# Patient Record
Sex: Female | Born: 1955 | Race: Black or African American | Hispanic: No | Smoking: Current every day smoker
Health system: Southern US, Community
[De-identification: ages and names within clinical notes are randomized; demographics above are authoritative.]

## PROBLEM LIST (undated history)

## (undated) DIAGNOSIS — E785 Hyperlipidemia, unspecified: Secondary | ICD-10-CM

## (undated) DIAGNOSIS — I10 Essential (primary) hypertension: Secondary | ICD-10-CM

## (undated) DIAGNOSIS — Z9289 Personal history of other medical treatment: Secondary | ICD-10-CM

## (undated) DIAGNOSIS — M479 Spondylosis, unspecified: Secondary | ICD-10-CM

## (undated) DIAGNOSIS — Z970 Presence of artificial eye: Secondary | ICD-10-CM

## (undated) DIAGNOSIS — E119 Type 2 diabetes mellitus without complications: Secondary | ICD-10-CM

## (undated) DIAGNOSIS — M47815 Spondylosis without myelopathy or radiculopathy, thoracolumbar region: Secondary | ICD-10-CM

## (undated) DIAGNOSIS — K219 Gastro-esophageal reflux disease without esophagitis: Secondary | ICD-10-CM

## (undated) HISTORY — PX: APPENDECTOMY: SHX54

## (undated) HISTORY — DX: Presence of artificial eye: Z97.0

## (undated) HISTORY — PX: COLONOSCOPY: SHX174

## (undated) HISTORY — PX: EYE SURGERY: SHX253

## (undated) HISTORY — PX: LAPAROSCOPIC CHOLECYSTECTOMY: SUR755

## (undated) HISTORY — PX: DILATION AND CURETTAGE OF UTERUS: SHX78

## (undated) HISTORY — PX: SHOULDER ARTHROSCOPY WITH ROTATOR CUFF REPAIR: SHX5685

---
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
## 1998-11-20 ENCOUNTER — Emergency Department (HOSPITAL_COMMUNITY): Admission: EM | Admit: 1998-11-20 | Discharge: 1998-11-20 | Payer: Self-pay | Admitting: Emergency Medicine

## 1998-11-21 ENCOUNTER — Encounter: Payer: Self-pay | Admitting: Emergency Medicine

## 1999-03-10 ENCOUNTER — Emergency Department (HOSPITAL_COMMUNITY): Admission: EM | Admit: 1999-03-10 | Discharge: 1999-03-10 | Payer: Self-pay | Admitting: Emergency Medicine

## 1999-04-02 ENCOUNTER — Emergency Department (HOSPITAL_COMMUNITY): Admission: EM | Admit: 1999-04-02 | Discharge: 1999-04-02 | Payer: Self-pay | Admitting: Emergency Medicine

## 2000-03-03 ENCOUNTER — Encounter: Admission: RE | Admit: 2000-03-03 | Discharge: 2000-04-07 | Payer: Self-pay | Admitting: Orthopedic Surgery

## 2001-06-18 ENCOUNTER — Emergency Department (HOSPITAL_COMMUNITY): Admission: EM | Admit: 2001-06-18 | Discharge: 2001-06-18 | Payer: Self-pay | Admitting: Emergency Medicine

## 2001-10-05 ENCOUNTER — Emergency Department (HOSPITAL_COMMUNITY): Admission: EM | Admit: 2001-10-05 | Discharge: 2001-10-05 | Payer: Self-pay | Admitting: Emergency Medicine

## 2001-10-10 ENCOUNTER — Emergency Department (HOSPITAL_COMMUNITY): Admission: EM | Admit: 2001-10-10 | Discharge: 2001-10-10 | Payer: Self-pay | Admitting: Emergency Medicine

## 2003-05-04 ENCOUNTER — Encounter: Payer: Self-pay | Admitting: Emergency Medicine

## 2003-05-04 ENCOUNTER — Emergency Department (HOSPITAL_COMMUNITY): Admission: EM | Admit: 2003-05-04 | Discharge: 2003-05-04 | Payer: Self-pay | Admitting: Emergency Medicine

## 2005-03-12 ENCOUNTER — Inpatient Hospital Stay (HOSPITAL_COMMUNITY): Admission: AD | Admit: 2005-03-12 | Discharge: 2005-03-13 | Payer: Self-pay | Admitting: Internal Medicine

## 2005-03-12 ENCOUNTER — Encounter: Payer: Self-pay | Admitting: Emergency Medicine

## 2005-05-20 ENCOUNTER — Emergency Department (HOSPITAL_COMMUNITY): Admission: EM | Admit: 2005-05-20 | Discharge: 2005-05-21 | Payer: Self-pay | Admitting: Emergency Medicine

## 2006-02-08 ENCOUNTER — Emergency Department (HOSPITAL_COMMUNITY): Admission: EM | Admit: 2006-02-08 | Discharge: 2006-02-08 | Payer: Self-pay | Admitting: Emergency Medicine

## 2007-07-03 ENCOUNTER — Inpatient Hospital Stay (HOSPITAL_COMMUNITY): Admission: EM | Admit: 2007-07-03 | Discharge: 2007-07-04 | Payer: Self-pay | Admitting: Emergency Medicine
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
## 2011-04-19 NOTE — Consult Note (Signed)
NAMEBULAR, HICKOK NO.:  [id number]   MEDICAL RECORD NO.:  [id number]          PATIENT TYPE:  INP   LOCATION:  1437                         FACILITY:  [facility]   PHYSICIAN:  Antonietta Breach, M.D.  DATE OF BIRTH:  13-May-1956   DATE OF CONSULTATION:  07/03/2007  DATE OF DISCHARGE:                                 CONSULTATION   HISTORY OF PRESENT ILLNESS:  [name] is a 55 year old female  admitted to the [facility] on the 28th of July 2008 after an  overdose.   The patient states that she drank several beers.  Had argument with her  boyfriend.  She then ate a piece of cocaine and drink Chlortox.   After getting over her intoxication and spending time in the hospital  today, she looks back with a sense of disbelief that she actually  behaved in such a fashion.  She has stated that she must have been  trying to kill herself because of her behavior and the fact that the  overdose occurred; however, she realizes that she was irrational and  impulsive at the time due to intoxication.   The patient currently describes constructive goals and future interests.  She has no thoughts of harming herself or others.  She has no delusions  or hallucinations.  She has had some discussions with her ex-boyfriend  on the phone today.  They have ended their relationship, and she is at  peace about that.   PAST PSYCHIATRIC HISTORY:  The patient does have a history of former  alcohol abuse.  She has been combative and intoxicated when under the  influence of alcohol including a documented episode in the past medical  record in April 2006.   FAMILY PSYCHIATRIC HISTORY:  None.   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  She stopped working as  an Astronomer.  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  She does have a history of using crack about once a month.  She  states she drinks about a 24 oz can of beer a day on the average.  She  is widowed.   PAST MEDICAL HISTORY:  Peptic ulcer disease, history of seizures, carpal  tunnel syndrome, right eye blindness due to trauma at the age of [age].   ALLERGIES:  PENICILLIN, ASPIRIN, SULFA.   MEDICATIONS:  MAR is reviewed.  The patient is on thiamine, a  multivitamin, folic acid.   REVIEW OF SYSTEMS:  Noncontributory.   MENTAL STATUS EXAM:  Shari [name] is alert and oriented to all spheres.  Her memory is intact to immediate recent and remote.  Her affect is  broad and appropriate; mood is within normal limits.  Speech is normal.  Thought process logical, coherent, goal-directed.  No looseness of  associations.  Thought content:  No thoughts of harming herself, no  thoughts of harming others, no delusions, no hallucinations.  Her  judgment is intact.  Her insight is intact.  ASSESSMENT:  AXIS I:  Adjustment disorder with mixed disturbance of  emotions and conduct, alcohol dependence without physiologic dependence,  rule out polysubstance dependence.  AXIS II:  Deferred.  AXIS III:  See general medical problems.  AXIS IV:  Primary support group.  AXIS V:  55.   Shari [name] is not at risk to harm herself or others.  She agrees to call  emergency services immediately for any thoughts of harming herself,  thoughts of harming others or distress.   The patient declines a CD inpatient residential rehabilitation  recommendation, and she is not committable.  She wants to follow up at  [facility] outpatient program [place].   RECOMMENDATIONS:  1. Outpatient ADS.  2. 12-step groups.  If the patient changes her mind about CD      inpatient, would contact the [place] program downtown.      Antonietta Breach, M.D.  Electronically Signed     JW/MEDQ  D:  07/03/2007  T:  07/04/2007  Job:  [id number]

## 2011-04-19 NOTE — Discharge Summary (Signed)
[name], [name]                 ACCOUNT NO.:  [id number]   MEDICAL RECORD NO.:  [id number]          PATIENT TYPE:  INP   LOCATION:  1437                         FACILITY:  [facility]   PHYSICIAN:  Wilson Singer, M.D.DATE OF BIRTH:  Jul 15, 1956   DATE OF ADMISSION:  07/02/2007  DATE OF DISCHARGE:  07/04/2007                               DISCHARGE SUMMARY   FINAL DIAGNOSES:  1. Suicide attempt.  2. History of seizure disorder.  3. Microcytic anemia, needs further investigation.  4. Alcohol abuse.   MEDICATIONS ON DISCHARGE:  Ferrous sulfate 325 mg b.i.d.   CONDITION ON DISCHARGE:  Stable.   HISTORY:  This 55 year old lady was admitted, having taken a rock of  crack cocaine together with alcohol and Clorox. Please see initial  history and physical examination done by Dr. Michaelyn Barter.   HOSPITAL PROGRESS:  As far as her overdose was concerned, she was  clinically and medically stable. She did not have any adverse  consequences, and she was keep on intravenous fluids and became less  drowsy as time went on throughout her hospitalization. It was noted,  however, that she did have a microcytic anemia with a hemoglobin of 8  with a MCV of 67. Hemoccult stools were not available at the time of  discharge, and iron studies have not been done. She was seen by the  psychiatrist, Dr. Jeanie Sewer, who felt that she does have polysubstance  dependence, but she was not suicidal, and he cleared her for discharge  and gave her followup measures.   On physical examination today, she looks well. Temperature 98.3, blood  pressure 119/59, pulse 79, saturation 100% on room air. There were no  new physical findings. The patient does look pale.   Further disposition, she can be discharged home. [name] not actively  bleeding, but she does have a microcytic anemia which requires  investigating, probably initially with a colonoscopy. I will defer this  to Dr. Lonia Blood who will hopefully be her  new primary care physician,  and I have given the patient his name.      Wilson Singer, M.D.  Electronically Signed     NCG/MEDQ  D:  07/04/2007  T:  07/05/2007  Job:  [id number]   cc:   Lonia Blood, M.D.
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
## 2011-09-19 LAB — CBC
HCT: 23.9 — ABNORMAL LOW
HCT: 27.3 — ABNORMAL LOW
Hemoglobin: 8 — ABNORMAL LOW
Hemoglobin: 8.9 — ABNORMAL LOW
MCHC: 32.6
MCHC: 33.3
MCV: 67 — ABNORMAL LOW
MCV: 67.4 — ABNORMAL LOW
Platelets: 326
RBC: 3.57 — ABNORMAL LOW
RBC: 4.05
RDW: 20.7 — ABNORMAL HIGH
WBC: 4.8
WBC: 6.1

## 2011-09-19 LAB — PROTIME-INR
INR: 1.1
Prothrombin Time: 14.1

## 2011-09-19 LAB — DIFFERENTIAL
Basophils Absolute: 0
Basophils Relative: 0
Eosinophils Absolute: 0.1
Eosinophils Relative: 1
Lymphocytes Relative: 28
Lymphs Abs: 1.7
Monocytes Absolute: 0.5
Monocytes Relative: 8
Neutro Abs: 3.8
Neutrophils Relative %: 63

## 2011-09-19 LAB — COMPREHENSIVE METABOLIC PANEL
AST: 18
AST: 20
Albumin: 3.3 — ABNORMAL LOW
BUN: 1 — ABNORMAL LOW
CO2: 22
CO2: 23
Calcium: 8.5
Chloride: 103
Chloride: 107
Creatinine, Ser: 0.47
Creatinine, Ser: 0.55
GFR calc Af Amer: >60
GFR calc Af Amer: >60
GFR calc non Af Amer: >60
GFR calc non Af Amer: >60
Glucose, Bld: 96
Total Bilirubin: 0.5
Total Bilirubin: 0.6

## 2011-09-19 LAB — COMPREHENSIVE METABOLIC PANEL WITH GFR
ALT: 19
Alkaline Phosphatase: 68
Glucose, Bld: 105 — ABNORMAL HIGH
Potassium: 3.2 — ABNORMAL LOW
Sodium: 135
Total Protein: 6.6

## 2011-09-19 LAB — URINALYSIS, ROUTINE W REFLEX MICROSCOPIC
Bilirubin Urine: NEGATIVE
Glucose, UA: NEGATIVE
Ketones, ur: NEGATIVE
Nitrite: NEGATIVE
Protein, ur: NEGATIVE
Specific Gravity, Urine: 1.006
Urobilinogen, UA: 0.2
pH: 6.5

## 2011-09-19 LAB — PREALBUMIN: Prealbumin: 28.8

## 2011-09-19 LAB — CK TOTAL AND CKMB (NOT AT ARMC)
CK, MB: 1.7
Relative Index: 1.1
Total CK: 161

## 2011-09-19 LAB — RAPID URINE DRUG SCREEN, HOSP PERFORMED
Amphetamines: NOT DETECTED
Benzodiazepines: NOT DETECTED
Tetrahydrocannabinol: NOT DETECTED

## 2011-09-19 LAB — ACETAMINOPHEN LEVEL: Acetaminophen (Tylenol), Serum: <10 — ABNORMAL LOW

## 2011-09-19 LAB — URINE MICROSCOPIC-ADD ON

## 2011-09-19 LAB — ETHANOL: Alcohol, Ethyl (B): 179 — ABNORMAL HIGH

## 2011-09-19 LAB — TROPONIN I: Troponin I: 0.02

## 2011-09-19 LAB — HEMOGLOBIN AND HEMATOCRIT, BLOOD: HCT: 24.1 — ABNORMAL LOW

## 2011-09-19 LAB — SALICYLATE LEVEL: Salicylate Lvl: <4

## 2015-02-20 NOTE — Patient Instructions (Addendum)
   Your procedure is scheduled on:  Wednesday, March 23  [facility] at:  6 AM Pick up the phone at the desk and dial [phone] and inform us of your arrival.  Please call this number if you have any problems the morning of surgery: [phone]  Remember: Do not eat or drink after midnight: Tuesday Take these medicines the morning of surgery with a SIP OF WATER:  NONE  Do not wear jewelry, make-up, or FINGER nail polish No metal in your hair or on your body. Do not wear lotions, powders, perfumes.  You may wear deodorant.  Do not bring valuables to the hospital. Contacts, dentures or bridgework may not be worn into surgery.  Patients discharged on the day of surgery will not be allowed to drive home.  Home with sister [name] cell [phone] or Niece Lynford HumphreyShanda cell [phone]

## 2015-02-23 ENCOUNTER — Encounter (HOSPITAL_COMMUNITY): Payer: Self-pay

## 2015-02-23 ENCOUNTER — Encounter (HOSPITAL_COMMUNITY)
Admission: RE | Admit: 2015-02-23 | Discharge: 2015-02-23 | Disposition: A | Discharge status: Home / Self Care | Payer: Commercial Managed Care - HMO | Source: Ambulatory Visit | Attending: Obstetrics and Gynecology | Admitting: Obstetrics and Gynecology

## 2015-02-23 DIAGNOSIS — Z9089 Acquired absence of other organs: Secondary | ICD-10-CM | POA: Diagnosis not present

## 2015-02-23 DIAGNOSIS — F17200 Nicotine dependence, unspecified, uncomplicated: Secondary | ICD-10-CM | POA: Diagnosis not present

## 2015-02-23 DIAGNOSIS — N840 Polyp of corpus uteri: Secondary | ICD-10-CM | POA: Diagnosis not present

## 2015-02-23 DIAGNOSIS — Z9049 Acquired absence of other specified parts of digestive tract: Secondary | ICD-10-CM | POA: Diagnosis not present

## 2015-02-23 DIAGNOSIS — N950 Postmenopausal bleeding: Secondary | ICD-10-CM | POA: Diagnosis present

## 2015-02-23 HISTORY — DX: Hyperlipidemia, unspecified: E78.5

## 2015-02-23 LAB — CBC
HCT: 40.8 % (ref 36.0–46.0)
Hemoglobin: 13.5 g/dL (ref 12.0–15.0)
MCH: 28.5 pg (ref 26.0–34.0)
MCHC: 33.1 g/dL (ref 30.0–36.0)
MCV: 86.1 fL (ref 78.0–100.0)
PLATELETS: 293 10*3/uL (ref 150–400)
RBC: 4.74 MIL/uL (ref 3.87–5.11)
RDW: 13.6 % (ref 11.5–15.5)
WBC: 7.9 10*3/uL (ref 4.0–10.5)

## 2015-02-24 NOTE — H&P (Addendum)
Albin FellingBetty L Palmatier is an 59 y.o. female. Presents for surgical evaluation and management of PMB     No LMP recorded.    Past Medical History  Diagnosis Date  . SVD (spontaneous vaginal delivery)     x 1, baby died at 2 wks of age  . Hyperlipidemia     Past Surgical History  Procedure Laterality Date  . Eye surgery      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6, 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  . Appendectomy    . Cholecystectomy    . Right shoulder surgery      rotator cuff repair  . Left shoulder surgery      rotator cuff repair, wire stitches per patient  . Colonoscopy      No family history on file.  Social History:  reports that she has been smoking.  She has never used smokeless tobacco. She reports that she does not drink alcohol or use illicit drugs.  Allergies:  Allergies  Allergen Reactions  . Aspirin Hives and Itching  . Penicillins Hives and Itching  . Sulfa Antibiotics Hives and Itching    No prescriptions prior to admission    ROS  AF, VSS  Physical Exam Gen - NAD Abd - soft, NT/ND CV - RRR Lungs - clear Ext - NT PV - uterus enlarged, mobile, NT.  No adnexal masses  US - large submucus fibroid Embx - b9 but only endocervical tissue  Assessment/Plan:  PMB Hysteroscopy D&C, possible myomectomy R/b/a and plan of care reviewed.  Questions answered, informed consent Yatzari Jonsson 02/24/2015, 9:35 PM

## 2015-02-25 ENCOUNTER — Encounter (HOSPITAL_COMMUNITY)
Admission: AD | Disposition: A | Discharge status: Home / Self Care | Payer: Self-pay | Source: Ambulatory Visit | Attending: Obstetrics and Gynecology

## 2015-02-25 ENCOUNTER — Ambulatory Visit (HOSPITAL_COMMUNITY): Payer: Commercial Managed Care - HMO | Admitting: Anesthesiology

## 2015-02-25 ENCOUNTER — Ambulatory Visit (HOSPITAL_COMMUNITY)
Admission: AD | Admit: 2015-02-25 | Discharge: 2015-02-25 | Disposition: A | Discharge status: Home / Self Care | Payer: Commercial Managed Care - HMO | Source: Ambulatory Visit | Attending: Obstetrics and Gynecology | Admitting: Obstetrics and Gynecology

## 2015-02-25 ENCOUNTER — Encounter (HOSPITAL_COMMUNITY): Payer: Self-pay | Admitting: Anesthesiology

## 2015-02-25 DIAGNOSIS — Z9049 Acquired absence of other specified parts of digestive tract: Secondary | ICD-10-CM | POA: Diagnosis not present

## 2015-02-25 DIAGNOSIS — N840 Polyp of corpus uteri: Secondary | ICD-10-CM | POA: Diagnosis not present

## 2015-02-25 DIAGNOSIS — Z9089 Acquired absence of other organs: Secondary | ICD-10-CM | POA: Diagnosis not present

## 2015-02-25 DIAGNOSIS — F17200 Nicotine dependence, unspecified, uncomplicated: Secondary | ICD-10-CM | POA: Insufficient documentation

## 2015-02-25 HISTORY — PX: DILATATION & CURETTAGE/HYSTEROSCOPY WITH MYOSURE: SHX6511

## 2015-02-25 SURGERY — DILATATION & CURETTAGE/HYSTEROSCOPY WITH MYOSURE
Anesthesia: General | Site: Uterus

## 2015-02-25 MED ORDER — LIDOCAINE HCL (CARDIAC) 20 MG/ML IV SOLN
INTRAVENOUS | Status: DC | PRN
  Administered 2015-02-25: 60 mg via INTRAVENOUS

## 2015-02-25 MED ORDER — MIDAZOLAM HCL 2 MG/2ML IJ SOLN
INTRAMUSCULAR | Status: DC | PRN
  Administered 2015-02-25: 1 mg via INTRAVENOUS

## 2015-02-25 MED ORDER — KETOROLAC TROMETHAMINE 30 MG/ML IJ SOLN
INTRAMUSCULAR | Status: AC
  Filled 2015-02-25: qty 1

## 2015-02-25 MED ORDER — OXYCODONE-ACETAMINOPHEN 5-325 MG PO TABS: 1.0000 | ORAL_TABLET | ORAL | Status: DC | PRN

## 2015-02-25 MED ORDER — ACETAMINOPHEN 325 MG PO TABS: 650.0000 mg | ORAL_TABLET | Freq: Once | ORAL | Status: DC

## 2015-02-25 MED ORDER — FENTANYL CITRATE 0.05 MG/ML IJ SOLN: 25.0000 ug | INTRAMUSCULAR | Status: DC | PRN

## 2015-02-25 MED ORDER — OXYCODONE HCL 5 MG PO TABS
5.0000 mg | ORAL_TABLET | Freq: Once | ORAL | Status: AC
Start: 2015-02-25 — End: 2015-02-25
  Administered 2015-02-25: 5 mg via ORAL

## 2015-02-25 MED ORDER — LIDOCAINE HCL (CARDIAC) 20 MG/ML IV SOLN
INTRAVENOUS | Status: AC
  Filled 2015-02-25: qty 5

## 2015-02-25 MED ORDER — PROPOFOL 10 MG/ML IV BOLUS
INTRAVENOUS | Status: DC | PRN
  Administered 2015-02-25: 40 mg via INTRAVENOUS

## 2015-02-25 MED ORDER — FENTANYL CITRATE 0.05 MG/ML IJ SOLN
INTRAMUSCULAR | Status: DC | PRN
  Administered 2015-02-25 (×4): 50 ug via INTRAVENOUS

## 2015-02-25 MED ORDER — SCOPOLAMINE 1 MG/3DAYS TD PT72
MEDICATED_PATCH | TRANSDERMAL | Status: AC
  Filled 2015-02-25: qty 1

## 2015-02-25 MED ORDER — MIDAZOLAM HCL 2 MG/2ML IJ SOLN
INTRAMUSCULAR | Status: AC
  Filled 2015-02-25: qty 2

## 2015-02-25 MED ORDER — DEXAMETHASONE SODIUM PHOSPHATE 4 MG/ML IJ SOLN
INTRAMUSCULAR | Status: DC | PRN
  Administered 2015-02-25: 4 mg via INTRAVENOUS

## 2015-02-25 MED ORDER — ACETAMINOPHEN 10 MG/ML IV SOLN
1000.0000 mg | Freq: Once | INTRAVENOUS | Status: AC
  Administered 2015-02-25: 1000 mg via INTRAVENOUS
  Filled 2015-02-25: qty 100

## 2015-02-25 MED ORDER — ONDANSETRON HCL 4 MG/2ML IJ SOLN
INTRAMUSCULAR | Status: DC | PRN
  Administered 2015-02-25: 4 mg via INTRAVENOUS

## 2015-02-25 MED ORDER — OXYCODONE HCL 5 MG PO TABS
ORAL_TABLET | ORAL | Status: AC
  Filled 2015-02-25: qty 1

## 2015-02-25 MED ORDER — SCOPOLAMINE 1 MG/3DAYS TD PT72
1.0000 | MEDICATED_PATCH | Freq: Once | TRANSDERMAL | Status: DC
  Administered 2015-02-25: 1.5 mg via TRANSDERMAL

## 2015-02-25 MED ORDER — PROPOFOL 10 MG/ML IV BOLUS
INTRAVENOUS | Status: AC
  Filled 2015-02-25: qty 20

## 2015-02-25 MED ORDER — SODIUM CHLORIDE 0.9 % IR SOLN
Status: DC | PRN
  Administered 2015-02-25: 3000 mL

## 2015-02-25 MED ORDER — FENTANYL CITRATE 0.05 MG/ML IJ SOLN
INTRAMUSCULAR | Status: AC
  Filled 2015-02-25: qty 5

## 2015-02-25 MED ORDER — LIDOCAINE HCL 1 % IJ SOLN
INTRAMUSCULAR | Status: AC
  Filled 2015-02-25: qty 20

## 2015-02-25 MED ORDER — PHENYLEPHRINE HCL 10 MG/ML IJ SOLN
INTRAMUSCULAR | Status: DC | PRN
  Administered 2015-02-25 (×2): 80 ug via INTRAVENOUS

## 2015-02-25 MED ORDER — LACTATED RINGERS IV SOLN
INTRAVENOUS | Status: DC
  Administered 2015-02-25 (×2): via INTRAVENOUS

## 2015-02-25 MED ORDER — DEXAMETHASONE SODIUM PHOSPHATE 4 MG/ML IJ SOLN
INTRAMUSCULAR | Status: AC
  Filled 2015-02-25: qty 1

## 2015-02-25 MED ORDER — ONDANSETRON HCL 4 MG/2ML IJ SOLN
INTRAMUSCULAR | Status: AC
  Filled 2015-02-25: qty 2

## 2015-02-25 MED ORDER — LIDOCAINE HCL 1 % IJ SOLN
INTRAMUSCULAR | Status: DC | PRN
Start: 2015-02-25 — End: 2015-02-25

## 2015-02-25 SURGICAL SUPPLY — 25 items
ABLATOR ENDOMETRIAL BIPOLAR (ABLATOR) IMPLANT
CANISTER SUCT 3000ML (MISCELLANEOUS) ×5 IMPLANT
CATH ROBINSON RED A/P 16FR (CATHETERS) ×3 IMPLANT
CATH THERMACHOICE III (CATHETERS) IMPLANT
CLOTH BEACON ORANGE TIMEOUT ST (SAFETY) ×3 IMPLANT
CONTAINER PREFILL 10% NBF 60ML (FORM) ×6 IMPLANT
DEVICE MYOSURE CLASSIC (MISCELLANEOUS) IMPLANT
DEVICE MYOSURE LITE (MISCELLANEOUS) ×2 IMPLANT
ELECT REM PT RETURN 9FT ADLT (ELECTROSURGICAL) ×3
ELECTRODE REM PT RTRN 9FT ADLT (ELECTROSURGICAL) ×1 IMPLANT
FILTER ARTHROSCOPY CONVERTOR (FILTER) ×3 IMPLANT
GLOVE BIO SURGEON STRL SZ 6.5 (GLOVE) ×2 IMPLANT
GLOVE BIO SURGEONS STRL SZ 6.5 (GLOVE) ×1
GLOVE BIOGEL PI IND STRL 7.0 (GLOVE) ×1 IMPLANT
GLOVE BIOGEL PI INDICATOR 7.0 (GLOVE) ×4
GLOVE SURG SS PI 7.0 STRL IVOR (GLOVE) ×2 IMPLANT
GOWN STRL REUS W/TWL LRG LVL3 (GOWN DISPOSABLE) ×6 IMPLANT
LOOP ANGLED CUTTING 22FR (CUTTING LOOP) ×1 IMPLANT
PACK VAGINAL MINOR WOMEN LF (CUSTOM PROCEDURE TRAY) ×3 IMPLANT
PAD OB MATERNITY 4.3X12.25 (PERSONAL CARE ITEMS) ×3 IMPLANT
SEAL ROD LENS SCOPE MYOSURE (ABLATOR) ×3 IMPLANT
TOWEL OR 17X24 6PK STRL BLUE (TOWEL DISPOSABLE) ×6 IMPLANT
TUBING AQUILEX INFLOW (TUBING) ×3 IMPLANT
TUBING AQUILEX OUTFLOW (TUBING) ×3 IMPLANT
WATER STERILE IRR 1000ML POUR (IV SOLUTION) ×3 IMPLANT

## 2015-02-25 NOTE — Anesthesia Postprocedure Evaluation (Signed)
  Anesthesia Post-op Note  Patient: Shari FellingBetty L [name]  Procedure(s) Performed: Procedure(s): DILATATION & CURETTAGE/HYSTEROSCOPY WITH MYOSURE (N/A) Patient is awake and responsive. Pain and nausea are reasonably well controlled. Vital signs are stable and clinically acceptable. Oxygen saturation is clinically acceptable. There are no apparent anesthetic complications at this time. Patient is ready for discharge.

## 2015-02-25 NOTE — Op Note (Signed)
NAMKathrine Haddock:  [name], Shari [name]                 ACCOUNT NO.:  [id number]  MEDICAL RECORD NO.:  [id number]  LOCATION:  [facility]                          FACILITY:  [facility]  PHYSICIAN:  Zelphia CairoGretchen Merikay Lesniewski, MD    DATE OF BIRTH:  August 04, 1956  DATE OF PROCEDURE:  02/25/2015 DATE OF DISCHARGE:  02/25/2015                              OPERATIVE REPORT   PREOPERATIVE DIAGNOSIS:  Postmenopausal bleeding.  POSTOPERATIVE DIAGNOSIS:  Postmenopausal bleeding, pathology pending.  PROCEDURE: 1. Hysteroscopy, D and C. 2. Resection of endometrial mass using MyoSure.  SURGEON:  Zelphia CairoGretchen Annet Manukyan, MD  ANESTHESIA:  General.  COMPLICATIONS:  None.  SPECIMENS:  Endometrial curettings with polyps.  CONDITION:  Stable to recovery room.  PROCEDURE:  The patient was taken to the operating room, where general anesthesia was obtained.  She was placed in dorsal lithotomy position using Allen stirrups.  She was prepped and draped in sterile fashion and an in and out catheter was used to drain her bladder for an unmeasured amount of clear urine.  Bivalve speculum was placed in the vagina. Because of a very large fibroid, her cervix is very ill defined.  Upon palpation, it was identified and grasped with a single-tooth tenaculum. The cervix was then dilated under direct visualization.  Using the hysteroscope.  Once in the cavity, polypoid mass was noted on the posterior wall of the uterus.  There were some fluffy endometrium throughout, no other masses identified.  The MyoSure LITE was introduced through the hysteroscope, and the polyp was resected to the base.  The remainder of the cavity was sampled using the MyoSure.  The instruments were then removed.  A curetting was attempted, however, I was unable to pass a curette blindly through her cervix because of her fibroids distorting the cavity.  All instruments were then removed.  Tenaculum was removed from her cervix. Her cervix was hemostatic.  Speculum was removed.  She was  extubated and taken to the recovery room in stable condition.  Sponge, lap, needle, and instrument counts were correct x2.     Zelphia CairoGretchen Lakecia Deschamps, MD     GA/MEDQ  D:  02/25/2015  T:  02/25/2015  Job:  [id number]

## 2015-02-25 NOTE — Anesthesia Procedure Notes (Signed)
Date/Time: 02/25/2015 7:32 AM Performed by: Graciela HusbandsFUSSELL, Faigy Stretch O Pre-anesthesia Checklist: Patient identified, Emergency Drugs available, Suction available, Patient being monitored and Timeout performed Patient Re-evaluated:Patient Re-evaluated prior to inductionOxygen Delivery Method: Circle system utilized Preoxygenation: Pre-oxygenation with 100% oxygen Intubation Type: IV induction LMA: LMA inserted LMA Size: 4.0

## 2015-02-25 NOTE — Anesthesia Preprocedure Evaluation (Signed)
Anesthesia Evaluation  Patient identified by MRN, date of birth, ID band Patient awake    Reviewed: Allergy & Precautions, H&P , Patient's Chart, lab work & pertinent test results, reviewed documented beta blocker date and time   History of Anesthesia Complications Negative for: history of anesthetic complications  Airway Mallampati: II TM Distance: >3 FB Neck ROM: full    Dental   Pulmonary Current Smoker,  breath sounds clear to auscultation        Cardiovascular Exercise Tolerance: Good Rhythm:regular Rate:Normal     Neuro/Psych negative psych ROS   GI/Hepatic   Endo/Other    Renal/GU      Musculoskeletal   Abdominal   Peds  Hematology   Anesthesia Other Findings   Reproductive/Obstetrics                           Anesthesia Physical Anesthesia Plan  ASA: II  Anesthesia Plan: General LMA   Post-op Pain Management:    Induction:   Airway Management Planned:   Additional Equipment:   Intra-op Plan:   Post-operative Plan:   Informed Consent: I have reviewed the patients History and Physical, chart, labs and discussed the procedure including the risks, benefits and alternatives for the proposed anesthesia with the patient or authorized representative who has indicated his/her understanding and acceptance.   Dental Advisory Given  Plan Discussed with: CRNA, Surgeon and Anesthesiologist  Anesthesia Plan Comments:         Anesthesia Quick Evaluation  

## 2015-02-25 NOTE — Discharge Instructions (Signed)
DISCHARGE INSTRUCTIONS: HYSTEROSCOPY / ENDOMETRIAL ABLATION The following instructions have been prepared to help you care for yourself upon your return home.  May take stool softner while taking narcotic pain medication to prevent constipation.  Drink plenty of water.  Personal hygiene:  Use sanitary pads for vaginal drainage, not tampons.  Shower the day after your procedure.  NO tub baths, pools or Jacuzzis for 2-3 weeks.  Wipe front to back after using the bathroom.  Activity and limitations:  Do NOT drive or operate any equipment for 24 hours. The effects of anesthesia are still present and drowsiness may result.  Do NOT rest in bed all day.  Walking is encouraged.  Walk up and down stairs slowly.  You may resume your normal activity in one to two days or as indicated by your physician.  Sexual activity: NO intercourse for at least 2 weeks after the procedure, or as indicated by your Doctor.  Diet: Eat a light meal as desired this evening. You may resume your usual diet tomorrow.  Return to Work: You may resume your work activities in one to two days or as indicated by Therapist, sportsyour Doctor.  What to expect after your surgery: Expect to have vaginal bleeding/discharge for 2-3 days and spotting for up to 10 days. It is not unusual to have soreness for up to 1-2 weeks. You may have a slight burning sensation when you urinate for the first day. Mild cramps may continue for a couple of days. You may have a regular period in 2-6 weeks.  Call your doctor for any of the following:  Excessive vaginal bleeding or clotting, saturating and changing one pad every hour.  Inability to urinate 6 hours after discharge from hospital.  Pain not relieved by pain medication.  Fever of 100.4 F or greater.  Unusual vaginal discharge or odor.   Post Anesthesia Care Unit [phone]

## 2015-02-25 NOTE — Transfer of Care (Signed)
Immediate Anesthesia Transfer of Care Note  Patient: Shari FellingBetty L Baggett  Procedure(s) Performed: Procedure(s): DILATATION & CURETTAGE/HYSTEROSCOPY WITH MYOSURE (N/A)  Patient Location: PACU  Anesthesia Type:General  Level of Consciousness: awake, alert  and oriented  Airway & Oxygen Therapy: Patient Spontanous Breathing and Patient connected to nasal cannula oxygen  Post-op Assessment: Report given to RN and Post -op Vital signs reviewed and stable  Post vital signs: Reviewed and stable  Last Vitals:  Filed Vitals:   02/25/15 0555  BP: 144/71  Pulse: 77  Temp: 36.6 C  Resp: 20    Complications: No apparent anesthesia complications

## 2015-02-27 ENCOUNTER — Encounter (HOSPITAL_COMMUNITY): Payer: Self-pay | Admitting: Obstetrics and Gynecology

## 2016-11-22 DIAGNOSIS — E785 Hyperlipidemia, unspecified: Secondary | ICD-10-CM | POA: Diagnosis not present

## 2016-11-22 DIAGNOSIS — E119 Type 2 diabetes mellitus without complications: Secondary | ICD-10-CM | POA: Diagnosis not present

## 2016-11-22 DIAGNOSIS — I10 Essential (primary) hypertension: Secondary | ICD-10-CM | POA: Diagnosis not present

## 2016-12-26 DIAGNOSIS — R202 Paresthesia of skin: Secondary | ICD-10-CM | POA: Diagnosis not present

## 2016-12-26 DIAGNOSIS — B353 Tinea pedis: Secondary | ICD-10-CM | POA: Diagnosis not present

## 2016-12-28 DIAGNOSIS — R202 Paresthesia of skin: Secondary | ICD-10-CM | POA: Diagnosis not present

## 2017-01-10 DIAGNOSIS — H40053 Ocular hypertension, bilateral: Secondary | ICD-10-CM | POA: Diagnosis not present

## 2017-01-18 DIAGNOSIS — E785 Hyperlipidemia, unspecified: Secondary | ICD-10-CM | POA: Diagnosis not present

## 2017-01-18 DIAGNOSIS — I10 Essential (primary) hypertension: Secondary | ICD-10-CM | POA: Diagnosis not present

## 2017-01-18 DIAGNOSIS — R202 Paresthesia of skin: Secondary | ICD-10-CM | POA: Diagnosis not present

## 2017-01-18 DIAGNOSIS — R200 Anesthesia of skin: Secondary | ICD-10-CM | POA: Diagnosis not present

## 2017-03-14 ENCOUNTER — Encounter (HOSPITAL_COMMUNITY): Payer: Self-pay | Admitting: *Deleted

## 2017-03-14 NOTE — Progress Notes (Signed)
Shari [name]  Denies chest pain or shortness of breath or palpations. Shari [name] reports that she checks fasting CBG, it runs 80- 120.  Shari [name] does not think she has had an EKG in over 17 years and it was in [place].  Patient was seen in [place] in the past and may have had an EKG then.  This could have been 6-8 years ago.  I requested EKG from [facility].  I instructed patient to check CBG to check CBG and if it is less than 70 to treat it with Glucose Gel, Glucose tablets or 1/2 cup of clear juice like apple juice or cranberry juice. I instructed patient to recheck CBG in 15 minutes and if CBG is not greater than 70, to  Call [phone] (pre- op). If it is before pre-op opens to retreat as before and recheck CBG in 15 minutes. I told patient to make note of time that liquid is taken and amount, that surgical time may have to be adjusted.

## 2017-03-16 ENCOUNTER — Encounter (HOSPITAL_COMMUNITY)
Admission: RE | Disposition: A | Discharge status: Home / Self Care | Payer: Self-pay | Source: Ambulatory Visit | Attending: Oculoplastics Ophthalmology

## 2017-03-16 ENCOUNTER — Observation Stay (HOSPITAL_COMMUNITY)
Admission: RE | Admit: 2017-03-16 | Discharge: 2017-03-17 | Disposition: A | Discharge status: Home / Self Care | Payer: Medicare Other | Source: Ambulatory Visit | Attending: Oculoplastics Ophthalmology | Admitting: Oculoplastics Ophthalmology

## 2017-03-16 ENCOUNTER — Encounter (HOSPITAL_COMMUNITY): Payer: Self-pay | Admitting: Certified Registered Nurse Anesthetist

## 2017-03-16 ENCOUNTER — Ambulatory Visit (HOSPITAL_COMMUNITY): Payer: Medicare Other | Admitting: Anesthesiology

## 2017-03-16 DIAGNOSIS — I10 Essential (primary) hypertension: Secondary | ICD-10-CM | POA: Diagnosis not present

## 2017-03-16 DIAGNOSIS — Z419 Encounter for procedure for purposes other than remedying health state, unspecified: Secondary | ICD-10-CM

## 2017-03-16 DIAGNOSIS — Z7984 Long term (current) use of oral hypoglycemic drugs: Secondary | ICD-10-CM | POA: Insufficient documentation

## 2017-03-16 DIAGNOSIS — Z87891 Personal history of nicotine dependence: Secondary | ICD-10-CM | POA: Insufficient documentation

## 2017-03-16 DIAGNOSIS — H5461 Unqualified visual loss, right eye, normal vision left eye: Secondary | ICD-10-CM | POA: Diagnosis present

## 2017-03-16 DIAGNOSIS — Z791 Long term (current) use of non-steroidal anti-inflammatories (NSAID): Secondary | ICD-10-CM | POA: Insufficient documentation

## 2017-03-16 DIAGNOSIS — H5711 Ocular pain, right eye: Secondary | ICD-10-CM | POA: Insufficient documentation

## 2017-03-16 DIAGNOSIS — E119 Type 2 diabetes mellitus without complications: Secondary | ICD-10-CM | POA: Diagnosis not present

## 2017-03-16 DIAGNOSIS — Z79899 Other long term (current) drug therapy: Secondary | ICD-10-CM | POA: Diagnosis not present

## 2017-03-16 HISTORY — DX: Spondylosis, unspecified: M47.9

## 2017-03-16 HISTORY — DX: Personal history of other medical treatment: Z92.89

## 2017-03-16 HISTORY — DX: Gastro-esophageal reflux disease without esophagitis: K21.9

## 2017-03-16 HISTORY — DX: Hyperlipidemia, unspecified: E78.5

## 2017-03-16 HISTORY — DX: Spondylosis without myelopathy or radiculopathy, thoracolumbar region: M47.815

## 2017-03-16 HISTORY — DX: Type 2 diabetes mellitus without complications: E11.9

## 2017-03-16 HISTORY — DX: Essential (primary) hypertension: I10

## 2017-03-16 HISTORY — PX: ENUCLEATION: SHX628

## 2017-03-16 LAB — CBC
HEMATOCRIT: 34.9 % — AB (ref 36.0–46.0)
Hemoglobin: 11.3 g/dL — ABNORMAL LOW (ref 12.0–15.0)
MCH: 27.4 pg (ref 26.0–34.0)
MCHC: 32.4 g/dL (ref 30.0–36.0)
MCV: 84.5 fL (ref 78.0–100.0)
PLATELETS: 297 10*3/uL (ref 150–400)
RBC: 4.13 MIL/uL (ref 3.87–5.11)
RDW: 13.5 % (ref 11.5–15.5)
WBC: 5.5 10*3/uL (ref 4.0–10.5)

## 2017-03-16 LAB — BASIC METABOLIC PANEL
Anion gap: 10 (ref 5–15)
BUN: 8 mg/dL (ref 6–20)
CO2: 25 mmol/L (ref 22–32)
Calcium: 9.8 mg/dL (ref 8.9–10.3)
Chloride: 104 mmol/L (ref 101–111)
Creatinine, Ser: 0.99 mg/dL (ref 0.44–1.00)
GFR calc Af Amer: >60 mL/min (ref >60)
GLUCOSE: 90 mg/dL (ref 65–99)
POTASSIUM: 3.5 mmol/L (ref 3.5–5.1)
Sodium: 139 mmol/L (ref 135–145)

## 2017-03-16 LAB — GLUCOSE, CAPILLARY
GLUCOSE-CAPILLARY: 87 mg/dL (ref 65–99)
Glucose-Capillary: 100 mg/dL — ABNORMAL HIGH (ref 65–99)
Glucose-Capillary: 115 mg/dL — ABNORMAL HIGH (ref 65–99)

## 2017-03-16 SURGERY — ENUCLEATION, EYE
Anesthesia: General | Site: Eye | Laterality: Right

## 2017-03-16 MED ORDER — MORPHINE SULFATE (PF) 2 MG/ML IV SOLN
2.0000 mg | INTRAVENOUS | Status: DC | PRN
  Administered 2017-03-16: 2 mg via INTRAVENOUS
  Filled 2017-03-16: qty 1

## 2017-03-16 MED ORDER — DOCUSATE SODIUM 100 MG PO CAPS
100.0000 mg | ORAL_CAPSULE | Freq: Every day | ORAL | Status: DC
  Administered 2017-03-16 – 2017-03-17 (×2): 100 mg via ORAL
  Filled 2017-03-16 (×2): qty 1

## 2017-03-16 MED ORDER — PROPOFOL 10 MG/ML IV BOLUS
INTRAVENOUS | Status: DC | PRN
  Administered 2017-03-16: 120 mg via INTRAVENOUS
  Administered 2017-03-16: 50 mg via INTRAVENOUS

## 2017-03-16 MED ORDER — FENTANYL CITRATE (PF) 250 MCG/5ML IJ SOLN
INTRAMUSCULAR | Status: AC
  Filled 2017-03-16: qty 5

## 2017-03-16 MED ORDER — LISINOPRIL 10 MG PO TABS
10.0000 mg | ORAL_TABLET | Freq: Every day | ORAL | Status: DC
  Administered 2017-03-16 – 2017-03-17 (×2): 10 mg via ORAL
  Filled 2017-03-16 (×2): qty 1

## 2017-03-16 MED ORDER — OXYCODONE-ACETAMINOPHEN 5-325 MG PO TABS
ORAL_TABLET | ORAL | Status: AC
Start: 2017-03-16 — End: 2017-03-17
  Filled 2017-03-16: qty 2

## 2017-03-16 MED ORDER — ADULT MULTIVITAMIN W/MINERALS CH
1.0000 | ORAL_TABLET | Freq: Every day | ORAL | Status: DC
  Administered 2017-03-16: 1 via ORAL
  Filled 2017-03-16: qty 1

## 2017-03-16 MED ORDER — DEXAMETHASONE SODIUM PHOSPHATE 10 MG/ML IJ SOLN
INTRAMUSCULAR | Status: DC | PRN
  Administered 2017-03-16: 4 mg via INTRAVENOUS

## 2017-03-16 MED ORDER — ACETAMINOPHEN 500 MG PO TABS
1000.0000 mg | ORAL_TABLET | Freq: Four times a day (QID) | ORAL | Status: DC
  Administered 2017-03-16: 1000 mg via ORAL
  Filled 2017-03-16 (×2): qty 2

## 2017-03-16 MED ORDER — PROPARACAINE HCL 0.5 % OP SOLN
1.0000 [drp] | OPHTHALMIC | Status: DC | PRN
  Administered 2017-03-16 (×3): 1 [drp] via OPHTHALMIC
  Filled 2017-03-16: qty 15

## 2017-03-16 MED ORDER — PROMETHAZINE HCL 25 MG/ML IJ SOLN: 6.2500 mg | INTRAMUSCULAR | Status: DC | PRN

## 2017-03-16 MED ORDER — SUGAMMADEX SODIUM 200 MG/2ML IV SOLN
INTRAVENOUS | Status: DC | PRN
  Administered 2017-03-16: 150 mg via INTRAVENOUS

## 2017-03-16 MED ORDER — ROCURONIUM BROMIDE 50 MG/5ML IV SOSY
PREFILLED_SYRINGE | INTRAVENOUS | Status: AC
  Filled 2017-03-16: qty 10

## 2017-03-16 MED ORDER — LIDOCAINE HCL 2 % IJ SOLN
INTRAMUSCULAR | Status: AC
  Filled 2017-03-16: qty 20

## 2017-03-16 MED ORDER — BSS IO SOLN
INTRAOCULAR | Status: AC
  Filled 2017-03-16: qty 15

## 2017-03-16 MED ORDER — LIDOCAINE 2% (20 MG/ML) 5 ML SYRINGE
INTRAMUSCULAR | Status: AC
  Filled 2017-03-16: qty 5

## 2017-03-16 MED ORDER — GENTAMICIN SULFATE 40 MG/ML IJ SOLN
INTRAMUSCULAR | Status: AC
  Filled 2017-03-16: qty 2

## 2017-03-16 MED ORDER — ROCURONIUM BROMIDE 10 MG/ML (PF) SYRINGE
PREFILLED_SYRINGE | INTRAVENOUS | Status: DC | PRN
  Administered 2017-03-16: 40 mg via INTRAVENOUS

## 2017-03-16 MED ORDER — MIDAZOLAM HCL 5 MG/5ML IJ SOLN
INTRAMUSCULAR | Status: DC | PRN
  Administered 2017-03-16 (×2): 1 mg via INTRAVENOUS

## 2017-03-16 MED ORDER — BUPIVACAINE HCL (PF) 0.5 % IJ SOLN
INTRAMUSCULAR | Status: AC
  Filled 2017-03-16: qty 30

## 2017-03-16 MED ORDER — PANTOPRAZOLE SODIUM 20 MG PO TBEC
20.0000 mg | DELAYED_RELEASE_TABLET | Freq: Every day | ORAL | Status: DC
  Administered 2017-03-16 – 2017-03-17 (×2): 20 mg via ORAL
  Filled 2017-03-16 (×2): qty 1

## 2017-03-16 MED ORDER — FENTANYL CITRATE (PF) 100 MCG/2ML IJ SOLN
25.0000 ug | INTRAMUSCULAR | Status: DC | PRN
  Administered 2017-03-16: 100 ug via INTRAVENOUS

## 2017-03-16 MED ORDER — ONDANSETRON 4 MG PO TBDP
4.0000 mg | ORAL_TABLET | Freq: Four times a day (QID) | ORAL | Status: DC
  Administered 2017-03-16 – 2017-03-17 (×2): 4 mg via ORAL
  Filled 2017-03-16 (×2): qty 1

## 2017-03-16 MED ORDER — 0.9 % SODIUM CHLORIDE (POUR BTL) OPTIME
TOPICAL | Status: DC | PRN
  Administered 2017-03-16: 200 mL

## 2017-03-16 MED ORDER — GENTAMICIN SULFATE 40 MG/ML IJ SOLN
INTRAMUSCULAR | Status: DC | PRN
  Administered 2017-03-16: 80 mg

## 2017-03-16 MED ORDER — DEXAMETHASONE SODIUM PHOSPHATE 10 MG/ML IJ SOLN
INTRAMUSCULAR | Status: AC
  Filled 2017-03-16: qty 1

## 2017-03-16 MED ORDER — STERILE WATER FOR IRRIGATION IR SOLN
Status: DC | PRN
  Administered 2017-03-16: 200 mL

## 2017-03-16 MED ORDER — TETRACAINE HCL 0.5 % OP SOLN
OPHTHALMIC | Status: AC
  Filled 2017-03-16: qty 2

## 2017-03-16 MED ORDER — BUPIVACAINE HCL (PF) 0.5 % IJ SOLN
INTRAMUSCULAR | Status: DC | PRN
  Administered 2017-03-16: 30 mL

## 2017-03-16 MED ORDER — HYPROMELLOSE (GONIOSCOPIC) 2.5 % OP SOLN
OPHTHALMIC | Status: AC
  Filled 2017-03-16: qty 15

## 2017-03-16 MED ORDER — MIDAZOLAM HCL 2 MG/2ML IJ SOLN
INTRAMUSCULAR | Status: AC
  Filled 2017-03-16: qty 2

## 2017-03-16 MED ORDER — ONDANSETRON HCL 4 MG/2ML IJ SOLN
INTRAMUSCULAR | Status: AC
  Filled 2017-03-16: qty 2

## 2017-03-16 MED ORDER — SODIUM CHLORIDE 0.9 % IV SOLN
INTRAVENOUS | Status: DC
  Administered 2017-03-16: 19:00:00 via INTRAVENOUS

## 2017-03-16 MED ORDER — FENTANYL CITRATE (PF) 100 MCG/2ML IJ SOLN
INTRAMUSCULAR | Status: DC | PRN
  Administered 2017-03-16 (×4): 50 ug via INTRAVENOUS

## 2017-03-16 MED ORDER — ONDANSETRON HCL 4 MG/2ML IJ SOLN
4.0000 mg | Freq: Four times a day (QID) | INTRAMUSCULAR | Status: DC
  Administered 2017-03-16: 4 mg via INTRAVENOUS
  Filled 2017-03-16: qty 2

## 2017-03-16 MED ORDER — PROPOFOL 10 MG/ML IV BOLUS
INTRAVENOUS | Status: AC
  Filled 2017-03-16: qty 20

## 2017-03-16 MED ORDER — OXYCODONE-ACETAMINOPHEN 5-325 MG PO TABS
1.0000 | ORAL_TABLET | ORAL | Status: DC | PRN
  Administered 2017-03-16 – 2017-03-17 (×2): 2 via ORAL
  Filled 2017-03-16: qty 2

## 2017-03-16 MED ORDER — LIDOCAINE-EPINEPHRINE 2 %-1:100000 IJ SOLN
INTRAMUSCULAR | Status: DC | PRN
  Administered 2017-03-16: 30 mL via INTRADERMAL

## 2017-03-16 MED ORDER — PHENYLEPHRINE HCL 10 MG/ML IJ SOLN
INTRAVENOUS | Status: DC | PRN
  Administered 2017-03-16: 50 ug/min via INTRAVENOUS

## 2017-03-16 MED ORDER — ESMOLOL HCL 100 MG/10ML IV SOLN
INTRAVENOUS | Status: DC | PRN
  Administered 2017-03-16: 20 mg via INTRAVENOUS

## 2017-03-16 MED ORDER — SIMVASTATIN 40 MG PO TABS
40.0000 mg | ORAL_TABLET | Freq: Every evening | ORAL | Status: DC
  Administered 2017-03-16: 40 mg via ORAL
  Filled 2017-03-16: qty 1

## 2017-03-16 MED ORDER — HYDROCHLOROTHIAZIDE 25 MG PO TABS
25.0000 mg | ORAL_TABLET | Freq: Every day | ORAL | Status: DC
Start: 2017-03-16 — End: 2017-03-17
  Administered 2017-03-16 – 2017-03-17 (×2): 25 mg via ORAL
  Filled 2017-03-16 (×2): qty 1

## 2017-03-16 MED ORDER — NA CHONDROIT SULF-NA HYALURON 40-30 MG/ML IO SOLN
INTRAOCULAR | Status: AC
  Filled 2017-03-16: qty 0.5

## 2017-03-16 MED ORDER — PHENYLEPHRINE 40 MCG/ML (10ML) SYRINGE FOR IV PUSH (FOR BLOOD PRESSURE SUPPORT)
PREFILLED_SYRINGE | INTRAVENOUS | Status: DC | PRN
  Administered 2017-03-16: 40 ug via INTRAVENOUS
  Administered 2017-03-16 (×2): 80 ug via INTRAVENOUS

## 2017-03-16 MED ORDER — LACTATED RINGERS IV SOLN
INTRAVENOUS | Status: DC | PRN
  Administered 2017-03-16: 14:00:00 via INTRAVENOUS

## 2017-03-16 MED ORDER — GENTAMICIN SULFATE 40 MG/ML IJ SOLN
Freq: Once | INTRAVENOUS | Status: AC
  Administered 2017-03-16: 100 mL via INTRAVENOUS
  Filled 2017-03-16: qty 9

## 2017-03-16 MED ORDER — METRONIDAZOLE IN NACL 5-0.79 MG/ML-% IV SOLN
500.0000 mg | Freq: Three times a day (TID) | INTRAVENOUS | Status: DC
  Administered 2017-03-16 – 2017-03-17 (×2): 500 mg via INTRAVENOUS
  Filled 2017-03-16 (×3): qty 100

## 2017-03-16 MED ORDER — SODIUM HYALURONATE 10 MG/ML IO SOLN
INTRAOCULAR | Status: AC
  Filled 2017-03-16: qty 0.85

## 2017-03-16 MED ORDER — LIDOCAINE-EPINEPHRINE 2 %-1:100000 IJ SOLN
INTRAMUSCULAR | Status: AC
  Filled 2017-03-16: qty 1

## 2017-03-16 MED ORDER — EPINEPHRINE PF 1 MG/ML IJ SOLN
INTRAMUSCULAR | Status: AC
  Filled 2017-03-16: qty 1

## 2017-03-16 MED ORDER — CIPROFLOXACIN IN D5W 400 MG/200ML IV SOLN
400.0000 mg | Freq: Two times a day (BID) | INTRAVENOUS | Status: DC
  Administered 2017-03-16 – 2017-03-17 (×2): 400 mg via INTRAVENOUS
  Filled 2017-03-16 (×2): qty 200

## 2017-03-16 MED ORDER — POLYMYXIN B SULFATE 500000 UNITS IJ SOLR
INTRAMUSCULAR | Status: AC
  Filled 2017-03-16: qty 500000

## 2017-03-16 MED ORDER — LIDOCAINE 2% (20 MG/ML) 5 ML SYRINGE
INTRAMUSCULAR | Status: DC | PRN
  Administered 2017-03-16: 60 mg via INTRAVENOUS

## 2017-03-16 MED ORDER — ONDANSETRON HCL 4 MG/2ML IJ SOLN
INTRAMUSCULAR | Status: DC | PRN
  Administered 2017-03-16: 4 mg via INTRAVENOUS

## 2017-03-16 MED ORDER — GABAPENTIN 300 MG PO CAPS
300.0000 mg | ORAL_CAPSULE | Freq: Three times a day (TID) | ORAL | Status: DC
  Administered 2017-03-16 – 2017-03-17 (×2): 300 mg via ORAL
  Filled 2017-03-16 (×2): qty 1

## 2017-03-16 MED ORDER — ERYTHROMYCIN 5 MG/GM OP OINT
TOPICAL_OINTMENT | OPHTHALMIC | Status: DC | PRN
  Administered 2017-03-16: 1 via OPHTHALMIC

## 2017-03-16 MED ORDER — FENTANYL CITRATE (PF) 100 MCG/2ML IJ SOLN
INTRAMUSCULAR | Status: AC
Start: 2017-03-16 — End: 2017-03-17
  Filled 2017-03-16: qty 2

## 2017-03-16 MED ORDER — SODIUM CHLORIDE 0.9 % IJ SOLN
INTRAMUSCULAR | Status: AC
  Filled 2017-03-16: qty 10

## 2017-03-16 MED ORDER — METFORMIN HCL 500 MG PO TABS
500.0000 mg | ORAL_TABLET | Freq: Every day | ORAL | Status: DC
  Administered 2017-03-17: 500 mg via ORAL
  Filled 2017-03-16: qty 1

## 2017-03-16 MED ORDER — BSS PLUS IO SOLN
INTRAOCULAR | Status: AC
  Filled 2017-03-16: qty 500

## 2017-03-16 SURGICAL SUPPLY — 51 items
APL SRG 3 HI ABS STRL LF PLS (MISCELLANEOUS) ×1
APPLICATOR COTTON TIP 6IN STRL (MISCELLANEOUS) ×1 IMPLANT
APPLICATOR DR MATTHEWS STRL (MISCELLANEOUS) ×3 IMPLANT
BLADE 10 SAFETY STRL DISP (BLADE) ×1 IMPLANT
BNDG ADH 5X2 AIR PERM ELC (GAUZE/BANDAGES/DRESSINGS)
BNDG COHESIVE 2X5 WHT NS (GAUZE/BANDAGES/DRESSINGS) ×1 IMPLANT
BNDG GAUZE ELAST 4 BULKY (GAUZE/BANDAGES/DRESSINGS) ×1 IMPLANT
CLOSURE WOUND 1/2 X4 (GAUZE/BANDAGES/DRESSINGS) ×1
CONFORMER OPHTHALMIC SM W-HOLE (MISCELLANEOUS) ×1 IMPLANT
CORDS BIPOLAR (ELECTRODE) ×2 IMPLANT
COVER SURGICAL LIGHT HANDLE (MISCELLANEOUS) ×3 IMPLANT
DRAPE OPHTHALMIC 77X100 STRL (CUSTOM PROCEDURE TRAY) ×3 IMPLANT
DRAPE ORTHO SPLIT 77X108 STRL (DRAPES) ×3
DRAPE SURG ORHT 6 SPLT 77X108 (DRAPES) IMPLANT
FORCEPS BIPOLAR SPETZLER 8 1.0 (NEUROSURGERY SUPPLIES) ×2 IMPLANT
GAUZE SPONGE 4X4 16PLY XRAY LF (GAUZE/BANDAGES/DRESSINGS) ×2 IMPLANT
GLOVE SS BIOGEL STRL SZ 8.5 (GLOVE) ×1 IMPLANT
GLOVE SUPERSENSE BIOGEL SZ 8.5 (GLOVE)
GOWN EXTRA PROTECTION XL (GOWNS) ×1 IMPLANT
GOWN STRL REUS W/ TWL LRG LVL3 (GOWN DISPOSABLE) ×1 IMPLANT
GOWN STRL REUS W/ TWL XL LVL3 (GOWN DISPOSABLE) IMPLANT
GOWN STRL REUS W/TWL LRG LVL3 (GOWN DISPOSABLE) ×6
GOWN STRL REUS W/TWL XL LVL3 (GOWN DISPOSABLE) ×3
HEMOSTAT SURGICEL 2X14 (HEMOSTASIS) IMPLANT
IMPL MEDPOR SPHERE EYE 20 (Ophthalmic Related) ×1 IMPLANT
IMPL MEDPOR SPHERE EYE 20MM (Ophthalmic Related) IMPLANT
IMPLANT MEDPOR SPHERE EYE 20MM (Ophthalmic Related) ×3 IMPLANT
KIT ROOM TURNOVER OR (KITS) ×3 IMPLANT
MARKER SKIN DUAL TIP RULER LAB (MISCELLANEOUS) ×1 IMPLANT
NS IRRIG 1000ML POUR BTL (IV SOLUTION) ×3 IMPLANT
PACK CATARACT CUSTOM (CUSTOM PROCEDURE TRAY) ×3 IMPLANT
PAD ARMBOARD 7.5X6 YLW CONV (MISCELLANEOUS) ×4 IMPLANT
SPECIMEN JAR MEDIUM (MISCELLANEOUS) ×2 IMPLANT
SPECIMEN JAR SMALL (MISCELLANEOUS) ×1 IMPLANT
STOCKINETTE IMPERVIOUS 9X36 MD (GAUZE/BANDAGES/DRESSINGS) ×2 IMPLANT
STRIP CLOSURE SKIN 1/2X4 (GAUZE/BANDAGES/DRESSINGS) ×1 IMPLANT
SUT CHROMIC 5 0 P 3 (SUTURE) IMPLANT
SUT MERSILENE 5 0 RD 1 DA (SUTURE) IMPLANT
SUT PROLENE 6 0 C 1 24 (SUTURE) IMPLANT
SUT SILK 2 0 (SUTURE) ×3
SUT SILK 2-0 18XBRD TIE 12 (SUTURE) IMPLANT
SUT SILK 4 0 P 3 (SUTURE) ×2 IMPLANT
SUT SILK 4 0 TIE 10X30 (SUTURE) ×2 IMPLANT
SUT VIC AB 4-0 P-3 18X BRD (SUTURE) IMPLANT
SUT VIC AB 4-0 P3 18 (SUTURE)
SUT VIC AB 5-0 DAS24 8 (SUTURE) ×2 IMPLANT
SUT VICRYL 7 0 TG140 8 (SUTURE) ×2 IMPLANT
SYR 30ML LL (SYRINGE) ×2 IMPLANT
SYR 50ML SLIP (SYRINGE) ×2 IMPLANT
TOWEL OR 17X24 6PK STRL BLUE (TOWEL DISPOSABLE) ×7 IMPLANT
WATER STERILE IRR 1000ML POUR (IV SOLUTION) ×3 IMPLANT

## 2017-03-16 NOTE — Op Note (Signed)
Procedure(s): ENUCLEATION RIGHT EYE Procedure Note  Shari [name] female 61 y.o. 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 An ophthalmology exam revealed findings of blind painful right eye. The patient now presents for eucleation right eye with implant and attachment of muscles to the implant after discussing therapeutic alternatives.        Surgeon: [name]   Assistants: NONE  Anesthesia: General endotracheal anesthesia  ASA Class: 2    Procedure Detail  ENUCLEATION RIGHT EYE Patient is aware that she has a blind and painful right eye. The risks and benefits of the procedure were reviewed with the patient including bleeding, infection, implant extrusion, scarring, and loss of life. The patient understands and agrees to proceed with an enucleation of the right eye with implant and attachment of the muscles to the implant.   The patient was transported to the operating room in supine position where they were prepped and draped in the standard fashion for oculoplastic surgery. Attention was turned to the right eye which was re-confirmed to the be the correct eye. Local anesthesia was injected subconjunctivally. Then a 360 degree peritomy was completed with [place]otts scissors. Then blunt dissection was completed in all for quadrants with Andria Meuse scissor to aide in the isolation of the horizonatl and vertical recti. Then each muscle was identified and hooked with a von Graefe hook followed by a Green hook in the opposite direction to ensure the complete hooking of the muscle. Then each muscle was imbricated with a 5-0 vicryl suture and then the sutures were taped to the dressings. Then the inferior oblique was  isolated and then cauterized and then removed.  4-0 silk sutures were used to make traction sutures in the area of the medial and lateral recti. The globe was not perforated during this process. Then forward traction was placed on the globe. Then a long curved hemostat was used to isolate and clamp the optic nerve. Then a long curved [employer] was used to isolate and then the optic nerve was bisected with the scissors.. Any additional tissue was removed from the back of the globe. The optic nerve that was clamped was visualized and cauterized. Hemostasis was maintained with monopolar and bipolar cautery.  Attention was turned to the implant. The smooth surface was marked and the implant was flushed with a misture of normal saline and gentamicin to ensure removal of the air. The implant was introduced with an introducer. It was ensured that minimal tension was required to close the tenons. Then the muscles were attached to the implant. Then the tenons was closed in an interrupted fashion with 5-0 vicryl and then the conjunctiva was closed in a running locking fashion with 7-0 vicryl.  Then ointment was placed followed by a conformer. Then a frost suture was placed.   A retrobulbar block was performed.  Then a pressure patch was placed.   The patient tolerated the procedure well and was transported to PACU in stable condition.   Findings: Blind disorganized right eye.   Estimated Blood Loss:  less than 50 mL         Drains: none         Total IV Fluids:   Blood Given: none          Specimens: right eye          Implants: medpor implant size 20 mm        Complications:  * No complications entered in OR log *         Disposition: PACU - hemodynamically stable.         Condition: stable

## 2017-03-16 NOTE — Anesthesia Preprocedure Evaluation (Signed)
Anesthesia Evaluation  Patient identified by MRN, date of birth, ID band Patient awake    Reviewed: Allergy & Precautions, NPO status , Patient's Chart, lab work & pertinent test results  Airway Mallampati: II  TM Distance: >3 FB Neck ROM: Full    Dental no notable dental hx.    Pulmonary neg pulmonary ROS, former smoker,    Pulmonary exam normal breath sounds clear to auscultation       Cardiovascular hypertension, Normal cardiovascular exam Rhythm:Regular Rate:Normal     Neuro/Psych negative neurological ROS  negative psych ROS   GI/Hepatic negative GI ROS, Neg liver ROS,   Endo/Other  diabetes  Renal/GU negative Renal ROS  negative genitourinary   Musculoskeletal negative musculoskeletal ROS (+)   Abdominal   Peds negative pediatric ROS (+)  Hematology negative hematology ROS (+)   Anesthesia Other Findings   Reproductive/Obstetrics negative OB ROS                             Anesthesia Physical Anesthesia Plan  ASA: II  Anesthesia Plan: General   Post-op Pain Management:    Induction: Intravenous  Airway Management Planned: LMA and Oral ETT  Additional Equipment:   Intra-op Plan:   Post-operative Plan: Extubation in OR  Informed Consent: I have reviewed the patients History and Physical, chart, labs and discussed the procedure including the risks, benefits and alternatives for the proposed anesthesia with the patient or authorized representative who has indicated his/her understanding and acceptance.   Dental advisory given  Plan Discussed with: CRNA and Surgeon  Anesthesia Plan Comments:         Anesthesia Quick Evaluation

## 2017-03-16 NOTE — Transfer of Care (Signed)
Immediate Anesthesia Transfer of Care Note  Patient: Shari [name]  Procedure(s) Performed: Procedure(s): ENUCLEATION RIGHT EYE (Right)  Patient Location: PACU  Anesthesia Type:General  Level of Consciousness: awake, alert  and oriented  Airway & Oxygen Therapy: Patient Spontanous Breathing and Patient connected to nasal cannula oxygen  Post-op Assessment: Report given to RN and Post -op Vital signs reviewed and stable  Post vital signs: Reviewed and stable  Last Vitals:  Vitals:   03/16/17 1138  BP: (!) 148/81  Pulse: 94  Resp: 18  Temp: 37.4 C    Last Pain:  Vitals:   03/16/17 1138  TempSrc: Oral         Complications: No apparent anesthesia complications

## 2017-03-16 NOTE — Brief Op Note (Signed)
03/16/2017  1:26 PM  PATIENT:  [name]  61 y.o. female  PRE-OPERATIVE DIAGNOSIS:  BLIND PAINFUL RIGHT EYE  POST-OPERATIVE DIAGNOSIS:  Same   PROCEDURE:  Procedure(s): ENUCLEATION RIGHT EYE (Right) With Placement of Implant and attachment to muscles to the implant  SURGEON:  Surgeon(s) and Role:    * [name], MD - Primary  PHYSICIAN ASSISTANT:   ASSISTANTS: none   ANESTHESIA:   general  EBL:  No intake/output data recorded.  BLOOD ADMINISTERED:none  DRAINS: none   LOCAL MEDICATIONS USED:  BUPIVICAINE , LIDOCAINE  and OTHER Epinephrine  SPECIMEN:  Right eyeball in toto  DISPOSITION OF SPECIMEN:  PATHOLOGY  COUNTS:  YES  TOURNIQUET:  * No tourniquets in log *  DICTATION: .Note written in EPIC  PLAN OF CARE: Admit for overnight observation  PATIENT DISPOSITION:  PACU - hemodynamically stable.   Delay start of Pharmacological VTE agent (>24hrs) due to surgical blood loss or risk of bleeding: NOT APPLICABLE

## 2017-03-16 NOTE — H&P (Signed)
Subjective:    Shari [name] is a 61 y.o. female who presents for evaluation of blind painful right  eye. The pain is described as 7/10. Onset was several years ago. Symptoms have been unchanged since.   Review of Systems Pertinent items noted in HPI and remainder of comprehensive ROS otherwise negative.    Objective:   There were no vitals taken for this visit.  General:  alert, cooperative and appears stated age Skin:  normal and no rash or abnormalities Eyes: positive findings: eyelids/periorbital: right blind painful eye Mouth: MMM no lesions Lymph Nodes:  Cervical, supraclavicular, and axillary nodes normal. Lungs:  clear to auscultation bilaterally Heart:  regular rate and rhythm, S1, S2 normal, no murmur, click, rub or gallop Abdomen: soft, non-tender; bowel sounds normal; no masses,  no organomegaly CVA:  absent Genitourinary: defer exam Extremities:  extremities normal, atraumatic, no cyanosis or edema Neurologic:  Alert and oriented x3. Gait normal. Reflexes and motor strength normal and symmetric. Cranial nerves 2-12 and sensation grossly intact. Psychiatric:  normal mood, behavior, speech, dress, and thought processes    Assessment: Blind Painful Right Eye   Plan: Enucleation Right Eye  1. Discussed the risk of surgery,  and the risks of general anesthetic including MI, CVA, sudden death or even reaction to anesthetic medications. The patient understands the risks, any and all questions were answered to the patient's satisfaction. 2. Follow up: 1 day.  Date of Surgery Update (To be completed by Attending Surgeon day of surgery.)

## 2017-03-16 NOTE — Anesthesia Procedure Notes (Signed)
Procedure Name: Intubation Date/Time: 03/16/2017 2:06 PM Performed by: Merrilyn Puma B Pre-anesthesia Checklist: Patient identified, Emergency Drugs available, Suction available, Patient being monitored and Timeout performed Patient Re-evaluated:Patient Re-evaluated prior to inductionOxygen Delivery Method: Circle system utilized Preoxygenation: Pre-oxygenation with 100% oxygen Intubation Type: IV induction Ventilation: Mask ventilation without difficulty Laryngoscope Size: Mac and 3 Grade View: Grade I Tube type: Oral Tube size: 7.0 mm Number of attempts: 1 Airway Equipment and Method: Stylet Placement Confirmation: ETT inserted through vocal cords under direct vision,  positive ETCO2,  CO2 detector and breath sounds checked- equal and bilateral Secured at: 21 cm Tube secured with: Tape Dental Injury: Teeth and Oropharynx as per pre-operative assessment

## 2017-03-17 DIAGNOSIS — H5461 Unqualified visual loss, right eye, normal vision left eye: Secondary | ICD-10-CM | POA: Diagnosis not present

## 2017-03-17 LAB — GLUCOSE, CAPILLARY: GLUCOSE-CAPILLARY: 112 mg/dL — AB (ref 65–99)

## 2017-03-17 NOTE — Anesthesia Postprocedure Evaluation (Signed)
Anesthesia Post Note  Patient: Shari [name]  Procedure(s) Performed: Procedure(s) (LRB): ENUCLEATION RIGHT EYE (Right)  Patient location during evaluation: PACU Anesthesia Type: General Level of consciousness: awake and alert Pain management: pain level controlled Vital Signs Assessment: post-procedure vital signs reviewed and stable Respiratory status: spontaneous breathing, nonlabored ventilation, respiratory function stable and patient connected to nasal cannula oxygen Cardiovascular status: blood pressure returned to baseline and stable Postop Assessment: no signs of nausea or vomiting Anesthetic complications: no       Last Vitals:  Vitals:   03/16/17 2052 03/17/17 0558  BP: 117/66 (!) 103/54  Pulse: 78 71  Resp: 17 18  Temp: 36.7 C 36.9 C    Last Pain:  Vitals:   03/17/17 0558  TempSrc: Oral  PainSc:                  [name] S

## 2017-03-22 ENCOUNTER — Encounter (HOSPITAL_COMMUNITY): Payer: Self-pay | Admitting: Oculoplastics Ophthalmology

## 2017-04-06 NOTE — Discharge Summary (Signed)
Physician Discharge Summary   [name] [id number] 60 y.o. 1956/08/01  Admit date: 03/16/2017  Discharge date and time: 03/17/2017  9:00 AM   Admitting Physician: Shari FlockUsiwoma Ene Demika Langenderfer, MD   Discharge Physician: Shari DasUsiwoma Omah Dewalt MD  Admission Diagnoses: BLIND PAINFUL RIGHT EYE  Discharge Diagnoses: Same  Admission Condition: good  Discharged Condition: good  Indication for Admission: S/p Enucleation  Hospital Course: Pt was admitted s/p enucleation for pain and nausea control. Patient had an uncomplicated course.   Consults: None  Significant Diagnostic Studies: none  Treatments: IV hydration, antibiotics: Ancef and analgesia: acetaminophen w/ codeine and Morphine  Discharge Exam: BP (!) 103/54 (BP Location: Right Arm)   Pulse 71   Temp 98.4 F (36.9 C) (Oral)   Resp 18   Ht 5\' 4"  (1.626 m)   Wt 72.7 kg (160 lb 3.2 oz)   SpO2 98%   BMI 27.50 kg/m   General Appearance:    Alert, cooperative, no distress, appears stated age  Head:    Normocephalic, without obvious abnormality, atraumatic  Eyes:    PERRL, conjunctiva/corneas clear, EOM's intact, fundi    benign, both eyes  Ears:    Normal TM's and external ear canals, both ears  Nose:   Nares normal, septum midline, mucosa normal, no drainage    or sinus tenderness  Throat:   Lips, mucosa, and tongue normal; teeth and gums normal  Neck:   Supple, symmetrical, trachea midline, no adenopathy;    thyroid:  no enlargement/tenderness/nodules; no carotid   bruit or JVD  Back:     Symmetric, no curvature, ROM normal, no CVA tenderness  Lungs:     Clear to auscultation bilaterally, respirations unlabored  Chest Wall:    No tenderness or deformity   Heart:    Regular rate and rhythm, S1 and S2 normal, no murmur, rub   or gallop  Breast Exam:    No tenderness, masses, or nipple abnormality  Abdomen:     Soft, non-tender, bowel sounds active all four quadrants,    no masses, no organomegaly  Genitalia:    Normal  female without lesion, discharge or tenderness  Rectal:    Normal tone, normal prostate, no masses or tenderness;   guaiac negative stool  Extremities:   Extremities normal, atraumatic, no cyanosis or edema  Pulses:   2+ and symmetric all extremities  Skin:   Skin color, texture, turgor normal, no rashes or lesions  Lymph nodes:   Cervical, supraclavicular, and axillary nodes normal  Neurologic:   CNII-XII intact, normal strength, sensation and reflexes    throughout    Disposition: 01-Home or Self Care  Patient Instructions:  Allergies as of 03/17/2017      Reactions   Aspirin Hives, Itching   Chocolate Hives, Itching   Penicillins Hives, Itching   Has patient had a PCN reaction causing IMMEDIATE RASH, FACIAL/TONGUE/THROAT SWELLING, SOB, OR LIGHTHEADEDNESS WITH HYPOTENSION:  #  #  #  YES  #  #  #  Has patient had a PCN reaction causing severe rash involving mucus membranes or skin necrosis: No Has patient had a PCN reaction that required hospitalization No Has patient had a PCN reaction occurring within the last 10 years: No If all of the above answers are "NO", then may proceed with Cephalosporin use.   Sulfa Antibiotics Hives, Itching      Medication List    TAKE these medications   docusate sodium 100 MG capsule Commonly known as:  COLACE Take 100 mg by mouth daily.   gabapentin 300 MG capsule Commonly known as:  NEURONTIN Take 300 mg by mouth 3 (three) times daily.   hydrochlorothiazide 25 MG tablet Commonly known as:  HYDRODIURIL Take 25 mg by mouth daily.   lisinopril 10 MG tablet Commonly known as:  PRINIVIL,ZESTRIL Take 10 mg by mouth daily.   meloxicam 15 MG tablet Commonly known as:  MOBIC Take 15 mg by mouth daily.   metFORMIN 500 MG tablet Commonly known as:  GLUCOPHAGE TAKE 1 TABLET BY MOUTH DAILY IN MORNING   multivitamin with minerals Tabs tablet Take 1 tablet by mouth daily.   nystatin powder Generic drug:  nystatin Apply 1 application  topically as needed (ITCHING).   oxyCODONE-acetaminophen 5-325 MG tablet Commonly known as:  PERCOCET/ROXICET Take 1-2 tablets by mouth every 4 (four) hours as needed for severe pain.   pantoprazole 20 MG tablet Commonly known as:  PROTONIX Take 20 mg by mouth daily.   simvastatin 40 MG tablet Commonly known as:  ZOCOR Take 40 mg by mouth every evening.      Activity: activity as tolerated Diet: regular diet Wound Care: patch to be kept on until seen by physician  Follow-up with Dr Harvel Quale in 1 day.  Signed: [name] 04/06/2017 10:20 AM

## 2017-05-08 NOTE — Addendum Note (Signed)
Addendum  created 05/08/17 1312 by Eilene Ghaziose, Abygail Galeno, MD   Sign clinical note

## 2017-05-08 NOTE — Anesthesia Postprocedure Evaluation (Signed)
Anesthesia Post Note  Patient: Shari [name]  Procedure(s) Performed: Procedure(s) (LRB): ENUCLEATION RIGHT EYE (Right)     Anesthesia Post Evaluation  Last Vitals:  Vitals:   03/16/17 2052 03/17/17 0558  BP: 117/66 (!) 103/54  Pulse: 78 71  Resp: 17 18  Temp: 36.7 C 36.9 C    Last Pain:  Vitals:   03/17/17 0558  TempSrc: Oral  PainSc:                  Tanaka Gillen S

## 2018-02-26 ENCOUNTER — Encounter: Payer: Self-pay | Admitting: Gastroenterology

## 2018-03-21 DIAGNOSIS — N950 Postmenopausal bleeding: Secondary | ICD-10-CM | POA: Insufficient documentation

## 2018-03-21 DIAGNOSIS — R339 Retention of urine, unspecified: Secondary | ICD-10-CM | POA: Insufficient documentation

## 2018-03-21 DIAGNOSIS — N852 Hypertrophy of uterus: Secondary | ICD-10-CM | POA: Insufficient documentation

## 2019-04-08 DIAGNOSIS — G894 Chronic pain syndrome: Secondary | ICD-10-CM | POA: Diagnosis not present

## 2019-04-08 DIAGNOSIS — M47816 Spondylosis without myelopathy or radiculopathy, lumbar region: Secondary | ICD-10-CM | POA: Diagnosis not present

## 2019-04-08 DIAGNOSIS — M5136 Other intervertebral disc degeneration, lumbar region: Secondary | ICD-10-CM | POA: Diagnosis not present

## 2019-04-10 DIAGNOSIS — I10 Essential (primary) hypertension: Secondary | ICD-10-CM | POA: Diagnosis not present

## 2019-04-10 DIAGNOSIS — K219 Gastro-esophageal reflux disease without esophagitis: Secondary | ICD-10-CM | POA: Diagnosis not present

## 2019-04-10 DIAGNOSIS — M545 Low back pain: Secondary | ICD-10-CM | POA: Diagnosis not present

## 2019-04-10 DIAGNOSIS — E119 Type 2 diabetes mellitus without complications: Secondary | ICD-10-CM | POA: Diagnosis not present

## 2019-04-10 DIAGNOSIS — E782 Mixed hyperlipidemia: Secondary | ICD-10-CM | POA: Diagnosis not present

## 2019-04-16 DIAGNOSIS — R1110 Vomiting, unspecified: Secondary | ICD-10-CM | POA: Diagnosis not present

## 2019-04-16 DIAGNOSIS — I10 Essential (primary) hypertension: Secondary | ICD-10-CM | POA: Diagnosis not present

## 2019-04-16 DIAGNOSIS — K5900 Constipation, unspecified: Secondary | ICD-10-CM | POA: Diagnosis not present

## 2019-04-16 DIAGNOSIS — E279 Disorder of adrenal gland, unspecified: Secondary | ICD-10-CM | POA: Diagnosis not present

## 2019-04-16 DIAGNOSIS — Z79899 Other long term (current) drug therapy: Secondary | ICD-10-CM | POA: Diagnosis not present

## 2019-04-16 DIAGNOSIS — Z87891 Personal history of nicotine dependence: Secondary | ICD-10-CM | POA: Diagnosis not present

## 2019-04-16 DIAGNOSIS — I700 Atherosclerosis of aorta: Secondary | ICD-10-CM | POA: Diagnosis not present

## 2019-04-16 DIAGNOSIS — J439 Emphysema, unspecified: Secondary | ICD-10-CM | POA: Diagnosis not present

## 2019-04-16 DIAGNOSIS — E119 Type 2 diabetes mellitus without complications: Secondary | ICD-10-CM | POA: Diagnosis not present

## 2019-04-16 DIAGNOSIS — A084 Viral intestinal infection, unspecified: Secondary | ICD-10-CM | POA: Diagnosis not present

## 2019-04-16 DIAGNOSIS — E7800 Pure hypercholesterolemia, unspecified: Secondary | ICD-10-CM | POA: Diagnosis not present

## 2019-04-24 DIAGNOSIS — R946 Abnormal results of thyroid function studies: Secondary | ICD-10-CM | POA: Diagnosis not present

## 2019-04-24 DIAGNOSIS — R110 Nausea: Secondary | ICD-10-CM | POA: Diagnosis not present

## 2019-04-24 DIAGNOSIS — E1169 Type 2 diabetes mellitus with other specified complication: Secondary | ICD-10-CM | POA: Diagnosis not present

## 2019-04-24 DIAGNOSIS — I10 Essential (primary) hypertension: Secondary | ICD-10-CM | POA: Diagnosis not present

## 2019-04-26 DIAGNOSIS — E0590 Thyrotoxicosis, unspecified without thyrotoxic crisis or storm: Secondary | ICD-10-CM | POA: Diagnosis not present

## 2019-05-02 DIAGNOSIS — D638 Anemia in other chronic diseases classified elsewhere: Secondary | ICD-10-CM | POA: Diagnosis not present

## 2019-05-06 DIAGNOSIS — G894 Chronic pain syndrome: Secondary | ICD-10-CM | POA: Diagnosis not present

## 2019-05-06 DIAGNOSIS — M5136 Other intervertebral disc degeneration, lumbar region: Secondary | ICD-10-CM | POA: Diagnosis not present

## 2019-05-13 DIAGNOSIS — R0989 Other specified symptoms and signs involving the circulatory and respiratory systems: Secondary | ICD-10-CM | POA: Diagnosis not present

## 2019-05-13 DIAGNOSIS — J439 Emphysema, unspecified: Secondary | ICD-10-CM | POA: Diagnosis not present

## 2019-05-13 DIAGNOSIS — I4891 Unspecified atrial fibrillation: Secondary | ICD-10-CM | POA: Diagnosis not present

## 2019-05-13 DIAGNOSIS — Z87891 Personal history of nicotine dependence: Secondary | ICD-10-CM | POA: Diagnosis not present

## 2019-05-13 DIAGNOSIS — R531 Weakness: Secondary | ICD-10-CM | POA: Diagnosis not present

## 2019-05-13 DIAGNOSIS — R0902 Hypoxemia: Secondary | ICD-10-CM | POA: Diagnosis not present

## 2019-05-13 DIAGNOSIS — E7800 Pure hypercholesterolemia, unspecified: Secondary | ICD-10-CM | POA: Diagnosis not present

## 2019-05-13 DIAGNOSIS — I10 Essential (primary) hypertension: Secondary | ICD-10-CM | POA: Diagnosis not present

## 2019-05-13 DIAGNOSIS — Z03818 Encounter for observation for suspected exposure to other biological agents ruled out: Secondary | ICD-10-CM | POA: Diagnosis not present

## 2019-05-13 DIAGNOSIS — R000 Tachycardia, unspecified: Secondary | ICD-10-CM | POA: Diagnosis not present

## 2019-05-13 DIAGNOSIS — E0590 Thyrotoxicosis, unspecified without thyrotoxic crisis or storm: Secondary | ICD-10-CM | POA: Diagnosis not present

## 2019-05-13 DIAGNOSIS — I499 Cardiac arrhythmia, unspecified: Secondary | ICD-10-CM | POA: Diagnosis not present

## 2019-05-15 DIAGNOSIS — R000 Tachycardia, unspecified: Secondary | ICD-10-CM | POA: Diagnosis not present

## 2019-05-15 DIAGNOSIS — E0590 Thyrotoxicosis, unspecified without thyrotoxic crisis or storm: Secondary | ICD-10-CM | POA: Diagnosis not present

## 2019-05-15 DIAGNOSIS — I10 Essential (primary) hypertension: Secondary | ICD-10-CM | POA: Diagnosis not present

## 2019-05-22 DIAGNOSIS — E0590 Thyrotoxicosis, unspecified without thyrotoxic crisis or storm: Secondary | ICD-10-CM | POA: Diagnosis not present

## 2019-05-22 DIAGNOSIS — R296 Repeated falls: Secondary | ICD-10-CM | POA: Diagnosis not present

## 2019-05-22 DIAGNOSIS — R531 Weakness: Secondary | ICD-10-CM | POA: Diagnosis not present

## 2019-05-29 ENCOUNTER — Ambulatory Visit (INDEPENDENT_AMBULATORY_CARE_PROVIDER_SITE_OTHER): Payer: Medicare Other | Admitting: Cardiology

## 2019-05-29 ENCOUNTER — Other Ambulatory Visit: Payer: Self-pay

## 2019-05-29 ENCOUNTER — Encounter: Payer: Self-pay | Admitting: Cardiology

## 2019-05-29 DIAGNOSIS — I480 Paroxysmal atrial fibrillation: Secondary | ICD-10-CM | POA: Diagnosis not present

## 2019-05-29 DIAGNOSIS — E782 Mixed hyperlipidemia: Secondary | ICD-10-CM | POA: Insufficient documentation

## 2019-05-29 DIAGNOSIS — R002 Palpitations: Secondary | ICD-10-CM | POA: Diagnosis not present

## 2019-05-29 DIAGNOSIS — E0590 Thyrotoxicosis, unspecified without thyrotoxic crisis or storm: Secondary | ICD-10-CM

## 2019-05-29 NOTE — Patient Instructions (Signed)
Medication Instructions:  Your physician recommends that you continue on your current medications as directed. Please refer to the Current Medication list given to you today.  If you need a refill on your cardiac medications before your next appointment, please call your pharmacy.   Lab work: NONE If you have labs (blood work) drawn today and your tests are completely normal, you will receive your results only by: Marland Kitchen MyChart Message (if you have MyChart) OR . A paper copy in the mail If you have any lab test that is abnormal or we need to change your treatment, we will call you to review the results.  Testing/Procedures: Your physician has recommended that you wear a ZIO monitor. ZIO monitors are medical devices that record the heart's electrical activity. Doctors most often use these monitors to diagnose arrhythmias. Arrhythmias are problems with the speed or rhythm of the heartbeat. The monitor is a small, portable device. You can wear one while you do your normal daily activities. This is usually used to diagnose what is causing palpitations/syncope (passing out).You will wear this device for 14 days.  Your physician has requested that you have an echocardiogram. Echocardiography is a painless test that uses sound waves to create images of your heart. It provides your doctor with information about the size and shape of your heart and how well your heart's chambers and valves are working. This procedure takes approximately one hour. There are no restrictions for this procedure.   Follow-Up: At [facility], you and your health needs are our priority.  As part of our continuing mission to provide you with exceptional heart care, we have created designated Provider Care Teams.  These Care Teams include your primary Cardiologist (physician) and Advanced Practice Providers (APPs -  Physician Assistants and Nurse Practitioners) who all work together to provide you with the care you need, when you  need it. You will need a follow up appointment in 1 months.     Any Other Special Instructions Will Be Listed Below   Echocardiogram An echocardiogram is a procedure that uses painless sound waves (ultrasound) to produce an image of the heart. Images from an echocardiogram can provide important information about:  Signs of coronary artery disease (CAD).  Aneurysm detection. An aneurysm is a weak or damaged part of an artery wall that bulges out from the normal force of blood pumping through the body.  Heart size and shape. Changes in the size or shape of the heart can be associated with certain conditions, including heart failure, aneurysm, and CAD.  Heart muscle function.  Heart valve function.  Signs of a past heart attack.  Fluid buildup around the heart.  Thickening of the heart muscle.  A tumor or infectious growth around the heart valves. Tell a health care provider about:  Any allergies you have.  All medicines you are taking, including vitamins, herbs, eye drops, creams, and over-the-counter medicines.  Any blood disorders you have.  Any surgeries you have had.  Any medical conditions you have.  Whether you are pregnant or may be pregnant. What are the risks? Generally, this is a safe procedure. However, problems may occur, including:  Allergic reaction to dye (contrast) that may be used during the procedure. What happens before the procedure? No specific preparation is needed. You may eat and drink normally. What happens during the procedure?   An IV tube may be inserted into one of your veins.  You may receive contrast through this tube. A contrast is  an injection that improves the quality of the pictures from your heart.  A gel will be applied to your chest.  A wand-like tool (transducer) will be moved over your chest. The gel will help to transmit the sound waves from the transducer.  The sound waves will harmlessly bounce off of your heart to  allow the heart images to be captured in real-time motion. The images will be recorded on a computer. The procedure may vary among health care providers and hospitals. What happens after the procedure?  You may return to your normal, everyday life, including diet, activities, and medicines, unless your health care provider tells you not to do that. Summary  An echocardiogram is a procedure that uses painless sound waves (ultrasound) to produce an image of the heart.  Images from an echocardiogram can provide important information about the size and shape of your heart, heart muscle function, heart valve function, and fluid buildup around your heart.  You do not need to do anything to prepare before this procedure. You may eat and drink normally.  After the echocardiogram is completed, you may return to your normal, everyday life, unless your health care provider tells you not to do that. This information is not intended to replace advice given to you by your health care provider. Make sure you discuss any questions you have with your health care provider. Document Released: 11/18/2000 Document Revised: 12/24/2016 Document Reviewed: 12/24/2016 Elsevier Interactive Patient Education  2019 [employer]

## 2019-05-29 NOTE — Progress Notes (Signed)
Cardiology Office Note:    Date:  05/29/2019   ID:  Shari [name], DOB 05/31/1956, MRN [id number]  PCP:  Yisroel RammingVollmer, Kelly, MD  Cardiologist:  Garwin Brothersajan R , MD   Referring MD: Shirlee LatchHeyn, Sharon Np, NP    ASSESSMENT:    1. Palpitations   2. Hyperthyroidism   3. Mixed dyslipidemia    PLAN:    In order of problems listed above:  1. Palpitations: The patient has significant issues with hyperthyroidism and is seeing an endocrinologist in the next couple of days.  [name] on beta-blocker therapy which is appropriate at this time.  Echocardiogram will be done to assess murmur heard on auscultation.  I also will give her a 2-week monitor to understand her symptoms better.  At this time her tachyarrhythmias can be best attributed to hyperthyroidism.  Her blood pressure is borderline therefore I will not change any of her medications or increase the beta-blocker at this time.  She will be seen in follow-up appointment in a month or earlier if she has any concerns.  She knows to go to the nearest emergency room for any significant issues.  Her blood pressure stable and borderline.  Lipids are followed by her primary care physician. 2. Patient will be seen in follow-up appointment in 1 month or earlier if the patient has any concerns 3. Patient had multiple questions which were answered to her satisfaction.   Medication Adjustments/Labs and Tests Ordered: Current medicines are reviewed at length with the patient today.  Concerns regarding medicines are outlined above.  No orders of the defined types were placed in this encounter.  No orders of the defined types were placed in this encounter.    History of Present Illness:    Shari [name] is a 63 y.o. female who is being seen today for the evaluation of palpitations and tachyarrhythmia at the request of Shirlee LatchHeyn, Sharon Np, NP.  Patient is a pleasant 63 year old female.  She has past medical history of hypertension, dyslipidemia and hypothyroidism.   She also gives history of smoking in the past.  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  She tells me that she called the EMS and she was told that she was in atrial fibrillation by the EMS.  I reviewed [facility] and primary care physician's records extensively.  The MD in the emergency room states that he will found her to be in sinus rhythm and tachycardia but occasionally she would go into atrial fibrillation on the monitor.  There is no recording of this.  At the time of my evaluation, the patient is alert awake oriented and in no distress.  Past Medical History:  Diagnosis Date  . GERD (gastroesophageal reflux disease)   . History of blood transfusion 1973   "related to abscess burst in my stomach"  . Hyperlipemia   . Hyperlipidemia   . Hypertension   . Osteoarthritis of back    Lowerback   . SVD (spontaneous vaginal delivery)    x 1, baby died at 2 wks of age  . Type II diabetes mellitus (HCC)     Past Surgical History:  Procedure Laterality Date  . APPENDECTOMY    . COLONOSCOPY    . DILATATION & CURETTAGE/HYSTEROSCOPY WITH MYOSURE N/A 02/25/2015   Procedure: DILATATION & CURETTAGE/HYSTEROSCOPY WITH MYOSURE;  Surgeon: Zelphia CairoGretchen Adkins, MD;  Location: [facility];  Service: Gynecology;  Laterality: N/A;  . DILATION AND CURETTAGE OF UTERUS    .  ENUCLEATION Right 03/16/2017  . ENUCLEATION Right 03/16/2017   Procedure: ENUCLEATION RIGHT EYE;  Surgeon: Floydene FlockUsiwoma Ene Abugo, MD;  [facility];  Service: Ophthalmology;  Laterality: Right;  . 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 EYE SURGERY Right ~ 1974   "S/P initial eye injury; scissors stuck in my eye""  . LAPAROSCOPIC CHOLECYSTECTOMY    . SHOULDER ARTHROSCOPY WITH ROTATOR CUFF REPAIR Left    wire stitches per patient  . SHOULDER ARTHROSCOPY WITH ROTATOR CUFF REPAIR Right     Current Medications: Current Meds  Medication Sig  . atenolol (TENORMIN) 25 MG tablet Take  1 tablet by mouth daily.  Marland Kitchen. atorvastatin (LIPITOR) 40 MG tablet Take 1 tablet by mouth daily.  Marland Kitchen. HYDROcodone-acetaminophen (NORCO/VICODIN) 5-325 MG tablet Take 1 tablet by mouth as needed.  Marland Kitchen. lisinopril (PRINIVIL,ZESTRIL) 10 MG tablet Take 10 mg by mouth daily.   . meloxicam (MOBIC) 15 MG tablet Take 15 mg by mouth daily.  . methimazole (TAPAZOLE) 10 MG tablet Take 1 tablet by mouth 2 (two) times daily.  . ondansetron (ZOFRAN) 4 MG tablet Take 1 tablet by mouth as needed.  . pantoprazole (PROTONIX) 40 MG tablet Take 40 mg by mouth daily.   Marland Kitchen. PROMETHEGAN 12.5 MG suppository UNW AND I 1 SUP REC Q 8 H PRF NAUSEA OR VOM     Allergies:   Aspirin, Chocolate, Cocoa, Penicillins, and Sulfa antibiotics   Social History   Socioeconomic History  . Marital status: Widowed    Spouse name: Not on file  . Number of children: Not on file  . Years of education: Not on file  . Highest education level: Not on file  Occupational History  . Not on file  Social Needs  . Financial resource strain: Not on file  . Food insecurity    Worry: Not on file    Inability: Not on file  . Transportation needs    Medical: Not on file    Non-medical: Not on file  Tobacco Use  . Smoking status: Former Smoker    Packs/day: 0.50    Years: 40.00    Pack years: 20.00    Quit date: 2016    Years since quitting: 4.4  . Smokeless tobacco: Never Used  Substance and Sexual Activity  . Alcohol use: Yes    Comment: 03/16/2017 "nothing since 2013"  . Drug use: No  . Sexual activity: Not Currently    Birth control/protection: Post-menopausal  Lifestyle  . Physical activity    Days per week: Not on file    Minutes per session: Not on file  . Stress: Not on file  Relationships  . Social Musicianconnections    Talks on phone: Not on file    Gets together: Not on file    Attends religious service: Not on file    Active member of club or organization: Not on file    Attends meetings of clubs or organizations: Not on file     Relationship status: Not on file  Other Topics Concern  . Not on file  Social History Narrative  . Not on file     Family History: The patient's family history is not on file.  ROS:   Please see the history of present illness.    All other systems reviewed and are negative.  EKGs/Labs/Other Studies Reviewed:    The following studies were reviewed today: As mentioned above.  Emergency room records  were reviewed the TSH is very low and abnormal   Recent Labs: No results found for requested labs within last 8760 hours.  Recent Lipid Panel No results found for: CHOL, TRIG, HDL, CHOLHDL, VLDL, LDLCALC, LDLDIRECT  Physical Exam:    VS:  BP 98/62 (BP Location: Left Arm, Patient Position: Sitting, Cuff Size: Normal)   Pulse 86   Ht 5\' 4"  (1.626 m)   Wt 111 lb (50.3 kg)   SpO2 98%   BMI 19.05 kg/m     Wt Readings from Last 3 Encounters:  05/29/19 111 lb (50.3 kg)  03/16/17 160 lb 3.2 oz (72.7 kg)  02/23/15 153 lb (69.4 kg)     GEN: Patient is in no acute distress HEENT: Normal NECK: No JVD; No carotid bruits LYMPHATICS: No lymphadenopathy CARDIAC: S1 S2 regular, 2/6 systolic murmur at the apex. RESPIRATORY:  Clear to auscultation without rales, wheezing or rhonchi  ABDOMEN: Soft, non-tender, non-distended MUSCULOSKELETAL:  No edema; No deformity  SKIN: Warm and dry NEUROLOGIC:  Alert and oriented x 3 PSYCHIATRIC:  Normal affect    Signed, Jenean Lindau, MD  05/29/2019 10:53 AM    [place]

## 2019-05-29 NOTE — Addendum Note (Signed)
Addended by: Beckey Rutter on: 05/29/2019 11:21 AM   Modules accepted: Orders

## 2019-05-30 ENCOUNTER — Other Ambulatory Visit (INDEPENDENT_AMBULATORY_CARE_PROVIDER_SITE_OTHER): Payer: Medicare Other

## 2019-05-30 DIAGNOSIS — R531 Weakness: Secondary | ICD-10-CM | POA: Diagnosis not present

## 2019-05-30 DIAGNOSIS — M159 Polyosteoarthritis, unspecified: Secondary | ICD-10-CM | POA: Diagnosis not present

## 2019-05-30 DIAGNOSIS — R296 Repeated falls: Secondary | ICD-10-CM | POA: Diagnosis not present

## 2019-05-30 DIAGNOSIS — Z9181 History of falling: Secondary | ICD-10-CM | POA: Diagnosis not present

## 2019-05-30 DIAGNOSIS — E1169 Type 2 diabetes mellitus with other specified complication: Secondary | ICD-10-CM | POA: Diagnosis not present

## 2019-05-30 DIAGNOSIS — E0590 Thyrotoxicosis, unspecified without thyrotoxic crisis or storm: Secondary | ICD-10-CM | POA: Diagnosis not present

## 2019-05-30 DIAGNOSIS — R002 Palpitations: Secondary | ICD-10-CM | POA: Diagnosis not present

## 2019-05-30 DIAGNOSIS — E785 Hyperlipidemia, unspecified: Secondary | ICD-10-CM | POA: Diagnosis not present

## 2019-05-30 DIAGNOSIS — Z79899 Other long term (current) drug therapy: Secondary | ICD-10-CM | POA: Diagnosis not present

## 2019-05-30 DIAGNOSIS — Z791 Long term (current) use of non-steroidal anti-inflammatories (NSAID): Secondary | ICD-10-CM | POA: Diagnosis not present

## 2019-05-30 DIAGNOSIS — I10 Essential (primary) hypertension: Secondary | ICD-10-CM | POA: Diagnosis not present

## 2019-05-30 DIAGNOSIS — R269 Unspecified abnormalities of gait and mobility: Secondary | ICD-10-CM | POA: Diagnosis not present

## 2019-05-31 DIAGNOSIS — R296 Repeated falls: Secondary | ICD-10-CM | POA: Diagnosis not present

## 2019-05-31 DIAGNOSIS — R002 Palpitations: Secondary | ICD-10-CM | POA: Diagnosis not present

## 2019-05-31 DIAGNOSIS — E785 Hyperlipidemia, unspecified: Secondary | ICD-10-CM | POA: Diagnosis not present

## 2019-05-31 DIAGNOSIS — Z791 Long term (current) use of non-steroidal anti-inflammatories (NSAID): Secondary | ICD-10-CM | POA: Diagnosis not present

## 2019-05-31 DIAGNOSIS — R269 Unspecified abnormalities of gait and mobility: Secondary | ICD-10-CM | POA: Diagnosis not present

## 2019-05-31 DIAGNOSIS — E1169 Type 2 diabetes mellitus with other specified complication: Secondary | ICD-10-CM | POA: Diagnosis not present

## 2019-05-31 DIAGNOSIS — Z9181 History of falling: Secondary | ICD-10-CM | POA: Diagnosis not present

## 2019-05-31 DIAGNOSIS — I10 Essential (primary) hypertension: Secondary | ICD-10-CM | POA: Diagnosis not present

## 2019-05-31 DIAGNOSIS — E0590 Thyrotoxicosis, unspecified without thyrotoxic crisis or storm: Secondary | ICD-10-CM | POA: Diagnosis not present

## 2019-05-31 DIAGNOSIS — R531 Weakness: Secondary | ICD-10-CM | POA: Diagnosis not present

## 2019-05-31 DIAGNOSIS — Z79899 Other long term (current) drug therapy: Secondary | ICD-10-CM | POA: Diagnosis not present

## 2019-05-31 DIAGNOSIS — M159 Polyosteoarthritis, unspecified: Secondary | ICD-10-CM | POA: Diagnosis not present

## 2019-06-03 DIAGNOSIS — M47816 Spondylosis without myelopathy or radiculopathy, lumbar region: Secondary | ICD-10-CM | POA: Diagnosis not present

## 2019-06-03 DIAGNOSIS — G894 Chronic pain syndrome: Secondary | ICD-10-CM | POA: Diagnosis not present

## 2019-06-03 DIAGNOSIS — M5136 Other intervertebral disc degeneration, lumbar region: Secondary | ICD-10-CM | POA: Diagnosis not present

## 2019-06-04 DIAGNOSIS — E785 Hyperlipidemia, unspecified: Secondary | ICD-10-CM | POA: Diagnosis not present

## 2019-06-04 DIAGNOSIS — R269 Unspecified abnormalities of gait and mobility: Secondary | ICD-10-CM | POA: Diagnosis not present

## 2019-06-04 DIAGNOSIS — E1169 Type 2 diabetes mellitus with other specified complication: Secondary | ICD-10-CM | POA: Diagnosis not present

## 2019-06-04 DIAGNOSIS — E0590 Thyrotoxicosis, unspecified without thyrotoxic crisis or storm: Secondary | ICD-10-CM | POA: Diagnosis not present

## 2019-06-04 DIAGNOSIS — R531 Weakness: Secondary | ICD-10-CM | POA: Diagnosis not present

## 2019-06-04 DIAGNOSIS — R296 Repeated falls: Secondary | ICD-10-CM | POA: Diagnosis not present

## 2019-06-04 DIAGNOSIS — Z9181 History of falling: Secondary | ICD-10-CM | POA: Diagnosis not present

## 2019-06-04 DIAGNOSIS — Z791 Long term (current) use of non-steroidal anti-inflammatories (NSAID): Secondary | ICD-10-CM | POA: Diagnosis not present

## 2019-06-04 DIAGNOSIS — R002 Palpitations: Secondary | ICD-10-CM | POA: Diagnosis not present

## 2019-06-04 DIAGNOSIS — I10 Essential (primary) hypertension: Secondary | ICD-10-CM | POA: Diagnosis not present

## 2019-06-04 DIAGNOSIS — M159 Polyosteoarthritis, unspecified: Secondary | ICD-10-CM | POA: Diagnosis not present

## 2019-06-04 DIAGNOSIS — Z79899 Other long term (current) drug therapy: Secondary | ICD-10-CM | POA: Diagnosis not present

## 2019-06-05 ENCOUNTER — Encounter: Payer: Self-pay | Admitting: Internal Medicine

## 2019-06-05 ENCOUNTER — Other Ambulatory Visit: Payer: Self-pay

## 2019-06-05 ENCOUNTER — Ambulatory Visit: Payer: Medicare Other | Admitting: Internal Medicine

## 2019-06-05 VITALS — BP 104/68 | HR 65 | Temp 98.0°F | Ht 64.0 in | Wt 110.0 lb

## 2019-06-05 DIAGNOSIS — E0590 Thyrotoxicosis, unspecified without thyrotoxic crisis or storm: Secondary | ICD-10-CM | POA: Diagnosis not present

## 2019-06-05 NOTE — Patient Instructions (Signed)
We recommend that you follow these hyperthyroidism instructions at home:  1) Take Methimazole 10 mg  TWICE a day  If you develop severe sore throat with high fevers OR develop unexplained yellowing of your skin, eyes, under your tongue, severe abdominal pain with nausea or vomiting --> then please get evaluated immediately.   2) Get repeat thyroid labs 6 weeks.   It is ESSENTIAL to get follow-up labs to help avoid over or undertreatment of your hyperthyroidism - both of which can be dangerous to your health.

## 2019-06-05 NOTE — Progress Notes (Signed)
Name: Shari [name]  MRN/ DOB: [id number], 05/28/1956    Age/ Sex: 63 y.o., female    PCP: Philemon KingdomProchnau, Caroline, MD   Reason for Endocrinology Evaluation: Hyperthyroidism     Date of Initial Endocrinology Evaluation: 06/05/2019     HPI: Shari [name] is a 63 y.o. female with a past medical history of T2DM. The patient presented for initial endocrinology clinic visit on 06/05/2019 for consultative assistance with her hyperthyroidism.  Pt presented to her PCP in 04/2019 with 50 lbs weight loss since December, 2019 and loss of appetite. Her TSH was undetectable at < 0.006 uIU/mL with elevated FT4 3.08 ng/dL and T3 578220 ng/dL . She was started on Methimazole at the time 5 mg BID but in June this has been increased 10 mg BID   Her weight has been stable since starting on methimazole  She does have palpitations , jittery and tremulous.  Denies diarrhea or heat intolerance.   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    No FH of thyroid disease.  HISTORY:  Past Medical History:  Past Medical History:  Diagnosis Date  . Eye globe prosthesis   . GERD (gastroesophageal reflux disease)   . History of blood transfusion 1973   "related to abscess burst in my stomach"  . Hyperlipemia   . Hyperlipidemia   . Hypertension   . Osteoarthritis of back    Lowerback   . SVD (spontaneous vaginal delivery)    x 1, baby died at 2 wks of age  . Type II diabetes mellitus (HCC)    Past Surgical History:  Past Surgical History:  Procedure Laterality Date  . APPENDECTOMY    . COLONOSCOPY    . DILATATION & CURETTAGE/HYSTEROSCOPY WITH MYOSURE N/A 02/25/2015   Procedure: DILATATION & CURETTAGE/HYSTEROSCOPY WITH MYOSURE;  Surgeon: Zelphia CairoGretchen Adkins, MD;  Location: [facility];  Service: Gynecology;  Laterality: N/A;  . DILATION AND CURETTAGE OF UTERUS    . ENUCLEATION Right 03/16/2017  . ENUCLEATION Right 03/16/2017   Procedure:  ENUCLEATION RIGHT EYE;  Surgeon: Floydene FlockUsiwoma Ene Abugo, MD;  [facility];  Service: Ophthalmology;  Laterality: Right;  . 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 EYE SURGERY Right ~ 1974   "S/P initial eye injury; scissors stuck in my eye""  . LAPAROSCOPIC CHOLECYSTECTOMY    . SHOULDER ARTHROSCOPY WITH ROTATOR CUFF REPAIR Left    wire stitches per patient  . SHOULDER ARTHROSCOPY WITH ROTATOR CUFF REPAIR Right       Social History:  reports that she quit smoking about 4 years ago. She has a 20.00 pack-year smoking history. She has never used smokeless tobacco. She reports current alcohol use. She reports that she does not use drugs.  Family History: family history includes CAD in her mother; Diabetes in her mother; Lung cancer in her father.   HOME MEDICATIONS: Allergies as of 06/05/2019      Reactions   Aspirin Hives, Itching   Chocolate Hives, Itching   Cocoa Itching, Rash   Penicillins Hives, Itching   Has patient had a PCN reaction causing IMMEDIATE RASH, FACIAL/TONGUE/THROAT SWELLING, SOB, OR LIGHTHEADEDNESS WITH HYPOTENSION:  #  #  #  YES  #  #  #  Has patient had a PCN reaction causing severe rash involving mucus membranes or skin necrosis: No Has patient had a PCN  reaction that required hospitalization No Has patient had a PCN reaction occurring within the last 10 years: No If all of the above answers are "NO", then may proceed with Cephalosporin use.   Sulfa Antibiotics Hives, Itching      Medication List       Accurate as of June 05, 2019  3:46 PM. If you have any questions, ask your nurse or doctor.        atenolol 25 MG tablet Commonly known as: TENORMIN Take 1 tablet by mouth daily.   atorvastatin 40 MG tablet Commonly known as: LIPITOR Take 1 tablet by mouth daily.   HYDROcodone-acetaminophen 5-325 MG tablet Commonly known as: NORCO/VICODIN Take 1 tablet by mouth as needed.   lisinopril 10 MG tablet Commonly known as: ZESTRIL Take 10  mg by mouth daily.   meloxicam 15 MG tablet Commonly known as: MOBIC Take 15 mg by mouth daily.   methimazole 10 MG tablet Commonly known as: TAPAZOLE Take 1 tablet by mouth 2 (two) times daily.   ondansetron 4 MG tablet Commonly known as: ZOFRAN Take 1 tablet by mouth as needed.   pantoprazole 40 MG tablet Commonly known as: PROTONIX Take 40 mg by mouth daily.   Promethegan 12.5 MG suppository Generic drug: promethazine UNW AND I 1 SUP REC Q 8 H PRF NAUSEA OR VOM         REVIEW OF SYSTEMS: A comprehensive ROS was conducted with the patient and is negative except as per HPI and below:  Review of Systems  Constitutional: Negative for fever.  HENT: Negative for congestion and sore throat.   Respiratory: Negative for cough and shortness of breath.   Cardiovascular: Positive for palpitations. Negative for chest pain.  Gastrointestinal: Positive for constipation. Negative for nausea.  Genitourinary: Negative for frequency.  Skin: Negative.   Neurological: Positive for tremors. Negative for tingling.  Endo/Heme/Allergies: Negative for polydipsia.  Psychiatric/Behavioral: Negative for depression. The patient is nervous/anxious.        OBJECTIVE:  VS: BP 104/68 (BP Location: Left Arm, Patient Position: Sitting, Cuff Size: Normal)   Pulse 65   Temp 98 F (36.7 C)   Ht 5\' 4"  (1.626 m)   Wt 110 lb (49.9 kg)   SpO2 97%   BMI 18.88 kg/m    Wt Readings from Last 3 Encounters:  06/05/19 110 lb (49.9 kg)  05/29/19 111 lb (50.3 kg)  03/16/17 160 lb 3.2 oz (72.7 kg)     EXAM: General: Pt appears well and is in NAD  Hydration: Well-hydrated with moist mucous membranes and good skin turgor  Eyes: External eye exam normal on the left without stare, lid lag or exophthalmos.  Right eye prosthetic in place.   Ears, Nose, Throat: Hearing: Grossly intact bilaterally Dental: Good dentition  Throat: Clear without mass, erythema or exudate  Neck: General: Supple without  adenopathy. Thyroid: Thyroid size normal.  No goiter or nodules appreciated. No thyroid bruit.  Lungs: Clear with good BS bilat with no rales, rhonchi, or wheezes  Heart: Auscultation: RRR.  Abdomen: Normoactive bowel sounds, soft, nontender, without masses or organomegaly palpable  Extremities:  BL LE: No pretibial edema normal ROM and strength.  Skin: Hair: Texture and amount normal with gender appropriate distribution Skin Inspection: No rashes. Skin Palpation: Skin temperature, texture, and thickness normal to palpation  Neuro: Cranial nerves: II - XII grossly intact  Motor: Normal strength throughout DTRs: 2+ and symmetric in UE without delay in relaxation phase  Mental Status: Judgment,  insight: Intact Orientation: Oriented to time, place, and person Mood and affect: No depression, anxiety, or agitation     DATA REVIEWED: 04/24/19 TSH < 0.006 uIU/mL  FT4 3.08 uIU/mL T3 220 ng/dL Anti-TPO Ab 7 IU/mL    Thyroid ultrasound 04/26/2019  There's a solitary punctate (~0.4cm) anechoic cyst within the  Inferior pole of the right thyroid lobe, does not meet criteria for FNA or continued monitoring.   Old records , labs and images have been reviewed.   ASSESSMENT/PLAN/RECOMMENDATIONS:   1. Hyperthyroidism:  - Clinically she has been improving on methimazole - Pt states compliance with methimazole without side effects - No local neck symptoms - We did discuss D/D of graves' disease vs toxic nodule (s) vs thyroiditis  - We discussed that Graves' Disease is a result of an autoimmune condition involving the thyroid.  - We discussed with pt the benefits of methimazole in the Tx of hyperthyroidism, as well as the possible side effects/complications of anti-thyroid drug Tx (specifically detailing the rare, but serious side effect of agranulocytosis). She was informed of need for regular thyroid function monitoring while on methimazole to ensure appropriate dosage without over-treatment. As  well, we discussed the possible side effects of methimazole including the chance of rash, the small chance of liver irritation/juandice and the <=1 in 300-400 chance of sudden onset agranulocytosis.  We discussed importance of going to ED promptly (and stopping methimazole) if shewere to develop significant fever with severe sore throat of other evidence of acute infection.     -We extensively discussed the various treatment options for hyperthyroidism  including ablation therapy with radioactive iodine versus antithyroid drug treatment versus surgical therapy.  We recommended to the patient that we felt, at this time, that thionamide therapy would be most optimal.  We discussed the various possible benefits versus side effects of the various therapies.    Medications : Methimazole 10 mg BID     Labs in 6 weeks F/U in 3 months      Signed electronically by: Mack Guise, MD  [facility]  [facility] [address] [place], [place] [address] Phone: [phone] FAX: [phone]   CC: Ernestene Kiel, MD [address] [place] [address] Phone: [phone] Fax: [phone]   Return to Endocrinology clinic as below: Future Appointments  Date Time Provider [place]  06/28/2019  9:40 AM Revankar, Reita Cliche, MD [facility] None  07/12/2019  1:15 PM [facility] [facility] [place] 1 [facility] None  07/17/2019 10:30 AM [facility] None  09/06/2019 10:30 AM Mieka Leaton, Melanie Crazier, MD [facility] None

## 2019-06-06 DIAGNOSIS — E785 Hyperlipidemia, unspecified: Secondary | ICD-10-CM | POA: Diagnosis not present

## 2019-06-06 DIAGNOSIS — E1169 Type 2 diabetes mellitus with other specified complication: Secondary | ICD-10-CM | POA: Diagnosis not present

## 2019-06-06 DIAGNOSIS — Z791 Long term (current) use of non-steroidal anti-inflammatories (NSAID): Secondary | ICD-10-CM | POA: Diagnosis not present

## 2019-06-06 DIAGNOSIS — R002 Palpitations: Secondary | ICD-10-CM | POA: Diagnosis not present

## 2019-06-06 DIAGNOSIS — R296 Repeated falls: Secondary | ICD-10-CM | POA: Diagnosis not present

## 2019-06-06 DIAGNOSIS — R531 Weakness: Secondary | ICD-10-CM | POA: Diagnosis not present

## 2019-06-06 DIAGNOSIS — Z9181 History of falling: Secondary | ICD-10-CM | POA: Diagnosis not present

## 2019-06-06 DIAGNOSIS — Z79899 Other long term (current) drug therapy: Secondary | ICD-10-CM | POA: Diagnosis not present

## 2019-06-06 DIAGNOSIS — M159 Polyosteoarthritis, unspecified: Secondary | ICD-10-CM | POA: Diagnosis not present

## 2019-06-06 DIAGNOSIS — I10 Essential (primary) hypertension: Secondary | ICD-10-CM | POA: Diagnosis not present

## 2019-06-06 DIAGNOSIS — E0590 Thyrotoxicosis, unspecified without thyrotoxic crisis or storm: Secondary | ICD-10-CM | POA: Diagnosis not present

## 2019-06-06 DIAGNOSIS — R269 Unspecified abnormalities of gait and mobility: Secondary | ICD-10-CM | POA: Diagnosis not present

## 2019-06-06 LAB — CBC WITH DIFFERENTIAL/PLATELET
Basophils Absolute: 0.1 10*3/uL (ref 0.0–0.1)
Basophils Relative: 0.9 % (ref 0.0–3.0)
Eosinophils Absolute: 0.1 10*3/uL (ref 0.0–0.7)
Eosinophils Relative: 0.8 % (ref 0.0–5.0)
HCT: 32.3 % — ABNORMAL LOW (ref 36.0–46.0)
Hemoglobin: 10.5 g/dL — ABNORMAL LOW (ref 12.0–15.0)
Lymphocytes Relative: 34.2 % (ref 12.0–46.0)
Lymphs Abs: 2.1 10*3/uL (ref 0.7–4.0)
MCHC: 32.7 g/dL (ref 30.0–36.0)
MCV: 85.4 fl (ref 78.0–100.0)
Monocytes Absolute: 0.5 10*3/uL (ref 0.1–1.0)
Monocytes Relative: 7.9 % (ref 3.0–12.0)
Neutro Abs: 3.4 10*3/uL (ref 1.4–7.7)
Neutrophils Relative %: 56.2 % (ref 43.0–77.0)
Platelets: 337 10*3/uL (ref 150.0–400.0)
RBC: 3.77 Mil/uL — ABNORMAL LOW (ref 3.87–5.11)
RDW: 14 % (ref 11.5–15.5)
WBC: 6.1 10*3/uL (ref 4.0–10.5)

## 2019-06-06 LAB — COMPREHENSIVE METABOLIC PANEL
ALT: 16 U/L (ref 0–35)
AST: 18 U/L (ref 0–37)
Albumin: 4.4 g/dL (ref 3.5–5.2)
Alkaline Phosphatase: 83 U/L (ref 39–117)
BUN: 21 mg/dL (ref 6–23)
CO2: 27 mEq/L (ref 19–32)
Calcium: 10.5 mg/dL (ref 8.4–10.5)
Chloride: 100 mEq/L (ref 96–112)
Creatinine, Ser: 0.79 mg/dL (ref 0.40–1.20)
GFR: 88.95 mL/min (ref >60.00)
Glucose, Bld: 85 mg/dL (ref 70–99)
Potassium: 4.8 mEq/L (ref 3.5–5.1)
Sodium: 136 mEq/L (ref 135–145)
Total Bilirubin: 0.4 mg/dL (ref 0.2–1.2)
Total Protein: 7.7 g/dL (ref 6.0–8.3)

## 2019-06-06 LAB — TSH: TSH: <0.01 u[IU]/mL — ABNORMAL LOW (ref 0.35–4.50)

## 2019-06-06 LAB — T4, FREE: Free T4: 1.51 ng/dL (ref 0.60–1.60)

## 2019-06-07 LAB — TRAB (TSH RECEPTOR BINDING ANTIBODY): TRAB: 4.97 IU/L — ABNORMAL HIGH (ref <=2.00)

## 2019-06-11 DIAGNOSIS — R002 Palpitations: Secondary | ICD-10-CM | POA: Diagnosis not present

## 2019-06-11 DIAGNOSIS — E785 Hyperlipidemia, unspecified: Secondary | ICD-10-CM | POA: Diagnosis not present

## 2019-06-11 DIAGNOSIS — Z9181 History of falling: Secondary | ICD-10-CM | POA: Diagnosis not present

## 2019-06-11 DIAGNOSIS — R531 Weakness: Secondary | ICD-10-CM | POA: Diagnosis not present

## 2019-06-11 DIAGNOSIS — Z79899 Other long term (current) drug therapy: Secondary | ICD-10-CM | POA: Diagnosis not present

## 2019-06-11 DIAGNOSIS — R269 Unspecified abnormalities of gait and mobility: Secondary | ICD-10-CM | POA: Diagnosis not present

## 2019-06-11 DIAGNOSIS — I10 Essential (primary) hypertension: Secondary | ICD-10-CM | POA: Diagnosis not present

## 2019-06-11 DIAGNOSIS — E1169 Type 2 diabetes mellitus with other specified complication: Secondary | ICD-10-CM | POA: Diagnosis not present

## 2019-06-11 DIAGNOSIS — E0590 Thyrotoxicosis, unspecified without thyrotoxic crisis or storm: Secondary | ICD-10-CM | POA: Diagnosis not present

## 2019-06-11 DIAGNOSIS — Z791 Long term (current) use of non-steroidal anti-inflammatories (NSAID): Secondary | ICD-10-CM | POA: Diagnosis not present

## 2019-06-11 DIAGNOSIS — M159 Polyosteoarthritis, unspecified: Secondary | ICD-10-CM | POA: Diagnosis not present

## 2019-06-11 DIAGNOSIS — R296 Repeated falls: Secondary | ICD-10-CM | POA: Diagnosis not present

## 2019-06-12 DIAGNOSIS — R269 Unspecified abnormalities of gait and mobility: Secondary | ICD-10-CM | POA: Diagnosis not present

## 2019-06-12 DIAGNOSIS — R531 Weakness: Secondary | ICD-10-CM | POA: Diagnosis not present

## 2019-06-12 DIAGNOSIS — R002 Palpitations: Secondary | ICD-10-CM | POA: Diagnosis not present

## 2019-06-12 DIAGNOSIS — Z79899 Other long term (current) drug therapy: Secondary | ICD-10-CM | POA: Diagnosis not present

## 2019-06-12 DIAGNOSIS — Z791 Long term (current) use of non-steroidal anti-inflammatories (NSAID): Secondary | ICD-10-CM | POA: Diagnosis not present

## 2019-06-12 DIAGNOSIS — E785 Hyperlipidemia, unspecified: Secondary | ICD-10-CM | POA: Diagnosis not present

## 2019-06-12 DIAGNOSIS — E0590 Thyrotoxicosis, unspecified without thyrotoxic crisis or storm: Secondary | ICD-10-CM | POA: Diagnosis not present

## 2019-06-12 DIAGNOSIS — I10 Essential (primary) hypertension: Secondary | ICD-10-CM | POA: Diagnosis not present

## 2019-06-12 DIAGNOSIS — Z9181 History of falling: Secondary | ICD-10-CM | POA: Diagnosis not present

## 2019-06-12 DIAGNOSIS — R296 Repeated falls: Secondary | ICD-10-CM | POA: Diagnosis not present

## 2019-06-12 DIAGNOSIS — E1169 Type 2 diabetes mellitus with other specified complication: Secondary | ICD-10-CM | POA: Diagnosis not present

## 2019-06-12 DIAGNOSIS — M159 Polyosteoarthritis, unspecified: Secondary | ICD-10-CM | POA: Diagnosis not present

## 2019-06-13 DIAGNOSIS — Z9181 History of falling: Secondary | ICD-10-CM | POA: Diagnosis not present

## 2019-06-13 DIAGNOSIS — D649 Anemia, unspecified: Secondary | ICD-10-CM | POA: Diagnosis not present

## 2019-06-13 DIAGNOSIS — R002 Palpitations: Secondary | ICD-10-CM | POA: Diagnosis not present

## 2019-06-13 DIAGNOSIS — R531 Weakness: Secondary | ICD-10-CM | POA: Diagnosis not present

## 2019-06-13 DIAGNOSIS — E1169 Type 2 diabetes mellitus with other specified complication: Secondary | ICD-10-CM | POA: Diagnosis not present

## 2019-06-13 DIAGNOSIS — R269 Unspecified abnormalities of gait and mobility: Secondary | ICD-10-CM | POA: Diagnosis not present

## 2019-06-13 DIAGNOSIS — Z791 Long term (current) use of non-steroidal anti-inflammatories (NSAID): Secondary | ICD-10-CM | POA: Diagnosis not present

## 2019-06-13 DIAGNOSIS — E785 Hyperlipidemia, unspecified: Secondary | ICD-10-CM | POA: Diagnosis not present

## 2019-06-13 DIAGNOSIS — Z79899 Other long term (current) drug therapy: Secondary | ICD-10-CM | POA: Diagnosis not present

## 2019-06-13 DIAGNOSIS — I10 Essential (primary) hypertension: Secondary | ICD-10-CM | POA: Diagnosis not present

## 2019-06-13 DIAGNOSIS — R296 Repeated falls: Secondary | ICD-10-CM | POA: Diagnosis not present

## 2019-06-13 DIAGNOSIS — M159 Polyosteoarthritis, unspecified: Secondary | ICD-10-CM | POA: Diagnosis not present

## 2019-06-13 DIAGNOSIS — E0590 Thyrotoxicosis, unspecified without thyrotoxic crisis or storm: Secondary | ICD-10-CM | POA: Diagnosis not present

## 2019-06-18 DIAGNOSIS — Z9181 History of falling: Secondary | ICD-10-CM | POA: Diagnosis not present

## 2019-06-18 DIAGNOSIS — M159 Polyosteoarthritis, unspecified: Secondary | ICD-10-CM | POA: Diagnosis not present

## 2019-06-18 DIAGNOSIS — E785 Hyperlipidemia, unspecified: Secondary | ICD-10-CM | POA: Diagnosis not present

## 2019-06-18 DIAGNOSIS — Z791 Long term (current) use of non-steroidal anti-inflammatories (NSAID): Secondary | ICD-10-CM | POA: Diagnosis not present

## 2019-06-18 DIAGNOSIS — E1169 Type 2 diabetes mellitus with other specified complication: Secondary | ICD-10-CM | POA: Diagnosis not present

## 2019-06-18 DIAGNOSIS — R269 Unspecified abnormalities of gait and mobility: Secondary | ICD-10-CM | POA: Diagnosis not present

## 2019-06-18 DIAGNOSIS — R531 Weakness: Secondary | ICD-10-CM | POA: Diagnosis not present

## 2019-06-18 DIAGNOSIS — I10 Essential (primary) hypertension: Secondary | ICD-10-CM | POA: Diagnosis not present

## 2019-06-18 DIAGNOSIS — R002 Palpitations: Secondary | ICD-10-CM | POA: Diagnosis not present

## 2019-06-18 DIAGNOSIS — Z79899 Other long term (current) drug therapy: Secondary | ICD-10-CM | POA: Diagnosis not present

## 2019-06-18 DIAGNOSIS — I480 Paroxysmal atrial fibrillation: Secondary | ICD-10-CM | POA: Diagnosis not present

## 2019-06-18 DIAGNOSIS — E0590 Thyrotoxicosis, unspecified without thyrotoxic crisis or storm: Secondary | ICD-10-CM | POA: Diagnosis not present

## 2019-06-18 DIAGNOSIS — R296 Repeated falls: Secondary | ICD-10-CM | POA: Diagnosis not present

## 2019-06-19 ENCOUNTER — Telehealth: Payer: Self-pay | Admitting: *Deleted

## 2019-06-19 DIAGNOSIS — R531 Weakness: Secondary | ICD-10-CM | POA: Diagnosis not present

## 2019-06-19 DIAGNOSIS — I10 Essential (primary) hypertension: Secondary | ICD-10-CM | POA: Diagnosis not present

## 2019-06-19 DIAGNOSIS — M159 Polyosteoarthritis, unspecified: Secondary | ICD-10-CM | POA: Diagnosis not present

## 2019-06-19 DIAGNOSIS — Z791 Long term (current) use of non-steroidal anti-inflammatories (NSAID): Secondary | ICD-10-CM | POA: Diagnosis not present

## 2019-06-19 DIAGNOSIS — Z79899 Other long term (current) drug therapy: Secondary | ICD-10-CM | POA: Diagnosis not present

## 2019-06-19 DIAGNOSIS — R296 Repeated falls: Secondary | ICD-10-CM | POA: Diagnosis not present

## 2019-06-19 DIAGNOSIS — R269 Unspecified abnormalities of gait and mobility: Secondary | ICD-10-CM | POA: Diagnosis not present

## 2019-06-19 DIAGNOSIS — E785 Hyperlipidemia, unspecified: Secondary | ICD-10-CM | POA: Diagnosis not present

## 2019-06-19 DIAGNOSIS — Z9181 History of falling: Secondary | ICD-10-CM | POA: Diagnosis not present

## 2019-06-19 DIAGNOSIS — E0590 Thyrotoxicosis, unspecified without thyrotoxic crisis or storm: Secondary | ICD-10-CM | POA: Diagnosis not present

## 2019-06-19 DIAGNOSIS — R002 Palpitations: Secondary | ICD-10-CM | POA: Diagnosis not present

## 2019-06-19 DIAGNOSIS — E1169 Type 2 diabetes mellitus with other specified complication: Secondary | ICD-10-CM | POA: Diagnosis not present

## 2019-06-19 NOTE — Telephone Encounter (Signed)
Pt wants to come in the 2nd week of August instead of 7/24 due to some conflicts. The monitor report is back and will be in chart today. Please review monitor results and notify pt if okay to delay f/u. Please advise

## 2019-06-20 DIAGNOSIS — E785 Hyperlipidemia, unspecified: Secondary | ICD-10-CM | POA: Diagnosis not present

## 2019-06-20 DIAGNOSIS — R269 Unspecified abnormalities of gait and mobility: Secondary | ICD-10-CM | POA: Diagnosis not present

## 2019-06-20 DIAGNOSIS — I10 Essential (primary) hypertension: Secondary | ICD-10-CM | POA: Diagnosis not present

## 2019-06-20 DIAGNOSIS — E0590 Thyrotoxicosis, unspecified without thyrotoxic crisis or storm: Secondary | ICD-10-CM | POA: Diagnosis not present

## 2019-06-20 DIAGNOSIS — M159 Polyosteoarthritis, unspecified: Secondary | ICD-10-CM | POA: Diagnosis not present

## 2019-06-20 DIAGNOSIS — Z79899 Other long term (current) drug therapy: Secondary | ICD-10-CM | POA: Diagnosis not present

## 2019-06-20 DIAGNOSIS — E1169 Type 2 diabetes mellitus with other specified complication: Secondary | ICD-10-CM | POA: Diagnosis not present

## 2019-06-20 DIAGNOSIS — R002 Palpitations: Secondary | ICD-10-CM | POA: Diagnosis not present

## 2019-06-20 DIAGNOSIS — R531 Weakness: Secondary | ICD-10-CM | POA: Diagnosis not present

## 2019-06-20 DIAGNOSIS — Z791 Long term (current) use of non-steroidal anti-inflammatories (NSAID): Secondary | ICD-10-CM | POA: Diagnosis not present

## 2019-06-20 DIAGNOSIS — R296 Repeated falls: Secondary | ICD-10-CM | POA: Diagnosis not present

## 2019-06-20 DIAGNOSIS — Z9181 History of falling: Secondary | ICD-10-CM | POA: Diagnosis not present

## 2019-06-21 ENCOUNTER — Telehealth: Payer: Self-pay

## 2019-06-21 DIAGNOSIS — I10 Essential (primary) hypertension: Secondary | ICD-10-CM | POA: Diagnosis not present

## 2019-06-21 DIAGNOSIS — Z9181 History of falling: Secondary | ICD-10-CM | POA: Diagnosis not present

## 2019-06-21 DIAGNOSIS — R002 Palpitations: Secondary | ICD-10-CM | POA: Diagnosis not present

## 2019-06-21 DIAGNOSIS — R296 Repeated falls: Secondary | ICD-10-CM | POA: Diagnosis not present

## 2019-06-21 DIAGNOSIS — E0590 Thyrotoxicosis, unspecified without thyrotoxic crisis or storm: Secondary | ICD-10-CM | POA: Diagnosis not present

## 2019-06-21 DIAGNOSIS — E785 Hyperlipidemia, unspecified: Secondary | ICD-10-CM | POA: Diagnosis not present

## 2019-06-21 DIAGNOSIS — R269 Unspecified abnormalities of gait and mobility: Secondary | ICD-10-CM | POA: Diagnosis not present

## 2019-06-21 DIAGNOSIS — Z79899 Other long term (current) drug therapy: Secondary | ICD-10-CM | POA: Diagnosis not present

## 2019-06-21 DIAGNOSIS — R531 Weakness: Secondary | ICD-10-CM | POA: Diagnosis not present

## 2019-06-21 DIAGNOSIS — E1169 Type 2 diabetes mellitus with other specified complication: Secondary | ICD-10-CM | POA: Diagnosis not present

## 2019-06-21 DIAGNOSIS — M159 Polyosteoarthritis, unspecified: Secondary | ICD-10-CM | POA: Diagnosis not present

## 2019-06-21 DIAGNOSIS — Z791 Long term (current) use of non-steroidal anti-inflammatories (NSAID): Secondary | ICD-10-CM | POA: Diagnosis not present

## 2019-06-21 NOTE — Telephone Encounter (Signed)
Relayed results and changed follow up appt per patient request.

## 2019-06-21 NOTE — Telephone Encounter (Signed)
-----   Message from Jenean Lindau, MD sent at 06/19/2019  4:14 PM EDT ----- Largely unremarkable monitoring.  Will discuss at length the next appointment. Jenean Lindau, MD 06/19/2019 4:14 PM

## 2019-06-24 DIAGNOSIS — R269 Unspecified abnormalities of gait and mobility: Secondary | ICD-10-CM | POA: Diagnosis not present

## 2019-06-24 DIAGNOSIS — E0590 Thyrotoxicosis, unspecified without thyrotoxic crisis or storm: Secondary | ICD-10-CM | POA: Diagnosis not present

## 2019-06-24 DIAGNOSIS — R531 Weakness: Secondary | ICD-10-CM | POA: Diagnosis not present

## 2019-06-24 DIAGNOSIS — R296 Repeated falls: Secondary | ICD-10-CM | POA: Diagnosis not present

## 2019-06-24 DIAGNOSIS — R002 Palpitations: Secondary | ICD-10-CM | POA: Diagnosis not present

## 2019-06-24 DIAGNOSIS — Z9181 History of falling: Secondary | ICD-10-CM | POA: Diagnosis not present

## 2019-06-24 DIAGNOSIS — M159 Polyosteoarthritis, unspecified: Secondary | ICD-10-CM | POA: Diagnosis not present

## 2019-06-24 DIAGNOSIS — Z791 Long term (current) use of non-steroidal anti-inflammatories (NSAID): Secondary | ICD-10-CM | POA: Diagnosis not present

## 2019-06-24 DIAGNOSIS — Z79899 Other long term (current) drug therapy: Secondary | ICD-10-CM | POA: Diagnosis not present

## 2019-06-24 DIAGNOSIS — E785 Hyperlipidemia, unspecified: Secondary | ICD-10-CM | POA: Diagnosis not present

## 2019-06-24 DIAGNOSIS — I10 Essential (primary) hypertension: Secondary | ICD-10-CM | POA: Diagnosis not present

## 2019-06-24 DIAGNOSIS — E1169 Type 2 diabetes mellitus with other specified complication: Secondary | ICD-10-CM | POA: Diagnosis not present

## 2019-06-25 DIAGNOSIS — Z791 Long term (current) use of non-steroidal anti-inflammatories (NSAID): Secondary | ICD-10-CM | POA: Diagnosis not present

## 2019-06-25 DIAGNOSIS — I10 Essential (primary) hypertension: Secondary | ICD-10-CM | POA: Diagnosis not present

## 2019-06-25 DIAGNOSIS — E1169 Type 2 diabetes mellitus with other specified complication: Secondary | ICD-10-CM | POA: Diagnosis not present

## 2019-06-25 DIAGNOSIS — E0590 Thyrotoxicosis, unspecified without thyrotoxic crisis or storm: Secondary | ICD-10-CM | POA: Diagnosis not present

## 2019-06-25 DIAGNOSIS — R002 Palpitations: Secondary | ICD-10-CM | POA: Diagnosis not present

## 2019-06-25 DIAGNOSIS — Z79899 Other long term (current) drug therapy: Secondary | ICD-10-CM | POA: Diagnosis not present

## 2019-06-25 DIAGNOSIS — M159 Polyosteoarthritis, unspecified: Secondary | ICD-10-CM | POA: Diagnosis not present

## 2019-06-25 DIAGNOSIS — Z9181 History of falling: Secondary | ICD-10-CM | POA: Diagnosis not present

## 2019-06-25 DIAGNOSIS — R269 Unspecified abnormalities of gait and mobility: Secondary | ICD-10-CM | POA: Diagnosis not present

## 2019-06-25 DIAGNOSIS — R531 Weakness: Secondary | ICD-10-CM | POA: Diagnosis not present

## 2019-06-25 DIAGNOSIS — E785 Hyperlipidemia, unspecified: Secondary | ICD-10-CM | POA: Diagnosis not present

## 2019-06-25 DIAGNOSIS — R296 Repeated falls: Secondary | ICD-10-CM | POA: Diagnosis not present

## 2019-06-26 DIAGNOSIS — E1169 Type 2 diabetes mellitus with other specified complication: Secondary | ICD-10-CM | POA: Diagnosis not present

## 2019-06-26 DIAGNOSIS — R7989 Other specified abnormal findings of blood chemistry: Secondary | ICD-10-CM | POA: Diagnosis not present

## 2019-06-26 DIAGNOSIS — D649 Anemia, unspecified: Secondary | ICD-10-CM | POA: Diagnosis not present

## 2019-06-26 DIAGNOSIS — E0590 Thyrotoxicosis, unspecified without thyrotoxic crisis or storm: Secondary | ICD-10-CM | POA: Diagnosis not present

## 2019-06-26 DIAGNOSIS — E785 Hyperlipidemia, unspecified: Secondary | ICD-10-CM | POA: Diagnosis not present

## 2019-06-27 DIAGNOSIS — Z1389 Encounter for screening for other disorder: Secondary | ICD-10-CM | POA: Diagnosis not present

## 2019-06-27 DIAGNOSIS — Z9181 History of falling: Secondary | ICD-10-CM | POA: Diagnosis not present

## 2019-06-27 DIAGNOSIS — I10 Essential (primary) hypertension: Secondary | ICD-10-CM | POA: Diagnosis not present

## 2019-06-27 DIAGNOSIS — R531 Weakness: Secondary | ICD-10-CM | POA: Diagnosis not present

## 2019-06-27 DIAGNOSIS — E785 Hyperlipidemia, unspecified: Secondary | ICD-10-CM | POA: Diagnosis not present

## 2019-06-27 DIAGNOSIS — E1169 Type 2 diabetes mellitus with other specified complication: Secondary | ICD-10-CM | POA: Diagnosis not present

## 2019-06-27 DIAGNOSIS — G894 Chronic pain syndrome: Secondary | ICD-10-CM | POA: Diagnosis not present

## 2019-06-27 DIAGNOSIS — R002 Palpitations: Secondary | ICD-10-CM | POA: Diagnosis not present

## 2019-06-27 DIAGNOSIS — Z791 Long term (current) use of non-steroidal anti-inflammatories (NSAID): Secondary | ICD-10-CM | POA: Diagnosis not present

## 2019-06-27 DIAGNOSIS — M5136 Other intervertebral disc degeneration, lumbar region: Secondary | ICD-10-CM | POA: Diagnosis not present

## 2019-06-27 DIAGNOSIS — R296 Repeated falls: Secondary | ICD-10-CM | POA: Diagnosis not present

## 2019-06-27 DIAGNOSIS — R269 Unspecified abnormalities of gait and mobility: Secondary | ICD-10-CM | POA: Diagnosis not present

## 2019-06-27 DIAGNOSIS — M47816 Spondylosis without myelopathy or radiculopathy, lumbar region: Secondary | ICD-10-CM | POA: Diagnosis not present

## 2019-06-27 DIAGNOSIS — Z79899 Other long term (current) drug therapy: Secondary | ICD-10-CM | POA: Diagnosis not present

## 2019-06-27 DIAGNOSIS — M159 Polyosteoarthritis, unspecified: Secondary | ICD-10-CM | POA: Diagnosis not present

## 2019-06-27 DIAGNOSIS — E0590 Thyrotoxicosis, unspecified without thyrotoxic crisis or storm: Secondary | ICD-10-CM | POA: Diagnosis not present

## 2019-06-28 ENCOUNTER — Ambulatory Visit: Payer: Medicare Other | Admitting: Cardiology

## 2019-06-28 DIAGNOSIS — R531 Weakness: Secondary | ICD-10-CM | POA: Diagnosis not present

## 2019-06-28 DIAGNOSIS — E785 Hyperlipidemia, unspecified: Secondary | ICD-10-CM | POA: Diagnosis not present

## 2019-06-28 DIAGNOSIS — Z9181 History of falling: Secondary | ICD-10-CM | POA: Diagnosis not present

## 2019-06-28 DIAGNOSIS — I10 Essential (primary) hypertension: Secondary | ICD-10-CM | POA: Diagnosis not present

## 2019-06-28 DIAGNOSIS — R296 Repeated falls: Secondary | ICD-10-CM | POA: Diagnosis not present

## 2019-06-28 DIAGNOSIS — R269 Unspecified abnormalities of gait and mobility: Secondary | ICD-10-CM | POA: Diagnosis not present

## 2019-06-28 DIAGNOSIS — Z791 Long term (current) use of non-steroidal anti-inflammatories (NSAID): Secondary | ICD-10-CM | POA: Diagnosis not present

## 2019-06-28 DIAGNOSIS — E0590 Thyrotoxicosis, unspecified without thyrotoxic crisis or storm: Secondary | ICD-10-CM | POA: Diagnosis not present

## 2019-06-28 DIAGNOSIS — R002 Palpitations: Secondary | ICD-10-CM | POA: Diagnosis not present

## 2019-06-28 DIAGNOSIS — E1169 Type 2 diabetes mellitus with other specified complication: Secondary | ICD-10-CM | POA: Diagnosis not present

## 2019-06-28 DIAGNOSIS — M159 Polyosteoarthritis, unspecified: Secondary | ICD-10-CM | POA: Diagnosis not present

## 2019-06-28 DIAGNOSIS — Z79899 Other long term (current) drug therapy: Secondary | ICD-10-CM | POA: Diagnosis not present

## 2019-07-01 DIAGNOSIS — R002 Palpitations: Secondary | ICD-10-CM | POA: Diagnosis not present

## 2019-07-01 DIAGNOSIS — R269 Unspecified abnormalities of gait and mobility: Secondary | ICD-10-CM | POA: Diagnosis not present

## 2019-07-01 DIAGNOSIS — I10 Essential (primary) hypertension: Secondary | ICD-10-CM | POA: Diagnosis not present

## 2019-07-01 DIAGNOSIS — R531 Weakness: Secondary | ICD-10-CM | POA: Diagnosis not present

## 2019-07-01 DIAGNOSIS — R296 Repeated falls: Secondary | ICD-10-CM | POA: Diagnosis not present

## 2019-07-01 DIAGNOSIS — Z791 Long term (current) use of non-steroidal anti-inflammatories (NSAID): Secondary | ICD-10-CM | POA: Diagnosis not present

## 2019-07-01 DIAGNOSIS — E785 Hyperlipidemia, unspecified: Secondary | ICD-10-CM | POA: Diagnosis not present

## 2019-07-01 DIAGNOSIS — Z9181 History of falling: Secondary | ICD-10-CM | POA: Diagnosis not present

## 2019-07-01 DIAGNOSIS — E0590 Thyrotoxicosis, unspecified without thyrotoxic crisis or storm: Secondary | ICD-10-CM | POA: Diagnosis not present

## 2019-07-01 DIAGNOSIS — M159 Polyosteoarthritis, unspecified: Secondary | ICD-10-CM | POA: Diagnosis not present

## 2019-07-01 DIAGNOSIS — Z79899 Other long term (current) drug therapy: Secondary | ICD-10-CM | POA: Diagnosis not present

## 2019-07-01 DIAGNOSIS — E1169 Type 2 diabetes mellitus with other specified complication: Secondary | ICD-10-CM | POA: Diagnosis not present

## 2019-07-03 DIAGNOSIS — Z791 Long term (current) use of non-steroidal anti-inflammatories (NSAID): Secondary | ICD-10-CM | POA: Diagnosis not present

## 2019-07-03 DIAGNOSIS — E0590 Thyrotoxicosis, unspecified without thyrotoxic crisis or storm: Secondary | ICD-10-CM | POA: Diagnosis not present

## 2019-07-03 DIAGNOSIS — R531 Weakness: Secondary | ICD-10-CM | POA: Diagnosis not present

## 2019-07-03 DIAGNOSIS — M159 Polyosteoarthritis, unspecified: Secondary | ICD-10-CM | POA: Diagnosis not present

## 2019-07-03 DIAGNOSIS — E1169 Type 2 diabetes mellitus with other specified complication: Secondary | ICD-10-CM | POA: Diagnosis not present

## 2019-07-03 DIAGNOSIS — R002 Palpitations: Secondary | ICD-10-CM | POA: Diagnosis not present

## 2019-07-03 DIAGNOSIS — R300 Dysuria: Secondary | ICD-10-CM | POA: Diagnosis not present

## 2019-07-03 DIAGNOSIS — I10 Essential (primary) hypertension: Secondary | ICD-10-CM | POA: Diagnosis not present

## 2019-07-03 DIAGNOSIS — R269 Unspecified abnormalities of gait and mobility: Secondary | ICD-10-CM | POA: Diagnosis not present

## 2019-07-03 DIAGNOSIS — E785 Hyperlipidemia, unspecified: Secondary | ICD-10-CM | POA: Diagnosis not present

## 2019-07-03 DIAGNOSIS — Z79899 Other long term (current) drug therapy: Secondary | ICD-10-CM | POA: Diagnosis not present

## 2019-07-03 DIAGNOSIS — R296 Repeated falls: Secondary | ICD-10-CM | POA: Diagnosis not present

## 2019-07-03 DIAGNOSIS — Z9181 History of falling: Secondary | ICD-10-CM | POA: Diagnosis not present

## 2019-07-04 DIAGNOSIS — R531 Weakness: Secondary | ICD-10-CM | POA: Diagnosis not present

## 2019-07-04 DIAGNOSIS — Z791 Long term (current) use of non-steroidal anti-inflammatories (NSAID): Secondary | ICD-10-CM | POA: Diagnosis not present

## 2019-07-04 DIAGNOSIS — Z9181 History of falling: Secondary | ICD-10-CM | POA: Diagnosis not present

## 2019-07-04 DIAGNOSIS — Z79899 Other long term (current) drug therapy: Secondary | ICD-10-CM | POA: Diagnosis not present

## 2019-07-04 DIAGNOSIS — R269 Unspecified abnormalities of gait and mobility: Secondary | ICD-10-CM | POA: Diagnosis not present

## 2019-07-04 DIAGNOSIS — E1169 Type 2 diabetes mellitus with other specified complication: Secondary | ICD-10-CM | POA: Diagnosis not present

## 2019-07-04 DIAGNOSIS — E785 Hyperlipidemia, unspecified: Secondary | ICD-10-CM | POA: Diagnosis not present

## 2019-07-04 DIAGNOSIS — E0590 Thyrotoxicosis, unspecified without thyrotoxic crisis or storm: Secondary | ICD-10-CM | POA: Diagnosis not present

## 2019-07-04 DIAGNOSIS — M159 Polyosteoarthritis, unspecified: Secondary | ICD-10-CM | POA: Diagnosis not present

## 2019-07-04 DIAGNOSIS — R002 Palpitations: Secondary | ICD-10-CM | POA: Diagnosis not present

## 2019-07-04 DIAGNOSIS — R296 Repeated falls: Secondary | ICD-10-CM | POA: Diagnosis not present

## 2019-07-04 DIAGNOSIS — I10 Essential (primary) hypertension: Secondary | ICD-10-CM | POA: Diagnosis not present

## 2019-07-08 DIAGNOSIS — Z791 Long term (current) use of non-steroidal anti-inflammatories (NSAID): Secondary | ICD-10-CM | POA: Diagnosis not present

## 2019-07-08 DIAGNOSIS — R269 Unspecified abnormalities of gait and mobility: Secondary | ICD-10-CM | POA: Diagnosis not present

## 2019-07-08 DIAGNOSIS — I10 Essential (primary) hypertension: Secondary | ICD-10-CM | POA: Diagnosis not present

## 2019-07-08 DIAGNOSIS — Z9181 History of falling: Secondary | ICD-10-CM | POA: Diagnosis not present

## 2019-07-08 DIAGNOSIS — R531 Weakness: Secondary | ICD-10-CM | POA: Diagnosis not present

## 2019-07-08 DIAGNOSIS — Z79899 Other long term (current) drug therapy: Secondary | ICD-10-CM | POA: Diagnosis not present

## 2019-07-08 DIAGNOSIS — E785 Hyperlipidemia, unspecified: Secondary | ICD-10-CM | POA: Diagnosis not present

## 2019-07-08 DIAGNOSIS — M159 Polyosteoarthritis, unspecified: Secondary | ICD-10-CM | POA: Diagnosis not present

## 2019-07-08 DIAGNOSIS — E1169 Type 2 diabetes mellitus with other specified complication: Secondary | ICD-10-CM | POA: Diagnosis not present

## 2019-07-08 DIAGNOSIS — R296 Repeated falls: Secondary | ICD-10-CM | POA: Diagnosis not present

## 2019-07-08 DIAGNOSIS — R002 Palpitations: Secondary | ICD-10-CM | POA: Diagnosis not present

## 2019-07-08 DIAGNOSIS — E0590 Thyrotoxicosis, unspecified without thyrotoxic crisis or storm: Secondary | ICD-10-CM | POA: Diagnosis not present

## 2019-07-10 DIAGNOSIS — R002 Palpitations: Secondary | ICD-10-CM | POA: Diagnosis not present

## 2019-07-10 DIAGNOSIS — R296 Repeated falls: Secondary | ICD-10-CM | POA: Diagnosis not present

## 2019-07-10 DIAGNOSIS — Z791 Long term (current) use of non-steroidal anti-inflammatories (NSAID): Secondary | ICD-10-CM | POA: Diagnosis not present

## 2019-07-10 DIAGNOSIS — E785 Hyperlipidemia, unspecified: Secondary | ICD-10-CM | POA: Diagnosis not present

## 2019-07-10 DIAGNOSIS — Z9181 History of falling: Secondary | ICD-10-CM | POA: Diagnosis not present

## 2019-07-10 DIAGNOSIS — Z79899 Other long term (current) drug therapy: Secondary | ICD-10-CM | POA: Diagnosis not present

## 2019-07-10 DIAGNOSIS — R531 Weakness: Secondary | ICD-10-CM | POA: Diagnosis not present

## 2019-07-10 DIAGNOSIS — E1169 Type 2 diabetes mellitus with other specified complication: Secondary | ICD-10-CM | POA: Diagnosis not present

## 2019-07-10 DIAGNOSIS — R269 Unspecified abnormalities of gait and mobility: Secondary | ICD-10-CM | POA: Diagnosis not present

## 2019-07-10 DIAGNOSIS — M159 Polyosteoarthritis, unspecified: Secondary | ICD-10-CM | POA: Diagnosis not present

## 2019-07-10 DIAGNOSIS — I10 Essential (primary) hypertension: Secondary | ICD-10-CM | POA: Diagnosis not present

## 2019-07-10 DIAGNOSIS — E0590 Thyrotoxicosis, unspecified without thyrotoxic crisis or storm: Secondary | ICD-10-CM | POA: Diagnosis not present

## 2019-07-11 DIAGNOSIS — E0590 Thyrotoxicosis, unspecified without thyrotoxic crisis or storm: Secondary | ICD-10-CM | POA: Diagnosis not present

## 2019-07-11 DIAGNOSIS — Z79899 Other long term (current) drug therapy: Secondary | ICD-10-CM | POA: Diagnosis not present

## 2019-07-11 DIAGNOSIS — E1169 Type 2 diabetes mellitus with other specified complication: Secondary | ICD-10-CM | POA: Diagnosis not present

## 2019-07-11 DIAGNOSIS — I10 Essential (primary) hypertension: Secondary | ICD-10-CM | POA: Diagnosis not present

## 2019-07-11 DIAGNOSIS — Z9181 History of falling: Secondary | ICD-10-CM | POA: Diagnosis not present

## 2019-07-11 DIAGNOSIS — R269 Unspecified abnormalities of gait and mobility: Secondary | ICD-10-CM | POA: Diagnosis not present

## 2019-07-11 DIAGNOSIS — R531 Weakness: Secondary | ICD-10-CM | POA: Diagnosis not present

## 2019-07-11 DIAGNOSIS — R296 Repeated falls: Secondary | ICD-10-CM | POA: Diagnosis not present

## 2019-07-11 DIAGNOSIS — Z791 Long term (current) use of non-steroidal anti-inflammatories (NSAID): Secondary | ICD-10-CM | POA: Diagnosis not present

## 2019-07-11 DIAGNOSIS — R002 Palpitations: Secondary | ICD-10-CM | POA: Diagnosis not present

## 2019-07-11 DIAGNOSIS — E785 Hyperlipidemia, unspecified: Secondary | ICD-10-CM | POA: Diagnosis not present

## 2019-07-11 DIAGNOSIS — M159 Polyosteoarthritis, unspecified: Secondary | ICD-10-CM | POA: Diagnosis not present

## 2019-07-12 ENCOUNTER — Other Ambulatory Visit: Payer: Self-pay

## 2019-07-12 ENCOUNTER — Ambulatory Visit (INDEPENDENT_AMBULATORY_CARE_PROVIDER_SITE_OTHER): Payer: Medicare Other

## 2019-07-12 DIAGNOSIS — R002 Palpitations: Secondary | ICD-10-CM | POA: Diagnosis not present

## 2019-07-12 NOTE — Progress Notes (Signed)
Complete echocardiogram has been performed.  Jimmy Viraaj Vorndran RDCS, RVT 

## 2019-07-15 DIAGNOSIS — E0590 Thyrotoxicosis, unspecified without thyrotoxic crisis or storm: Secondary | ICD-10-CM | POA: Diagnosis not present

## 2019-07-15 DIAGNOSIS — R269 Unspecified abnormalities of gait and mobility: Secondary | ICD-10-CM | POA: Diagnosis not present

## 2019-07-15 DIAGNOSIS — Z9181 History of falling: Secondary | ICD-10-CM | POA: Diagnosis not present

## 2019-07-15 DIAGNOSIS — R531 Weakness: Secondary | ICD-10-CM | POA: Diagnosis not present

## 2019-07-15 DIAGNOSIS — E1169 Type 2 diabetes mellitus with other specified complication: Secondary | ICD-10-CM | POA: Diagnosis not present

## 2019-07-15 DIAGNOSIS — E785 Hyperlipidemia, unspecified: Secondary | ICD-10-CM | POA: Diagnosis not present

## 2019-07-15 DIAGNOSIS — I10 Essential (primary) hypertension: Secondary | ICD-10-CM | POA: Diagnosis not present

## 2019-07-15 DIAGNOSIS — R002 Palpitations: Secondary | ICD-10-CM | POA: Diagnosis not present

## 2019-07-15 DIAGNOSIS — Z791 Long term (current) use of non-steroidal anti-inflammatories (NSAID): Secondary | ICD-10-CM | POA: Diagnosis not present

## 2019-07-15 DIAGNOSIS — M159 Polyosteoarthritis, unspecified: Secondary | ICD-10-CM | POA: Diagnosis not present

## 2019-07-15 DIAGNOSIS — R296 Repeated falls: Secondary | ICD-10-CM | POA: Diagnosis not present

## 2019-07-15 DIAGNOSIS — Z79899 Other long term (current) drug therapy: Secondary | ICD-10-CM | POA: Diagnosis not present

## 2019-07-16 ENCOUNTER — Other Ambulatory Visit: Payer: Self-pay | Admitting: Internal Medicine

## 2019-07-16 DIAGNOSIS — R296 Repeated falls: Secondary | ICD-10-CM | POA: Diagnosis not present

## 2019-07-16 DIAGNOSIS — E785 Hyperlipidemia, unspecified: Secondary | ICD-10-CM | POA: Diagnosis not present

## 2019-07-16 DIAGNOSIS — Z791 Long term (current) use of non-steroidal anti-inflammatories (NSAID): Secondary | ICD-10-CM | POA: Diagnosis not present

## 2019-07-16 DIAGNOSIS — Z79899 Other long term (current) drug therapy: Secondary | ICD-10-CM | POA: Diagnosis not present

## 2019-07-16 DIAGNOSIS — M159 Polyosteoarthritis, unspecified: Secondary | ICD-10-CM | POA: Diagnosis not present

## 2019-07-16 DIAGNOSIS — E0590 Thyrotoxicosis, unspecified without thyrotoxic crisis or storm: Secondary | ICD-10-CM | POA: Diagnosis not present

## 2019-07-16 DIAGNOSIS — R002 Palpitations: Secondary | ICD-10-CM | POA: Diagnosis not present

## 2019-07-16 DIAGNOSIS — I10 Essential (primary) hypertension: Secondary | ICD-10-CM | POA: Diagnosis not present

## 2019-07-16 DIAGNOSIS — R269 Unspecified abnormalities of gait and mobility: Secondary | ICD-10-CM | POA: Diagnosis not present

## 2019-07-16 DIAGNOSIS — Z9181 History of falling: Secondary | ICD-10-CM | POA: Diagnosis not present

## 2019-07-16 DIAGNOSIS — E1169 Type 2 diabetes mellitus with other specified complication: Secondary | ICD-10-CM | POA: Diagnosis not present

## 2019-07-16 DIAGNOSIS — R531 Weakness: Secondary | ICD-10-CM | POA: Diagnosis not present

## 2019-07-17 ENCOUNTER — Other Ambulatory Visit (INDEPENDENT_AMBULATORY_CARE_PROVIDER_SITE_OTHER): Payer: Medicare Other

## 2019-07-17 ENCOUNTER — Other Ambulatory Visit: Payer: Self-pay

## 2019-07-17 ENCOUNTER — Encounter: Payer: Self-pay | Admitting: *Deleted

## 2019-07-17 DIAGNOSIS — E0590 Thyrotoxicosis, unspecified without thyrotoxic crisis or storm: Secondary | ICD-10-CM

## 2019-07-17 LAB — TSH: TSH: <0.01 u[IU]/mL — ABNORMAL LOW (ref 0.35–4.50)

## 2019-07-17 LAB — T4, FREE: Free T4: 1.06 ng/dL (ref 0.60–1.60)

## 2019-07-18 ENCOUNTER — Telehealth: Payer: Self-pay | Admitting: Internal Medicine

## 2019-07-18 DIAGNOSIS — R269 Unspecified abnormalities of gait and mobility: Secondary | ICD-10-CM | POA: Diagnosis not present

## 2019-07-18 DIAGNOSIS — R002 Palpitations: Secondary | ICD-10-CM | POA: Diagnosis not present

## 2019-07-18 DIAGNOSIS — R531 Weakness: Secondary | ICD-10-CM | POA: Diagnosis not present

## 2019-07-18 DIAGNOSIS — Z791 Long term (current) use of non-steroidal anti-inflammatories (NSAID): Secondary | ICD-10-CM | POA: Diagnosis not present

## 2019-07-18 DIAGNOSIS — E785 Hyperlipidemia, unspecified: Secondary | ICD-10-CM | POA: Diagnosis not present

## 2019-07-18 DIAGNOSIS — R296 Repeated falls: Secondary | ICD-10-CM | POA: Diagnosis not present

## 2019-07-18 DIAGNOSIS — M159 Polyosteoarthritis, unspecified: Secondary | ICD-10-CM | POA: Diagnosis not present

## 2019-07-18 DIAGNOSIS — I10 Essential (primary) hypertension: Secondary | ICD-10-CM | POA: Diagnosis not present

## 2019-07-18 DIAGNOSIS — E0590 Thyrotoxicosis, unspecified without thyrotoxic crisis or storm: Secondary | ICD-10-CM | POA: Diagnosis not present

## 2019-07-18 DIAGNOSIS — Z79899 Other long term (current) drug therapy: Secondary | ICD-10-CM | POA: Diagnosis not present

## 2019-07-18 DIAGNOSIS — E1169 Type 2 diabetes mellitus with other specified complication: Secondary | ICD-10-CM | POA: Diagnosis not present

## 2019-07-18 DIAGNOSIS — Z9181 History of falling: Secondary | ICD-10-CM | POA: Diagnosis not present

## 2019-07-18 NOTE — Telephone Encounter (Signed)
Pt stated that she is taking 2 tabs daily and pt stated an understanding of changes also scheduled appt for repeat labs in 6 weeks

## 2019-07-18 NOTE — Telephone Encounter (Signed)
Tashia,    Please call her when you get a chance and cofirm she has been taking 2 tablets a day of Methimazole. Her thyroid is still abnormal.   I would suggest the following   Take 2 tablets of Methimazole in the morning and 1 tablet in the evening    Recheck on next visit which should be around 6 weeks   Thanks

## 2019-07-19 DIAGNOSIS — Z79899 Other long term (current) drug therapy: Secondary | ICD-10-CM | POA: Diagnosis not present

## 2019-07-19 DIAGNOSIS — Z791 Long term (current) use of non-steroidal anti-inflammatories (NSAID): Secondary | ICD-10-CM | POA: Diagnosis not present

## 2019-07-19 DIAGNOSIS — E1169 Type 2 diabetes mellitus with other specified complication: Secondary | ICD-10-CM | POA: Diagnosis not present

## 2019-07-19 DIAGNOSIS — R296 Repeated falls: Secondary | ICD-10-CM | POA: Diagnosis not present

## 2019-07-19 DIAGNOSIS — R269 Unspecified abnormalities of gait and mobility: Secondary | ICD-10-CM | POA: Diagnosis not present

## 2019-07-19 DIAGNOSIS — E785 Hyperlipidemia, unspecified: Secondary | ICD-10-CM | POA: Diagnosis not present

## 2019-07-19 DIAGNOSIS — M159 Polyosteoarthritis, unspecified: Secondary | ICD-10-CM | POA: Diagnosis not present

## 2019-07-19 DIAGNOSIS — Z9181 History of falling: Secondary | ICD-10-CM | POA: Diagnosis not present

## 2019-07-19 DIAGNOSIS — R531 Weakness: Secondary | ICD-10-CM | POA: Diagnosis not present

## 2019-07-19 DIAGNOSIS — E0590 Thyrotoxicosis, unspecified without thyrotoxic crisis or storm: Secondary | ICD-10-CM | POA: Diagnosis not present

## 2019-07-19 DIAGNOSIS — R002 Palpitations: Secondary | ICD-10-CM | POA: Diagnosis not present

## 2019-07-19 DIAGNOSIS — I10 Essential (primary) hypertension: Secondary | ICD-10-CM | POA: Diagnosis not present

## 2019-07-22 DIAGNOSIS — E1169 Type 2 diabetes mellitus with other specified complication: Secondary | ICD-10-CM | POA: Diagnosis not present

## 2019-07-22 DIAGNOSIS — R531 Weakness: Secondary | ICD-10-CM | POA: Diagnosis not present

## 2019-07-22 DIAGNOSIS — Z791 Long term (current) use of non-steroidal anti-inflammatories (NSAID): Secondary | ICD-10-CM | POA: Diagnosis not present

## 2019-07-22 DIAGNOSIS — R002 Palpitations: Secondary | ICD-10-CM | POA: Diagnosis not present

## 2019-07-22 DIAGNOSIS — R296 Repeated falls: Secondary | ICD-10-CM | POA: Diagnosis not present

## 2019-07-22 DIAGNOSIS — Z9181 History of falling: Secondary | ICD-10-CM | POA: Diagnosis not present

## 2019-07-22 DIAGNOSIS — Z79899 Other long term (current) drug therapy: Secondary | ICD-10-CM | POA: Diagnosis not present

## 2019-07-22 DIAGNOSIS — E0590 Thyrotoxicosis, unspecified without thyrotoxic crisis or storm: Secondary | ICD-10-CM | POA: Diagnosis not present

## 2019-07-22 DIAGNOSIS — I10 Essential (primary) hypertension: Secondary | ICD-10-CM | POA: Diagnosis not present

## 2019-07-22 DIAGNOSIS — R269 Unspecified abnormalities of gait and mobility: Secondary | ICD-10-CM | POA: Diagnosis not present

## 2019-07-22 DIAGNOSIS — E785 Hyperlipidemia, unspecified: Secondary | ICD-10-CM | POA: Diagnosis not present

## 2019-07-22 DIAGNOSIS — M159 Polyosteoarthritis, unspecified: Secondary | ICD-10-CM | POA: Diagnosis not present

## 2019-07-23 ENCOUNTER — Other Ambulatory Visit: Payer: Self-pay

## 2019-07-23 DIAGNOSIS — Z79899 Other long term (current) drug therapy: Secondary | ICD-10-CM | POA: Diagnosis not present

## 2019-07-23 DIAGNOSIS — R531 Weakness: Secondary | ICD-10-CM | POA: Diagnosis not present

## 2019-07-23 DIAGNOSIS — E785 Hyperlipidemia, unspecified: Secondary | ICD-10-CM | POA: Diagnosis not present

## 2019-07-23 DIAGNOSIS — E0590 Thyrotoxicosis, unspecified without thyrotoxic crisis or storm: Secondary | ICD-10-CM | POA: Diagnosis not present

## 2019-07-23 DIAGNOSIS — R002 Palpitations: Secondary | ICD-10-CM | POA: Diagnosis not present

## 2019-07-23 DIAGNOSIS — E1169 Type 2 diabetes mellitus with other specified complication: Secondary | ICD-10-CM | POA: Diagnosis not present

## 2019-07-23 DIAGNOSIS — I10 Essential (primary) hypertension: Secondary | ICD-10-CM | POA: Diagnosis not present

## 2019-07-23 DIAGNOSIS — R296 Repeated falls: Secondary | ICD-10-CM | POA: Diagnosis not present

## 2019-07-23 DIAGNOSIS — Z9181 History of falling: Secondary | ICD-10-CM | POA: Diagnosis not present

## 2019-07-23 DIAGNOSIS — R269 Unspecified abnormalities of gait and mobility: Secondary | ICD-10-CM | POA: Diagnosis not present

## 2019-07-23 DIAGNOSIS — Z791 Long term (current) use of non-steroidal anti-inflammatories (NSAID): Secondary | ICD-10-CM | POA: Diagnosis not present

## 2019-07-23 DIAGNOSIS — M159 Polyosteoarthritis, unspecified: Secondary | ICD-10-CM | POA: Diagnosis not present

## 2019-07-23 NOTE — Patient Outreach (Signed)
[facility]) Care Management  07/23/2019  Shari [name] Apr 13, 1956 [id number]   Medication Adherence call to Shari [name] [facility] Identifiers Verify spoke with patient she is past due on Metformin 500 mg and Glimepiride 2 mg patient explain she has stop taking both medication because of side effects.patient said her doctor is aware.Shari [name] is showing past due under [place].   [place] Management Direct Dial [phone]  Fax [phone] [email or web address]

## 2019-07-24 DIAGNOSIS — E785 Hyperlipidemia, unspecified: Secondary | ICD-10-CM | POA: Diagnosis not present

## 2019-07-24 DIAGNOSIS — E0590 Thyrotoxicosis, unspecified without thyrotoxic crisis or storm: Secondary | ICD-10-CM | POA: Diagnosis not present

## 2019-07-24 DIAGNOSIS — E1169 Type 2 diabetes mellitus with other specified complication: Secondary | ICD-10-CM | POA: Diagnosis not present

## 2019-07-24 DIAGNOSIS — I10 Essential (primary) hypertension: Secondary | ICD-10-CM | POA: Diagnosis not present

## 2019-07-24 DIAGNOSIS — H0500 Unspecified acute inflammation of orbit: Secondary | ICD-10-CM | POA: Diagnosis not present

## 2019-07-25 DIAGNOSIS — G894 Chronic pain syndrome: Secondary | ICD-10-CM | POA: Diagnosis not present

## 2019-07-25 DIAGNOSIS — M47816 Spondylosis without myelopathy or radiculopathy, lumbar region: Secondary | ICD-10-CM | POA: Diagnosis not present

## 2019-07-25 DIAGNOSIS — Z79891 Long term (current) use of opiate analgesic: Secondary | ICD-10-CM | POA: Diagnosis not present

## 2019-07-25 DIAGNOSIS — M5136 Other intervertebral disc degeneration, lumbar region: Secondary | ICD-10-CM | POA: Diagnosis not present

## 2019-07-26 ENCOUNTER — Encounter: Payer: Self-pay | Admitting: Cardiology

## 2019-07-26 ENCOUNTER — Ambulatory Visit (INDEPENDENT_AMBULATORY_CARE_PROVIDER_SITE_OTHER): Payer: Medicare Other | Admitting: Cardiology

## 2019-07-26 ENCOUNTER — Other Ambulatory Visit: Payer: Self-pay

## 2019-07-26 VITALS — BP 102/66 | HR 59 | Ht 64.0 in | Wt 108.0 lb

## 2019-07-26 DIAGNOSIS — Z791 Long term (current) use of non-steroidal anti-inflammatories (NSAID): Secondary | ICD-10-CM | POA: Diagnosis not present

## 2019-07-26 DIAGNOSIS — R296 Repeated falls: Secondary | ICD-10-CM | POA: Diagnosis not present

## 2019-07-26 DIAGNOSIS — E1169 Type 2 diabetes mellitus with other specified complication: Secondary | ICD-10-CM | POA: Diagnosis not present

## 2019-07-26 DIAGNOSIS — E785 Hyperlipidemia, unspecified: Secondary | ICD-10-CM | POA: Diagnosis not present

## 2019-07-26 DIAGNOSIS — I480 Paroxysmal atrial fibrillation: Secondary | ICD-10-CM

## 2019-07-26 DIAGNOSIS — R531 Weakness: Secondary | ICD-10-CM | POA: Diagnosis not present

## 2019-07-26 DIAGNOSIS — E0590 Thyrotoxicosis, unspecified without thyrotoxic crisis or storm: Secondary | ICD-10-CM | POA: Diagnosis not present

## 2019-07-26 DIAGNOSIS — E782 Mixed hyperlipidemia: Secondary | ICD-10-CM | POA: Diagnosis not present

## 2019-07-26 DIAGNOSIS — R002 Palpitations: Secondary | ICD-10-CM | POA: Diagnosis not present

## 2019-07-26 DIAGNOSIS — I10 Essential (primary) hypertension: Secondary | ICD-10-CM | POA: Diagnosis not present

## 2019-07-26 DIAGNOSIS — M159 Polyosteoarthritis, unspecified: Secondary | ICD-10-CM | POA: Diagnosis not present

## 2019-07-26 DIAGNOSIS — Z9181 History of falling: Secondary | ICD-10-CM | POA: Diagnosis not present

## 2019-07-26 DIAGNOSIS — Z79899 Other long term (current) drug therapy: Secondary | ICD-10-CM | POA: Diagnosis not present

## 2019-07-26 DIAGNOSIS — R269 Unspecified abnormalities of gait and mobility: Secondary | ICD-10-CM | POA: Diagnosis not present

## 2019-07-26 NOTE — Progress Notes (Signed)
Cardiology Office Note:    Date:  07/26/2019   ID:  Shari [name], DOB Nov 18, 1956, MRN [id number]  PCP:  Shari [name], Caroline, MD  Cardiologist:  Shari Brothersajan R Lelend Heinecke, MD   Referring MD: Shari RammingVollmer, Kelly, MD    ASSESSMENT:    1. Paroxysmal atrial fibrillation (HCC)   2. Hyperthyroidism   3. Mixed dyslipidemia    PLAN:    In order of problems listed above:  1. Paroxysmal atrial fibrillation: I discussed my findings with the patient at extensive length.  Patient is very to note that her symptoms are better and she feels much better.  She will continue her current medications 2. Hypothyroidism: Managed by her endocrinologist. 3. Patient will be seen in follow-up appointment in 6 months or earlier if the patient has any concerns    Medication Adjustments/Labs and Tests Ordered: Current medicines are reviewed at length with the patient today.  Concerns regarding medicines are outlined above.  No orders of the defined types were placed in this encounter.  No orders of the defined types were placed in this encounter.    No chief complaint on file.    History of Present Illness:    Shari [name] is a 63 y.o. female.  Patient has past medical history of hypothyroidism and consequent paroxysmal atrial fibrillation.  She has not had any issues with palpitations.  [name] much better now.  She denies any chest pain orthopnea or PND.  She mentions to me that her thyroid is getting better and her beta-blocker dose has been reduced by her physician.  At the time of my evaluation, the patient is alert awake oriented and in no distress.  Past Medical History:  Diagnosis Date  . Eye globe prosthesis   . GERD (gastroesophageal reflux disease)   . History of blood transfusion 1973   "related to abscess burst in my stomach"  . Hyperlipemia   . Hyperlipidemia   . Hypertension   . Osteoarthritis of back    Lowerback   . SVD (spontaneous vaginal delivery)    x 1, baby died at 2 wks of age  .  Type II diabetes mellitus (HCC)     Past Surgical History:  Procedure Laterality Date  . APPENDECTOMY    . COLONOSCOPY    . DILATATION & CURETTAGE/HYSTEROSCOPY WITH MYOSURE N/A 02/25/2015   Procedure: DILATATION & CURETTAGE/HYSTEROSCOPY WITH MYOSURE;  Surgeon: Zelphia CairoGretchen Adkins, MD;  Location: [facility];  Service: Gynecology;  Laterality: N/A;  . DILATION AND CURETTAGE OF UTERUS    . ENUCLEATION Right 03/16/2017  . ENUCLEATION Right 03/16/2017   Procedure: ENUCLEATION RIGHT EYE;  Surgeon: Floydene FlockUsiwoma Ene Abugo, MD;  [facility];  Service: Ophthalmology;  Laterality: Right;  . 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 EYE SURGERY Right ~ 1974   "S/P initial eye injury; scissors stuck in my eye""  . LAPAROSCOPIC CHOLECYSTECTOMY    . SHOULDER ARTHROSCOPY WITH ROTATOR CUFF REPAIR Left    wire stitches per patient  . SHOULDER ARTHROSCOPY WITH ROTATOR CUFF REPAIR Right     Current Medications: Current Meds  Medication Sig  . atenolol (TENORMIN) 25 MG tablet Take 12.5 mg by mouth daily.   Marland Kitchen. atorvastatin (LIPITOR) 40 MG tablet Take 1 tablet by mouth daily.  Marland Kitchen. gentamicin (GARAMYCIN) 0.3 % ophthalmic solution INT 1 GTT INTO LEFT EYE QID FOR 7 DAYS AND DR  . HYDROcodone-acetaminophen (NORCO/VICODIN) 5-325 MG tablet Take 1 tablet  by mouth as needed.  Marland Kitchen. levofloxacin (LEVAQUIN) 750 MG tablet TK 1 T PO QD FOR 10 DAYS  . lisinopril (PRINIVIL,ZESTRIL) 10 MG tablet Take 10 mg by mouth daily.   . meloxicam (MOBIC) 15 MG tablet Take 15 mg by mouth daily.  . methimazole (TAPAZOLE) 10 MG tablet Take 1 tablet by mouth 3 (three) times daily. Takes 2 in am and 1 in pm  . ondansetron (ZOFRAN) 4 MG tablet Take 1 tablet by mouth as needed.  Marland Kitchen. oxyCODONE-acetaminophen (PERCOCET) 7.5-325 MG tablet 3 (three) times daily.  . pantoprazole (PROTONIX) 40 MG tablet Take 40 mg by mouth daily.   Marland Kitchen. PROMETHEGAN 12.5 MG suppository UNW AND I 1 SUP REC Q 8 H PRF NAUSEA OR VOM     Allergies:   Aspirin,  Chocolate, Cocoa, Penicillins, and Sulfa antibiotics   Social History   Socioeconomic History  . Marital status: Widowed    Spouse name: Not on file  . Number of children: Not on file  . Years of education: Not on file  . Highest education level: Not on file  Occupational History  . Not on file  Social Needs  . Financial resource strain: Not on file  . Food insecurity    Worry: Not on file    Inability: Not on file  . Transportation needs    Medical: Not on file    Non-medical: Not on file  Tobacco Use  . Smoking status: Former Smoker    Packs/day: 0.50    Years: 40.00    Pack years: 20.00    Quit date: 2016    Years since quitting: 4.6  . Smokeless tobacco: Never Used  Substance and Sexual Activity  . Alcohol use: Yes    Comment: 03/16/2017 "nothing since 2013"  . Drug use: No  . Sexual activity: Not Currently    Birth control/protection: Post-menopausal  Lifestyle  . Physical activity    Days per week: Not on file    Minutes per session: Not on file  . Stress: Not on file  Relationships  . Social Musicianconnections    Talks on phone: Not on file    Gets together: Not on file    Attends religious service: Not on file    Active member of club or organization: Not on file    Attends meetings of clubs or organizations: Not on file    Relationship status: Not on file  Other Topics Concern  . Not on file  Social History Narrative  . Not on file     Family History: The patient's family history includes CAD in her mother; Diabetes in her mother; Lung cancer in her father.  ROS:   Please see the history of present illness.    All other systems reviewed and are negative.  EKGs/Labs/Other Studies Reviewed:    The following studies were reviewed today: As mentioned above   Recent Labs: 06/05/2019: ALT 16; BUN 21; Creatinine, Ser 0.79; Hemoglobin 10.5; Platelets 337.0; Potassium 4.8; Sodium 136 07/17/2019: TSH <0.01  Recent Lipid Panel No results found for: CHOL, TRIG,  HDL, CHOLHDL, VLDL, LDLCALC, LDLDIRECT  Physical Exam:    VS:  BP 102/66 (BP Location: Right Arm, Patient Position: Sitting, Cuff Size: Normal)   Pulse (!) 59   Ht 5\' 4"  (1.626 m)   Wt 108 lb (49 kg)   SpO2 100%   BMI 18.54 kg/m     Wt Readings from Last 3 Encounters:  07/26/19 108 lb (49 kg)  06/05/19  110 lb (49.9 kg)  05/29/19 111 lb (50.3 kg)     GEN: Patient is in no acute distress HEENT: Normal NECK: No JVD; No carotid bruits LYMPHATICS: No lymphadenopathy CARDIAC: Hear sounds regular, 2/6 systolic murmur at the apex. RESPIRATORY:  Clear to auscultation without rales, wheezing or rhonchi  ABDOMEN: Soft, non-tender, non-distended MUSCULOSKELETAL:  No edema; No deformity  SKIN: Warm and dry NEUROLOGIC:  Alert and oriented x 3 PSYCHIATRIC:  Normal affect   Signed, Jenean Lindau, MD  07/26/2019 3:53 PM    [facility]

## 2019-07-26 NOTE — Patient Instructions (Signed)
Medication Instructions:  Your physician recommends that you continue on your current medications as directed. Please refer to the Current Medication list given to you today.  If you need a refill on your cardiac medications before your next appointment, please call your pharmacy.   Lab work: None If you have labs (blood work) drawn today and your tests are completely normal, you will receive your results only by: . MyChart Message (if you have MyChart) OR . A paper copy in the mail If you have any lab test that is abnormal or we need to change your treatment, we will call you to review the results.  Testing/Procedures: None  Follow-Up: At [facility], you and your health needs are our priority.  As part of our continuing mission to provide you with exceptional heart care, we have created designated Provider Care Teams.  These Care Teams include your primary Cardiologist (physician) and Advanced Practice Providers (APPs -  Physician Assistants and Nurse Practitioners) who all work together to provide you with the care you need, when you need it. You will need a follow up appointment in 6 months. Any Other Special Instructions Will Be Listed Below (If Applicable).    

## 2019-07-31 DIAGNOSIS — I10 Essential (primary) hypertension: Secondary | ICD-10-CM | POA: Diagnosis not present

## 2019-07-31 DIAGNOSIS — M159 Polyosteoarthritis, unspecified: Secondary | ICD-10-CM | POA: Diagnosis not present

## 2019-07-31 DIAGNOSIS — E0590 Thyrotoxicosis, unspecified without thyrotoxic crisis or storm: Secondary | ICD-10-CM | POA: Diagnosis not present

## 2019-07-31 DIAGNOSIS — R269 Unspecified abnormalities of gait and mobility: Secondary | ICD-10-CM | POA: Diagnosis not present

## 2019-07-31 DIAGNOSIS — E1169 Type 2 diabetes mellitus with other specified complication: Secondary | ICD-10-CM | POA: Diagnosis not present

## 2019-08-08 ENCOUNTER — Telehealth: Payer: Self-pay | Admitting: Internal Medicine

## 2019-08-08 NOTE — Telephone Encounter (Signed)
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 , does not meet criteria for f/u or FNA.    Abby Nena Jordan, MD  [facility] Endocrinology  [facility] [address] [place], [place] [address] Phone: [phone] FAX: [phone]

## 2019-08-20 ENCOUNTER — Other Ambulatory Visit: Payer: Self-pay

## 2019-08-20 NOTE — Patient Outreach (Signed)
[facility]) Care Management  08/20/2019  Shari [name] 02-11-1956 [id number]   Medication Adherence call to Shari [name] Compliant Voice message left with a call back number. Shari [name] is showing past due on Metformin 500 mg and Glimepiride 2 mg under [place].  [place] Management Direct Dial [phone]  Fax [phone] [name]

## 2019-08-22 DIAGNOSIS — M47816 Spondylosis without myelopathy or radiculopathy, lumbar region: Secondary | ICD-10-CM | POA: Diagnosis not present

## 2019-08-22 DIAGNOSIS — G894 Chronic pain syndrome: Secondary | ICD-10-CM | POA: Diagnosis not present

## 2019-08-22 DIAGNOSIS — M5136 Other intervertebral disc degeneration, lumbar region: Secondary | ICD-10-CM | POA: Diagnosis not present

## 2019-08-29 ENCOUNTER — Other Ambulatory Visit (INDEPENDENT_AMBULATORY_CARE_PROVIDER_SITE_OTHER): Payer: Medicare Other

## 2019-08-29 ENCOUNTER — Other Ambulatory Visit: Payer: Self-pay

## 2019-08-29 DIAGNOSIS — E0590 Thyrotoxicosis, unspecified without thyrotoxic crisis or storm: Secondary | ICD-10-CM | POA: Diagnosis not present

## 2019-08-29 LAB — TSH: TSH: 0.45 u[IU]/mL (ref 0.35–4.50)

## 2019-08-29 LAB — T4, FREE: Free T4: 0.59 ng/dL — ABNORMAL LOW (ref 0.60–1.60)

## 2019-09-06 ENCOUNTER — Ambulatory Visit: Payer: Medicare Other | Admitting: Internal Medicine

## 2019-09-06 NOTE — Progress Notes (Deleted)
Name: Shari [name]  MRN/ DOB: [id number], 1956-08-16    Age/ Sex: 63 y.o., female     PCP: Philemon KingdomProchnau, Caroline, MD   Reason for Endocrinology Evaluation: Hyperthyroidism     Initial Endocrinology Clinic Visit: 06/05/2019    PATIENT IDENTIFIER: Shari [name] is a 63 y.o., female with a past medical history of T2DM. She has followed with [place] [facility] clinic since 06/05/2019 for consultative assistance with management of her hyperthyroidism.   HISTORICAL SUMMARY: The patient was first diagnosed with hyperthyroidism after presenting to her PCP with a 50 lb weight loss in 04/2019. Her TSH was undetectable at < 0.006 uIU/mL with elevated FT4 3.08 ng/dL and T3 409220 ng/dL . She was started on Methimazole at the time    SUBJECTIVE:   During last visit (06/05/2019): Continued methimazole   Today (09/06/2019):  Shari [name] is here for a follow up on hyperthyroidism. [name] currently on methimazole 1.5 tabs daily    ROS:  As per HPI.   HISTORY:  Past Medical History:  Past Medical History:  Diagnosis Date  . Eye globe prosthesis   . GERD (gastroesophageal reflux disease)   . History of blood transfusion 1973   "related to abscess burst in my stomach"  . Hyperlipemia   . Hyperlipidemia   . Hypertension   . Osteoarthritis of back    Lowerback   . SVD (spontaneous vaginal delivery)    x 1, baby died at 2 wks of age  . Type II diabetes mellitus (HCC)     Past Surgical History:  Past Surgical History:  Procedure Laterality Date  . APPENDECTOMY    . COLONOSCOPY    . DILATATION & CURETTAGE/HYSTEROSCOPY WITH MYOSURE N/A 02/25/2015   Procedure: DILATATION & CURETTAGE/HYSTEROSCOPY WITH MYOSURE;  Surgeon: Zelphia CairoGretchen Adkins, MD;  Location: [facility];  Service: Gynecology;  Laterality: N/A;  . DILATION AND CURETTAGE OF UTERUS    . ENUCLEATION Right 03/16/2017  . ENUCLEATION Right 03/16/2017   Procedure: ENUCLEATION RIGHT EYE;  Surgeon: Floydene FlockUsiwoma Ene Abugo, MD;  [facility];  Service:  Ophthalmology;  Laterality: Right;  . 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 EYE SURGERY Right ~ 1974   "S/P initial eye injury; scissors stuck in my eye""  . LAPAROSCOPIC CHOLECYSTECTOMY    . SHOULDER ARTHROSCOPY WITH ROTATOR CUFF REPAIR Left    wire stitches per patient  . SHOULDER ARTHROSCOPY WITH ROTATOR CUFF REPAIR Right      Social History:  reports that she quit smoking about 4 years ago. Her smoking use included cigarettes. She has a 20.00 pack-year smoking history. She has never used smokeless tobacco. She reports current alcohol use. She reports that she does not use drugs. Family History:  Family History  Problem Relation Age of Onset  . Diabetes Mother   . CAD Mother   . Lung cancer Father       HOME MEDICATIONS: Allergies as of 09/06/2019      Reactions   Aspirin Hives, Itching   Chocolate Hives, Itching   Cocoa Itching, Rash   Penicillins Hives, Itching   Has patient had a PCN reaction causing IMMEDIATE RASH, FACIAL/TONGUE/THROAT SWELLING, SOB, OR LIGHTHEADEDNESS WITH HYPOTENSION:  #  #  #  YES  #  #  #  Has patient had a PCN reaction causing severe rash involving mucus membranes or skin necrosis: No Has patient had a PCN reaction that required hospitalization No  Has patient had a PCN reaction occurring within the last 10 years: No If all of the above answers are "NO", then may proceed with Cephalosporin use.   Sulfa Antibiotics Hives, Itching      Medication List       Accurate as of September 06, 2019  7:23 AM. If you have any questions, ask your nurse or doctor.        atenolol 25 MG tablet Commonly known as: TENORMIN Take 12.5 mg by mouth daily.   atorvastatin 40 MG tablet Commonly known as: LIPITOR Take 1 tablet by mouth daily.   gentamicin 0.3 % ophthalmic solution Commonly known as: GARAMYCIN INT 1 GTT INTO LEFT EYE QID FOR 7 DAYS AND DR   HYDROcodone-acetaminophen 5-325 MG tablet Commonly known as: NORCO/VICODIN Take 1  tablet by mouth as needed.   levofloxacin 750 MG tablet Commonly known as: LEVAQUIN TK 1 T PO QD FOR 10 DAYS   lisinopril 10 MG tablet Commonly known as: ZESTRIL Take 10 mg by mouth daily.   meloxicam 15 MG tablet Commonly known as: MOBIC Take 15 mg by mouth daily.   methimazole 10 MG tablet Commonly known as: TAPAZOLE Take 1 tablet by mouth 3 (three) times daily. Takes 2 in am and 1 in pm   ondansetron 4 MG tablet Commonly known as: ZOFRAN Take 1 tablet by mouth as needed.   oxyCODONE-acetaminophen 7.5-325 MG tablet Commonly known as: PERCOCET 3 (three) times daily.   pantoprazole 40 MG tablet Commonly known as: PROTONIX Take 40 mg by mouth daily.   Promethegan 12.5 MG suppository Generic drug: promethazine UNW AND I 1 SUP REC Q 8 H PRF NAUSEA OR VOM         OBJECTIVE:   PHYSICAL EXAM: VS: There were no vitals taken for this visit.   EXAM: General: Pt appears well and is in NAD  Hydration: Well-hydrated with moist mucous membranes and good skin turgor  Eyes: External eye exam normal without stare, lid lag or exophthalmos.  EOM intact.  PERRL.  Ears, Nose, Throat: Hearing: Grossly intact bilaterally Dental: Good dentition  Throat: Clear without mass, erythema or exudate  Neck: General: Supple without adenopathy. Thyroid: Thyroid size normal.  No goiter or nodules appreciated. No thyroid bruit.  Lungs: Clear with good BS bilat with no rales, rhonchi, or wheezes  Heart: Auscultation: RRR.  Abdomen: Normoactive bowel sounds, soft, nontender, without masses or organomegaly palpable  Extremities: Gait and station: Normal gait  Digits and nails: No clubbing, cyanosis, petechiae, or nodes Head and neck: Normal alignment and mobility BL UE: Normal ROM and strength. BL LE: No pretibial edema normal ROM and strength.  Skin: Hair: Texture and amount normal with gender appropriate distribution Skin Inspection: No rashes, acanthosis nigricans/skin tags. No  lipohypertrophy Skin Palpation: Skin temperature, texture, and thickness normal to palpation  Neuro: Cranial nerves: II - XII grossly intact  Cerebellar: Normal coordination and movement; no tremor Motor: Normal strength throughout DTRs: 2+ and symmetric in UE without delay in relaxation phase  Mental Status: Judgment, insight: Intact Orientation: Oriented to time, place, and person Memory: Intact for recent and remote events Mood and affect: No depression, anxiety, or agitation     DATA REVIEWED: ***    ASSESSMENT / PLAN / RECOMMENDATIONS:   1. ***  Plan:  ***    Medications   ***   Signed electronically by: Lyndle Herrlich, MD  [facility] Endocrinology  [facility] [phone]  [place], [place] [address] Phone: [phone] FAX: [phone]      CC: Ernestene Kiel, MD [address] [place] [address] Phone: [phone]  Fax: [phone]   Return to Endocrinology clinic as below: Future Appointments  Date Time Provider [place]  09/06/2019 10:30 AM Shamleffer, Melanie Crazier, MD [facility] None

## 2019-09-17 DIAGNOSIS — M47816 Spondylosis without myelopathy or radiculopathy, lumbar region: Secondary | ICD-10-CM | POA: Diagnosis not present

## 2019-09-17 DIAGNOSIS — G894 Chronic pain syndrome: Secondary | ICD-10-CM | POA: Diagnosis not present

## 2019-09-17 DIAGNOSIS — M5136 Other intervertebral disc degeneration, lumbar region: Secondary | ICD-10-CM | POA: Diagnosis not present

## 2019-09-18 ENCOUNTER — Other Ambulatory Visit: Payer: Self-pay

## 2019-09-18 DIAGNOSIS — H353123 Nonexudative age-related macular degeneration, left eye, advanced atrophic without subfoveal involvement: Secondary | ICD-10-CM | POA: Diagnosis not present

## 2019-09-20 ENCOUNTER — Encounter: Payer: Self-pay | Admitting: Internal Medicine

## 2019-09-20 ENCOUNTER — Other Ambulatory Visit: Payer: Self-pay

## 2019-09-20 ENCOUNTER — Ambulatory Visit (INDEPENDENT_AMBULATORY_CARE_PROVIDER_SITE_OTHER): Payer: Medicare Other | Admitting: Internal Medicine

## 2019-09-20 VITALS — BP 98/62 | HR 68 | Temp 98.9°F | Ht 64.0 in | Wt 108.8 lb

## 2019-09-20 DIAGNOSIS — E0500 Thyrotoxicosis with diffuse goiter without thyrotoxic crisis or storm: Secondary | ICD-10-CM | POA: Insufficient documentation

## 2019-09-20 DIAGNOSIS — E0590 Thyrotoxicosis, unspecified without thyrotoxic crisis or storm: Secondary | ICD-10-CM

## 2019-09-20 LAB — TSH: TSH: 3.53 u[IU]/mL (ref 0.35–4.50)

## 2019-09-20 LAB — T4, FREE: Free T4: 0.49 ng/dL — ABNORMAL LOW (ref 0.60–1.60)

## 2019-09-20 NOTE — Progress Notes (Signed)
Name: Shari [name]  MRN/ DOB: [id number], 27-Apr-1956    Age/ Sex: 63 y.o., female     PCP: Shari KingdomProchnau, Caroline, MD   Reason for Endocrinology Evaluation: Hyperthyroidism     Initial Endocrinology Clinic Visit: 06/05/2019    PATIENT IDENTIFIER: Shari [name] is a 63 y.o., female with a past medical history of T2DM. She has followed with [place] [facility] clinic since 06/05/2019 for consultative assistance with management of her hyperthyroidism.   HISTORICAL SUMMARY: The patient was first diagnosed with hyperthyroidism after presenting to her PCP with a 50 lb weight loss in 04/2019. Her TSH was undetectable at < 0.006 uIU/mL with elevated FT4 3.08 ng/dL and T3 409220 ng/dL . She was started on Methimazole at the time    SUBJECTIVE:   During last visit (06/05/2019): Continued methimazole   Today (09/20/2019):  Shari [name] is here for a follow up on hyperthyroidism. [name] currently on methimazole 3x daily ( she is supposed to have been on 1.5 tabs daily ) pt tells me she has not been checking the portal and this is how we have been trying to communicate with her.   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  Has occasional left eye burning   Denies diarrheea, anxiety or jittery senstion   NO neck enlargement   ROS:  As per HPI.   HISTORY:  Past Medical History:  Past Medical History:  Diagnosis Date  . Eye globe prosthesis   . GERD (gastroesophageal reflux disease)   . History of blood transfusion 1973   "related to abscess burst in my stomach"  . Hyperlipemia   . Hyperlipidemia   . Hypertension   . Osteoarthritis of back    Lowerback   . SVD (spontaneous vaginal delivery)    x 1, baby died at 2 wks of age  . Type II diabetes mellitus (HCC)    Past Surgical History:  Past Surgical History:  Procedure Laterality Date  . APPENDECTOMY    . COLONOSCOPY    . DILATATION & CURETTAGE/HYSTEROSCOPY WITH MYOSURE N/A  02/25/2015   Procedure: DILATATION & CURETTAGE/HYSTEROSCOPY WITH MYOSURE;  Surgeon: Zelphia CairoGretchen Adkins, MD;  Location: [facility];  Service: Gynecology;  Laterality: N/A;  . DILATION AND CURETTAGE OF UTERUS    . ENUCLEATION Right 03/16/2017  . ENUCLEATION Right 03/16/2017   Procedure: ENUCLEATION RIGHT EYE;  Surgeon: Floydene FlockUsiwoma Ene Abugo, MD;  [facility];  Service: Ophthalmology;  Laterality: Right;  . 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 EYE SURGERY Right ~ 1974   "S/P initial eye injury; scissors stuck in my eye""  . LAPAROSCOPIC CHOLECYSTECTOMY    . SHOULDER ARTHROSCOPY WITH ROTATOR CUFF REPAIR Left    wire stitches per patient  . SHOULDER ARTHROSCOPY WITH ROTATOR CUFF REPAIR Right     Social History:  reports that she quit smoking about 4 years ago. Her smoking use included cigarettes. She has a 20.00 pack-year smoking history. She has never used smokeless tobacco. She reports current alcohol use. She reports that she does not use drugs. Family History:  Family History  Problem Relation Age of Onset  . Diabetes Mother   . CAD Mother   . Lung cancer Father      HOME MEDICATIONS: Allergies as of 09/20/2019      Reactions   Aspirin Hives, Itching  Chocolate Hives, Itching   Cocoa Itching, Rash   Penicillins Hives, Itching   Has patient had a PCN reaction causing IMMEDIATE RASH, FACIAL/TONGUE/THROAT SWELLING, SOB, OR LIGHTHEADEDNESS WITH HYPOTENSION:  #  #  #  YES  #  #  #  Has patient had a PCN reaction causing severe rash involving mucus membranes or skin necrosis: No Has patient had a PCN reaction that required hospitalization No Has patient had a PCN reaction occurring within the last 10 years: No If all of the above answers are "NO", then may proceed with Cephalosporin use.   Sulfa Antibiotics Hives, Itching      Medication List       Accurate as of September 20, 2019  4:49 PM. If you have any questions, ask your nurse or doctor.        atenolol 25  MG tablet Commonly known as: TENORMIN Take 12.5 mg by mouth daily.   atorvastatin 40 MG tablet Commonly known as: LIPITOR Take 1 tablet by mouth daily.   gentamicin 0.3 % ophthalmic solution Commonly known as: GARAMYCIN INT 1 GTT INTO LEFT EYE QID FOR 7 DAYS AND DR   HYDROcodone-acetaminophen 5-325 MG tablet Commonly known as: NORCO/VICODIN Take 1 tablet by mouth as needed.   levofloxacin 750 MG tablet Commonly known as: LEVAQUIN TK 1 T PO QD FOR 10 DAYS   lisinopril 10 MG tablet Commonly known as: ZESTRIL Take 10 mg by mouth daily.   meloxicam 15 MG tablet Commonly known as: MOBIC Take 15 mg by mouth daily.   methimazole 10 MG tablet Commonly known as: TAPAZOLE Take 1 tablet by mouth 3 (three) times daily. Takes 2 in am and 1 in pm   ondansetron 4 MG tablet Commonly known as: ZOFRAN Take 1 tablet by mouth as needed.   oxyCODONE-acetaminophen 7.5-325 MG tablet Commonly known as: PERCOCET 3 (three) times daily.   pantoprazole 40 MG tablet Commonly known as: PROTONIX Take 40 mg by mouth daily.   Promethegan 12.5 MG suppository Generic drug: promethazine UNW AND I 1 SUP REC Q 8 H PRF NAUSEA OR VOM         OBJECTIVE:   PHYSICAL EXAM: VS: BP 98/62 (BP Location: Left Arm, Patient Position: Sitting, Cuff Size: Normal)   Pulse 68   Temp 98.9 F (37.2 C)   Ht 5\' 4"  (1.626 m)   Wt 108 lb 12.8 oz (49.4 kg)   SpO2 98%   BMI 18.68 kg/m    EXAM: General: Pt appears well and is in NAD  Neck: General: Supple without adenopathy. Thyroid: Thyroid size normal.  No goiter or nodules appreciated. No thyroid bruit.  Lungs: Clear with good BS bilat with no rales, rhonchi, or wheezes  Heart: Auscultation: RRR.  Abdomen: Normoactive bowel sounds, soft, nontender, without masses or organomegaly palpable  Extremities:  BL LE: No pretibial edema normal ROM and strength.  Mental Status: Judgment, insight: Intact Orientation: Oriented to time, place, and person Mood and  affect: No depression, anxiety, or agitation     DATA REVIEWED:  Results for Shari [name] (MRN [id number]) as of 09/20/2019 16:49  Ref. Range 09/20/2019 12:02  TSH Latest Ref Range: 0.35 - 4.50 uIU/mL 3.53  T4,Free(Direct) Latest Ref Range: 0.60 - 1.60 ng/dL 0.49 (L)     ASSESSMENT / PLAN / RECOMMENDATIONS:   1. Hyperthyroidism Secondary to Graves' Disease:   - Clincally she is euthyroid  - No local neck symptoms  - Will reduce methimazole dose from 3 tabs  to two tabs daily    Medications   Methimazole 10 mg BID  Labs in 6 weeks     Addendum: Attempted to call the pt but no answer and no voice mail.   A letter was mailed.   Signed electronically by: Lyndle Herrlich, MD  [facility] Endocrinology  [facility] [address] [place], [place] [address] Phone: [phone] FAX: [phone]      CC: Shari Kingdom, MD [address] [place] [address] Phone: [phone]  Fax: [phone]   Return to Endocrinology clinic as below: Future Appointments  Date Time Provider Department Center  11/04/2019 11:45 AM [facility] LAB [facility] None  12/23/2019 10:10 AM Shamleffer, Konrad Dolores, MD [facility] None

## 2019-09-20 NOTE — Patient Instructions (Addendum)
Stop by the labs today     We recommend that you follow these hyperthyroidism instructions at home:  1) Take Methimazole 10 mg three times  a day for now   If you develop severe sore throat with high fevers OR develop unexplained yellowing of your skin, eyes, under your tongue, severe abdominal pain with nausea or vomiting --> then please get evaluated immediately.  2) Get repeat thyroid labs today and in 6 weeks .   It is ESSENTIAL to get follow-up labs to help avoid over or undertreatment of your hyperthyroidism - both of which can be dangerous to your health.

## 2019-10-18 DIAGNOSIS — M5136 Other intervertebral disc degeneration, lumbar region: Secondary | ICD-10-CM | POA: Diagnosis not present

## 2019-10-18 DIAGNOSIS — G894 Chronic pain syndrome: Secondary | ICD-10-CM | POA: Diagnosis not present

## 2019-10-18 DIAGNOSIS — M47816 Spondylosis without myelopathy or radiculopathy, lumbar region: Secondary | ICD-10-CM | POA: Diagnosis not present

## 2019-10-28 ENCOUNTER — Emergency Department (HOSPITAL_COMMUNITY): Payer: Medicare Other

## 2019-10-28 ENCOUNTER — Ambulatory Visit (HOSPITAL_COMMUNITY)
Admission: RE | Admit: 2019-10-28 | Discharge: 2019-10-28 | Disposition: A | Discharge status: Home / Self Care | Payer: Medicare Other | Source: Home / Self Care | Attending: Psychiatry | Admitting: Psychiatry

## 2019-10-28 ENCOUNTER — Emergency Department (HOSPITAL_COMMUNITY)
Admission: EM | Admit: 2019-10-28 | Discharge: 2019-10-29 | Disposition: A | Discharge status: Other Acute Inpatient Hospital | Payer: Medicare Other | Attending: Emergency Medicine | Admitting: Emergency Medicine

## 2019-10-28 ENCOUNTER — Encounter (HOSPITAL_COMMUNITY): Payer: Self-pay

## 2019-10-28 ENCOUNTER — Other Ambulatory Visit: Payer: Self-pay

## 2019-10-28 DIAGNOSIS — E1169 Type 2 diabetes mellitus with other specified complication: Secondary | ICD-10-CM | POA: Diagnosis not present

## 2019-10-28 DIAGNOSIS — F22 Delusional disorders: Secondary | ICD-10-CM | POA: Diagnosis present

## 2019-10-28 DIAGNOSIS — Z91018 Allergy to other foods: Secondary | ICD-10-CM | POA: Insufficient documentation

## 2019-10-28 DIAGNOSIS — I10 Essential (primary) hypertension: Secondary | ICD-10-CM | POA: Insufficient documentation

## 2019-10-28 DIAGNOSIS — Z882 Allergy status to sulfonamides status: Secondary | ICD-10-CM | POA: Insufficient documentation

## 2019-10-28 DIAGNOSIS — F419 Anxiety disorder, unspecified: Secondary | ICD-10-CM | POA: Insufficient documentation

## 2019-10-28 DIAGNOSIS — Z20828 Contact with and (suspected) exposure to other viral communicable diseases: Secondary | ICD-10-CM | POA: Diagnosis not present

## 2019-10-28 DIAGNOSIS — E785 Hyperlipidemia, unspecified: Secondary | ICD-10-CM | POA: Diagnosis not present

## 2019-10-28 DIAGNOSIS — R462 Strange and inexplicable behavior: Secondary | ICD-10-CM | POA: Diagnosis not present

## 2019-10-28 DIAGNOSIS — Z880 Allergy status to penicillin: Secondary | ICD-10-CM | POA: Insufficient documentation

## 2019-10-28 DIAGNOSIS — Z9049 Acquired absence of other specified parts of digestive tract: Secondary | ICD-10-CM | POA: Insufficient documentation

## 2019-10-28 DIAGNOSIS — Z046 Encounter for general psychiatric examination, requested by authority: Secondary | ICD-10-CM | POA: Diagnosis not present

## 2019-10-28 DIAGNOSIS — Z801 Family history of malignant neoplasm of trachea, bronchus and lung: Secondary | ICD-10-CM | POA: Insufficient documentation

## 2019-10-28 DIAGNOSIS — Z87891 Personal history of nicotine dependence: Secondary | ICD-10-CM | POA: Insufficient documentation

## 2019-10-28 DIAGNOSIS — E119 Type 2 diabetes mellitus without complications: Secondary | ICD-10-CM | POA: Insufficient documentation

## 2019-10-28 DIAGNOSIS — F23 Brief psychotic disorder: Secondary | ICD-10-CM | POA: Diagnosis not present

## 2019-10-28 DIAGNOSIS — E0590 Thyrotoxicosis, unspecified without thyrotoxic crisis or storm: Secondary | ICD-10-CM | POA: Diagnosis not present

## 2019-10-28 DIAGNOSIS — Z79899 Other long term (current) drug therapy: Secondary | ICD-10-CM | POA: Diagnosis not present

## 2019-10-28 DIAGNOSIS — Z833 Family history of diabetes mellitus: Secondary | ICD-10-CM | POA: Insufficient documentation

## 2019-10-28 DIAGNOSIS — Z03818 Encounter for observation for suspected exposure to other biological agents ruled out: Secondary | ICD-10-CM | POA: Diagnosis not present

## 2019-10-28 DIAGNOSIS — Z886 Allergy status to analgesic agent status: Secondary | ICD-10-CM | POA: Insufficient documentation

## 2019-10-28 DIAGNOSIS — Z7289 Other problems related to lifestyle: Secondary | ICD-10-CM | POA: Insufficient documentation

## 2019-10-28 DIAGNOSIS — R9431 Abnormal electrocardiogram [ECG] [EKG]: Secondary | ICD-10-CM | POA: Diagnosis not present

## 2019-10-28 DIAGNOSIS — R440 Auditory hallucinations: Secondary | ICD-10-CM | POA: Diagnosis not present

## 2019-10-28 DIAGNOSIS — F29 Unspecified psychosis not due to a substance or known physiological condition: Secondary | ICD-10-CM

## 2019-10-28 DIAGNOSIS — Z8249 Family history of ischemic heart disease and other diseases of the circulatory system: Secondary | ICD-10-CM | POA: Insufficient documentation

## 2019-10-28 DIAGNOSIS — Z008 Encounter for other general examination: Secondary | ICD-10-CM

## 2019-10-28 LAB — CBC WITH DIFFERENTIAL/PLATELET
Abs Immature Granulocytes: 0.01 10*3/uL (ref 0.00–0.07)
Basophils Absolute: 0 10*3/uL (ref 0.0–0.1)
Basophils Relative: 0 %
Eosinophils Absolute: 0 10*3/uL (ref 0.0–0.5)
Eosinophils Relative: 0 %
HCT: 38.2 % (ref 36.0–46.0)
Hemoglobin: 12 g/dL (ref 12.0–15.0)
Immature Granulocytes: 0 %
Lymphocytes Relative: 39 %
Lymphs Abs: 2.8 10*3/uL (ref 0.7–4.0)
MCH: 28.5 pg (ref 26.0–34.0)
MCHC: 31.4 g/dL (ref 30.0–36.0)
MCV: 90.7 fL (ref 80.0–100.0)
Monocytes Absolute: 0.4 10*3/uL (ref 0.1–1.0)
Monocytes Relative: 5 %
Neutro Abs: 4 10*3/uL (ref 1.7–7.7)
Neutrophils Relative %: 56 %
Platelets: 346 10*3/uL (ref 150–400)
RBC: 4.21 MIL/uL (ref 3.87–5.11)
RDW: 15.9 % — ABNORMAL HIGH (ref 11.5–15.5)
WBC: 7.3 10*3/uL (ref 4.0–10.5)
nRBC: 0 % (ref 0.0–0.2)

## 2019-10-28 LAB — COMPREHENSIVE METABOLIC PANEL
ALT: 16 U/L (ref 0–44)
AST: 18 U/L (ref 15–41)
Albumin: 4.1 g/dL (ref 3.5–5.0)
Alkaline Phosphatase: 126 U/L (ref 38–126)
Anion gap: 10 (ref 5–15)
BUN: 8 mg/dL (ref 8–23)
CO2: 26 mmol/L (ref 22–32)
Calcium: 9.8 mg/dL (ref 8.9–10.3)
Chloride: 103 mmol/L (ref 98–111)
Creatinine, Ser: 0.77 mg/dL (ref 0.44–1.00)
GFR calc Af Amer: >60 mL/min (ref >60)
GFR calc non Af Amer: >60 mL/min (ref >60)
Glucose, Bld: 99 mg/dL (ref 70–99)
Potassium: 3.3 mmol/L — ABNORMAL LOW (ref 3.5–5.1)
Sodium: 139 mmol/L (ref 135–145)
Total Bilirubin: 0.8 mg/dL (ref 0.3–1.2)
Total Protein: 7.3 g/dL (ref 6.5–8.1)

## 2019-10-28 LAB — ETHANOL: Alcohol, Ethyl (B): <10 mg/dL (ref <10)

## 2019-10-28 LAB — ACETAMINOPHEN LEVEL: Acetaminophen (Tylenol), Serum: <10 ug/mL — ABNORMAL LOW (ref 10–30)

## 2019-10-28 LAB — TROPONIN I (HIGH SENSITIVITY): Troponin I (High Sensitivity): 2 ng/L (ref <18)

## 2019-10-28 LAB — TSH: TSH: 14.613 u[IU]/mL — ABNORMAL HIGH (ref 0.350–4.500)

## 2019-10-28 LAB — SALICYLATE LEVEL: Salicylate Lvl: <7 mg/dL (ref 2.8–30.0)

## 2019-10-28 MED ORDER — ATENOLOL 25 MG PO TABS
12.5000 mg | ORAL_TABLET | Freq: Every day | ORAL | Status: DC
  Administered 2019-10-29: 12.5 mg via ORAL
  Filled 2019-10-28: qty 0.5

## 2019-10-28 MED ORDER — METHIMAZOLE 10 MG PO TABS
10.0000 mg | ORAL_TABLET | Freq: Every day | ORAL | Status: DC
  Administered 2019-10-29: 10 mg via ORAL
  Filled 2019-10-28: qty 1

## 2019-10-28 MED ORDER — OXYCODONE-ACETAMINOPHEN 7.5-325 MG PO TABS
1.0000 | ORAL_TABLET | Freq: Three times a day (TID) | ORAL | Status: DC
  Administered 2019-10-28 – 2019-10-29 (×3): 1 via ORAL
  Filled 2019-10-28 (×3): qty 1

## 2019-10-28 MED ORDER — ATORVASTATIN CALCIUM 40 MG PO TABS
40.0000 mg | ORAL_TABLET | Freq: Every day | ORAL | Status: DC
  Administered 2019-10-29: 40 mg via ORAL
  Filled 2019-10-28: qty 1

## 2019-10-28 MED ORDER — LISINOPRIL 10 MG PO TABS
10.0000 mg | ORAL_TABLET | Freq: Every day | ORAL | Status: DC
  Administered 2019-10-29: 10 mg via ORAL
  Filled 2019-10-28: qty 1

## 2019-10-28 MED ORDER — PANTOPRAZOLE SODIUM 40 MG PO TBEC
40.0000 mg | DELAYED_RELEASE_TABLET | Freq: Two times a day (BID) | ORAL | Status: DC
  Administered 2019-10-28 – 2019-10-29 (×2): 40 mg via ORAL
  Filled 2019-10-28 (×2): qty 1

## 2019-10-28 NOTE — ED Notes (Signed)
One full and one half pack of new port box , one blk car key, 5 mini key, bag of med  One silver chain  One gold ring  One silver ring  One blk LG cell phone Brown wallet=, [place] id card(2),  Purple wallet=visa 9566, [place] check amount $26.58, city of [place] check amount $104.25, $10(2), $5(1), $1(2), visa 74, [id number], MC 6835, visa 37, MC 59, MC 7766, visa 63, one check book, visa [phone] card 4436 lock up with security

## 2019-10-28 NOTE — ED Triage Notes (Addendum)
Patient states she needs medical clearance. Patient states she is having a lot of issues dealing with her anxiety and feels like it is getting worse.  Patient denies visual or auditory hallucinations, SI, or HI.  Patient went to her PCp today for a check up and informed that she was having a lot of anxiety. PCp told patient to come to the ED.

## 2019-10-28 NOTE — H&P (Signed)
Behavioral Health Medical Screening Exam  Shari [name] is an 63 y.o. female. Patient presents for walk-in assessment accompanied by sister.  Patient assessed by nurse practitioner.  Patient reports "I have been stressed out lately."  Patient denies suicidal and homicidal ideations.  Patient reports auditory hallucinations.  Patient reports "I hear voices telling me bad things like I am ugly."  Patient denies command hallucinations. Patient denies history of suicide attempts, patient denies history of mental health diagnoses.  Patient reports poor sleep, approximately 4 hours each night.  Patient gives verbal consent to speak with sister, Shari [name]. Collateral from patient's sister, [name] Sister Shari [name], who lives with patient, reports patient has been "talking to someone who is not there and talking through the dog."  Per patient's sister patient believes "her dog has been bugged, patient believes she is unable to urinate because someone is watching, patient feels there is a bug in her pocket book."  Today patient called the sheriff's department after she believes she saw a stranger in the home, when sheriff arrived patient was alone in her home.  Total Time spent with patient: 30 minutes  Psychiatric Specialty Exam: Physical Exam  Nursing note and vitals reviewed. Constitutional: [name] oriented to person, place, and time. She appears well-developed.  HENT:  Head: Normocephalic.  Cardiovascular: Normal rate.  Respiratory: Effort normal.  Neurological: [name] alert and oriented to person, place, and time.    Review of Systems  Constitutional: Negative.   HENT: Negative.   Eyes: Negative.   Respiratory: Negative.   Cardiovascular: Negative.   Gastrointestinal: Negative.   Genitourinary: Negative.   Musculoskeletal: Negative.   Skin: Negative.   Neurological: Negative.   Endo/Heme/Allergies: Negative.     Blood pressure 123/77, pulse 90, temperature 99.1 F (37.3 C),  temperature source Oral, resp. rate 16, SpO2 100 %.There is no height or weight on file to calculate BMI.  General Appearance: Casual  Eye Contact:  Good  Speech:  Clear and Coherent and Normal Rate  Volume:  Normal  Mood:  Anxious  Affect:  Restricted  Thought Process:  Coherent, Goal Directed and Descriptions of Associations: Tangential  Orientation:  Full (Time, Place, and Person)  Thought Content:  Hallucinations: Auditory  Suicidal Thoughts:  No  Homicidal Thoughts:  No  Memory:  Immediate;   Fair Recent;   Fair Remote;   Fair  Judgement:  Impaired  Insight:  Lacking  Psychomotor Activity:  Normal  Concentration: Concentration: Fair and Attention Span: Fair  Recall:  [employer] of Knowledge:Good  Language: Good  Akathisia:  No  Handed:  Right  AIMS (if indicated):     Assets:  Communication Skills Desire for Improvement Financial Resources/Insurance Housing  Sleep:       Musculoskeletal: Strength & Muscle Tone: within normal limits Gait & Station: normal Patient leans: N/A  Blood pressure 123/77, pulse 90, temperature 99.1 F (37.3 C), temperature source Oral, resp. rate 16, SpO2 100 %.  Recommendations: Inpatient psychiatric treatment recommended.   Based on my evaluation the patient appears to have an emergency medical condition for which I recommend the patient be transferred to the emergency department for further evaluation.  Emmaline Kluver, FNP 10/28/2019, 4:21 PM

## 2019-10-28 NOTE — BH Assessment (Signed)
Assessment Note  Shari [name] is an 63 y.o. female presenting voluntarily to [facility]. Patient is accompanied by her sister, [name], who waits in the lobby during assessment but provides collateral afterward. Patient is initially guarded in assessment and renders limited history. She reports "anxiety" starting 4 weeks ago. When asked if there are any life stressors she states "I prefer not to talk about it." She reports AH of people saying negative things to her. She denies any visual hallucinations, substance use, or criminal charges. Towards end of assessment, after receiving collateral she admits that she is paranoid and often feels like people are watching her of listening to her. She states she believes that her dog is bugged and earlier this date contacted 911 because she thought she saw someone in her house. Patient gave consent for TTS to speak with her sister, [name].  Per patient's sister, [name]: Patient has become increasingly paranoid over the last 4 weeks. She cannot use the restroom out of fear people are watching her. Patient has called the police numerous times about someone breaking into her house. [name] speaking to people who are not present. [name] not eating or sleeping.   Patient is alert and oriented x 4. [name] dressed appropriately. Her speech is logical, eye contact is good, and her thoughts are organized. Patient's mood is anxious and her affect is congruent. Her insight, judgement, and impulse control are impaired. She does not appear to be responding to internal stimuli during assessment. Patient appears paranoid.  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 Eye globe prosthesis   . GERD (gastroesophageal reflux disease)   . History of blood transfusion 1973   "related to abscess burst in my stomach"  . Hyperlipemia   . Hyperlipidemia   . Hypertension   . Osteoarthritis of back    Lowerback   .  SVD (spontaneous vaginal delivery)    x 1, baby died at 2 wks of age  . Type II diabetes mellitus (HCC)     Past Surgical History:  Procedure Laterality Date  . APPENDECTOMY    . COLONOSCOPY    . DILATATION & CURETTAGE/HYSTEROSCOPY WITH MYOSURE N/A 02/25/2015   Procedure: DILATATION & CURETTAGE/HYSTEROSCOPY WITH MYOSURE;  Surgeon: Zelphia CairoGretchen Adkins, MD;  Location: [facility] ORS;  Service: Gynecology;  Laterality: N/A;  . DILATION AND CURETTAGE OF UTERUS    . ENUCLEATION Right 03/16/2017  . ENUCLEATION Right 03/16/2017   Procedure: ENUCLEATION RIGHT EYE;  Surgeon: Floydene FlockUsiwoma Ene Abugo, MD;  [facility];  Service: Ophthalmology;  Laterality: Right;  . 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 EYE SURGERY Right ~ 1974   "S/P initial eye injury; scissors stuck in my eye""  . LAPAROSCOPIC CHOLECYSTECTOMY    . SHOULDER ARTHROSCOPY WITH ROTATOR CUFF REPAIR Left    wire stitches per patient  . SHOULDER ARTHROSCOPY WITH ROTATOR CUFF REPAIR Right     Family History:  Family History  Problem Relation Age of Onset  . Diabetes Mother   . CAD Mother   . Lung cancer Father     Social History:  reports that she quit smoking about 4 years ago. Her smoking use included cigarettes. She has a 20.00 pack-year smoking history. She has never used smokeless tobacco. She reports current alcohol use. She reports that she does not use drugs.  Additional Social History:  Alcohol /  Drug Use Pain Medications: see MAR Prescriptions: see MAR Over the Counter: see MAR History of alcohol / drug use?: No history of alcohol / drug abuse  CIWA: CIWA-Ar BP: 123/77 Pulse Rate: 90 COWS:    Allergies:  Allergies  Allergen Reactions  . Aspirin Hives and Itching  . Chocolate Hives and Itching  . Cocoa Itching and Rash  . Penicillins Hives and Itching    Has patient had a PCN reaction causing IMMEDIATE RASH, FACIAL/TONGUE/THROAT SWELLING, SOB, OR LIGHTHEADEDNESS WITH HYPOTENSION:  #  #  #  YES  #  #   #  Has patient had a PCN reaction causing severe rash involving mucus membranes or skin necrosis: No Has patient had a PCN reaction that required hospitalization No Has patient had a PCN reaction occurring within the last 10 years: No If all of the above answers are "NO", then may proceed with Cephalosporin use.   . Sulfa Antibiotics Hives and Itching    Home Medications: (Not in a hospital admission)   OB/GYN Status:  No LMP recorded. Patient is postmenopausal.  General Assessment Data Location of Assessment: [facility] Assessment Services TTS Assessment: In system Is this a Tele or Face-to-Face Assessment?: Face-to-Face Is this an Initial Assessment or a Re-assessment for this encounter?: Initial Assessment Patient Accompanied by:: N/A Language Other than English: No Living Arrangements: (sisters home) What gender do you identify as?: Female Marital status: Single Maiden name: Gadway Pregnancy Status: No Living Arrangements: Other relatives Can pt return to current living arrangement?: Yes Admission Status: Voluntary Is patient capable of signing voluntary admission?: Yes Referral Source: Self/Family/Friend Insurance type: Research officer, trade union Exam [facility] Walk-in ONLY) Medical Exam completed: Yes  Crisis Care Plan Living Arrangements: Other relatives Legal Guardian: (self) Name of Psychiatrist: none Name of Therapist: none  Education Status Is patient currently in school?: No Is the patient employed, unemployed or receiving disability?: Receiving disability income  Risk to self with the past 6 months Suicidal Ideation: No Has patient been a risk to self within the past 6 months prior to admission? : No Suicidal Intent: No Has patient had any suicidal intent within the past 6 months prior to admission? : No Is patient at risk for suicide?: No Suicidal Plan?: No Has patient had any suicidal plan within the past 6 months prior to admission? : No Access to  Means: No What has been your use of drugs/alcohol within the last 12 months?: denies Previous Attempts/Gestures: No How many times?: 0 Other Self Harm Risks: none Triggers for Past Attempts: None known Intentional Self Injurious Behavior: None Family Suicide History: No Recent stressful life event(s): Recent negative physical changes Persecutory voices/beliefs?: Yes Depression: Yes Depression Symptoms: Insomnia, Fatigue, Loss of interest in usual pleasures, Feeling angry/irritable Substance abuse history and/or treatment for substance abuse?: No Suicide prevention information given to non-admitted patients: Not applicable  Risk to Others within the past 6 months Homicidal Ideation: No-Not Currently/Within Last 6 Months Does patient have any lifetime risk of violence toward others beyond the six months prior to admission? : No Thoughts of Harm to Others: No-Not Currently Present/Within Last 6 Months Current Homicidal Intent: No Current Homicidal Plan: No Access to Homicidal Means: No Identified Victim: none History of harm to others?: No Assessment of Violence: None Noted Violent Behavior Description: none Does patient have access to weapons?: No Criminal Charges Pending?: No Does patient have a court date: No Is patient on probation?: No  Psychosis Hallucinations: Auditory, Visual Delusions: Persecutory  Mental Status Report Appearance/Hygiene: Unremarkable Eye Contact: Fair Motor Activity: Freedom of movement Speech: Logical/coherent Level of Consciousness: Alert Mood: Anxious Affect: Preoccupied Anxiety Level: Moderate Thought Processes: Coherent, Relevant Judgement: Impaired Orientation: Person, Place, Time, Situation Obsessive Compulsive Thoughts/Behaviors: None  Cognitive Functioning Concentration: Normal Memory: Recent Intact, Remote Intact Is patient IDD: No Insight: Fair Impulse Control: Fair Appetite: Poor Have you had any weight changes? :  Loss Amount of the weight change? (lbs): (UTA) Sleep: Decreased Total Hours of Sleep: 4 Vegetative Symptoms: None  ADLScreening [facility] Assessment Services) Patient's cognitive ability adequate to safely complete daily activities?: Yes Patient able to express need for assistance with ADLs?: Yes Independently performs ADLs?: Yes (appropriate for developmental age)  Prior Inpatient Therapy Prior Inpatient Therapy: No  Prior Outpatient Therapy Prior Outpatient Therapy: No Does patient have an ACCT team?: No Does patient have Intensive In-House Services?  : No Does patient have [facility] services? : No Does patient have P4CC services?: No  ADL Screening (condition at time of admission) Patient's cognitive ability adequate to safely complete daily activities?: Yes Is the patient deaf or have difficulty hearing?: No Does the patient have difficulty seeing, even when wearing glasses/contacts?: No Does the patient have difficulty concentrating, remembering, or making decisions?: No Patient able to express need for assistance with ADLs?: Yes Does the patient have difficulty dressing or bathing?: No Independently performs ADLs?: Yes (appropriate for developmental age) Does the patient have difficulty walking or climbing stairs?: No Weakness of Legs: None Weakness of Arms/Hands: None  Home Assistive Devices/Equipment Home Assistive Devices/Equipment: None  Therapy Consults (therapy consults require a physician order) PT Evaluation Needed: No OT Evalulation Needed: No SLP Evaluation Needed: No Abuse/Neglect Assessment (Assessment to be complete while patient is alone) Abuse/Neglect Assessment Can Be Completed: Yes Physical Abuse: Denies Verbal Abuse: Denies Sexual Abuse: Denies Exploitation of patient/patient's resources: Denies Self-Neglect: Denies Values / Beliefs Cultural Requests During Hospitalization: None Spiritual Requests During Hospitalization: None Consults Spiritual Care  Consult Needed: No Social Work Consult Needed: No Regulatory affairs officer (For Healthcare) Does Patient Have a Medical Advance Directive?: No Would patient like information on creating a medical advance directive?: No - Patient declined          Disposition: Per Letitia Libra, FNP patient meets inpatient criteria. Patient sent to [facility] ED for medical clearance. Disposition Initial Assessment Completed for this Encounter: Yes Disposition of Patient: Movement to [facility] or [facility] ED Patient refused recommended treatment: No Mode of transportation if patient is discharged/movement?: Car  On Site Evaluation by:   Reviewed with Physician:    Orvis Brill 10/28/2019 4:40 PM

## 2019-10-28 NOTE — Progress Notes (Signed)
Received Shari [name] from triage with an escort. She was oriented to her new environment and given fluids and a snack per her request. She was medicated per order and compliant with the Covid test and repeat blood draw. Earlier she stated someone bugged her dog to track her location resulting in additional stress. She slept throughout the night.

## 2019-10-28 NOTE — ED Provider Notes (Signed)
[facility]-EMERGENCY DEPT Provider Note   CSN: [id number] Arrival date & time: 10/28/19  1643     History   Chief Complaint Chief Complaint  Patient presents with  . Medical Clearance    HPI Shari [name] is a 63 y.o. female with a past medical history of DM 2, hyperlipidemia, hypertension, Graves' disease, hyperthyroidism, who presents today for evaluation of anxiety and paranoia. Patient went to see her PCP today for checkup and reportedly was having a lot of anxiety.  According to chart review she has had increasing anxiety for about 4 weeks ago.  She reports new auditory hallucinations with people saying negative things to her.  She again frequently feels like people are watching her or listening to her.  [facility] she reports that her dog is bugged and someone is listening to her.  She reportedly called 911 today because she thought someone was in her house.  She has called the police numerous times for this.  She has reportedly been speaking to people who are not present.       HPI  Past Medical History:  Diagnosis Date  . Eye globe prosthesis   . GERD (gastroesophageal reflux disease)   . History of blood transfusion 1973   "related to abscess burst in my stomach"  . Hyperlipemia   . Hyperlipidemia   . Hypertension   . Osteoarthritis of back    Lowerback   . SVD (spontaneous vaginal delivery)    x 1, baby died at 2 wks of age  . Type II diabetes mellitus [facility](HCC)     Patient Active Problem List   Diagnosis Date Noted  . Graves disease 09/20/2019  . Palpitations 05/29/2019  . Hyperthyroidism 05/29/2019  . Mixed dyslipidemia 05/29/2019  . Paroxysmal atrial fibrillation (HCC) 05/29/2019  . PMB (postmenopausal bleeding) 03/21/2018  . Urinary retention 03/21/2018  . Uterine enlargement 03/21/2018  . Surgery, elective 03/16/2017    Past Surgical History:  Procedure Laterality Date  . APPENDECTOMY    . COLONOSCOPY    .  DILATATION & CURETTAGE/HYSTEROSCOPY WITH MYOSURE N/A 02/25/2015   Procedure: DILATATION & CURETTAGE/HYSTEROSCOPY WITH MYOSURE;  Surgeon: Zelphia CairoGretchen Adkins, MD;  Location: [facility] ORS;  Service: Gynecology;  Laterality: N/A;  . DILATION AND CURETTAGE OF UTERUS    . ENUCLEATION Right 03/16/2017  . ENUCLEATION Right 03/16/2017   Procedure: ENUCLEATION RIGHT EYE;  Surgeon: Floydene FlockUsiwoma Ene Abugo, MD;  [facility];  Service: Ophthalmology;  Laterality: Right;  . 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 EYE SURGERY Right ~ 1974   "S/P initial eye injury; scissors stuck in my eye""  . LAPAROSCOPIC CHOLECYSTECTOMY    . SHOULDER ARTHROSCOPY WITH ROTATOR CUFF REPAIR Left    wire stitches per patient  . SHOULDER ARTHROSCOPY WITH ROTATOR CUFF REPAIR Right      OB History   No obstetric history on file.      Home Medications    Prior to Admission medications   Medication Sig Start Date End Date Taking? Authorizing Provider  atenolol (TENORMIN) 25 MG tablet Take 12.5 mg by mouth daily.  05/15/19  Yes [provider]  atorvastatin (LIPITOR) 40 MG tablet Take 1 tablet by mouth daily.   Yes [provider]  lisinopril (PRINIVIL,ZESTRIL) 10 MG tablet Take 10 mg by mouth daily.  02/03/17  Yes [provider]  methimazole (TAPAZOLE) 10 MG tablet Take 1 tablet by  mouth 2 (two) times daily.  05/15/19  Yes [provider]  oxyCODONE-acetaminophen (PERCOCET) 7.5-325 MG tablet 1 tablet 3 (three) times daily.  07/25/19  Yes [provider]  pantoprazole (PROTONIX) 40 MG tablet Take 40 mg by mouth 2 (two) times daily.  02/03/17  Yes [provider]    Family History Family History  Problem Relation Age of Onset  . Diabetes Mother   . CAD Mother   . Lung cancer Father     Social History Social History   Tobacco Use  . Smoking status: Former Smoker    Packs/day: 0.50    Years: 40.00    Pack years: 20.00    Types: Cigarettes    Quit date:  2016    Years since quitting: 4.9  . Smokeless tobacco: Never Used  Substance Use Topics  . Alcohol use: Yes    Comment: 03/16/2017 "nothing since 2013"  . Drug use: No     Allergies   Aspirin, Chocolate, Cocoa, Penicillins, and Sulfa antibiotics   Review of Systems Review of Systems  Constitutional: Negative for chills and fever.  HENT: Negative for congestion, tinnitus, trouble swallowing and voice change.   Eyes: Negative for visual disturbance.  Respiratory: Negative for choking and shortness of breath.   Gastrointestinal: Negative for abdominal pain.  Genitourinary: Negative for dysuria and vaginal bleeding.  Musculoskeletal: Negative for back pain and neck pain.  Skin: Negative for color change and rash.  Neurological: Negative for weakness and headaches.  Psychiatric/Behavioral: Positive for behavioral problems, confusion, decreased concentration, hallucinations and sleep disturbance. Negative for self-injury. The patient is nervous/anxious and is hyperactive.   All other systems reviewed and are negative.    Physical Exam Updated Vital Signs BP 118/64 (BP Location: Left Arm)   Pulse 64   Temp 98.7 F (37.1 C) (Oral)   Resp 14   Ht  (1.626 m)   Wt 47.6 kg   SpO2 100%   BMI 18.02 kg/m   Physical Exam Vitals signs and nursing note reviewed.  Constitutional:      General: [name] not in acute distress.    Appearance: [name] well-developed. [name] not diaphoretic.     Comments: Thin, appears underweight  HENT:     Head: Normocephalic and atraumatic.  Eyes:     Comments: Eye patch over right eye socket.   Neck:     Musculoskeletal: Normal range of motion.  Cardiovascular:     Rate and Rhythm: Normal rate and regular rhythm.  Pulmonary:     Effort: Pulmonary effort is normal. No respiratory distress.     Breath sounds: No stridor.  Abdominal:     General: There is no distension.  Musculoskeletal:        General: No deformity.  Skin:    General:  Skin is warm and dry.  Neurological:     Mental Status: [name] alert.     Motor: No abnormal muscle tone.     Comments: Patient is awake and alert, oriented to person, place, and time. Normal gait without difficulty.      ED Treatments / Results  Labs (all labs ordered are listed, but only abnormal results are displayed) Labs Reviewed  COMPREHENSIVE METABOLIC PANEL - Abnormal; Notable for the following components:      Result Value   Potassium 3.3 (*)    All other components within normal limits  CBC WITH DIFFERENTIAL/PLATELET - Abnormal; Notable for the following components:   RDW 15.9 (*)  All other components within normal limits  ACETAMINOPHEN LEVEL - Abnormal; Notable for the following components:   Acetaminophen (Tylenol), Serum <10 (*)    All other components within normal limits  TSH - Abnormal; Notable for the following components:   TSH 14.613 (*)    All other components within normal limits  TSH - Abnormal; Notable for the following components:   TSH 10.146 (*)    All other components within normal limits  SARS CORONAVIRUS 2 (TAT 6-24 HRS)  ETHANOL  SALICYLATE LEVEL  RAPID URINE DRUG SCREEN, HOSP PERFORMED  URINALYSIS, COMPLETE (UACMP) WITH MICROSCOPIC  T4, FREE  TROPONIN I (HIGH SENSITIVITY)    EKG EKG Interpretation  Date/Time:  Monday October 28 2019 19:53:37 EST Ventricular Rate:  78 PR Interval:    QRS Duration: 94 QT Interval:  395 QTC Calculation: 450 R Axis:   63 Text Interpretation: Sinus rhythm Supraventricular bigeminy Abnormal R-wave progression, early transition Consider left ventricular hypertrophy No significant change was found v2 ST elevation similar to previous Confirmed by Ezequiel Essex [id number]) on 10/28/2019 8:02:08 PM   Radiology Ct Head Wo Contrast  Result Date: 10/28/2019 CLINICAL DATA:  Hallucinations EXAM: CT HEAD WITHOUT CONTRAST TECHNIQUE: Contiguous axial images were obtained from the base of the skull through the  vertex without intravenous contrast. COMPARISON:  February 08, 2006 FINDINGS: Brain: No evidence of acute infarction, hemorrhage, hydrocephalus, extra-axial collection or mass lesion/mass effect. Calcifications in the left basal ganglia are again noted. Vascular: No hyperdense vessel or unexpected calcification. Skull: Normal. Negative for fracture or focal lesion. Sinuses/Orbits: The sinuses are essentially clear. The mastoid air cells are clear. The left globe is unremarkable. There is a right orbital prosthesis. Other: None. IMPRESSION: No acute intracranial abnormality. Electronically Signed   By: Constance Holster M.D.   On: 10/28/2019 20:15    Procedures Procedures (including critical care time)  Medications Ordered in ED Medications  atenolol (TENORMIN) tablet 12.5 mg (has no administration in time range)  atorvastatin (LIPITOR) tablet 40 mg (has no administration in time range)  lisinopril (ZESTRIL) tablet 10 mg (has no administration in time range)  methimazole (TAPAZOLE) tablet 10 mg (has no administration in time range)  oxyCODONE-acetaminophen (PERCOCET) 7.5-325 MG per tablet 1 tablet (1 tablet Oral Given 10/28/19 2344)  pantoprazole (PROTONIX) EC tablet 40 mg (40 mg Oral Given 10/28/19 2344)  potassium chloride (KLOR-CON) CR tablet 30 mEq (has no administration in time range)     Initial Impression / Assessment and Plan / ED Course  I have reviewed the triage vital signs and the nursing notes.  Pertinent labs & imaging results that were available during my care of the patient were reviewed by me and considered in my medical decision making (see chart for details).  Clinical Course as of Oct 28 108  Mon Oct 28, 2019  2249 Dr. Loanne Drilling from endocrine recommends 1 pill a day and outpatient follow up.    [EH]    Clinical Course User Index [EH] Lorin Glass, PA-C      Patient presents today for evaluation of anxiety, paranoia, and reported auditory hallucinations.  On  exam she does not appear to be responding to internal stimuli however has been noted by family members to be responding to internal stimuli.  Labs were obtained and reviewed, CBC is unremarkable.  CMP shows mild hypokalemia at 3.3.  Oral replacement ordered.  Given that her symptoms started shortly after she had a change in her methimazole dose TSH was checked and  was elevated at 14.6.  Due to the significant abnormality TSH was rechecked and was still elevated at 10.146.  I spoke with Dr. Everardo All, on-call for her endocrinology group, who recommended decreasing her methimazole to 10 mg once a day from 10 mg twice a day.  He does not suspect that this is contributing to her psychiatric symptoms today.    Home medications re-ordered.  Patient medically clear for psychiatric disposition. UA and UDS are pending, however I do not suspect that this would cause her full psychosis.   TTS recommended inpatient treatment.   Final Clinical Impressions(s) / ED Diagnoses   Final diagnoses:  Brief psychotic disorder [facility])  Medical clearance for psychiatric admission    ED Discharge Orders    None       Norman Clay 10/29/19 0112    Glynn Octave, MD 10/29/19 [phone]

## 2019-10-28 NOTE — BH Assessment (Signed)
11/23: Shari Libra, FNP recommends in patient treatment. Sent to [facility] ED for medical clearance. Patient referred to the following hospitals (pending review):  [facility]   [facility]   [facility]  [facility]   [facility]  [facility]    [facility]  [facility]

## 2019-10-29 ENCOUNTER — Encounter (HOSPITAL_COMMUNITY): Payer: Self-pay | Admitting: Registered Nurse

## 2019-10-29 DIAGNOSIS — R462 Strange and inexplicable behavior: Secondary | ICD-10-CM

## 2019-10-29 DIAGNOSIS — R440 Auditory hallucinations: Secondary | ICD-10-CM

## 2019-10-29 DIAGNOSIS — F29 Unspecified psychosis not due to a substance or known physiological condition: Secondary | ICD-10-CM

## 2019-10-29 DIAGNOSIS — F23 Brief psychotic disorder: Secondary | ICD-10-CM

## 2019-10-29 LAB — URINALYSIS, COMPLETE (UACMP) WITH MICROSCOPIC
Bilirubin Urine: NEGATIVE
Glucose, UA: NEGATIVE mg/dL
Hgb urine dipstick: NEGATIVE
Ketones, ur: NEGATIVE mg/dL
Leukocytes,Ua: NEGATIVE
Nitrite: NEGATIVE
Protein, ur: NEGATIVE mg/dL
Specific Gravity, Urine: 1.011 (ref 1.005–1.030)
pH: 5 (ref 5.0–8.0)

## 2019-10-29 LAB — RAPID URINE DRUG SCREEN, HOSP PERFORMED
Amphetamines: NOT DETECTED
Barbiturates: NOT DETECTED
Benzodiazepines: NOT DETECTED
Cocaine: NOT DETECTED
Opiates: POSITIVE — AB
Tetrahydrocannabinol: NOT DETECTED

## 2019-10-29 LAB — SARS CORONAVIRUS 2 (TAT 6-24 HRS): SARS Coronavirus 2: NEGATIVE

## 2019-10-29 LAB — TSH: TSH: 10.146 u[IU]/mL — ABNORMAL HIGH (ref 0.350–4.500)

## 2019-10-29 LAB — T4, FREE: Free T4: 0.4 ng/dL — ABNORMAL LOW (ref 0.61–1.12)

## 2019-10-29 MED ORDER — RISPERIDONE 0.5 MG PO TABS
0.5000 mg | ORAL_TABLET | Freq: Two times a day (BID) | ORAL | Status: DC
  Administered 2019-10-29: 0.5 mg via ORAL
  Filled 2019-10-29: qty 1

## 2019-10-29 MED ORDER — RISPERIDONE 0.5 MG PO TABS: 0.5000 mg | ORAL_TABLET | Freq: Two times a day (BID) | ORAL | 0 refills | Status: DC

## 2019-10-29 MED ORDER — POTASSIUM CHLORIDE CRYS ER 20 MEQ PO TBCR
30.0000 meq | EXTENDED_RELEASE_TABLET | Freq: Once | ORAL | Status: AC
  Administered 2019-10-29: 30 meq via ORAL
  Filled 2019-10-29: qty 1

## 2019-10-29 NOTE — ED Notes (Signed)
Zella Richer RN contacted and is aware that the pt is in transit

## 2019-10-29 NOTE — BH Assessment (Signed)
[facility] Assessment Progress Note  Per Shuvon Rankin, FNP, this pt requires psychiatric hospitalization at this time.  Pt presents under IVC initiated by Lind Covert, LCSW.  The following facilities have been contacted to seek placement for this pt, with results as noted:  Beds available, information sent, decision pending:  [place], [place] Coordinator [phone]

## 2019-10-29 NOTE — ED Notes (Signed)
Sheriff will call back closer to transport time

## 2019-10-29 NOTE — ED Notes (Signed)
Up to the bathroom 

## 2019-10-29 NOTE — Discharge Summary (Signed)
  Patient to be transferred to [place] for inpatient psychiatric treatment

## 2019-10-29 NOTE — Consult Note (Signed)
Telepsych Consultation   Reason for Consult: Bizarre behavior and hallucinations Referring Physician: Dr. Lucianne Muss Location of Patient: Shari [name] Location of Provider: [facility]  Patient Identification: Shari [name]  MRN:  [id number] Principal Diagnosis: Psychotic disorder [facility]) Diagnosis:  Principal Problem:   Psychotic disorder (HCC)   Total Time spent with patient: 45 minutes  Subjective:   Per TTS Assessment Note; reviewed by this provider:  [name] is an 63 y.o. female presenting voluntarily to [facility] for assessment. Patient is accompanied by her sister, Shari [name], who waits in the lobby during assessment but provides collateral afterward. Patient is initially guarded in assessment and renders limited history. She reports "anxiety" starting 4 weeks ago. When asked if there are any life stressors she states "I prefer not to talk about it." She reports AH of people saying negative things to her. She denies any visual hallucinations, substance use, or criminal charges. Towards end of assessment, after receiving collateral she admits that she is paranoid and often feels like people are watching her of listening to her. She states she believes that her dog is bugged and earlier this date contacted 911 because she thought she saw someone in her house. Patient gave consent for TTS to speak with her sister, Shari [name]. Per patient's sister, Shari [name]: Patient has become increasingly paranoid over the last 4 weeks. She cannot use the restroom out of fear people are watching her. Patient has called the police numerous times about someone breaking into her house. [name] speaking to people who are not present. [name] not eating or sleeping.  Patient is alert and oriented x 4. [name] dressed appropriately. Her speech is logical, eye contact is good, and her thoughts are organized. Patient's mood is anxious and her affect is congruent. Her insight, judgement, and impulse control are impaired. She does not  appear to be responding to internal stimuli during assessment. Patient appears paranoid.  HPI:  Shari [name], 63 y.o., female patient seen via tele psych by this provider, Dr. Lucianne Muss; and chart reviewed on 10/29/19.  On evaluation Shari [name] reports patient states that she does not have a psychiatric history.  Patient reports she has a history of Graves' disease.  Patient states that she was brought to the hospital because someone had booked her door " they are watching everything that I do even when I go to the bathroom they watch everything I do and it has been going on for weeks or more."  Patient asked if there was any family that we could talk to for collateral information she responded yes and gave [place] the phone number and the name of her Sister Shari [name].  Once patient finished given information patient stated "They hurt me given you the number and they took the number to now they get her her number.  Shari [name] in and she has keys to protect. During evaluation Shari [name] is alert/oriented x 3; calm/cooperative.  [name] responding to internal/external stimuli and delusional thoughts that someone is watching her and trying to harm her.  Patient denies suicidal/self-harm/homicidal ideation.  Patient is also denying psychosis, and paranoia; but it is apparent to harm Shari [name] the patient is paranoid with auditory hallucinations..  Patient answered question appropriately.    Collateral Information form patient sister Shari [name] [phone]): Shari [name] reports that patient moved in with her about a month and a half ago " because somebody was trying to break into her apartment."  I was states that patient started having  problems around June or July making statements that somebody was watching her and trying to break into her apartment.  States that patient's friend had also made statements about patient making comments of someone watching her.  States that patient did not have a prior psychiatric history that she  knows of.  States patient lived in [place] until 1 year ago.  But from her contact with her sister she does not know of any prior psychiatric issues.  States that her sister has been having issues with her for thyroid more recently and that her primary doctor had referred her to her endocrinologist.  States that she became more concerned when patient started talking to her self " she saw some man named Shari [name] who was in some facility which she said was in the holes.  She would be up at night talking to herself.  I just do not know what is happening with her.."  Past Psychiatric History: Patient patient and her sister reports no prior psychiatric history  Risk to Self:   Risk to Others:   Prior Inpatient Therapy:   Prior Outpatient Therapy:    Past Medical History:  Past Medical History:  Diagnosis Date  . Eye globe prosthesis   . GERD (gastroesophageal reflux disease)   . History of blood transfusion 1973   "related to abscess burst in my stomach"  . Hyperlipemia   . Hyperlipidemia   . Hypertension   . Osteoarthritis of back    Lowerback   . SVD (spontaneous vaginal delivery)    x 1, baby died at 2 wks of age  . Type II diabetes mellitus (HCC)     Past Surgical History:  Procedure Laterality Date  . APPENDECTOMY    . COLONOSCOPY    . DILATATION & CURETTAGE/HYSTEROSCOPY WITH MYOSURE N/A 02/25/2015   Procedure: DILATATION & CURETTAGE/HYSTEROSCOPY WITH MYOSURE;  Surgeon: Zelphia CairoGretchen Adkins, MD;  Location: [facility];  Service: Gynecology;  Laterality: N/A;  . DILATION AND CURETTAGE OF UTERUS    . ENUCLEATION Right 03/16/2017  . ENUCLEATION Right 03/16/2017   Procedure: ENUCLEATION RIGHT EYE;  Surgeon: Floydene FlockUsiwoma Ene Abugo, MD;  [facility];  Service: Ophthalmology;  Laterality: Right;  . 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 EYE SURGERY Right ~ 1974   "S/P initial eye injury; scissors stuck in my eye""  . LAPAROSCOPIC CHOLECYSTECTOMY    . SHOULDER ARTHROSCOPY WITH  ROTATOR CUFF REPAIR Left    wire stitches per patient  . SHOULDER ARTHROSCOPY WITH ROTATOR CUFF REPAIR Right    Family History:  Family History  Problem Relation Age of Onset  . Diabetes Mother   . CAD Mother   . Lung cancer Father    Family Psychiatric  History: Unaware Social History:  Social History   Substance and Sexual Activity  Alcohol Use Yes   Comment: 03/16/2017 "nothing since 2013"     Social History   Substance and Sexual Activity  Drug Use No    Social History   Socioeconomic History  . Marital status: Widowed    Spouse name: Not on file  . Number of children: Not on file  . Years of education: Not on file  . Highest education level: Not on file  Occupational History  . Not on file  Social Needs  . Financial resource strain: Not on file  . Food insecurity    Worry: Not on file    Inability: Not  on file  . Transportation needs    Medical: Not on file    Non-medical: Not on file  Tobacco Use  . Smoking status: Former Smoker    Packs/day: 0.50    Years: 40.00    Pack years: 20.00    Types: Cigarettes    Quit date: 2016    Years since quitting: 4.9  . Smokeless tobacco: Never Used  Substance and Sexual Activity  . Alcohol use: Yes    Comment: 03/16/2017 "nothing since 2013"  . Drug use: No  . Sexual activity: Not Currently    Birth control/protection: Post-menopausal  Lifestyle  . Physical activity    Days per week: Not on file    Minutes per session: Not on file  . Stress: Not on file  Relationships  . Social Musician on phone: Not on file    Gets together: Not on file    Attends religious service: Not on file    Active member of club or organization: Not on file    Attends meetings of clubs or organizations: Not on file    Relationship status: Not on file  Other Topics Concern  . Not on file  Social History Narrative  . Not on file   Additional Social History:    Allergies:   Allergies  Allergen Reactions  . Aspirin  Hives and Itching  . Chocolate Hives and Itching  . Cocoa Itching and Rash  . Penicillins Hives and Itching    Has patient had a PCN reaction causing IMMEDIATE RASH, FACIAL/TONGUE/THROAT SWELLING, SOB, OR LIGHTHEADEDNESS WITH HYPOTENSION:  #  #  #  YES  #  #  #  Has patient had a PCN reaction causing severe rash involving mucus membranes or skin necrosis: No Has patient had a PCN reaction that required hospitalization No Has patient had a PCN reaction occurring within the last 10 years: No If all of the above answers are "NO", then may proceed with Cephalosporin use.   . Sulfa Antibiotics Hives and Itching    Labs:  Results for orders placed or performed during the hospital encounter of 10/28/19 (from the past 48 hour(s))  Comprehensive metabolic panel     Status: Abnormal   Collection Time: 10/28/19  7:25 PM  Result Value Ref Range   Sodium 139 135 - 145 mmol/L   Potassium 3.3 (L) 3.5 - 5.1 mmol/L   Chloride 103 98 - 111 mmol/L   CO2 26 22 - 32 mmol/L   Glucose, Bld 99 70 - 99 mg/dL   BUN 8 8 - 23 mg/dL   Creatinine, Ser 1.61 0.44 - 1.00 mg/dL   Calcium 9.8 8.9 - 09.6 mg/dL   Total Protein 7.3 6.5 - 8.1 g/dL   Albumin 4.1 3.5 - 5.0 g/dL   AST 18 15 - 41 U/L   ALT 16 0 - 44 U/L   Alkaline Phosphatase 126 38 - 126 U/L   Total Bilirubin 0.8 0.3 - 1.2 mg/dL   GFR calc non Af Amer >60 >60 mL/min   GFR calc Af Amer >60 >60 mL/min   Anion gap 10 5 - 15    Comment: Performed at [facility], [address] [place], [place] [address]  Ethanol     Status: None   Collection Time: 10/28/19  7:25 PM  Result Value Ref Range   Alcohol, Ethyl (B) <10 <10 mg/dL    Comment: (NOTE) Lowest detectable limit for serum alcohol is 10  mg/dL. For medical purposes only. Performed at [facility], [place] [address] [place], [place] [address]   CBC with Diff     Status: Abnormal   Collection Time: 10/28/19  7:25 PM  Result Value Ref Range   WBC 7.3 4.0 - 10.5  K/uL   RBC 4.21 3.87 - 5.11 MIL/uL   Hemoglobin 12.0 12.0 - 15.0 g/dL   HCT 38.2 36.0 - 46.0 %   MCV 90.7 80.0 - 100.0 fL   MCH 28.5 26.0 - 34.0 pg   MCHC 31.4 30.0 - 36.0 g/dL   RDW 15.9 (H) 11.5 - 15.5 %   Platelets 346 150 - 400 K/uL   nRBC 0.0 0.0 - 0.2 %   Neutrophils Relative % 56 %   Neutro Abs 4.0 1.7 - 7.7 K/uL   Lymphocytes Relative 39 %   Lymphs Abs 2.8 0.7 - 4.0 K/uL   Monocytes Relative 5 %   Monocytes Absolute 0.4 0.1 - 1.0 K/uL   Eosinophils Relative 0 %   Eosinophils Absolute 0.0 0.0 - 0.5 K/uL   Basophils Relative 0 %   Basophils Absolute 0.0 0.0 - 0.1 K/uL   Immature Granulocytes 0 %   Abs Immature Granulocytes 0.01 0.00 - 0.07 K/uL    Comment: Performed at [facility], [place] [address] [place], [place] [address]  Acetaminophen level     Status: Abnormal   Collection Time: 10/28/19  7:25 PM  Result Value Ref Range   Acetaminophen (Tylenol), Serum <10 (L) 10 - 30 ug/mL    Comment: (NOTE) Therapeutic concentrations vary significantly. A range of 10-30 ug/mL  may be an effective concentration for many patients. However, some  are best treated at concentrations outside of this range. Acetaminophen concentrations >150 ug/mL at 4 hours after ingestion  and >50 ug/mL at 12 hours after ingestion are often associated with  toxic reactions. Performed at [facility], [place] [address] [place], [place] [address]   Salicylate level     Status: None   Collection Time: 10/28/19  7:25 PM  Result Value Ref Range   Salicylate Lvl <6.4 2.8 - 30.0 mg/dL    Comment: Performed at [facility], [place] [address], [place], [place] [address]  TSH     Status: Abnormal   Collection Time: 10/28/19  7:25 PM  Result Value Ref Range   TSH 14.613 (H) 0.350 - 4.500 uIU/mL    Comment: Performed by a 3rd Generation assay with a functional sensitivity of <=0.01 uIU/mL. Performed at [facility], [place] [address],  [place], [place] [address]   Troponin I (High Sensitivity)     Status: None   Collection Time: 10/28/19  7:25 PM  Result Value Ref Range   Troponin I (High Sensitivity) 2 <18 ng/L    Comment: (NOTE) Elevated high sensitivity troponin I (hsTnI) values and significant  changes across serial measurements may suggest ACS but many other  chronic and acute conditions are known to elevate hsTnI results.  Refer to the "Links" section for chest pain algorithms and additional  guidance. Performed at [facility], [place] [address] [place],  [address]   T4, free     Status: Abnormal   Collection Time: 10/28/19 10:15 PM  Result Value Ref Range   Free T4 0.40 (L) 0.61 - 1.12 ng/dL    Comment: (NOTE) Biotin ingestion may interfere with free T4 tests. If the results are inconsistent with the TSH level,  previous test results, or the clinical presentation, then consider biotin interference. If needed, order repeat testing after stopping biotin. Performed at [facility], [address] [place], [place] [address]   TSH     Status: Abnormal   Collection Time: 10/28/19 10:24 PM  Result Value Ref Range   TSH 10.146 (H) 0.350 - 4.500 uIU/mL    Comment: Performed by a 3rd Generation assay with a functional sensitivity of <=0.01 uIU/mL. Performed at [facility], [address] [place], [place] [address]   SARS CORONAVIRUS 2 (TAT 6-24 HRS) Nasopharyngeal Nasopharyngeal Swab     Status: None   Collection Time: 10/28/19 10:29 PM   Specimen: Nasopharyngeal Swab  Result Value Ref Range   SARS Coronavirus 2 NEGATIVE NEGATIVE    Comment: (NOTE) SARS-CoV-2 target nucleic acids are NOT DETECTED. The SARS-CoV-2 RNA is generally detectable in upper and lower respiratory specimens during the acute phase of infection. Negative results do not preclude SARS-CoV-2 infection, do not rule out co-infections with other pathogens, and should not be used as the sole basis  for treatment or other patient management decisions. Negative results must be combined with clinical observations, patient history, and epidemiological information. The expected result is Negative. Fact Sheet for Patients: HairSlick.no Fact Sheet for Healthcare Providers: quierodirigir.com This test is not yet approved or cleared by the [place] FDA and  has been authorized for detection and/or diagnosis of SARS-CoV-2 by FDA under an Emergency Use Authorization (EUA). This EUA will remain  in effect (meaning this test can be used) for the duration of the COVID-19 declaration under Section 56 4(b)(1) of the Act, 21 U.S.C. section 360bbb-3(b)(1), unless the authorization is terminated or revoked sooner. Performed at [facility], [address], [place], [place] [address]   Urine rapid drug screen (hosp performed)     Status: Abnormal   Collection Time: 10/29/19  5:36 AM  Result Value Ref Range   Opiates POSITIVE (A) NONE DETECTED   Cocaine NONE DETECTED NONE DETECTED   Benzodiazepines NONE DETECTED NONE DETECTED   Amphetamines NONE DETECTED NONE DETECTED   Tetrahydrocannabinol NONE DETECTED NONE DETECTED   Barbiturates NONE DETECTED NONE DETECTED    Comment: (NOTE) DRUG SCREEN FOR MEDICAL PURPOSES ONLY.  IF CONFIRMATION IS NEEDED FOR ANY PURPOSE, NOTIFY LAB WITHIN 5 DAYS. LOWEST DETECTABLE LIMITS FOR URINE DRUG SCREEN Drug Class                     Cutoff (ng/mL) Amphetamine and metabolites    1000 Barbiturate and metabolites    200 Benzodiazepine                 200 Tricyclics and metabolites     300 Opiates and metabolites        300 Cocaine and metabolites        300 THC                            50 Performed at [facility], [address] [place], [place] [address]   Urinalysis, Complete w Microscopic     Status: Abnormal   Collection Time: 10/29/19  5:36 AM  Result Value Ref Range    Color, Urine YELLOW YELLOW   APPearance CLEAR CLEAR   Specific Gravity, Urine 1.011 1.005 - 1.030   pH 5.0 5.0 - 8.0   Glucose, UA NEGATIVE NEGATIVE mg/dL   Hgb urine  dipstick NEGATIVE NEGATIVE   Bilirubin Urine NEGATIVE NEGATIVE   Ketones, ur NEGATIVE NEGATIVE mg/dL   Protein, ur NEGATIVE NEGATIVE mg/dL   Nitrite NEGATIVE NEGATIVE   Leukocytes,Ua NEGATIVE NEGATIVE   RBC / HPF 0-5 0 - 5 RBC/hpf   WBC, UA 0-5 0 - 5 WBC/hpf   Bacteria, UA RARE (A) NONE SEEN   Squamous Epithelial / LPF 0-5 0 - 5   Mucus PRESENT     Comment: Performed at [facility], [address] [place], [place] [address]    Medications:  Current Facility-Administered Medications  Medication Dose Route Frequency Provider Last Rate Last Dose  . atenolol (TENORMIN) tablet 12.5 mg  12.5 mg Oral Daily Cristina Gong, PA-C   12.5 mg at 10/29/19 1024  . atorvastatin (LIPITOR) tablet 40 mg  40 mg Oral q1800 Cristina Gong, PA-C      . lisinopril (ZESTRIL) tablet 10 mg  10 mg Oral Daily Cristina Gong, [place]   10 mg at 10/29/19 1025  . methimazole (TAPAZOLE) tablet 10 mg  10 mg Oral Daily Cristina Gong, PA-C   10 mg at 10/29/19 1026  . oxyCODONE-acetaminophen (PERCOCET) 7.5-325 MG per tablet 1 tablet  1 tablet Oral TID Cristina Gong, PA-C   1 tablet at 10/29/19 1025  . pantoprazole (PROTONIX) EC tablet 40 mg  40 mg Oral BID Cristina Gong, PA-C   40 mg at 10/29/19 1026   Current Outpatient Medications  Medication Sig Dispense Refill  . atenolol (TENORMIN) 25 MG tablet Take 12.5 mg by mouth daily.     Marland Kitchen atorvastatin (LIPITOR) 40 MG tablet Take 1 tablet by mouth daily.    Marland Kitchen lisinopril (PRINIVIL,ZESTRIL) 10 MG tablet Take 10 mg by mouth daily.     . methimazole (TAPAZOLE) 10 MG tablet Take 1 tablet by mouth 2 (two) times daily.     Marland Kitchen oxyCODONE-acetaminophen (PERCOCET) 7.5-325 MG tablet 1 tablet 3 (three) times daily.     . pantoprazole (PROTONIX) 40 MG tablet Take  40 mg by mouth 2 (two) times daily.       Musculoskeletal: Strength & Muscle Tone: within normal limits Gait & Station: normal Patient leans: N/A  Psychiatric Specialty Exam: Physical Exam  Nursing note and vitals reviewed. Constitutional: [name] oriented to person, place, and time. No distress.  Neck: Normal range of motion.  Respiratory: Effort normal.  Musculoskeletal: Normal range of motion.  Neurological: [name] alert and oriented to person, place, and time.  Skin: Skin is warm and dry.  Psychiatric: Her speech is normal. Her mood appears anxious. [name] actively hallucinating. Thought content is paranoid and delusional.    Review of Systems  Endo/Heme/Allergies:       Patient reports history of Graves disease; trouble with thyroid levels recently.    All other systems reviewed and are negative.   Blood pressure (!) 116/58, pulse 67, temperature 98.4 F (36.9 C), temperature source Oral, resp. rate 17, height  (1.626 m), weight 47.6 kg, SpO2 100 %.Body mass index is 18.02 kg/m.  General Appearance: Casual  Eye Contact:  Good  Speech:  Blocked and Normal Rate  Volume:  Normal  Mood:  Anxious  Affect:  Labile  Thought Process:  Linear and Descriptions of Associations: Tangential  Orientation:  Full (Time, Place, and Person)  Thought Content:  Delusions, Hallucinations: Auditory, Paranoid Ideation and Tangential  Suicidal Thoughts:  No  Homicidal Thoughts:  No  Memory:  Immediate;  Poor Recent;   Poor  Judgement:  Impaired  Insight:  Fair  Psychomotor Activity:  Normal  Concentration:  Concentration: Poor and Attention Span: Poor  Recall:  Poor  Fund of Knowledge:  Fair  Language:  Fair  Akathisia:  No  Handed:  Right  AIMS (if indicated):     Assets:  Communication Skills Housing Social Support  ADL's:  Intact  Cognition:  WNL  Sleep:       Treatment Plan Summary: Daily contact with patient to assess and evaluate symptoms and progress in treatment,  Medication management and Plan Inpatient psychiatric treatment  Disposition: Recommend psychiatric Inpatient admission when medically cleared.  This service was provided via telemedicine using a 2-way, interactive audio and video technology.  Names of all persons participating in this telemedicine service and their role in this encounter. Name: Assunta Found Role: NP  Name: Dr. Lucianne Muss Role: Psychiatrist  Name: [name] Role: Patient  Name:  Role:     Assunta Found, NP 10/29/2019 12:49 PM

## 2019-10-29 NOTE — Care Management (Signed)
Patient accepted to [facility] in [place]; per Ricka Burdock  Accepting Dr. Demetrius Revel The number to give report is [phone] The date and time that the patient can be transported any time before 11pm tonight or11-25-2020 after 6am   Writer informed the RN

## 2019-10-29 NOTE — ED Notes (Signed)
Pt given contact information to give to family

## 2019-10-29 NOTE — ED Notes (Addendum)
Pt ambulatory w/o difficulty w/ sheriff to [facility] in [place].  IVC papers, EMTELA, facesheet, transfer report, MAR report, assessment note, EKG, and belongings given to sheriff.  This Probation officer spoke with pt's sister (with her permission) and contact information was given to her.

## 2019-10-29 NOTE — ED Notes (Signed)
Sheriff contacted for transport 

## 2019-10-29 NOTE — BH Assessment (Addendum)
[place] Assessment Progress Note  Per Shuvon Rankin, FNP, this pt requires psychiatric hospitalization.  Lind Covert, LCSW  also finds that pt meets criteria for IVC, which she has initiated.  IVC documents have been faxed to [facility], and at [employer] confirms receipt.  He has since faxed Findings and Custody Order to this Probation officer.  At 15:25  I called [employer] and spoke to Mining engineer 35, who took demographic information, agreeing to dispatch law enforcement to fill out Return of Service.  As of this writing, arrival of law enforcement is pending.  Pt has been referred to [facility].  Final disposition is pending as of this writing.  Jalene Mullet, [place] Behavioral Health Coordinator [phone]  Addendum:  Lanny Cramp enforcement has presented at [facility] and completed Return of Service.  Jalene Mullet, [place] Coordinator [phone]

## 2019-10-29 NOTE — ED Notes (Signed)
telepsych eval with dr Dwyane Dee

## 2019-10-29 NOTE — Progress Notes (Signed)
IVC initiated by LCSW under the clinical supervision of Dr. Dwyane Dee, MD.  Lind Covert, MSW, [place] Therapeutic Triage Specialist  [phone]

## 2019-10-29 NOTE — ED Notes (Signed)
Pt denies si/hi/ah at this time.  PT reports that she has been having increased anxiety and that the "voices" trigger her anxiety.  Pt reports that the voices started 4-5 weeks ago and that they "watch everything I do..."  Pt reports that it is not one specific person that watches her, but multiple.  Pt reports that she lives withi her sister and saw her primary care yesterday and was recommended to come in for eval.

## 2019-10-29 NOTE — ED Notes (Signed)
Watching tv, nad.

## 2019-10-29 NOTE — ED Notes (Signed)
Nad, watching tv 

## 2019-10-30 DIAGNOSIS — F23 Brief psychotic disorder: Secondary | ICD-10-CM | POA: Insufficient documentation

## 2019-11-01 DIAGNOSIS — E0500 Thyrotoxicosis with diffuse goiter without thyrotoxic crisis or storm: Secondary | ICD-10-CM | POA: Diagnosis not present

## 2019-11-01 DIAGNOSIS — I10 Essential (primary) hypertension: Secondary | ICD-10-CM | POA: Diagnosis not present

## 2019-11-02 DIAGNOSIS — E0500 Thyrotoxicosis with diffuse goiter without thyrotoxic crisis or storm: Secondary | ICD-10-CM | POA: Diagnosis not present

## 2019-11-02 DIAGNOSIS — I10 Essential (primary) hypertension: Secondary | ICD-10-CM | POA: Diagnosis not present

## 2019-11-03 DIAGNOSIS — E0500 Thyrotoxicosis with diffuse goiter without thyrotoxic crisis or storm: Secondary | ICD-10-CM | POA: Diagnosis not present

## 2019-11-03 DIAGNOSIS — I10 Essential (primary) hypertension: Secondary | ICD-10-CM | POA: Diagnosis not present

## 2019-11-04 ENCOUNTER — Other Ambulatory Visit: Payer: Medicare Other

## 2019-11-05 ENCOUNTER — Other Ambulatory Visit: Payer: Self-pay

## 2019-11-08 ENCOUNTER — Ambulatory Visit: Payer: Medicare Other | Admitting: Internal Medicine

## 2019-11-08 NOTE — Progress Notes (Deleted)
Name: Shari [name]  MRN/ DOB: [id number], 06-Jul-1956    Age/ Sex: 63 y.o., female     PCP: Philemon Kingdom, MD   Reason for Endocrinology Evaluation: Hyperthyroidism     Initial Endocrinology Clinic Visit: 06/05/2019    PATIENT IDENTIFIER: Shari [name] is a 63 y.o., female with a past medical history of T2DM. She has followed with [facility] since 06/05/2019 for consultative assistance with management of her hyperthyroidism.   HISTORICAL SUMMARY: The patient was first diagnosed with hyperthyroidism after presenting to her PCP with a 50 lb weight loss in 04/2019. Her TSH was undetectable at < 0.006 uIU/mL with elevated FT4 3.08 ng/dL and T3 657 ng/dL . She was started on Methimazole at the time    SUBJECTIVE:   During last visit (09/20/2019): Reduced  methimazole from 3 tabs to 2 tabs  Today (11/08/2019):  [name] is here for a follow up on hyperthyroidism. Since her last visit here , she has been to the ED for a psychotic episode. Her TSh was 14.0 uIU/mL. Dr. Everardo All was on call and advised to reduce methimazole to 1 tablet daily.   [name] currently on methimazole 1x daily ( she is supposed to have been on 1.5 tabs daily ) pt tells me she has not been checking the portal and this is how we have been trying to communicate with her.   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  Has occasional left eye burning   Denies diarrheea, anxiety or jittery senstion   No neck enlargement   ROS:  As per HPI.   HISTORY:  Past Medical History:  Past Medical History:  Diagnosis Date  . Eye globe prosthesis   . GERD (gastroesophageal reflux disease)   . History of blood transfusion 1973   "related to abscess burst in my stomach"  . Hyperlipemia   . Hyperlipidemia   . Hypertension   . Osteoarthritis of back    Lowerback   . SVD (spontaneous vaginal delivery)    x 1, baby died at 2 wks of age  .  Type II diabetes mellitus (HCC)    Past Surgical History:  Past Surgical History:  Procedure Laterality Date  . APPENDECTOMY    . COLONOSCOPY    . DILATATION & CURETTAGE/HYSTEROSCOPY WITH MYOSURE N/A 02/25/2015   Procedure: DILATATION & CURETTAGE/HYSTEROSCOPY WITH MYOSURE;  Surgeon: [name], MD;  Location: [facility];  Service: Gynecology;  Laterality: N/A;  . DILATION AND CURETTAGE OF UTERUS    . ENUCLEATION Right 03/16/2017  . ENUCLEATION Right 03/16/2017   Procedure: ENUCLEATION RIGHT EYE;  Surgeon: [name], MD;  [facility];  Service: Ophthalmology;  Laterality: Right;  . 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 EYE SURGERY Right ~ 1974   "S/P initial eye injury; scissors stuck in my eye""  . LAPAROSCOPIC CHOLECYSTECTOMY    . SHOULDER ARTHROSCOPY WITH ROTATOR CUFF REPAIR Left    wire stitches per patient  . SHOULDER ARTHROSCOPY WITH ROTATOR CUFF REPAIR Right     Social History:  reports that she quit smoking about 4 years ago. Her smoking use included cigarettes. She has a 20.00 pack-year smoking history. She has never used smokeless tobacco. She reports current alcohol use. She reports that she does not use drugs. Family History:  Family History  Problem  Relation Age of Onset  . Diabetes Mother   . CAD Mother   . Lung cancer Father      HOME MEDICATIONS: Allergies as of 11/08/2019      Reactions   Aspirin Hives, Itching   Chocolate Hives, Itching   Cocoa Itching, Rash   Penicillins Hives, Itching   Has patient had a PCN reaction causing IMMEDIATE RASH, FACIAL/TONGUE/THROAT SWELLING, SOB, OR LIGHTHEADEDNESS WITH HYPOTENSION:  #  #  #  YES  #  #  #  Has patient had a PCN reaction causing severe rash involving mucus membranes or skin necrosis: No Has patient had a PCN reaction that required hospitalization No Has patient had a PCN reaction occurring within the last 10 years: No If all of the above answers are "NO", then may proceed with  Cephalosporin use.   Sulfa Antibiotics Hives, Itching      Medication List       Accurate as of November 08, 2019 12:45 PM. If you have any questions, ask your nurse or doctor.        atenolol 25 MG tablet Commonly known as: TENORMIN Take 12.5 mg by mouth daily.   atorvastatin 40 MG tablet Commonly known as: LIPITOR Take 1 tablet by mouth daily.   lisinopril 10 MG tablet Commonly known as: ZESTRIL Take 10 mg by mouth daily.   methimazole 10 MG tablet Commonly known as: TAPAZOLE Take 1 tablet by mouth 2 (two) times daily.   oxyCODONE-acetaminophen 7.5-325 MG tablet Commonly known as: PERCOCET 1 tablet 3 (three) times daily.   pantoprazole 40 MG tablet Commonly known as: PROTONIX Take 40 mg by mouth 2 (two) times daily.   risperiDONE 0.5 MG tablet Commonly known as: RISPERDAL Take 1 tablet (0.5 mg total) by mouth 2 (two) times daily.         OBJECTIVE:   PHYSICAL EXAM: VS: There were no vitals taken for this visit.   EXAM: General: Pt appears well and is in NAD  Neck: General: Supple without adenopathy. Thyroid: Thyroid size normal.  No goiter or nodules appreciated. No thyroid bruit.  Lungs: Clear with good BS bilat with no rales, rhonchi, or wheezes  Heart: Auscultation: RRR.  Abdomen: Normoactive bowel sounds, soft, nontender, without masses or organomegaly palpable  Extremities:  BL LE: No pretibial edema normal ROM and strength.  Mental Status: Judgment, insight: Intact Orientation: Oriented to time, place, and person Mood and affect: No depression, anxiety, or agitation     DATA REVIEWED:  Results for LEVIA, WALTERMIRE (MRN [id number]) as of 11/08/2019 12:49  Ref. Range 09/20/2019 12:02 10/28/2019 19:25 10/28/2019 22:15 10/28/2019 22:24  TSH Latest Ref Range: 0.350 - 4.500 uIU/mL 3.53 14.613 (H)  10.146 (H)  T4,Free(Direct) Latest Ref Range: 0.61 - 1.12 ng/dL 0.49 (L)  0.40 (L)     ASSESSMENT / PLAN / RECOMMENDATIONS:   1. 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   A letter was mailed.   Signed electronically by: Mack Guise, MD  [facility]  [facility] [address], [place] [address] Phone: [phone] FAX: [phone]      CC: Prochnau,  Rayfield Citizenaroline, MD [address] [place] [id number] Phone: [phone]  Fax: [phone]   Return to Endocrinology clinic as below: Future Appointments  Date Time Provider Department Center  11/08/2019  1:40 PM Kwasi Joung, Konrad DoloresIbtehal Jaralla, MD [facility] None  12/23/2019 10:10 AM Alfreddie Consalvo, Konrad DoloresIbtehal Jaralla, MD [facility] None

## 2019-11-25 DIAGNOSIS — M5136 Other intervertebral disc degeneration, lumbar region: Secondary | ICD-10-CM | POA: Diagnosis not present

## 2019-11-25 DIAGNOSIS — M47816 Spondylosis without myelopathy or radiculopathy, lumbar region: Secondary | ICD-10-CM | POA: Diagnosis not present

## 2019-11-25 DIAGNOSIS — G894 Chronic pain syndrome: Secondary | ICD-10-CM | POA: Diagnosis not present

## 2019-12-03 ENCOUNTER — Other Ambulatory Visit: Payer: Medicare Other

## 2019-12-19 ENCOUNTER — Other Ambulatory Visit: Payer: Self-pay

## 2019-12-23 ENCOUNTER — Other Ambulatory Visit: Payer: Self-pay

## 2019-12-23 ENCOUNTER — Ambulatory Visit: Payer: Medicare Other | Admitting: Internal Medicine

## 2019-12-23 ENCOUNTER — Encounter: Payer: Self-pay | Admitting: Internal Medicine

## 2019-12-23 VITALS — BP 104/64 | HR 66 | Temp 97.9°F | Ht 64.0 in | Wt 127.4 lb

## 2019-12-23 DIAGNOSIS — E0590 Thyrotoxicosis, unspecified without thyrotoxic crisis or storm: Secondary | ICD-10-CM

## 2019-12-23 LAB — TSH: TSH: 16.24 u[IU]/mL — ABNORMAL HIGH (ref 0.35–4.50)

## 2019-12-23 LAB — T4, FREE: Free T4: 0.42 ng/dL — ABNORMAL LOW (ref 0.60–1.60)

## 2019-12-23 NOTE — Progress Notes (Signed)
Name: Shari [name]  MRN/ DOB: [id number], [birth year]    Age/ Sex: 64 y.o., female     PCP: Philemon Kingdom, MD   Reason for Endocrinology Evaluation: Hyperthyroidism     Initial Endocrinology Clinic Visit: 06/05/2019    PATIENT IDENTIFIER: [name] is a 64 y.o., female with a past medical history of T2DM. She has followed with [facility] since 06/05/2019 for consultative assistance with management of her hyperthyroidism.   HISTORICAL SUMMARY: The patient was first diagnosed with hyperthyroidism after presenting to her PCP with a 50 lb weight loss in 04/2019. Her TSH was undetectable at < 0.006 uIU/mL with elevated FT4 3.08 ng/dL and T3 295 ng/dL . She was started on Methimazole at the time    SUBJECTIVE:   During last visit (06/05/2019): Continued methimazole   Today (12/23/2019):  [name] is here for a follow up on hyperthyroidism. [name] currently on methimazole 3x daily ( she is supposed to have been on 1.5 tabs daily ) pt tells me she has not been checking the portal and this is how we have been trying to communicate with her.   Weight has increased. 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   No neck enlargement   Recent admission for psychotic disorder   ROS:  As per HPI.   HISTORY:  Past Medical History:  Past Medical History:  Diagnosis Date  . Eye globe prosthesis   . GERD (gastroesophageal reflux disease)   . History of blood transfusion 1973   "related to abscess burst in my stomach"  . Hyperlipemia   . Hyperlipidemia   . Hypertension   . Osteoarthritis of back    Lowerback   . SVD (spontaneous vaginal delivery)    x 1, baby died at 2 wks of age  . Type II diabetes mellitus (HCC)    Past Surgical History:  Past Surgical History:  Procedure Laterality Date  . APPENDECTOMY    . COLONOSCOPY    . DILATATION & CURETTAGE/HYSTEROSCOPY  WITH MYOSURE N/A 02/25/2015   Procedure: DILATATION & CURETTAGE/HYSTEROSCOPY WITH MYOSURE;  Surgeon: Shari Cairo, MD;  Location: [facility];  Service: Gynecology;  Laterality: N/A;  . DILATION AND CURETTAGE OF UTERUS    . ENUCLEATION Right 03/16/2017  . ENUCLEATION Right 03/16/2017   Procedure: ENUCLEATION RIGHT EYE;  Surgeon: Shari Flock, MD;  Location: [facility] OR;  Service: Ophthalmology;  Laterality: Right;  . 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 EYE SURGERY Right ~ 1974   "S/P initial eye injury; scissors stuck in my eye""  . LAPAROSCOPIC CHOLECYSTECTOMY    . SHOULDER ARTHROSCOPY WITH ROTATOR CUFF REPAIR Left    wire stitches per patient  . SHOULDER ARTHROSCOPY WITH ROTATOR CUFF REPAIR Right     Social History:  reports that she quit smoking about 5 years ago. Her smoking use included cigarettes. She has a 20.00 pack-year smoking history. She has never used smokeless tobacco. She reports current alcohol use. She reports that she does not use drugs. Family History:  Family History  Problem Relation Age of Onset  . Diabetes Mother   . CAD Mother   . Lung cancer Father      HOME MEDICATIONS: Allergies as of 12/23/2019      Reactions   Aspirin Hives, Itching   Chocolate  Hives, Itching   Cocoa Itching, Rash   Penicillins Hives, Itching   Has patient had a PCN reaction causing IMMEDIATE RASH, FACIAL/TONGUE/THROAT SWELLING, SOB, OR LIGHTHEADEDNESS WITH HYPOTENSION:  #  #  #  YES  #  #  #  Has patient had a PCN reaction causing severe rash involving mucus membranes or skin necrosis: No Has patient had a PCN reaction that required hospitalization No Has patient had a PCN reaction occurring within the last 10 years: No If all of the above answers are "NO", then may proceed with Cephalosporin use.   Sulfa Antibiotics Hives, Itching      Medication List       Accurate as of December 23, 2019 10:35 AM. If you have any questions, ask your nurse or doctor.          ARIPiprazole 10 MG tablet Commonly known as: ABILIFY Take 10 mg by mouth daily.   atenolol 25 MG tablet Commonly known as: TENORMIN Take 12.5 mg by mouth daily.   atorvastatin 40 MG tablet Commonly known as: LIPITOR Take 1 tablet by mouth daily.   lisinopril 10 MG tablet Commonly known as: ZESTRIL Take 10 mg by mouth daily.   methimazole 10 MG tablet Commonly known as: TAPAZOLE Take 1 tablet by mouth 2 (two) times daily.   OLANZapine 10 MG tablet Commonly known as: ZYPREXA Take 10 mg by mouth at bedtime.   oxyCODONE-acetaminophen 7.5-325 MG tablet Commonly known as: PERCOCET 1 tablet 3 (three) times daily.   pantoprazole 40 MG tablet Commonly known as: PROTONIX Take 40 mg by mouth 2 (two) times daily.   risperiDONE 0.5 MG tablet Commonly known as: RISPERDAL Take 1 tablet (0.5 mg total) by mouth 2 (two) times daily.   traZODone 50 MG tablet Commonly known as: DESYREL Take 25-50 mg by mouth at bedtime.         OBJECTIVE:   PHYSICAL EXAM: VS: BP 104/64 (BP Location: Left Arm, Patient Position: Sitting, Cuff Size: Normal)   Pulse 66   Temp 97.9 F (36.6 C)   Ht 5\' 4"  (1.626 m)   Wt 127 lb 6.4 oz (57.8 kg)   SpO2 99%   BMI 21.87 kg/m    EXAM: General: Pt appears well and is in NAD  Neck: General: Supple without adenopathy. Thyroid: Thyroid size normal.  No goiter or nodules appreciated. No thyroid bruit.  Lungs: Clear with good BS bilat with no rales, rhonchi, or wheezes  Heart: Auscultation: RRR.  Abdomen: Normoactive bowel sounds, soft, nontender, without masses or organomegaly palpable  Extremities:  BL LE: No pretibial edema normal ROM and strength.  Mental Status: Judgment, insight: Intact Orientation: Oriented to time, place, and person Mood and affect: No depression, anxiety, or agitation     DATA REVIEWED:  Results for Shari, [name] (MRN [id number]) as of 12/24/2019 12:15  Ref. Range 12/23/2019 10:49  TSH Latest Ref Range: 0.35 - 4.50  uIU/mL 16.24 (H)  T4,Free(Direct) Latest Ref Range: 0.60 - 1.60 ng/dL 0.42 (L)    ASSESSMENT / PLAN / RECOMMENDATIONS:   1. Hyperthyroidism Secondary to Graves' Disease:   - Clincally she is euthyroid - No local neck symptoms  - Will reduce methimazole dose from 2 tabs to 1 tab daily    Medications   Decrease  methimazole 10 mg to 1 tablet daily   Follow-up in 4 months  Labs in 8 weeks  Signed electronically by: Mack Guise, MD  [facility]  [facility]  [address] [place], [place] [address] Phone: [phone] FAX: [phone]      CC: Philemon Kingdom, MD [address] [place] [address] Phone: [phone]  Fax: [phone]   Return to Endocrinology clinic as below: No future appointments.

## 2019-12-23 NOTE — Patient Instructions (Signed)
   We recommend that you follow these hyperthyroidism instructions at home:  1) Take Methimazole 10 mg  Daily   If you develop severe sore throat with high fevers OR develop unexplained yellowing of your skin, eyes, under your tongue, severe abdominal pain with nausea or vomiting --> then please get evaluated immediately.  2) Get repeat thyroid labs today and in 8 weeks .   It is ESSENTIAL to get follow-up labs to help avoid over or undertreatment of your hyperthyroidism - both of which can be dangerous to your health.

## 2019-12-24 ENCOUNTER — Encounter: Payer: Self-pay | Admitting: Internal Medicine

## 2020-01-13 DIAGNOSIS — M5136 Other intervertebral disc degeneration, lumbar region: Secondary | ICD-10-CM | POA: Diagnosis not present

## 2020-01-13 DIAGNOSIS — G894 Chronic pain syndrome: Secondary | ICD-10-CM | POA: Diagnosis not present

## 2020-01-13 DIAGNOSIS — Z79891 Long term (current) use of opiate analgesic: Secondary | ICD-10-CM | POA: Diagnosis not present

## 2020-01-15 DIAGNOSIS — Z79899 Other long term (current) drug therapy: Secondary | ICD-10-CM | POA: Diagnosis not present

## 2020-01-15 DIAGNOSIS — J439 Emphysema, unspecified: Secondary | ICD-10-CM | POA: Diagnosis not present

## 2020-01-15 DIAGNOSIS — E0590 Thyrotoxicosis, unspecified without thyrotoxic crisis or storm: Secondary | ICD-10-CM | POA: Diagnosis not present

## 2020-01-15 DIAGNOSIS — H353122 Nonexudative age-related macular degeneration, left eye, intermediate dry stage: Secondary | ICD-10-CM | POA: Diagnosis not present

## 2020-01-15 DIAGNOSIS — D649 Anemia, unspecified: Secondary | ICD-10-CM | POA: Diagnosis not present

## 2020-01-15 DIAGNOSIS — E785 Hyperlipidemia, unspecified: Secondary | ICD-10-CM | POA: Diagnosis not present

## 2020-01-15 DIAGNOSIS — I700 Atherosclerosis of aorta: Secondary | ICD-10-CM | POA: Diagnosis not present

## 2020-01-15 DIAGNOSIS — I10 Essential (primary) hypertension: Secondary | ICD-10-CM | POA: Diagnosis not present

## 2020-01-31 IMAGING — CT CT HEAD W/O CM
3 series · 15 of 47 positions shown, 18 images · non-contrast
Comparison: February 08, 2006

CLINICAL DATA: Hallucinations

EXAM:
CT HEAD WITHOUT CONTRAST
TECHNIQUE: Contiguous axial images were obtained from the base of the skull
through the vertex without intravenous contrast.

[Series 2: head wo · axial · 0.40mm/px · z∈[-147,-22]mm · 9 of 30 slices shown, 12 images]
[im 3/30  brain]
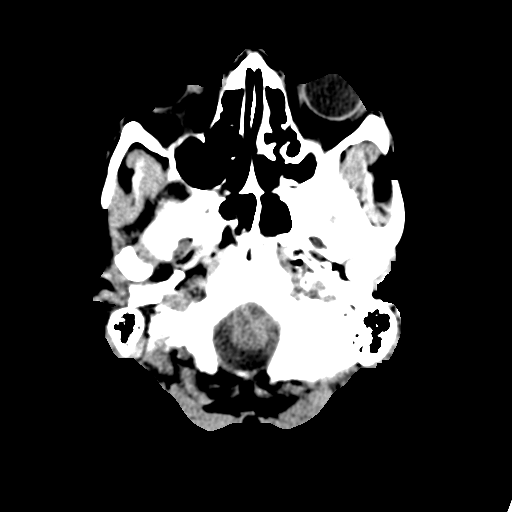
[im 3/30  bone]
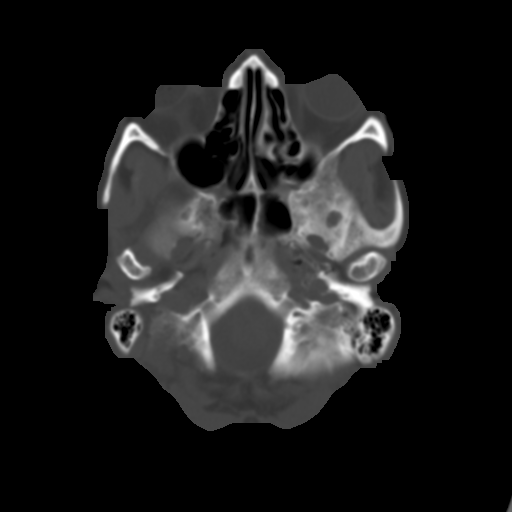
[im 6/30  brain]
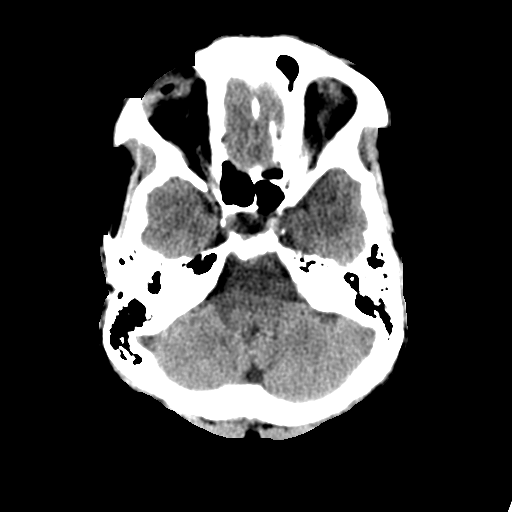
[im 9/30  brain]
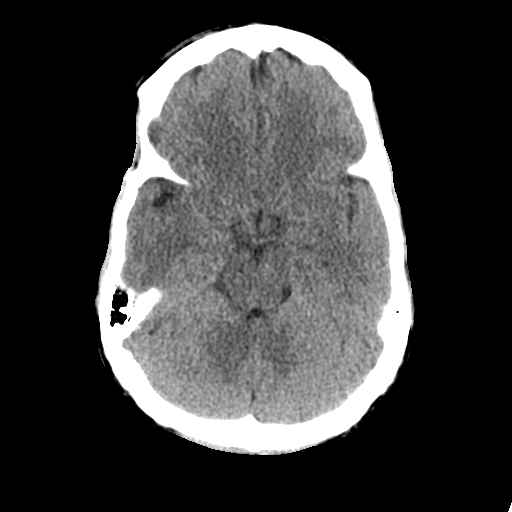
[im 12/30  brain]
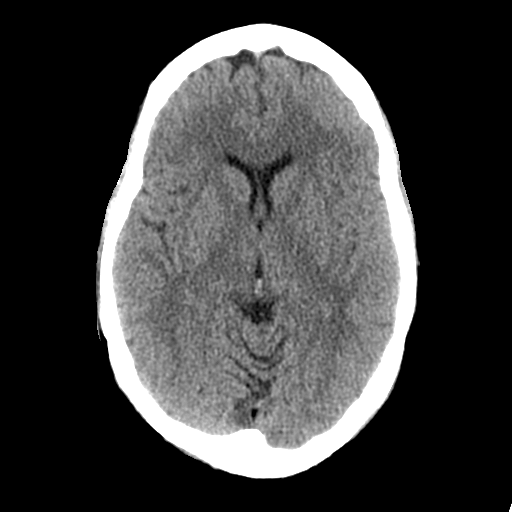
[im 16/30  brain]
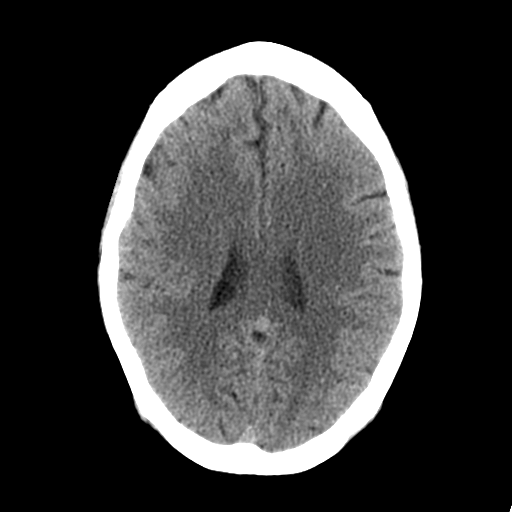
[im 16/30  bone]
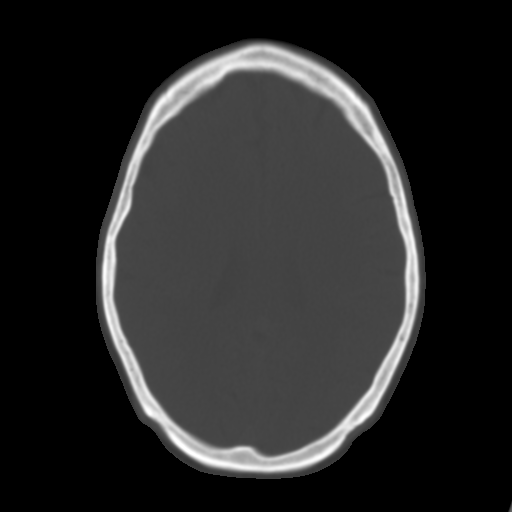
[im 19/30  brain]
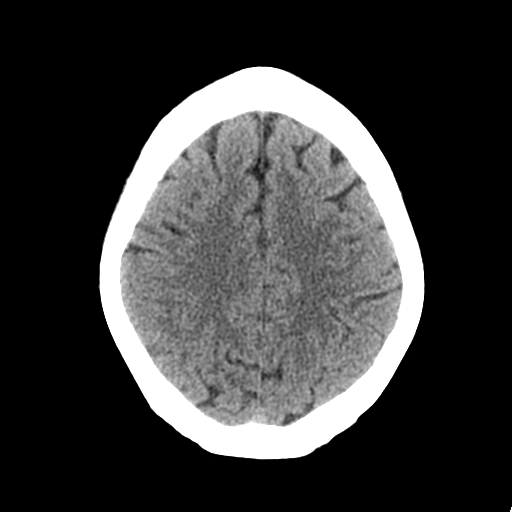
[im 22/30  brain]
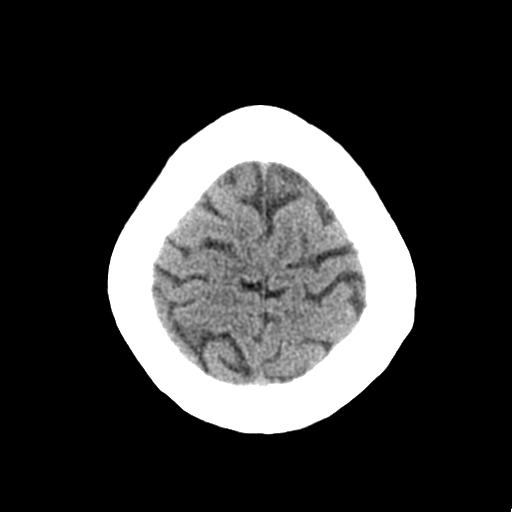
[im 25/30  brain]
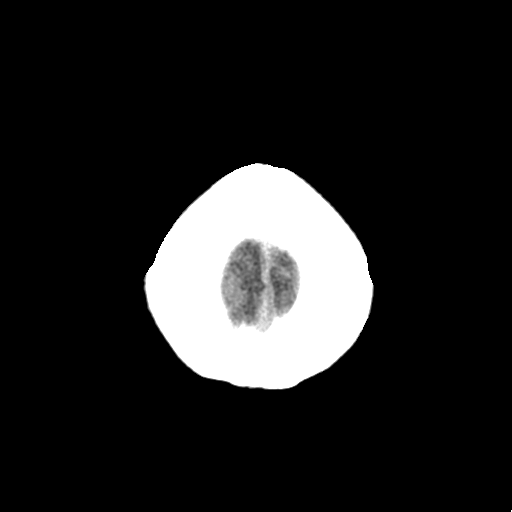
[im 28/30  brain]
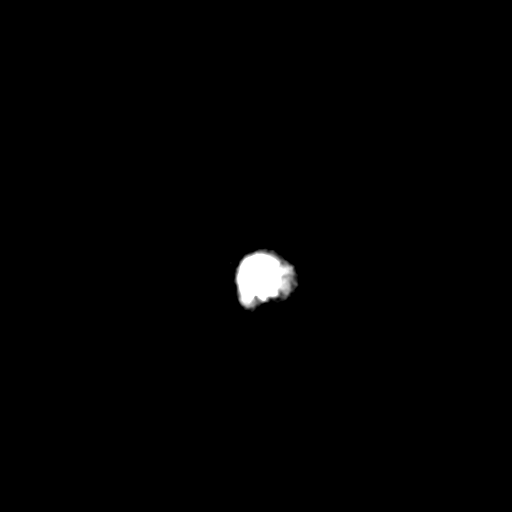
[im 28/30  bone]
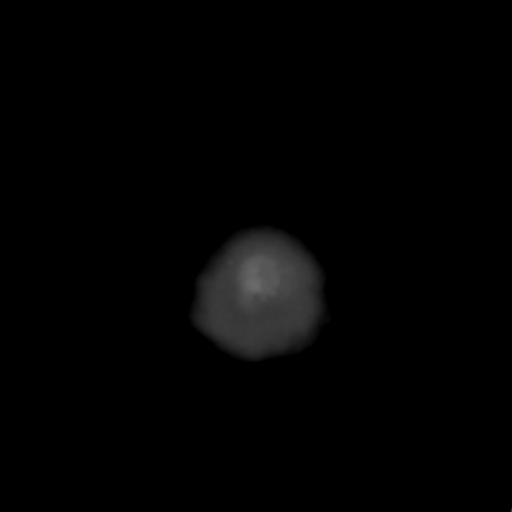

[Series 5: coronal soft tissue · coronal · 0.28mm/px · 3 of 69 slices shown]
[im 23/69  brain]
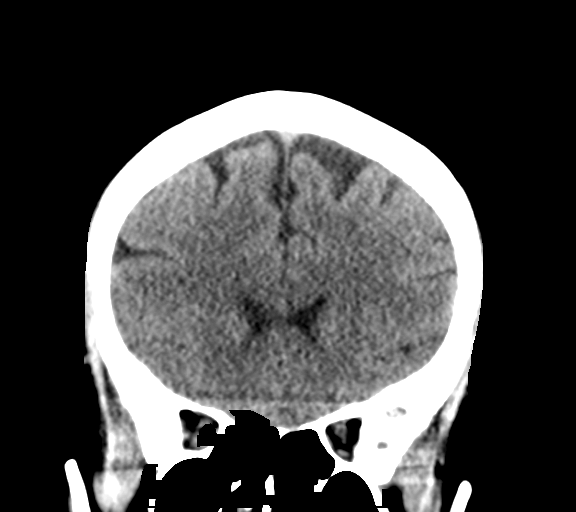
[im 31/69  brain]
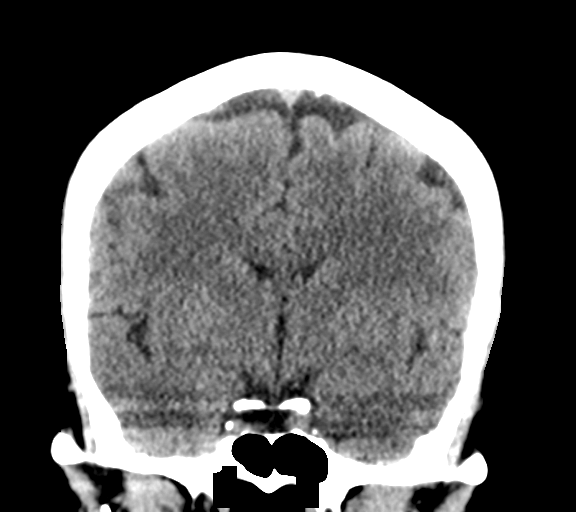
[im 38/69  brain]
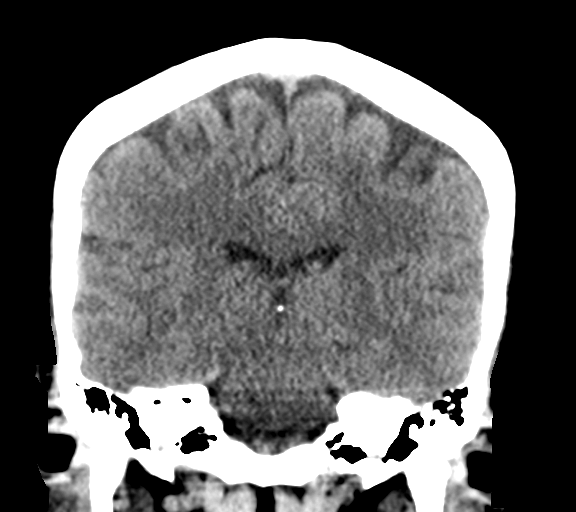

[Series 6: sagittal soft tissue · sagittal · 0.29mm/px · 3 of 51 slices shown]
[im 17/51  brain]
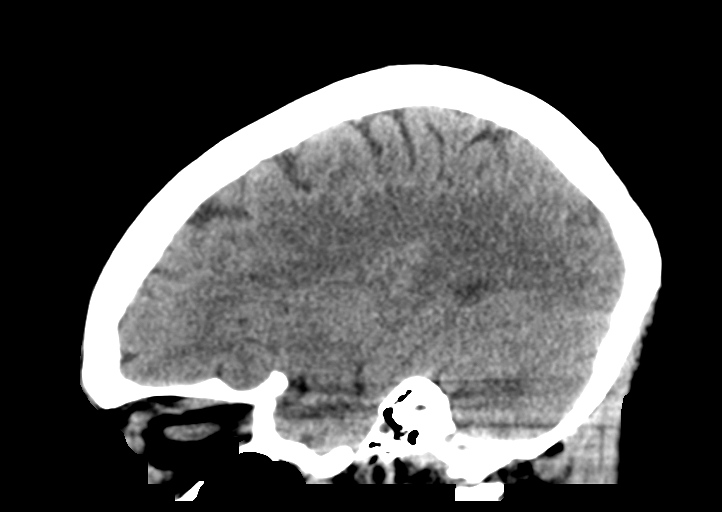
[im 26/51  brain]
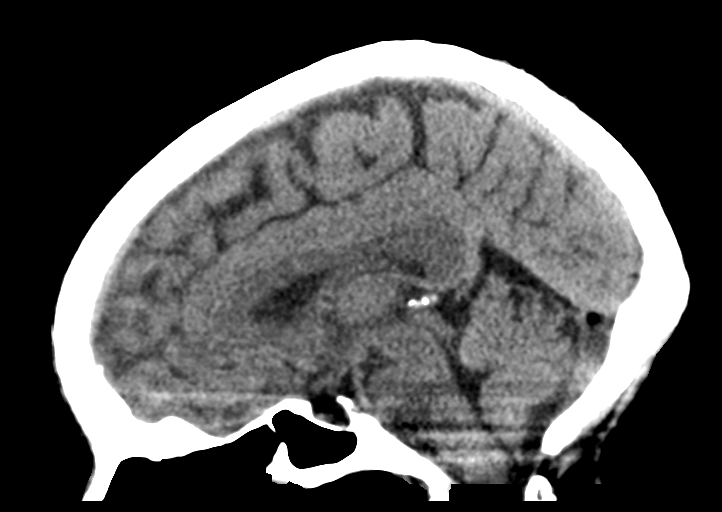
[im 34/51  brain]
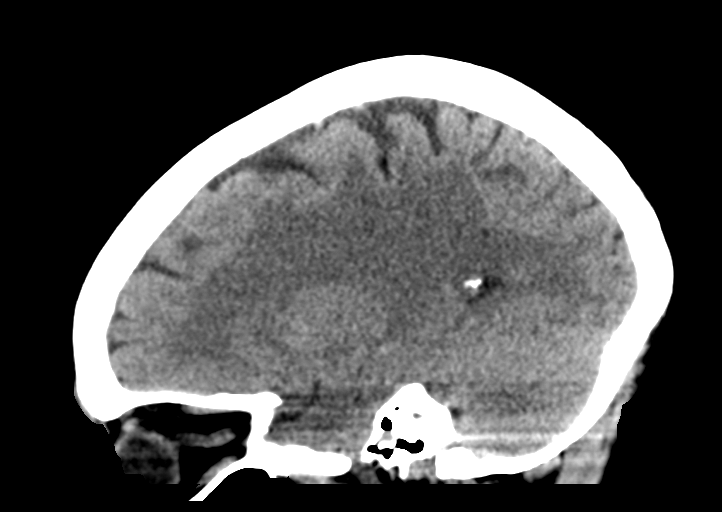

[15 of 47 positions shown; findings below may reference images not displayed]

FINDINGS: Brain: No evidence of acute infarction, hemorrhage, hydrocephalus,
extra-axial collection or mass lesion/mass effect. Calcifications in
the left basal ganglia are again noted.

Vascular: No hyperdense vessel or unexpected calcification.

Skull: Normal. Negative for fracture or focal lesion.

Sinuses/Orbits: The sinuses are essentially clear. The mastoid air
cells are clear. The left globe is unremarkable. There is a right
orbital prosthesis.

Other: None.
IMPRESSION: No acute intracranial abnormality.

## 2020-02-17 ENCOUNTER — Other Ambulatory Visit: Payer: Medicare Other

## 2020-02-17 DIAGNOSIS — G894 Chronic pain syndrome: Secondary | ICD-10-CM | POA: Diagnosis not present

## 2020-02-17 DIAGNOSIS — M5136 Other intervertebral disc degeneration, lumbar region: Secondary | ICD-10-CM | POA: Diagnosis not present

## 2020-02-17 DIAGNOSIS — Z79891 Long term (current) use of opiate analgesic: Secondary | ICD-10-CM | POA: Diagnosis not present

## 2020-03-11 DIAGNOSIS — T5891XA Toxic effect of carbon monoxide from unspecified source, accidental (unintentional), initial encounter: Secondary | ICD-10-CM | POA: Diagnosis not present

## 2020-03-11 DIAGNOSIS — G4489 Other headache syndrome: Secondary | ICD-10-CM | POA: Diagnosis not present

## 2020-03-11 DIAGNOSIS — Z743 Need for continuous supervision: Secondary | ICD-10-CM | POA: Diagnosis not present

## 2020-03-11 DIAGNOSIS — Z20822 Contact with and (suspected) exposure to covid-19: Secondary | ICD-10-CM | POA: Diagnosis not present

## 2020-03-11 DIAGNOSIS — R519 Headache, unspecified: Secondary | ICD-10-CM | POA: Diagnosis not present

## 2020-03-16 DIAGNOSIS — Z79891 Long term (current) use of opiate analgesic: Secondary | ICD-10-CM | POA: Diagnosis not present

## 2020-03-16 DIAGNOSIS — G894 Chronic pain syndrome: Secondary | ICD-10-CM | POA: Diagnosis not present

## 2020-03-16 DIAGNOSIS — M5136 Other intervertebral disc degeneration, lumbar region: Secondary | ICD-10-CM | POA: Diagnosis not present

## 2020-03-16 DIAGNOSIS — M47816 Spondylosis without myelopathy or radiculopathy, lumbar region: Secondary | ICD-10-CM | POA: Diagnosis not present

## 2020-04-22 ENCOUNTER — Ambulatory Visit: Payer: Medicare Other | Admitting: Internal Medicine

## 2020-04-22 DIAGNOSIS — Z0289 Encounter for other administrative examinations: Secondary | ICD-10-CM

## 2020-04-22 NOTE — Progress Notes (Deleted)
Name: Shari [name]  MRN/ DOB: [id number], 1955/12/13    Age/ Sex: 64 y.o., female     PCP: Philemon Kingdom, MD   Reason for Endocrinology Evaluation: Hyperthyroidism     Initial Endocrinology Clinic Visit: 06/05/2019    PATIENT IDENTIFIER: Shari [name] is a 64 y.o., female with a past medical history of T2DM. She has followed with [facility] since 06/05/2019 for consultative assistance with management of her hyperthyroidism.   HISTORICAL SUMMARY: The patient was first diagnosed with hyperthyroidism after presenting to her PCP with a 50 lb weight loss in 04/2019. Her TSH was undetectable at < 0.006 uIU/mL with elevated FT4 3.08 ng/dL and T3 829 ng/dL . She was started on Methimazole at the time    SUBJECTIVE:   During last visit (12/23/2019): Decreased  methimazole dose  Today (04/22/2020):  Shari [name] is here for a follow up on hyperthyroidism. [name] currently on methimazole 1x daily.  Weight has increased. 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   No neck enlargement     ROS:  As per HPI.   HISTORY:  Past Medical History:  Past Medical History:  Diagnosis Date  . Eye globe prosthesis   . GERD (gastroesophageal reflux disease)   . History of blood transfusion 1973   "related to abscess burst in my stomach"  . Hyperlipemia   . Hyperlipidemia   . Hypertension   . Osteoarthritis of back    Lowerback   . SVD (spontaneous vaginal delivery)    x 1, baby died at 2 wks of age  . Type II diabetes mellitus (HCC)    Past Surgical History:  Past Surgical History:  Procedure Laterality Date  . APPENDECTOMY    . COLONOSCOPY    . DILATATION & CURETTAGE/HYSTEROSCOPY WITH MYOSURE N/A 02/25/2015   Procedure: DILATATION & CURETTAGE/HYSTEROSCOPY WITH MYOSURE;  Surgeon: [name], MD;  Location: [facility];  Service: Gynecology;  Laterality: N/A;  . DILATION  AND CURETTAGE OF UTERUS    . ENUCLEATION Right 03/16/2017  . ENUCLEATION Right 03/16/2017   Procedure: ENUCLEATION RIGHT EYE;  Surgeon: [name], MD;  Location: [facility] OR;  Service: Ophthalmology;  Laterality: Right;  . 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 EYE SURGERY Right ~ 1974   "S/P initial eye injury; scissors stuck in my eye""  . LAPAROSCOPIC CHOLECYSTECTOMY    . SHOULDER ARTHROSCOPY WITH ROTATOR CUFF REPAIR Left    wire stitches per patient  . SHOULDER ARTHROSCOPY WITH ROTATOR CUFF REPAIR Right     Social History:  reports that she quit smoking about 5 years ago. Her smoking use included cigarettes. She has a 20.00 pack-year smoking history. She has never used smokeless tobacco. She reports current alcohol use. She reports that she does not use drugs. Family History:  Family History  Problem Relation Age of Onset  . Diabetes Mother   . CAD Mother   . Lung cancer Father      HOME MEDICATIONS: Allergies as of 04/22/2020      Reactions   Aspirin Hives, Itching   Chocolate Hives, Itching   Cocoa Itching, Rash   Penicillins Hives, Itching   Has patient had a PCN reaction causing IMMEDIATE RASH, FACIAL/TONGUE/THROAT SWELLING, SOB, OR LIGHTHEADEDNESS WITH HYPOTENSION:  #  #  #  YES  #  #  #  Has patient had a PCN reaction causing severe rash involving mucus membranes or skin necrosis: No Has patient had a PCN reaction that required hospitalization No Has patient had a PCN reaction occurring within the last 10 years: No If all of the above answers are "NO", then may proceed with Cephalosporin use.   Sulfa Antibiotics Hives, Itching      Medication List       Accurate as of Apr 22, 2020  7:18 AM. If you have any questions, ask your nurse or doctor.        ARIPiprazole 10 MG tablet Commonly known as: ABILIFY Take 10 mg by mouth daily.   atenolol 25 MG tablet Commonly known as: TENORMIN Take 12.5 mg by mouth daily.   atorvastatin 40 MG  tablet Commonly known as: LIPITOR Take 1 tablet by mouth daily.   lisinopril 10 MG tablet Commonly known as: ZESTRIL Take 10 mg by mouth daily.   methimazole 10 MG tablet Commonly known as: TAPAZOLE Take 1 tablet by mouth daily.   OLANZapine 10 MG tablet Commonly known as: ZYPREXA Take 10 mg by mouth at bedtime.   oxyCODONE-acetaminophen 7.5-325 MG tablet Commonly known as: PERCOCET 1 tablet 3 (three) times daily.   pantoprazole 40 MG tablet Commonly known as: PROTONIX Take 40 mg by mouth 2 (two) times daily.   risperiDONE 0.5 MG tablet Commonly known as: RISPERDAL Take 1 tablet (0.5 mg total) by mouth 2 (two) times daily.   traZODone 50 MG tablet Commonly known as: DESYREL Take 25-50 mg by mouth at bedtime.         OBJECTIVE:   PHYSICAL EXAM: VS: There were no vitals taken for this visit.   EXAM: General: Pt appears well and is in NAD  Neck: General: Supple without adenopathy. Thyroid: Thyroid size normal.  No goiter or nodules appreciated. No thyroid bruit.  Lungs: Clear with good BS bilat with no rales, rhonchi, or wheezes  Heart: Auscultation: RRR.  Abdomen: Normoactive bowel sounds, soft, nontender, without masses or organomegaly palpable  Extremities:  BL LE: No pretibial edema normal ROM and strength.  Mental Status: Judgment, insight: Intact Orientation: Oriented to time, place, and person Mood and affect: No depression, anxiety, or agitation     DATA REVIEWED:  Results for ELLIN, FITZGIBBONS (MRN [id number]) as of 12/24/2019 12:15  Ref. Range 12/23/2019 10:49  TSH Latest Ref Range: 0.35 - 4.50 uIU/mL 16.24 (H)  T4,Free(Direct) Latest Ref Range: 0.60 - 1.60 ng/dL 0.42 (L)    ASSESSMENT / PLAN / RECOMMENDATIONS:   1. Hyperthyroidism Secondary to Graves' Disease:   - Clincally she is euthyroid - No local neck symptoms  - Will reduce methimazole dose from 2 tabs to 1 tab daily    Medications   Decrease  methimazole 10 mg to 1 tablet  daily   Follow-up in 4 months  Labs in 8 weeks  Signed electronically by: Mack Guise, MD  [facility]  [facility] [address], [place] [address] Phone: [phone] FAX: [phone]      CC: Ernestene Kiel, MD [address] [place] [address] Phone: [phone]  Fax: [phone]   Return to Endocrinology clinic as below: Future Appointments  Date Time Provider [place]  04/22/2020 10:10 AM Onie Hayashi, Melanie Crazier, MD [facility] None

## 2020-04-24 DIAGNOSIS — M5136 Other intervertebral disc degeneration, lumbar region: Secondary | ICD-10-CM | POA: Diagnosis not present

## 2020-04-24 DIAGNOSIS — M47816 Spondylosis without myelopathy or radiculopathy, lumbar region: Secondary | ICD-10-CM | POA: Diagnosis not present

## 2020-04-24 DIAGNOSIS — G894 Chronic pain syndrome: Secondary | ICD-10-CM | POA: Diagnosis not present

## 2021-02-26 DIAGNOSIS — E7800 Pure hypercholesterolemia, unspecified: Secondary | ICD-10-CM | POA: Insufficient documentation

## 2021-02-26 DIAGNOSIS — K219 Gastro-esophageal reflux disease without esophagitis: Secondary | ICD-10-CM | POA: Insufficient documentation

## 2021-02-26 DIAGNOSIS — H02831 Dermatochalasis of right upper eyelid: Secondary | ICD-10-CM | POA: Diagnosis not present

## 2021-02-26 DIAGNOSIS — I10 Essential (primary) hypertension: Secondary | ICD-10-CM | POA: Insufficient documentation

## 2021-02-26 DIAGNOSIS — H11431 Conjunctival hyperemia, right eye: Secondary | ICD-10-CM | POA: Diagnosis not present

## 2021-02-26 DIAGNOSIS — H02834 Dermatochalasis of left upper eyelid: Secondary | ICD-10-CM | POA: Diagnosis not present

## 2021-02-26 DIAGNOSIS — Q111 Other anophthalmos: Secondary | ICD-10-CM | POA: Diagnosis not present

## 2021-03-18 DIAGNOSIS — E0500 Thyrotoxicosis with diffuse goiter without thyrotoxic crisis or storm: Secondary | ICD-10-CM | POA: Diagnosis not present

## 2021-03-18 DIAGNOSIS — H02834 Dermatochalasis of left upper eyelid: Secondary | ICD-10-CM | POA: Diagnosis not present

## 2021-03-18 DIAGNOSIS — Z7689 Persons encountering health services in other specified circumstances: Secondary | ICD-10-CM | POA: Diagnosis not present

## 2021-03-18 DIAGNOSIS — H02831 Dermatochalasis of right upper eyelid: Secondary | ICD-10-CM | POA: Diagnosis not present

## 2021-03-18 DIAGNOSIS — R6889 Other general symptoms and signs: Secondary | ICD-10-CM | POA: Diagnosis not present

## 2021-03-18 DIAGNOSIS — Q111 Other anophthalmos: Secondary | ICD-10-CM | POA: Diagnosis not present

## 2021-03-18 DIAGNOSIS — H11431 Conjunctival hyperemia, right eye: Secondary | ICD-10-CM | POA: Diagnosis not present

## 2021-03-19 ENCOUNTER — Other Ambulatory Visit: Payer: Self-pay | Admitting: Physician Assistant

## 2021-03-19 DIAGNOSIS — E0500 Thyrotoxicosis with diffuse goiter without thyrotoxic crisis or storm: Secondary | ICD-10-CM

## 2021-03-27 ENCOUNTER — Ambulatory Visit
Admission: RE | Admit: 2021-03-27 | Discharge: 2021-03-27 | Disposition: A | Discharge status: Home / Self Care | Payer: Medicare Other | Source: Ambulatory Visit | Attending: Physician Assistant | Admitting: Physician Assistant

## 2021-03-27 ENCOUNTER — Other Ambulatory Visit: Payer: Self-pay

## 2021-03-27 DIAGNOSIS — Z9889 Other specified postprocedural states: Secondary | ICD-10-CM | POA: Diagnosis not present

## 2021-03-27 DIAGNOSIS — E0590 Thyrotoxicosis, unspecified without thyrotoxic crisis or storm: Secondary | ICD-10-CM | POA: Diagnosis not present

## 2021-03-27 DIAGNOSIS — E0500 Thyrotoxicosis with diffuse goiter without thyrotoxic crisis or storm: Secondary | ICD-10-CM

## 2021-04-07 DIAGNOSIS — E1169 Type 2 diabetes mellitus with other specified complication: Secondary | ICD-10-CM | POA: Diagnosis not present

## 2021-04-07 DIAGNOSIS — E785 Hyperlipidemia, unspecified: Secondary | ICD-10-CM | POA: Diagnosis not present

## 2021-04-07 DIAGNOSIS — Z79899 Other long term (current) drug therapy: Secondary | ICD-10-CM | POA: Diagnosis not present

## 2021-04-07 DIAGNOSIS — Z6824 Body mass index (BMI) 24.0-24.9, adult: Secondary | ICD-10-CM | POA: Diagnosis not present

## 2021-04-07 DIAGNOSIS — I700 Atherosclerosis of aorta: Secondary | ICD-10-CM | POA: Diagnosis not present

## 2021-04-07 DIAGNOSIS — I10 Essential (primary) hypertension: Secondary | ICD-10-CM | POA: Diagnosis not present

## 2021-04-07 DIAGNOSIS — E0590 Thyrotoxicosis, unspecified without thyrotoxic crisis or storm: Secondary | ICD-10-CM | POA: Diagnosis not present

## 2021-04-20 DIAGNOSIS — Q111 Other anophthalmos: Secondary | ICD-10-CM | POA: Diagnosis not present

## 2021-07-16 ENCOUNTER — Other Ambulatory Visit: Payer: Self-pay

## 2021-07-16 ENCOUNTER — Encounter (HOSPITAL_COMMUNITY): Payer: Self-pay | Admitting: Emergency Medicine

## 2021-07-16 ENCOUNTER — Emergency Department (HOSPITAL_COMMUNITY)
Admission: EM | Admit: 2021-07-16 | Discharge: 2021-07-18 | Disposition: A | Discharge status: Home / Self Care | Payer: Medicare Other | Attending: Student | Admitting: Student

## 2021-07-16 DIAGNOSIS — F419 Anxiety disorder, unspecified: Secondary | ICD-10-CM | POA: Diagnosis not present

## 2021-07-16 DIAGNOSIS — Z20822 Contact with and (suspected) exposure to covid-19: Secondary | ICD-10-CM | POA: Diagnosis not present

## 2021-07-16 DIAGNOSIS — Z79899 Other long term (current) drug therapy: Secondary | ICD-10-CM | POA: Insufficient documentation

## 2021-07-16 DIAGNOSIS — R45850 Homicidal ideations: Secondary | ICD-10-CM | POA: Insufficient documentation

## 2021-07-16 DIAGNOSIS — E119 Type 2 diabetes mellitus without complications: Secondary | ICD-10-CM | POA: Insufficient documentation

## 2021-07-16 DIAGNOSIS — F333 Major depressive disorder, recurrent, severe with psychotic symptoms: Secondary | ICD-10-CM | POA: Diagnosis not present

## 2021-07-16 DIAGNOSIS — Z046 Encounter for general psychiatric examination, requested by authority: Secondary | ICD-10-CM | POA: Diagnosis present

## 2021-07-16 DIAGNOSIS — R451 Restlessness and agitation: Secondary | ICD-10-CM | POA: Insufficient documentation

## 2021-07-16 DIAGNOSIS — I10 Essential (primary) hypertension: Secondary | ICD-10-CM | POA: Diagnosis not present

## 2021-07-16 DIAGNOSIS — Z87891 Personal history of nicotine dependence: Secondary | ICD-10-CM | POA: Diagnosis not present

## 2021-07-16 LAB — URINALYSIS, ROUTINE W REFLEX MICROSCOPIC
Bacteria, UA: NONE SEEN
Bilirubin Urine: NEGATIVE
Glucose, UA: NEGATIVE mg/dL
Hgb urine dipstick: NEGATIVE
Ketones, ur: 5 mg/dL — AB
Nitrite: NEGATIVE
Protein, ur: 100 mg/dL — AB
Specific Gravity, Urine: 1.029 (ref 1.005–1.030)
pH: 5 (ref 5.0–8.0)

## 2021-07-16 LAB — CBC WITH DIFFERENTIAL/PLATELET
Abs Immature Granulocytes: 0.03 10*3/uL (ref 0.00–0.07)
Basophils Absolute: 0.1 10*3/uL (ref 0.0–0.1)
Basophils Relative: 1 %
Eosinophils Absolute: 0.1 10*3/uL (ref 0.0–0.5)
Eosinophils Relative: 1 %
HCT: 41.7 % (ref 36.0–46.0)
Hemoglobin: 13.7 g/dL (ref 12.0–15.0)
Immature Granulocytes: 0 %
Lymphocytes Relative: 24 %
Lymphs Abs: 2.2 10*3/uL (ref 0.7–4.0)
MCH: 27.8 pg (ref 26.0–34.0)
MCHC: 32.9 g/dL (ref 30.0–36.0)
MCV: 84.8 fL (ref 80.0–100.0)
Monocytes Absolute: 0.6 10*3/uL (ref 0.1–1.0)
Monocytes Relative: 7 %
Neutro Abs: 6.2 10*3/uL (ref 1.7–7.7)
Neutrophils Relative %: 67 %
Platelets: 375 10*3/uL (ref 150–400)
RBC: 4.92 MIL/uL (ref 3.87–5.11)
RDW: 14.3 % (ref 11.5–15.5)
WBC: 9.2 10*3/uL (ref 4.0–10.5)
nRBC: 0 % (ref 0.0–0.2)

## 2021-07-16 LAB — RAPID URINE DRUG SCREEN, HOSP PERFORMED
Amphetamines: NOT DETECTED
Barbiturates: NOT DETECTED
Benzodiazepines: NOT DETECTED
Cocaine: NOT DETECTED
Opiates: NOT DETECTED
Tetrahydrocannabinol: NOT DETECTED

## 2021-07-16 LAB — RESP PANEL BY RT-PCR (FLU A&B, COVID) ARPGX2
Influenza A by PCR: NEGATIVE
Influenza B by PCR: NEGATIVE
SARS Coronavirus 2 by RT PCR: NEGATIVE

## 2021-07-16 LAB — ACETAMINOPHEN LEVEL: Acetaminophen (Tylenol), Serum: 11 ug/mL (ref 10–30)

## 2021-07-16 LAB — COMPREHENSIVE METABOLIC PANEL
ALT: 34 U/L (ref 0–44)
AST: 31 U/L (ref 15–41)
Albumin: 4.8 g/dL (ref 3.5–5.0)
Alkaline Phosphatase: 135 U/L — ABNORMAL HIGH (ref 38–126)
Anion gap: 12 (ref 5–15)
BUN: 19 mg/dL (ref 8–23)
CO2: 24 mmol/L (ref 22–32)
Calcium: 10.6 mg/dL — ABNORMAL HIGH (ref 8.9–10.3)
Chloride: 103 mmol/L (ref 98–111)
Creatinine, Ser: 0.91 mg/dL (ref 0.44–1.00)
GFR, Estimated: >60 mL/min (ref >60)
Glucose, Bld: 116 mg/dL — ABNORMAL HIGH (ref 70–99)
Potassium: 3.1 mmol/L — ABNORMAL LOW (ref 3.5–5.1)
Sodium: 139 mmol/L (ref 135–145)
Total Bilirubin: 1.2 mg/dL (ref 0.3–1.2)
Total Protein: 8.4 g/dL — ABNORMAL HIGH (ref 6.5–8.1)

## 2021-07-16 LAB — TROPONIN I (HIGH SENSITIVITY): Troponin I (High Sensitivity): 11 ng/L (ref <18)

## 2021-07-16 LAB — ETHANOL: Alcohol, Ethyl (B): <10 mg/dL (ref <10)

## 2021-07-16 LAB — SALICYLATE LEVEL: Salicylate Lvl: <7 mg/dL — ABNORMAL LOW (ref 7.0–30.0)

## 2021-07-16 LAB — TSH: TSH: 0.304 u[IU]/mL — ABNORMAL LOW (ref 0.350–4.500)

## 2021-07-16 NOTE — ED Notes (Signed)
Patient wanded by security. 

## 2021-07-16 NOTE — ED Notes (Signed)
Called lab to add on troponin  
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
## 2021-07-16 NOTE — ED Notes (Signed)
Patient has ONE belongings bag at the nurse station across from room 4. Patient changed out into burgandy scrubs.

## 2021-07-16 NOTE — ED Triage Notes (Signed)
Patient here with sister reporting "I've been going through a lot and need a little help". Reports anxiety and agitation. Patient will not elaborate.

## 2021-07-17 MED ORDER — OLANZAPINE 5 MG PO TABS
5.0000 mg | ORAL_TABLET | Freq: Every day | ORAL | Status: DC
  Administered 2021-07-17 – 2021-07-18 (×2): 5 mg via ORAL
  Filled 2021-07-17 (×2): qty 1

## 2021-07-17 MED ORDER — LISINOPRIL 10 MG PO TABS
10.0000 mg | ORAL_TABLET | Freq: Every day | ORAL | Status: DC
  Administered 2021-07-17 – 2021-07-18 (×2): 10 mg via ORAL
  Filled 2021-07-17 (×2): qty 1

## 2021-07-17 MED ORDER — ATORVASTATIN CALCIUM 40 MG PO TABS
40.0000 mg | ORAL_TABLET | Freq: Every day | ORAL | Status: DC
  Administered 2021-07-17 – 2021-07-18 (×2): 40 mg via ORAL
  Filled 2021-07-17 (×2): qty 1

## 2021-07-17 MED ORDER — ATENOLOL 25 MG PO TABS
12.5000 mg | ORAL_TABLET | Freq: Every day | ORAL | Status: DC
  Administered 2021-07-17 – 2021-07-18 (×2): 12.5 mg via ORAL
  Filled 2021-07-17 (×2): qty 0.5

## 2021-07-17 MED ORDER — METHIMAZOLE 10 MG PO TABS
10.0000 mg | ORAL_TABLET | Freq: Every day | ORAL | Status: DC
  Administered 2021-07-17 – 2021-07-18 (×2): 10 mg via ORAL
  Filled 2021-07-17 (×2): qty 1

## 2021-07-17 NOTE — ED Notes (Signed)
BREAKFAST TRAY GIVEN 

## 2021-07-17 NOTE — ED Notes (Signed)
Patient is sitting on the bedside- she was refusing to take her medication but finally did after her brother encouraged her.  The patient thinks that people are trying to kill her

## 2021-07-17 NOTE — ED Notes (Signed)
Hot tea and crackers provided-

## 2021-07-17 NOTE — Progress Notes (Signed)
Per Teodora Medici, patient meets criteria for inpatient treatment. There are no available or appropriate beds at [facility] today. CSW faxed referrals to the following facilities for review:  [facility]  Pending - No Request Sent N/A [address] [place] [address] [phone] --  [facility]  Pending - No Request Sent N/A [address], [place] [address] [phone] --  [facility]  Pending - No Request Sent N/A [address] [place] [address] [phone] --  [facility]  Pending - No Request Sent N/A [address] [place] [address] [phone] --  [facility]  Pending - No Request Sent N/A [address] [place], [place] [address] [phone] --  [facility]  Pending - No Request Sent N/A [address] [place] [address] [phone] --  [facility]  Pending - No Request Sent N/A [address] [place] [address] [phone] --  [facility]  Pending - No Request Sent N/A [address] [place], [place] [address] [phone] --  [facility]  Pending - No Request Sent N/A [address] [place] [address] [phone] --  [facility]  Pending - No Request Sent N/A [address], [place] [address] [phone] --  [facility]  Pending - No Request Sent N/A [address], [place] [address] [phone] --   TTS will continue to seek bed placement.  Shari [name], MSW, LCSW-A, LCAS-A Phone: [phone] Disposition/TOC

## 2021-07-17 NOTE — ED Notes (Signed)
From Venda Rodes:  TTS completed. Cecilio Asper, NP recommends inpatient psychiatric treatment. [facility] is currently at capacity and other facilities will be contacted for placement.

## 2021-07-17 NOTE — ED Notes (Signed)
Lunch tray given, pt threw it away.

## 2021-07-17 NOTE — ED Notes (Signed)
The patient's sister is sitting at the bedside.  Updated on the room availability for inpatient is full at this time.  Hot  Tea provided to the patient

## 2021-07-17 NOTE — BH Assessment (Signed)
Comprehensive Clinical Assessment (CCA) Note  07/17/2021 KHANDI KERNES [id number]  DISPOSITION: Gave clinical report to Cecilio Asper, NP who determined Pt meets criteria for inpatient psychiatric treatment. Binnie Rail, [facility], confirmed adult unit is at capacity. Notified Alveria Apley, NP and Felipa Furnace, RN of recommendation via secure message.  The patient demonstrates the following risk factors for suicide: Chronic risk factors for suicide include: psychiatric disorder of major depressive disorder . Acute risk factors for suicide include: loss (financial, interpersonal, professional). Protective factors for this patient include: positive social support, responsibility to others (children, family), hope for the future, and religious beliefs against suicide. Considering these factors, the overall suicide risk at this point appears to be low. Patient is not appropriate for outpatient follow up due to psychotic symptoms.  Flowsheet Row ED from 07/16/2021 in [facility]  C-SSRS RISK CATEGORY No Risk      Pt is a 65 year old widowed female who presents to [facility] ED accompanied by her sister, [name] [phone], who participated in assessment with Pt's consent. Pt reports for the past month she has been "hearing people talking over wifi." She says there are listening devices everywhere and "I am being watched 24/7." She says this makes her angry. Pt acknowledges symptoms including crying spells, social withdrawal, loss of interest in usual pleasures, fatigue, irritability, decreased concentration, decreased sleep, decreased appetite and feelings of guilt, worthlessness and hopelessness. She says she hs trouble sleeping because of voices talking. She says she has lost 10-12 pounds in the past month because she does not want to eat. She says she has experienced these symptoms before, most recently in 2020. She denies current suicidal ideation or  history of suicide attempts. She denies current homicidal ideation or history of aggression. She denies alcohol or other substance use.  Pt sister says Pt has an apartment but refuses to stay there because she believes people are going to break in an kill her. Her sister says Pt believes there are people inside the apartment and outside. Pt was staying with a friend but left while the friend was asleep and law enforcement had to be called because Pt wanders in fear. Her sister says "we have to watch her all the time because she is so paranoid and runs off." Pt's sister says Pt talks to the voices, and Pt agrees saying the voices on wifi are real. Pt's sister also reports Pt said she saw a man that was not there. Pt's sister reports Pt believe young relatives are being sexually abused by strangers and keeps talking about how the children are in danger.  Pt says she has no mental health providers and takes no psychiatric medications. Pt's medical record indicates she was psychiatrically hospitalized at [facility] in November 2020 for psychotic symptoms.  Pt is dressed in hospital scrubs, alert and oriented x4. Pt speaks in a clear tone, at moderate volume and normal pace. Motor behavior appears normal. Eye contact is good. Pt's mood is anxious and affect is congruent with mood. Thought process is coherent with delusional thought content. Pt's insight is poor and judgment is impaired. Pt says she is willing to sign voluntarily into a psychiatric facility.    Chief Complaint:  Chief Complaint  Patient presents with   Mental Health Problem   Visit Diagnosis: F33.3 Major depressive disorder, Recurrent episode, With psychotic features   CCA Screening, Triage and Referral (STR)  Patient Reported Information How did you  hear about [place]? Family/Friend  Referral name: No data recorded Referral phone number: No data recorded  Whom do you see for routine medical problems? No data  recorded Practice/Facility Name: No data recorded Practice/Facility Phone Number: No data recorded Name of Contact: No data recorded Contact Number: No data recorded Contact Fax Number: No data recorded Prescriber Name: No data recorded Prescriber Address (if known): No data recorded  What Is the Reason for Your Visit/Call Today? Pt describes auditory hallucinations, paranoid delusions, and anxiety.  How Long Has This Been Causing You Problems? 1 wk - 1 month  What Do You Feel Would Help You the Most Today? Treatment for Depression or other mood problem; Medication(s)   Have You Recently Been in Any Inpatient Treatment (Hospital/Detox/Crisis Center/28-Day Program)? No data recorded Name/Location of Program/Hospital:No data recorded How Long Were You There? No data recorded When Were You Discharged? No data recorded  Have You Ever Received Services From [facility] Before? No data recorded Who Do You See at [facility]? No data recorded  Have You Recently Had Any Thoughts About Hurting Yourself? No  Are You Planning to Commit Suicide/Harm Yourself At This time? No   Have you Recently Had Thoughts About Hurting Someone [name]? No  Explanation: No data recorded  Have You Used Any Alcohol or Drugs in the Past 24 Hours? No  How Long Ago Did You Use Drugs or Alcohol? No data recorded What Did You Use and How Much? No data recorded  Do You Currently Have a Therapist/Psychiatrist? No  Name of Therapist/Psychiatrist: No data recorded  Have You Been Recently Discharged From Any Office Practice or Programs? No  Explanation of Discharge From Practice/Program: No data recorded    CCA Screening Triage Referral Assessment Type of Contact: Face-to-Face  Is this Initial or Reassessment? No data recorded Date Telepsych consult ordered in [facility]:  No data recorded Time Telepsych consult ordered in [facility]:  No data recorded  Patient Reported Information Reviewed? No data recorded Patient  Left Without Being Seen? No data recorded Reason for Not Completing Assessment: No data recorded  Collateral Involvement: Pt's sister: [name]   Does Patient Have a Automotive engineer Guardian? No data recorded Name and Contact of Legal Guardian: No data recorded If Minor and Not Living with Parent(s), Who has Custody? NA  Is CPS involved or ever been involved? Never  Is APS involved or ever been involved? Never   Patient Determined To Be At Risk for Harm To Self or Others Based on Review of Patient Reported Information or Presenting Complaint? No  Method: No data recorded Availability of Means: No data recorded Intent: No data recorded Notification Required: No data recorded Additional Information for Danger to Others Potential: No data recorded Additional Comments for Danger to Others Potential: No data recorded Are There Guns or Other Weapons in Your Home? No data recorded Types of Guns/Weapons: No data recorded Are These Weapons Safely Secured?                            No data recorded Who Could Verify You Are Able To Have These Secured: No data recorded Do You Have any Outstanding Charges, Pending Court Dates, Parole/Probation? No data recorded Contacted To Inform of Risk of Harm To Self or Others: Family/Significant Other:   Location of Assessment: [facility] ED   Does Patient Present under Involuntary Commitment? No  IVC Papers Initial File Date: No data recorded  [place]   Patient Currently Receiving the Following Services: Not Receiving Services   Determination of Need: Urgent (48 hours)   Options For Referral: Inpatient Hospitalization     CCA Biopsychosocial Intake/Chief Complaint:  No data recorded Current Symptoms/Problems: No data recorded  Patient Reported Schizophrenia/Schizoaffective Diagnosis in Past: No   Strengths: Pt has good family support  Preferences: No data recorded Abilities: No data recorded  Type  of Services Patient Feels are Needed: No data recorded  Initial Clinical Notes/Concerns: No data recorded  Mental Health Symptoms Depression:   Change in energy/activity; Difficulty Concentrating; Fatigue; Hopelessness; Increase/decrease in appetite; Sleep (too much or little); Tearfulness; Weight gain/loss; Worthlessness   Duration of Depressive symptoms:  Greater than two weeks   Mania:   None   Anxiety:    Difficulty concentrating; Fatigue; Restlessness; Tension; Worrying   Psychosis:   Delusions; Hallucinations   Duration of Psychotic symptoms:  Less than six months   Trauma:   None   Obsessions:   None   Compulsions:   None   Inattention:   N/A   Hyperactivity/Impulsivity:   N/A   Oppositional/Defiant Behaviors:   N/A   Emotional Irregularity:   None   Other Mood/Personality Symptoms:   NA    Mental Status Exam Appearance and self-care  Stature:   Average   Weight:   Average weight   Clothing:   -- (scrubs)   Grooming:   Neglected   Cosmetic use:   None   Posture/gait:   Normal   Motor activity:   Not Remarkable   Sensorium  Attention:   Normal   Concentration:   Anxiety interferes   Orientation:   X5   Recall/memory:   Normal   Affect and Mood  Affect:   Anxious   Mood:   Anxious   Relating  Eye contact:   Normal   Facial expression:   Anxious   Attitude toward examiner:   Cooperative   Thought and Language  Speech flow:  Clear and Coherent   Thought content:   Delusions   Preoccupation:   Ruminations   Hallucinations:   Auditory; Visual   Organization:  No data recorded  Affiliated Computer ServicesExecutive Functions  Fund of Knowledge:   Average   Intelligence:   Average   Abstraction:   Normal   Judgement:   Impaired   Reality Testing:   Distorted   Insight:   Poor   Decision Making:   Vacilates   Social Functioning  Social Maturity:   Isolates   Social Judgement:   Normal   Stress   Stressors:   Grief/losses   Coping Ability:   Human resources officerverwhelmed   Skill Deficits:   None   Supports:   Family; Friends/Service system     Religion: Religion/Spirituality Are You A Religious Person?: Yes What is Your Religious Affiliation?: Christian How Might This Affect Treatment?: NA  Leisure/Recreation: Leisure / Recreation Do You Have Hobbies?: Yes Leisure and Hobbies: Musicianlaying solitare, visiting family  Exercise/Diet: Exercise/Diet Do You Exercise?: No Have You Gained or Lost A Significant Amount of Weight in the Past Six Months?: Yes-Lost Number of Pounds Lost?: 10 Do You Follow a Special Diet?: No Do You Have Any Trouble Sleeping?: Yes Explanation of Sleeping Difficulties: Pt reports difficulty sleeping due to auditory hallucinations.   CCA Employment/Education Employment/Work Situation: Employment / Work Situation Employment Situation: On disability Why is Patient on Disability: Mental health How Long has Patient Been on Disability: 2015 Patient's Job has Been  Impacted by Current Illness: No Has Patient ever Been in the Military?: No  Education: Education Is Patient Currently Attending School?: No Did You Attend College?: Yes What Type of College Degree Do you Have?: Some college Did You Have An Individualized Education Program (IIEP): No Did You Have Any Difficulty At School?: No Patient's Education Has Been Impacted by Current Illness: No   CCA Family/Childhood History Family and Relationship History: Family history Marital status: Widowed Widowed, when?: 34 years ago Does patient have children?: No  Childhood History:  Childhood History By whom was/is the patient raised?: Both parents Did patient suffer any verbal/emotional/physical/sexual abuse as a child?: No Did patient suffer from severe childhood neglect?: No Has patient ever been sexually abused/assaulted/raped as an adolescent or adult?: No Was the patient ever a victim of a crime or a  disaster?: No Witnessed domestic violence?: No Has patient been affected by domestic violence as an adult?: No  Child/Adolescent Assessment:     CCA Substance Use Alcohol/Drug Use: Alcohol / Drug Use Pain Medications: see MAR Prescriptions: see MAR Over the Counter: see MAR History of alcohol / drug use?: No history of alcohol / drug abuse                         ASAM's:  Six Dimensions of Multidimensional Assessment  Dimension 1:  Acute Intoxication and/or Withdrawal Potential:      Dimension 2:  Biomedical Conditions and Complications:      Dimension 3:  Emotional, Behavioral, or Cognitive Conditions and Complications:     Dimension 4:  Readiness to Change:     Dimension 5:  Relapse, Continued use, or Continued Problem Potential:     Dimension 6:  Recovery/Living Environment:     ASAM Severity Score:    ASAM Recommended Level of Treatment:     Substance use Disorder (SUD)    Recommendations for Services/Supports/Treatments:    DSM5 Diagnoses: Patient Active Problem List   Diagnosis Date Noted   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   Graves disease 09/20/2019   Palpitations 05/29/2019   Hyperthyroidism 05/29/2019   Mixed dyslipidemia 05/29/2019   Paroxysmal atrial fibrillation (HCC) 05/29/2019   PMB (postmenopausal bleeding) 03/21/2018   Urinary retention 03/21/2018   Uterine enlargement 03/21/2018   Surgery, elective 03/16/2017    Patient Centered Plan: Patient is on the following Treatment Plan(s):  Anxiety and Depression   Referrals to Alternative Service(s): Referred to Alternative Service(s):   Place:   Date:   Time:    Referred to Alternative Service(s):   Place:   Date:   Time:    Referred to Alternative Service(s):   Place:   Date:   Time:    Referred to Alternative Service(s):   Place:   Date:   Time:     Pamalee Leyden, [facility]

## 2021-07-17 NOTE — ED Notes (Signed)
Breakfast served

## 2021-07-18 NOTE — ED Notes (Signed)
Pt refused vitals 

## 2021-07-18 NOTE — ED Notes (Signed)
RN attempted to get vitals on patient. Patient said "no, this is a whore house you are not touching me."

## 2021-07-18 NOTE — Progress Notes (Signed)
Per Teodora Medici, patient meets criteria for inpatient treatment. There are no available or appropriate beds at [facility] today. CSW re-faxed referrals to the following facilities for review:   [facility]  Pending - No Request Sent N/A [address], [place] [address] [phone] --  [facility]  Pending - No Request Sent N/A [address], [place] [address] [phone] --  [facility]  Pending - No Request Sent N/A [address] [place] [address] [phone] --  [facility]  Pending - No Request Sent N/A [address], [place] [address] [phone] --  [facility]  Pending - No Request Sent N/A [address] [place], [place] [address] [phone] --  [facility]  Pending - No Request Sent N/A [address] [place] [address] [phone] --  [facility]  Pending - No Request Sent N/A [address] [place] [address] [phone] --  [facility]  Pending - No Request Sent N/A [address] [place], [place] [address] [phone] --  [facility]  Pending - No Request Sent N/A [address], [place] [address] [phone] --  [facility]  Pending - No Request Sent N/A [address] [place] [address] [phone] --  [facility]  Pending - No Request Sent N/A [address] [place] [address] [phone] --    TTS will continue to seek bed placement.   Crissie Reese, MSW, LCSW-A, LCAS-A Phone: [phone] Disposition/TOC

## 2021-07-18 NOTE — ED Provider Notes (Signed)
Emergency Medicine Observation Re-evaluation Note  Shari [name] is a 65 y.o. female, seen on rounds today.  Pt initially presented to the ED for complaints of Mental Health Problem On arrival she was with her sister, requesting help, and reporting anxiety and agitation.  Patient has continued to be a difficult historian. Currently, the patient is resting on her stretcher.  She has a prior history of "psychotic disorder."  Patient has expressed already hallucinations and delusions.  He denies being suicidal.  [name] requesting to be started on her usual medications.  These have been ordered.  Physical Exam  BP 131/82   Pulse 94   Temp 98.7 F (37.1 C) (Oral)   Resp 16   SpO2 100%  Physical Exam General: [name] alert, restless, pacing.  [name] redirectable. Cardiac: Normal heart rate Lungs: Normal respiratory Psych: Delusional, "those men over they are going to get me."  ED Course / MDM  EKG:EKG Interpretation  Date/Time:  Friday July 16 2021 20:13:23 EDT Ventricular Rate:  98 PR Interval:  88 QRS Duration: 82 QT Interval:  356 QTC Calculation: 454 R Axis:   62 Text Interpretation: Sinus rhythm with short PR Minimal voltage criteria for LVH, may be normal variant ( Sokolow-Lyon ) ST & T wave abnormality, consider inferior ischemia ST & T wave abnormality, consider anterolateral ischemia Abnormal ECG TWI new since previous Confirmed by Richardean Canal [phone]) on 07/16/2021 8:22:51 PM  I have reviewed the labs performed to date as well as medications administered while in observation.  Recent changes in the last 24 hours include patient unwilling to participate with vitals being obtained, and not eating..  Plan  Current plan is for psychiatry has arranged placement at an outlying psychiatric hospital.  Patient meets criteria for IVC so I will proceed with that, prior to transport.  ANNIEBELLE DEVORE is now under involuntary commitment.     Mancel Bale, MD 07/18/21 1105

## 2021-07-18 NOTE — ED Notes (Signed)
Transport requested

## 2021-07-18 NOTE — Progress Notes (Signed)
Per [place], pt has been accepted to [facility] pending the patient is under IVC'd for safety and transport. Accepting provider is Dr. Johnnye Sima. Patient can arrive anytime. Number for report is [phone]. Fax number to [phone]. [place] asked for IVC to be faxed before calling report.    Crissie Reese, MSW, LCSW-A Phone: [phone] Disposition/TOC

## 2021-08-10 ENCOUNTER — Other Ambulatory Visit: Payer: Self-pay

## 2021-08-10 ENCOUNTER — Emergency Department (HOSPITAL_COMMUNITY)
Admission: EM | Admit: 2021-08-10 | Discharge: 2021-08-11 | Disposition: A | Discharge status: Home / Self Care | Payer: Medicare Other | Attending: Emergency Medicine | Admitting: Emergency Medicine

## 2021-08-10 DIAGNOSIS — R440 Auditory hallucinations: Secondary | ICD-10-CM

## 2021-08-10 DIAGNOSIS — Z20822 Contact with and (suspected) exposure to covid-19: Secondary | ICD-10-CM | POA: Insufficient documentation

## 2021-08-10 DIAGNOSIS — Z87891 Personal history of nicotine dependence: Secondary | ICD-10-CM | POA: Diagnosis not present

## 2021-08-10 DIAGNOSIS — F22 Delusional disorders: Secondary | ICD-10-CM | POA: Insufficient documentation

## 2021-08-10 DIAGNOSIS — U071 COVID-19: Secondary | ICD-10-CM | POA: Diagnosis not present

## 2021-08-10 DIAGNOSIS — I10 Essential (primary) hypertension: Secondary | ICD-10-CM | POA: Diagnosis not present

## 2021-08-10 DIAGNOSIS — Z79899 Other long term (current) drug therapy: Secondary | ICD-10-CM | POA: Insufficient documentation

## 2021-08-10 DIAGNOSIS — E119 Type 2 diabetes mellitus without complications: Secondary | ICD-10-CM | POA: Diagnosis not present

## 2021-08-10 LAB — CBC WITH DIFFERENTIAL/PLATELET
Abs Immature Granulocytes: 0.07 10*3/uL (ref 0.00–0.07)
Basophils Absolute: 0 10*3/uL (ref 0.0–0.1)
Basophils Relative: 0 %
Eosinophils Absolute: 0 10*3/uL (ref 0.0–0.5)
Eosinophils Relative: 1 %
HCT: 34 % — ABNORMAL LOW (ref 36.0–46.0)
Hemoglobin: 10.7 g/dL — ABNORMAL LOW (ref 12.0–15.0)
Immature Granulocytes: 1 %
Lymphocytes Relative: 30 %
Lymphs Abs: 2.1 10*3/uL (ref 0.7–4.0)
MCH: 27.7 pg (ref 26.0–34.0)
MCHC: 31.5 g/dL (ref 30.0–36.0)
MCV: 88.1 fL (ref 80.0–100.0)
Monocytes Absolute: 0.5 10*3/uL (ref 0.1–1.0)
Monocytes Relative: 7 %
Neutro Abs: 4.3 10*3/uL (ref 1.7–7.7)
Neutrophils Relative %: 61 %
Platelets: 255 10*3/uL (ref 150–400)
RBC: 3.86 MIL/uL — ABNORMAL LOW (ref 3.87–5.11)
RDW: 14.3 % (ref 11.5–15.5)
WBC: 7.1 10*3/uL (ref 4.0–10.5)
nRBC: 0 % (ref 0.0–0.2)

## 2021-08-10 LAB — RAPID URINE DRUG SCREEN, HOSP PERFORMED
Amphetamines: NOT DETECTED
Barbiturates: NOT DETECTED
Benzodiazepines: NOT DETECTED
Cocaine: NOT DETECTED
Opiates: NOT DETECTED
Tetrahydrocannabinol: NOT DETECTED

## 2021-08-10 LAB — COMPREHENSIVE METABOLIC PANEL
ALT: 16 U/L (ref 0–44)
AST: 18 U/L (ref 15–41)
Albumin: 3.6 g/dL (ref 3.5–5.0)
Alkaline Phosphatase: 84 U/L (ref 38–126)
Anion gap: 9 (ref 5–15)
BUN: 16 mg/dL (ref 8–23)
CO2: 22 mmol/L (ref 22–32)
Calcium: 9.6 mg/dL (ref 8.9–10.3)
Chloride: 107 mmol/L (ref 98–111)
Creatinine, Ser: 1.05 mg/dL — ABNORMAL HIGH (ref 0.44–1.00)
GFR, Estimated: 59 mL/min — ABNORMAL LOW (ref >60)
Glucose, Bld: 129 mg/dL — ABNORMAL HIGH (ref 70–99)
Potassium: 4.5 mmol/L (ref 3.5–5.1)
Sodium: 138 mmol/L (ref 135–145)
Total Bilirubin: 0.3 mg/dL (ref 0.3–1.2)
Total Protein: 6.7 g/dL (ref 6.5–8.1)

## 2021-08-10 LAB — ETHANOL: Alcohol, Ethyl (B): <10 mg/dL (ref <10)

## 2021-08-10 NOTE — ED Triage Notes (Signed)
Pt states she was discharged from [name] today for psych problems. Pt reports hearing voices tonight. Pt denies SI/HI.

## 2021-08-10 NOTE — ED Provider Notes (Signed)
Emergency Medicine Provider Triage Evaluation Note  Shari [name] , a 65 y.o. female  was evaluated in triage.  Pt complains of paranoia and auditory hallucinations.  Reports that she was discharged from [name] earlier today.  Patient reports that she returned home and started having auditory hallucinations.  States that she hears voices stating "I am getting get you."  States that she feels like someone is going to "hurt her when she is at her house."  Patient reports that she lives alone.  Reports that she has been taking all her medications as prescribed.  Denies any illicit drug use or alcohol use.  Review of Systems  Positive: Auditory hallucinations, paranoia Negative: Suicidal ideations, homicidal ideations, visual hallucinations  Physical Exam  BP 135/77 (BP Location: Right Arm)   Pulse 79   Temp 98.3 F (36.8 C) (Oral)   Resp 16   SpO2 96%  Gen:   Awake, no distress   Resp:  Normal effort  MSK:   Moves extremities without difficulty  Other:    Medical Decision Making  Medically screening exam initiated at 9:17 PM.  Appropriate orders placed.  Shari [name] was informed that the remainder of the evaluation will be completed by another provider, this initial triage assessment does not replace that evaluation, and the importance of remaining in the ED until their evaluation is complete.  The patient appears stable so that the remainder of the work up may be completed by another provider.      Berneice Heinrich 08/10/21 2120    Maia Plan, MD 08/10/21 2352

## 2021-08-11 LAB — RESP PANEL BY RT-PCR (FLU A&B, COVID) ARPGX2
Influenza A by PCR: NEGATIVE
Influenza B by PCR: NEGATIVE
SARS Coronavirus 2 by RT PCR: POSITIVE — AB

## 2021-08-11 MED ORDER — IBUPROFEN 800 MG PO TABS
800.0000 mg | ORAL_TABLET | Freq: Three times a day (TID) | ORAL | Status: DC
  Administered 2021-08-11: 800 mg via ORAL
  Filled 2021-08-11: qty 1

## 2021-08-11 MED ORDER — LISINOPRIL 10 MG PO TABS
10.0000 mg | ORAL_TABLET | Freq: Every day | ORAL | Status: DC
  Administered 2021-08-11: 10 mg via ORAL
  Filled 2021-08-11: qty 1

## 2021-08-11 MED ORDER — ATENOLOL 25 MG PO TABS
12.5000 mg | ORAL_TABLET | Freq: Every day | ORAL | Status: DC
  Administered 2021-08-11: 12.5 mg via ORAL
  Filled 2021-08-11: qty 1

## 2021-08-11 MED ORDER — PANTOPRAZOLE SODIUM 40 MG PO TBEC: 40.0000 mg | DELAYED_RELEASE_TABLET | Freq: Two times a day (BID) | ORAL | Status: DC

## 2021-08-11 MED ORDER — METHIMAZOLE 10 MG PO TABS
10.0000 mg | ORAL_TABLET | Freq: Every day | ORAL | Status: DC
  Filled 2021-08-11: qty 1

## 2021-08-11 MED ORDER — ATORVASTATIN CALCIUM 40 MG PO TABS
40.0000 mg | ORAL_TABLET | Freq: Every day | ORAL | Status: DC
  Administered 2021-08-11: 40 mg via ORAL
  Filled 2021-08-11: qty 1

## 2021-08-11 NOTE — Discharge Instructions (Signed)
If unable to stay home alone; work with family to check into assisted living facilities.  Follow up with your primary provider for [facility] and referral.

## 2021-08-11 NOTE — ED Notes (Signed)
Pt is currently eating breakfast.

## 2021-08-11 NOTE — BH Assessment (Signed)
Comprehensive Clinical Assessment (CCA) Note  08/11/2021 Shari FellingBetty L [name] [id number]  Disposition: Per Assunta FoundShuvon Rankin, NP, patient is psychiatrically cleared and recommended for PHP.   Flowsheet Row ED [facility]  C-SSRS RISK CATEGORY No Risk No Risk      The patient demonstrates the following risk factors for suicide: Chronic risk factors for suicide include: psychiatric disorder of brief psychotic episode . Acute risk factors for suicide include: N/A. Protective factors for this patient include: positive social support. Considering these factors, the overall suicide risk at this point appears to be low. Patient is appropriate for outpatient follow up.   Shari CompBetty Busche is a 65 year old female [name] AH after [facility]. Patient reports she was discharge from inpatient treatment yesterday and transported home by sheriff. Patient reports she was in hospital for about a month before discharge. Patient reports when she got home, she heard some of her neighbors say, "she back and ready to cause problems again". Patient reports when she got into the house she had "one of my breakdowns" and reports she started hearing voices telling "if you go out the door or do this or that, I'm going to kill you". Patient reports hearing the voice of a female and she is unable to recognize the voice. Patient also reports that a girl that was dating her ex-boyfriend has been bothering her. Patient denies the girl living near her or even seeing the girl however, reports that the girl has someone else following her. Patient denies current AH and reports it only happened when she went home yesterday. Patient does report feeling paranoid.   Patient not sure what her mental health diagnosis is when she was [facility]. Patient reports a few other  inpatient hospitalizations for the same reason. Reports her sister and brother are the ones who made her go to hospital. Pt denies outpatient services. Pt denies alcohol and substance use. Patient reports sister lives in [place] and is supportive but also reports paranoid thoughts about her sister plotting against her and dating her ex-boyfriend. Patient receives disability however, reports someone stole her card, so she deactivated her debit card. Pt reports P/S about age 65 yo.   Patient is alert, engaged and calm. Patient is oriented x4, her speech is clear, and her eye contact is normal. Patient denies SI, HI, and AVH however reports AH yesterday.  Patient not actively responding to internal/external stimuli.         Patient consents for TTS to obtain collateral from patient sister Shari [name] who reports that patient has been paranoid and delusional for the past two years. Sister reports that two months ago patient was paranoid thinking that her house, phone and purse was being bugged. Sister reports that patient was hearing voices saying someone was going to kill her and other behaviors which prompted the family to take patient to the hospital to seek help. Sister reports that patient was doing well for about six months after one of her inpatient visits but eventually started having problems again. Sister reports that she did not know patient was back home and wants to support patient as best as she can. Sister feels that patient can not live alone now. Sister aware of PHP referral.    Chief Complaint:  Chief Complaint  Patient presents with   Hallucinations   Visit Diagnosis: Hallucinations  CCA Screening, Triage and Referral (STR)  Patient Reported Information How did you hear about [place]? Self  What Is the Reason for Your Visit/Call Today? Shari [name] , a 65 y.o. female  was evaluated in triage.  Pt complains of paranoia and auditory hallucinations.  Reports that she was discharged from [name]  earlier today.  Patient reports that she returned home and started having auditory hallucinations.  States that she hears voices stating "I am getting get you."  States that she feels like someone is going to "hurt her when she is at her house."  Patient reports that she lives alone.  Reports that she has been taking all her medications as prescribed.  How Long Has This Been Causing You Problems? > than 6 months  What Do You Feel Would Help You the Most Today? Treatment for Depression or other mood problem   Have You Recently Had Any Thoughts About Hurting Yourself? No  Are You Planning to Commit Suicide/Harm Yourself At This time? No   Have you Recently Had Thoughts About Hurting Someone [name]? No  Are You Planning to Harm Someone at This Time? No  Explanation: No data recorded  Have You Used Any Alcohol or Drugs in the Past 24 Hours? No  How Long Ago Did You Use Drugs or Alcohol? No data recorded What Did You Use and How Much? No data recorded  Do You Currently Have a Therapist/Psychiatrist? No  Name of Therapist/Psychiatrist: No data recorded  Have You Been Recently Discharged From Any Office Practice or Programs? Yes  Explanation of Discharge From Practice/Program: [name]     CCA Screening Triage Referral Assessment Type of Contact: Tele-Assessment  Telemedicine Service Delivery: Telemedicine service delivery: This service was provided via telemedicine using a 2-way, interactive audio and video technology  Is this Initial or Reassessment? Initial Assessment  Date Telepsych consult ordered in [facility]:  08/11/21  Time Telepsych consult ordered in [facility]:  No data recorded Location of Assessment: [facility] ED  Provider Location: [facility] Assessment Services   Collateral Involvement: Shari [name] (sister)   Does Patient Have a Automotive engineer Guardian? No data recorded Name and Contact of Legal Guardian: No data recorded If Minor and Not Living with Parent(s), Who  has Custody? NA  Is CPS involved or ever been involved? Never  Is APS involved or ever been involved? Never   Patient Determined To Be At Risk for Harm To Self or Others Based on Review of Patient Reported Information or Presenting Complaint? No  Method: No data recorded Availability of Means: No data recorded Intent: No data recorded Notification Required: No data recorded Additional Information for Danger to Others Potential: No data recorded Additional Comments for Danger to Others Potential: No data recorded Are There Guns or Other Weapons in Your Home? No data recorded Types of Guns/Weapons: No data recorded Are These Weapons Safely Secured?                            No data recorded Who Could Verify You Are Able To Have These Secured: No data recorded Do You Have any Outstanding Charges, Pending Court Dates, Parole/Probation? No data recorded Contacted To Inform of Risk of Harm To Self or Others: Family/Significant Other:    Does Patient Present under Involuntary Commitment? No  IVC Papers Initial File Date: No data recorded  [place] of Residence: [place]   Patient Currently Receiving the Following Services: Not Receiving  Services   Determination of Need: Urgent (48 hours)   Options For Referral: Partial Hospitalization; Medication Management; Outpatient Therapy     CCA Biopsychosocial Patient Reported Schizophrenia/Schizoaffective Diagnosis in Past: No   Strengths: Pt has good family support   Mental Health Symptoms Depression:   Change in energy/activity   Duration of Depressive symptoms:    Mania:   None   Anxiety:    Difficulty concentrating; Fatigue; Restlessness; Tension; Worrying   Psychosis:   Delusions; Hallucinations   Duration of Psychotic symptoms:  Duration of Psychotic Symptoms: Greater than six months   Trauma:   None   Obsessions:   None   Compulsions:   None   Inattention:   N/A   Hyperactivity/Impulsivity:   N/A    Oppositional/Defiant Behaviors:   N/A   Emotional Irregularity:   None   Other Mood/Personality Symptoms:   NA    Mental Status Exam Appearance and self-care  Stature:   Average   Weight:   Average weight   Clothing:   -- (scrubs)   Grooming:   Normal   Cosmetic use:   None   Posture/gait:   Normal   Motor activity:   Not Remarkable   Sensorium  Attention:   Normal   Concentration:   Anxiety interferes; Normal   Orientation:   X5   Recall/memory:   Defective in Short-term   Affect and Mood  Affect:   Full Range   Mood:   Euthymic   Relating  Eye contact:   Normal   Facial expression:   Responsive   Attitude toward examiner:   Cooperative   Thought and Language  Speech flow:  Clear and Coherent   Thought content:   Delusions   Preoccupation:   None   Hallucinations:   Auditory   Organization:  No data recorded  [employer] of Knowledge:   Average   Intelligence:   Average   Abstraction:   Normal   Judgement:   Fair   Dance movement psychotherapist:   Distorted   Insight:   Poor   Decision Making:   Vacilates   Social Functioning  Social Maturity:   Isolates   Social Judgement:   Normal   Stress  Stressors:   Transitions; Housing   Coping Ability:   Overwhelmed   Skill Deficits:   None   Supports:   Family; Friends/Service system     Religion: Religion/Spirituality Are You A Religious Person?: Yes What is Your Religious Affiliation?: Christian How Might This Affect Treatment?: NA  Leisure/Recreation: Leisure / Recreation Do You Have Hobbies?: Yes Leisure and Hobbies: Musician, visiting family  Exercise/Diet: Exercise/Diet Do You Exercise?: No Have You Gained or Lost A Significant Amount of Weight in the Past Six Months?: Yes-Lost Number of Pounds Lost?: 10 Do You Follow a Special Diet?: No Do You Have Any Trouble Sleeping?: No   CCA Employment/Education Employment/Work  Situation: Employment / Work Situation Employment Situation: On disability Why is Patient on Disability: Mental health How Long has Patient Been on Disability: 2015 Patient's Job has Been Impacted by Current Illness: No Has Patient ever Been in the [employer]?: No  Education: Education Did Theme park manager?: Yes What Type of College Degree Do you Have?: Some college Did You Have An Individualized Education Program (IIEP): No Did You Have Any Difficulty At School?: No   CCA Family/Childhood History Family and Relationship History: Family history Marital status: Widowed Widowed, when?: 34 years ago Does patient have children?:  No  Childhood History:  Childhood History By whom was/is the patient raised?: Both parents Did patient suffer any verbal/emotional/physical/sexual abuse as a child?: No Did patient suffer from severe childhood neglect?: No Has patient ever been sexually abused/assaulted/raped as an adolescent or adult?: No Was the patient ever a victim of a crime or a disaster?: No Witnessed domestic violence?: No Has patient been affected by domestic violence as an adult?: No  Child/Adolescent Assessment:     CCA Substance Use Alcohol/Drug Use: Alcohol / Drug Use Pain Medications: see MAR Prescriptions: see MAR Over the Counter: see MAR History of alcohol / drug use?: No history of alcohol / drug abuse                         ASAM's:  Six Dimensions of Multidimensional Assessment  Dimension 1:  Acute Intoxication and/or Withdrawal Potential:      Dimension 2:  Biomedical Conditions and Complications:      Dimension 3:  Emotional, Behavioral, or Cognitive Conditions and Complications:     Dimension 4:  Readiness to Change:     Dimension 5:  Relapse, Continued use, or Continued Problem Potential:     Dimension 6:  Recovery/Living Environment:     ASAM Severity Score:    ASAM Recommended Level of Treatment:     Substance use Disorder (SUD)     Recommendations for Services/Supports/Treatments:    Discharge Disposition:    DSM5 Diagnoses: Patient Active Problem List   Diagnosis Date Noted   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   Graves disease 09/20/2019   Palpitations 05/29/2019   Hyperthyroidism 05/29/2019   Mixed dyslipidemia 05/29/2019   Paroxysmal atrial fibrillation (HCC) 05/29/2019   PMB (postmenopausal bleeding) 03/21/2018   Urinary retention 03/21/2018   Uterine enlargement 03/21/2018   Surgery, elective 03/16/2017     Referrals to Alternative Service(s): Referred to Alternative Service(s):   Place:   Date:   Time:    Referred to Alternative Service(s):   Place:   Date:   Time:    Referred to Alternative Service(s):   Place:   Date:   Time:    Referred to Alternative Service(s):   Place:   Date:   Time:     Audree Camel, [facility]

## 2021-08-11 NOTE — ED Notes (Signed)
Breakfast tray ordered 

## 2021-08-11 NOTE — BH Assessment (Signed)
Disposition: Per Shuvon Rankin, NP, patient is psychiatrically cleared and recommenced for PHP. Referral was made by NP. Pt sister is wanting to pick pt up from ED and support patient after leaving hospital.

## 2021-08-11 NOTE — ED Provider Notes (Addendum)
[facility] EMERGENCY DEPARTMENT Provider Note   CSN: [id number] Arrival date & time: 08/10/21  2005     History Chief Complaint  Patient presents with   Hallucinations    Shari [name] is a 65 y.o. female.  HPI Patient presents 1 day after being discharged from a local behavioral facility, now with ongoing auditory and visual hallucination.  She notes that she was discharged with medication adjustments she is unaware of, but states that she is taking them regularly.  She denies physical pain, suicidal or homicidal ideation.  She states that despite of her new medication regimen, upon returning home she began hearing hallucinations, and feeling paranoid sensation that her neighbors were threatening her, possibly going to kill her.  No details obtained on reviewing patient's paper discharge instructions from behavioral health facility [place].  Instructions to follow-up with community mental health center, no explicit follow-up date/plan.    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   Graves disease 09/20/2019   Palpitations 05/29/2019   Hyperthyroidism 05/29/2019   Mixed dyslipidemia 05/29/2019   Paroxysmal atrial fibrillation (HCC) 05/29/2019   PMB (postmenopausal bleeding) 03/21/2018   Urinary retention 03/21/2018   Uterine enlargement 03/21/2018   Surgery, elective 03/16/2017    Past Surgical History:  Procedure Laterality Date   APPENDECTOMY     COLONOSCOPY     DILATATION &  CURETTAGE/HYSTEROSCOPY WITH MYOSURE N/A 02/25/2015   Procedure: DILATATION & CURETTAGE/HYSTEROSCOPY WITH MYOSURE;  Surgeon: [name], MD;  Location: [facility] ORS;  Service: Gynecology;  Laterality: N/A;   DILATION AND CURETTAGE OF UTERUS     ENUCLEATION Right 03/16/2017   ENUCLEATION Right 03/16/2017   Procedure: ENUCLEATION RIGHT EYE;  Surgeon: [name], MD;  Location: [facility] OR;  Service: Ophthalmology;  Laterality: Right;   EYE SURGERY     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6, 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     Family History  Problem Relation Age of Onset   Diabetes Mother    CAD Mother    Lung cancer Father     Social History   Tobacco Use   Smoking status: Former    Packs/day: 0.50    Years: 40.00    Pack years: 20.00    Types: Cigarettes    Quit date: 2016    Years since quitting: 6.6   Smokeless tobacco: Never  Vaping Use   Vaping Use: Never used  Substance Use Topics   Alcohol use: Yes    Comment: 03/16/2017 "nothing since 2013"   Drug use: No  Home Medications Prior to Admission medications   Medication Sig Start Date End Date Taking? Authorizing Provider  atenolol (TENORMIN) 25 MG tablet Take 12.5 mg by mouth daily.  05/15/19   [provider]  atorvastatin (LIPITOR) 40 MG tablet Take 1 tablet by mouth daily.    [provider]  lisinopril (PRINIVIL,ZESTRIL) 10 MG tablet Take 10 mg by mouth daily.  02/03/17   [provider]  methimazole (TAPAZOLE) 10 MG tablet Take 1 tablet by mouth daily. 05/15/19   [provider]  pantoprazole (PROTONIX) 40 MG tablet Take 40 mg by mouth 2 (two) times daily.  02/03/17   [provider]    Allergies     Aspirin, Chocolate, Cocoa, Penicillins, and Sulfa antibiotics  Review of Systems   Review of Systems  Constitutional:        Per HPI, otherwise negative  HENT:         Per HPI, otherwise negative  Respiratory:         Per HPI, otherwise negative  Cardiovascular:        Per HPI, otherwise negative  Gastrointestinal:  Negative for vomiting.  Endocrine:       Negative aside from HPI  Genitourinary:        Neg aside from HPI   Musculoskeletal:        Per HPI, otherwise negative  Skin: Negative.   Neurological:  Negative for syncope.  Psychiatric/Behavioral:  Positive for dysphoric mood, hallucinations and sleep disturbance. Negative for suicidal ideas. The patient is nervous/anxious.    Physical Exam Updated Vital Signs BP (!) 148/76 (BP Location: Right Arm)   Pulse 68   Temp 98.6 F (37 C) (Oral)   Resp 15   Ht 5\' 4"  (1.626 m)   Wt 57 kg   SpO2 100%   BMI 21.57 kg/m   Physical Exam Vitals and nursing note reviewed.  Constitutional:      General: [name] not in acute distress.    Appearance: [name] well-developed.  HENT:     Head: Normocephalic and atraumatic.  Eyes:     Conjunctiva/sclera: Conjunctivae normal.  Cardiovascular:     Rate and Rhythm: Normal rate and regular rhythm.  Pulmonary:     Effort: Pulmonary effort is normal. No respiratory distress.     Breath sounds: Normal breath sounds. No stridor.  Abdominal:     General: There is no distension.  Skin:    General: Skin is warm and dry.  Neurological:     Mental Status: [name] alert and oriented to person, place, and time.     Cranial Nerves: No cranial nerve deficit.  Psychiatric:        Behavior: Behavior is slowed and withdrawn.        Thought Content: Thought content is paranoid and delusional. Thought content does not include homicidal or suicidal ideation.    ED Results / Procedures / Treatments   Labs (all labs ordered are listed, but only abnormal results are displayed) Labs Reviewed   COMPREHENSIVE METABOLIC PANEL - Abnormal; Notable for the following components:      Result Value   Glucose, Bld 129 (*)    Creatinine, Ser 1.05 (*)    GFR, Estimated 59 (*)    All other components within normal limits  CBC WITH DIFFERENTIAL/PLATELET - Abnormal; Notable for the following components:   RBC 3.86 (*)    Hemoglobin 10.7 (*)    HCT 34.0 (*)    All other  components within normal limits  RESP PANEL BY RT-PCR (FLU A&B, COVID) ARPGX2  ETHANOL  RAPID URINE DRUG SCREEN, HOSP PERFORMED     Procedures Procedures   Medications Ordered in ED Medications - No data to display  ED Course  I have reviewed the triage vital signs and the nursing notes.  Pertinent labs & imaging results that were available during my care of the patient were reviewed by me and considered in my medical decision making (see chart for details). Chart review notable for transfer from our facility to [facility] behavioral health last month.  Adult female with recent hospitalization for hallucinations now presents 1 day after discharge from another facility with persistent paranoia, hallucinations, inability to return home secondary to these, according to her.  She has no physical complaints, physical exam is reassuring, vitals reassuring, labs reassuring.  Patient medically cleared for behavioral health evaluation.  [name] Covid positive (asymptomatic in this regard).    Final Clinical Impression(s) / ED Diagnoses Final diagnoses:  Paranoia [facility])  Auditory hallucination     Gerhard Munch, MD 08/11/21 0102    Gerhard Munch, MD 08/11/21 1222

## 2021-08-11 NOTE — ED Notes (Signed)
Sister visiting at [facility]

## 2021-08-11 NOTE — ED Notes (Addendum)
Pt psychiatrically cleared per F. Janee Morn, [facility]. EDP aware. Ready to be d/c'd. Collecting pt belongings and valuables. Sister at [facility].

## 2021-08-11 NOTE — ED Notes (Signed)
Pt was wanded by security at this time. Pt 1 belonging bag locked in locker 5. Pt also has valuables locked with security.

## 2021-08-14 ENCOUNTER — Emergency Department (HOSPITAL_COMMUNITY)
Admission: EM | Admit: 2021-08-14 | Discharge: 2021-08-14 | Disposition: A | Discharge status: Home / Self Care | Payer: Medicare Other | Attending: Emergency Medicine | Admitting: Emergency Medicine

## 2021-08-14 ENCOUNTER — Emergency Department (HOSPITAL_COMMUNITY): Payer: Medicare Other

## 2021-08-14 ENCOUNTER — Other Ambulatory Visit: Payer: Self-pay

## 2021-08-14 ENCOUNTER — Encounter (HOSPITAL_COMMUNITY): Payer: Self-pay | Admitting: *Deleted

## 2021-08-14 DIAGNOSIS — E039 Hypothyroidism, unspecified: Secondary | ICD-10-CM | POA: Diagnosis not present

## 2021-08-14 DIAGNOSIS — R079 Chest pain, unspecified: Secondary | ICD-10-CM | POA: Diagnosis not present

## 2021-08-14 DIAGNOSIS — E119 Type 2 diabetes mellitus without complications: Secondary | ICD-10-CM | POA: Insufficient documentation

## 2021-08-14 DIAGNOSIS — I10 Essential (primary) hypertension: Secondary | ICD-10-CM | POA: Diagnosis not present

## 2021-08-14 DIAGNOSIS — R0602 Shortness of breath: Secondary | ICD-10-CM | POA: Insufficient documentation

## 2021-08-14 DIAGNOSIS — R6889 Other general symptoms and signs: Secondary | ICD-10-CM | POA: Diagnosis not present

## 2021-08-14 DIAGNOSIS — R002 Palpitations: Secondary | ICD-10-CM | POA: Insufficient documentation

## 2021-08-14 DIAGNOSIS — Z743 Need for continuous supervision: Secondary | ICD-10-CM | POA: Diagnosis not present

## 2021-08-14 DIAGNOSIS — I499 Cardiac arrhythmia, unspecified: Secondary | ICD-10-CM | POA: Diagnosis not present

## 2021-08-14 DIAGNOSIS — Z79899 Other long term (current) drug therapy: Secondary | ICD-10-CM | POA: Insufficient documentation

## 2021-08-14 DIAGNOSIS — R072 Precordial pain: Secondary | ICD-10-CM | POA: Insufficient documentation

## 2021-08-14 DIAGNOSIS — Z87891 Personal history of nicotine dependence: Secondary | ICD-10-CM | POA: Insufficient documentation

## 2021-08-14 DIAGNOSIS — R0789 Other chest pain: Secondary | ICD-10-CM | POA: Diagnosis not present

## 2021-08-14 DIAGNOSIS — R451 Restlessness and agitation: Secondary | ICD-10-CM | POA: Insufficient documentation

## 2021-08-14 DIAGNOSIS — R1110 Vomiting, unspecified: Secondary | ICD-10-CM | POA: Diagnosis not present

## 2021-08-14 DIAGNOSIS — F419 Anxiety disorder, unspecified: Secondary | ICD-10-CM | POA: Insufficient documentation

## 2021-08-14 LAB — CBC WITH DIFFERENTIAL/PLATELET
Abs Immature Granulocytes: 0.04 10*3/uL (ref 0.00–0.07)
Basophils Absolute: 0 10*3/uL (ref 0.0–0.1)
Basophils Relative: 0 %
Eosinophils Absolute: 0 10*3/uL (ref 0.0–0.5)
Eosinophils Relative: 0 %
HCT: 38.1 % (ref 36.0–46.0)
Hemoglobin: 12.3 g/dL (ref 12.0–15.0)
Immature Granulocytes: 1 %
Lymphocytes Relative: 20 %
Lymphs Abs: 1.8 10*3/uL (ref 0.7–4.0)
MCH: 28.3 pg (ref 26.0–34.0)
MCHC: 32.3 g/dL (ref 30.0–36.0)
MCV: 87.8 fL (ref 80.0–100.0)
Monocytes Absolute: 0.6 10*3/uL (ref 0.1–1.0)
Monocytes Relative: 7 %
Neutro Abs: 6.3 10*3/uL (ref 1.7–7.7)
Neutrophils Relative %: 72 %
Platelets: ADEQUATE 10*3/uL (ref 150–400)
RBC: 4.34 MIL/uL (ref 3.87–5.11)
RDW: 14.3 % (ref 11.5–15.5)
Smear Review: NORMAL
WBC: 8.8 10*3/uL (ref 4.0–10.5)
nRBC: 0 % (ref 0.0–0.2)

## 2021-08-14 LAB — COMPREHENSIVE METABOLIC PANEL
ALT: 13 U/L (ref 0–44)
AST: 28 U/L (ref 15–41)
Albumin: 4 g/dL (ref 3.5–5.0)
Alkaline Phosphatase: 101 U/L (ref 38–126)
Anion gap: 9 (ref 5–15)
BUN: 11 mg/dL (ref 8–23)
CO2: 22 mmol/L (ref 22–32)
Calcium: 10.2 mg/dL (ref 8.9–10.3)
Chloride: 107 mmol/L (ref 98–111)
Creatinine, Ser: 0.86 mg/dL (ref 0.44–1.00)
GFR, Estimated: >60 mL/min (ref >60)
Glucose, Bld: 113 mg/dL — ABNORMAL HIGH (ref 70–99)
Potassium: 4.8 mmol/L (ref 3.5–5.1)
Sodium: 138 mmol/L (ref 135–145)
Total Bilirubin: 1 mg/dL (ref 0.3–1.2)
Total Protein: 7.5 g/dL (ref 6.5–8.1)

## 2021-08-14 LAB — TROPONIN I (HIGH SENSITIVITY): Troponin I (High Sensitivity): 9 ng/L (ref <18)

## 2021-08-14 LAB — TSH: TSH: 1.376 u[IU]/mL (ref 0.350–4.500)

## 2021-08-14 MED ORDER — HYDROXYZINE HCL 25 MG PO TABS: 25.0000 mg | ORAL_TABLET | Freq: Four times a day (QID) | ORAL | 0 refills | Status: AC

## 2021-08-14 NOTE — ED Triage Notes (Signed)
Pt to ED by EMS for anxiety. Reports being stressed by sister at home. Recent discharge from psychiatric hospital. Reported palpitations with EMS.  EMS VS 142/82, 92, 18 resp

## 2021-08-14 NOTE — Discharge Instructions (Signed)
Please follow-up with your primary care provider.  I have also given you the information for a cardiologist to follow-up with.  Please return to the ER should he experience any other new or concerning symptoms or any return of chest pain.  I have prescribed a medication called hydroxyzine to take he may take it every 6 hours as needed for anxiety like symptoms.  Please read the attached information about counseling and therapy.

## 2021-08-14 NOTE — ED Notes (Signed)
Patient given discharge instructions, all questions answered. Patient in possession of all belongings, directed to the discharge area  

## 2021-08-14 NOTE — ED Notes (Signed)
MD at bedside. 

## 2021-08-14 NOTE — ED Notes (Signed)
Got patient undressed into a gown on the monitor got patient a warm blanket patient is resting with call bell in reach  

## 2021-08-14 NOTE — ED Notes (Signed)
Pt c/o chest pain since having a disagreement with her sister. Reports increased "mental health" since argument. Denies SI/HI

## 2021-08-14 NOTE — ED Provider Notes (Signed)
Emergency Medicine Provider Triage Evaluation Note  Shari [name] , a 64 y.o. female  was evaluated in triage.  Pt complains of anxiety, chest pain, shortness of breath and palpitations.  Reports she was recently discharged from a behavioral health facility and has been struggling with depression, anxiety and her mental health.  Reports she got into a verbal altercation with her sister this evening and that caused palpitations, chest pain and shortness of breath.  Reports she is nervous about this.  She does have a history of paroxysmal A. Fib and Graves' disease.  Review of Systems  Positive: Palpitations, chest pain, shortness of breath Negative: Fever, chills  Physical Exam  BP (!) 141/76   Pulse 92   Temp 98.9 F (37.2 C) (Oral)   Resp 16   SpO2 99%  Gen:   Awake, no distress   Resp:  Normal effort  MSK:   Moves extremities without difficulty  Other:  anxious  Medical Decision Making  Medically screening exam initiated at 5:29 AM.  Appropriate orders placed.  Shari [name] was informed that the remainder of the evaluation will be completed by another provider, this initial triage assessment does not replace that evaluation, and the importance of remaining in the ED until their evaluation is complete.  Patient presents with anxiety, chest pain and shortness of breath.  Labs pending.   Shari Forth, PA-C 08/14/21 0531    Shari Crease, MD 08/14/21 [phone]

## 2021-08-27 NOTE — ED Provider Notes (Signed)
[facility] EMERGENCY DEPARTMENT Provider Note   CSN: [id number] Arrival date & time: 08/14/21  0503     History Chief Complaint  Patient presents with   Anxiety    JOHNELL LANDOWSKI is a 65 y.o. female.  HPI Patient is a 65 year old female with past medical history significant for reflux, HLD, HTN, DM2, Graves' disease, palpitations, PAF  Patient presents to the emergency department today with complaints of anxiety, chest pain, shortness of breath and palpitations.  She states that she was recently discharged from inpatient psychiatric hospital and states that she continues to struggle with depression anxiety and her mental health although she denies any SI, HI or AVH.  She states that she got in a verbal disagreement with her sister which escalated into yelling at each other.  She states that after this episode she started having some heart palpitations and some sternal chest pain that is an ache does not have any pleuritic or exertional components and she denies any hemoptysis cancer history leg swelling worsening of shortness of breath although she did states that she felt short of breath during a disagreement.  No other significant associated symptoms.  She states her chest pain is mostly resolved at this time.    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   Graves disease 09/20/2019   Palpitations 05/29/2019   Hyperthyroidism 05/29/2019   Mixed dyslipidemia 05/29/2019   Paroxysmal atrial fibrillation (HCC) 05/29/2019   PMB  (postmenopausal bleeding) 03/21/2018   Urinary retention 03/21/2018   Uterine enlargement 03/21/2018   Surgery, elective 03/16/2017    Past Surgical History:  Procedure Laterality Date   APPENDECTOMY     COLONOSCOPY     DILATATION & CURETTAGE/HYSTEROSCOPY WITH MYOSURE N/A 02/25/2015   Procedure: DILATATION & CURETTAGE/HYSTEROSCOPY WITH MYOSURE;  Surgeon: [name], MD;  Location: [facility];  Service: Gynecology;  Laterality: N/A;   DILATION AND CURETTAGE OF UTERUS     ENUCLEATION Right 03/16/2017   ENUCLEATION Right 03/16/2017   Procedure: ENUCLEATION RIGHT EYE;  Surgeon: [name], MD;  Location: [facility];  Service: Ophthalmology;  Laterality: Right;   EYE SURGERY     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6, 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     Family History  Problem Relation Age of Onset   Diabetes Mother    CAD Mother    Lung cancer Father     Social History   Tobacco Use   Smoking status: Former    Packs/day: 0.50    Years: 40.00    Pack years: 20.00    Types:  Cigarettes    Quit date: 2016    Years since quitting: 6.7   Smokeless tobacco: Never  Vaping Use   Vaping Use: Never used  Substance Use Topics   Alcohol use: Yes    Comment: 03/16/2017 "nothing since 2013"   Drug use: No    Home Medications Prior to Admission medications   Medication Sig Start Date End Date Taking? Authorizing Provider  hydrOXYzine (ATARAX/VISTARIL) 25 MG tablet Take 1 tablet (25 mg total) by mouth every 6 (six) hours for 21 days. 08/14/21 09/04/21 Yes Amelianna Meller S, PA  atenolol (TENORMIN) 25 MG tablet Take 12.5 mg by mouth daily.  05/15/19   [provider]  atorvastatin (LIPITOR) 40 MG tablet Take 1  tablet by mouth daily.    [provider]  divalproex (DEPAKOTE ER) 250 MG 24 hr tablet Take 750 mg by mouth at bedtime. 08/06/21   [provider]  ibuprofen (ADVIL) 800 MG tablet Take 800 mg by mouth 3 (three) times daily. 05/05/21   [provider]  lisinopril (PRINIVIL,ZESTRIL) 10 MG tablet Take 10 mg by mouth daily.  02/03/17   [provider]  methimazole (TAPAZOLE) 10 MG tablet Take 1 tablet by mouth daily. 05/15/19   [provider]  pantoprazole (PROTONIX) 40 MG tablet Take 40 mg by mouth 2 (two) times daily.  02/03/17   [provider]  risperiDONE (RISPERDAL) 0.5 MG tablet Take 0.5 mg by mouth at bedtime. 08/06/21   [provider]  risperiDONE (RISPERDAL) 1 MG tablet Take 1 mg by mouth at bedtime. 08/06/21   [provider]    Allergies    Aspirin, Chocolate, Cocoa, Penicillins, and Sulfa antibiotics  Review of Systems   Review of Systems  Constitutional:  Negative for chills and fever.  HENT:  Negative for congestion.   Eyes:  Negative for pain.  Respiratory:  Positive for shortness of breath. Negative for cough.   Cardiovascular:  Positive for chest pain and palpitations. Negative for leg swelling.  Gastrointestinal:  Negative for abdominal pain, diarrhea, nausea and vomiting.  Genitourinary:  Negative for dysuria.  Musculoskeletal:  Negative for myalgias.  Skin:  Negative for rash.  Neurological:  Negative for dizziness and headaches.  Psychiatric/Behavioral:  Positive for agitation.    Physical Exam Updated Vital Signs BP 132/67   Pulse 75   Temp 99 F (37.2 C) (Oral)   Resp 19   SpO2 100%   Physical Exam Vitals and nursing note reviewed.  Constitutional:      General: [name] not in acute distress.    Comments: Pleasant well-appearing 65 year old.  In no acute distress.  Sitting comfortably in bed.  Able answer questions appropriately follow commands. No increased work of breathing. Speaking in full  sentences.   HENT:     Head: Normocephalic and atraumatic.     Nose: Nose normal.  Eyes:     General: No scleral icterus. Cardiovascular:     Rate and Rhythm: Normal rate and regular rhythm.     Pulses: Normal pulses.     Heart sounds: Normal heart sounds.  Pulmonary:     Effort: Pulmonary effort is normal. No respiratory distress.     Breath sounds: No wheezing.  Abdominal:     Palpations: Abdomen is soft.     Tenderness: There is no abdominal tenderness. There is no guarding or rebound.  Musculoskeletal:     Cervical back: Normal range of motion.     Right lower leg: No  edema.     Left lower leg: No edema.     Comments: No lower extremity edema or calf tenderness.  Legs are symmetric.  Skin:    General: Skin is warm and dry.     Capillary Refill: Capillary refill takes less than 2 seconds.  Neurological:     Mental Status: [name] alert. Mental status is at baseline.  Psychiatric:        Mood and Affect: Mood normal.        Behavior: Behavior normal.    ED Results / Procedures / Treatments   Labs (all labs ordered are listed, but only abnormal results are displayed) Labs Reviewed  COMPREHENSIVE METABOLIC PANEL - Abnormal; Notable for the following components:      Result Value   Glucose, Bld 113 (*)    All other components within normal limits  CBC WITH DIFFERENTIAL/PLATELET  TSH  TROPONIN I (HIGH SENSITIVITY)    EKG EKG Interpretation  Date/Time:  Saturday August 14 2021 05:31:01 EDT Ventricular Rate:  90 PR Interval:  132 QRS Duration: 74 QT Interval:  330 QTC Calculation: 403 R Axis:   68 Text Interpretation: Normal sinus rhythm Minimal voltage criteria for LVH, may be normal variant ( Sokolow-Lyon ) T wave abnormality, consider inferior ischemia T wave abnormality, consider anterolateral ischemia Abnormal ECG similar to 8/12 Confirmed by Marily Memos [phone]) on 08/14/2021 5:34:54 AM  Radiology No results found.  Procedures Procedures   Medications  Ordered in ED Medications - No data to display  ED Course  I have reviewed the triage vital signs and the nursing notes.  Pertinent labs & imaging results that were available during my care of the patient were reviewed by me and considered in my medical decision making (see chart for details).    MDM Rules/Calculators/A&P                          Patient is well-appearing 65 year old female [name] presented to the ER today with complaints of palpitation chest pain shortness of breath her symptoms of mostly resolved at this time her symptoms came on during a verbal disagreement with her sister which escalated to yelling at each other  Physical exam is unremarkable.  Normal vital signs and well-appearing  CBC without leukocytosis or anemia TSH within normal limits troponin x2 within normal limits CMP unremarkable Chest x-ray personally reviewed agree of radiology read no acute disease  EKG normal sinus rhythm somewhat abnormal T wave pattern however unchanged from prior EKGs reviewed by Dr. Durwin Nora.  Discussed case with Dr. Durwin Nora prior to discharge  Patient ultimately asymptomatic at time of discharge feels well reassuring work-up will provide with Vistaril, educated patient on extremely strict return precautions and recommended follow-up with PCP as well as behavioral health she is agreeable to plan and understanding return precautions.  Discharged home with all questions answered best my ability.  Final Clinical Impression(s) / ED Diagnoses Final diagnoses:  Palpitation  Chest pain, unspecified type    Rx / DC Orders ED Discharge Orders          Ordered    hydrOXYzine (ATARAX/VISTARIL) 25 MG tablet  Every 6 hours        08/14/21 1329             Solon Augusta Duryea, [place] 08/28/21 0038    Gloris Manchester, MD 08/28/21 1422

## 2021-10-06 DIAGNOSIS — Z87891 Personal history of nicotine dependence: Secondary | ICD-10-CM | POA: Insufficient documentation

## 2021-10-06 DIAGNOSIS — Z79899 Other long term (current) drug therapy: Secondary | ICD-10-CM | POA: Insufficient documentation

## 2021-10-06 DIAGNOSIS — Z20822 Contact with and (suspected) exposure to covid-19: Secondary | ICD-10-CM | POA: Insufficient documentation

## 2021-10-06 DIAGNOSIS — F29 Unspecified psychosis not due to a substance or known physiological condition: Secondary | ICD-10-CM | POA: Insufficient documentation

## 2021-10-07 ENCOUNTER — Telehealth (HOSPITAL_COMMUNITY): Payer: Self-pay | Admitting: Internal Medicine

## 2021-10-07 ENCOUNTER — Ambulatory Visit (HOSPITAL_COMMUNITY)
Admission: EM | Admit: 2021-10-07 | Discharge: 2021-10-07 | Disposition: A | Discharge status: Other Acute Inpatient Hospital | Payer: Medicare Other | Attending: Nurse Practitioner | Admitting: Nurse Practitioner

## 2021-10-07 ENCOUNTER — Inpatient Hospital Stay (HOSPITAL_COMMUNITY)
Admission: AD | Admit: 2021-10-07 | Discharge: 2021-10-26 | DRG: 885 | Disposition: A | Discharge status: Home / Self Care | Payer: Medicare Other | Source: Intra-hospital | Attending: Psychiatry | Admitting: Psychiatry

## 2021-10-07 DIAGNOSIS — F200 Paranoid schizophrenia: Secondary | ICD-10-CM | POA: Diagnosis present

## 2021-10-07 DIAGNOSIS — Z880 Allergy status to penicillin: Secondary | ICD-10-CM | POA: Diagnosis not present

## 2021-10-07 DIAGNOSIS — K219 Gastro-esophageal reflux disease without esophagitis: Secondary | ICD-10-CM | POA: Diagnosis not present

## 2021-10-07 DIAGNOSIS — I4891 Unspecified atrial fibrillation: Secondary | ICD-10-CM | POA: Diagnosis not present

## 2021-10-07 DIAGNOSIS — M47819 Spondylosis without myelopathy or radiculopathy, site unspecified: Secondary | ICD-10-CM | POA: Diagnosis present

## 2021-10-07 DIAGNOSIS — E7800 Pure hypercholesterolemia, unspecified: Secondary | ICD-10-CM | POA: Diagnosis present

## 2021-10-07 DIAGNOSIS — Z801 Family history of malignant neoplasm of trachea, bronchus and lung: Secondary | ICD-10-CM | POA: Diagnosis not present

## 2021-10-07 DIAGNOSIS — H547 Unspecified visual loss: Secondary | ICD-10-CM | POA: Diagnosis present

## 2021-10-07 DIAGNOSIS — Z9114 Patient's other noncompliance with medication regimen: Secondary | ICD-10-CM | POA: Diagnosis not present

## 2021-10-07 DIAGNOSIS — Z87891 Personal history of nicotine dependence: Secondary | ICD-10-CM

## 2021-10-07 DIAGNOSIS — Z970 Presence of artificial eye: Secondary | ICD-10-CM | POA: Diagnosis not present

## 2021-10-07 DIAGNOSIS — Z886 Allergy status to analgesic agent status: Secondary | ICD-10-CM | POA: Diagnosis not present

## 2021-10-07 DIAGNOSIS — G3184 Mild cognitive impairment, so stated: Secondary | ICD-10-CM | POA: Diagnosis present

## 2021-10-07 DIAGNOSIS — Z833 Family history of diabetes mellitus: Secondary | ICD-10-CM

## 2021-10-07 DIAGNOSIS — F29 Unspecified psychosis not due to a substance or known physiological condition: Secondary | ICD-10-CM

## 2021-10-07 DIAGNOSIS — Z882 Allergy status to sulfonamides status: Secondary | ICD-10-CM

## 2021-10-07 DIAGNOSIS — I10 Essential (primary) hypertension: Secondary | ICD-10-CM | POA: Diagnosis not present

## 2021-10-07 DIAGNOSIS — M5430 Sciatica, unspecified side: Secondary | ICD-10-CM | POA: Diagnosis present

## 2021-10-07 DIAGNOSIS — E785 Hyperlipidemia, unspecified: Secondary | ICD-10-CM | POA: Diagnosis not present

## 2021-10-07 DIAGNOSIS — Z20822 Contact with and (suspected) exposure to covid-19: Secondary | ICD-10-CM | POA: Diagnosis not present

## 2021-10-07 DIAGNOSIS — K5909 Other constipation: Secondary | ICD-10-CM | POA: Diagnosis present

## 2021-10-07 DIAGNOSIS — E0590 Thyrotoxicosis, unspecified without thyrotoxic crisis or storm: Secondary | ICD-10-CM | POA: Diagnosis present

## 2021-10-07 DIAGNOSIS — F23 Brief psychotic disorder: Secondary | ICD-10-CM | POA: Diagnosis present

## 2021-10-07 DIAGNOSIS — Z79899 Other long term (current) drug therapy: Secondary | ICD-10-CM | POA: Diagnosis not present

## 2021-10-07 DIAGNOSIS — Z8249 Family history of ischemic heart disease and other diseases of the circulatory system: Secondary | ICD-10-CM | POA: Diagnosis not present

## 2021-10-07 DIAGNOSIS — F209 Schizophrenia, unspecified: Principal | ICD-10-CM

## 2021-10-07 DIAGNOSIS — Z5901 Sheltered homelessness: Secondary | ICD-10-CM

## 2021-10-07 LAB — CBC WITH DIFFERENTIAL/PLATELET
Abs Immature Granulocytes: 0.01 10*3/uL (ref 0.00–0.07)
Basophils Absolute: 0 10*3/uL (ref 0.0–0.1)
Basophils Relative: 1 %
Eosinophils Absolute: 0 10*3/uL (ref 0.0–0.5)
Eosinophils Relative: 0 %
HCT: 38.1 % (ref 36.0–46.0)
Hemoglobin: 12.4 g/dL (ref 12.0–15.0)
Immature Granulocytes: 0 %
Lymphocytes Relative: 39 %
Lymphs Abs: 2.1 10*3/uL (ref 0.7–4.0)
MCH: 28.6 pg (ref 26.0–34.0)
MCHC: 32.5 g/dL (ref 30.0–36.0)
MCV: 88 fL (ref 80.0–100.0)
Monocytes Absolute: 0.2 10*3/uL (ref 0.1–1.0)
Monocytes Relative: 4 %
Neutro Abs: 3.1 10*3/uL (ref 1.7–7.7)
Neutrophils Relative %: 56 %
Platelets: 311 10*3/uL (ref 150–400)
RBC: 4.33 MIL/uL (ref 3.87–5.11)
RDW: 13.1 % (ref 11.5–15.5)
WBC: 5.5 10*3/uL (ref 4.0–10.5)
nRBC: 0 % (ref 0.0–0.2)

## 2021-10-07 LAB — COMPREHENSIVE METABOLIC PANEL
ALT: 11 U/L (ref 0–44)
AST: 16 U/L (ref 15–41)
Albumin: 4.1 g/dL (ref 3.5–5.0)
Alkaline Phosphatase: 99 U/L (ref 38–126)
Anion gap: 9 (ref 5–15)
BUN: 7 mg/dL — ABNORMAL LOW (ref 8–23)
CO2: 26 mmol/L (ref 22–32)
Calcium: 9.9 mg/dL (ref 8.9–10.3)
Chloride: 103 mmol/L (ref 98–111)
Creatinine, Ser: 0.83 mg/dL (ref 0.44–1.00)
GFR, Estimated: >60 mL/min (ref >60)
Glucose, Bld: 108 mg/dL — ABNORMAL HIGH (ref 70–99)
Potassium: 3.5 mmol/L (ref 3.5–5.1)
Sodium: 138 mmol/L (ref 135–145)
Total Bilirubin: 0.8 mg/dL (ref 0.3–1.2)
Total Protein: 7.3 g/dL (ref 6.5–8.1)

## 2021-10-07 LAB — TSH: TSH: 1.231 u[IU]/mL (ref 0.350–4.500)

## 2021-10-07 LAB — URINALYSIS, ROUTINE W REFLEX MICROSCOPIC
Bilirubin Urine: NEGATIVE
Glucose, UA: NEGATIVE mg/dL
Ketones, ur: NEGATIVE mg/dL
Nitrite: NEGATIVE
Protein, ur: NEGATIVE mg/dL
Specific Gravity, Urine: 1.009 (ref 1.005–1.030)
pH: 5 (ref 5.0–8.0)

## 2021-10-07 LAB — RESP PANEL BY RT-PCR (FLU A&B, COVID) ARPGX2
Influenza A by PCR: NEGATIVE
Influenza B by PCR: NEGATIVE
SARS Coronavirus 2 by RT PCR: NEGATIVE

## 2021-10-07 LAB — HEMOGLOBIN A1C
Hgb A1c MFr Bld: 5.6 % (ref 4.8–5.6)
Mean Plasma Glucose: 114.02 mg/dL

## 2021-10-07 LAB — ETHANOL: Alcohol, Ethyl (B): <10 mg/dL (ref <10)

## 2021-10-07 LAB — POCT URINE DRUG SCREEN - MANUAL ENTRY (I-SCREEN)
POC Amphetamine UR: NOT DETECTED
POC Buprenorphine (BUP): NOT DETECTED
POC Cocaine UR: NOT DETECTED
POC Marijuana UR: NOT DETECTED
POC Methadone UR: NOT DETECTED
POC Methamphetamine UR: NOT DETECTED
POC Morphine: NOT DETECTED
POC Oxazepam (BZO): NOT DETECTED
POC Oxycodone UR: NOT DETECTED
POC Secobarbital (BAR): NOT DETECTED

## 2021-10-07 LAB — LIPID PANEL
Cholesterol: 253 mg/dL — ABNORMAL HIGH (ref 0–200)
HDL: 56 mg/dL (ref >40)
LDL Cholesterol: 177 mg/dL — ABNORMAL HIGH (ref 0–99)
Total CHOL/HDL Ratio: 4.5 RATIO
Triglycerides: 99 mg/dL (ref <150)
VLDL: 20 mg/dL (ref 0–40)

## 2021-10-07 LAB — POC SARS CORONAVIRUS 2 AG -  ED: SARS Coronavirus 2 Ag: NEGATIVE

## 2021-10-07 LAB — VALPROIC ACID LEVEL: Valproic Acid Lvl: <10 ug/mL — ABNORMAL LOW (ref 50.0–100.0)

## 2021-10-07 MED ORDER — MAGNESIUM HYDROXIDE 400 MG/5ML PO SUSP
30.0000 mL | Freq: Every day | ORAL | Status: DC | PRN
Start: 2021-10-07 — End: 2021-10-07

## 2021-10-07 MED ORDER — CIPROFLOXACIN HCL 500 MG PO TABS
500.0000 mg | ORAL_TABLET | Freq: Two times a day (BID) | ORAL | Status: DC
  Administered 2021-10-07: 500 mg via ORAL
  Filled 2021-10-07 (×3): qty 1

## 2021-10-07 MED ORDER — TRAZODONE HCL 50 MG PO TABS
50.0000 mg | ORAL_TABLET | Freq: Every evening | ORAL | Status: DC | PRN
  Administered 2021-10-07 – 2021-10-21 (×8): 50 mg via ORAL
  Filled 2021-10-07 (×11): qty 1

## 2021-10-07 MED ORDER — CIPROFLOXACIN HCL 250 MG PO TABS
500.0000 mg | ORAL_TABLET | Freq: Two times a day (BID) | ORAL | Status: DC
  Filled 2021-10-07: qty 2

## 2021-10-07 MED ORDER — RISPERIDONE 1 MG PO TABS: 1.0000 mg | ORAL_TABLET | Freq: Every day | ORAL | Status: DC

## 2021-10-07 MED ORDER — LISINOPRIL 10 MG PO TABS
10.0000 mg | ORAL_TABLET | Freq: Every day | ORAL | Status: DC
  Administered 2021-10-07 – 2021-10-08 (×2): 10 mg via ORAL
  Filled 2021-10-07: qty 1
  Filled 2021-10-07: qty 2
  Filled 2021-10-07 (×3): qty 1

## 2021-10-07 MED ORDER — RISPERIDONE 0.25 MG PO TABS
0.2500 mg | ORAL_TABLET | Freq: Once | ORAL | Status: AC
  Administered 2021-10-07: 0.25 mg via ORAL
  Filled 2021-10-07: qty 1

## 2021-10-07 MED ORDER — METHIMAZOLE 10 MG PO TABS
10.0000 mg | ORAL_TABLET | Freq: Every day | ORAL | Status: DC
  Filled 2021-10-07 (×2): qty 1

## 2021-10-07 MED ORDER — ALUM & MAG HYDROXIDE-SIMETH 200-200-20 MG/5ML PO SUSP
30.0000 mL | ORAL | Status: DC | PRN
  Administered 2021-10-23: 30 mL via ORAL
  Filled 2021-10-07: qty 30

## 2021-10-07 MED ORDER — HYDROXYZINE HCL 25 MG PO TABS: 25.0000 mg | ORAL_TABLET | Freq: Three times a day (TID) | ORAL | Status: DC | PRN

## 2021-10-07 MED ORDER — ATORVASTATIN CALCIUM 40 MG PO TABS
40.0000 mg | ORAL_TABLET | Freq: Every day | ORAL | Status: DC
  Filled 2021-10-07: qty 1

## 2021-10-07 MED ORDER — ACETAMINOPHEN 325 MG PO TABS: 650.0000 mg | ORAL_TABLET | Freq: Four times a day (QID) | ORAL | Status: DC | PRN

## 2021-10-07 MED ORDER — LISINOPRIL 10 MG PO TABS
10.0000 mg | ORAL_TABLET | Freq: Every day | ORAL | Status: DC
  Filled 2021-10-07: qty 1

## 2021-10-07 MED ORDER — HYDROXYZINE HCL 25 MG PO TABS
25.0000 mg | ORAL_TABLET | Freq: Three times a day (TID) | ORAL | Status: DC | PRN
  Administered 2021-10-11 – 2021-10-17 (×4): 25 mg via ORAL
  Filled 2021-10-07 (×6): qty 1

## 2021-10-07 MED ORDER — ALUM & MAG HYDROXIDE-SIMETH 200-200-20 MG/5ML PO SUSP
30.0000 mL | ORAL | Status: DC | PRN
Start: 2021-10-07 — End: 2021-10-07

## 2021-10-07 MED ORDER — PANTOPRAZOLE SODIUM 40 MG PO TBEC
40.0000 mg | DELAYED_RELEASE_TABLET | Freq: Two times a day (BID) | ORAL | Status: DC
  Filled 2021-10-07: qty 1

## 2021-10-07 MED ORDER — ATENOLOL 12.5 MG HALF TABLET
12.5000 mg | ORAL_TABLET | Freq: Every day | ORAL | Status: DC
  Administered 2021-10-07 – 2021-10-08 (×2): 12.5 mg via ORAL
  Filled 2021-10-07 (×7): qty 1

## 2021-10-07 MED ORDER — RISPERIDONE 1 MG PO TABS
1.0000 mg | ORAL_TABLET | Freq: Every day | ORAL | Status: DC
  Administered 2021-10-07: 1 mg via ORAL
  Filled 2021-10-07 (×4): qty 1

## 2021-10-07 MED ORDER — TRAZODONE HCL 50 MG PO TABS: 50.0000 mg | ORAL_TABLET | Freq: Every evening | ORAL | Status: DC | PRN

## 2021-10-07 MED ORDER — ACETAMINOPHEN 325 MG PO TABS
650.0000 mg | ORAL_TABLET | Freq: Four times a day (QID) | ORAL | Status: DC | PRN
  Administered 2021-10-18: 650 mg via ORAL
  Filled 2021-10-07 (×2): qty 2

## 2021-10-07 MED ORDER — DIVALPROEX SODIUM 500 MG PO DR TAB
500.0000 mg | DELAYED_RELEASE_TABLET | Freq: Every day | ORAL | Status: DC
  Administered 2021-10-07 – 2021-10-25 (×19): 500 mg via ORAL
  Filled 2021-10-07 (×11): qty 1
  Filled 2021-10-07: qty 7
  Filled 2021-10-07 (×15): qty 1

## 2021-10-07 MED ORDER — MAGNESIUM HYDROXIDE 400 MG/5ML PO SUSP
30.0000 mL | Freq: Every day | ORAL | Status: DC | PRN
  Administered 2021-10-16 – 2021-10-23 (×2): 30 mL via ORAL
  Filled 2021-10-07 (×2): qty 30

## 2021-10-07 MED ORDER — ATENOLOL 25 MG PO TABS: 12.5000 mg | ORAL_TABLET | Freq: Every day | ORAL | Status: DC

## 2021-10-07 MED ORDER — DIVALPROEX SODIUM 250 MG PO DR TAB
250.0000 mg | DELAYED_RELEASE_TABLET | Freq: Once | ORAL | Status: AC
  Administered 2021-10-07: 250 mg via ORAL
  Filled 2021-10-07: qty 1

## 2021-10-07 MED ORDER — ATENOLOL 25 MG PO TABS
12.5000 mg | ORAL_TABLET | Freq: Every day | ORAL | Status: DC
  Filled 2021-10-07: qty 1

## 2021-10-07 MED ORDER — DIVALPROEX SODIUM ER 500 MG PO TB24: 500.0000 mg | ORAL_TABLET | Freq: Every day | ORAL | Status: DC

## 2021-10-07 MED ORDER — CIPROFLOXACIN HCL 500 MG PO TABS: 500.0000 mg | ORAL_TABLET | Freq: Two times a day (BID) | ORAL | Status: DC

## 2021-10-07 MED ORDER — ENSURE ENLIVE PO LIQD
237.0000 mL | Freq: Two times a day (BID) | ORAL | Status: DC
  Administered 2021-10-08 – 2021-10-11 (×7): 237 mL via ORAL
  Filled 2021-10-07 (×11): qty 237

## 2021-10-07 MED ORDER — ATORVASTATIN CALCIUM 40 MG PO TABS
40.0000 mg | ORAL_TABLET | Freq: Every day | ORAL | Status: DC
  Administered 2021-10-07 – 2021-10-26 (×20): 40 mg via ORAL
  Filled 2021-10-07 (×26): qty 1
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
## 2021-10-07 NOTE — Discharge Instructions (Signed)
Take all medications as prescribed. Keep all follow-up appointments as scheduled.  Do not consume alcohol or use illegal drugs while on prescription medications. Report any adverse effects from your medications to your primary care provider promptly.  In the event of recurrent symptoms or worsening symptoms, call 911, a crisis hotline, or go to the nearest emergency department for evaluation.   

## 2021-10-07 NOTE — Progress Notes (Signed)
Pt accepted to [facility] 500-2    Patient meets inpatient criteria per Nira Conn, NP   The attending provider will be Haze Rushing, MD  Call report to [phone]    Rejeana Brock, RN @ [facility] notified.     Pt scheduled  to arrive at [facility] TODAY. Pt's bed is currently ready.   Damita Dunnings, MSW, LCSW-A  3:27 PM 10/07/2021

## 2021-10-07 NOTE — BH Assessment (Signed)
Care Management - Follow Up [facility] Discharges   Patient has been placed in an inpatient psychiatric hospital [facility]) on 10-07-2021.
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
## 2021-10-07 NOTE — ED Notes (Signed)
Erlene Quan NP contacted ok to give depakote and risperdal at 0600 vitals check.

## 2021-10-07 NOTE — Plan of Care (Signed)
  Problem: Education: Goal: Emotional status will improve Outcome: Not Progressing Goal: Mental status will improve Outcome: Not Progressing   Problem: Activity: Goal: Interest or engagement in activities will improve Outcome: Not Progressing   

## 2021-10-07 NOTE — ED Notes (Signed)
Pt was offered morning meds twice.  Refused both times.  Provider notified.

## 2021-10-07 NOTE — ED Provider Notes (Addendum)
Shari [name] continues to meet inpatient criteria.  Patient was seen and evaluated this morning.  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 " I don't know you."   Chart reviewed UDS positive for leukocytes-UTI initiating antibiotics. Patient is under review at [facility]-

## 2021-10-07 NOTE — Progress Notes (Signed)
     10/07/21 2103  Psych Admission Type (Psych Patients Only)  Admission Status Involuntary  Psychosocial Assessment  Patient Complaints Agitation;Irritability  Eye Contact Suspiciousness;Watchful  Facial Expression Angry;Worried  Affect Preoccupied;Apprehensive;Angry  Speech Unremarkable  Interaction Guarded;Defensive  Motor Activity Restless  Appearance/Hygiene Unremarkable  Behavior Characteristics Anxious  Mood Preoccupied;Suspicious;Labile  Thought Process  Coherency Tangential  Content Blaming others;Preoccupation;Paranoia;Delusions  Delusions Paranoid;Persecutory  Perception Hallucinations  Hallucination Auditory  Judgment Impaired  Confusion Mild  Danger to Self  Current suicidal ideation? Denies  Danger to Others  Danger to Others None reported or observed

## 2021-10-07 NOTE — ED Provider Notes (Signed)
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 mmol/L Final   CO2 08/10/2021 22  22 - 32 mmol/L Final   Glucose, Bld 08/10/2021 129 (A)  70 - 99 mg/dL Final   Glucose reference range applies only to samples taken after fasting for at least 8 hours.   BUN 08/10/2021 16  8 - 23 mg/dL Final   Creatinine, Ser 08/10/2021 1.05 (A)  0.44 - 1.00 mg/dL Final   Calcium 08/10/2021 9.6  8.9 - 10.3 mg/dL Final   Total Protein 08/10/2021 6.7  6.5 - 8.1 g/dL Final   Albumin 08/10/2021 3.6  3.5 - 5.0  g/dL Final   AST 08/10/2021 18  15 - 41 U/L Final   ALT 08/10/2021 16  0 - 44 U/L Final   Alkaline Phosphatase 08/10/2021 84  38 - 126 U/L Final   Total Bilirubin 08/10/2021 0.3  0.3 - 1.2 mg/dL Final   GFR,  Estimated 08/10/2021 59 (A)  >60 mL/min Final   Comment: (NOTE) Calculated using the CKD-EPI Creatinine Equation (2021)    Anion gap 08/10/2021 9  5 - 15 Final   Performed at [facility], [place] [address], [place], [place] [address]   Alcohol, Ethyl (B) 08/10/2021 <10  <10 mg/dL Final   Comment: (NOTE) Lowest detectable limit for serum alcohol is 10 mg/dL.  For medical purposes only. Performed at [facility], [place] [address], [place], [place] [address]    Opiates 08/10/2021 NONE DETECTED  NONE DETECTED Final   Cocaine 08/10/2021 NONE DETECTED  NONE DETECTED Final   Benzodiazepines 08/10/2021 NONE DETECTED  NONE DETECTED Final   Amphetamines 08/10/2021 NONE DETECTED  NONE DETECTED Final   Tetrahydrocannabinol 08/10/2021 NONE DETECTED  NONE DETECTED Final   Barbiturates 08/10/2021 NONE DETECTED  NONE DETECTED Final   Comment: (NOTE) DRUG SCREEN FOR MEDICAL PURPOSES ONLY.  IF CONFIRMATION IS NEEDED FOR ANY PURPOSE, NOTIFY LAB WITHIN 5 DAYS.  LOWEST DETECTABLE LIMITS FOR URINE DRUG SCREEN Drug Class                     Cutoff (ng/mL) Amphetamine and metabolites    1000 Barbiturate and metabolites    200 Benzodiazepine                 [id number] Tricyclics and metabolites     300 Opiates and metabolites        300 Cocaine and metabolites        300 THC                            50 Performed at [facility], [place] [address], [place], [place] [address]    WBC 08/10/2021 7.1  4.0 - 10.5 K/uL Final   RBC 08/10/2021 3.86 (A)  3.87 - 5.11 MIL/uL Final   Hemoglobin 08/10/2021 10.7 (A)  12.0 - 15.0 g/dL Final   HCT 08/10/2021 34.0 (A)  36.0 - 46.0 % Final   MCV 08/10/2021 88.1  80.0 - 100.0 fL Final   MCH 08/10/2021 27.7  26.0 - 34.0 pg Final   MCHC 08/10/2021 31.5   30.0 - 36.0 g/dL Final   RDW 08/10/2021 14.3  11.5 - 15.5 % Final   Platelets 08/10/2021 255  150 - 400 K/uL Final   nRBC 08/10/2021 0.0  0.0 - 0.2 % Final   Neutrophils Relative % 08/10/2021 61  % Final   Neutro Abs 08/10/2021 4.3  1.7 - 7.7 K/uL Final   Lymphocytes Relative 08/10/2021 30  % Final   Lymphs Abs 08/10/2021 2.1  0.7 - 4.0 K/uL Final   Monocytes Relative 08/10/2021 7  % Final   Monocytes Absolute 08/10/2021 0.5  0.1 - 1.0 K/uL Final   Eosinophils Relative 08/10/2021 1  % Final   Eosinophils Absolute 08/10/2021 0.0  0.0 - 0.5 K/uL Final   Basophils Relative 08/10/2021 0  % Final   Basophils Absolute 08/10/2021 0.0  0.0 - 0.1 K/uL Final   Immature Granulocytes 08/10/2021 1  % Final   Abs Immature Granulocytes 08/10/2021 0.07  0.00 - 0.07 K/uL Final   Performed at [facility], [place] [address], [place], [place] [address]  Admission on 07/16/2021, Discharged on 07/18/2021  Component Date Value Ref Range Status   WBC 07/16/2021 9.2  4.0 - 10.5 K/uL Final   RBC 07/16/2021 4.92  3.87 - 5.11  MIL/uL Final   Hemoglobin 07/16/2021 13.7  12.0 - 15.0 g/dL Final   HCT 07/16/2021 41.7  36.0 - 46.0 % Final   MCV 07/16/2021 84.8  80.0 - 100.0 fL Final   MCH 07/16/2021 27.8  26.0 - 34.0 pg Final   MCHC 07/16/2021 32.9  30.0 - 36.0 g/dL Final   RDW 07/16/2021 14.3  11.5 - 15.5 % Final   Platelets 07/16/2021 375  150 - 400 K/uL Final   nRBC 07/16/2021 0.0  0.0 - 0.2 % Final   Neutrophils Relative % 07/16/2021 67  % Final   Neutro Abs 07/16/2021 6.2  1.7 - 7.7 K/uL Final   Lymphocytes Relative 07/16/2021 24  % Final   Lymphs Abs 07/16/2021 2.2  0.7 - 4.0 K/uL Final   Monocytes Relative 07/16/2021 7  % Final   Monocytes Absolute 07/16/2021 0.6  0.1 - 1.0 K/uL Final   Eosinophils Relative 07/16/2021 1  % Final   Eosinophils Absolute 07/16/2021 0.1  0.0 - 0.5 K/uL Final   Basophils Relative 07/16/2021 1  % Final   Basophils Absolute 07/16/2021 0.1  0.0 - 0.1 K/uL Final   Immature  Granulocytes 07/16/2021 0  % Final   Abs Immature Granulocytes 07/16/2021 0.03  0.00 - 0.07 K/uL Final   Performed at [facility] [place] [address], [place], [place] [address]   Sodium 07/16/2021 139  135 - 145 mmol/L Final   Potassium 07/16/2021 3.1 (A)  3.5 - 5.1 mmol/L Final   Chloride 07/16/2021 103  98 - 111 mmol/L Final   CO2 07/16/2021 24  22 - 32 mmol/L Final   Glucose, Bld 07/16/2021 116 (A)  70 - 99 mg/dL Final   Glucose reference range applies only to samples taken after fasting for at least 8 hours.   BUN 07/16/2021 19  8 - 23 mg/dL Final   Creatinine, Ser 07/16/2021 0.91  0.44 - 1.00 mg/dL Final   Calcium 07/16/2021 10.6 (A)  8.9 - 10.3 mg/dL Final   Total Protein 07/16/2021 8.4 (A)  6.5 - 8.1 g/dL Final   Albumin 07/16/2021 4.8  3.5 - 5.0 g/dL Final   AST 07/16/2021 31  15 - 41 U/L Final   ALT 07/16/2021 34  0 - 44 U/L Final   Alkaline Phosphatase 07/16/2021 135 (A)  38 - 126 U/L Final   Total Bilirubin 07/16/2021 1.2  0.3 - 1.2 mg/dL Final   GFR, Estimated 07/16/2021 >60  >60 mL/min Final   Comment: (NOTE) Calculated using the CKD-EPI Creatinine Equation (2021)    Anion gap 07/16/2021 12  5 - 15 Final   Performed at [facility], [place] [address], [place], [place] [address]   Alcohol, Ethyl (B) 07/16/2021 <10  <10 mg/dL Final   Comment: (NOTE) Lowest detectable limit for serum alcohol is 10 mg/dL.  For medical purposes only. Performed at [facility], [place] [address] [place], [place] [id number]    Salicylate Lvl [id number] <7.0 (A)  7.0 - 30.0 mg/dL Final   Performed at [place] [address], [place], [place] [address]   Acetaminophen (Tylenol), Serum 07/16/2021 11  10 - 30 ug/mL Final   Comment: (NOTE) Therapeutic concentrations vary significantly. A range of 10-30 ug/mL  may be an effective concentration for many patients. However, some  are best treated at concentrations outside of  this range. Acetaminophen concentrations >150 ug/mL at 4 hours after ingestion  and >50 ug/mL at 12 hours after ingestion  are often associated with  toxic reactions.  Performed at [facility], [place] [address], [place], [place] [address]    Opiates 07/16/2021 NONE DETECTED  NONE DETECTED Final   Cocaine 07/16/2021 NONE DETECTED  NONE DETECTED Final   Benzodiazepines 07/16/2021 NONE DETECTED  NONE DETECTED Final   Amphetamines 07/16/2021 NONE DETECTED  NONE DETECTED Final   Tetrahydrocannabinol 07/16/2021 NONE DETECTED  NONE DETECTED Final   Barbiturates 07/16/2021 NONE DETECTED  NONE DETECTED Final   Comment: (NOTE) DRUG SCREEN FOR MEDICAL PURPOSES ONLY.  IF CONFIRMATION IS NEEDED FOR ANY PURPOSE, NOTIFY LAB WITHIN 5 DAYS.  LOWEST DETECTABLE LIMITS FOR URINE DRUG SCREEN Drug Class                     Cutoff (ng/mL) Amphetamine and metabolites    1000 Barbiturate and metabolites    200 Benzodiazepine                 [id number] Tricyclics and metabolites     300 Opiates and metabolites        300 Cocaine and metabolites        300 THC                            50 Performed at [facility], [place] [address], [place], [place] [address]    Color, Urine 07/16/2021 YELLOW  YELLOW Final   APPearance 07/16/2021 CLEAR  CLEAR Final   Specific Gravity, Urine 07/16/2021 1.029  1.005 - 1.030 Final   pH 07/16/2021 5.0  5.0 - 8.0 Final   Glucose, UA 07/16/2021 NEGATIVE  NEGATIVE mg/dL Final   Hgb urine dipstick 07/16/2021 NEGATIVE  NEGATIVE Final   Bilirubin Urine 07/16/2021 NEGATIVE  NEGATIVE Final   Ketones, ur 07/16/2021 5 (A)  NEGATIVE mg/dL Final   Protein, ur 07/16/2021 100 (A)  NEGATIVE mg/dL Final   Nitrite 07/16/2021 NEGATIVE  NEGATIVE Final   Leukocytes,Ua 07/16/2021 LARGE (A)  NEGATIVE Final   RBC / HPF 07/16/2021 0-5  0 - 5 RBC/hpf Final   WBC, UA 07/16/2021 0-5  0 - 5 WBC/hpf Final   Bacteria, UA 07/16/2021 NONE SEEN  NONE SEEN Final   Squamous  Epithelial / LPF 07/16/2021 0-5  0 - 5 Final   Mucus 07/16/2021 PRESENT   Final   Hyaline Casts, UA 07/16/2021 PRESENT   Final   Performed at [facility], [place] [address], [place], [place] [address]   TSH 07/16/2021 0.304 (A)  0.350 - 4.500 uIU/mL Final   Comment: Performed by a 3rd Generation assay with a functional sensitivity of <=0.01 uIU/mL. Performed at [facility], [place] [address], [place], [place] [address]    SARS Coronavirus 2 by RT PCR 07/16/2021 NEGATIVE  NEGATIVE Final   Comment: (NOTE) SARS-CoV-2 target nucleic acids are NOT DETECTED.  The SARS-CoV-2 RNA is generally detectable in upper respiratory specimens during the acute phase of infection. The lowest concentration of SARS-CoV-2 viral copies this assay can detect is 138 copies/mL. A negative result does not preclude SARS-Cov-2 infection and should not be used as the sole basis for treatment or other patient management decisions. A negative result may occur with  improper specimen collection/handling, submission of specimen other than nasopharyngeal swab, presence of viral mutation(s) within the areas targeted by this assay, and inadequate number of viral copies(<138 copies/mL). A negative result must be combined with clinical observations, patient history, and  epidemiological information. The expected result is Negative.  Fact Sheet for Patients:  EntrepreneurPulse.com.au  Fact Sheet for Healthcare Providers:  IncredibleEmployment.be  This test is no                          t yet approved or cleared by the [place] FDA and  has been authorized for detection and/or diagnosis of SARS-CoV-2 by FDA under an Emergency Use Authorization (EUA). This EUA will remain  in effect (meaning this test can be used) for the duration of the COVID-19 declaration under Section 564(b)(1) of the Act, 21 U.S.C.section 360bbb-3(b)(1), unless the  authorization is terminated  or revoked sooner.       Influenza A by PCR 07/16/2021 NEGATIVE  NEGATIVE Final   Influenza B by PCR 07/16/2021 NEGATIVE  NEGATIVE Final   Comment: (NOTE) The Xpert Xpress SARS-CoV-2/FLU/RSV plus assay is intended as an aid in the diagnosis of influenza from Nasopharyngeal swab specimens and should not be used as a sole basis for treatment. Nasal washings and aspirates are unacceptable for Xpert Xpress SARS-CoV-2/FLU/RSV testing.  Fact Sheet for Patients: EntrepreneurPulse.com.au  Fact Sheet for Healthcare Providers: IncredibleEmployment.be  This test is not yet approved or cleared by the [place] FDA and has been authorized for detection and/or diagnosis of SARS-CoV-2 by FDA under an Emergency Use Authorization (EUA). This EUA will remain in effect (meaning this test can be used) for the duration of the COVID-19 declaration under Section 564(b)(1) of the Act, 21 U.S.C. section 360bbb-3(b)(1), unless the authorization is terminated or revoked.  Performed at [facility], [place] [address] [place], [place] [address]    Troponin I (High Sensitivity) 07/16/2021 11  <18 ng/L Final   Comment: (NOTE) Elevated high sensitivity troponin I (hsTnI) values and significant  changes across serial measurements may suggest ACS but many other  chronic and acute conditions are known to elevate hsTnI results.  Refer to the "Links" section for chest pain algorithms and additional  guidance. Performed at [facility], [place] [address] [place], [place] [address]     Allergies: Aspirin, Chocolate, Cocoa, Penicillins, and Sulfa antibiotics  PTA Medications:  Current Outpatient Medications  Medication Instructions   atenolol (TENORMIN) 12.5 mg, Oral, Daily   atorvastatin (LIPITOR) 40 MG tablet 1 tablet, Oral, Daily   divalproex (DEPAKOTE ER) 750 mg, Oral, Daily at bedtime   ibuprofen  (ADVIL) 800 mg, Oral, 3 times daily   lisinopril (ZESTRIL) 10 mg, Oral, Daily   methimazole (TAPAZOLE) 10 MG tablet 1 tablet, Oral, Daily   pantoprazole (PROTONIX) 40 mg, Oral, 2 times daily   risperiDONE (RISPERDAL) 0.5 mg, Oral, Daily at bedtime   risperiDONE (RISPERDAL) 1 mg, Oral, Daily at bedtime      Medical Decision Making  Psychosis, 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 Patient is not suicidal or homicidal although judgement is impaired given current mental state patient is at risk for impaired safety.   Restart depakote at 500 mg QHS for mood stability. VA level <10. Will give depakote 250 mg x 1 dose in the morning.   Restart risperdal 1 mg QHS for mood stability.  Scheduled Meds:  atenolol  12.5 mg Oral Daily   atorvastatin  40 mg Oral Daily   divalproex  500 mg Oral QHS   divalproex  250 mg Oral Once   lisinopril  10 mg Oral Daily  methimazole  10 mg Oral Daily   pantoprazole  40 mg Oral BID   risperiDONE  0.25 mg Oral Once   risperiDONE  1 mg Oral QHS    Meds ordered this encounter  Medications   acetaminophen (TYLENOL) tablet 650 mg   alum & mag hydroxide-simeth (MAALOX/MYLANTA) 200-200-20 MG/5ML suspension 30 mL   magnesium hydroxide (MILK OF MAGNESIA) suspension 30 mL   hydrOXYzine (ATARAX/VISTARIL) tablet 25 mg   traZODone (DESYREL) tablet 50 mg   atenolol (TENORMIN) tablet 12.5 mg   atorvastatin (LIPITOR) tablet 40 mg   divalproex (DEPAKOTE ER) 24 hr tablet 500 mg   lisinopril (ZESTRIL) tablet 10 mg   methimazole (TAPAZOLE) tablet 10 mg   pantoprazole (PROTONIX) EC tablet 40 mg   risperiDONE (RISPERDAL) tablet 1 mg   risperiDONE (RISPERDAL) tablet 0.25 mg   divalproex (DEPAKOTE) DR tablet 250 mg    Lab Orders         Resp Panel by RT-PCR (Flu A&B, Covid) Nasopharyngeal Swab         CBC with Differential/Platelet         Comprehensive metabolic panel          Hemoglobin A1c         Ethanol         TSH         Valproic acid level         Urinalysis, Routine w reflex microscopic         Lipid panel         POC SARS Coronavirus 2 Ag-ED - Nasal Swab         POCT Urine Drug Screen - (ICup)      Clinical Course as of 10/07/21 0434  Thu Oct 07, 2021  0350 Urinalysis, Routine w reflex microscopic Urine, Clean Catch(!) Source is likely contaminated. Nitrite negative. Protein negative. Rare bacteria.  [JB]  0429 CBC with Differential/Platelet CBC unremarkable. [JB]  0429 Comprehensive metabolic panel(!) Glucose 108-CMP otherwise unremarkable [JB]  0429 Hemoglobin A1C: 5.6 [JB]  0429 Alcohol, Ethyl (B): <10 [JB]  0429 Cholesterol(!): 253 Patient prescribed lipitor [JB]  0430 POCT Urine Drug Screen - (ICup) UDS negative [JB]    Clinical Course User Index [JB] Rozetta Nunnery, NP     Recommendations  Based on my evaluation the patient does not appear to have an emergency medical condition. Admit to crisis observation for medication stabilization as patient is actively delusional and paranoid and feels  Unsafe as she is believes someone is trying to harm her. Continue home medications.  Routine labs pending. Patient is here voluntarily and consents to treatment plan.   Rozetta Nunnery, NP 10/07/21  4:34 AM

## 2021-10-07 NOTE — Progress Notes (Signed)
Pt was brought in by [employer].  Recently di/c'ed from a facility in [place].  Pt has paranoid delusions that she is being shot at when she goes outsid.  Pt is voluntary.  Pt has run out of the meds she was prescribed at discharge.  Pt has been out of meds for a week.  Pt reports normal sleep.  Pt says that she is not eating well because sister is only feeding her peanut butter crackers.  Pt is staying with her sister.
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
## 2021-10-08 ENCOUNTER — Encounter (HOSPITAL_COMMUNITY): Payer: Self-pay

## 2021-10-08 DIAGNOSIS — F23 Brief psychotic disorder: Secondary | ICD-10-CM | POA: Diagnosis not present

## 2021-10-08 MED ORDER — RISPERIDONE 2 MG PO TABS
2.0000 mg | ORAL_TABLET | Freq: Every day | ORAL | Status: DC
  Administered 2021-10-08: 2 mg via ORAL
  Filled 2021-10-08 (×4): qty 1

## 2021-10-08 MED ORDER — IBUPROFEN 600 MG PO TABS
600.0000 mg | ORAL_TABLET | Freq: Three times a day (TID) | ORAL | Status: DC | PRN
  Administered 2021-10-09 – 2021-10-26 (×22): 600 mg via ORAL
  Filled 2021-10-08 (×23): qty 1

## 2021-10-08 MED ORDER — CIPROFLOXACIN HCL 250 MG PO TABS
250.0000 mg | ORAL_TABLET | Freq: Two times a day (BID) | ORAL | Status: AC
  Filled 2021-10-08 (×5): qty 1

## 2021-10-08 NOTE — BHH Group Notes (Signed)
Pt. Did not attend Octaviano Glow.

## 2021-10-08 NOTE — BHH Suicide Risk Assessment (Signed)
BHH INPATIENT:  Family/Significant Other Suicide Prevention Education  Suicide Prevention Education:  Patient Refusal for Family/Significant Other Suicide Prevention Education: The patient Shari [name] has refused to provide written consent for family/significant other to be provided Family/Significant Other Suicide Prevention Education during admission and/or prior to discharge.  Physician notified.  [name] 10/08/2021, 11:56 AM

## 2021-10-08 NOTE — Progress Notes (Signed)
Pt didn't attend therapeutic relaxation group. 
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
## 2021-10-08 NOTE — Progress Notes (Signed)
Patient's 10/08/21 EKG reviewed with Dr. Madilyn [facility]) via phone, who confirmed that patient's 10/08/21 EKG is similar to previous EKGs and shows no acute/concerning findings. Patient is asymptomatic at this time.

## 2021-10-08 NOTE — Group Note (Signed)
Recreation Therapy Group Note   Group Topic:Other  Group Date: 10/08/2021 Start Time: 1010 End Time: 1035 Facilitators: Caroll Rancher, LRT/CTRS Location: [address] Dayroom   Goal Area(s) Addresses:  Patient will identify positive stress management techniques. Patient will identify benefits of using stress management post d/c.  Group Description:  Meditation.  LRT played a meditation that focused on forgiveness of self and how it can be hard to show grace to oneself after a bad decision or not accomplishing a goal.  Patients were to listen and follow along with the meditation as it played to engage in activity.  LRT also gave patients options on platforms to use other stress management techniques such as Youtube, Apps and the Internet.     Affect/Mood: N/A   Participation Level: Did not attend    Clinical Observations/Individualized Feedback: Pt did not attend group.    Plan: Continue to engage patient in RT group sessions 2-3x/week.   Caroll Rancher, LRT/CTRS 10/08/2021 12:09 PM

## 2021-10-08 NOTE — Progress Notes (Signed)
Dar Note: Patient presents with a calm affect and depressed mood.  Denies suicidal thoughts, auditory and visual hallucinations.  Medications given as prescribed.  Patient is withdrawn and isolates to her room this shift.  Routine safety checks maintained.  Support and encouragement offered as needed.  Patient is safe on the unit.

## 2021-10-08 NOTE — Progress Notes (Signed)
Recreation Therapy Notes  INPATIENT RECREATION THERAPY ASSESSMENT  Patient Details Name: Shari [name] MRN: [id number] DOB: 06-26-1956 Today's Date: 10/08/2021       Information Obtained From: Patient  Able to Participate in Assessment/Interview: Yes  Patient Presentation: Alert, Withdrawn  Reason for Admission (Per Patient): Other (Comments) (Pt stated her sister signed her in here.)  Patient Stressors: Family (Sister)  Coping Skills:   Sports, TV, Music, Meditate, Deep Breathing, Prayer, Avoidance, Read, Hot Bath/Shower  Leisure Interests (2+):  Individual - Other (Comment) ("I haven't been doing anything")  Frequency of Recreation/Participation:    Awareness of Community Resources:  No  [place] of Residence:  [place]  Patient Main Form of Transportation: Taxi (Friends; Family used to)  Patient Strengths:  Helping others; Cleaning; Cooking  Patient Identified Areas of Improvement:  Getting back to my own place; Being independent  Patient Goal for Hospitalization:  "to get better or whatever I'm here for"  Current SI (including self-harm):  No  Current HI:  No  Current AVH: No  Staff Intervention Plan: Group Attendance, Collaborate with Interdisciplinary Treatment Team  Consent to Intern Participation: N/A    Caroll Rancher, LRT/CTRS Caroll Rancher A 10/08/2021, 1:24 PM

## 2021-10-08 NOTE — BHH Group Notes (Signed)
Patient did not attend group. ° ° °Spirituality group facilitated by Chaplain Katy Jannelly Bergren, BCC.  ° °Group Description: Group focused on topic of hope. Patients participated in facilitated discussion around topic, connecting with one another around experiences and definitions for hope. Group members engaged with visual explorer photos, reflecting on what hope looks like for them today. Group engaged in discussion around how their definitions of hope are present today in hospital.  ° °Modalities: Psycho-social ed, Adlerian, Narrative, MI  ° ° °
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
## 2021-10-08 NOTE — BH IP Treatment Plan (Unsigned)
Interdisciplinary Treatment and Diagnostic Plan Update  10/08/2021 Time of Session:  Shari [name] MRN: [id number]  Principal Diagnosis: <principal problem not specified>  Secondary Diagnoses: Active Problems:   Acute psychosis ([place])   Current Medications:  Current Facility-Administered Medications  Medication Dose Route Frequency Provider Last Rate Last Admin   acetaminophen (TYLENOL) tablet 650 mg  650 mg Oral Q6H PRN Derrill Center, NP       alum & mag hydroxide-simeth (MAALOX/MYLANTA) 200-200-20 MG/5ML suspension 30 mL  30 mL Oral Q4H PRN Derrill Center, NP       atenolol (TENORMIN) tablet 12.5 mg  12.5 mg Oral Daily Derrill Center, NP   12.5 mg at 10/08/21 1052   atorvastatin (LIPITOR) tablet 40 mg  40 mg Oral Daily Derrill Center, NP   40 mg at 10/08/21 0825   divalproex (DEPAKOTE) DR tablet 500 mg  500 mg Oral QHS Derrill Center, NP   500 mg at 10/07/21 2102   feeding supplement (ENSURE ENLIVE / ENSURE PLUS) liquid 237 mL  237 mL Oral BID BM Hill, Jackie Plum, MD   237 mL at 10/08/21 1444   hydrOXYzine (ATARAX/VISTARIL) tablet 25 mg  25 mg Oral TID PRN Derrill Center, NP       lisinopril (ZESTRIL) tablet 10 mg  10 mg Oral Daily Derrill Center, NP   10 mg at 10/08/21 1052   magnesium hydroxide (MILK OF MAGNESIA) suspension 30 mL  30 mL Oral Daily PRN Derrill Center, NP       risperiDONE (RISPERDAL) tablet 1 mg  1 mg Oral QHS Derrill Center, NP   1 mg at 10/07/21 2106   traZODone (DESYREL) tablet 50 mg  50 mg Oral QHS PRN Derrill Center, NP   50 mg at 10/07/21 2103   PTA Medications: Medications Prior to Admission  Medication Sig Dispense Refill Last Dose   atenolol (TENORMIN) 25 MG tablet Take 0.5 tablets (12.5 mg total) by mouth daily.      atorvastatin (LIPITOR) 40 MG tablet Take 40 mg by mouth daily.      ciprofloxacin (CIPRO) 500 MG tablet Take 1 tablet (500 mg total) by mouth 2 (two) times daily.      divalproex (DEPAKOTE ER) 250 MG 24 hr tablet Take 750 mg by  mouth at bedtime.      ibuprofen (ADVIL) 800 MG tablet Take 800 mg by mouth 3 (three) times daily.      lisinopril (PRINIVIL,ZESTRIL) 10 MG tablet Take 10 mg by mouth daily.       methimazole (TAPAZOLE) 10 MG tablet Take 10 mg by mouth daily.      pantoprazole (PROTONIX) 40 MG tablet Take 40 mg by mouth 2 (two) times daily.       risperiDONE (RISPERDAL) 1 MG tablet Take 1 tablet (1 mg total) by mouth at bedtime.      traZODone (DESYREL) 50 MG tablet Take 1 tablet (50 mg total) by mouth at bedtime as needed for sleep.       Patient Stressors: Marital or family conflict   Medication change or noncompliance    Patient Strengths: Physical Health  Religious Affiliation  Supportive family/friends   Treatment Modalities: Medication Management, Group therapy, Case management,  1 to 1 session with clinician, Psychoeducation, Recreational therapy.   Physician Treatment Plan for Primary Diagnosis: <principal problem not specified> Long Term Goal(s):     Short Term Goals:    Medication Management: Evaluate patient's response,  side effects, and tolerance of medication regimen.  Therapeutic Interventions: 1 to 1 sessions, Unit Group sessions and Medication administration.  Evaluation of Outcomes: Not Met  Physician Treatment Plan for Secondary Diagnosis: Active Problems:   Acute psychosis ([place])  Long Term Goal(s):     Short Term Goals:       Medication Management: Evaluate patient's response, side effects, and tolerance of medication regimen.  Therapeutic Interventions: 1 to 1 sessions, Unit Group sessions and Medication administration.  Evaluation of Outcomes: Not Met   RN Treatment Plan for Primary Diagnosis: <principal problem not specified> Long Term Goal(s): Knowledge of disease and therapeutic regimen to maintain health will improve  Short Term Goals: Ability to demonstrate self-control, Ability to participate in decision making will improve, and Ability to verbalize feelings  will improve  Medication Management: RN will administer medications as ordered by provider, will assess and evaluate patient's response and provide education to patient for prescribed medication. RN will report any adverse and/or side effects to prescribing provider.  Therapeutic Interventions: 1 on 1 counseling sessions, Psychoeducation, Medication administration, Evaluate responses to treatment, Monitor vital signs and CBGs as ordered, Perform/monitor CIWA, COWS, AIMS and Fall Risk screenings as ordered, Perform wound care treatments as ordered.  Evaluation of Outcomes: Not Met   LCSW Treatment Plan for Primary Diagnosis: <principal problem not specified> Long Term Goal(s): Safe transition to appropriate next level of care at discharge, Engage patient in therapeutic group addressing interpersonal concerns.  Short Term Goals: Engage patient in aftercare planning with referrals and resources, Increase social support, and Increase emotional regulation  Therapeutic Interventions: Assess for all discharge needs, 1 to 1 time with Social worker, Explore available resources and support systems, Assess for adequacy in community support network, Educate family and significant other(s) on suicide prevention, Complete Psychosocial Assessment, Interpersonal group therapy.  Evaluation of Outcomes: Not Met   Progress in Treatment: Attending groups: No. Participating in groups: No. Taking medication as prescribed: Yes. Toleration medication: Yes. Family/Significant other contact made: Yes, individual(s) contacted:  declined Patient understands diagnosis: Yes. Discussing patient identified problems/goals with staff: Yes. Medical problems stabilized or resolved: Yes. Denies suicidal/homicidal ideation: Yes. Issues/concerns per patient self-inventory: No. Other: None  New problem(s) identified: No, Describe:  None  New Short Term/Long Term Goal(s):medication stabilization, elimination of SI  thoughts, development of comprehensive mental wellness plan.   Patient Goals:  "keep feeling better"  Discharge Plan or Barriers: Patient recently admitted. CSW will continue to follow and assess for appropriate referrals and possible discharge planning.   Reason for Continuation of Hospitalization: Delusions  Medication stabilization Suicidal ideation  Estimated Length of Stay: 3-5 days   Scribe for Treatment Team: Eliott Nine 10/08/2021 2:47 PM
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
## 2021-10-08 NOTE — Progress Notes (Signed)
Adult Psychoeducational Group Note  Date:  10/08/2021 Time:  8:13 PM  Group Topic/Focus:  Wrap-Up Group:   The focus of this group is to help patients review their daily goal of treatment and discuss progress on daily workbooks.  Participation Level:  Did Not Attend  Participation Quality:   Did Not Attend  Affect:   Did Not Attend  Cognitive:   Did Not Attend  Insight: None  Engagement in Group:   Did Not Attend  Modes of Intervention:   Did Not Attend  Additional Comments:  Pt did not attend evening wrap up group tonight.  Felipa Furnace 10/08/2021, 8:13 PM

## 2021-10-08 NOTE — Group Note (Signed)
LCSW Group Therapy Notes  Type of Therapy and Topic: Group Therapy: Healthy Vs. Unhealthy Coping Strategies  Date and Time: 10/08/2021 at 11:00am  Participation Level: [facility] PARTICIPATION LEVEL: Did Not Attend  Description of Group:  In this group, patients will be encouraged to explore their healthy and unhealthy coping strategics. Coping strategies are actions that we take to deal with stress, problems, or uncomfortable emotions in our daily lives. Each patient will be challenged to read some scenarios and discuss the unhealthy and healthy coping strategies within those scenarios. Also, each patient will be challenged to describe current healthy and unhealthy strategies that they use in their own lives and discuss the outcomes and barriers to those strategies. This group will be process-oriented, with patients participating in exploration of their own experiences as well as giving and receiving support and challenge from other group members.  Therapeutic Goals: Patient will identify personal healthy and unhealthy coping strategies. Patient will identify healthy and unhealthy coping strategies, in others, through scenarios.  Patient will identify expected outcomes of healthy and unhealthy coping strategies. Patient will identify barriers to using healthy coping strategies.   Summary of Patient Progress: Pt sleeping, did not attend.    Therapeutic Modalities:  Cognitive Behavioral Therapy Solution Focused Therapy Motivational Interviewing   Ruthann Cancer MSW, LCSW Clincal Social Worker  [facility]

## 2021-10-08 NOTE — Progress Notes (Signed)
   10/08/21 2047  Psych Admission Type (Psych Patients Only)  Admission Status Involuntary  Psychosocial Assessment  Patient Complaints None  Eye Contact Suspiciousness;Watchful  Facial Expression Anxious  Affect Anxious  Speech Unremarkable  Interaction Assertive  Motor Activity Other (Comment) (wnl)  Appearance/Hygiene Unremarkable  Behavior Characteristics Cooperative  Mood Anxious;Pleasant  Thought Process  Coherency WDL  Content WDL;Paranoia  Delusions None reported or observed  Perception Hallucinations  Hallucination None reported or observed  Judgment Impaired  Confusion None  Danger to Self  Current suicidal ideation? Denies  Danger to Others  Danger to Others None reported or observed   Pt seen in hallway. Pt denies SI, HI, AVH. Rates pain in her left leg at 7/10. States she has sciatic nerve pain that runs down her leg. Offered Tylenol. Pt refused. "That does nothing for me. I take ibuprofen at home. I have some (in my locker)." Will check with provider. Pt refused antibiotic. "I don't want that. Nobody told me about that." Pt encouraged to speak with provider in the morning.

## 2021-10-09 LAB — URINALYSIS, COMPLETE (UACMP) WITH MICROSCOPIC
Bacteria, UA: NONE SEEN
Bilirubin Urine: NEGATIVE
Glucose, UA: NEGATIVE mg/dL
Hgb urine dipstick: NEGATIVE
Ketones, ur: 20 mg/dL — AB
Nitrite: NEGATIVE
Protein, ur: NEGATIVE mg/dL
Specific Gravity, Urine: 1.014 (ref 1.005–1.030)
pH: 5 (ref 5.0–8.0)

## 2021-10-09 MED ORDER — METHIMAZOLE 10 MG PO TABS
10.0000 mg | ORAL_TABLET | Freq: Every day | ORAL | Status: DC
  Administered 2021-10-09 – 2021-10-26 (×17): 10 mg via ORAL
  Filled 2021-10-09 (×7): qty 1
  Filled 2021-10-09: qty 7
  Filled 2021-10-09 (×14): qty 1

## 2021-10-09 MED ORDER — ATENOLOL 12.5 MG HALF TABLET
12.5000 mg | ORAL_TABLET | Freq: Every day | ORAL | Status: DC
  Administered 2021-10-09 – 2021-10-26 (×18): 12.5 mg via ORAL
  Filled 2021-10-09 (×12): qty 1
  Filled 2021-10-09: qty 4
  Filled 2021-10-09 (×7): qty 1

## 2021-10-09 MED ORDER — RISPERIDONE 3 MG PO TABS
3.0000 mg | ORAL_TABLET | Freq: Every day | ORAL | Status: DC
  Filled 2021-10-09: qty 1

## 2021-10-09 MED ORDER — RISPERIDONE 2 MG PO TABS
2.0000 mg | ORAL_TABLET | Freq: Every day | ORAL | Status: DC
  Administered 2021-10-09 – 2021-10-19 (×11): 2 mg via ORAL
  Filled 2021-10-09 (×15): qty 1

## 2021-10-09 NOTE — Progress Notes (Signed)
Pt didn't attend psycho-ed group.  

## 2021-10-09 NOTE — Progress Notes (Signed)
Adult Psychoeducational Group Note  Date:  10/09/2021 Time:  8:41 PM  Group Topic/Focus:  Wrap-Up Group:   The focus of this group is to help patients review their daily goal of treatment and discuss progress on daily workbooks.  Participation Level:  Minimal  Participation Quality:  Drowsy  Affect:  Anxious and Depressed  Cognitive:  Disorganized and Confused  Insight: Limited  Engagement in Group:  Limited  Modes of Intervention:  Discussion  Additional Comments:  Pt stated her goal for today was to focus on her treatment plan. Pt stated she accomplished her goal today. Pt stated she talked with her doctor and her social worker about her care today. Pt rated her overall day a 5 out of 10. Pt stated she interacted  with her peers more instead of isolating to herself to her room. Pt stated she made no calls today. Pt stated she felt better about herself tonight. Pt stated she was able to attend all meals. Pt stated she took all medications provided today. Pt stated her appetite was pretty good today. Pt rated her sleep last night was pretty good. Pt stated the goal tonight was to get some rest. Pt stated she had some physical pain tonight. Pt states she had some moderate pain in her left leg. Pt rated the moderate pain in her left leg a 5 on the pain levels scale. Pt nurse was updated on situation. Pt deny visual hallucinations and auditory issues tonight.  Pt denies thoughts of harming herself or others. Pt stated she would alert staff if anything changed.  Felipa Furnace 10/09/2021, 8:41 PM

## 2021-10-09 NOTE — Progress Notes (Signed)
Pt didn't attend orientation/goals group. 

## 2021-10-09 NOTE — Progress Notes (Addendum)
[facility] MD Progress Note  10/09/2021 1:44 PM Shari [name]  MRN:  [id number] Subjective:   The patient is a 65 year old female with a past psychiatric history of unspecified psychotic disorder presenting under IVC for paranoia and delusions. She believes that there are people in the woods near her house that have tried to shoot her.   The patient's chart was reviewed and nursing notes were reviewed. Over the past 24 hrs, there were no documented behavioral issues, no PRN medications given for agitation.The patient's case was discussed in multidisciplinary team meeting.   The care team made the following medication changes yesterday: -Increased Risperdal from 1 mg to 2 mg -Continue home Depakote 500 mg for mood stabilization  On interview this morning, the patient reports that she is doing well. She reports feeling safe on the unit and does not report any paranoia regarding staff. She does not believe there are people in the woods trying to shoot her. She does still believe those people are at her home right now.  She denies auditory/visual hallucinations and first rank symptoms.  She does not appear to be responding to internal stimuli.  She denies suicidal thoughts.  [name] reluctant to take her ciprofloxacin for her UTI, however, she agrees to this eventually.  She does ask about her antihypertensives and her methimazole.  It was explained that she has not given her antihypertensives because of her low blood pressure.  And she was not given her methimazole due to her not taking this medication prior to admission.   Principal Problem: Acute psychosis (HCC) Diagnosis: Principal Problem:   Acute psychosis (HCC)  Total Time Spent in Direct Patient Care: I personally spent 30 minutes on the unit in direct patient care. The direct patient care time included face-to-face time with the patient, reviewing the patient's chart, communicating with other professionals, and coordinating care. Greater than 50%  of this time was spent in counseling or coordinating care with the patient regarding goals of hospitalization, psycho-education, and discharge planning needs.   Past Psychiatric History: unspecified psychotic disorder  Past Medical History:  Past Medical History:  Diagnosis Date   Eye globe prosthesis    GERD (gastroesophageal reflux disease)    History of blood transfusion 1973   "related to abscess burst in my stomach"   Hyperlipemia    Hyperlipidemia    Hypertension    Osteoarthritis of back    Lowerback    SVD (spontaneous vaginal delivery)    x 1, baby died at 2 wks of age   Type II diabetes mellitus (HCC)     Past Surgical History:  Procedure Laterality Date   APPENDECTOMY     COLONOSCOPY     DILATATION & CURETTAGE/HYSTEROSCOPY WITH MYOSURE N/A 02/25/2015   Procedure: DILATATION & CURETTAGE/HYSTEROSCOPY WITH MYOSURE;  Surgeon: [name], MD;  Location: [facility];  Service: Gynecology;  Laterality: N/A;   DILATION AND CURETTAGE OF UTERUS     ENUCLEATION Right 03/16/2017   ENUCLEATION Right 03/16/2017   Procedure: ENUCLEATION RIGHT EYE;  Surgeon: [name], MD;  Location: [facility] OR;  Service: Ophthalmology;  Laterality: Right;   EYE SURGERY     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6, 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   EYE SURGERY Right ~ 1974   "S/P initial eye injury; scissors stuck in my eye""   LAPAROSCOPIC CHOLECYSTECTOMY     SHOULDER ARTHROSCOPY WITH ROTATOR CUFF REPAIR Left    wire stitches per patient   SHOULDER ARTHROSCOPY WITH ROTATOR CUFF  REPAIR Right    Family History:  Family History  Problem Relation Age of Onset   Diabetes Mother    CAD Mother    Lung cancer Father    Family Psychiatric  History: unknown Social History:  Social History   Substance and Sexual Activity  Alcohol Use Yes   Comment: 03/16/2017 "nothing since 2013"     Social History   Substance and Sexual Activity  Drug Use No    Social History   Socioeconomic History   Marital status: Widowed     Spouse name: Not on file   Number of children: Not on file   Years of education: Not on file   Highest education level: Not on file  Occupational History   Not on file  Tobacco Use   Smoking status: Former    Packs/day: 0.50    Years: 40.00    Pack years: 20.00    Types: Cigarettes    Quit date: 2016    Years since quitting: 6.8   Smokeless tobacco: Never  Vaping Use   Vaping Use: Never used  Substance and Sexual Activity   Alcohol use: Yes    Comment: 03/16/2017 "nothing since 2013"   Drug use: No   Sexual activity: Not Currently    Birth control/protection: Post-menopausal  Other Topics Concern   Not on file  Social History Narrative   Not on file   Social Determinants of Health   Financial Resource Strain: Not on file  Food Insecurity: Not on file  Transportation Needs: Not on file  Physical Activity: Not on file  Stress: Not on file  Social Connections: Not on file   Additional Social History:        Sleep: Fair  Appetite:  Fair  Current Medications: Current Facility-Administered Medications  Medication Dose Route Frequency Provider Last Rate Last Admin   acetaminophen (TYLENOL) tablet 650 mg  650 mg Oral Q6H PRN Derrill Center, NP       alum & mag hydroxide-simeth (MAALOX/MYLANTA) 200-200-20 MG/5ML suspension 30 mL  30 mL Oral Q4H PRN Derrill Center, NP       atorvastatin (LIPITOR) tablet 40 mg  40 mg Oral Daily Derrill Center, NP   40 mg at 10/09/21 [id number]   ciprofloxacin (CIPRO) tablet 250 mg  250 mg Oral BID Corky Sox, MD       divalproex (DEPAKOTE) DR tablet 500 mg  500 mg Oral QHS Derrill Center, NP   500 mg at 10/08/21 2051   feeding supplement (ENSURE ENLIVE / ENSURE PLUS) liquid 237 mL  237 mL Oral BID BM Walker Sitar, Jackie Plum, MD   237 mL at 10/09/21 [id number]   hydrOXYzine (ATARAX/VISTARIL) tablet 25 mg  25 mg Oral TID PRN Derrill Center, NP       ibuprofen (ADVIL) tablet 600 mg  600 mg Oral TID PRN Bobbitt, Shalon E, NP   600 mg at 10/09/21  0825   magnesium hydroxide (MILK OF MAGNESIA) suspension 30 mL  30 mL Oral Daily PRN Derrill Center, NP       risperiDONE (RISPERDAL) tablet 2 mg  2 mg Oral QHS Massengill, Nathan, MD   2 mg at 10/08/21 2052   traZODone (DESYREL) tablet 50 mg  50 mg Oral QHS PRN Derrill Center, NP   50 mg at 10/08/21 2052    Lab Results:  Results for orders placed or performed during the hospital encounter of 10/07/21 (from the past 48 hour(s))  Urinalysis, Complete w Microscopic Urine, Clean Catch     Status: Abnormal   Collection Time: 10/08/21  9:18 PM  Result Value Ref Range   Color, Urine YELLOW YELLOW   APPearance CLEAR CLEAR   Specific Gravity, Urine 1.014 1.005 - 1.030   pH 5.0 5.0 - 8.0   Glucose, UA NEGATIVE NEGATIVE mg/dL   Hgb urine dipstick NEGATIVE NEGATIVE   Bilirubin Urine NEGATIVE NEGATIVE   Ketones, ur 20 (A) NEGATIVE mg/dL   Protein, ur NEGATIVE NEGATIVE mg/dL   Nitrite NEGATIVE NEGATIVE   Leukocytes,Ua MODERATE (A) NEGATIVE   RBC / HPF 0-5 0 - 5 RBC/hpf   WBC, UA 6-10 0 - 5 WBC/hpf   Bacteria, UA NONE SEEN NONE SEEN   Squamous Epithelial / LPF 0-5 0 - 5    Comment: Performed at [facility], [place] [address] [place], [place] [address]    Blood Alcohol level:  Lab Results  Component Value Date   ETH <10 10/07/2021   ETH <10 [id number]    Metabolic Disorder Labs: Lab Results  Component Value Date   HGBA1C 5.6 10/07/2021   MPG 114.02 10/07/2021   No results found for: PROLACTIN Lab Results  Component Value Date   CHOL 253 (H) 10/07/2021   TRIG 99 10/07/2021   HDL 56 10/07/2021   CHOLHDL 4.5 10/07/2021   VLDL 20 10/07/2021   LDLCALC 177 (H) 10/07/2021    Physical Findings: AIMS: not yet assessed  Musculoskeletal: Strength & Muscle Tone: within normal limits Gait & Station:  not assessed Patient leans: NA  Psychiatric Specialty Exam:  Presentation  General Appearance: Disheveled (sitting still, with back straight against wall, sitting  on bench in bedroom)  Eye Contact:Fleeting  Speech:Normal Rate  Speech Volume:Normal  Handedness:No data recorded  Mood and Affect  Mood: "fine" Affect:Constricted   Thought Process  Thought Processes:-- (tangential)  Descriptions of Associations:Tangential  Orientation: oriented to person place, time and event  Thought Content:Delusions; Paranoid Ideation; Illogical; Tangential  History of Schizophrenia/Schizoaffective disorder:Yes  Duration of Psychotic Symptoms:Greater than six months  Hallucinations:Hallucinations: None  Ideas of Reference:Delusions; Paranoia  Suicidal Thoughts:Suicidal Thoughts: No  Homicidal Thoughts:Homicidal Thoughts: No   Sensorium  Memory:Immediate Fair; Recent Fair; Remote Fair  Judgment:Poor  Insight:Poor   Executive Functions  Concentration:Fair  Attention Span:Fair  Recall:Poor (1 of 3)  [place] (knows past 3 presidents only)  Language:Fair   Psychomotor Activity  Psychomotor Activity:Psychomotor Activity: Psychomotor Retardation   Assets  Assets:Communication Skills; Financial Resources/Insurance; Social Support; Desire for Improvement; Leisure Time; Physical Health   Sleep  Sleep: fair   Physical Exam: Physical Exam Vitals reviewed.  Constitutional:      Appearance: [name] not ill-appearing or toxic-appearing.  Pulmonary:     Effort: Pulmonary effort is normal.  Musculoskeletal:        General: Normal range of motion.  Neurological:     General: No focal deficit present.     Mental Status: [name] alert.   Review of Systems  Respiratory:  Negative for shortness of breath.   Cardiovascular:  Negative for chest pain.  Gastrointestinal:  Negative for nausea and vomiting.  Blood pressure (!) 106/48, pulse 66, temperature 97.7 F (36.5 C), temperature source Oral, resp. rate 16, height 5\' 3"  (1.6 m), weight 60.8 kg, SpO2 100 %. Body mass index is 23.74 kg/m.   Treatment Plan  Summary: Daily contact with patient to assess and evaluate symptoms and progress in treatment and Medication management  Assessment  Schizophrenia   Psychosis -Continue Risperdal at 2 mg for psychosis, slow titration given age of patient -Continue home Depakote 500 mg for mood stabilization             -Depakote level of 0 on admission Antipsychotic labs             EKG: sinus brady, some inverted T-waves, discussed with ED attending, Qtc of 427             Lipids: chol of 253, LDL 177 (collected at 2 AM)             A1C: 5.6 AIMS: not yet assessed   Medical Management Covid negative CMP: unremarkable CBC: unremarkable EtOH: <10 UDS: negative Upreg: NA TSH: 1.2 UA: leukocytes, negative nitrites   Potential UTI Patient denies symptoms, however, given UA and psychosis will continue to treat with: -Cipro 250 mg BID for 3 days  HTN Continue to hold home lisinopril  Hyperthyroidism Verified in endocrinology notes. Patient last filled her 30 d supply of methimazole on 7/6 and ran out one month ago. -Restart methimazole -Restart atenolol    Corky Sox, MD 10/09/2021, 1:44 PM

## 2021-10-09 NOTE — Progress Notes (Signed)
   10/09/21 0631  Vital Signs  Pulse Rate 61  BP (!) 112/50  BP Location Left Arm  BP Method Automatic  Patient Position (if appropriate) Sitting   Pt BP rechecked from earlier this morning. Diastolic still low. Pt denies lightheadeness, dizziness and any other symptoms. Pt given 12 oz Gatorade between readings.

## 2021-10-09 NOTE — Progress Notes (Signed)
   10/09/21 1007  Psych Admission Type (Psych Patients Only)  Admission Status Involuntary  Psychosocial Assessment  Patient Complaints Other (Comment) ("I need clothes to change into. That's why I'm in my room")  Eye Contact Suspiciousness;Watchful  Facial Expression Pensive;Worried  Affect Appropriate to circumstance  Administrator, sports Activity Other (Comment) (wnl)  Appearance/Hygiene Unremarkable  Behavior Characteristics Cooperative  Mood Preoccupied;Pleasant  Aggressive Behavior  Effect No apparent injury  Thought Process  Coherency Concrete thinking  Content Paranoia  Delusions Paranoid;Persecutory  Perception Hallucinations  Hallucination Auditory  Judgment Impaired  Confusion None  Danger to Self  Current suicidal ideation? Denies  Danger to Others  Danger to Others None reported or observed

## 2021-10-09 NOTE — Hospital Course (Addendum)
During the patient's hospitalization, patient had extensive initial psychiatric evaluation, and follow-up psychiatric evaluations every day.  Psychiatric diagnoses provided upon initial assessment: schizophrenia  Upon further evaluation the diagnoses were given as follows:  #Delusional disorder #UTI #Afib #hyperthyroidism #sciatica #GERD   Patient's psychiatric medications were adjusted on admission:  -Increase home Risperdal from 1 mg to 2 mg for psychosis -Continue home Depakote 500 mg for mood stabilization  During the hospitalization, other adjustments were made to the patient's psychiatric medication regimen:  -Increased Risperdal to 1 mg AM and 2 mg QHS  -Added Cogentin 1 mg BID for cogwheeling and rigidity noted on the R arm -[place] home Depakote 500 mg daily Depakote level of 0 on admission, 30 after 3 doses. The medication was not increased because the patient did not have significant irritability or mood lability.   Gradually, patient started adjusting to milieu.   Patient's care was discussed during the interdisciplinary team meeting every day during the hospitalization.  As the hospitalization progressed, the patient became less guarded and delusional. She began attending groups and integrated well into the milieu. By discharge she reported doubt regarding her previous delusion that there were people living in the woods near her sister's house who had shot her with a gun. She was seen by occupational therapy who assessed the patient for competence in ADLs and iADLs. These were found to be intact.   AIMS: 0 on 11/10, cogwheeling of R wrist noted, resolved on 11/12, re-emerged on 11/14, still present on 11/15 despite 0.5 mg Cogentin. Cogentin increased to 1 mg BID. On next assessment***  The patient denied having subjective side effects to prescribed psychiatric medication.  The patient reports their target psychiatric symptoms of delusions responded well to the psychiatric  medications, and the patient reports overall benefit other psychiatric hospitalization. Supportive psychotherapy was provided to the patient. The patient also participated in regular group therapy while admitted.   Labs were reviewed with the patient, and abnormal results were discussed with the patient. The patient had a UA with leukocytes. She denied dysuria or urinary frequency. She refused treatment for a UTI.  The patient denied having suicidal thoughts more than 48 hours prior to discharge.  Patient denies having homicidal thoughts.  Patient denies having auditory hallucinations.  Patient denies any visual hallucinations.  Patient denies having paranoid thoughts.  The patient is able to verbalize their individual safety plan to this provider.  It is recommended to the patient to continue psychiatric medications as prescribed, after discharge from the hospital.    It is recommended to the patient to follow up with your outpatient psychiatric provider and PCP.  Discussed with the patient, the impact of alcohol, drugs, tobacco have been there overall psychiatric and medical wellbeing, and total abstinence from substance use was recommended the patient.

## 2021-10-10 LAB — VALPROIC ACID LEVEL: Valproic Acid Lvl: 30 ug/mL — ABNORMAL LOW (ref 50.0–100.0)

## 2021-10-10 MED ORDER — PANTOPRAZOLE SODIUM 40 MG PO TBEC
40.0000 mg | DELAYED_RELEASE_TABLET | Freq: Two times a day (BID) | ORAL | Status: DC
  Administered 2021-10-11 – 2021-10-26 (×31): 40 mg via ORAL
  Filled 2021-10-10 (×43): qty 1

## 2021-10-10 NOTE — Progress Notes (Signed)
Patient has been isolative to her room other than to come to dayroom for snack and med window for her medications. She continues to refuse her antibiotic. Writer was able to convince her to have her clothes washed while she is asleep. [name] very pleasant but paranoid. Safety maintained with 15 min checks.

## 2021-10-10 NOTE — Progress Notes (Signed)
D: Patient continues to exhibit paranoid behavior. Staff gave her an ensure and she would not drink it because it had been opened. She was given one that had not been opened and she was agreeable to drinking it. She took her medications this am; however, refused her antibiotics. She asked why she was prescribed antibiotics and I informed her it was for UTI. She states, "I don't have a UTI and I'm not taking them."   A: Continue to monitor medication management and MD orders.  Safety checks completed every 15 minutes per protocol.  Offer support and encouragement as needed.  R: Patient is receptive to staff; she is withdrawn and isolative to her room.  10/10/21 1100  Psych Admission Type (Psych Patients Only)  Admission Status Involuntary  Psychosocial Assessment  Patient Complaints Irritability  Eye Contact Suspiciousness  Facial Expression Angry  Affect Labile  Speech Argumentative  Interaction Assertive  Motor Activity Slow  Appearance/Hygiene Unremarkable  Behavior Characteristics Unwilling to participate  Mood Preoccupied  Thought Process  Coherency Concrete thinking  Content Paranoia  Delusions Paranoid;Persecutory  Perception Hallucinations  Hallucination Auditory  Judgment Impaired  Confusion None  Danger to Self  Current suicidal ideation? Denies  Danger to Others  Danger to Others None reported or observed

## 2021-10-10 NOTE — Progress Notes (Addendum)
[facility] MD Progress Note  10/10/2021 6:02 PM Shari [name]  MRN:  [id number] Subjective:   The patient is a 65 year old female with a past psychiatric history of unspecified psychotic disorder presenting under IVC for paranoia and delusions. She believes that there are people in the woods near her house that have tried to shoot her.   The patient's chart was reviewed and nursing notes were reviewed. Over the past 24 hrs, there were no documented behavioral issues, no PRN medications given for agitation.The patient's case was discussed in multidisciplinary team meeting.   The care team made the following medication changes yesterday: -Continue Risperdal 2 mg qHS -Continue home Depakote 500 mg for mood stabilization  On interview this morning, the patient reports that she is doing well.  Said that she slept well, her appetite is good, her mood is all right.  Stated that she is here because her sister wrongly signed some papers to get her locked up.  Stated that her sister is just jealous of her and does not want her to be in her life anymore.  Patient also mentioned something about protective orders, however unsure if this is for the patient or against the patient.  Patient also had concerns of back pain, that she would like to have her ibuprofen 800 mg again.  Stated that because of the high dosage and possible side effects of GI bleeds.  That we will hold off on giving her ibuprofen 800 mg.  We will substituting for other analgesics to help relieve her pain.  Stated that her pain radiates down to the back of her leg, that is sharp and dull in nature, that makes back of her leg and foot tingly.   She continues to endorse delusions of people outside in the woods who want to harm her.  She denied SI/HI/AVH, first rank symptoms.  Principal Problem: Acute psychosis (HCC) Diagnosis: Principal Problem:   Acute psychosis (HCC)  Total Time Spent in Direct Patient Care: I personally spent 30 minutes on the  unit in direct patient care. The direct patient care time included face-to-face time with the patient, reviewing the patient's chart, communicating with other professionals, and coordinating care. Greater than 50% of this time was spent in counseling or coordinating care with the patient regarding goals of hospitalization, psycho-education, and discharge planning needs.   Past Psychiatric History: unspecified psychotic disorder  Past Medical History:  Past Medical History:  Diagnosis Date   Eye globe prosthesis    GERD (gastroesophageal reflux disease)    History of blood transfusion 1973   "related to abscess burst in my stomach"   Hyperlipemia    Hyperlipidemia    Hypertension    Osteoarthritis of back    Lowerback    SVD (spontaneous vaginal delivery)    x 1, baby died at 2 wks of age   Type II diabetes mellitus (HCC)     Past Surgical History:  Procedure Laterality Date   APPENDECTOMY     COLONOSCOPY     DILATATION & CURETTAGE/HYSTEROSCOPY WITH MYOSURE N/A 02/25/2015   Procedure: DILATATION & CURETTAGE/HYSTEROSCOPY WITH MYOSURE;  Surgeon: [name], MD;  Location: [facility];  Service: Gynecology;  Laterality: N/A;   DILATION AND CURETTAGE OF UTERUS     ENUCLEATION Right 03/16/2017   ENUCLEATION Right 03/16/2017   Procedure: ENUCLEATION RIGHT EYE;  Surgeon: [name], MD;  Location: [facility] OR;  Service: Ophthalmology;  Laterality: Right;   EYE SURGERY     right eye @  at 6, 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   EYE SURGERY Right ~ 1974   "S/P initial eye injury; scissors stuck in my eye""   LAPAROSCOPIC CHOLECYSTECTOMY     SHOULDER ARTHROSCOPY WITH ROTATOR CUFF REPAIR Left    wire stitches per patient   SHOULDER ARTHROSCOPY WITH ROTATOR CUFF REPAIR Right    Family History:  Family History  Problem Relation Age of Onset   Diabetes Mother    CAD Mother    Lung cancer Father    Family Psychiatric  History: unknown Social History:  Social History   Substance and Sexual  Activity  Alcohol Use Yes   Comment: 03/16/2017 "nothing since 2013"     Social History   Substance and Sexual Activity  Drug Use No    Social History   Socioeconomic History   Marital status: Widowed    Spouse name: Not on file   Number of children: Not on file   Years of education: Not on file   Highest education level: Not on file  Occupational History   Not on file  Tobacco Use   Smoking status: Former    Packs/day: 0.50    Years: 40.00    Pack years: 20.00    Types: Cigarettes    Quit date: 2016    Years since quitting: 6.8   Smokeless tobacco: Never  Vaping Use   Vaping Use: Never used  Substance and Sexual Activity   Alcohol use: Yes    Comment: 03/16/2017 "nothing since 2013"   Drug use: No   Sexual activity: Not Currently    Birth control/protection: Post-menopausal  Other Topics Concern   Not on file  Social History Narrative   Not on file   Social Determinants of Health   Financial Resource Strain: Not on file  Food Insecurity: Not on file  Transportation Needs: Not on file  Physical Activity: Not on file  Stress: Not on file  Social Connections: Not on file   Additional Social History:        Sleep: Good  Appetite:  Fair  Current Medications: Current Facility-Administered Medications  Medication Dose Route Frequency Provider Last Rate Last Admin   acetaminophen (TYLENOL) tablet 650 mg  650 mg Oral Q6H PRN Derrill Center, NP       alum & mag hydroxide-simeth (MAALOX/MYLANTA) 200-200-20 MG/5ML suspension 30 mL  30 mL Oral Q4H PRN Derrill Center, NP       atenolol (TENORMIN) tablet 12.5 mg  12.5 mg Oral Daily Corky Sox, MD   12.5 mg at 10/10/21 0744   atorvastatin (LIPITOR) tablet 40 mg  40 mg Oral Daily Derrill Center, NP   40 mg at 10/10/21 0744   ciprofloxacin (CIPRO) tablet 250 mg  250 mg Oral BID Corky Sox, MD       divalproex (DEPAKOTE) DR tablet 500 mg  500 mg Oral QHS Derrill Center, NP   500 mg at 10/09/21 2058    feeding supplement (ENSURE ENLIVE / ENSURE PLUS) liquid 237 mL  237 mL Oral BID BM Lamiyah Schlotter, Jackie Plum, MD   237 mL at 10/10/21 1519   hydrOXYzine (ATARAX/VISTARIL) tablet 25 mg  25 mg Oral TID PRN Derrill Center, NP       ibuprofen (ADVIL) tablet 600 mg  600 mg Oral TID PRN Bobbitt, Shalon E, NP   600 mg at 10/10/21 0815   magnesium hydroxide (MILK OF MAGNESIA) suspension 30 mL  30 mL Oral Daily PRN Ricky Ala  N, NP       methimazole (TAPAZOLE) tablet 10 mg  10 mg Oral Daily Corky Sox, MD   10 mg at 10/09/21 1610   risperiDONE (RISPERDAL) tablet 2 mg  2 mg Oral QHS Corky Sox, MD   2 mg at 10/09/21 2058   traZODone (DESYREL) tablet 50 mg  50 mg Oral QHS PRN Derrill Center, NP   50 mg at 10/08/21 2052    Lab Results:  Results for orders placed or performed during the hospital encounter of 10/07/21 (from the past 48 hour(s))  Urinalysis, Complete w Microscopic Urine, Clean Catch     Status: Abnormal   Collection Time: 10/08/21  9:18 PM  Result Value Ref Range   Color, Urine YELLOW YELLOW   APPearance CLEAR CLEAR   Specific Gravity, Urine 1.014 1.005 - 1.030   pH 5.0 5.0 - 8.0   Glucose, UA NEGATIVE NEGATIVE mg/dL   Hgb urine dipstick NEGATIVE NEGATIVE   Bilirubin Urine NEGATIVE NEGATIVE   Ketones, ur 20 (A) NEGATIVE mg/dL   Protein, ur NEGATIVE NEGATIVE mg/dL   Nitrite NEGATIVE NEGATIVE   Leukocytes,Ua MODERATE (A) NEGATIVE   RBC / HPF 0-5 0 - 5 RBC/hpf   WBC, UA 6-10 0 - 5 WBC/hpf   Bacteria, UA NONE SEEN NONE SEEN   Squamous Epithelial / LPF 0-5 0 - 5    Comment: Performed at [facility], [place] [address], [place], [name] [address]    Blood Alcohol level:  Lab Results  Component Value Date   [facility] <10 10/07/2021   ETH <10 [id number]    Metabolic Disorder Labs: Lab Results  Component Value Date   HGBA1C 5.6 10/07/2021   MPG 114.02 10/07/2021   No results found for: PROLACTIN Lab Results  Component Value Date   CHOL 253 (H) 10/07/2021    TRIG 99 10/07/2021   HDL 56 10/07/2021   CHOLHDL 4.5 10/07/2021   VLDL 20 10/07/2021   LDLCALC 177 (H) 10/07/2021    Physical Findings: AIMS: not yet assessed  Musculoskeletal: Strength & Muscle Tone: within normal limits Gait & Station:  not assessed Patient leans: NA  Psychiatric Specialty Exam:  Presentation  General Appearance: Fairly Groomed; Disheveled  Eye Contact:Fleeting  Speech:Normal Rate  Speech Volume:Normal  Handedness:Right  Mood and Affect  Mood: "fine" Affect:Constricted; Non-Congruent   Thought Process  Thought Processes:Goal Directed  Descriptions of Associations:Circumstantial  Orientation: oriented to person place, time and event  Thought Content:Delusions; Illogical; Paranoid Ideation; Tangential  History of Schizophrenia/Schizoaffective disorder:Yes  Duration of Psychotic Symptoms:Greater than six months  Hallucinations:Hallucinations: None  Ideas of Reference:Delusions; Paranoia  Suicidal Thoughts:Suicidal Thoughts: No  Homicidal Thoughts:Homicidal Thoughts: No   Sensorium  Memory:Immediate Fair; Recent Fair; Remote Fair  Judgment:Impaired  Insight:Lacking   Executive Functions  Concentration:Fair  Attention Span:Fair  Sharon   Psychomotor Activity  Psychomotor Activity:Psychomotor Activity: Decreased   Assets  Assets:Communication Skills; Desire for Improvement   Sleep  Sleep: fair Patient slept Number of Hours: 7.5   Physical Exam: Physical Exam Vitals reviewed.  Constitutional:      Appearance: [name] not ill-appearing or toxic-appearing.  Eyes:     Extraocular Movements: Extraocular movements intact.  Pulmonary:     Effort: Pulmonary effort is normal.  Musculoskeletal:        General: Normal range of motion.     Cervical back: Normal range of motion.  Neurological:     General: No focal deficit present.  Mental Status: [name] alert.    Review of Systems  Respiratory:  Negative for shortness of breath.   Cardiovascular:  Negative for chest pain.  Gastrointestinal:  Negative for nausea and vomiting.  Blood pressure 110/68, pulse 79, temperature 98.8 F (37.1 C), temperature source Oral, resp. rate 16, height 5\' 3"  (1.6 m), weight 60.8 kg, SpO2 100 %. Body mass index is 23.74 kg/m.   Treatment Plan Summary: Daily contact with patient to assess and evaluate symptoms and progress in treatment and Medication management  Assessment Schizophrenia   Psychosis -Continue Risperdal at 2 mg for psychosis, slow titration given age of patient -Continue home Depakote 500 mg for mood stabilization             -Depakote level of 0 on admission  -We will have repeat Depakote level. -Follow-up Depakote level Antipsychotic labs             EKG: sinus brady, some inverted T-waves, discussed with ED attending, Qtc of 427             Lipids: chol of 253, LDL 177 (collected at 2 AM)             A1C: 5.6 AIMS: not yet assessed   Medical Management Covid negative CMP: unremarkable CBC: unremarkable EtOH: <10 UDS: negative Upreg: NA TSH: 1.2 UA: leukocytes, negative nitrites   Potential UTI Patient denies symptoms, however, given UA and psychosis will continue to treat with: Patient has been refusing her Cipro.  As she has not been endorsing symptoms, will have repeat UA., considering she could be colonizing. -Cipro 250 mg BID for 3 days -Follow-up repeat UA  HTN Continue to hold home lisinopril  Hyperthyroidism A. fib Verified in endocrinology notes. Patient last filled her 24 d supply of methimazole on 7/6 and ran out one month ago. Concerned the patient had A. fib on EKG, we will repeat A. fib now that she has restarted her home medicines. -Continue methimazole -Continue atenolol  -Follow-up repeat EKG  Sciatica Offered patient gabapentin or Lyrica, however she says that she tried in the past and they have been  ineffective.  She only wants the ibuprofen 800 mg. Stated that we are concerned for possible GI bleed and other side effects, we will hold off for now.  Discussed with patient trial with lidocaine patch to see if there is any relief. -Lidocaine patch  GERD -Protonix  Merrily Brittle, DO Psychiatry Resident, PGY-1 [facility] 10/10/2021, 6:12 PM

## 2021-10-10 NOTE — Group Note (Signed)
LCSW Group Therapy Note   Group Date: 10/10/2021 Start Time: 1100 End Time: 1130   Type of Therapy and Topic:  Group Therapy: Boundaries  Participation Level:  Active  Description of Group: This group will address the use of boundaries in their personal lives. Patients will explore why boundaries are important, the difference between healthy and unhealthy boundaries, and negative and postive outcomes of different boundaries and will look at how boundaries can be crossed.  Patients will be encouraged to identify current boundaries in their own lives and identify what kind of boundary is being set. Facilitators will guide patients in utilizing problem-solving interventions to address and correct types boundaries being used and to address when no boundary is being used. Understanding and applying boundaries will be explored and addressed for obtaining and maintaining a balanced life. Patients will be encouraged to explore ways to assertively make their boundaries and needs known to significant others in their lives, using other group members and facilitator for role play, support, and feedback.  Therapeutic Goals:  1.  Patient will identify areas in their life where setting clear boundaries could be  used to improve their life.  2.  Patient will identify signs/triggers that a boundary is not being respected. 3.  Patient will identify two ways to set boundaries in order to achieve balance in  their lives: 4.  Patient will demonstrate ability to communicate their needs and set boundaries  through discussion and/or role plays  Summary of Patient Progress:  Due to the acuity and low staffing, group was not held on the adult unit today. Patients were provided therapeutic worksheets and asked to meet with CSW as needed.  Therapeutic Modalities:   Cognitive Behavioral Therapy Solution-Focused Therapy  Christon Gallaway M Dhruvi Crenshaw, LCSWA 10/10/2021  10:27 AM    

## 2021-10-10 NOTE — Progress Notes (Signed)
Pt didn't attend orientation/goals group. 

## 2021-10-10 NOTE — Progress Notes (Signed)
Pt didn't attend therapeutic relaxation group. 

## 2021-10-10 NOTE — Progress Notes (Signed)
Adult Psychoeducational Group Note  Date:  10/10/2021 Time:  10:03 PM  Group Topic/Focus:  Wrap-Up Group:   The focus of this group is to help patients review their daily goal of treatment and discuss progress on daily workbooks.  Participation Level:  Did Not Attend  Participation Quality:   Did Not Attend  Affect:   Did Not Attend  Cognitive:   Did Not Attend  Insight: None  Engagement in Group:   Did Not Attend  Modes of Intervention:   Did Not Attend  Additional Comments:  Pt did not attend evening wrap up group tonight.  Felipa Furnace 10/10/2021, 10:03 PM

## 2021-10-10 NOTE — [facility] Group Notes (Signed)
Adult Psychoeducational Group Not Date:  10/10/2021 Time:  0900-1045 Group Topic/Focus: PROGRESSIVE RELAXATION. A group where deep breathing is taught and tensing and relaxation muscle groups is used. Imagery is used as well.  Pts are asked to imagine 3 pillars that hold them up when they are not able to hold themselves up.  Participation Level:  Pt invited to the group, but did not attend Aidee Latimore A   

## 2021-10-11 DIAGNOSIS — F23 Brief psychotic disorder: Secondary | ICD-10-CM | POA: Diagnosis not present

## 2021-10-11 LAB — COMPREHENSIVE METABOLIC PANEL
ALT: 9 U/L (ref 0–44)
AST: 20 U/L (ref 15–41)
Albumin: 3.7 g/dL (ref 3.5–5.0)
Alkaline Phosphatase: 92 U/L (ref 38–126)
Anion gap: 10 (ref 5–15)
BUN: 15 mg/dL (ref 8–23)
CO2: 24 mmol/L (ref 22–32)
Calcium: 9.8 mg/dL (ref 8.9–10.3)
Chloride: 105 mmol/L (ref 98–111)
Creatinine, Ser: 0.88 mg/dL (ref 0.44–1.00)
GFR, Estimated: >60 mL/min (ref >60)
Glucose, Bld: 102 mg/dL — ABNORMAL HIGH (ref 70–99)
Potassium: 4.5 mmol/L (ref 3.5–5.1)
Sodium: 139 mmol/L (ref 135–145)
Total Bilirubin: 0.5 mg/dL (ref 0.3–1.2)
Total Protein: 7.1 g/dL (ref 6.5–8.1)

## 2021-10-11 MED ORDER — BOOST / RESOURCE BREEZE PO LIQD CUSTOM
1.0000 | Freq: Two times a day (BID) | ORAL | Status: DC
  Administered 2021-10-11 – 2021-10-26 (×28): 1 via ORAL
  Filled 2021-10-11 (×34): qty 1

## 2021-10-11 MED ORDER — LISINOPRIL 10 MG PO TABS
10.0000 mg | ORAL_TABLET | Freq: Every day | ORAL | Status: DC
  Administered 2021-10-12 – 2021-10-26 (×15): 10 mg via ORAL
  Filled 2021-10-11: qty 1
  Filled 2021-10-11: qty 2
  Filled 2021-10-11 (×2): qty 1
  Filled 2021-10-11: qty 2
  Filled 2021-10-11 (×5): qty 1
  Filled 2021-10-11: qty 2
  Filled 2021-10-11 (×5): qty 1
  Filled 2021-10-11: qty 2
  Filled 2021-10-11 (×2): qty 1
  Filled 2021-10-11 (×3): qty 2
  Filled 2021-10-11 (×2): qty 1

## 2021-10-11 MED ORDER — RISPERIDONE 1 MG PO TABS
1.0000 mg | ORAL_TABLET | Freq: Every day | ORAL | Status: DC
  Administered 2021-10-12 – 2021-10-13 (×2): 1 mg via ORAL
  Filled 2021-10-11 (×4): qty 1

## 2021-10-11 MED ORDER — RISPERIDONE 0.5 MG PO TABS
0.5000 mg | ORAL_TABLET | Freq: Every day | ORAL | Status: DC
  Administered 2021-10-11: 0.5 mg via ORAL
  Filled 2021-10-11 (×3): qty 1

## 2021-10-11 NOTE — Progress Notes (Signed)
Pt kept to herself this evening, pt minimized her situation and denied everything    10/11/21 2000  Psych Admission Type (Psych Patients Only)  Admission Status Involuntary  Psychosocial Assessment  Patient Complaints Anxiety  Eye Contact Suspiciousness;Avertive  Facial Expression Flat  Affect Appropriate to circumstance;Preoccupied  Speech Logical/coherent  Interaction Assertive  Motor Activity Slow  Appearance/Hygiene Unremarkable  Behavior Characteristics Cooperative  Mood Labile;Preoccupied  Aggressive Behavior  Effect No apparent injury  Thought Administrator, sports thinking  Content Paranoia  Delusions Paranoid;Persecutory  Perception Hallucinations  Hallucination Auditory  Judgment Impaired  Confusion None  Danger to Self  Current suicidal ideation? Denies  Danger to Others  Danger to Others None reported or observed

## 2021-10-11 NOTE — Group Note (Signed)
Recreation Therapy Group Note   Group Topic:Coping Skills  Group Date: 10/11/2021 Start Time: 1000 End Time: 1030 Facilitators: Caroll Rancher, LRT/CTRS Location: [address] Dayroom   Goal Area(s) Addresses:  Patients will identify positive coping skills. Patients will identify benefit of using positive coping skills. Patients will identify positive coping skills to use post d/c.  Group Description: Mind Map.  Patients and LRT filled out the first 8 boxes of the mind map together with anger, mad, stress, gittery, hypomanic, thrifty and two others.  Patients were given 15 minutes to break off and identify at least three coping skills for each situation.  The group would come back together and LRT will right coping skills on the board.       Affect/Mood: N/A   Participation Level: Did not attend    Clinical Observations/Individualized Feedback: Pt did not attend group.    Plan: Continue to engage patient in RT group sessions 2-3x/week.   Caroll Rancher, LRT/CTRS 10/11/2021 1:39 PM

## 2021-10-11 NOTE — Progress Notes (Signed)
Psychoeducational Group Note  Date:  10/11/2021 Time:  2253  Group Topic/Focus:  Wrap-Up Group:   The focus of this group is to help patients review their daily goal of treatment and discuss progress on daily workbooks.  Participation Level: Did Not Attend  Participation Quality:  Not Applicable  Affect:  Not Applicable  Cognitive:  Not Applicable  Insight:  Not Applicable  Engagement in Group: Not Applicable  Additional Comments:  The patient did not attend group this evening.   Hazle Coca S 10/11/2021, 10:53 PM

## 2021-10-11 NOTE — [facility] Group Notes (Signed)
Pt did not attend evening group. 

## 2021-10-11 NOTE — Group Note (Signed)
Occupational Therapy Group Note  Group Topic:Self-Esteem  Group Date: 10/11/2021 Start Time: 1400 End Time: 1435 Facilitators: Aminta Sakurai, OT/L   Group Description: Group encouraged increased engagement and participation through discussion and activity focused on self-esteem. Patients explored and discussed the differences between healthy and low self-esteem and how it affects our daily lives and occupations with a focus on relationships, work, school, self-care, and personal leisure interests. Group discussion then transitioned into identifying specific strategies to boost self-esteem and engaged in a collaborative and independent activity looking at positive ways to describe oneself A-Z.   Therapeutic Goal(s): Understand and recognize the differences between healthy and low self-esteem Identify healthy strategies to improve/build self-esteem    Participation Level: Did not attend   Plan: Continue to engage patient in OT groups 2 - 3x/week.  10/11/2021  Dorsie Burich, OT/L 

## 2021-10-11 NOTE — [facility] Group Notes (Signed)
Patient did not attend group.    Spiritual care group on grief and loss facilitated by chaplain Katy Deionna Marcantonio, BCC   Group Goal:   Support / Education around grief and loss   Members engage in facilitated group support and psycho-social education.   Group Description:   Following introductions and group rules, group members engaged in facilitated group dialog and support around topic of loss, with particular support around experiences of loss in their lives. Group Identified types of loss (relationships / self / things) and identified patterns, circumstances, and changes that precipitate losses. Reflected on thoughts / feelings around loss, normalized grief responses, and recognized variety in grief experience. Group noted Worden's four tasks of grief in discussion.   Group drew on Adlerian / Rogerian, narrative, MI,    

## 2021-10-11 NOTE — Progress Notes (Signed)
[facility] MD Progress Note  10/11/2021 3:10 PM Shari [name]  MRN:  [id number] Subjective:   The patient is a 65 year old female with a past psychiatric history of unspecified psychotic disorder presenting under IVC for paranoia and delusions. She believes that there are people in the woods near her house that have tried to shoot her.   The patient's chart was reviewed and nursing notes were reviewed. Over the past 24 hrs, there were no documented behavioral issues, no PRN medications given for agitation.The patient's case was discussed in multidisciplinary team meeting.   The care team made the following medication changes yesterday: No changes made -Continue Risperdal 2 mg qHS -Continue home Depakote 500 mg for mood stabilization  On interview this morning, the patient is irritable.  She relates this to being upset about her room lockout.  She reports what appears to be paranoia regarding her food, stating that she will not eat food that is handed to her.  She says that she needs to see the food taken from the food cart.  She continues to feel safe on the unit.  She reports feeling unsure if there are people in the woods watching her.  Overall, she appears similar to previous days, despite an increase in her Risperdal.  She denies suicidal thoughts and auditory/visual hallucinations.  When asked about ideas of reference she hesitates and appears guarded.  She eventually denies ideas of reference.  Principal Problem: Acute psychosis ([place]) Diagnosis: Principal Problem:   Acute psychosis ([place])  Total Time Spent in Direct Patient Care: I personally spent 30 minutes on the unit in direct patient care. The direct patient care time included face-to-face time with the patient, reviewing the patient's chart, communicating with other professionals, and coordinating care. Greater than 50% of this time was spent in counseling or coordinating care with the patient regarding goals of hospitalization,  psycho-education, and discharge planning needs.   Past Psychiatric History: unspecified psychotic disorder  Past Medical History:  Past Medical History:  Diagnosis Date   Eye globe prosthesis    GERD (gastroesophageal reflux disease)    History of blood transfusion 1973   "related to abscess burst in my stomach"   Hyperlipemia    Hyperlipidemia    Hypertension    Osteoarthritis of back    Lowerback    SVD (spontaneous vaginal delivery)    x 1, baby died at 2 wks of age   Type II diabetes mellitus ([place])     Past Surgical History:  Procedure Laterality Date   APPENDECTOMY     COLONOSCOPY     DILATATION & CURETTAGE/HYSTEROSCOPY WITH MYOSURE N/A 02/25/2015   Procedure: DILATATION & CURETTAGE/HYSTEROSCOPY WITH MYOSURE;  Surgeon: Marylynn Pearson, MD;  Location: [facility];  Service: Gynecology;  Laterality: N/A;   DILATION AND CURETTAGE OF UTERUS     ENUCLEATION Right 03/16/2017   ENUCLEATION Right 03/16/2017   Procedure: ENUCLEATION RIGHT EYE;  Surgeon: Clista Bernhardt, MD;  Location: [place];  Service: Ophthalmology;  Laterality: Right;   EYE SURGERY     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6, 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   EYE SURGERY Right ~ 1974   "S/P initial eye injury; scissors stuck in my eye""   LAPAROSCOPIC CHOLECYSTECTOMY     SHOULDER ARTHROSCOPY WITH ROTATOR CUFF REPAIR Left    wire stitches per patient   SHOULDER ARTHROSCOPY WITH ROTATOR CUFF REPAIR Right    Family History:  Family History  Problem Relation Age of Onset   Diabetes Mother  CAD Mother    Lung cancer Father    Family Psychiatric  History: unknown Social History:  Social History   Substance and Sexual Activity  Alcohol Use Yes   Comment: 03/16/2017 "nothing since 2013"     Social History   Substance and Sexual Activity  Drug Use No    Social History   Socioeconomic History   Marital status: Widowed    Spouse name: Not on file   Number of children: Not on file   Years of education: Not on file   Highest education  level: Not on file  Occupational History   Not on file  Tobacco Use   Smoking status: Former    Packs/day: 0.50    Years: 40.00    Pack years: 20.00    Types: Cigarettes    Quit date: 2016    Years since quitting: 6.8   Smokeless tobacco: Never  Vaping Use   Vaping Use: Never used  Substance and Sexual Activity   Alcohol use: Yes    Comment: 03/16/2017 "nothing since 2013"   Drug use: No   Sexual activity: Not Currently    Birth control/protection: Post-menopausal  Other Topics Concern   Not on file  Social History Narrative   Not on file   Social Determinants of Health   Financial Resource Strain: Not on file  Food Insecurity: Not on file  Transportation Needs: Not on file  Physical Activity: Not on file  Stress: Not on file  Social Connections: Not on file   Additional Social History:        Sleep: Good  Appetite:  Fair  Current Medications: Current Facility-Administered Medications  Medication Dose Route Frequency Provider Last Rate Last Admin   acetaminophen (TYLENOL) tablet 650 mg  650 mg Oral Q6H PRN Oneta Rack, NP       alum & mag hydroxide-simeth (MAALOX/MYLANTA) 200-200-20 MG/5ML suspension 30 mL  30 mL Oral Q4H PRN Oneta Rack, NP       atenolol (TENORMIN) tablet 12.5 mg  12.5 mg Oral Daily Carlyn Reichert, MD   12.5 mg at 10/11/21 0835   atorvastatin (LIPITOR) tablet 40 mg  40 mg Oral Daily Oneta Rack, NP   40 mg at 10/11/21 0835   divalproex (DEPAKOTE) DR tablet 500 mg  500 mg Oral QHS Oneta Rack, NP   500 mg at 10/10/21 2028   feeding supplement (BOOST / RESOURCE BREEZE) liquid 1 Container  1 Container Oral BID BM Massengill, Harrold Donath, MD       hydrOXYzine (ATARAX/VISTARIL) tablet 25 mg  25 mg Oral TID PRN Oneta Rack, NP   25 mg at 10/11/21 1309   ibuprofen (ADVIL) tablet 600 mg  600 mg Oral TID PRN Bobbitt, Shalon E, NP   600 mg at 10/11/21 0836   magnesium hydroxide (MILK OF MAGNESIA) suspension 30 mL  30 mL Oral Daily PRN  Oneta Rack, NP       methimazole (TAPAZOLE) tablet 10 mg  10 mg Oral Daily Carlyn Reichert, MD   10 mg at 10/11/21 0835   pantoprazole (PROTONIX) EC tablet 40 mg  40 mg Oral BID AC Princess Bruins, DO   40 mg at 10/11/21 6301   risperiDONE (RISPERDAL) tablet 0.5 mg  0.5 mg Oral Daily Carlyn Reichert, MD   0.5 mg at 10/11/21 1309   risperiDONE (RISPERDAL) tablet 2 mg  2 mg Oral QHS Carlyn Reichert, MD   2 mg at 10/10/21 2028  traZODone (DESYREL) tablet 50 mg  50 mg Oral QHS PRN Derrill Center, NP   50 mg at 10/08/21 2052    Lab Results:  Results for orders placed or performed during the hospital encounter of 10/07/21 (from the past 48 hour(s))  Valproic acid level     Status: Abnormal   Collection Time: 10/10/21  6:44 PM  Result Value Ref Range   Valproic Acid Lvl 30 (L) 50.0 - 100.0 ug/mL    Comment: Performed at [facility], [place] [address], [place], [place] [address]  Comprehensive metabolic panel     Status: Abnormal   Collection Time: 10/11/21  6:36 AM  Result Value Ref Range   Sodium 139 135 - 145 mmol/L   Potassium 4.5 3.5 - 5.1 mmol/L   Chloride 105 98 - 111 mmol/L   CO2 24 22 - 32 mmol/L   Glucose, Bld 102 (H) 70 - 99 mg/dL    Comment: Glucose reference range applies only to samples taken after fasting for at least 8 hours.   BUN 15 8 - 23 mg/dL   Creatinine, Ser 0.88 0.44 - 1.00 mg/dL   Calcium 9.8 8.9 - 10.3 mg/dL   Total Protein 7.1 6.5 - 8.1 g/dL   Albumin 3.7 3.5 - 5.0 g/dL   AST 20 15 - 41 U/L   ALT 9 0 - 44 U/L   Alkaline Phosphatase 92 38 - 126 U/L   Total Bilirubin 0.5 0.3 - 1.2 mg/dL   GFR, Estimated >60 >60 mL/min    Comment: (NOTE) Calculated using the CKD-EPI Creatinine Equation (2021)    Anion gap 10 5 - 15    Comment: Performed at [facility], [place] [address] [place], [place] [address]    Blood Alcohol level:  Lab Results  Component Value Date   ETH <10 10/07/2021   ETH <10 99991111    Metabolic  Disorder Labs: Lab Results  Component Value Date   HGBA1C 5.6 10/07/2021   MPG 114.02 10/07/2021   No results found for: PROLACTIN Lab Results  Component Value Date   CHOL 253 (H) 10/07/2021   TRIG 99 10/07/2021   HDL 56 10/07/2021   CHOLHDL 4.5 10/07/2021   VLDL 20 10/07/2021   LDLCALC 177 (H) 10/07/2021    Physical Findings: AIMS: not yet assessed  Musculoskeletal: Strength & Muscle Tone: within normal limits Gait & Station:  not assessed Patient leans: NA  Psychiatric Specialty Exam:  Presentation  General Appearance: appropriate for environment Eye Contact:Fleeting  Speech:Normal Rate  Speech Volume:Normal  Handedness:Right  Mood and Affect  Mood: "fine" Affect:Constricted; Non-Congruent   Thought Process  Thought Processes:Goal Directed  Descriptions of Associations:Circumstantial  Orientation: oriented to person place, time and event  Thought Content:Delusions; Illogical; Paranoid Ideation; Tangential  History of Schizophrenia/Schizoaffective disorder:Yes  Duration of Psychotic Symptoms:Greater than six months  Hallucinations:Hallucinations: None  Ideas of Reference:Delusions; Paranoia  Suicidal Thoughts:Suicidal Thoughts: No  Homicidal Thoughts:Homicidal Thoughts: No   Sensorium  Memory:Immediate Fair; Recent Fair; Remote Fair  Judgment:Impaired  Insight:Lacking   Executive Functions  Concentration:Fair  Attention Span:Fair  [place]   Psychomotor Activity  Psychomotor Activity:Psychomotor Activity: Decreased   Assets  Assets:Communication Skills; Desire for Improvement   Sleep  Sleep: fair Patient slept Number of Hours: 1.75   Physical Exam: Physical Exam Vitals reviewed.  Constitutional:      Appearance: [name] not ill-appearing or toxic-appearing.  Eyes:     Extraocular Movements:  Extraocular movements intact.  Pulmonary:     Effort: Pulmonary effort is normal.   Musculoskeletal:        General: Normal range of motion.     Cervical back: Normal range of motion.  Neurological:     General: No focal deficit present.     Mental Status: [name] alert.   Review of Systems  Respiratory:  Negative for shortness of breath.   Cardiovascular:  Negative for chest pain.  Gastrointestinal:  Negative for nausea and vomiting.  Blood pressure 125/66, pulse 79, temperature 98 F (36.7 C), temperature source Oral, resp. rate 16, height 5\' 3"  (1.6 m), weight 60.8 kg, SpO2 100 %. Body mass index is 23.74 kg/m.   Treatment Plan Summary: Daily contact with patient to assess and evaluate symptoms and progress in treatment and Medication management  Assessment Schizophrenia   Psychosis -Begin Risperdal 0.5 mg x1 / 2 mg QHS. -Risperdal 1 mg / 2 mg QHS for 11/8 -Continue home Depakote 500 mg for mood stabilization             -Depakote level of 0 on admission  -Repeat Depakote level of 30  -Given that the patient does not have significant mood lability or persistent irritability will continue at current dose Antipsychotic labs             EKG: sinus brady, some inverted T-waves, discussed with ED attending, Qtc of 427             Lipids: chol of 253, LDL 177 (collected at 2 AM)             A1C: 5.6 AIMS: not yet assessed   Medical Management Covid negative CMP: unremarkable CBC: unremarkable EtOH: <10 UDS: negative Upreg: NA TSH: 1.2 UA: leukocytes, negative nitrites   Potential UTI Patient denies symptoms, however, given UA and psychosis will continue to treat. Patient has been refusing Cipro consistently, saying that she does not have the symptoms of a UTI.  -Cipro 250 mg BID for 3 days -Follow-up repeat UA  HTN -Home lisinopril 10 mg  Hyperthyroidism A. fib Verified in endocrinology notes. Patient last filled her 2 d supply of methimazole on 7/6 and ran out one month ago. -Continue methimazole -Continue atenolol   Sciatica Offered  patient gabapentin or Lyrica, however she says that she tried in the past and they have been ineffective. Discussed with patient trial with lidocaine patch to see if there is any relief. -Lidocaine patch  GERD -Protonix  Corky Sox, MD Psychiatry Resident, PGY-1 [facility] 10/11/2021, 3:10 PM

## 2021-10-11 NOTE — Progress Notes (Addendum)
Pt presents agitated with blunted affect, avertive eye contact and is ambulatory in milieu with a steady gait. Remains suspicious, paranoid and preoccupied. Noted actively responding to internal stimuli; as as evidenced by talking to self in hall & at medications window. Denies SI, HI and AVH when assessed. Received PRN Advil 600 mg PO at 0836 for bilateral leg pain 7/10. Reported relief when reassessed at 0930. Refused to come out of her room to participate in scheduled groups, which pt has not done since admitted to unit. Pt continued to give multiple reasons why she couldn't participate in unit routines "I need my clothes washed. I can't be in there right now, why do I have to? Even though all her concerns were met over the weekend. Continues to ruminate about events leading to admission "I didn't ask to come here. My so call sister put me in here". Pt received PRN Vistaril at 1309 for continued verbal agitation, pacing at intervals prior to transfer to 400 hall. Reported relief when reassessed at 1405. She tolerates meals and medication well without discomfort.  Q 15 minutes safety checks maintained without self harm gestures. All medications given with verbal education and effects monitored. New order received for room lock down, to foster participation in unit routines. Pt made aware. Support and reassurance provided to pt this shift. Encouraged pt to voice concerns.  Pt denies concerns at this time. Remains safe on and off unit. Continues to need frequent verbal redirections to participated in her care.

## 2021-10-11 NOTE — Group Note (Signed)
  Type of Therapy and Topic:  Group Therapy:  Healthy and Unhealthy Supports  Participation Level:  Did Not Attend   Description of Group:  Patients in this group were introduced to the idea of adding a variety of healthy supports to address the various needs in their lives.Patients discussed what additional healthy supports could be helpful in their recovery and wellness after discharge in order to prevent future hospitalizations.   An emphasis was placed on using counselor, doctor, therapy groups, 12-step groups, and problem-specific support groups to expand supports.  They also worked as a group on developing a specific plan for several patients to deal with unhealthy supports through boundary-setting, psychoeducation with loved ones, and even termination of relationships.   Therapeutic Goals:   1)  discuss importance of adding supports to stay well once out of the hospital  2)  compare healthy versus unhealthy supports and identify some examples of each  3)  generate ideas and descriptions of healthy supports that can be added  4)  offer mutual support about how to address unhealthy supports  5)  encourage active participation in and adherence to discharge plan    Summary of Patient Progress:  Did not attend   Therapeutic Modalities:   Motivational Interviewing Brief Solution-Focused Therapy  Gardiner Sleeper Diala Waxman, LCSW 10/11/2021  2:06 PM

## 2021-10-11 NOTE — Progress Notes (Signed)
Nutrition Brief Note RD working remotely.   Patient identified on the Malnutrition Screening Tool (MST) Report.  Wt Readings from Last 15 Encounters:  10/07/21 60.8 kg  10/07/21 60.8 kg  08/10/21 57 kg  12/23/19 57.8 kg  10/28/19 47.6 kg  09/20/19 49.4 kg  07/26/19 49 kg  06/05/19 49.9 kg  05/29/19 50.3 kg  03/16/17 72.7 kg  02/23/15 69.4 kg    Body mass index is 23.74 kg/m. Patient meets criteria for normal weight status based on current BMI.   Current diet order is Regular and patient is eating as desired for meals and snacks at this time. Labs and medications reviewed.   Ensure Enlive was ordered BID per ONS protocol at the time of admission and patient has been accepting this supplement 100% of the time offered. Each supplement provides 350 kcal and 20 grams protein.   No nutrition interventions warranted at this time. If nutrition issues arise, please consult RD.      Shari Gammon, MS, RD, LDN, CNSC Inpatient Clinical Dietitian RD pager # available in [facility]  After hours/weekend pager # available in [facility]

## 2021-10-12 DIAGNOSIS — F23 Brief psychotic disorder: Secondary | ICD-10-CM | POA: Diagnosis not present

## 2021-10-12 NOTE — Progress Notes (Signed)
Progress note    10/12/21 0820  Psych Admission Type (Psych Patients Only)  Admission Status Involuntary  Psychosocial Assessment  Patient Complaints Anxiety  Eye Contact Fair  Facial Expression Anxious;Pensive  Affect Anxious;Appropriate to circumstance  Speech Logical/coherent  Motor Activity Slow  Appearance/Hygiene Unremarkable  Behavior Characteristics Cooperative;Appropriate to situation;Anxious  Mood Anxious;Pleasant  Thought Process  Coherency Concrete thinking  Content WDL  Delusions None reported or observed  Perception WDL  Hallucination None reported or observed  Judgment Poor  Confusion None  Danger to Self  Current suicidal ideation? Denies  Danger to Others  Danger to Others None reported or observed

## 2021-10-12 NOTE — [facility] Group Notes (Signed)
Patient did not attend group.

## 2021-10-12 NOTE — [facility] Counselor (Signed)
CSW provided this pt with a homeless packet.  Pt continues to declined services at discharge and is unable to participate in her discharge planning.   Pt reports to CSW that she is in a witness protection program, however has no other information about this.     Ruthann Cancer MSW, LCSW Clincal Social Worker  [facility]

## 2021-10-12 NOTE — Progress Notes (Signed)
   10/12/21 2100  Psych Admission Type (Psych Patients Only)  Admission Status Involuntary  Psychosocial Assessment  Patient Complaints Anxiety  Eye Contact Fair  Facial Expression Anxious;Pensive  Affect Anxious;Appropriate to circumstance  Speech Logical/coherent  Motor Activity Slow  Appearance/Hygiene Unremarkable  Behavior Characteristics Cooperative  Mood Anxious;Pleasant  Thought Process  Coherency Concrete thinking  Content WDL  Delusions None reported or observed  Perception WDL  Hallucination None reported or observed  Judgment Poor  Confusion None  Danger to Self  Current suicidal ideation? Denies  Danger to Others  Danger to Others None reported or observed

## 2021-10-12 NOTE — Plan of Care (Signed)
  Problem: Education: Goal: Knowledge of [facility] Education information/materials will improve Outcome: Progressing Goal: Verbalization of understanding the information provided will improve Outcome: Progressing   Problem: Activity: Goal: Sleeping patterns will improve Outcome: Progressing   

## 2021-10-12 NOTE — Group Note (Signed)
Recreation Therapy Group Note   Group Topic:Animal Assisted Therapy   Group Date: 10/12/2021 Start Time: 1430 End Time: 1500 Facilitators: Caroll Rancher, LRT/CTRS Location: [address]   AAA/T Program Assumption of Risk Form signed by Patient/ or Parent Legal Guardian Yes  Patient understands his/her participation is voluntary Yes   Affect/Mood: N/A   Participation Level: Did not attend    Clinical Observations/Individualized Feedback: Pt did not attend group.    Plan: Continue to engage patient in RT group sessions 2-3x/week.   Caroll Rancher, LRT/CTRS 10/12/2021 4:00 PM

## 2021-10-12 NOTE — Progress Notes (Signed)
[facility] MD Progress Note  10/12/2021 2:38 PM Shari [name]  MRN:  [id number] Subjective:   The patient is a 65 year old female with a past psychiatric history of unspecified psychotic disorder presenting under IVC for paranoia and delusions. She believes that there are people in the woods near her house that have tried to shoot her.   The patient's chart was reviewed and nursing notes were reviewed. Over the past 24 hrs, there were no documented behavioral issues, no PRN medications given for agitation.The patient's case was discussed in multidisciplinary team meeting. Of note, the patient was moved off of the 500 hall yesterday.   The care team made the following medication changes yesterday: -Increase Risperdal to 1 mg AM / 2 mg PM  On interview this morning, the patient may exhibit some slight improvement in her paranoia and delusions, some of which may be related to her recent move to a more tranquil environment or titration of Risperdal.  The patient reports having no problem eating food that the staff gives her.  Previously the patient expressed paranoia about this.  She reports feeling safe in her current environment, not believing that there are people outside the hospital watching her.  She continues to believe that there are people in the woods at her sister's house, but she appears less sure of this and says that this is "probably" the case.  She denies suicidal thoughts and auditory/visual hallucinations as well as first rank symptoms.  A blind MoCA was performed yesterday, given the patient's visual impairment.  She scored a 16, with the normal range being 18 and above. Treatment with a cognitive enhancer, specifically donepezil, was discussed with her, including risk/benefits and side effects.  She reports that she desires more time to decide whether she wants to start this medication or not.  Principal Problem: Acute psychosis ([place]) Diagnosis: Principal Problem:   Acute psychosis  ([place])  Total Time Spent in Direct Patient Care: I personally spent 30 minutes on the unit in direct patient care. The direct patient care time included face-to-face time with the patient, reviewing the patient's chart, communicating with other professionals, and coordinating care. Greater than 50% of this time was spent in counseling or coordinating care with the patient regarding goals of hospitalization, psycho-education, and discharge planning needs.   Past Psychiatric History: unspecified psychotic disorder  Past Medical History:  Past Medical History:  Diagnosis Date   Eye globe prosthesis    GERD (gastroesophageal reflux disease)    History of blood transfusion 1973   "related to abscess burst in my stomach"   Hyperlipemia    Hyperlipidemia    Hypertension    Osteoarthritis of back    Lowerback    SVD (spontaneous vaginal delivery)    x 1, baby died at 2 wks of age   Type II diabetes mellitus ([place])     Past Surgical History:  Procedure Laterality Date   APPENDECTOMY     COLONOSCOPY     DILATATION & CURETTAGE/HYSTEROSCOPY WITH MYOSURE N/A 02/25/2015   Procedure: DILATATION & CURETTAGE/HYSTEROSCOPY WITH MYOSURE;  Surgeon: Marylynn Pearson, MD;  Location: [facility];  Service: Gynecology;  Laterality: N/A;   DILATION AND CURETTAGE OF UTERUS     ENUCLEATION Right 03/16/2017   ENUCLEATION Right 03/16/2017   Procedure: ENUCLEATION RIGHT EYE;  Surgeon: Clista Bernhardt, MD;  Location: [facility];  Service: Ophthalmology;  Laterality: Right;   EYE SURGERY     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6, 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  EYE SURGERY Right ~ 1974   "S/P initial eye injury; scissors stuck in my eye""   LAPAROSCOPIC CHOLECYSTECTOMY     SHOULDER ARTHROSCOPY WITH ROTATOR CUFF REPAIR Left    wire stitches per patient   SHOULDER ARTHROSCOPY WITH ROTATOR CUFF REPAIR Right    Family History:  Family History  Problem Relation Age of Onset   Diabetes Mother    CAD Mother    Lung cancer Father    Family  Psychiatric  History: unknown Social History:  Social History   Substance and Sexual Activity  Alcohol Use Yes   Comment: 03/16/2017 "nothing since 2013"     Social History   Substance and Sexual Activity  Drug Use No    Social History   Socioeconomic History   Marital status: Widowed    Spouse name: Not on file   Number of children: Not on file   Years of education: Not on file   Highest education level: Not on file  Occupational History   Not on file  Tobacco Use   Smoking status: Former    Packs/day: 0.50    Years: 40.00    Pack years: 20.00    Types: Cigarettes    Quit date: 2016    Years since quitting: 6.8   Smokeless tobacco: Never  Vaping Use   Vaping Use: Never used  Substance and Sexual Activity   Alcohol use: Yes    Comment: 03/16/2017 "nothing since 2013"   Drug use: No   Sexual activity: Not Currently    Birth control/protection: Post-menopausal  Other Topics Concern   Not on file  Social History Narrative   Not on file   Social Determinants of Health   Financial Resource Strain: Not on file  Food Insecurity: Not on file  Transportation Needs: Not on file  Physical Activity: Not on file  Stress: Not on file  Social Connections: Not on file   Additional Social History:        Sleep: Good  Appetite:  Fair  Current Medications: Current Facility-Administered Medications  Medication Dose Route Frequency Provider Last Rate Last Admin   acetaminophen (TYLENOL) tablet 650 mg  650 mg Oral Q6H PRN Derrill Center, NP       alum & mag hydroxide-simeth (MAALOX/MYLANTA) 200-200-20 MG/5ML suspension 30 mL  30 mL Oral Q4H PRN Derrill Center, NP       atenolol (TENORMIN) tablet 12.5 mg  12.5 mg Oral Daily Corky Sox, MD   12.5 mg at 10/12/21 0819   atorvastatin (LIPITOR) tablet 40 mg  40 mg Oral Daily Derrill Center, NP   40 mg at 10/12/21 0819   divalproex (DEPAKOTE) DR tablet 500 mg  500 mg Oral QHS Derrill Center, NP   500 mg at 10/11/21  2300   feeding supplement (BOOST / RESOURCE BREEZE) liquid 1 Container  1 Container Oral BID BM Massengill, Ovid Curd, MD   1 Container at 10/12/21 1124   hydrOXYzine (ATARAX/VISTARIL) tablet 25 mg  25 mg Oral TID PRN Derrill Center, NP   25 mg at 10/11/21 1309   ibuprofen (ADVIL) tablet 600 mg  600 mg Oral TID PRN Bobbitt, Shalon E, NP   600 mg at 10/12/21 0819   lisinopril (ZESTRIL) tablet 10 mg  10 mg Oral Daily Corky Sox, MD   10 mg at 10/12/21 0820   magnesium hydroxide (MILK OF MAGNESIA) suspension 30 mL  30 mL Oral Daily PRN Derrill Center, NP  methimazole (TAPAZOLE) tablet 10 mg  10 mg Oral Daily Corky Sox, MD   10 mg at 10/12/21 0819   pantoprazole (PROTONIX) EC tablet 40 mg  40 mg Oral BID AC Merrily Brittle, DO   40 mg at 10/12/21 [id number]   risperiDONE (RISPERDAL) tablet 1 mg  1 mg Oral Daily Corky Sox, MD   1 mg at 10/12/21 [id number]   risperiDONE (RISPERDAL) tablet 2 mg  2 mg Oral QHS Corky Sox, MD   2 mg at 10/11/21 2300   traZODone (DESYREL) tablet 50 mg  50 mg Oral QHS PRN Derrill Center, NP   50 mg at 10/08/21 2052    Lab Results:  Results for orders placed or performed during the hospital encounter of 10/07/21 (from the past 48 hour(s))  Valproic acid level     Status: Abnormal   Collection Time: 10/10/21  6:44 PM  Result Value Ref Range   Valproic Acid Lvl 30 (L) 50.0 - 100.0 ug/mL    Comment: Performed at [facility], [place] [address] [place], [place] [address]  Comprehensive metabolic panel     Status: Abnormal   Collection Time: 10/11/21  6:36 AM  Result Value Ref Range   Sodium 139 135 - 145 mmol/L   Potassium 4.5 3.5 - 5.1 mmol/L   Chloride 105 98 - 111 mmol/L   CO2 24 22 - 32 mmol/L   Glucose, Bld 102 (H) 70 - 99 mg/dL    Comment: Glucose reference range applies only to samples taken after fasting for at least 8 hours.   BUN 15 8 - 23 mg/dL   Creatinine, Ser 0.88 0.44 - 1.00 mg/dL   Calcium 9.8 8.9 - 10.3 mg/dL   Total  Protein 7.1 6.5 - 8.1 g/dL   Albumin 3.7 3.5 - 5.0 g/dL   AST 20 15 - 41 U/L   ALT 9 0 - 44 U/L   Alkaline Phosphatase 92 38 - 126 U/L   Total Bilirubin 0.5 0.3 - 1.2 mg/dL   GFR, Estimated >60 >60 mL/min    Comment: (NOTE) Calculated using the CKD-EPI Creatinine Equation (2021)    Anion gap 10 5 - 15    Comment: Performed at [facility], [place] [address], [place], [place] [address]    Blood Alcohol level:  Lab Results  Component Value Date   [facility] <10 10/07/2021   ETH <10 99991111    Metabolic Disorder Labs: Lab Results  Component Value Date   HGBA1C 5.6 10/07/2021   MPG 114.02 10/07/2021   No results found for: PROLACTIN Lab Results  Component Value Date   CHOL 253 (H) 10/07/2021   TRIG 99 10/07/2021   HDL 56 10/07/2021   CHOLHDL 4.5 10/07/2021   VLDL 20 10/07/2021   LDLCALC 177 (H) 10/07/2021    Physical Findings: AIMS: not yet assessed  Musculoskeletal: Strength & Muscle Tone: within normal limits Gait & Station:  not assessed Patient leans: NA  Psychiatric Specialty Exam:  Presentation  General Appearance: appropriate for environment Eye Contact:Fleeting  Speech:Normal Rate  Speech Volume:Normal  Handedness:Right  Mood and Affect  Mood: "good" Affect: constricted  Thought Process  Thought Processes:Goal Directed  Descriptions of Associations:Circumstantial  Orientation: oriented to person place, time and event  Thought Content:Delusions; Illogical; Paranoid Ideation; Tangential  History of Schizophrenia/Schizoaffective disorder:Yes  Duration of Psychotic Symptoms:Greater than six months  Hallucinations: denies  Ideas of Reference:Delusions; Paranoia  Suicidal Thoughts:No data recorded  Homicidal Thoughts:No data recorded  Sensorium  Memory:Immediate Fair; Recent Fair; Remote Fair  Judgment:Impaired  Insight:Lacking   Executive Functions  Concentration:Fair  Attention Span:Fair  Recall:Fair  [employer]  of Knowledge:Fair  Language:Fair   Psychomotor Activity  Psychomotor Activity:No data recorded   Assets  Assets:Communication Skills; Desire for Improvement   Sleep  Sleep: fair Patient slept Number of Hours: 6.75   Physical Exam: Physical Exam Vitals reviewed.  Constitutional:      Appearance: [name] not ill-appearing or toxic-appearing.  Eyes:     Extraocular Movements: Extraocular movements intact.  Pulmonary:     Effort: Pulmonary effort is normal.  Musculoskeletal:        General: Normal range of motion.     Cervical back: Normal range of motion.  Neurological:     General: No focal deficit present.     Mental Status: [name] alert.   Review of Systems  Respiratory:  Negative for shortness of breath.   Cardiovascular:  Negative for chest pain.  Gastrointestinal:  Negative for nausea and vomiting.  Blood pressure 136/60, pulse 60, temperature 97.6 F (36.4 C), resp. rate 16, height 5\' 3"  (1.6 m), weight 60.8 kg, SpO2 100 %. Body mass index is 23.74 kg/m.   Treatment Plan Summary: Daily contact with patient to assess and evaluate symptoms and progress in treatment and Medication management  Assessment Schizophrenia   Psychosis -Continue Risperdal 1 mg / 2 mg QHS -Continue home Depakote 500 mg for mood stabilization             -Depakote level of 0 on admission  -Repeat Depakote level of 30  -Given that the patient does not have significant mood lability or persistent irritability will continue at current dose Antipsychotic labs             EKG: sinus brady, some inverted T-waves, discussed with ED attending, Qtc of 427             Lipids: chol of 253, LDL 177 (collected at 2 AM)             A1C: 5.6 AIMS: not yet assessed   Medical Management Covid negative CMP: unremarkable CBC: unremarkable EtOH: <10 UDS: negative Upreg: NA TSH: 1.2 UA: leukocytes, negative nitrites   Potential UTI Patient denies symptoms, however, given UA and psychosis will  continue to treat. Patient has been refusing Cipro consistently, saying that she does not have the symptoms of a UTI.  -Cipro 250 mg BID for 3 days -Follow-up repeat UA  HTN -Home lisinopril 10 mg  Hyperthyroidism A. fib Verified in endocrinology notes. Patient last filled her 63 d supply of methimazole on 7/6 and ran out one month ago. -Continue methimazole -Continue atenolol   Sciatica Offered patient gabapentin or Lyrica, however she says that she tried in the past and they have been ineffective. Discussed with patient trial with lidocaine patch to see if there is any relief. -Lidocaine patch  GERD -Protonix  9/6, MD Psychiatry Resident, PGY-1 Carlyn Reichert [facility] 10/12/2021, 2:38 PM

## 2021-10-13 ENCOUNTER — Encounter (HOSPITAL_COMMUNITY): Payer: Self-pay

## 2021-10-13 DIAGNOSIS — F23 Brief psychotic disorder: Secondary | ICD-10-CM | POA: Diagnosis not present

## 2021-10-13 MED ORDER — RISPERIDONE 0.5 MG PO TABS: 0.5000 mg | ORAL_TABLET | Freq: Every day | ORAL | Status: DC

## 2021-10-13 MED ORDER — RISPERIDONE 0.5 MG PO TABS
0.5000 mg | ORAL_TABLET | Freq: Every day | ORAL | Status: AC
  Administered 2021-10-13: 0.5 mg via ORAL
  Filled 2021-10-13 (×2): qty 1

## 2021-10-13 MED ORDER — RISPERIDONE 2 MG PO TABS
2.0000 mg | ORAL_TABLET | Freq: Every day | ORAL | Status: DC
  Administered 2021-10-14: 2 mg via ORAL
  Filled 2021-10-13 (×3): qty 1

## 2021-10-13 NOTE — Progress Notes (Signed)
[facility] MD Progress Note  10/13/2021 6:57 PM Shari [name]  MRN:  [id number] Subjective:   The patient is a 65 year old female with a past psychiatric history of unspecified psychotic disorder presenting under IVC for paranoia and delusions. She believes that there are people in the woods near her house that have tried to shoot her.   The patient's chart was reviewed and nursing notes were reviewed. Over the past 24 hrs, there were no documented behavioral issues, no PRN medications given for agitation.The patient's case was discussed in multidisciplinary team meeting.  The care team made the following medication changes yesterday: -Continue Risperdal 1 mg daily and 2 mg QHS  On interview this morning, the patient appears similar to yesterday, exhibiting stable paranoia and delusions. She reports no problem eating her food, but when asked if she feels her food is ever tampered with, she reports "not recently". She reports feeling safe presently and does not feel like she is being watched, but states that there are likely people in the woods near her sister's house. She refuses to take a cognitive enhancing agent, saying that she does not feel like she needs a new medicine. She denies suicidal thoughts and auditory/visual hallucinations.   Principal Problem: Acute psychosis ([place]) Diagnosis: Principal Problem:   Acute psychosis ([place])  Total Time Spent in Direct Patient Care: I personally spent 30 minutes on the unit in direct patient care. The direct patient care time included face-to-face time with the patient, reviewing the patient's chart, communicating with other professionals, and coordinating care. Greater than 50% of this time was spent in counseling or coordinating care with the patient regarding goals of hospitalization, psycho-education, and discharge planning needs.   Past Psychiatric History: unspecified psychotic disorder  Past Medical History:  Past Medical History:  Diagnosis Date    Eye globe prosthesis    GERD (gastroesophageal reflux disease)    History of blood transfusion 1973   "related to abscess burst in my stomach"   Hyperlipemia    Hyperlipidemia    Hypertension    Osteoarthritis of back    Lowerback    SVD (spontaneous vaginal delivery)    x 1, baby died at 2 wks of age   Type II diabetes mellitus ([place])     Past Surgical History:  Procedure Laterality Date   APPENDECTOMY     COLONOSCOPY     DILATATION & CURETTAGE/HYSTEROSCOPY WITH MYOSURE N/A 02/25/2015   Procedure: DILATATION & CURETTAGE/HYSTEROSCOPY WITH MYOSURE;  Surgeon: Marylynn Pearson, MD;  Location: [facility];  Service: Gynecology;  Laterality: N/A;   DILATION AND CURETTAGE OF UTERUS     ENUCLEATION Right 03/16/2017   ENUCLEATION Right 03/16/2017   Procedure: ENUCLEATION RIGHT EYE;  Surgeon: Clista Bernhardt, MD;  Location: [facility];  Service: Ophthalmology;  Laterality: Right;   EYE SURGERY     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6, 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   EYE SURGERY Right ~ 1974   "S/P initial eye injury; scissors stuck in my eye""   LAPAROSCOPIC CHOLECYSTECTOMY     SHOULDER ARTHROSCOPY WITH ROTATOR CUFF REPAIR Left    wire stitches per patient   SHOULDER ARTHROSCOPY WITH ROTATOR CUFF REPAIR Right    Family History:  Family History  Problem Relation Age of Onset   Diabetes Mother    CAD Mother    Lung cancer Father    Family Psychiatric  History: unknown Social History:  Social History   Substance and Sexual Activity  Alcohol Use Yes  Comment: 03/16/2017 "nothing since 2013"     Social History   Substance and Sexual Activity  Drug Use No    Social History   Socioeconomic History   Marital status: Widowed    Spouse name: Not on file   Number of children: Not on file   Years of education: Not on file   Highest education level: Not on file  Occupational History   Not on file  Tobacco Use   Smoking status: Former    Packs/day: 0.50    Years: 40.00    Pack years: 20.00    Types:  Cigarettes    Quit date: 2016    Years since quitting: 6.8   Smokeless tobacco: Never  Vaping Use   Vaping Use: Never used  Substance and Sexual Activity   Alcohol use: Yes    Comment: 03/16/2017 "nothing since 2013"   Drug use: No   Sexual activity: Not Currently    Birth control/protection: Post-menopausal  Other Topics Concern   Not on file  Social History Narrative   Not on file   Social Determinants of Health   Financial Resource Strain: Not on file  Food Insecurity: Not on file  Transportation Needs: Not on file  Physical Activity: Not on file  Stress: Not on file  Social Connections: Not on file   Additional Social History:        Sleep: Good  Appetite:  Fair  Current Medications: Current Facility-Administered Medications  Medication Dose Route Frequency Provider Last Rate Last Admin   acetaminophen (TYLENOL) tablet 650 mg  650 mg Oral Q6H PRN Oneta Rack, NP       alum & mag hydroxide-simeth (MAALOX/MYLANTA) 200-200-20 MG/5ML suspension 30 mL  30 mL Oral Q4H PRN Oneta Rack, NP       atenolol (TENORMIN) tablet 12.5 mg  12.5 mg Oral Daily Carlyn Reichert, MD   12.5 mg at 10/13/21 0949   atorvastatin (LIPITOR) tablet 40 mg  40 mg Oral Daily Oneta Rack, NP   40 mg at 10/13/21 0948   divalproex (DEPAKOTE) DR tablet 500 mg  500 mg Oral QHS Oneta Rack, NP   500 mg at 10/12/21 2140   feeding supplement (BOOST / RESOURCE BREEZE) liquid 1 Container  1 Container Oral BID BM Phineas Inches, MD   1 Container at 10/13/21 1418   hydrOXYzine (ATARAX/VISTARIL) tablet 25 mg  25 mg Oral TID PRN Oneta Rack, NP   25 mg at 10/11/21 1309   ibuprofen (ADVIL) tablet 600 mg  600 mg Oral TID PRN Bobbitt, Shalon E, NP   600 mg at 10/13/21 0949   lisinopril (ZESTRIL) tablet 10 mg  10 mg Oral Daily Carlyn Reichert, MD   10 mg at 10/13/21 0947   magnesium hydroxide (MILK OF MAGNESIA) suspension 30 mL  30 mL Oral Daily PRN Oneta Rack, NP       methimazole  (TAPAZOLE) tablet 10 mg  10 mg Oral Daily Carlyn Reichert, MD   10 mg at 10/13/21 0947   pantoprazole (PROTONIX) EC tablet 40 mg  40 mg Oral BID AC Princess Bruins, DO   40 mg at 10/13/21 1751   risperiDONE (RISPERDAL) tablet 2 mg  2 mg Oral QHS Carlyn Reichert, MD   2 mg at 10/12/21 2139   [START ON 10/14/2021] risperiDONE (RISPERDAL) tablet 2 mg  2 mg Oral Daily Carlyn Reichert, MD       traZODone (DESYREL) tablet 50 mg  50 mg  Oral QHS PRN Derrill Center, NP   50 mg at 10/08/21 2052    Lab Results:  No results found for this or any previous visit (from the past 48 hour(s)).   Blood Alcohol level:  Lab Results  Component Value Date   ETH <10 10/07/2021   ETH <10 99991111    Metabolic Disorder Labs: Lab Results  Component Value Date   HGBA1C 5.6 10/07/2021   MPG 114.02 10/07/2021   No results found for: PROLACTIN Lab Results  Component Value Date   CHOL 253 (H) 10/07/2021   TRIG 99 10/07/2021   HDL 56 10/07/2021   CHOLHDL 4.5 10/07/2021   VLDL 20 10/07/2021   LDLCALC 177 (H) 10/07/2021    Physical Findings: AIMS: not yet assessed  Musculoskeletal: Strength & Muscle Tone: within normal limits Gait & Station:  not assessed Patient leans: NA  Psychiatric Specialty Exam:  Presentation  General Appearance: appropriate for environment Eye Contact:Fleeting  Speech:Normal Rate  Speech Volume:Normal  Handedness:Right  Mood and Affect  Mood: "okay" Affect: constricted  Thought Process  Thought Processes:Goal Directed  Descriptions of Associations:Circumstantial  Orientation: oriented to person place, time and event  Thought Content:Delusions; Illogical; Paranoid Ideation; Tangential  History of Schizophrenia/Schizoaffective disorder:Yes  Duration of Psychotic Symptoms:Greater than six months  Hallucinations: denies  Ideas of Reference:Delusions; Paranoia  Suicidal Thoughts: denies  Homicidal Thoughts: none reported   Sensorium  Memory:Immediate  Fair; Recent Fair; Remote [place]   Executive Functions  Concentration:Fair  Attention Span:Fair  [place]   Psychomotor Activity  Psychomotor Activity:No data recorded   Assets  Assets:Communication Skills; Desire for Improvement   Sleep  Sleep: fair Patient slept Number of Hours: 5.25   Physical Exam: Physical Exam Vitals reviewed.  Constitutional:      Appearance: [name] not ill-appearing or toxic-appearing.  Eyes:     Extraocular Movements: Extraocular movements intact.  Pulmonary:     Effort: Pulmonary effort is normal.  Musculoskeletal:        General: Normal range of motion.     Cervical back: Normal range of motion.  Neurological:     General: No focal deficit present.     Mental Status: [name] alert.   Review of Systems  Respiratory:  Negative for shortness of breath.   Cardiovascular:  Negative for chest pain.  Gastrointestinal:  Negative for nausea and vomiting.  Blood pressure (!) 129/55, pulse 69, temperature 98 F (36.7 C), temperature source Oral, resp. rate 16, height 5\' 3"  (1.6 m), weight 60.8 kg, SpO2 100 %. Body mass index is 23.74 kg/m.   Treatment Plan Summary: Daily contact with patient to assess and evaluate symptoms and progress in treatment and Medication management  Assessment Schizophrenia   Psychosis -For today: Risperdal 1 mg at 0800, 0.5 mg this afternoon, and 2 mg QHS -Tomorrow: increase to Risperdal 2 mg AM and 2 mg QHS -Continue home Depakote 500 mg for mood stabilization             -Depakote level of 0 on admission  -Repeat Depakote level of 30  -Given that the patient does not have significant mood lability or persistent irritability will continue at current dose Antipsychotic labs             EKG: sinus brady, some inverted T-waves, discussed with ED attending, Qtc of 427             Lipids: chol of 253, LDL  177 (collected at 2 AM)              A1C: 5.6 AIMS: not yet assessed   Medical Management Covid negative CMP: unremarkable CBC: unremarkable EtOH: <10 UDS: negative Upreg: NA TSH: 1.2 UA: leukocytes, negative nitrites   Potential UTI Patient denies symptoms, however, given UA and psychosis will continue to treat. Patient has been refusing Cipro consistently, saying that she does not have the symptoms of a UTI.  -Cipro 250 mg BID for 3 days -Follow-up repeat UA  HTN -Home lisinopril 10 mg  Hyperthyroidism A. fib Verified in endocrinology notes. Patient last filled her 52 d supply of methimazole on 7/6 and ran out one month ago. -Continue methimazole -Continue atenolol   Sciatica Offered patient gabapentin or Lyrica, however she says that she tried in the past and they have been ineffective. Discussed with patient trial with lidocaine patch to see if there is any relief. -Lidocaine patch  GERD -Protonix  Corky Sox, MD Psychiatry Resident, PGY-1 [facility] 10/13/2021, 6:57 PM

## 2021-10-13 NOTE — BH IP Treatment Plan (Signed)
Interdisciplinary Treatment and Diagnostic Plan Update  10/13/2021 Time of Session: 10:40am  Shari [name] MRN: [id number]  Principal Diagnosis: Acute psychosis [facility])  Secondary Diagnoses: Principal Problem:   Acute psychosis (HCC)   Current Medications:  Current Facility-Administered Medications  Medication Dose Route Frequency Provider Last Rate Last Admin   acetaminophen (TYLENOL) tablet 650 mg  650 mg Oral Q6H PRN Oneta Rack, NP       alum & mag hydroxide-simeth (MAALOX/MYLANTA) 200-200-20 MG/5ML suspension 30 mL  30 mL Oral Q4H PRN Oneta Rack, NP       atenolol (TENORMIN) tablet 12.5 mg  12.5 mg Oral Daily Carlyn Reichert, MD   12.5 mg at 10/13/21 0949   atorvastatin (LIPITOR) tablet 40 mg  40 mg Oral Daily Oneta Rack, NP   40 mg at 10/13/21 0948   divalproex (DEPAKOTE) DR tablet 500 mg  500 mg Oral QHS Oneta Rack, NP   500 mg at 10/12/21 2140   feeding supplement (BOOST / RESOURCE BREEZE) liquid 1 Container  1 Container Oral BID BM Phineas Inches, MD   1 Container at 10/13/21 1418   hydrOXYzine (ATARAX/VISTARIL) tablet 25 mg  25 mg Oral TID PRN Oneta Rack, NP   25 mg at 10/11/21 1309   ibuprofen (ADVIL) tablet 600 mg  600 mg Oral TID PRN Bobbitt, Shalon E, NP   600 mg at 10/13/21 0949   lisinopril (ZESTRIL) tablet 10 mg  10 mg Oral Daily Carlyn Reichert, MD   10 mg at 10/13/21 0947   magnesium hydroxide (MILK OF MAGNESIA) suspension 30 mL  30 mL Oral Daily PRN Oneta Rack, NP       methimazole (TAPAZOLE) tablet 10 mg  10 mg Oral Daily Carlyn Reichert, MD   10 mg at 10/13/21 0947   pantoprazole (PROTONIX) EC tablet 40 mg  40 mg Oral BID AC Princess Bruins, DO   40 mg at 10/13/21 0947   risperiDONE (RISPERDAL) tablet 2 mg  2 mg Oral QHS Carlyn Reichert, MD   2 mg at 10/12/21 2139   [START ON 10/14/2021] risperiDONE (RISPERDAL) tablet 2 mg  2 mg Oral Daily Carlyn Reichert, MD       traZODone (DESYREL) tablet 50 mg  50 mg Oral QHS PRN Oneta Rack, NP    50 mg at 10/08/21 2052   PTA Medications: Medications Prior to Admission  Medication Sig Dispense Refill Last Dose   atenolol (TENORMIN) 25 MG tablet Take 0.5 tablets (12.5 mg total) by mouth daily.      atorvastatin (LIPITOR) 40 MG tablet Take 40 mg by mouth daily.      ciprofloxacin (CIPRO) 500 MG tablet Take 1 tablet (500 mg total) by mouth 2 (two) times daily.      divalproex (DEPAKOTE ER) 250 MG 24 hr tablet Take 750 mg by mouth at bedtime.      ibuprofen (ADVIL) 800 MG tablet Take 800 mg by mouth 3 (three) times daily.      lisinopril (PRINIVIL,ZESTRIL) 10 MG tablet Take 10 mg by mouth daily.       methimazole (TAPAZOLE) 10 MG tablet Take 10 mg by mouth daily.      pantoprazole (PROTONIX) 40 MG tablet Take 40 mg by mouth 2 (two) times daily.       risperiDONE (RISPERDAL) 1 MG tablet Take 1 tablet (1 mg total) by mouth at bedtime.      traZODone (DESYREL) 50 MG tablet Take 1 tablet (50 mg  total) by mouth at bedtime as needed for sleep.       Patient Stressors: Marital or family conflict   Medication change or noncompliance    Patient Strengths: Physical Health  Religious Affiliation  Supportive family/friends   Treatment Modalities: Medication Management, Group therapy, Case management,  1 to 1 session with clinician, Psychoeducation, Recreational therapy.   Physician Treatment Plan for Primary Diagnosis: Acute psychosis ([place]) Long Term Goal(s): Improvement in symptoms so as ready for discharge   Short Term Goals: Ability to maintain clinical measurements within normal limits will improve Compliance with prescribed medications will improve  Medication Management: Evaluate patient's response, side effects, and tolerance of medication regimen.  Therapeutic Interventions: 1 to 1 sessions, Unit Group sessions and Medication administration.  Evaluation of Outcomes: Progressing  Physician Treatment Plan for Secondary Diagnosis: Principal Problem:   Acute psychosis  ([place])  Long Term Goal(s): Improvement in symptoms so as ready for discharge   Short Term Goals: Ability to maintain clinical measurements within normal limits will improve Compliance with prescribed medications will improve     Medication Management: Evaluate patient's response, side effects, and tolerance of medication regimen.  Therapeutic Interventions: 1 to 1 sessions, Unit Group sessions and Medication administration.  Evaluation of Outcomes: Progressing   RN Treatment Plan for Primary Diagnosis: Acute psychosis ([place]) Long Term Goal(s): Knowledge of disease and therapeutic regimen to maintain health will improve  Short Term Goals: Ability to remain free from injury will improve, Ability to participate in decision making will improve, Ability to verbalize feelings will improve, Ability to disclose and discuss suicidal ideas, and Ability to identify and develop effective coping behaviors will improve  Medication Management: RN will administer medications as ordered by provider, will assess and evaluate patient's response and provide education to patient for prescribed medication. RN will report any adverse and/or side effects to prescribing provider.  Therapeutic Interventions: 1 on 1 counseling sessions, Psychoeducation, Medication administration, Evaluate responses to treatment, Monitor vital signs and CBGs as ordered, Perform/monitor CIWA, COWS, AIMS and Fall Risk screenings as ordered, Perform wound care treatments as ordered.  Evaluation of Outcomes: Progressing   LCSW Treatment Plan for Primary Diagnosis: Acute psychosis ([place]) Long Term Goal(s): Safe transition to appropriate next level of care at discharge, Engage patient in therapeutic group addressing interpersonal concerns.  Short Term Goals: Engage patient in aftercare planning with referrals and resources, Increase social support, Increase emotional regulation, Facilitate acceptance of mental health diagnosis and concerns,  Identify triggers associated with mental health/substance abuse issues, and Increase skills for wellness and recovery  Therapeutic Interventions: Assess for all discharge needs, 1 to 1 time with Social worker, Explore available resources and support systems, Assess for adequacy in community support network, Educate family and significant other(s) on suicide prevention, Complete Psychosocial Assessment, Interpersonal group therapy.  Evaluation of Outcomes: Progressing   Progress in Treatment: Attending groups: No. Participating in groups: No. Taking medication as prescribed: Yes. Toleration medication: Yes. Family/Significant other contact made: No, will contact:  [place] Contacts  Patient understands diagnosis: No. Discussing patient identified problems/goals with staff: Yes. Medical problems stabilized or resolved: Yes. Denies suicidal/homicidal ideation: Yes. Issues/concerns per patient self-inventory: No.   New problem(s) identified: No, Describe:  None   New Short Term/Long Term Goal(s): medication stabilization, elimination of SI thoughts, development of comprehensive mental wellness plan.   Patient Goals: Did not attend   Discharge Plan or Barriers: Patient recently admitted. CSW will continue to follow and assess for appropriate referrals and possible discharge  planning.   Reason for Continuation of Hospitalization: Anxiety Depression Medication stabilization  Estimated Length of Stay: 3 to 5 days    Scribe for Treatment Team: Darleen Crocker, Latanya Presser 10/13/2021 3:20 PM

## 2021-10-13 NOTE — Progress Notes (Signed)
Patient did not attend wrap up group. 

## 2021-10-13 NOTE — Progress Notes (Addendum)
   10/13/21 1200  Psych Admission Type (Psych Patients Only)  Admission Status Involuntary  Psychosocial Assessment  Patient Complaints None  Eye Contact Fair  Facial Expression Pensive  Affect Anxious;Appropriate to circumstance  Speech Logical/coherent  Interaction Assertive  Motor Activity Slow  Appearance/Hygiene Unremarkable  Behavior Characteristics Cooperative  Mood Pleasant;Sad  Aggressive Behavior  Effect No apparent injury  Thought Process  Coherency Concrete thinking  Content WDL  Delusions None reported or observed  Perception WDL  Hallucination None reported or observed  Judgment Poor  Confusion None  Danger to Self  Current suicidal ideation? Denies  Danger to Others  Danger to Others None reported or observed   D. Pt presents as quiet, minimal- smiles during interactions. Pt continues to isolate, and has not attended groups despite encouragement from the staff.  Pt currently denies SI/HI and AVH and does not appear to be responding to internal stimuli.   A. Labs and vitals monitored. Pt given and educated on medications. Pt supported emotionally and encouraged to express concerns and ask questions.   R. Pt remains safe with 15 minute checks. Will continue POC.

## 2021-10-13 NOTE — Progress Notes (Signed)
Pt did not attend group. 

## 2021-10-13 NOTE — Group Note (Signed)
LCSW Group Therapy Note   Group Date: 10/13/2021 Start Time: 1300 End Time: 1400   Type of Therapy and Topic:  Group Therapy:   Participation Level:  Did Not Attend  Description of Group:Group encouraged increased engagement and participation through discussion focused on Safety Planning. Patients worked both individually and collaboratively to create and discuss the different elements of a safety plan, including identifying warning signs, coping skills, professional supports, people you can ask for help, how to make the environment safe, and reasons for life worth living. Remainder of group was spent filling out individual safety plans to be placed in patient charts.   Therapeutic Goal(s): Identify warning signs and triggers Identify positive coping strategies Identify professional and personal supports when experiencing a mental health crisis Identify ways in which you can make the environment safe Identify reasons for life worth living Identify the steps to completing a safety plan and provide education on completing a safety plan at discharge   Summary of Patient Progress:  Did Not Attend   Felizardo Hoffmann, Theresia Majors 10/13/2021  1:32 PM

## 2021-10-13 NOTE — Progress Notes (Signed)
Pt continues to isolate to room, pt stated she was feeling a little better    10/13/21 2200  Psych Admission Type (Psych Patients Only)  Admission Status Involuntary  Psychosocial Assessment  Patient Complaints None  Eye Contact Fair  Facial Expression Pensive  Affect Anxious;Appropriate to circumstance  Speech Logical/coherent  Interaction Assertive  Motor Activity Slow  Appearance/Hygiene Unremarkable  Behavior Characteristics Cooperative  Mood Sad  Aggressive Behavior  Effect No apparent injury  Thought Process  Coherency Concrete thinking  Content WDL  Delusions None reported or observed  Perception WDL  Hallucination None reported or observed  Judgment Poor  Confusion None  Danger to Self  Current suicidal ideation? Denies  Danger to Others  Danger to Others None reported or observed

## 2021-10-13 NOTE — Progress Notes (Signed)
Recreation Therapy Notes  Date: 11.9.22 Time: 0930-1000 Location: [address] Dayroom  Group Topic: Stress Management   Goal Area(s) Addresses:  Patient will actively participate in stress management techniques presented during session.  Patient will successfully identify benefit of practicing stress management post d/c.   Intervention: Relaxation exercise with ambient sound and script   Activity: Guided Imagery. LRT provided education, instruction, and demonstration on practice of visualization via guided imagery. Patient was asked to participate in the technique introduced during session. LRT debriefed including topics of mindfulness, stress management and specific scenarios each patient could use these techniques. Patients were given suggestions of ways to access scripts post d/c and encouraged to explore Youtube and other apps available on smartphones, tablets, and computers.  Education:  Stress Management, Discharge Planning.   Clinical Observations/Feedback: Group did not occur due to orientation group going over 20 minutes.  Packets were given with information on ways to manage stress, techniques that can be used, proper breathing techniques and how to properly do the techniques given such as progressive muscle relaxation, diaphragmatic breathing and mindfulness meditation.     Caroll Rancher, LRT, CTRS        Lillia Abed, Yusra Ravert A 10/13/2021 12:07 PM

## 2021-10-14 DIAGNOSIS — F23 Brief psychotic disorder: Secondary | ICD-10-CM | POA: Diagnosis not present

## 2021-10-14 MED ORDER — RISPERIDONE 1 MG PO TABS
1.0000 mg | ORAL_TABLET | Freq: Every day | ORAL | Status: DC
  Administered 2021-10-15 – 2021-10-20 (×6): 1 mg via ORAL
  Filled 2021-10-14 (×10): qty 1

## 2021-10-14 NOTE — [facility] Group Notes (Signed)
PsychoEducational group completed. Patients were asked to read ''There's a hole in my sidewalk by Franky Macho'' and identify negative behavioral patterns in which that has affected their mental health. Pt sat in group, did not participate, was guarded and could not contribute even when called upon.

## 2021-10-14 NOTE — [facility] Group Notes (Signed)
Relaxation and Psycho-ed Group were combined. Healthy coping skills were discussed as it attributes to promoting mental well being. Discussed with patients that mental health journey is like a pie, and that healthy coping skills add value to mental well being. Identified unhealthy coping skills and how they play negative impact, and identified positive healthy coping skills that are free, accessible and easily implemented. Closed group with deep breathing practice and education regarding stress reduction.  Pt did not attend.  

## 2021-10-14 NOTE — Progress Notes (Signed)
   10/14/21 1900  Psych Admission Type (Psych Patients Only)  Admission Status Involuntary  Psychosocial Assessment  Patient Complaints None  Eye Contact Fair  Facial Expression Pensive  Affect Anxious;Appropriate to circumstance  Speech Logical/coherent  Interaction Assertive  Motor Activity Slow  Appearance/Hygiene Unremarkable  Behavior Characteristics Cooperative  Mood Sad  Aggressive Behavior  Effect No apparent injury  Thought Process  Coherency Concrete thinking  Content WDL  Delusions None reported or observed  Perception WDL  Hallucination None reported or observed  Judgment Poor  Confusion None  Danger to Self  Current suicidal ideation? Denies  Danger to Others  Danger to Others None reported or observed
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
## 2021-10-14 NOTE — Progress Notes (Addendum)
[facility] MD Progress Note  10/14/2021 1:56 PM Shari [name]  MRN:  [id number] Subjective:   The patient is a 65 year old female with a past psychiatric history of unspecified psychotic disorder presenting under IVC for paranoia and delusions. She believes that there are people in the woods near her house that have tried to shoot her.   The patient's chart was reviewed and nursing notes were reviewed. Over the past 24 hrs, there were no documented behavioral issues, no PRN medications given for agitation.The patient's case was discussed in multidisciplinary team meeting.  The care team made the following medication changes yesterday: -Increase Risperdal to 2 mg daily and 2 mg QHS  On interview this morning, the patient appears similar to yesterday, exhibiting stable paranoia and delusions. [name] found taking her morning meds, which she carefully looks through before deciding to take. [name] dressed in new clothes. She had previously complained about her clothes and said that she would go out of her room more often if she had better clothes. Unfortunately she does not appear to have followed through on this. She and her nurse are informed about a room lockout during groups, so that the patient can be observed around other people, in anticipation of eventual discharge. She begrudgingly agrees to this. She states that she does not have a mental illness and that she has a "protection order". On examination she exhibits L arm rigidity. She denies suicidal thoughts and auditory/visual hallucinations and first rank symptoms.   Principal Problem: Acute psychosis ([place]) Diagnosis: Principal Problem:   Acute psychosis ([place])  Total Time Spent in Direct Patient Care: I personally spent 30 minutes on the unit in direct patient care. The direct patient care time included face-to-face time with the patient, reviewing the patient's chart, communicating with other professionals, and coordinating care. Greater than 50%  of this time was spent in counseling or coordinating care with the patient regarding goals of hospitalization, psycho-education, and discharge planning needs.   Past Psychiatric History: unspecified psychotic disorder  Past Medical History:  Past Medical History:  Diagnosis Date   Eye globe prosthesis    GERD (gastroesophageal reflux disease)    History of blood transfusion 1973   "related to abscess burst in my stomach"   Hyperlipemia    Hyperlipidemia    Hypertension    Osteoarthritis of back    Lowerback    SVD (spontaneous vaginal delivery)    x 1, baby died at 2 wks of age   Type II diabetes mellitus ([place])     Past Surgical History:  Procedure Laterality Date   APPENDECTOMY     COLONOSCOPY     DILATATION & CURETTAGE/HYSTEROSCOPY WITH MYOSURE N/A 02/25/2015   Procedure: DILATATION & CURETTAGE/HYSTEROSCOPY WITH MYOSURE;  Surgeon: Marylynn Pearson, MD;  Location: [facility];  Service: Gynecology;  Laterality: N/A;   DILATION AND CURETTAGE OF UTERUS     ENUCLEATION Right 03/16/2017   ENUCLEATION Right 03/16/2017   Procedure: ENUCLEATION RIGHT EYE;  Surgeon: Clista Bernhardt, MD;  Location: [place];  Service: Ophthalmology;  Laterality: Right;   EYE SURGERY     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6, 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   EYE SURGERY Right ~ 1974   "S/P initial eye injury; scissors stuck in my eye""   LAPAROSCOPIC CHOLECYSTECTOMY     SHOULDER ARTHROSCOPY WITH ROTATOR CUFF REPAIR Left    wire stitches per patient   SHOULDER ARTHROSCOPY WITH ROTATOR CUFF REPAIR Right    Family History:  Family History  Problem Relation Age of Onset   Diabetes Mother    CAD Mother    Lung cancer Father    Family Psychiatric  History: unknown Social History:  Social History   Substance and Sexual Activity  Alcohol Use Yes   Comment: 03/16/2017 "nothing since 2013"     Social History   Substance and Sexual Activity  Drug Use No    Social History   Socioeconomic History   Marital status: Widowed     Spouse name: Not on file   Number of children: Not on file   Years of education: Not on file   Highest education level: Not on file  Occupational History   Not on file  Tobacco Use   Smoking status: Former    Packs/day: 0.50    Years: 40.00    Pack years: 20.00    Types: Cigarettes    Quit date: 2016    Years since quitting: 6.8   Smokeless tobacco: Never  Vaping Use   Vaping Use: Never used  Substance and Sexual Activity   Alcohol use: Yes    Comment: 03/16/2017 "nothing since 2013"   Drug use: No   Sexual activity: Not Currently    Birth control/protection: Post-menopausal  Other Topics Concern   Not on file  Social History Narrative   Not on file   Social Determinants of Health   Financial Resource Strain: Not on file  Food Insecurity: Not on file  Transportation Needs: Not on file  Physical Activity: Not on file  Stress: Not on file  Social Connections: Not on file   Additional Social History:        Sleep: Good  Appetite:  Fair  Current Medications: Current Facility-Administered Medications  Medication Dose Route Frequency Provider Last Rate Last Admin   acetaminophen (TYLENOL) tablet 650 mg  650 mg Oral Q6H PRN Derrill Center, NP       alum & mag hydroxide-simeth (MAALOX/MYLANTA) 200-200-20 MG/5ML suspension 30 mL  30 mL Oral Q4H PRN Derrill Center, NP       atenolol (TENORMIN) tablet 12.5 mg  12.5 mg Oral Daily Corky Sox, MD   12.5 mg at 10/14/21 1222   atorvastatin (LIPITOR) tablet 40 mg  40 mg Oral Daily Derrill Center, NP   40 mg at 10/14/21 1008   divalproex (DEPAKOTE) DR tablet 500 mg  500 mg Oral QHS Derrill Center, NP   500 mg at 10/13/21 2107   feeding supplement (BOOST / RESOURCE BREEZE) liquid 1 Container  1 Container Oral BID BM Massengill, Ovid Curd, MD   1 Container at 10/14/21 1213   hydrOXYzine (ATARAX/VISTARIL) tablet 25 mg  25 mg Oral TID PRN Derrill Center, NP   25 mg at 10/11/21 1309   ibuprofen (ADVIL) tablet 600 mg  600 mg  Oral TID PRN Bobbitt, Shalon E, NP   600 mg at 10/14/21 1016   lisinopril (ZESTRIL) tablet 10 mg  10 mg Oral Daily Corky Sox, MD   10 mg at 10/14/21 1008   magnesium hydroxide (MILK OF MAGNESIA) suspension 30 mL  30 mL Oral Daily PRN Derrill Center, NP       methimazole (TAPAZOLE) tablet 10 mg  10 mg Oral Daily Corky Sox, MD   10 mg at 10/14/21 1008   pantoprazole (PROTONIX) EC tablet 40 mg  40 mg Oral BID AC Merrily Brittle, DO   40 mg at 10/14/21 1008   risperiDONE (RISPERDAL) tablet 2  mg  2 mg Oral QHS Carlyn Reichert, MD   2 mg at 10/13/21 2106   risperiDONE (RISPERDAL) tablet 2 mg  2 mg Oral Daily Carlyn Reichert, MD   2 mg at 10/14/21 1008   traZODone (DESYREL) tablet 50 mg  50 mg Oral QHS PRN Oneta Rack, NP   50 mg at 10/08/21 2052    Lab Results:  No results found for this or any previous visit (from the past 48 hour(s)).   Blood Alcohol level:  Lab Results  Component Value Date   ETH <10 10/07/2021   ETH <10 08/10/2021    Metabolic Disorder Labs: Lab Results  Component Value Date   HGBA1C 5.6 10/07/2021   MPG 114.02 10/07/2021   No results found for: PROLACTIN Lab Results  Component Value Date   CHOL 253 (H) 10/07/2021   TRIG 99 10/07/2021   HDL 56 10/07/2021   CHOLHDL 4.5 10/07/2021   VLDL 20 10/07/2021   LDLCALC 177 (H) 10/07/2021    Physical Findings: AIMS: 0 on 11/10  Musculoskeletal: Strength & Muscle Tone: within normal limits Gait & Station:  not assessed Patient leans: NA  Psychiatric Specialty Exam:  Presentation  General Appearance: appropriate for environment, new clothes Eye Contact:Fleeting  Speech:Normal Rate  Speech Volume:Normal  Handedness:Right  Mood and Affect  Mood: "okay" Affect: constricted  Thought Process  Thought Processes:Goal Directed  Descriptions of Associations:Circumstantial  Orientation: oriented to person place, time and event  Thought Content:Delusions; Illogical; Paranoid Ideation;  Tangential  History of Schizophrenia/Schizoaffective disorder:Yes  Duration of Psychotic Symptoms:Greater than six months  Hallucinations: denies  Ideas of Reference: none Suicidal Thoughts: denies  Homicidal Thoughts: none reported   Sensorium  Memory:Immediate Fair; Recent Fair; Remote Fair  Judgment:Impaired  Insight:Lacking   Executive Functions  Concentration:Fair  Attention Span:Fair  Recall:Fair  Fund of Knowledge:Fair  Language:Fair   Psychomotor Activity  Psychomotor Activity:No data recorded   Assets  Assets:Communication Skills; Desire for Improvement   Sleep  Sleep: fair Patient slept Number of Hours: 5   Physical Exam: Physical Exam Vitals reviewed.  Constitutional:      Appearance: [name] not ill-appearing or toxic-appearing.  Eyes:     Extraocular Movements: Extraocular movements intact.  Pulmonary:     Effort: Pulmonary effort is normal.  Musculoskeletal:        General: Normal range of motion.     Cervical back: Normal range of motion.  Neurological:     General: No focal deficit present.     Mental Status: [name] alert.   Review of Systems  Respiratory:  Negative for shortness of breath.   Cardiovascular:  Negative for chest pain.  Gastrointestinal:  Negative for nausea and vomiting.  Blood pressure 113/66, pulse 98, temperature 97.8 F (36.6 C), resp. rate 16, height 5\' 3"  (1.6 m), weight 60.8 kg, SpO2 100 %. Body mass index is 23.74 kg/m.   Treatment Plan Summary: Daily contact with patient to assess and evaluate symptoms and progress in treatment and Medication management  Assessment Schizophrenia   Psychosis -Continue Risperdal 2 mg AM and 2 mg QHS -Continue home Depakote 500 mg for mood stabilization             -Depakote level of 0 on admission  -Repeat Depakote level of 30  -Given that the patient does not have significant mood lability or persistent irritability will continue at current dose Antipsychotic  labs             EKG:  sinus brady, some inverted T-waves, discussed with ED attending, Qtc of 427             Lipids: chol of 253, LDL 177 (collected at 2 AM)             A1C: 5.6 AIMS: 0 on 11/10, rigidity on L side noted   Medical Management Covid negative CMP: unremarkable CBC: unremarkable EtOH: <10 UDS: negative Upreg: NA TSH: 1.2 UA: leukocytes, negative nitrites   Potential UTI Patient denies symptoms, however, given UA and psychosis will continue to treat. Patient has been refusing Cipro consistently, saying that she does not have the symptoms of a UTI.  -Cipro 250 mg BID for 3 days -Follow-up repeat UA  HTN -Home lisinopril 10 mg  Hyperthyroidism A. fib Verified in endocrinology notes. Patient last filled her 52 d supply of methimazole on 7/6 and ran out one month ago. -Continue methimazole -Continue atenolol   Sciatica Offered patient gabapentin or Lyrica, however she says that she tried in the past and they have been ineffective. Discussed with patient trial with lidocaine patch to see if there is any relief. -Lidocaine patch  GERD -Protonix  Corky Sox, MD Psychiatry Resident, PGY-1 [facility] 10/14/2021, 1:56 PM

## 2021-10-14 NOTE — [facility] Group Notes (Signed)
Patient did not attend wrap up group. 

## 2021-10-15 DIAGNOSIS — F23 Brief psychotic disorder: Secondary | ICD-10-CM | POA: Diagnosis not present

## 2021-10-15 LAB — URINALYSIS, ROUTINE W REFLEX MICROSCOPIC
Bacteria, UA: NONE SEEN
Bilirubin Urine: NEGATIVE
Glucose, UA: NEGATIVE mg/dL
Hgb urine dipstick: NEGATIVE
Ketones, ur: 5 mg/dL — AB
Nitrite: NEGATIVE
Protein, ur: NEGATIVE mg/dL
Specific Gravity, Urine: 1.015 (ref 1.005–1.030)
pH: 5 (ref 5.0–8.0)

## 2021-10-15 NOTE — Progress Notes (Signed)
[facility] MD Progress Note  10/15/2021 5:43 PM Shari [name]  MRN:  [id number] Subjective:   The patient is a 65 year old female with a past psychiatric history of unspecified psychotic disorder presenting under IVC for paranoia and delusions. She believes that there are people in the woods near her house that have tried to shoot her.   The patient's chart was reviewed and nursing notes were reviewed. Over the past 24 hrs, there were no documented behavioral issues, no PRN medications given for agitation.The patient's case was discussed in multidisciplinary team meeting.  The care team made the following medication changes yesterday: -Decrease Risperdal to 1 mg daily and 2 mg QHS, given EPS  On interview this morning, the patient appears to have relatively logical thought process but fixed delusions.  She reports that her appetite and sleep are both good.  She does not appear to be having any paranoia surrounding food.  She reports feeling safe on the unit.  She reports that there may still be people in the woods near her sister's house who wished her harm.  She does not believe these people are needed a psychiatric hospital.  The patient denies suicidal thoughts and auditory/visual hallucinations.  Of note, the patient worked with occupational therapy who stated the patient can independently perform ADLs and IADLs.  Principal Problem: Acute psychosis ([place]) Diagnosis: Principal Problem:   Acute psychosis ([place])  Total Time Spent in Direct Patient Care: I personally spent 30 minutes on the unit in direct patient care. The direct patient care time included face-to-face time with the patient, reviewing the patient's chart, communicating with other professionals, and coordinating care. Greater than 50% of this time was spent in counseling or coordinating care with the patient regarding goals of hospitalization, psycho-education, and discharge planning needs.   Past Psychiatric History: unspecified  psychotic disorder  Past Medical History:  Past Medical History:  Diagnosis Date   Eye globe prosthesis    GERD (gastroesophageal reflux disease)    History of blood transfusion 1973   "related to abscess burst in my stomach"   Hyperlipemia    Hyperlipidemia    Hypertension    Osteoarthritis of back    Lowerback    SVD (spontaneous vaginal delivery)    x 1, baby died at 2 wks of age   Type II diabetes mellitus ([place])     Past Surgical History:  Procedure Laterality Date   APPENDECTOMY     COLONOSCOPY     DILATATION & CURETTAGE/HYSTEROSCOPY WITH MYOSURE N/A 02/25/2015   Procedure: DILATATION & CURETTAGE/HYSTEROSCOPY WITH MYOSURE;  Surgeon: Marylynn Pearson, MD;  Location: [facility];  Service: Gynecology;  Laterality: N/A;   DILATION AND CURETTAGE OF UTERUS     ENUCLEATION Right 03/16/2017   ENUCLEATION Right 03/16/2017   Procedure: ENUCLEATION RIGHT EYE;  Surgeon: Clista Bernhardt, MD;  Location: [place];  Service: Ophthalmology;  Laterality: Right;   EYE SURGERY     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6, 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   EYE SURGERY Right ~ 1974   "S/P initial eye injury; scissors stuck in my eye""   LAPAROSCOPIC CHOLECYSTECTOMY     SHOULDER ARTHROSCOPY WITH ROTATOR CUFF REPAIR Left    wire stitches per patient   SHOULDER ARTHROSCOPY WITH ROTATOR CUFF REPAIR Right    Family History:  Family History  Problem Relation Age of Onset   Diabetes Mother    CAD Mother    Lung cancer Father    Family Psychiatric  History: unknown  Social History:  Social History   Substance and Sexual Activity  Alcohol Use Yes   Comment: 03/16/2017 "nothing since 2013"     Social History   Substance and Sexual Activity  Drug Use No    Social History   Socioeconomic History   Marital status: Widowed    Spouse name: Not on file   Number of children: Not on file   Years of education: Not on file   Highest education level: Not on file  Occupational History   Not on file  Tobacco Use   Smoking status:  Former    Packs/day: 0.50    Years: 40.00    Pack years: 20.00    Types: Cigarettes    Quit date: 2016    Years since quitting: 6.8   Smokeless tobacco: Never  Vaping Use   Vaping Use: Never used  Substance and Sexual Activity   Alcohol use: Yes    Comment: 03/16/2017 "nothing since 2013"   Drug use: No   Sexual activity: Not Currently    Birth control/protection: Post-menopausal  Other Topics Concern   Not on file  Social History Narrative   Not on file   Social Determinants of Health   Financial Resource Strain: Not on file  Food Insecurity: Not on file  Transportation Needs: Not on file  Physical Activity: Not on file  Stress: Not on file  Social Connections: Not on file   Additional Social History:        Sleep: Good  Appetite:  Fair  Current Medications: Current Facility-Administered Medications  Medication Dose Route Frequency Provider Last Rate Last Admin   acetaminophen (TYLENOL) tablet 650 mg  650 mg Oral Q6H PRN Oneta Rack, NP       alum & mag hydroxide-simeth (MAALOX/MYLANTA) 200-200-20 MG/5ML suspension 30 mL  30 mL Oral Q4H PRN Oneta Rack, NP       atenolol (TENORMIN) tablet 12.5 mg  12.5 mg Oral Daily Carlyn Reichert, MD   12.5 mg at 10/15/21 1401   atorvastatin (LIPITOR) tablet 40 mg  40 mg Oral Daily Oneta Rack, NP   40 mg at 10/15/21 1215   divalproex (DEPAKOTE) DR tablet 500 mg  500 mg Oral QHS Oneta Rack, NP   500 mg at 10/14/21 2117   feeding supplement (BOOST / RESOURCE BREEZE) liquid 1 Container  1 Container Oral BID BM Massengill, Harrold Donath, MD   1 Container at 10/15/21 1402   hydrOXYzine (ATARAX/VISTARIL) tablet 25 mg  25 mg Oral TID PRN Oneta Rack, NP   25 mg at 10/11/21 1309   ibuprofen (ADVIL) tablet 600 mg  600 mg Oral TID PRN Bobbitt, Shalon E, NP   600 mg at 10/15/21 1150   lisinopril (ZESTRIL) tablet 10 mg  10 mg Oral Daily Carlyn Reichert, MD   10 mg at 10/15/21 1215   magnesium hydroxide (MILK OF MAGNESIA)  suspension 30 mL  30 mL Oral Daily PRN Oneta Rack, NP       methimazole (TAPAZOLE) tablet 10 mg  10 mg Oral Daily Carlyn Reichert, MD   10 mg at 10/15/21 1400   pantoprazole (PROTONIX) EC tablet 40 mg  40 mg Oral BID AC Princess Bruins, DO   40 mg at 10/15/21 1736   risperiDONE (RISPERDAL) tablet 1 mg  1 mg Oral Daily Massengill, Nathan, MD   1 mg at 10/15/21 1217   risperiDONE (RISPERDAL) tablet 2 mg  2 mg Oral QHS Carlyn Reichert, MD  2 mg at 10/14/21 2118   traZODone (DESYREL) tablet 50 mg  50 mg Oral QHS PRN Derrill Center, NP   50 mg at 10/08/21 2052    Lab Results:  Results for orders placed or performed during the hospital encounter of 10/07/21 (from the past 48 hour(s))  Urinalysis, Routine w reflex microscopic Urine, Clean Catch     Status: Abnormal   Collection Time: 10/14/21  6:49 PM  Result Value Ref Range   Color, Urine YELLOW YELLOW   APPearance CLEAR CLEAR   Specific Gravity, Urine 1.015 1.005 - 1.030   pH 5.0 5.0 - 8.0   Glucose, UA NEGATIVE NEGATIVE mg/dL   Hgb urine dipstick NEGATIVE NEGATIVE   Bilirubin Urine NEGATIVE NEGATIVE   Ketones, ur 5 (A) NEGATIVE mg/dL   Protein, ur NEGATIVE NEGATIVE mg/dL   Nitrite NEGATIVE NEGATIVE   Leukocytes,Ua TRACE (A) NEGATIVE   RBC / HPF 0-5 0 - 5 RBC/hpf   WBC, UA 0-5 0 - 5 WBC/hpf   Bacteria, UA NONE SEEN NONE SEEN   Squamous Epithelial / LPF 0-5 0 - 5   Mucus PRESENT     Comment: Performed at [facility], [place] [address] [place], [place] [address]     Blood Alcohol level:  Lab Results  Component Value Date   [facility] <10 10/07/2021   ETH <10 [id number]    Metabolic Disorder Labs: Lab Results  Component Value Date   HGBA1C 5.6 10/07/2021   MPG 114.02 10/07/2021   No results found for: PROLACTIN Lab Results  Component Value Date   CHOL 253 (H) 10/07/2021   TRIG 99 10/07/2021   HDL 56 10/07/2021   CHOLHDL 4.5 10/07/2021   VLDL 20 10/07/2021   LDLCALC 177 (H) 10/07/2021    Physical  Findings: AIMS: 0 on 11/10  Musculoskeletal: Strength & Muscle Tone: within normal limits Gait & Station:  not assessed Patient leans: NA  Psychiatric Specialty Exam:  Presentation  General Appearance: appropriate for environment, new clothes Eye Contact:Fleeting  Speech:Normal Rate  Speech Volume:Normal  Handedness:Right  Mood and Affect  Mood: "okay" Affect: constricted  Thought Process  Thought Processes:Goal Directed  Descriptions of Associations:Circumstantial  Orientation: oriented to person place, time and event  Thought Content:Delusions; Illogical; Paranoid Ideation; Tangential  History of Schizophrenia/Schizoaffective disorder:Yes  Duration of Psychotic Symptoms:Greater than six months  Hallucinations: denies  Ideas of Reference: none Suicidal Thoughts: denies  Homicidal Thoughts: none reported   Sensorium  Memory:Immediate Fair; Recent Fair; Remote [place]   Executive Functions  Concentration:Fair  Attention Span:Fair  [place]   Psychomotor Activity  Psychomotor Activity:No data recorded   Assets  Assets:Communication Skills; Desire for Improvement   Sleep  Sleep: fair Patient slept Number of Hours: 6.75   Physical Exam: Physical Exam Vitals reviewed.  Constitutional:      Appearance: [name] not ill-appearing or toxic-appearing.  Eyes:     Extraocular Movements: Extraocular movements intact.  Pulmonary:     Effort: Pulmonary effort is normal.  Musculoskeletal:        General: Normal range of motion.     Cervical back: Normal range of motion.  Neurological:     General: No focal deficit present.     Mental Status: [name] alert.   Review of Systems  Respiratory:  Negative for shortness of breath.   Cardiovascular:  Negative for chest pain.  Gastrointestinal:  Negative for nausea and vomiting.  Blood  pressure 118/64, pulse 67, temperature  97.7 F (36.5 C), temperature source Oral, resp. rate 16, height 5\' 3"  (1.6 m), weight 60.8 kg, SpO2 100 %. Body mass index is 23.74 kg/m.   Treatment Plan Summary: Daily contact with patient to assess and evaluate symptoms and progress in treatment and Medication management  Assessment Schizophrenia   Psychosis -Continue Risperdal 1 mg AM and 2 mg QHS, for psychosis -Continue home Depakote 500 mg for mood stabilization             -Depakote level of 0 on admission  -Repeat Depakote level of 30  -Given that the patient does not have significant mood lability or persistent irritability will continue at current dose Antipsychotic labs             EKG: sinus brady, some inverted T-waves, discussed with ED attending, Qtc of 427             Lipids: chol of 253, LDL 177 (collected at 2 AM)             A1C: 5.6 AIMS: 0 on 11/10, cogwheeling of R wrist noted   Medical Management Covid negative CMP: unremarkable CBC: unremarkable EtOH: <10 UDS: negative Upreg: NA TSH: 1.2 UA: leukocytes, negative nitrites   Potential UTI Patient denies symptoms, however, given UA and psychosis will continue to treat. Patient has been refusing Cipro consistently, saying that she does not have the symptoms of a UTI.  -Cipro 250 mg BID for 3 days -Follow-up repeat UA  HTN -Home lisinopril 10 mg  Hyperthyroidism A. fib Verified in endocrinology notes. Patient last filled her 21 d supply of methimazole on 7/6 and ran out one month ago. -Continue methimazole -Continue atenolol   Sciatica Offered patient gabapentin or Lyrica, however she says that she tried in the past and they have been ineffective. Discussed with patient trial with lidocaine patch to see if there is any relief. -Lidocaine patch  GERD -Protonix  Corky Sox, MD Psychiatry Resident, PGY-1 [facility] 10/15/2021, 5:43 PM

## 2021-10-15 NOTE — Group Note (Signed)
Recreation Therapy Group Note   Group Topic:Stress Management  Group Date: 10/15/2021 Start Time: 0930 End Time: 0950 Facilitators: Caroll Rancher, LRT,CTRS Location: [address] Dayroom   Goal Area(s) Addresses:  Patient will actively participate in stress management techniques presented during session.  Patient will successfully identify benefit of practicing stress management post d/c.   Group Description: Guided Imagery. LRT provided education, instruction, and demonstration on practice of visualization via guided imagery. Patient was asked to participate in the technique introduced during session. LRT debriefed including topics of mindfulness, stress management and specific scenarios each patient could use these techniques. Patients were given suggestions of ways to access scripts post d/c and encouraged to explore Youtube and other apps available on smartphones, tablets, and computers.   Affect/Mood: Appropriate   Participation Level: Engaged   Participation Quality: Independent   Behavior: Appropriate   Speech/Thought Process: Focused   Insight: Good   Judgement: Good   Modes of Intervention: Ambient Sounds, Script   Patient Response to Interventions:  Engaged   Education Outcome:  Acknowledges education and In group clarification offered    Clinical Observations/Individualized Feedback: Pt attended and participated in group.    Plan: Continue to engage patient in RT group sessions 2-3x/week.   Caroll Rancher, LRT,CTRS 10/15/2021 11:13 AM

## 2021-10-15 NOTE — Progress Notes (Signed)
Patient isolative in her room this evening. Denies SI/HI/A/VH and verbally contracted for safety. Q 15 minutes safety checks ongoing.  Patient remains safe. Support and encouragement provided. Will continue to monitor.

## 2021-10-15 NOTE — Progress Notes (Signed)
   10/15/21 1624  Vital Signs  Pulse Rate 67  BP 118/64  BP Method Automatic   D: Patient denies SI/HI/AVH. Pt. Denies anxiety and depression.  A:  Patient took scheduled medicine.  Support and encouragement provided Routine safety checks conducted every 15 minutes. Patient  Informed to notify staff with any concerns.   R:  Safety maintained.

## 2021-10-15 NOTE — [facility] Group Notes (Signed)
PsychoEducational Group- Self Care Toolkit and Sleep Hygiene were discussed in group. The patients were asked to identify healthy and unhealthy behaviors as it relates to sleep hygiene. Patients were also asked to identify simple was to improve self care, identifying a gratitude list, establishing healthy boundaries and recognizing the impact of unhealthy boundaries as it impacts mental health. Pt participated minimally, but was appropriate.

## 2021-10-15 NOTE — Progress Notes (Signed)
Writer spoke with patient 1:1. She was lying on her bed awake when writer entered her room. She reported having had a good day. Writer asked if she felt more comfortable on this particular hall and she replied yes. She was informed of her medications to take later on. She did report that she may be leaving Mon/Tues. When asked if she had family to stay with she reported that her sister is in prison and mumbled something about her niece. [name] isolative to her room. Safety maintained with 15 min checks.

## 2021-10-15 NOTE — [facility] Group Notes (Signed)
Goal Orientation group was held. Patient states she is feeling ''okay and wants to work on going forward finding her a new place'' Unit and ward rules were covered. Patients were asked to complete self inventory and address all concerns with provider.

## 2021-10-15 NOTE — Group Note (Signed)
LCSW Group Therapy Note   Group Date: 10/15/2021 Start Time: 1300 End Time: 1400   Type of Therapy and Topic:  Group Therapy: Coping Skills  Participation Level:  Active  Due to the acuity and complex discharge plans, group was not held. Patient was provided therapeutic worksheets and asked to meet with CSW as needed.  Annaliese Saez M Teddie Curd, LCSWA 10/15/2021  2:27 PM    

## 2021-10-15 NOTE — [facility] Group Notes (Signed)
Patient attended relaxation group. Patient was actively engaged and appropriate throughout.  

## 2021-10-16 NOTE — Group Note (Signed)
Date:  10/16/2021 Time:  9:44 AM  Group Topic/Focus:  Goals Group:   The focus of this group is to help patients establish daily goals to achieve during treatment and discuss how the patient can incorporate goal setting into their daily lives to aide in recovery. Orientation:   The focus of this group is to educate the patient on the purpose and policies of crisis stabilization and provide a format to answer questions about their admission.  The group details unit policies and expectations of patients while admitted.    Participation Level:  Did Not Attend

## 2021-10-16 NOTE — Group Note (Signed)
Date:  10/16/2021 Time:  10:54 AM  Number of Participants: 10  Group Focus: daily focus Treatment Modality:  Psychoeducation Interventions utilized were group exercise Purpose: express feelings and increase insight   Participation Level:  Did Not Attend

## 2021-10-16 NOTE — Progress Notes (Signed)
Patient remains isolative to her room but compliant with her medications. She requested medication for left leg pain she rated a 6. Encouraged her to come to dayroom some and watch tv or change of scenery from staying in her room. She declined.

## 2021-10-16 NOTE — [facility] Group Notes (Signed)
1600 - PsychoEducational group- Patients were educated on the power of negative versus positive thinking, and the habits of both and impact on our mental health. Patients were educated on positive re-framing and given examples in day to day life. Pts were given example by showing spaghetti noodles, while together, one thought is not much, over and over becomes more powerful, and harder to break. Patients were then asked to break a noodle and give an example of a negative thought in their life they needed to break free from. Pt did not attend.

## 2021-10-16 NOTE — Group Note (Signed)
LCSW Group Therapy Note  10/16/2021    10:00-11:00am   Type of Therapy and Topic:  Group Therapy: Early Messages Received About Anger  Participation Level:  Minimal   Description of Group:   In this group, patients shared and discussed the early messages received in their lives about anger through parental or other adult modeling, teaching, repression, punishment, violence, and more.  Participants identified how those childhood lessons influence even now how they usually or often react when angered.  The group discussed that anger is a secondary emotion and what may be the underlying emotional themes that come out through anger outbursts or that are ignored through anger suppression.    Therapeutic Goals: Patients will identify one or more childhood message about anger that they received and how it was taught to them. Patients will discuss how these childhood experiences have influenced and continue to influence their own expression or repression of anger even today. Patients will explore possible primary emotions that tend to fuel their secondary emotion of anger. Patients will learn that anger itself is normal and cannot be eliminated, and that healthier coping skills can assist with resolving conflict rather than worsening situations.  Summary of Patient Progress:  The patient shared that her childhood lessons about anger were from parents who did not display much anger, and from being told a lot "don't start' things with other people, but "ignore it" instead.  As a result, she even now tries to ignore conflict.   She did say she is currently angry with her sister and expressed having some guilt over this.  She listened attentively but did not participate in the discussion.  Therapeutic Modalities:   Cognitive Behavioral Therapy Motivation Interviewing  Lynnell Chad  .

## 2021-10-16 NOTE — Progress Notes (Signed)
Pt presents with somewhat depressed mood, blunted affect. Pt continues to speak in whisper tone and appears somewhat guarded, although she did smile with Clinical research associate and answered questions appropriately. She states she does not feel any SI/HI/ A /V Hallucinations. She states she is eating and drinking well and feels ready to work on plan on discharge of where she can go next. She has been observed attended in groups and encouraged to do the same. No signs of acute distress. Treatment team updated as above.

## 2021-10-16 NOTE — Progress Notes (Addendum)
[facility] MD Progress Note  10/16/2021 3:22 PM Shari [name]  MRN:  [id number] Subjective:   The patient is a 65 year old female with a past psychiatric history of unspecified psychotic disorder presenting under IVC for paranoia and delusions. She believes that there are people in the woods near her house that have tried to shoot her.   The patient's chart was reviewed and nursing notes were reviewed. Over the past 24 hrs, there were no documented behavioral issues, no PRN medications given for agitation.The patient's case was discussed in multidisciplinary team meeting.  The patient was noted to have attended many groups yesterday and was noted to have engaged well.  The care team made the following medication changes yesterday: -Continue Risperdal to 1 mg daily and 2 mg QHS, given EPS  On interview this morning, the patient appears to have relatively logical thought process but fixed delusions, which have lessened in intensity.  When she is asked about her delusion that there are people in the woods near her sister's house who wish her harm, she is more noncommittal in previous days, simply saying "I do not know".  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  She reports a good appetite and no paranoia surrounding her food.  She denies suicidal thoughts and auditory/visual hallucinations and first rank symptoms.  Principal Problem: Acute psychosis (HCC) Diagnosis: Principal Problem:   Acute psychosis (HCC)  Total Time Spent in Direct Patient Care: I personally spent 30 minutes on the unit in direct patient care. The direct patient care time included face-to-face time with the patient, reviewing the patient's chart, communicating with other professionals, and coordinating care. Greater than 50% of this time was spent in counseling or coordinating care with the patient regarding goals of hospitalization, psycho-education, and discharge planning  needs.   Past Psychiatric History: unspecified psychotic disorder  Past Medical History:  Past Medical History:  Diagnosis Date   Eye globe prosthesis    GERD (gastroesophageal reflux disease)    History of blood transfusion 1973   "related to abscess burst in my stomach"   Hyperlipemia    Hyperlipidemia    Hypertension    Osteoarthritis of back    Lowerback    SVD (spontaneous vaginal delivery)    x 1, baby died at 2 wks of age   Type II diabetes mellitus (HCC)     Past Surgical History:  Procedure Laterality Date   APPENDECTOMY     COLONOSCOPY     DILATATION & CURETTAGE/HYSTEROSCOPY WITH MYOSURE N/A 02/25/2015   Procedure: DILATATION & CURETTAGE/HYSTEROSCOPY WITH MYOSURE;  Surgeon: [name], MD;  Location: [facility];  Service: Gynecology;  Laterality: N/A;   DILATION AND CURETTAGE OF UTERUS     ENUCLEATION Right 03/16/2017   ENUCLEATION Right 03/16/2017   Procedure: ENUCLEATION RIGHT EYE;  Surgeon: [name], MD;  Location: [facility] OR;  Service: Ophthalmology;  Laterality: Right;   EYE SURGERY     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6, 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   EYE SURGERY Right ~ 1974   "S/P initial eye injury; scissors stuck in my eye""   LAPAROSCOPIC CHOLECYSTECTOMY     SHOULDER ARTHROSCOPY WITH ROTATOR CUFF REPAIR Left    wire stitches per patient   SHOULDER ARTHROSCOPY WITH ROTATOR CUFF REPAIR Right    Family History:  Family History  Problem Relation Age of Onset   Diabetes Mother    CAD Mother  Lung cancer Father    Family Psychiatric  History: unknown Social History:  Social History   Substance and Sexual Activity  Alcohol Use Yes   Comment: 03/16/2017 "nothing since 2013"     Social History   Substance and Sexual Activity  Drug Use No    Social History   Socioeconomic History   Marital status: Widowed    Spouse name: Not on file   Number of children: Not on file   Years of education: Not on file   Highest education level: Not on file  Occupational  History   Not on file  Tobacco Use   Smoking status: Former    Packs/day: 0.50    Years: 40.00    Pack years: 20.00    Types: Cigarettes    Quit date: 2016    Years since quitting: 6.8   Smokeless tobacco: Never  Vaping Use   Vaping Use: Never used  Substance and Sexual Activity   Alcohol use: Yes    Comment: 03/16/2017 "nothing since 2013"   Drug use: No   Sexual activity: Not Currently    Birth control/protection: Post-menopausal  Other Topics Concern   Not on file  Social History Narrative   Not on file   Social Determinants of Health   Financial Resource Strain: Not on file  Food Insecurity: Not on file  Transportation Needs: Not on file  Physical Activity: Not on file  Stress: Not on file  Social Connections: Not on file   Additional Social History:        Sleep: Good  Appetite:  Fair  Current Medications: Current Facility-Administered Medications  Medication Dose Route Frequency Provider Last Rate Last Admin   acetaminophen (TYLENOL) tablet 650 mg  650 mg Oral Q6H PRN Derrill Center, NP       alum & mag hydroxide-simeth (MAALOX/MYLANTA) 200-200-20 MG/5ML suspension 30 mL  30 mL Oral Q4H PRN Derrill Center, NP       atenolol (TENORMIN) tablet 12.5 mg  12.5 mg Oral Daily Corky Sox, MD   12.5 mg at 10/16/21 0745   atorvastatin (LIPITOR) tablet 40 mg  40 mg Oral Daily Derrill Center, NP   40 mg at 10/16/21 0748   divalproex (DEPAKOTE) DR tablet 500 mg  500 mg Oral QHS Derrill Center, NP   500 mg at 10/15/21 2124   feeding supplement (BOOST / RESOURCE BREEZE) liquid 1 Container  1 Container Oral BID BM Massengill, Ovid Curd, MD   1 Container at 10/16/21 1454   hydrOXYzine (ATARAX/VISTARIL) tablet 25 mg  25 mg Oral TID PRN Derrill Center, NP   25 mg at 10/15/21 2124   ibuprofen (ADVIL) tablet 600 mg  600 mg Oral TID PRN Bobbitt, Shalon E, NP   600 mg at 10/16/21 0751   lisinopril (ZESTRIL) tablet 10 mg  10 mg Oral Daily Corky Sox, MD   10 mg at  10/16/21 0748   magnesium hydroxide (MILK OF MAGNESIA) suspension 30 mL  30 mL Oral Daily PRN Derrill Center, NP       methimazole (TAPAZOLE) tablet 10 mg  10 mg Oral Daily Corky Sox, MD   10 mg at 10/16/21 0843   pantoprazole (PROTONIX) EC tablet 40 mg  40 mg Oral BID AC Merrily Brittle, DO   40 mg at 10/16/21 0654   risperiDONE (RISPERDAL) tablet 1 mg  1 mg Oral Daily Massengill, Nathan, MD   1 mg at 10/16/21 0748   risperiDONE (RISPERDAL)  tablet 2 mg  2 mg Oral QHS Corky Sox, MD   2 mg at 10/15/21 2124   traZODone (DESYREL) tablet 50 mg  50 mg Oral QHS PRN Derrill Center, NP   50 mg at 10/15/21 2124    Lab Results:  Results for orders placed or performed during the hospital encounter of 10/07/21 (from the past 48 hour(s))  Urinalysis, Routine w reflex microscopic Urine, Clean Catch     Status: Abnormal   Collection Time: 10/14/21  6:49 PM  Result Value Ref Range   Color, Urine YELLOW YELLOW   APPearance CLEAR CLEAR   Specific Gravity, Urine 1.015 1.005 - 1.030   pH 5.0 5.0 - 8.0   Glucose, UA NEGATIVE NEGATIVE mg/dL   Hgb urine dipstick NEGATIVE NEGATIVE   Bilirubin Urine NEGATIVE NEGATIVE   Ketones, ur 5 (A) NEGATIVE mg/dL   Protein, ur NEGATIVE NEGATIVE mg/dL   Nitrite NEGATIVE NEGATIVE   Leukocytes,Ua TRACE (A) NEGATIVE   RBC / HPF 0-5 0 - 5 RBC/hpf   WBC, UA 0-5 0 - 5 WBC/hpf   Bacteria, UA NONE SEEN NONE SEEN   Squamous Epithelial / LPF 0-5 0 - 5   Mucus PRESENT     Comment: Performed at [facility], [place] [address] [place], [place] [address]     Blood Alcohol level:  Lab Results  Component Value Date   [facility] <10 10/07/2021   ETH <10 [id number]    Metabolic Disorder Labs: Lab Results  Component Value Date   HGBA1C 5.6 10/07/2021   MPG 114.02 10/07/2021   No results found for: PROLACTIN Lab Results  Component Value Date   CHOL 253 (H) 10/07/2021   TRIG 99 10/07/2021   HDL 56 10/07/2021   CHOLHDL 4.5 10/07/2021   VLDL 20  10/07/2021   LDLCALC 177 (H) 10/07/2021    Physical Findings: AIMS: 0 on 11/10  Musculoskeletal: Strength & Muscle Tone: within normal limits Gait & Station:  not assessed Patient leans: NA  Psychiatric Specialty Exam:  Presentation  General Appearance: appropriate for environment, new clothes Eye Contact:Fair  Speech:normal rate and fluency  Speech Volume:Normal  Handedness:Right  Mood and Affect  Mood: "good" - appears calm  Affect: constricted but polite and calm  Thought Process  Thought Processes:Goal Directed  Descriptions of Associations: intact Orientation: oriented to person place, time and event  Thought Content: delusions have softened, denies suicidal thoughts and auditory/visual hallucinations and first rank symptoms  History of Schizophrenia/Schizoaffective disorder:Yes  Duration of Psychotic Symptoms:Greater than six months  Hallucinations: denies  Ideas of Reference: none  Suicidal Thoughts: denies  Homicidal Thoughts: none reported   Sensorium  Memory:Immediate Fair; Recent Fair; Remote [place]   Executive Functions  Concentration:Fair  Attention Span:Fair  [place]   Psychomotor Activity  Psychomotor Activity: normal   Assets  Assets:Communication Skills; Desire for Improvement   Sleep  Sleep: fair Patient slept Number of Hours: 5.25   Physical Exam Vitals reviewed.  Constitutional:      Appearance: [name] not ill-appearing or toxic-appearing.  HENT:     Head: Normocephalic.  Pulmonary:     Effort: Pulmonary effort is normal.  Neurological:     General: No focal deficit present.     Mental Status: [name] alert.   Review of Systems  Respiratory:  Negative for shortness of breath.   Cardiovascular:  Negative for chest pain.  Gastrointestinal:  Negative for nausea and  vomiting.  Blood pressure 98/60, pulse 73, temperature 98 F (36.7  C), temperature source Oral, resp. rate 16, height 5\' 3"  (1.6 m), weight 60.8 kg, SpO2 100 %. Body mass index is 23.74 kg/m.   Treatment Plan Summary: Daily contact with patient to assess and evaluate symptoms and progress in treatment and Medication management  Assessment #Delusional d/o #UTI #Afib #hyperthyroidism #sciatica #GERD    Delusional d/o -Continue Risperdal 1 mg AM and 2 mg QHS, for psychosis -Continue home Depakote 500 mg for mood stabilization             -Depakote level of 0 on admission  -Repeat Depakote level of 30  -Given that the patient does not have significant mood lability or persistent irritability will continue at current dose Antipsychotic labs             EKG: sinus brady, some inverted T-waves, discussed with ED attending, Qtc of 427             Lipids: chol of 253, LDL 177 (collected at 2 AM)             A1C: 5.6 AIMS: 0 on 11/10, cogwheeling of R wrist noted, resolved on 11/12   Medical Management Covid negative CMP: unremarkable CBC: unremarkable EtOH: <10 UDS: negative Upreg: NA TSH: 1.2 UA: leukocytes, negative nitrites   Abnormal UA Patient denies symptoms and patient has refused antibiotic saying that she does not have the symptoms of a UTI.  -Repeat UA on 11/10 shows trace leukocytes and 5 ketones otherwise WNL  HTN -Home lisinopril 10 mg  Hyperthyroidism A. fib Verified in endocrinology notes. Patient last filled her 75 d supply of methimazole on 7/6 and ran out one month ago. -Continue methimazole -Continue atenolol   Sciatica Offered patient gabapentin or Lyrica, however she says that she tried in the past and they have been ineffective. Discussed with patient trial with lidocaine patch to see if there is any relief. -Lidocaine patch  GERD -Protonix  Corky Sox, MD Psychiatry Resident, PGY-1 [facility] 10/16/2021, 3:22 PM
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
## 2021-10-17 NOTE — Plan of Care (Signed)
Patient has been in the milieu, pleasant and cooperative. Alert and oriented x 4. Attending groups and denies SI/HI/AVH. Appetite is good. Patient  reports that she is sleeping well. No sign of distress.

## 2021-10-17 NOTE — Group Note (Addendum)
[facility] LCSW Group Therapy Note  10/17/2021   10:00-11:00AM  Type of Therapy and Topic:  Group Therapy:  Unhealthy versus Healthy Supports, Which Am I?  Participation Level:  Did Not Attend   Description of Group:  Patients in this group were introduced to the concept that additional supports including self-support are an essential part of recovery.  Initially a discussion was held about the differences between healthy versus unhealthy supports.  Patients were asked to share what unhealthy supports in their lives need to be addressed, as well as what additional healthy supports could be added for greater help in reaching their goals.   A song entitled "My Own Hero" was played and a group discussion ensued in which patients stated they could relate to the song and it inspired them to realize they have be willing to help themselves in order to succeed, because other people cannot achieve sobriety or stability for them.  We discussed adding a variety of healthy supports to address the various needs in patient lives, including becoming more self-supportive.  A song was played called "I Know Where I've Been" toward the end of group and used to conduct an inspirational wrap-up to group of remembering how far they have already come in their journey.  Therapeutic Goals: 1)  Highlight the differences between healthy and unhealthy supports 2)  Suggest the importance of being a part of one's own support system 2)  Discuss reasons people in one's life may eventually be unable to be continually supportive  3)  Identify the patient's current support system   4) Elicit commitments to add healthy supports and to become more conscious of being self-supportive   Summary of Patient Progress:  The patient was invited to group, did not attend.  Therapeutic Modalities:   Motivational Interviewing Activity  Lynnell Chad , MSW, LCSW

## 2021-10-17 NOTE — Progress Notes (Signed)
Pt did not attend group. 

## 2021-10-17 NOTE — [facility] Counselor (Signed)
Adult Psychoeducational Group Not Date:  10/17/2021 Time:  0900-1045 Group Topic/Focus: PROGRESSIVE RELAXATION. A group where deep breathing is taught and tensing and relaxation muscle groups is used. Imagery is used as well.  Pts are asked to imagine 3 pillars that hold them up when they are not able to hold themselves up.  Participation Level:  Active  Participation Quality:  Appropriate  Affect:  Appropriate  Cognitive:  Oriented  Insight: Improving  Engagement in Group:  Engaged  Modes of Intervention:  Activity, Discussion, Education, and Support  Additional Comments:  rates energy at a 8/10. God, family and friends are her pillars  Dione Housekeeper
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
## 2021-10-17 NOTE — [facility] Group Notes (Signed)
Pt didn't attend goals group 

## 2021-10-18 ENCOUNTER — Encounter (HOSPITAL_COMMUNITY): Payer: Self-pay

## 2021-10-18 DIAGNOSIS — G3184 Mild cognitive impairment, so stated: Secondary | ICD-10-CM

## 2021-10-18 MED ORDER — BENZTROPINE MESYLATE 0.5 MG PO TABS
0.5000 mg | ORAL_TABLET | Freq: Two times a day (BID) | ORAL | Status: DC
  Administered 2021-10-18 – 2021-10-19 (×2): 0.5 mg via ORAL
  Filled 2021-10-18 (×4): qty 1

## 2021-10-18 NOTE — Group Note (Signed)
LCSW Group Therapy Note   Group Date: 10/18/2021 Start Time: 1300 End Time: 1400  Therapy Type: Group Therapy  Participation Level:  Minimal   Patients received a worksheet with an outline of 2 gingerbread men with a separation in the middle of the page. One sign designated what the pt sees about themselves and the other is what others see. Pts were asked to introduce themselves and share something they like about themself. Pts were then asked to draw, write or color how they view themselves as well as how they are viewed by others. CSW led discussion about the feelings and words associated with each side.   Patient Summary:   During introductions pt shared their name and stated they liked their paitence about themselves. Pt was appropriate but did participate in group discussion.   Pt did not attend.  Felizardo Hoffmann, LCSWA 10/18/2021  1:29 PM

## 2021-10-18 NOTE — Progress Notes (Signed)
[facility] MD Progress Note  10/18/2021 4:41 PM OSHAY DOCKEN  MRN:  [id number] Subjective:   The patient is a 65 year old female with a past psychiatric history of unspecified psychotic disorder presenting under IVC for paranoia and delusions. She believes that there are people in the woods near her house that have tried to shoot her.   The patient's chart was reviewed and nursing notes were reviewed. Over the past 24 hrs, there were no documented behavioral issues, no PRN medications given for agitation.The patient's case was discussed in multidisciplinary team meeting.  The patient was noted to have attended some groups.   The care team made the following medication changes yesterday: No changes made  On interview this morning, the patient appears calm and smiles appropriately. She has a linear and logical thought process. She appears improved from admission. Initially she reports "I am doing great". She reports eating and sleeping well. She reports doubt about her delusion that there are people in the woods near her sister's house who are out to get her. She denies suicidal thoughts, auditory/visual hallucinations and first rank symptoms.  Cogwheeling and rigidity noted in right upper extremity on physical exam.   Much discussion is devoted to her financial assets, as the patient was to be discharged to a shelter. Today she appears more clear minded and is able to relate that she receives a social security check that is sent to [employer] on [address] [place]. This information is relayed to social work.   Principal Problem: Acute psychosis ([place]) Diagnosis: Principal Problem:   Acute psychosis ([place]) Active Problems:   Mild cognitive impairment  Total Time Spent in Direct Patient Care: I personally spent 30 minutes on the unit in direct patient care. The direct patient care time included face-to-face time with the patient, reviewing the patient's chart, communicating with other professionals, and  coordinating care. Greater than 50% of this time was spent in counseling or coordinating care with the patient regarding goals of hospitalization, psycho-education, and discharge planning needs.   Past Psychiatric History: unspecified psychotic disorder  Past Medical History:  Past Medical History:  Diagnosis Date   Eye globe prosthesis    GERD (gastroesophageal reflux disease)    History of blood transfusion 1973   "related to abscess burst in my stomach"   Hyperlipemia    Hyperlipidemia    Hypertension    Osteoarthritis of back    Lowerback    SVD (spontaneous vaginal delivery)    x 1, baby died at 2 wks of age   Type II diabetes mellitus ([place])     Past Surgical History:  Procedure Laterality Date   APPENDECTOMY     COLONOSCOPY     DILATATION & CURETTAGE/HYSTEROSCOPY WITH MYOSURE N/A 02/25/2015   Procedure: DILATATION & CURETTAGE/HYSTEROSCOPY WITH MYOSURE;  Surgeon: Marylynn Pearson, MD;  Location: [facility];  Service: Gynecology;  Laterality: N/A;   DILATION AND CURETTAGE OF UTERUS     ENUCLEATION Right 03/16/2017   ENUCLEATION Right 03/16/2017   Procedure: ENUCLEATION RIGHT EYE;  Surgeon: Clista Bernhardt, MD;  Location: [facility];  Service: Ophthalmology;  Laterality: Right;   EYE SURGERY     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6, 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   EYE SURGERY Right ~ 1974   "S/P initial eye injury; scissors stuck in my eye""   LAPAROSCOPIC CHOLECYSTECTOMY     SHOULDER ARTHROSCOPY WITH ROTATOR CUFF REPAIR Left    wire stitches per patient   SHOULDER ARTHROSCOPY WITH ROTATOR CUFF REPAIR  Right    Family History:  Family History  Problem Relation Age of Onset   Diabetes Mother    CAD Mother    Lung cancer Father    Family Psychiatric  History: unknown Social History:  Social History   Substance and Sexual Activity  Alcohol Use Yes   Comment: 03/16/2017 "nothing since 2013"     Social History   Substance and Sexual Activity  Drug Use No    Social History   Socioeconomic History    Marital status: Widowed    Spouse name: Not on file   Number of children: Not on file   Years of education: Not on file   Highest education level: Not on file  Occupational History   Not on file  Tobacco Use   Smoking status: Former    Packs/day: 0.50    Years: 40.00    Pack years: 20.00    Types: Cigarettes    Quit date: 2016    Years since quitting: 6.8   Smokeless tobacco: Never  Vaping Use   Vaping Use: Never used  Substance and Sexual Activity   Alcohol use: Yes    Comment: 03/16/2017 "nothing since 2013"   Drug use: No   Sexual activity: Not Currently    Birth control/protection: Post-menopausal  Other Topics Concern   Not on file  Social History Narrative   Not on file   Social Determinants of Health   Financial Resource Strain: Not on file  Food Insecurity: Not on file  Transportation Needs: Not on file  Physical Activity: Not on file  Stress: Not on file  Social Connections: Not on file   Additional Social History:        Sleep: Good  Appetite:  Fair  Current Medications: Current Facility-Administered Medications  Medication Dose Route Frequency Provider Last Rate Last Admin   acetaminophen (TYLENOL) tablet 650 mg  650 mg Oral Q6H PRN Derrill Center, NP   650 mg at 10/18/21 0835   alum & mag hydroxide-simeth (MAALOX/MYLANTA) 200-200-20 MG/5ML suspension 30 mL  30 mL Oral Q4H PRN Derrill Center, NP       atenolol (TENORMIN) tablet 12.5 mg  12.5 mg Oral Daily Corky Sox, MD   12.5 mg at 10/18/21 [id number]   atorvastatin (LIPITOR) tablet 40 mg  40 mg Oral Daily Derrill Center, NP   40 mg at 10/18/21 [id number]   benztropine (COGENTIN) tablet 0.5 mg  0.5 mg Oral BID Corky Sox, MD       divalproex (DEPAKOTE) DR tablet 500 mg  500 mg Oral QHS Derrill Center, NP   500 mg at 10/17/21 2134   feeding supplement (BOOST / RESOURCE BREEZE) liquid 1 Container  1 Container Oral BID BM Massengill, Ovid Curd, MD   1 Container at 10/18/21 1410   hydrOXYzine  (ATARAX/VISTARIL) tablet 25 mg  25 mg Oral TID PRN Derrill Center, NP   25 mg at 10/17/21 2134   ibuprofen (ADVIL) tablet 600 mg  600 mg Oral TID PRN Bobbitt, Shalon E, NP   600 mg at 10/18/21 1558   lisinopril (ZESTRIL) tablet 10 mg  10 mg Oral Daily Corky Sox, MD   10 mg at 10/18/21 0835   magnesium hydroxide (MILK OF MAGNESIA) suspension 30 mL  30 mL Oral Daily PRN Derrill Center, NP   30 mL at 10/16/21 1533   methimazole (TAPAZOLE) tablet 10 mg  10 mg Oral Daily Corky Sox, MD   10 mg at  10/18/21 0833   pantoprazole (PROTONIX) EC tablet 40 mg  40 mg Oral BID AC Princess Bruins, DO   40 mg at 10/18/21 0622   risperiDONE (RISPERDAL) tablet 1 mg  1 mg Oral Daily Massengill, Nathan, MD   1 mg at 10/18/21 0865   risperiDONE (RISPERDAL) tablet 2 mg  2 mg Oral QHS Carlyn Reichert, MD   2 mg at 10/17/21 2134   traZODone (DESYREL) tablet 50 mg  50 mg Oral QHS PRN Oneta Rack, NP   50 mg at 10/17/21 2134    Lab Results:  No results found for this or any previous visit (from the past 48 hour(s)).    Blood Alcohol level:  Lab Results  Component Value Date   ETH <10 10/07/2021   ETH <10 08/10/2021    Metabolic Disorder Labs: Lab Results  Component Value Date   HGBA1C 5.6 10/07/2021   MPG 114.02 10/07/2021   No results found for: PROLACTIN Lab Results  Component Value Date   CHOL 253 (H) 10/07/2021   TRIG 99 10/07/2021   HDL 56 10/07/2021   CHOLHDL 4.5 10/07/2021   VLDL 20 10/07/2021   LDLCALC 177 (H) 10/07/2021    Physical Findings: AIMS: 0 on 11/10  Musculoskeletal: Strength & Muscle Tone: within normal limits Gait & Station:  not assessed Patient leans: NA  Psychiatric Specialty Exam:  Presentation  General Appearance: appropriate for environment, new clothes Eye Contact:Fair  Speech:normal rate and fluency  Speech Volume:Normal  Handedness:Right  Mood and Affect  Mood: "good" - appears calm  Affect: euthymic today  Thought Process  Thought  Processes:Goal Directed; Coherent  Descriptions of Associations: intact Orientation: oriented to person place, time and event  Thought Content: delusions have softened, denies suicidal thoughts and auditory/visual hallucinations and first rank symptoms  History of Schizophrenia/Schizoaffective disorder:Yes  Duration of Psychotic Symptoms:Greater than six months  Hallucinations: denies  Ideas of Reference: none  Suicidal Thoughts: denies  Homicidal Thoughts: none reported   Sensorium  Memory:Immediate Fair; Recent Fair; Remote Fair  Judgment:Fair  Insight:Lacking   Executive Functions  Concentration:Fair  Attention Span:Fair  Recall:Fair  Fund of Knowledge:Fair  Language:Fair   Psychomotor Activity  Psychomotor Activity: normal   Assets  Assets:Communication Skills; Desire for Improvement; Resilience   Sleep  Sleep: fair Patient slept Number of Hours: 7.75   Physical Exam Vitals reviewed.  Constitutional:      Appearance: [name] not ill-appearing or toxic-appearing.  HENT:     Head: Normocephalic.  Pulmonary:     Effort: Pulmonary effort is normal.  Musculoskeletal:     Comments: Cogwheeling and rigidity noted again on RUE  Neurological:     General: No focal deficit present.     Mental Status: [name] alert.   Review of Systems  Respiratory:  Negative for shortness of breath.   Cardiovascular:  Negative for chest pain.  Gastrointestinal:  Negative for nausea and vomiting.  Blood pressure 98/65, pulse 76, temperature 98.1 F (36.7 C), temperature source Oral, resp. rate 18, height 5\' 3"  (1.6 m), weight 60.8 kg, SpO2 100 %. Body mass index is 23.74 kg/m.   Treatment Plan Summary: Daily contact with patient to assess and evaluate symptoms and progress in treatment and Medication management  Assessment #Delusional d/o #UTI #Afib #hyperthyroidism #sciatica #GERD    Delusional d/o -Continue Risperdal 1 mg AM and 2 mg QHS, for  psychosis -Add Cogentin 0.5 mg BID for EPS rather than decrease Risperdal given patient's improvement -Continue home Depakote  500 mg for mood stabilization             -Depakote level of 0 on admission  -Repeat Depakote level of 30  -Given that the patient does not have significant mood lability or persistent irritability will continue at current dose Antipsychotic labs             EKG: sinus brady, some inverted T-waves, discussed with ED attending, Qtc of 427             Lipids: chol of 253, LDL 177 (collected at 2 AM)             A1C: 5.6 AIMS: 0 on 11/10, cogwheeling of R wrist noted, resolved on 11/12, re-emerged on 11/14   Medical Management Covid negative CMP: unremarkable CBC: unremarkable EtOH: <10 UDS: negative Upreg: NA TSH: 1.2 UA: leukocytes, negative nitrites   Abnormal UA Patient denies symptoms and patient has refused antibiotic saying that she does not have the symptoms of a UTI.  -Repeat UA on 11/10 shows trace leukocytes and 5 ketones otherwise WNL  HTN -Home lisinopril 10 mg  Hyperthyroidism A. fib Verified in endocrinology notes. Patient last filled her 79 d supply of methimazole on 7/6 and ran out one month ago. -Continue methimazole -Continue atenolol   Sciatica Offered patient gabapentin or Lyrica, however she says that she tried in the past and they have been ineffective. Discussed with patient trial with lidocaine patch to see if there is any relief. -Lidocaine patch  GERD -Protonix  Corky Sox, MD Psychiatry Resident, PGY-1 [facility] 10/18/2021, 4:41 PM

## 2021-10-18 NOTE — Group Note (Signed)
Occupational Therapy Group Note  Group Topic:Feelings Management  Group Date: 10/18/2021 Start Time: 1400 End Time: 1500 Facilitators: Azelyn Batie, OT/L    Group Description: Group encouraged increased engagement and participation through discussion focused on Building Happiness. Patients were provided a handout and reviewed therapeutic strategies to build happiness including identifying gratitudes, random acts of kindness, exercise, meditation, positive journaling, and fostering relationships. Patients engaged in discussion and encouraged to reflect on each strategy and their experiences.  Therapeutic Goal(s): Identify strategies to build happiness. Identify and implement therapeutic strategies to improve overall mood. Practice and identify gratitudes, random acts of kindness, exercise, meditation, positive journaling, and fostering relationships     Participation Level: Did not attend   Plan: Continue to engage patient in OT groups 2 - 3x/week.  10/18/2021  Shari [name], OT/L 

## 2021-10-18 NOTE — Progress Notes (Signed)
   10/17/21 2130  Psych Admission Type (Psych Patients Only)  Admission Status Involuntary  Psychosocial Assessment  Patient Complaints Worrying;Depression  Eye Contact Fair  Facial Expression Flat  Affect Appropriate to circumstance  Speech Logical/coherent  Interaction Minimal  Motor Activity Slow  Appearance/Hygiene Unremarkable  Behavior Characteristics Cooperative;Appropriate to situation  Mood Depressed;Preoccupied;Pleasant  Aggressive Behavior  Effect No apparent injury  Thought Process  Coherency Concrete thinking  Content WDL  Delusions None reported or observed  Perception WDL  Hallucination None reported or observed  Judgment Poor  Confusion None  Danger to Self  Current suicidal ideation? Denies  Danger to Others  Danger to Others None reported or observed

## 2021-10-18 NOTE — Group Note (Signed)
Recreation Therapy Group Note   Group Topic:Stress Management  Group Date: 10/18/2021 Start Time: 0930 End Time: 0945 Facilitators: Caroll Rancher, [place] Location: [address] Dayroom  Goal Area(s) Addresses:  Patient will identify positive stress management techniques. Patient will identify benefits of using stress management post d/c.  Group Description:  Meditation.  LRT played a meditation that focused on self trust and how we sometimes doubt ourselves due to past mistakes and what it takes to regain that confidence/trust in ones self.  Patients were to listen, follow along and let their breathing relax each person as much as possible.   Affect/Mood: N/A   Participation Level: Did not attend    Clinical Observations/Individualized Feedback: Pt did not attend group.    Plan: Continue to engage patient in RT group sessions 2-3x/week.   Caroll Rancher, LRT,CTRS 10/18/2021 12:18 PM

## 2021-10-18 NOTE — BHH Counselor (Signed)
CSW assisted this patient with calling her bank. PT was unable to obtain bank information via the phone and was told she would need to come in person. Pt was told that she could withdraw money or get a check to pay for a hotel at discharge.      Ruthann Cancer MSW, LCSW Clincal Social Worker  [facility]

## 2021-10-18 NOTE — Progress Notes (Signed)
Patient did nto attend wrap up group

## 2021-10-18 NOTE — Progress Notes (Signed)
Patient is worried about having to go to a shelter once discharged. She reports she has a sister in [place] but does not have her phone number. She was isolative to her room tonight but reports attending 3 groups during the day.

## 2021-10-18 NOTE — BHH Group Notes (Signed)
Pt did not attend group. 

## 2021-10-18 NOTE — BH IP Treatment Plan (Signed)
Interdisciplinary Treatment and Diagnostic Plan Update  10/18/2021 Time of Session: 11:00am Shari [name] MRN: [id number]  Principal Diagnosis: Acute psychosis [facility])  Secondary Diagnoses: Principal Problem:   Acute psychosis (HCC)   Current Medications:  Current Facility-Administered Medications  Medication Dose Route Frequency Provider Last Rate Last Admin   acetaminophen (TYLENOL) tablet 650 mg  650 mg Oral Q6H PRN Oneta Rack, NP   650 mg at 10/18/21 0835   alum & mag hydroxide-simeth (MAALOX/MYLANTA) 200-200-20 MG/5ML suspension 30 mL  30 mL Oral Q4H PRN Oneta Rack, NP       atenolol (TENORMIN) tablet 12.5 mg  12.5 mg Oral Daily Carlyn Reichert, MD   12.5 mg at 10/18/21 5093   atorvastatin (LIPITOR) tablet 40 mg  40 mg Oral Daily Oneta Rack, NP   40 mg at 10/18/21 2671   divalproex (DEPAKOTE) DR tablet 500 mg  500 mg Oral QHS Oneta Rack, NP   500 mg at 10/17/21 2134   feeding supplement (BOOST / RESOURCE BREEZE) liquid 1 Container  1 Container Oral BID BM Massengill, Harrold Donath, MD   1 Container at 10/17/21 1444   hydrOXYzine (ATARAX/VISTARIL) tablet 25 mg  25 mg Oral TID PRN Oneta Rack, NP   25 mg at 10/17/21 2134   ibuprofen (ADVIL) tablet 600 mg  600 mg Oral TID PRN Bobbitt, Shalon E, NP   600 mg at 10/17/21 2136   lisinopril (ZESTRIL) tablet 10 mg  10 mg Oral Daily Carlyn Reichert, MD   10 mg at 10/18/21 0835   magnesium hydroxide (MILK OF MAGNESIA) suspension 30 mL  30 mL Oral Daily PRN Oneta Rack, NP   30 mL at 10/16/21 1533   methimazole (TAPAZOLE) tablet 10 mg  10 mg Oral Daily Carlyn Reichert, MD   10 mg at 10/18/21 0833   pantoprazole (PROTONIX) EC tablet 40 mg  40 mg Oral BID AC Princess Bruins, DO   40 mg at 10/18/21 0622   risperiDONE (RISPERDAL) tablet 1 mg  1 mg Oral Daily Massengill, Nathan, MD   1 mg at 10/18/21 2458   risperiDONE (RISPERDAL) tablet 2 mg  2 mg Oral QHS Carlyn Reichert, MD   2 mg at 10/17/21 2134   traZODone (DESYREL) tablet 50  mg  50 mg Oral QHS PRN Oneta Rack, NP   50 mg at 10/17/21 2134   PTA Medications: Medications Prior to Admission  Medication Sig Dispense Refill Last Dose   atenolol (TENORMIN) 25 MG tablet Take 0.5 tablets (12.5 mg total) by mouth daily.      atorvastatin (LIPITOR) 40 MG tablet Take 40 mg by mouth daily.      ciprofloxacin (CIPRO) 500 MG tablet Take 1 tablet (500 mg total) by mouth 2 (two) times daily.      divalproex (DEPAKOTE ER) 250 MG 24 hr tablet Take 750 mg by mouth at bedtime.      ibuprofen (ADVIL) 800 MG tablet Take 800 mg by mouth 3 (three) times daily.      lisinopril (PRINIVIL,ZESTRIL) 10 MG tablet Take 10 mg by mouth daily.       methimazole (TAPAZOLE) 10 MG tablet Take 10 mg by mouth daily.      pantoprazole (PROTONIX) 40 MG tablet Take 40 mg by mouth 2 (two) times daily.       risperiDONE (RISPERDAL) 1 MG tablet Take 1 tablet (1 mg total) by mouth at bedtime.      traZODone (DESYREL) 50 MG tablet  Take 1 tablet (50 mg total) by mouth at bedtime as needed for sleep.       Patient Stressors: Marital or family conflict   Medication change or noncompliance    Patient Strengths: Physical Health  Religious Affiliation  Supportive family/friends   Treatment Modalities: Medication Management, Group therapy, Case management,  1 to 1 session with clinician, Psychoeducation, Recreational therapy.   Physician Treatment Plan for Primary Diagnosis: Acute psychosis (HCC) Long Term Goal(s): Improvement in symptoms so as ready for discharge   Short Term Goals: Ability to maintain clinical measurements within normal limits will improve Compliance with prescribed medications will improve  Medication Management: Evaluate patient's response, side effects, and tolerance of medication regimen.  Therapeutic Interventions: 1 to 1 sessions, Unit Group sessions and Medication administration.  Evaluation of Outcomes: Progressing  Physician Treatment Plan for Secondary Diagnosis:  Principal Problem:   Acute psychosis (HCC)  Long Term Goal(s): Improvement in symptoms so as ready for discharge   Short Term Goals: Ability to maintain clinical measurements within normal limits will improve Compliance with prescribed medications will improve     Medication Management: Evaluate patient's response, side effects, and tolerance of medication regimen.  Therapeutic Interventions: 1 to 1 sessions, Unit Group sessions and Medication administration.  Evaluation of Outcomes: Progressing   RN Treatment Plan for Primary Diagnosis: Acute psychosis (HCC) Long Term Goal(s): Knowledge of disease and therapeutic regimen to maintain health will improve  Short Term Goals: Ability to remain free from injury will improve, Ability to verbalize frustration and anger appropriately will improve, Ability to demonstrate self-control, Ability to participate in decision making will improve, Ability to identify and develop effective coping behaviors will improve, and Compliance with prescribed medications will improve  Medication Management: RN will administer medications as ordered by provider, will assess and evaluate patient's response and provide education to patient for prescribed medication. RN will report any adverse and/or side effects to prescribing provider.  Therapeutic Interventions: 1 on 1 counseling sessions, Psychoeducation, Medication administration, Evaluate responses to treatment, Monitor vital signs and CBGs as ordered, Perform/monitor CIWA, COWS, AIMS and Fall Risk screenings as ordered, Perform wound care treatments as ordered.  Evaluation of Outcomes: Progressing   LCSW Treatment Plan for Primary Diagnosis: Acute psychosis (HCC) Long Term Goal(s): Safe transition to appropriate next level of care at discharge, Engage patient in therapeutic group addressing interpersonal concerns.  Short Term Goals: Engage patient in aftercare planning with referrals and resources, Increase  social support, Increase ability to appropriately verbalize feelings, Identify triggers associated with mental health/substance abuse issues, and Increase skills for wellness and recovery  Therapeutic Interventions: Assess for all discharge needs, 1 to 1 time with Social worker, Explore available resources and support systems, Assess for adequacy in community support network, Educate family and significant other(s) on suicide prevention, Complete Psychosocial Assessment, Interpersonal group therapy.  Evaluation of Outcomes: Progressing   Progress in Treatment: Attending groups: No. Participating in groups: No. Taking medication as prescribed: Yes. Toleration medication: Yes. Family/Significant other contact made: No, will contact:  Declined Contacts  Patient understands diagnosis: No. Discussing patient identified problems/goals with staff: Yes. Medical problems stabilized or resolved: Yes. Denies suicidal/homicidal ideation: Yes. Issues/concerns per patient self-inventory: No.     New problem(s) identified: No, Describe:  None    New Short Term/Long Term Goal(s): medication stabilization, elimination of SI thoughts, development of comprehensive mental wellness plan.    Patient Goals: Did not attend    Discharge Plan or Barriers: Patient is to stay in  hotel or shelter at discharge. Patient is to follow up with [facility] at discharge   Reason for Continuation of Hospitalization: Anxiety Depression Medication stabilization   Estimated Length of Stay: 1-3 days    Scribe for Treatment Team: Otelia Santee, LCSW 10/18/2021 11:47 AM

## 2021-10-19 MED ORDER — BENZTROPINE MESYLATE 0.5 MG PO TABS
0.5000 mg | ORAL_TABLET | Freq: Once | ORAL | Status: AC
  Administered 2021-10-19: 0.5 mg via ORAL
  Filled 2021-10-19: qty 1

## 2021-10-19 MED ORDER — BENZTROPINE MESYLATE 1 MG PO TABS
1.0000 mg | ORAL_TABLET | Freq: Two times a day (BID) | ORAL | Status: DC
  Administered 2021-10-19 – 2021-10-20 (×3): 1 mg via ORAL
  Filled 2021-10-19 (×6): qty 1

## 2021-10-19 NOTE — Plan of Care (Addendum)
Patient was a possible discharge today. When asked about discharge she states, "I'm not too happy about it." I asked was she unhappy because she wasn't being discharged today, and she answered, "I don't have anywhere to go yet."  °

## 2021-10-19 NOTE — Progress Notes (Signed)
   10/19/21 2100  Psych Admission Type (Psych Patients Only)  Admission Status Involuntary  Psychosocial Assessment  Patient Complaints None  Eye Contact Fair  Facial Expression Animated  Affect Appropriate to circumstance  Speech Logical/coherent  Interaction Minimal  Motor Activity Slow  Appearance/Hygiene Unremarkable  Behavior Characteristics Cooperative  Mood Anxious  Thought Process  Coherency Concrete thinking  Content WDL  Delusions None reported or observed  Perception WDL  Hallucination None reported or observed  Judgment Poor  Confusion None  Danger to Self  Current suicidal ideation? Denies  Danger to Others  Danger to Others None reported or observed

## 2021-10-19 NOTE — Progress Notes (Signed)
[facility] MD Progress Note  10/19/2021 10:23 AM Shari [name]  MRN:  [id number] Subjective:   The patient is a 65 year old female with a past psychiatric history of unspecified psychotic disorder presenting under IVC for paranoia and delusions. She believes that there are people in the woods near her house that have tried to shoot her.   The patient's chart was reviewed and nursing notes were reviewed. Over the past 24 hrs, there were no documented behavioral issues, no PRN medications given for agitation.The patient's case was discussed in multidisciplinary team meeting.  The patient was noted to have attended some groups.  Upon further discussion with social work, the discharge plan now involves transporting the patient to the bank so that she can withdraw money and stay at a hotel.  The care team made the following medication changes yesterday: -Added cogentin 0.5 mg BID  On interview this morning, the patient appears calm and smiles appropriately. She has a linear and logical thought process.  She reports that her mood is "good".  She reports that she has been sleeping well and her appetite is good.  She reports going to groups and engaging in these.  Overall, she reports that she has improved over the course of the hospitalization.  Right-sided cogwheeling is noted despite the addition of Cogentin yesterday.  The patient is amenable to staying 1 more day.  She denies suicidal thoughts and auditory/visual hallucinations and first rank symptoms.  She denies medication side effects.  Principal Problem: Acute psychosis ([place]) Diagnosis: Principal Problem:   Acute psychosis ([place]) Active Problems:   Mild cognitive impairment  Total Time Spent in Direct Patient Care: I personally spent 30 minutes on the unit in direct patient care. The direct patient care time included face-to-face time with the patient, reviewing the patient's chart, communicating with other professionals, and coordinating care. Greater  than 50% of this time was spent in counseling or coordinating care with the patient regarding goals of hospitalization, psycho-education, and discharge planning needs.   Past Psychiatric History: unspecified psychotic disorder  Past Medical History:  Past Medical History:  Diagnosis Date   Eye globe prosthesis    GERD (gastroesophageal reflux disease)    History of blood transfusion 1973   "related to abscess burst in my stomach"   Hyperlipemia    Hyperlipidemia    Hypertension    Osteoarthritis of back    Lowerback    SVD (spontaneous vaginal delivery)    x 1, baby died at 2 wks of age   Type II diabetes mellitus ([place])     Past Surgical History:  Procedure Laterality Date   APPENDECTOMY     COLONOSCOPY     DILATATION & CURETTAGE/HYSTEROSCOPY WITH MYOSURE N/A 02/25/2015   Procedure: DILATATION & CURETTAGE/HYSTEROSCOPY WITH MYOSURE;  Surgeon: Marylynn Pearson, MD;  Location: [facility];  Service: Gynecology;  Laterality: N/A;   DILATION AND CURETTAGE OF UTERUS     ENUCLEATION Right 03/16/2017   ENUCLEATION Right 03/16/2017   Procedure: ENUCLEATION RIGHT EYE;  Surgeon: Clista Bernhardt, MD;  Location: [facility];  Service: Ophthalmology;  Laterality: Right;   EYE SURGERY     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6, 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   EYE SURGERY Right ~ 1974   "S/P initial eye injury; scissors stuck in my eye""   LAPAROSCOPIC CHOLECYSTECTOMY     SHOULDER ARTHROSCOPY WITH ROTATOR CUFF REPAIR Left    wire stitches per patient   SHOULDER ARTHROSCOPY WITH ROTATOR CUFF REPAIR Right  Family History:  Family History  Problem Relation Age of Onset   Diabetes Mother    CAD Mother    Lung cancer Father    Family Psychiatric  History: unknown Social History:  Social History   Substance and Sexual Activity  Alcohol Use Yes   Comment: 03/16/2017 "nothing since 2013"     Social History   Substance and Sexual Activity  Drug Use No    Social History   Socioeconomic History   Marital status: Widowed     Spouse name: Not on file   Number of children: Not on file   Years of education: Not on file   Highest education level: Not on file  Occupational History   Not on file  Tobacco Use   Smoking status: Former    Packs/day: 0.50    Years: 40.00    Pack years: 20.00    Types: Cigarettes    Quit date: 2016    Years since quitting: 6.8   Smokeless tobacco: Never  Vaping Use   Vaping Use: Never used  Substance and Sexual Activity   Alcohol use: Yes    Comment: 03/16/2017 "nothing since 2013"   Drug use: No   Sexual activity: Not Currently    Birth control/protection: Post-menopausal  Other Topics Concern   Not on file  Social History Narrative   Not on file   Social Determinants of Health   Financial Resource Strain: Not on file  Food Insecurity: Not on file  Transportation Needs: Not on file  Physical Activity: Not on file  Stress: Not on file  Social Connections: Not on file   Additional Social History:        Sleep: Good  Appetite:  Fair  Current Medications: Current Facility-Administered Medications  Medication Dose Route Frequency Provider Last Rate Last Admin   acetaminophen (TYLENOL) tablet 650 mg  650 mg Oral Q6H PRN Oneta Rack, NP   650 mg at 10/18/21 0835   alum & mag hydroxide-simeth (MAALOX/MYLANTA) 200-200-20 MG/5ML suspension 30 mL  30 mL Oral Q4H PRN Oneta Rack, NP       atenolol (TENORMIN) tablet 12.5 mg  12.5 mg Oral Daily Carlyn Reichert, MD   12.5 mg at 10/19/21 0825   atorvastatin (LIPITOR) tablet 40 mg  40 mg Oral Daily Oneta Rack, NP   40 mg at 10/19/21 0825   benztropine (COGENTIN) tablet 0.5 mg  0.5 mg Oral Once Massengill, Harrold Donath, MD       benztropine (COGENTIN) tablet 1 mg  1 mg Oral BID Massengill, Nathan, MD       divalproex (DEPAKOTE) DR tablet 500 mg  500 mg Oral QHS Oneta Rack, NP   500 mg at 10/18/21 2150   feeding supplement (BOOST / RESOURCE BREEZE) liquid 1 Container  1 Container Oral BID BM Phineas Inches,  MD   1 Container at 10/19/21 0828   hydrOXYzine (ATARAX/VISTARIL) tablet 25 mg  25 mg Oral TID PRN Oneta Rack, NP   25 mg at 10/17/21 2134   ibuprofen (ADVIL) tablet 600 mg  600 mg Oral TID PRN Bobbitt, Shalon E, NP   600 mg at 10/19/21 0827   lisinopril (ZESTRIL) tablet 10 mg  10 mg Oral Daily Carlyn Reichert, MD   10 mg at 10/19/21 0826   magnesium hydroxide (MILK OF MAGNESIA) suspension 30 mL  30 mL Oral Daily PRN Oneta Rack, NP   30 mL at 10/16/21 1533   methimazole (TAPAZOLE) tablet  10 mg  10 mg Oral Daily Corky Sox, MD   10 mg at 10/19/21 0826   pantoprazole (PROTONIX) EC tablet 40 mg  40 mg Oral BID AC Merrily Brittle, DO   40 mg at 10/19/21 [id number]   risperiDONE (RISPERDAL) tablet 1 mg  1 mg Oral Daily Massengill, Nathan, MD   1 mg at 10/19/21 0825   risperiDONE (RISPERDAL) tablet 2 mg  2 mg Oral QHS Corky Sox, MD   2 mg at 10/18/21 2150   traZODone (DESYREL) tablet 50 mg  50 mg Oral QHS PRN Derrill Center, NP   50 mg at 10/18/21 2150    Lab Results:  No results found for this or any previous visit (from the past 41 hour(s)).    Blood Alcohol level:  Lab Results  Component Value Date   ETH <10 10/07/2021   ETH <10 99991111    Metabolic Disorder Labs: Lab Results  Component Value Date   HGBA1C 5.6 10/07/2021   MPG 114.02 10/07/2021   No results found for: PROLACTIN Lab Results  Component Value Date   CHOL 253 (H) 10/07/2021   TRIG 99 10/07/2021   HDL 56 10/07/2021   CHOLHDL 4.5 10/07/2021   VLDL 20 10/07/2021   LDLCALC 177 (H) 10/07/2021    Physical Findings: AIMS: 0 on 11/10  Musculoskeletal: Strength & Muscle Tone: within normal limits Gait & Station:  not assessed Patient leans: NA  Psychiatric Specialty Exam:  Presentation  General Appearance: appropriate for environment, new clothes Eye Contact:Fair  Speech:normal rate and fluency  Speech Volume:Normal  Handedness:Right  Mood and Affect  Mood: "good" - appears calm   Affect: euthymic  Thought Process  Thought Processes:Goal Directed; Coherent  Descriptions of Associations: intact Orientation: oriented to person place, time and event  Thought Content: delusions have softened, denies suicidal thoughts and auditory/visual hallucinations and first rank symptoms  History of Schizophrenia/Schizoaffective disorder:Yes  Duration of Psychotic Symptoms:Greater than six months  Hallucinations: denies  Ideas of Reference: none  Suicidal Thoughts: denies  Homicidal Thoughts: none reported   Sensorium  Memory:Immediate Fair; Recent Fair; Remote Joyce   Executive Functions  Concentration:Fair  Attention Span:Fair  [place]   Psychomotor Activity  Psychomotor Activity: normal   Assets  Assets:Communication Skills; Desire for Improvement; Resilience   Sleep  Sleep: fair Patient slept Number of Hours: 6   Physical Exam Vitals reviewed.  Constitutional:      Appearance: [name] not ill-appearing or toxic-appearing.  HENT:     Head: Normocephalic.  Pulmonary:     Effort: Pulmonary effort is normal.  Musculoskeletal:     Comments: Cogwheeling and rigidity noted again on RUE  Neurological:     General: No focal deficit present.     Mental Status: [name] alert.   Review of Systems  Respiratory:  Negative for shortness of breath.   Cardiovascular:  Negative for chest pain.  Gastrointestinal:  Negative for nausea and vomiting.  Blood pressure 116/67, pulse 99, temperature 98.4 F (36.9 C), temperature source Oral, resp. rate 16, height 5\' 3"  (1.6 m), weight 60.8 kg, SpO2 100 %. Body mass index is 23.74 kg/m.   Treatment Plan Summary: Daily contact with patient to assess and evaluate symptoms and progress in treatment and Medication management  Assessment #Delusional d/o #UTI #Afib #hyperthyroidism #sciatica #GERD    Delusional d/o -Continue  Risperdal 1 mg AM and 2 mg QHS, for psychosis -Cogentin 0.5 mg  x1 for EPS -Increase Cogentin to 1 mg BID for EPS rather than decrease Risperdal given patient's improvement -Continue home Depakote 500 mg for mood stabilization             -Depakote level of 0 on admission  -Repeat Depakote level of 30  -Given that the patient does not have significant mood lability or persistent irritability will continue at current dose Antipsychotic labs             EKG: sinus brady, some inverted T-waves, discussed with ED attending, Qtc of 427             Lipids: chol of 253, LDL 177 (collected at 2 AM)             A1C: 5.6 AIMS: 0 on 11/10, cogwheeling of R wrist noted, resolved on 11/12, re-emerged on 11/14, still present on 11/15 despite 0.5 mg Cogentin.   Medical Management Covid negative CMP: unremarkable CBC: unremarkable EtOH: <10 UDS: negative Upreg: NA TSH: 1.2 UA: leukocytes, negative nitrites   Abnormal UA Patient denies symptoms and patient has refused antibiotic saying that she does not have the symptoms of a UTI.  -Repeat UA on 11/10 shows trace leukocytes and 5 ketones otherwise WNL  HTN -Home lisinopril 10 mg  Hyperthyroidism A. fib Verified in endocrinology notes. Patient last filled her 13 d supply of methimazole on 7/6 and ran out one month ago. -Continue methimazole -Continue atenolol   Sciatica Offered patient gabapentin or Lyrica, however she says that she tried in the past and they have been ineffective. Discussed with patient trial with lidocaine patch to see if there is any relief. -Lidocaine patch  GERD -Protonix  Corky Sox, MD Psychiatry Resident, PGY-1 [facility] 10/19/2021, 10:23 AM

## 2021-10-19 NOTE — Progress Notes (Signed)
D: Patient is quiet; prefers to stay mostly in her room. Patient does not appear to be responding to internal stimuli. She denies any depressive symptoms or hopelessness. When asked about possible discharge, she states that she has nowhere to go.   A: Continue to monitor medication management and MD orders.  Safety checks completed every 15 minutes per protocol.  Offer support and encouragement as needed.  R: Patient is receptive to staff; she remains isolative.  10/19/21 1000  Psych Admission Type (Psych Patients Only)  Admission Status Involuntary  Psychosocial Assessment  Patient Complaints None  Eye Contact Fair  Facial Expression Animated  Affect Appropriate to circumstance  Speech Logical/coherent  Interaction Minimal  Motor Activity Slow  Appearance/Hygiene Unremarkable  Behavior Characteristics Cooperative  Mood Anxious  Thought Process  Coherency Concrete thinking  Content WDL  Delusions None reported or observed  Perception WDL  Hallucination None reported or observed  Judgment Poor  Confusion None  Danger to Self  Current suicidal ideation? Denies  Danger to Others  Danger to Others None reported or observed

## 2021-10-19 NOTE — Progress Notes (Signed)
Tonight's wrap up group covered the daily reflection sheet. The patient rated her day overall an 8. The patient identified her goal for the day as being able to speak with the Dr regarding her discharge plan which she said that she was able to achieve her goal. Patient was pleasant and appropriate during group.

## 2021-10-19 NOTE — Plan of Care (Incomplete Revision)
Patient was a possible discharge today. When asked about discharge she states, "I'm not too happy about it." I asked was she unhappy because she wasn't being discharged today, and she answered, "I don't have anywhere to go yet."

## 2021-10-19 NOTE — [facility] Group Notes (Signed)
The focus of this group is to help patients establish daily goals to achieve during treatment and discuss how the patient can incorporate goal setting into their daily lives to aide in recovery.  Pt did not attend morning goals group.  

## 2021-10-19 NOTE — Progress Notes (Signed)
Pt continues to isolate to the room.    10/18/21 0941  Psych Admission Type (Psych Patients Only)  Admission Status Involuntary  Psychosocial Assessment  Patient Complaints Depression;Worrying  Eye Contact Fair  Facial Expression Flat  Affect Appropriate to circumstance  Speech Logical/coherent  Interaction Minimal  Motor Activity Slow  Appearance/Hygiene Unremarkable  Behavior Characteristics Cooperative  Mood Preoccupied;Anxious;Depressed  Aggressive Behavior  Effect No apparent injury  Thought Process  Coherency Concrete thinking  Content WDL  Delusions None reported or observed  Perception WDL  Hallucination None reported or observed  Judgment Poor  Confusion None  Danger to Self  Current suicidal ideation? Denies  Danger to Others  Danger to Others None reported or observed

## 2021-10-20 DIAGNOSIS — F209 Schizophrenia, unspecified: Principal | ICD-10-CM

## 2021-10-20 DIAGNOSIS — F200 Paranoid schizophrenia: Secondary | ICD-10-CM

## 2021-10-20 MED ORDER — BENZTROPINE MESYLATE 1 MG PO TABS
1.0000 mg | ORAL_TABLET | Freq: Two times a day (BID) | ORAL | Status: DC
  Administered 2021-10-21: 08:00:00 1 mg via ORAL
  Filled 2021-10-20 (×3): qty 1

## 2021-10-20 MED ORDER — RISPERIDONE 1 MG PO TABS
1.0000 mg | ORAL_TABLET | Freq: Every day | ORAL | Status: DC
  Filled 2021-10-20 (×2): qty 1

## 2021-10-20 MED ORDER — OLANZAPINE 5 MG PO TABS
5.0000 mg | ORAL_TABLET | Freq: Every day | ORAL | Status: DC
  Administered 2021-10-20: 5 mg via ORAL
  Filled 2021-10-20 (×3): qty 1

## 2021-10-20 NOTE — [facility] Group Notes (Signed)
[facility] Group Notes:  (Nursing/MHT/Case Management/Adjunct)  Date:  10/20/2021  Time:  9:49 PM  Type of Therapy:  Psychoeducational Skills  Participation Level:  None  Participation Quality:  Resistant  Affect:  Resistant  Cognitive:  Lacking  Insight:  None  Engagement in Group:  None  Modes of Intervention:  Education  Summary of Progress/Problems: The patient did not attend group this evening.   Hazle Coca S 10/20/2021, 9:49 PM

## 2021-10-20 NOTE — Care Management Important Message (Signed)
Medicare IM printed and given to social work to give to the patient. ?

## 2021-10-20 NOTE — Progress Notes (Addendum)
[facility] MD Progress Note  10/20/2021 11:36 AM ELLODY WELTZIN  MRN:  [id number] Subjective:   The patient is a 65 year old female with a past psychiatric history of unspecified psychotic disorder presenting under IVC for paranoia and delusions. She believes that there are people in the woods near her house that have tried to shoot her.   The patient's chart was reviewed and nursing notes were reviewed. Over the past 24 hrs, there were no documented behavioral issues, no PRN medications given for agitation.The patient's case was discussed in multidisciplinary team meeting.  The patient was noted to have attended some groups.  Plan for patient is to see that she does have a debit card in her tote bag that she brought to [place] which had been previously a barrier for discharge.  Should she have a debit card, patient will be able to have money to arrange for housing.  The care team made the following medication changes yesterday: -Increase Cogentin 1.0 mg twice daily  On interview this morning, the patient appears calm and smiles appropriately. She has a linear and logical thought process.  She reports that her mood is "good".  She reports that she has been sleeping well and her appetite is good.  She reports going to groups and engaging in these.  Overall, she reports that she has improved over the course of the hospitalization.  Patient denies paranoia and concern that people are coming after her.   Right-sided cogwheeling is noted despite the increase in Cogentin.  Patient is amenable to continuing hospitalization for management of this adverse effect.    Principal Problem: Acute psychosis ([place]) Diagnosis: Principal Problem:   Acute psychosis ([place]) Active Problems:   Mild cognitive impairment    Past Psychiatric History: schizophrenia   Past Medical History:  Past Medical History:  Diagnosis Date   Eye globe prosthesis    GERD (gastroesophageal reflux disease)    History of blood transfusion  1973   "related to abscess burst in my stomach"   Hyperlipemia    Hyperlipidemia    Hypertension    Osteoarthritis of back    Lowerback    SVD (spontaneous vaginal delivery)    x 1, baby died at 2 wks of age   Type II diabetes mellitus ([place])     Past Surgical History:  Procedure Laterality Date   APPENDECTOMY     COLONOSCOPY     DILATATION & CURETTAGE/HYSTEROSCOPY WITH MYOSURE N/A 02/25/2015   Procedure: DILATATION & CURETTAGE/HYSTEROSCOPY WITH MYOSURE;  Surgeon: Marylynn Pearson, MD;  Location: [facility];  Service: Gynecology;  Laterality: N/A;   DILATION AND CURETTAGE OF UTERUS     ENUCLEATION Right 03/16/2017   ENUCLEATION Right 03/16/2017   Procedure: ENUCLEATION RIGHT EYE;  Surgeon: Clista Bernhardt, MD;  Location: [place];  Service: Ophthalmology;  Laterality: Right;   EYE SURGERY     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6, 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   EYE SURGERY Right ~ 1974   "S/P initial eye injury; scissors stuck in my eye""   LAPAROSCOPIC CHOLECYSTECTOMY     SHOULDER ARTHROSCOPY WITH ROTATOR CUFF REPAIR Left    wire stitches per patient   SHOULDER ARTHROSCOPY WITH ROTATOR CUFF REPAIR Right    Family History:  Family History  Problem Relation Age of Onset   Diabetes Mother    CAD Mother    Lung cancer Father    Family Psychiatric  History: unknown Social History:  Social History   Substance and Sexual  Activity  Alcohol Use Yes   Comment: 03/16/2017 "nothing since 2013"     Social History   Substance and Sexual Activity  Drug Use No    Social History   Socioeconomic History   Marital status: Widowed    Spouse name: Not on file   Number of children: Not on file   Years of education: Not on file   Highest education level: Not on file  Occupational History   Not on file  Tobacco Use   Smoking status: Former    Packs/day: 0.50    Years: 40.00    Pack years: 20.00    Types: Cigarettes    Quit date: 2016    Years since quitting: 6.8   Smokeless tobacco: Never  Vaping Use    Vaping Use: Never used  Substance and Sexual Activity   Alcohol use: Yes    Comment: 03/16/2017 "nothing since 2013"   Drug use: No   Sexual activity: Not Currently    Birth control/protection: Post-menopausal  Other Topics Concern   Not on file  Social History Narrative   Not on file   Social Determinants of Health   Financial Resource Strain: Not on file  Food Insecurity: Not on file  Transportation Needs: Not on file  Physical Activity: Not on file  Stress: Not on file  Social Connections: Not on file   Additional Social History:  Specify valuables returned: see belongings sheet     Sleep: Good  Appetite:  Fair  Current Medications: Current Facility-Administered Medications  Medication Dose Route Frequency Provider Last Rate Last Admin   acetaminophen (TYLENOL) tablet 650 mg  650 mg Oral Q6H PRN Oneta Rack, NP   650 mg at 10/18/21 0835   alum & mag hydroxide-simeth (MAALOX/MYLANTA) 200-200-20 MG/5ML suspension 30 mL  30 mL Oral Q4H PRN Oneta Rack, NP       atenolol (TENORMIN) tablet 12.5 mg  12.5 mg Oral Daily Carlyn Reichert, MD   12.5 mg at 10/20/21 9562   atorvastatin (LIPITOR) tablet 40 mg  40 mg Oral Daily Oneta Rack, NP   40 mg at 10/20/21 1308   benztropine (COGENTIN) tablet 1 mg  1 mg Oral BID Deanta Mincey, Harrold Donath, MD   1 mg at 10/20/21 6578   divalproex (DEPAKOTE) DR tablet 500 mg  500 mg Oral QHS Oneta Rack, NP   500 mg at 10/19/21 2116   feeding supplement (BOOST / RESOURCE BREEZE) liquid 1 Container  1 Container Oral BID BM Phineas Inches, MD   1 Container at 10/20/21 0920   hydrOXYzine (ATARAX/VISTARIL) tablet 25 mg  25 mg Oral TID PRN Oneta Rack, NP   25 mg at 10/17/21 2134   ibuprofen (ADVIL) tablet 600 mg  600 mg Oral TID PRN Bobbitt, Shalon E, NP   600 mg at 10/20/21 0824   lisinopril (ZESTRIL) tablet 10 mg  10 mg Oral Daily Carlyn Reichert, MD   10 mg at 10/20/21 4696   magnesium hydroxide (MILK OF MAGNESIA) suspension 30 mL   30 mL Oral Daily PRN Oneta Rack, NP   30 mL at 10/16/21 1533   methimazole (TAPAZOLE) tablet 10 mg  10 mg Oral Daily Carlyn Reichert, MD   10 mg at 10/20/21 0823   pantoprazole (PROTONIX) EC tablet 40 mg  40 mg Oral BID AC Princess Bruins, DO   40 mg at 10/20/21 2952   risperiDONE (RISPERDAL) tablet 1 mg  1 mg Oral Daily Rayne Loiseau, Harrold Donath, MD  1 mg at 10/20/21 [id number]   risperiDONE (RISPERDAL) tablet 1 mg  1 mg Oral QHS France Ravens, MD       traZODone (DESYREL) tablet 50 mg  50 mg Oral QHS PRN Derrill Center, NP   50 mg at 10/18/21 2150    Lab Results:  No results found for this or any previous visit (from the past 48 hour(s)).    Blood Alcohol level:  Lab Results  Component Value Date   ETH <10 10/07/2021   ETH <10 99991111    Metabolic Disorder Labs: Lab Results  Component Value Date   HGBA1C 5.6 10/07/2021   MPG 114.02 10/07/2021   No results found for: PROLACTIN Lab Results  Component Value Date   CHOL 253 (H) 10/07/2021   TRIG 99 10/07/2021   HDL 56 10/07/2021   CHOLHDL 4.5 10/07/2021   VLDL 20 10/07/2021   LDLCALC 177 (H) 10/07/2021    Physical Findings: AIMS: 0 on 11/10  Musculoskeletal: Strength & Muscle Tone: within normal limits Gait & Station:  not assessed Patient leans: NA  Psychiatric Specialty Exam:  Presentation  General Appearance: appropriate for environment, new clothes Eye Contact:Fair  Speech:normal rate and fluency  Speech Volume:Normal  Handedness:Right  Mood and Affect  Mood: "good" Affect: euthymic  Thought Process  Thought Processes:Goal Directed; Coherent  Descriptions of Associations: intact Orientation: oriented to person place, time and event  Thought Content: denies suicidal thoughts and auditory/visual hallucinations and first rank symptoms  History of Schizophrenia/Schizoaffective disorder:Yes  Duration of Psychotic Symptoms:Greater than six months  Hallucinations: denies  Ideas of Reference:  none  Suicidal Thoughts: denies  Homicidal Thoughts: none reported   Sensorium  Memory:Immediate Fair; Recent Fair; Remote [place]   Executive Functions  Concentration:Fair  Attention Span:Fair  [place]   Psychomotor Activity  Psychomotor Activity: normal   Assets  Assets:Communication Skills; Desire for Improvement; Resilience   Sleep  Sleep: fair Patient slept Number of Hours: 6.5   Physical Exam Vitals reviewed.  Constitutional:      Appearance: [name] not ill-appearing or toxic-appearing.  HENT:     Head: Normocephalic.  Pulmonary:     Effort: Pulmonary effort is normal.  Musculoskeletal:     Comments: Cogwheeling and rigidity noted again on RUE  Neurological:     General: No focal deficit present.     Mental Status: [name] alert.   Review of Systems  Respiratory:  Negative for shortness of breath.   Cardiovascular:  Negative for chest pain.  Gastrointestinal:  Negative for nausea and vomiting.  Blood pressure 112/67, pulse 70, temperature 98.1 F (36.7 C), temperature source Oral, resp. rate 16, height 5\' 3"  (1.6 m), weight 60.8 kg, SpO2 100 %. Body mass index is 23.74 kg/m.   Treatment Plan Summary: Daily contact with patient to assess and evaluate symptoms and progress in treatment and Medication management  Assessment #Delusional d/o #UTI #Afib #hyperthyroidism #sciatica #GERD    Delusional d/o -Decrease Risperdal to 1 mg AM and 1 mg QHS, for psychosis -Continue Cogentin 1 mg BID for EPS -Continue home Depakote 500 mg for mood stabilization             -Depakote level of 0 on admission  -Repeat Depakote level of 30  -Given that the patient does not have significant mood lability or persistent irritability will continue at current dose Antipsychotic labs  EKG: sinus brady, some inverted T-waves, discussed with ED attending, Qtc of 427              Lipids: chol of 253, LDL 177 (collected at 2 AM)             A1C: 5.6 AIMS: 0 on 11/10, cogwheeling of R wrist noted, resolved on 11/12, re-emerged on 11/14, still present on 11/16 despite increase of Cogentin to 1 mg bid.   Medical Management Covid negative CMP: unremarkable CBC: unremarkable EtOH: <10 UDS: negative Upreg: NA TSH: 1.2 UA: leukocytes, negative nitrites   Abnormal UA Patient denies symptoms and patient has refused antibiotic saying that she does not have the symptoms of a UTI.  -Repeat UA on 11/10 shows trace leukocytes and 5 ketones otherwise WNL  HTN -Home lisinopril 10 mg  Hyperthyroidism A. fib Verified in endocrinology notes. Patient last filled her 37 d supply of methimazole on 7/6 and ran out one month ago. -Continue methimazole -Continue atenolol   Sciatica Offered patient gabapentin or Lyrica, however she says that she tried in the past and they have been ineffective. Discussed with patient trial with lidocaine patch to see if there is any relief. -Lidocaine patch  GERD -Protonix  France Ravens, MD Psychiatry Resident, PGY-1 [facility] 10/20/2021, 11:36 AM    Total Time Spent in Direct Patient Care:  I personally spent 30 minutes on the unit in direct patient care. The direct patient care time included face-to-face time with the patient, reviewing the patient's chart, communicating with other professionals, and coordinating care. Greater than 50% of this time was spent in counseling or coordinating care with the patient regarding goals of hospitalization, psycho-education, and discharge planning needs.  I have independently evaluated the patient during a face-to-face assessment on 10/20/21. I reviewed the patient's chart, and I participated in key portions of the service. I discussed the case with the [employer], and I agree with the assessment and plan of care as documented in the House Officer's note, as addended by me or notated  below:  Paranoid delusions are much less since admission. However EPS persists with dose of risperdal that was effective to treat the paranoid delusions.  I talked with the patient about options: 1.reducing Risperdal, but this might make delusions worse 2.Increasing cogentin for EPS, but this could cause anticholinergic s/e and worsen cognition, and pt already has mild cognitive impairment 3.switching from Risperdal to olanzapine 5 mg once daily, as this medication has lower risk of EPS, while it remains effective for treating the delusions. Also, pt has taken olanzapine in the past, but pt cannot recall response, or lack of response, to this medication. We will titrate this medication, to effect.   Patient is agreeable to option #3, and we will stop risperdal now, and start olanzapine 5 mg qhs.  We will continue cogentin for 3 doses, while risperdal is metabolized and eliminated.   Also, pt states that she did find debit card in locker, and needs to call to activate it.    Janine Limbo, MD Psychiatrist

## 2021-10-20 NOTE — Progress Notes (Signed)
Pt presents with depressed mood, affect congruent. Shari [name] stated to writer this am that she is continuing to get better, and does smile and brighten  upon approach. She states her stomach is better and moving bowels more regularly. She denies any SI/HI or A/V Hallucinations but is noted to be more withdrawn on the unit. She has been given self inventory but has not yet completed. She did also report con't pain with chronic left leg pain and prn medications given with good relief. She denies any acute concerns. Pt is safe. Will con't to monitor.

## 2021-10-20 NOTE — [facility] Group Notes (Signed)
[facility] Group Notes:  (Nursing/MHT/Case Management/Adjunct)  Date:  10/20/2021  Time:  9:12 AM  Type of Therapy:   Goals  Participation Level:  Minimal  Participation Quality:  Appropriate  Affect:  Appropriate  Cognitive:  Appropriate  Insight:  Appropriate  Engagement in Group:  None  Modes of Intervention:  Orientation  Summary of Progress/Problems: Goal is to get her mind right to go home  Shari [name] 10/20/2021, 9:12 AM

## 2021-10-20 NOTE — Progress Notes (Signed)
   10/20/21 2200  Psych Admission Type (Psych Patients Only)  Admission Status Involuntary  Psychosocial Assessment  Patient Complaints None  Eye Contact Fair  Facial Expression Animated  Affect Appropriate to circumstance  Speech Logical/coherent  Interaction Minimal  Motor Activity Slow  Appearance/Hygiene Unremarkable  Behavior Characteristics Cooperative  Mood Depressed  Thought Process  Coherency Concrete thinking  Content WDL  Delusions None reported or observed  Perception WDL  Hallucination None reported or observed  Judgment Poor  Confusion None  Danger to Self  Current suicidal ideation? Denies  Danger to Others  Danger to Others None reported or observed

## 2021-10-20 NOTE — Progress Notes (Signed)
CSW met with patient and provided Medicare IM information. CSW provided patient education about her rights to appeal her discharge if she feels appropriate. Patient demonstrated understanding.    Bernardette Waldron, LCSW, [place] Social Worker  [facility]

## 2021-10-20 NOTE — [facility] Group Notes (Signed)
PsychoEducational Group Note- Gratitude and the impact positive affirmations can have and impact one's mental health were discussed in group. Patients were educated on daily affirmations and the way gratitude can shift thinking. Patients were asked to read insights on gratitude and share one thing they are grateful for. Patient participated and was appropriate in group.

## 2021-10-20 NOTE — Group Note (Signed)
LCSW Group Therapy Note  10/20/2021    1:00 - 2:00 PM               Type of Therapy and Topic:  Group Therapy: Understanding the differences between fear and anxiety / Fear Ladder & Examining the Evidence  Participation Level:  Active   Description of Group:   In this group session, patients learned how to define and recognize the similarities differences between fear and anxiety. Patients will explore reactions we have to fear and anxiety. Patients identified a fear or something they feel anxious about. Patients analyzed scenarios and discussed both the positive and negative aspects to them. Patients were asked to identify if it is a fear or anxiety as well.  Patients were asked to provide their own examples. Patients will learn what to do for both fear and anxiety. CSW provided tools such as breaking things up into smaller steps, challenging automatic negative thinking / questions to ask, fear ladder and how to use gradual exposure. Patients will be asked to practice each tool with situations and to complete a fear ladder for their personal gain.   Therapeutic Goals: Patients will learn the difference between fear versus anxiety and the definitions of both. Patients will utilize scenarios and be able to identify if this is an example of a fear or anxiety. Patients will learn tools to handle both their fears and anxieties as well as discuss breaking it into smaller steps and exposure.  Patients will be asked to provide examples of their own and discuss how things have both positive and negative aspects.  Patients were provided questions to ask to challenge automatic negative thinking for anxiety.  Patients were provided a sample form of a fear ladder and asked to complete one for a fear.   Summary of Patient Progress:  Patient attended group but only participated in introductions.   Therapeutic Modalities:   Cognitive Behavioral Therapy Motivational Interviewing  Brief Therapy  Beatris Si, LCSW  10/20/2021 2:21 PM

## 2021-10-21 MED ORDER — BENZTROPINE MESYLATE 0.5 MG PO TABS
0.5000 mg | ORAL_TABLET | Freq: Two times a day (BID) | ORAL | Status: DC
  Administered 2021-10-22: 0.5 mg via ORAL
  Filled 2021-10-21 (×2): qty 1

## 2021-10-21 MED ORDER — BENZTROPINE MESYLATE 1 MG PO TABS
1.0000 mg | ORAL_TABLET | Freq: Two times a day (BID) | ORAL | Status: AC
  Administered 2021-10-21: 18:00:00 1 mg via ORAL
  Filled 2021-10-21: qty 1

## 2021-10-21 MED ORDER — OLANZAPINE 7.5 MG PO TABS
7.5000 mg | ORAL_TABLET | Freq: Every day | ORAL | Status: DC
  Administered 2021-10-21: 22:00:00 7.5 mg via ORAL
  Filled 2021-10-21 (×2): qty 1

## 2021-10-21 NOTE — Progress Notes (Signed)
   10/21/21 2300  Psych Admission Type (Psych Patients Only)  Admission Status Involuntary  Psychosocial Assessment  Patient Complaints None  Eye Contact Fair  Facial Expression Animated  Affect Appropriate to circumstance  Speech Logical/coherent  Interaction Minimal  Motor Activity Slow  Appearance/Hygiene Unremarkable  Behavior Characteristics Cooperative  Mood Pleasant  Aggressive Behavior  Effect No apparent injury  Thought Process  Coherency Concrete thinking  Content WDL  Delusions None reported or observed  Perception WDL  Hallucination None reported or observed  Judgment Limited  Confusion None  Danger to Self  Current suicidal ideation? Denies  Danger to Others  Danger to Others None reported or observed

## 2021-10-21 NOTE — Group Note (Signed)
Date:  10/21/2021 Time:  10:18 AM  Group Topic/Focus:  Orientation:   The focus of this group is to educate the patient on the purpose and policies of crisis stabilization and provide a format to answer questions about their admission.  The group details unit policies and expectations of patients while admitted.    Participation Level:  Did Not Attend  Participation Quality:    Affect:    Cognitive:    Insight:   Engagement in Group:    Modes of Intervention:    Additional Comments:    Reymundo Poll 10/21/2021, 10:18 AM

## 2021-10-21 NOTE — Progress Notes (Addendum)
[facility] MD Progress Note  10/21/2021 7:31 AM Shari [name]  MRN:  [id number] Subjective:   The patient is a 65 year old female with a past psychiatric history of unspecified psychotic disorder presenting under IVC for paranoia and delusions. She believes that there are people in the woods near her house that have tried to shoot her.   The patient's chart was reviewed and nursing notes were reviewed. Over the past 24 hrs, there were no documented behavioral issues, no PRN medications given for agitation.The patient's case was discussed in multidisciplinary team meeting.  The patient was noted to have attended some groups.   The psychiatry team made the following medication changes yesterday: -Switch from Risperdal to Zyprexa 5 mg nightly  Today's interview Patient was seen and assessed at bedside this morning.  Patient reports doing well today.  She reports that her mood is "good" and denies feeling down depressed or sad.  She denies anhedonia. Patient did report that she did not sleep as well last night, but she is unsure why.  We discussed the could be due to the medication changes we made, and we will continue to monitor her sleep. She continues to have some right-sided cogwheeling, appreciated at the right wrist, when the patient is distracted. Discussed with patient this is likely due to Risperdal being still in her system.   Her gait is improved, and no shuffling is appreciated. Patient denies any paranoid thoughts or delusions.  Denies AH.  Denies VH.  Discussed plan with patient to increase Zyprexa to 7.5 mg and continue Cogentin while Risperdal is being metabolized by patient's body.  Patient verbalized understanding and agreement.  All questions were answered.   Dr. Sherron Flemings helped patient with activating her debit card, with the patient's permission.   Principal Problem: Schizophrenia (HCC) Diagnosis: Principal Problem:   Schizophrenia (HCC) Active Problems:   Mild cognitive  impairment    Past Psychiatric History: schizophrenia   Past Medical History:  Past Medical History:  Diagnosis Date   Eye globe prosthesis    GERD (gastroesophageal reflux disease)    History of blood transfusion 1973   "related to abscess burst in my stomach"   Hyperlipemia    Hyperlipidemia    Hypertension    Osteoarthritis of back    Lowerback    SVD (spontaneous vaginal delivery)    x 1, baby died at 2 wks of age   Type II diabetes mellitus (HCC)     Past Surgical History:  Procedure Laterality Date   APPENDECTOMY     COLONOSCOPY     DILATATION & CURETTAGE/HYSTEROSCOPY WITH MYOSURE N/A 02/25/2015   Procedure: DILATATION & CURETTAGE/HYSTEROSCOPY WITH MYOSURE;  Surgeon: [name], MD;  Location: [facility];  Service: Gynecology;  Laterality: N/A;   DILATION AND CURETTAGE OF UTERUS     ENUCLEATION Right 03/16/2017   ENUCLEATION Right 03/16/2017   Procedure: ENUCLEATION RIGHT EYE;  Surgeon: [name], MD;  Location: [facility];  Service: Ophthalmology;  Laterality: Right;   EYE SURGERY     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6, 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   EYE SURGERY Right ~ 1974   "S/P initial eye injury; scissors stuck in my eye""   LAPAROSCOPIC CHOLECYSTECTOMY     SHOULDER ARTHROSCOPY WITH ROTATOR CUFF REPAIR Left    wire stitches per patient   SHOULDER ARTHROSCOPY WITH ROTATOR CUFF REPAIR Right    Family History:  Family History  Problem Relation Age of Onset   Diabetes Mother  CAD Mother    Lung cancer Father    Family Psychiatric  History: unknown Social History:  Social History   Substance and Sexual Activity  Alcohol Use Yes   Comment: 03/16/2017 "nothing since 2013"     Social History   Substance and Sexual Activity  Drug Use No    Social History   Socioeconomic History   Marital status: Widowed    Spouse name: Not on file   Number of children: Not on file   Years of education: Not on file   Highest education level: Not on file  Occupational History   Not  on file  Tobacco Use   Smoking status: Former    Packs/day: 0.50    Years: 40.00    Pack years: 20.00    Types: Cigarettes    Quit date: 2016    Years since quitting: 6.8   Smokeless tobacco: Never  Vaping Use   Vaping Use: Never used  Substance and Sexual Activity   Alcohol use: Yes    Comment: 03/16/2017 "nothing since 2013"   Drug use: No   Sexual activity: Not Currently    Birth control/protection: Post-menopausal  Other Topics Concern   Not on file  Social History Narrative   Not on file   Social Determinants of Health   Financial Resource Strain: Not on file  Food Insecurity: Not on file  Transportation Needs: Not on file  Physical Activity: Not on file  Stress: Not on file  Social Connections: Not on file   Additional Social History:  Specify valuables returned: see belongings sheet     Sleep: Fair  Appetite:  Fair  Current Medications: Current Facility-Administered Medications  Medication Dose Route Frequency Provider Last Rate Last Admin   acetaminophen (TYLENOL) tablet 650 mg  650 mg Oral Q6H PRN Oneta Rack, NP   650 mg at 10/18/21 0835   alum & mag hydroxide-simeth (MAALOX/MYLANTA) 200-200-20 MG/5ML suspension 30 mL  30 mL Oral Q4H PRN Oneta Rack, NP       atenolol (TENORMIN) tablet 12.5 mg  12.5 mg Oral Daily Carlyn Reichert, MD   12.5 mg at 10/20/21 6144   atorvastatin (LIPITOR) tablet 40 mg  40 mg Oral Daily Oneta Rack, NP   40 mg at 10/20/21 3154   benztropine (COGENTIN) tablet 1 mg  1 mg Oral BID Yonas Bunda, Harrold Donath, MD       divalproex (DEPAKOTE) DR tablet 500 mg  500 mg Oral QHS Oneta Rack, NP   500 mg at 10/20/21 2125   feeding supplement (BOOST / RESOURCE BREEZE) liquid 1 Container  1 Container Oral BID BM Phineas Inches, MD   1 Container at 10/20/21 0920   hydrOXYzine (ATARAX/VISTARIL) tablet 25 mg  25 mg Oral TID PRN Oneta Rack, NP   25 mg at 10/17/21 2134   ibuprofen (ADVIL) tablet 600 mg  600 mg Oral TID PRN  Bobbitt, Shalon E, NP   600 mg at 10/20/21 0824   lisinopril (ZESTRIL) tablet 10 mg  10 mg Oral Daily Carlyn Reichert, MD   10 mg at 10/20/21 0086   magnesium hydroxide (MILK OF MAGNESIA) suspension 30 mL  30 mL Oral Daily PRN Oneta Rack, NP   30 mL at 10/16/21 1533   methimazole (TAPAZOLE) tablet 10 mg  10 mg Oral Daily Carlyn Reichert, MD   10 mg at 10/20/21 0823   OLANZapine (ZYPREXA) tablet 5 mg  5 mg Oral QHS Phineas Inches, MD  5 mg at 10/20/21 2125   pantoprazole (PROTONIX) EC tablet 40 mg  40 mg Oral BID AC Princess Bruins, DO   40 mg at 10/21/21 7078   traZODone (DESYREL) tablet 50 mg  50 mg Oral QHS PRN Oneta Rack, NP   50 mg at 10/20/21 2125    Lab Results:  No results found for this or any previous visit (from the past 48 hour(s)).    Blood Alcohol level:  Lab Results  Component Value Date   ETH <10 10/07/2021   ETH <10 08/10/2021    Metabolic Disorder Labs: Lab Results  Component Value Date   HGBA1C 5.6 10/07/2021   MPG 114.02 10/07/2021   No results found for: PROLACTIN Lab Results  Component Value Date   CHOL 253 (H) 10/07/2021   TRIG 99 10/07/2021   HDL 56 10/07/2021   CHOLHDL 4.5 10/07/2021   VLDL 20 10/07/2021   LDLCALC 177 (H) 10/07/2021    Physical Findings: AIMS: 0 on 11/10  Musculoskeletal: Strength & Muscle Tone: within normal limits Gait & Station:  not assessed Patient leans: NA  Psychiatric Specialty Exam:  Presentation  General Appearance: appropriate for environment, new clothes Eye Contact:Fair  Speech:normal rate and fluency  Speech Volume:Normal  Handedness:Right  Mood and Affect  Mood: "good" Affect: euthymic  Thought Process  Thought Processes:Goal Directed; Coherent  Descriptions of Associations: intact Orientation: oriented to person place, time and event  Thought Content: denies suicidal thoughts and auditory/visual hallucinations and first rank symptoms  History of Schizophrenia/Schizoaffective  disorder:Yes  Duration of Psychotic Symptoms:Greater than six months  Hallucinations: denies  Ideas of Reference: none  Suicidal Thoughts: denies  Homicidal Thoughts: Denies   Sensorium  Memory:Immediate Fair; Recent Fair; Remote Fair  Judgment:Fair  Insight:Lacking   Executive Functions  Concentration:Fair  Attention Span:Fair  Recall:Fair  Fund of Knowledge:Fair  Language:Fair   Psychomotor Activity  Psychomotor Activity: Cogwheeling appreciated at right wrist, more pronounced with distraction   Assets  Assets:Communication Skills; Desire for Improvement; Resilience   Sleep  Sleep: fair Patient slept Number of Hours: 6.5   Physical Exam Vitals reviewed.  Constitutional:      Appearance: [name] not ill-appearing or toxic-appearing.  HENT:     Head: Normocephalic.  Pulmonary:     Effort: Pulmonary effort is normal.  Musculoskeletal:     Comments: Cogwheeling and rigidity noted again on RUE  Neurological:     General: No focal deficit present.     Mental Status: [name] alert.   Review of Systems  Respiratory:  Negative for shortness of breath.   Cardiovascular:  Negative for chest pain.  Gastrointestinal:  Negative for nausea and vomiting.   Blood pressure 120/69, pulse 66, temperature 97.9 F (36.6 C), temperature source Oral, resp. rate 16, height 5\' 3"  (1.6 m), weight 60.8 kg, SpO2 100 %. Body mass index is 23.74 kg/m.   Treatment Plan Summary: Daily contact with patient to assess and evaluate symptoms and progress in treatment and Medication management  Assessment #Schizophrenia #UTI #Afib #hyperthyroidism #sciatica #GERD    Schizophrenia: -Increase Zyprexa from 5 mg to 7.5 mg nightly -Decrease Cogentin from 1 mg twice daily -> to 0.5 mg twice daily, starting on 10/22/2021, for 2 doses -Continue home Depakote 500 mg for mood stabilization             -Depakote level of 0 on admission  -Repeat Depakote level of 30  -Given that  the patient does not have significant mood lability  or persistent irritability will continue at current dose Antipsychotic labs             EKG: sinus brady, some inverted T-waves, discussed with ED attending, Qtc of 427             Lipids: chol of 253, LDL 177 (collected at 2 AM)             A1C: 5.6 AIMS: 0 on 11/10 Motor: Cogwheeling of R wrist noted, resolved on 11/12, re-emerged on 11/14, still present on 11/17 despite discontinuation of Risperdal and keeping Cogentin on.  Medical Management Covid negative CMP: unremarkable CBC: unremarkable EtOH: <10 UDS: negative Upreg: NA TSH: 1.2 UA: leukocytes, negative nitrites   Abnormal UA Patient denies symptoms and patient has refused antibiotic saying that she does not have the symptoms of a UTI.  -Repeat UA on 11/10 shows trace leukocytes and 5 ketones otherwise WNL  HTN -Home lisinopril 10 mg  Hyperthyroidism A. fib Verified in endocrinology notes. Patient last filled her 33 d supply of methimazole on 7/6 and ran out one month ago. -Continue methimazole -Continue atenolol   Sciatica Offered patient gabapentin or Lyrica, however she says that she tried in the past and they have been ineffective. Discussed with patient trial with lidocaine patch to see if there is any relief. -Lidocaine patch  GERD -Protonix  Park Pope, MD Psychiatry Resident, PGY-1 [facility] 10/21/2021, 7:31 AM    Total Time Spent in Direct Patient Care:  I personally spent 30 minutes on the unit in direct patient care. The direct patient care time included face-to-face time with the patient, reviewing the patient's chart, communicating with other professionals, and coordinating care. Greater than 50% of this time was spent in counseling or coordinating care with the patient regarding goals of hospitalization, psycho-education, and discharge planning needs.  I have independently evaluated the patient during a face-to-face assessment on 10/21/21. I  reviewed the patient's chart, and I participated in key portions of the service. I discussed the case with the [employer], and I agree with the assessment and plan of care as documented in the House Officer's note, as addended by me or notated below:  I directly edited the note, as above.   Phineas Inches, MD Psychiatrist

## 2021-10-21 NOTE — Group Note (Signed)
Date:  10/21/2021 Time:  4:01 PM  Group Topic/Focus:  Dimensions of Wellness:   The focus of this group is to introduce the topic of wellness and discuss the role each dimension of wellness plays in total health.    Participation Level:  Did Not Attend  Participation Quality:    Affect:    Cognitive:    Insight:   Engagement in Group:    Modes of Intervention:    Additional Comments:    Reymundo Poll 10/21/2021, 4:01 PM

## 2021-10-21 NOTE — Group Note (Signed)
Date:  10/21/2021 Time:  5:06 PM  Group Topic/Focus:  Self Care:   The focus of this group is to help patients understand the importance of self-care in order to improve or restore emotional, physical, spiritual, interpersonal, and financial health.    Participation Level:  Did Not Attend  Participation Quality:    Affect:    Cognitive:    Insight:   Engagement in Group:    Modes of Intervention:    Additional Comments:    Reymundo Poll 10/21/2021, 5:06 PM

## 2021-10-21 NOTE — Progress Notes (Addendum)
CSW provided patient with list of hotel options for when she is discharged.  CSW encouraged patient to call for specific rates and to book a hotel.  Patient discussed barriers because she had her debit card activated but does not have access to account information.  CSW encouraged Shawniece to get on the phones to and figure out account information and secure discharge plan. Patient said that if she had any issues she would notify the doctor. Hotel information provided below:   [facility] [place] [address] [place],  [place], [place] [address]  [phone]  [facility] [address]  [place], [place] [address] [phone]  [facility] [place] [address]  [place], [place] [address] [phone]   Anson Oregon, LCSW, LCAS Clincal Social Worker  [facility]

## 2021-10-21 NOTE — Progress Notes (Signed)
Pt A & O X3 on interactions. Denies SI, HI and AVH  when assessed. Received PRN Motrin 600 mg at 0811 for c/o left leg pain 7/10, chronic to this pt. Reported relief when reassessed at 0910 "It's much better". Verbalized concerns about clothing needs related to discharge. Pt tolerates all PO intake well. Denies discomfort with current medication regimen. Debit card returned to pt's handbag in assigned locker. Pt remains isolative to room majority of this shift, however, she's pleasant and minimal on interactions.   Support and reassurance offered to pt. Q 15 minutes safety maintained without self harm gestures. All medications administered with verbal education and effects monitored. Pt encouraged to voice concerns.  Pt safe on and off unit. Expressed excitement about discharge "I'm just ready to go now and start taking care of myself".

## 2021-10-21 NOTE — Progress Notes (Signed)
Psychoeducational Group Note  Date:  10/21/2021 Time:  2103  Group Topic/Focus:  Wrap-Up Group:   The focus of this group is to help patients review their daily goal of treatment and discuss progress on daily workbooks.  Participation Level: Did Not Attend  Participation Quality:  Not Applicable  Affect:  Not Applicable  Cognitive:  Not Applicable  Insight:  Not Applicable  Engagement in Group: Not Applicable  Additional Comments:  The patient did not attend group this evening.   Hazle Coca S 10/21/2021, 9:03 PM

## 2021-10-22 ENCOUNTER — Encounter (HOSPITAL_COMMUNITY): Payer: Self-pay

## 2021-10-22 MED ORDER — MENTHOL 3 MG MT LOZG
1.0000 | LOZENGE | OROMUCOSAL | Status: DC | PRN
  Administered 2021-10-22: 3 mg via ORAL
  Filled 2021-10-22: qty 9

## 2021-10-22 MED ORDER — BENZTROPINE MESYLATE 0.5 MG PO TABS
0.5000 mg | ORAL_TABLET | Freq: Two times a day (BID) | ORAL | Status: DC
  Administered 2021-10-22 – 2021-10-23 (×2): 0.5 mg via ORAL
  Filled 2021-10-22 (×5): qty 1

## 2021-10-22 MED ORDER — OLANZAPINE 10 MG PO TABS
10.0000 mg | ORAL_TABLET | Freq: Every day | ORAL | Status: DC
  Administered 2021-10-22 – 2021-10-25 (×4): 10 mg via ORAL
  Filled 2021-10-22: qty 1
  Filled 2021-10-22: qty 7
  Filled 2021-10-22 (×4): qty 1

## 2021-10-22 MED ORDER — DOCUSATE SODIUM 100 MG PO CAPS
100.0000 mg | ORAL_CAPSULE | Freq: Every day | ORAL | Status: DC
  Administered 2021-10-22 – 2021-10-26 (×5): 100 mg via ORAL
  Filled 2021-10-22 (×8): qty 1
  Filled 2021-10-22: qty 7

## 2021-10-22 MED ORDER — TRAZODONE HCL 50 MG PO TABS
50.0000 mg | ORAL_TABLET | Freq: Every day | ORAL | Status: DC
  Administered 2021-10-22 – 2021-10-25 (×4): 50 mg via ORAL
  Filled 2021-10-22 (×4): qty 1
  Filled 2021-10-22: qty 7
  Filled 2021-10-22: qty 1

## 2021-10-22 MED ORDER — WHITE PETROLATUM EX OINT
TOPICAL_OINTMENT | CUTANEOUS | Status: AC
  Administered 2021-10-22: 1
  Filled 2021-10-22: qty 5

## 2021-10-22 NOTE — Progress Notes (Signed)
Pt OOB room at brief intervals for medications and fluids. Denies SI, HI, AVH and pain when assessed. Confirmed poor sleep last night due to racing / ruminating thoughts "I was thinking of my sister and why she put me in here. I can't go home to her and that makes me sad too". Tolerates meals and medications well without discomfort. Received PRN Cepacol lozenge for complain of sore / itchy throat. Remains concrete but is pleasant and brightens up on interactions with avertive eye contact. Emotional support and reassurance offered to pt. Safety checks maintained at Q 15 minutes intervals. All medications administered with verbal education and effects monitored. Writer encouraged pt to voice concerns and not to take long nap this shift; to enable a healthy sleep schedule tonight.    Remains safe on unit. Denies concerns at this time.

## 2021-10-22 NOTE — Group Note (Signed)
LCSW Group Therapy Note   Group Date: 10/22/2021 Start Time: 1300 End Time: 1400  Type of Therapy:  Group Therapy   Topic of Therapy:  Gratitude   Participation Level:  Active   Due to the COVID-19 pandemic, this group has been supplemented with worksheets.   Summary of Progress/Problems: CSW provided worksheet to patient due to COVID. CSW offered to meet individually with patient as needed  Shari [name], Theresia Majors 10/22/2021  1:13 PM

## 2021-10-22 NOTE — BH IP Treatment Plan (Signed)
Interdisciplinary Treatment and Diagnostic Plan Update  10/22/2021 Time of Session:  Shari [name] MRN: [id number]  Principal Diagnosis: Schizophrenia [facility])  Secondary Diagnoses: Principal Problem:   Schizophrenia (HCC) Active Problems:   Mild cognitive impairment   Current Medications:  Current Facility-Administered Medications  Medication Dose Route Frequency Provider Last Rate Last Admin   acetaminophen (TYLENOL) tablet 650 mg  650 mg Oral Q6H PRN Oneta Rack, NP   650 mg at 10/18/21 0835   alum & mag hydroxide-simeth (MAALOX/MYLANTA) 200-200-20 MG/5ML suspension 30 mL  30 mL Oral Q4H PRN Oneta Rack, NP       atenolol (TENORMIN) tablet 12.5 mg  12.5 mg Oral Daily Carlyn Reichert, MD   12.5 mg at 10/22/21 2423   atorvastatin (LIPITOR) tablet 40 mg  40 mg Oral Daily Oneta Rack, NP   40 mg at 10/22/21 0813   benztropine (COGENTIN) tablet 0.5 mg  0.5 mg Oral BID Massengill, Harrold Donath, MD   0.5 mg at 10/22/21 5361   divalproex (DEPAKOTE) DR tablet 500 mg  500 mg Oral QHS Oneta Rack, NP   500 mg at 10/21/21 2138   feeding supplement (BOOST / RESOURCE BREEZE) liquid 1 Container  1 Container Oral BID BM Massengill, Harrold Donath, MD   1 Container at 10/21/21 1421   hydrOXYzine (ATARAX/VISTARIL) tablet 25 mg  25 mg Oral TID PRN Oneta Rack, NP   25 mg at 10/17/21 2134   ibuprofen (ADVIL) tablet 600 mg  600 mg Oral TID PRN Bobbitt, Shalon E, NP   600 mg at 10/21/21 2140   lisinopril (ZESTRIL) tablet 10 mg  10 mg Oral Daily Carlyn Reichert, MD   10 mg at 10/22/21 0813   magnesium hydroxide (MILK OF MAGNESIA) suspension 30 mL  30 mL Oral Daily PRN Oneta Rack, NP   30 mL at 10/16/21 1533   methimazole (TAPAZOLE) tablet 10 mg  10 mg Oral Daily Carlyn Reichert, MD   10 mg at 10/22/21 0814   OLANZapine (ZYPREXA) tablet 10 mg  10 mg Oral QHS Massengill, Harrold Donath, MD       pantoprazole (PROTONIX) EC tablet 40 mg  40 mg Oral BID AC Princess Bruins, DO   40 mg at 10/22/21 0815   traZODone  (DESYREL) tablet 50 mg  50 mg Oral QHS PRN Oneta Rack, NP   50 mg at 10/21/21 2138   traZODone (DESYREL) tablet 50 mg  50 mg Oral QHS Massengill, Harrold Donath, MD       PTA Medications: Medications Prior to Admission  Medication Sig Dispense Refill Last Dose   atenolol (TENORMIN) 25 MG tablet Take 0.5 tablets (12.5 mg total) by mouth daily.      atorvastatin (LIPITOR) 40 MG tablet Take 40 mg by mouth daily.      ciprofloxacin (CIPRO) 500 MG tablet Take 1 tablet (500 mg total) by mouth 2 (two) times daily.      divalproex (DEPAKOTE ER) 250 MG 24 hr tablet Take 750 mg by mouth at bedtime.      ibuprofen (ADVIL) 800 MG tablet Take 800 mg by mouth 3 (three) times daily.      lisinopril (PRINIVIL,ZESTRIL) 10 MG tablet Take 10 mg by mouth daily.       methimazole (TAPAZOLE) 10 MG tablet Take 10 mg by mouth daily.      pantoprazole (PROTONIX) 40 MG tablet Take 40 mg by mouth 2 (two) times daily.       risperiDONE (RISPERDAL) 1 MG  tablet Take 1 tablet (1 mg total) by mouth at bedtime.      traZODone (DESYREL) 50 MG tablet Take 1 tablet (50 mg total) by mouth at bedtime as needed for sleep.       Patient Stressors: Marital or family conflict   Medication change or noncompliance    Patient Strengths: Physical Health  Religious Affiliation  Supportive family/friends   Treatment Modalities: Medication Management, Group therapy, Case management,  1 to 1 session with clinician, Psychoeducation, Recreational therapy.   Physician Treatment Plan for Primary Diagnosis: Schizophrenia (HCC) Long Term Goal(s): Improvement in symptoms so as ready for discharge   Short Term Goals: Ability to maintain clinical measurements within normal limits will improve Compliance with prescribed medications will improve  Medication Management: Evaluate patient's response, side effects, and tolerance of medication regimen.  Therapeutic Interventions: 1 to 1 sessions, Unit Group sessions and Medication  administration.  Evaluation of Outcomes: Progressing  Physician Treatment Plan for Secondary Diagnosis: Principal Problem:   Schizophrenia (HCC) Active Problems:   Mild cognitive impairment  Long Term Goal(s): Improvement in symptoms so as ready for discharge   Short Term Goals: Ability to maintain clinical measurements within normal limits will improve Compliance with prescribed medications will improve     Medication Management: Evaluate patient's response, side effects, and tolerance of medication regimen.  Therapeutic Interventions: 1 to 1 sessions, Unit Group sessions and Medication administration.  Evaluation of Outcomes: Progressing   RN Treatment Plan for Primary Diagnosis: Schizophrenia (HCC) Long Term Goal(s): Knowledge of disease and therapeutic regimen to maintain health will improve  Short Term Goals: Ability to demonstrate self-control, Ability to participate in decision making will improve, and Ability to verbalize feelings will improve  Medication Management: RN will administer medications as ordered by provider, will assess and evaluate patient's response and provide education to patient for prescribed medication. RN will report any adverse and/or side effects to prescribing provider.  Therapeutic Interventions: 1 on 1 counseling sessions, Psychoeducation, Medication administration, Evaluate responses to treatment, Monitor vital signs and CBGs as ordered, Perform/monitor CIWA, COWS, AIMS and Fall Risk screenings as ordered, Perform wound care treatments as ordered.  Evaluation of Outcomes: Progressing   LCSW Treatment Plan for Primary Diagnosis: Schizophrenia (HCC) Long Term Goal(s): Safe transition to appropriate next level of care at discharge, Engage patient in therapeutic group addressing interpersonal concerns.  Short Term Goals: Engage patient in aftercare planning with referrals and resources, Increase social support, and Increase ability to appropriately  verbalize feelings  Therapeutic Interventions: Assess for all discharge needs, 1 to 1 time with Social worker, Explore available resources and support systems, Assess for adequacy in community support network, Educate family and significant other(s) on suicide prevention, Complete Psychosocial Assessment, Interpersonal group therapy.  Evaluation of Outcomes: Progressing   Progress in Treatment: Attending groups: Yes. Participating in groups: Yes. Taking medication as prescribed: Yes. Toleration medication: Yes. Family/Significant other contact made: Yes, individual(s) contacted:  pt declined consents Patient understands diagnosis: Yes. Discussing patient identified problems/goals with staff: Yes. Medical problems stabilized or resolved: Yes. Denies suicidal/homicidal ideation: Yes. Issues/concerns per patient self-inventory: Yes. Other: None  New problem(s) identified: No, Describe:  None  New Short Term/Long Term Goal(s):medication stabilization, elimination of SI thoughts, development of comprehensive mental wellness plan.   Patient Goals:  Did Not Attend  Discharge Plan or Barriers: Pt had recent medication changes that have caused her psychotic symptoms to be more present.  Reason for Continuation of Hospitalization: Anxiety Hallucinations Medication stabilization  Estimated Length  of Stay: 3-5 days   Scribe for Treatment Team: Chrys Racer 10/22/2021 11:23 AM

## 2021-10-22 NOTE — [facility] Group Notes (Signed)
Patient did not attend morning orientation group.  

## 2021-10-22 NOTE — Progress Notes (Addendum)
[facility] MD Progress Note  10/22/2021 12:00 PM Shari [name]  MRN:  [id number] Subjective:   The patient is a 65 year old female with a past psychiatric history of unspecified psychotic disorder presenting under IVC for paranoia and delusions. She believes that there are people in the woods near her house that have tried to shoot her.   The patient's chart was reviewed and nursing notes were reviewed. Over the past 24 hrs, there were no documented behavioral issues, no PRN medications given for agitation.The patient's case was discussed in multidisciplinary team meeting.  The patient was noted to have attended some groups.   The psychiatry team made the following medication changes yesterday: -Increase Zyprexa to 7.5 mg nightly -Taper Cogentin  Today's interview Patient was seen and assessed at bedside this morning.  She reports that her mood is "fine" and denies feeling down depressed or sad.  Patient reports that she did not sleep well last night.  Patient states that she has been having perseverative thoughts with regards to her sister having created with patient's admission at the hospital.  Patient reports that this may have affected her sleep but she was also "on and off again with her sleep".  There is concern, the patient's paranoid delusions, are ramping up, as Risperdal was stopped and Zyprexa was started and is being increased, due to EPS. She otherwise denies auditory hallucinations and visual hallucinations. Cogwheeling rigidity is less prominent, at the right wrist.  This is an improvement.  There are no other tremors or rigidity.  Gait is within normal limits.  Denies feeling restless.  Denies muscle spasms. Appetite is stable.  Concentration has not changed. She denies having suicidal thoughts.  She denies having homicidal thoughts.  Discussed plan with patient to increase Zyprexa to 10 mg and continue Cogentin while Risperdal is being metabolized by patient's body.  Patient  verbalized understanding and agreement.  All questions were answered.    Principal Problem: Schizophrenia (HCC) Diagnosis: Principal Problem:   Schizophrenia (HCC) Active Problems:   Mild cognitive impairment    Past Psychiatric History: schizophrenia   Past Medical History:  Past Medical History:  Diagnosis Date   Eye globe prosthesis    GERD (gastroesophageal reflux disease)    History of blood transfusion 1973   "related to abscess burst in my stomach"   Hyperlipemia    Hyperlipidemia    Hypertension    Osteoarthritis of back    Lowerback    SVD (spontaneous vaginal delivery)    x 1, baby died at 2 wks of age   Type II diabetes mellitus (HCC)     Past Surgical History:  Procedure Laterality Date   APPENDECTOMY     COLONOSCOPY     DILATATION & CURETTAGE/HYSTEROSCOPY WITH MYOSURE N/A 02/25/2015   Procedure: DILATATION & CURETTAGE/HYSTEROSCOPY WITH MYOSURE;  Surgeon: [name], MD;  Location: [facility];  Service: Gynecology;  Laterality: N/A;   DILATION AND CURETTAGE OF UTERUS     ENUCLEATION Right 03/16/2017   ENUCLEATION Right 03/16/2017   Procedure: ENUCLEATION RIGHT EYE;  Surgeon: [name], MD;  Location: [facility];  Service: Ophthalmology;  Laterality: Right;   EYE SURGERY     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6, 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   EYE SURGERY Right ~ 1974   "S/P initial eye injury; scissors stuck in my eye""   LAPAROSCOPIC CHOLECYSTECTOMY     SHOULDER ARTHROSCOPY WITH ROTATOR CUFF REPAIR Left    wire stitches per patient   SHOULDER  ARTHROSCOPY WITH ROTATOR CUFF REPAIR Right    Family History:  Family History  Problem Relation Age of Onset   Diabetes Mother    CAD Mother    Lung cancer Father    Family Psychiatric  History: unknown Social History:  Social History   Substance and Sexual Activity  Alcohol Use Yes   Comment: 03/16/2017 "nothing since 2013"     Social History   Substance and Sexual Activity  Drug Use No    Social History   Socioeconomic  History   Marital status: Widowed    Spouse name: Not on file   Number of children: Not on file   Years of education: Not on file   Highest education level: Not on file  Occupational History   Not on file  Tobacco Use   Smoking status: Former    Packs/day: 0.50    Years: 40.00    Pack years: 20.00    Types: Cigarettes    Quit date: 2016    Years since quitting: 6.8   Smokeless tobacco: Never  Vaping Use   Vaping Use: Never used  Substance and Sexual Activity   Alcohol use: Yes    Comment: 03/16/2017 "nothing since 2013"   Drug use: No   Sexual activity: Not Currently    Birth control/protection: Post-menopausal  Other Topics Concern   Not on file  Social History Narrative   Not on file   Social Determinants of Health   Financial Resource Strain: Not on file  Food Insecurity: Not on file  Transportation Needs: Not on file  Physical Activity: Not on file  Stress: Not on file  Social Connections: Not on file   Additional Social History:  Specify valuables returned: see belongings sheet     Sleep: Fair  Appetite:  Fair  Current Medications: Current Facility-Administered Medications  Medication Dose Route Frequency Provider Last Rate Last Admin   acetaminophen (TYLENOL) tablet 650 mg  650 mg Oral Q6H PRN Oneta Rack, NP   650 mg at 10/18/21 0835   alum & mag hydroxide-simeth (MAALOX/MYLANTA) 200-200-20 MG/5ML suspension 30 mL  30 mL Oral Q4H PRN Oneta Rack, NP       atenolol (TENORMIN) tablet 12.5 mg  12.5 mg Oral Daily Carlyn Reichert, MD   12.5 mg at 10/22/21 9311   atorvastatin (LIPITOR) tablet 40 mg  40 mg Oral Daily Oneta Rack, NP   40 mg at 10/22/21 0813   benztropine (COGENTIN) tablet 0.5 mg  0.5 mg Oral BID Aashritha Miedema, Harrold Donath, MD   0.5 mg at 10/22/21 2162   divalproex (DEPAKOTE) DR tablet 500 mg  500 mg Oral QHS Oneta Rack, NP   500 mg at 10/21/21 2138   feeding supplement (BOOST / RESOURCE BREEZE) liquid 1 Container  1 Container Oral BID  BM Natalina Wieting, Harrold Donath, MD   1 Container at 10/21/21 1421   hydrOXYzine (ATARAX/VISTARIL) tablet 25 mg  25 mg Oral TID PRN Oneta Rack, NP   25 mg at 10/17/21 2134   ibuprofen (ADVIL) tablet 600 mg  600 mg Oral TID PRN Bobbitt, Shalon E, NP   600 mg at 10/21/21 2140   lisinopril (ZESTRIL) tablet 10 mg  10 mg Oral Daily Carlyn Reichert, MD   10 mg at 10/22/21 0813   magnesium hydroxide (MILK OF MAGNESIA) suspension 30 mL  30 mL Oral Daily PRN Oneta Rack, NP   30 mL at 10/16/21 1533   methimazole (TAPAZOLE) tablet 10 mg  10 mg Oral Daily Carlyn Reichert, MD   10 mg at 10/22/21 0814   OLANZapine (ZYPREXA) tablet 10 mg  10 mg Oral QHS Kaitlin Ardito, Harrold Donath, MD       pantoprazole (PROTONIX) EC tablet 40 mg  40 mg Oral BID AC Princess Bruins, DO   40 mg at 10/22/21 0815   traZODone (DESYREL) tablet 50 mg  50 mg Oral QHS PRN Oneta Rack, NP   50 mg at 10/21/21 2138   traZODone (DESYREL) tablet 50 mg  50 mg Oral QHS Makailah Slavick, Harrold Donath, MD        Lab Results:  No results found for this or any previous visit (from the past 48 hour(s)).    Blood Alcohol level:  Lab Results  Component Value Date   ETH <10 10/07/2021   ETH <10 08/10/2021    Metabolic Disorder Labs: Lab Results  Component Value Date   HGBA1C 5.6 10/07/2021   MPG 114.02 10/07/2021   No results found for: PROLACTIN Lab Results  Component Value Date   CHOL 253 (H) 10/07/2021   TRIG 99 10/07/2021   HDL 56 10/07/2021   CHOLHDL 4.5 10/07/2021   VLDL 20 10/07/2021   LDLCALC 177 (H) 10/07/2021    Physical Findings: AIMS: 0 on 11/10  Musculoskeletal: Strength & Muscle Tone: within normal limits Gait & Station: Within normal limits Patient leans: NA  Psychiatric Specialty Exam:  Presentation  General Appearance: appropriate for environment, new clothes Eye Contact:Fair  Speech:normal rate and fluency  Speech Volume:Normal  Handedness:Right  Mood and Affect  Mood: "good" Affect: euthymic  Thought  Process  Thought Processes:Goal Directed; Coherent  Descriptions of Associations: intact Orientation: oriented to person place, time and event  Thought Content: Patient is fixated, and sister causing her to be admitted to this hospital, these could be paranoid delusions.  She otherwise denies suicidal thoughts and auditory/visual hallucinations  History of Schizophrenia/Schizoaffective disorder:Yes  Duration of Psychotic Symptoms:Greater than six months  Hallucinations: denies  Ideas of Reference: none  Suicidal Thoughts: denies  Homicidal Thoughts: Denies   Sensorium  Memory:Immediate Fair; Recent Fair; Remote Fair  Judgment:Fair  Insight:Lacking   Executive Functions  Concentration:Fair  Attention Span:Fair  Recall:Fair  Fund of Knowledge:Fair  Language:Fair   Psychomotor Activity  Psychomotor Activity: Cogwheeling appreciated at right wrist, more pronounced with distraction   Assets  Assets:Communication Skills; Desire for Improvement; Resilience   Sleep  Sleep: fair Patient slept Number of Hours: 2   Physical Exam Vitals reviewed.  Constitutional:      Appearance: [name] not ill-appearing or toxic-appearing.  HENT:     Head: Normocephalic.  Pulmonary:     Effort: Pulmonary effort is normal.  Neurological:     General: No focal deficit present.     Mental Status: [name] alert.   Review of Systems  Respiratory:  Negative for shortness of breath.   Cardiovascular:  Negative for chest pain.  Gastrointestinal:  Negative for nausea and vomiting.   Blood pressure 120/64, pulse 72, temperature 98.3 F (36.8 C), temperature source Oral, resp. rate 16, height 5\' 3"  (1.6 m), weight 60.8 kg, SpO2 100 %. Body mass index is 23.74 kg/m.   Treatment Plan Summary: Daily contact with patient to assess and evaluate symptoms and progress in treatment and Medication management  Assessment #Schizophrenia #Afib #hyperthyroidism #sciatica #GERD     Schizophrenia: -Increase Zyprexa from 5 mg to 7.5 mg nightly -Continue Cogentin 0.5 mg twice daily, 4 doses -Continue home Depakote 500 mg  for mood stabilization             -Depakote level of 0 on admission  -Repeat Depakote level of 30  -Given that the patient does not have significant mood lability or persistent irritability will continue at current dose Antipsychotic labs             EKG: sinus brady, some inverted T-waves, discussed with ED attending, Qtc of 427             Lipids: chol of 253, LDL 177 (collected at 2 AM)             A1C: 5.6 AIMS: 0 on 11/10 Motor: Cogwheeling of R wrist noted, resolved on 11/12, re-emerged on 11/14, still present on 11/17 despite discontinuation of Risperdal and keeping Cogentin on.  Medical Management Covid negative CMP: unremarkable CBC: unremarkable EtOH: <10 UDS: negative Upreg: NA TSH: 1.2 UA: leukocytes, negative nitrites    Abnormal UA Patient denies symptoms and patient has refused antibiotic saying that she does not have the symptoms of a UTI.  -Repeat UA on 11/10 shows trace leukocytes and 5 ketones otherwise WNL  HTN -Home lisinopril 10 mg  Hyperthyroidism A. fib Verified in endocrinology notes. Patient last filled her 69 d supply of methimazole on 7/6 and ran out one month ago. -Continue methimazole -Continue atenolol   Sciatica Offered patient gabapentin or Lyrica, however she says that she tried in the past and they have been ineffective. Discussed with patient trial with lidocaine patch to see if there is any relief. -Lidocaine patch  GERD -Protonix 40 mg twice daily  Park Pope, MD Psychiatry Resident, PGY-1 [facility] 10/22/2021, 12:00 PM   Total Time Spent in Direct Patient Care:  I personally spent 30 minutes on the unit in direct patient care. The direct patient care time included face-to-face time with the patient, reviewing the patient's chart, communicating with other professionals, and  coordinating care. Greater than 50% of this time was spent in counseling or coordinating care with the patient regarding goals of hospitalization, psycho-education, and discharge planning needs.  I have independently evaluated the patient during a face-to-face assessment on 10/22/21. I reviewed the patient's chart, and I participated in key portions of the service. I discussed the case with the [employer], and I agree with the assessment and plan of care as documented in the House Officer's note, as addended by me or notated below:  I directly edited the note, as above. There are subtle signs of the patient becoming more paranoid, as we are cross titrating off of Risperdal onto Zyprexa.  We had to stop Risperdal, due to EPS, that did not resolve with decreasing dose of Risperdal and starting Cogentin.  We are increasing the Zyprexa, for addressing poor sleep and symptoms of paranoia.  Continue to monitor recurrence of paranoid symptoms and EPS.  Phineas Inches, MD Psychiatrist

## 2021-10-22 NOTE — [facility] Group Notes (Addendum)
Patient provided worksheets to patient due to Covid. Tech also offered to meet individually with patient, if needed.          Note Details

## 2021-10-22 NOTE — Group Note (Signed)
Recreation Therapy Group Note   Group Topic:Stress Management  Group Date: 10/22/2021 Start Time: 0930 End Time: 0945 Facilitators: Caroll Rancher, [place] Location: [address] Dayroom   Goal Area(s) Addresses:  Patient will identify positive stress management techniques. Patient will identify benefits of using stress management post d/c.  Group Description:  Meditation.  LRT played a meditation that focused on setting personal boundaries putting, saying to others can be a way of saying yes to yourself and putting yourself first as a form of self care.  Patients were to listen and follow along as meditation played to fully engage.    Affect/Mood: N/A   Participation Level: Did not attend    Clinical Observations/Individualized Feedback: Pt did not attend group session.    Plan: Continue to engage patient in RT group sessions 2-3x/week.   Caroll Rancher, LRT,CTRS 10/22/2021 11:01 AM

## 2021-10-22 NOTE — Progress Notes (Signed)
Psychoeducational Group Note  Date:  10/22/2021 Time:  2042  Group Topic/Focus:  Wrap-Up Group:   The focus of this group is to help patients review their daily goal of treatment and discuss progress on daily workbooks.  Participation Level: Did Not Attend  Participation Quality:  Not Applicable  Affect:  Not Applicable  Cognitive:  Not Applicable  Insight:  Not Applicable  Engagement in Group: Not Applicable  Additional Comments:  The patient did not attend group this evening since she was asleep.   Hazle Coca S 10/22/2021, 8:42 PM

## 2021-10-23 MED ORDER — RISAQUAD PO CAPS
1.0000 | ORAL_CAPSULE | Freq: Every day | ORAL | Status: DC
  Administered 2021-10-24 – 2021-10-26 (×3): 1 via ORAL
  Filled 2021-10-23 (×6): qty 1

## 2021-10-23 MED ORDER — AMANTADINE HCL 100 MG PO CAPS
100.0000 mg | ORAL_CAPSULE | Freq: Two times a day (BID) | ORAL | Status: DC
  Administered 2021-10-23 – 2021-10-26 (×6): 100 mg via ORAL
  Filled 2021-10-23: qty 14
  Filled 2021-10-23: qty 1
  Filled 2021-10-23: qty 14
  Filled 2021-10-23 (×10): qty 1

## 2021-10-23 MED ORDER — PSYLLIUM 95 % PO PACK
1.0000 | PACK | Freq: Every day | ORAL | Status: DC
Start: 2021-10-23 — End: 2021-10-26
  Administered 2021-10-25 – 2021-10-26 (×2): 1 via ORAL
  Filled 2021-10-23 (×2): qty 1
  Filled 2021-10-23: qty 7
  Filled 2021-10-23 (×3): qty 1

## 2021-10-23 MED ORDER — GABAPENTIN 100 MG PO CAPS: 100.0000 mg | ORAL_CAPSULE | Freq: Three times a day (TID) | ORAL | Status: DC | PRN

## 2021-10-23 NOTE — Progress Notes (Signed)
Adult Psychoeducational Group Note  Date:  10/23/2021 Time:  9:02 PM  Group Topic/Focus:  Wrap-Up Group:   The focus of this group is to help patients review their daily goal of treatment and discuss progress on daily workbooks.  Participation Level:  Did Not Attend  Participation Quality:   Did Not Attend  Affect:   Did Not Attend  Cognitive:   Did Not Attend  Insight: None  Engagement in Group:  Did Not Attend  Modes of Intervention:   Did Not Attend   Additional Comments:  Pt did not attend evening wrap up group tonight.  Felipa Furnace 10/23/2021, 9:02 PM

## 2021-10-23 NOTE — [facility] Group Notes (Signed)
Adult Psychoeducational Group Note  Date:  10/23/2021 Time:  6:42 PM  Group Topic/Focus:  Goals Group:   The focus of this group is to help patients establish daily goals to achieve during treatment and discuss how the patient can incorporate goal setting into their daily lives to aide in recovery.  Participation Level:  Active  Participation Quality:  Appropriate  Affect:  Appropriate  Cognitive:  Appropriate  Insight: Appropriate  Engagement in Group:  Engaged  Modes of Intervention:  Discussion  Additional Comments:  Patient attended morning goal setting group and participated.   Destin Kittler W Kazumi Lachney 10/23/2021, 6:42 PM

## 2021-10-23 NOTE — [facility] Group Notes (Signed)
.  Psychoeducational Group Note  Date 10/23/2021 Time: 0900-1000    Goal Setting   Purpose of Group: Group Focus: affirmation, clarity of thought, and goals/reality orientation Treatment Modality:  Psychoeducation Interventions utilized were assignment, group exercise, and support  Purpose: To be able to understand and verbalize the reason for their admission to the hospital. To understand that the medication helps with their chemical imbalance but they also need to work on their choices in life. To be challenged to develop a list of 30 positives about themselves. Also introduce the concept that "feelings" are not reality.    Participation Level:  Active  Participation Quality:  Appropriate  Affect:  Appropriate  Cognitive:  Appropriate  Insight:  Improving  Engagement in Group:  Engaged  Additional Comments: Rates her energy at a 9/10. Pt's affect less intense this week  Vira Blanco A

## 2021-10-23 NOTE — Group Note (Signed)
LCSW Group Therapy Note  10/23/2021   10:00-11:00am   Type of Therapy and Topic:  Group Therapy: Anger Cues and Responses  Participation Level:  Minimal   Description of Group:   In this group, patients learned how to recognize the physical, cognitive, emotional, and behavioral responses they have to anger-provoking situations.  They identified a recent time they became angry and how they reacted.  They analyzed how their reaction was possibly beneficial and how it was possibly unhelpful.  The group discussed a variety of healthier coping skills that could help with such a situation in the future.  Focus was placed on how helpful it is to recognize the underlying emotions to our anger, because working on those can lead to a more permanent solution as well as our ability to focus on the important rather than the urgent.  Therapeutic Goals: Patients will remember their last incident of anger and how they felt emotionally and physically, what their thoughts were at the time, and how they behaved. Patients will identify how their behavior at that time worked for them, as well as how it worked against them. Patients will explore possible new behaviors to use in future anger situations. Patients will learn that anger itself is normal and cannot be eliminated, and that healthier reactions can assist with resolving conflict rather than worsening situations.  Summary of Patient Progress:  The patient shared that her most recent time of anger was 2-3 days ago when she found out her sister is the one who committed her to this hospital and said her sister is in prison currently so she cannot talk to her about it.  While this did not seem to be logical, CSW did not ask further questions about it.  The patient did not make any more comments during group, but she did remain attentive.  Therapeutic Modalities:   Cognitive Behavioral Therapy  Lynnell Chad

## 2021-10-23 NOTE — Progress Notes (Signed)
[facility] MD Progress Note  10/23/2021 5:04 PM [name]  MRN:  [id number] Subjective:   The patient is a 65 year old female with a past psychiatric history of unspecified psychotic disorder presenting under IVC for paranoia and delusions. She believes that there are people in the woods near her house that have tried to shoot her.   The patient's chart was reviewed and nursing notes were reviewed. Over the past 24 hrs, there were no documented behavioral issues, no PRN medications given for agitation.The patient's case was discussed in multidisciplinary team meeting.  The patient was noted to have attended some groups.   The psychiatry team made the following medication changes yesterday: -Increase Zyprexa to 7.5 mg nightly -Taper Cogentin  Today's interview Patient was seen and assessed at bedside this morning.  She reports that her mood is "good" today. She went to group this morning. She has a bit of stiffness moving around in the mornings but it is better as the day goes on. She slept well overnight and is eating okay. [name] not worried about being spied on today. [name] still having constipation issues and had milk of magnesia today. No hallucinations.    Principal Problem: Schizophrenia (HCC) Diagnosis: Principal Problem:   Schizophrenia (HCC) Active Problems:   Mild cognitive impairment    Past Psychiatric History: schizophrenia   Past Medical History:  Past Medical History:  Diagnosis Date   Eye globe prosthesis    GERD (gastroesophageal reflux disease)    History of blood transfusion 1973   "related to abscess burst in my stomach"   Hyperlipemia    Hyperlipidemia    Hypertension    Osteoarthritis of back    Lowerback    SVD (spontaneous vaginal delivery)    x 1, baby died at 2 wks of age   Type II diabetes mellitus (HCC)     Past Surgical History:  Procedure Laterality Date   APPENDECTOMY     COLONOSCOPY     DILATATION & CURETTAGE/HYSTEROSCOPY WITH MYOSURE N/A  02/25/2015   Procedure: DILATATION & CURETTAGE/HYSTEROSCOPY WITH MYOSURE;  Surgeon: [name], MD;  Location: [facility];  Service: Gynecology;  Laterality: N/A;   DILATION AND CURETTAGE OF UTERUS     ENUCLEATION Right 03/16/2017   ENUCLEATION Right 03/16/2017   Procedure: ENUCLEATION RIGHT EYE;  Surgeon: [name], MD;  Location: [facility];  Service: Ophthalmology;  Laterality: Right;   EYE SURGERY     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6, 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   EYE SURGERY Right ~ 1974   "S/P initial eye injury; scissors stuck in my eye""   LAPAROSCOPIC CHOLECYSTECTOMY     SHOULDER ARTHROSCOPY WITH ROTATOR CUFF REPAIR Left    wire stitches per patient   SHOULDER ARTHROSCOPY WITH ROTATOR CUFF REPAIR Right    Family History:  Family History  Problem Relation Age of Onset   Diabetes Mother    CAD Mother    Lung cancer Father    Family Psychiatric  History: unknown Social History:  Social History   Substance and Sexual Activity  Alcohol Use Yes   Comment: 03/16/2017 "nothing since 2013"     Social History   Substance and Sexual Activity  Drug Use No    Social History   Socioeconomic History   Marital status: Widowed    Spouse name: Not on file   Number of children: Not on file   Years of education: Not on file   Highest education level: Not on  file  Occupational History   Not on file  Tobacco Use   Smoking status: Former    Packs/day: 0.50    Years: 40.00    Pack years: 20.00    Types: Cigarettes    Quit date: 2016    Years since quitting: 6.8   Smokeless tobacco: Never  Vaping Use   Vaping Use: Never used  Substance and Sexual Activity   Alcohol use: Yes    Comment: 03/16/2017 "nothing since 2013"   Drug use: No   Sexual activity: Not Currently    Birth control/protection: Post-menopausal  Other Topics Concern   Not on file  Social History Narrative   Not on file   Social Determinants of Health   Financial Resource Strain: Not on file  Food Insecurity: Not on file   Transportation Needs: Not on file  Physical Activity: Not on file  Stress: Not on file  Social Connections: Not on file   Additional Social History:  Specify valuables returned: see belongings sheet     Sleep: Fair  Appetite:  Fair  Current Medications: Current Facility-Administered Medications  Medication Dose Route Frequency Provider Last Rate Last Admin   acetaminophen (TYLENOL) tablet 650 mg  650 mg Oral Q6H PRN Oneta Rack, NP   650 mg at 10/18/21 0835   alum & mag hydroxide-simeth (MAALOX/MYLANTA) 200-200-20 MG/5ML suspension 30 mL  30 mL Oral Q4H PRN Oneta Rack, NP   30 mL at 10/23/21 1458   atenolol (TENORMIN) tablet 12.5 mg  12.5 mg Oral Daily Carlyn Reichert, MD   12.5 mg at 10/23/21 0819   atorvastatin (LIPITOR) tablet 40 mg  40 mg Oral Daily Oneta Rack, NP   40 mg at 10/23/21 0819   benztropine (COGENTIN) tablet 0.5 mg  0.5 mg Oral BID Park Pope, MD   0.5 mg at 10/23/21 0819   divalproex (DEPAKOTE) DR tablet 500 mg  500 mg Oral QHS Oneta Rack, NP   500 mg at 10/22/21 2155   docusate sodium (COLACE) capsule 100 mg  100 mg Oral Daily Park Pope, MD   100 mg at 10/23/21 4944   feeding supplement (BOOST / RESOURCE BREEZE) liquid 1 Container  1 Container Oral BID BM Phineas Inches, MD   1 Container at 10/23/21 1457   hydrOXYzine (ATARAX/VISTARIL) tablet 25 mg  25 mg Oral TID PRN Oneta Rack, NP   25 mg at 10/17/21 2134   ibuprofen (ADVIL) tablet 600 mg  600 mg Oral TID PRN Bobbitt, Shalon E, NP   600 mg at 10/22/21 2155   lisinopril (ZESTRIL) tablet 10 mg  10 mg Oral Daily Carlyn Reichert, MD   10 mg at 10/23/21 0820   magnesium hydroxide (MILK OF MAGNESIA) suspension 30 mL  30 mL Oral Daily PRN Oneta Rack, NP   30 mL at 10/23/21 9675   menthol-cetylpyridinium (CEPACOL) lozenge 3 mg  1 lozenge Oral PRN Park Pope, MD   3 mg at 10/22/21 1747   methimazole (TAPAZOLE) tablet 10 mg  10 mg Oral Daily Carlyn Reichert, MD   10 mg at 10/23/21 0819    OLANZapine (ZYPREXA) tablet 10 mg  10 mg Oral QHS Massengill, Harrold Donath, MD   10 mg at 10/22/21 2155   pantoprazole (PROTONIX) EC tablet 40 mg  40 mg Oral BID AC Princess Bruins, DO   40 mg at 10/23/21 9163   traZODone (DESYREL) tablet 50 mg  50 mg Oral QHS PRN Oneta Rack, NP  50 mg at 10/21/21 2138   traZODone (DESYREL) tablet 50 mg  50 mg Oral QHS Massengill, Harrold Donath, MD   50 mg at 10/22/21 2155    Lab Results:  No results found for this or any previous visit (from the past 48 hour(s)).    Blood Alcohol level:  Lab Results  Component Value Date   ETH <10 10/07/2021   ETH <10 08/10/2021    Metabolic Disorder Labs: Lab Results  Component Value Date   HGBA1C 5.6 10/07/2021   MPG 114.02 10/07/2021   No results found for: PROLACTIN Lab Results  Component Value Date   CHOL 253 (H) 10/07/2021   TRIG 99 10/07/2021   HDL 56 10/07/2021   CHOLHDL 4.5 10/07/2021   VLDL 20 10/07/2021   LDLCALC 177 (H) 10/07/2021    Physical Findings: AIMS: 0 on 11/10  Musculoskeletal: Strength & Muscle Tone: within normal limits Gait & Station: Within normal limits Patient leans: NA  Psychiatric Specialty Exam:  Presentation  General Appearance: appropriate for environment, new clothes Eye Contact:Fair  Speech:normal rate and fluency  Speech Volume:Normal  Handedness:Right  Mood and Affect  Mood: "good" Affect: euthymic  Thought Process  Thought Processes:Coherent  Descriptions of Associations: intact Orientation: oriented to person place, time and event  Thought Content: Patient is fixated, and sister causing her to be admitted to this [name], these could be paranoid delusions.  She otherwise denies suicidal thoughts and auditory/visual hallucinations  History of Schizophrenia/Schizoaffective disorder:Yes  Duration of Psychotic Symptoms:Greater than six months  Hallucinations: denies  Ideas of Reference: none  Suicidal Thoughts: denies  Homicidal Thoughts:  Denies   Sensorium  Memory:Immediate Fair  Judgment:Fair  Insight:Shallow   Executive Functions  Concentration:Fair  Attention Span:Fair  Recall:Fair  Fund of Knowledge:Fair  Language:Fair   Psychomotor Activity  Psychomotor Activity: Cogwheeling appreciated at right wrist, more pronounced with distraction   Assets  Assets:Leisure Time   Sleep  Sleep: fair Patient slept Number of Hours: 6.75   Physical Exam Vitals reviewed.  Constitutional:      Appearance: [name] not ill-appearing or toxic-appearing.  HENT:     Head: Normocephalic.  Pulmonary:     Effort: Pulmonary effort is normal.  Neurological:     General: No focal deficit present.     Mental Status: [name] alert.   Review of Systems  Respiratory:  Negative for shortness of breath.   Cardiovascular:  Negative for chest pain.  Gastrointestinal:  Negative for nausea and vomiting.   Blood pressure 118/67, pulse (!) 59, temperature 98.3 F (36.8 C), temperature source Oral, resp. rate 18, height 5\' 3"  (1.6 m), weight 60.8 kg, SpO2 100 %. Body mass index is 23.74 kg/m.   Treatment Plan Summary: Daily contact with patient to assess and evaluate symptoms and progress in treatment and Medication management  Assessment #Schizophrenia #Afib #hyperthyroidism #sciatica #GERD    Schizophrenia: -Zyprexa 10mg  PO QHS -Stopped cogentin, started amantadine. Cogentin is very constipating.  -Continue home Depakote 500 mg for mood stabilization             -Depakote level of 0 on admission  -Repeat Depakote level of 30  -Given that the patient does not have significant mood lability or persistent irritability will continue at current dose Antipsychotic labs             EKG: sinus brady, some inverted T-waves, discussed with ED attending, Qtc of 427             Lipids: chol  of 253, LDL 177 (collected at 2 AM)             A1C: 5.6 AIMS: 0 on 11/10 Motor: Cogwheeling of R wrist noted, resolved on 11/12,  re-emerged on 11/14, still present on 11/17 despite discontinuation of Risperdal and keeping Cogentin on.  Medical Management Covid negative CMP: unremarkable CBC: unremarkable EtOH: <10 UDS: negative Upreg: NA TSH: 1.2 UA: leukocytes, negative nitrites  Constipation, chronic:  Minimizing constipating agents (anticholinergics) Starting bowel management agents: probiotics, fiber Scheduled colace, though there is some evidence that a spasmotic is a better agent in the elderly.     Abnormal UA Patient denies symptoms and patient has refused antibiotic saying that she does not have the symptoms of a UTI.  -Repeat UA on 11/10 shows trace leukocytes and 5 ketones otherwise WNL  HTN -Home lisinopril 10 mg  Hyperthyroidism A. fib Verified in endocrinology notes. Patient last filled her 47 d supply of methimazole on 7/6 and ran out one month ago. -Continue methimazole -Continue atenolol   Sciatica Offered patient gabapentin or Lyrica, however she says that she tried in the past and they have been ineffective. Discussed with patient trial with lidocaine patch to see if there is any relief. -Lidocaine patch  GERD -Protonix 40 mg twice daily  Roselle Locus, MD  10/23/2021, 5:04 PM

## 2021-10-23 NOTE — [facility] Group Notes (Signed)
.  Psychoeducational Group Note  Date 10/23/2021 Time: 0900-1000    Goal Setting   Purpose of Group: Group Focus: affirmation, clarity of thought, and goals/reality orientation Treatment Modality:  Psychoeducation Interventions utilized were assignment, group exercise, and support  Purpose: To be able to understand and verbalize the reason for their admission to the hospital. To understand that the medication helps with their chemical imbalance but they also need to work on their choices in life. To be challenged to develop [name] list of 30 positives about themselves. Also introduce the concept that "feelings" are not reality.    Participation Level:  Active  Participation Quality:  Appropriate  Affect:  Appropriate  Cognitive:  Appropriate  Insight:  Improving  Engagement in Group:  Engaged  Additional Comments: Rates energy at [name] 9/10. Affect is [name] bit brighter this afternoon. Participating in the group  [name], [facility]ari [name]

## 2021-10-23 NOTE — Progress Notes (Signed)
    10/22/21 2100  Psych Admission Type (Psych Patients Only)  Admission Status Involuntary  Psychosocial Assessment  Patient Complaints Anxiety  Eye Contact Fair  Facial Expression Flat;Worried  Affect Appropriate to circumstance  Speech Logical/coherent  Interaction Minimal  Motor Activity Slow  Appearance/Hygiene Unremarkable  Behavior Characteristics Cooperative  Mood Pleasant;Anxious  Thought Process  Coherency Concrete thinking  Content WDL  Delusions None reported or observed  Perception WDL  Hallucination None reported or observed  Judgment Limited  Confusion None  Danger to Self  Current suicidal ideation? Denies  Danger to Others  Danger to Others None reported or observed

## 2021-10-23 NOTE — Progress Notes (Signed)
   10/23/21 1600  Psych Admission Type (Psych Patients Only)  Admission Status Involuntary  Psychosocial Assessment  Patient Complaints None  Eye Contact Fair  Facial Expression Flat;Worried  Affect Appropriate to circumstance  Speech Logical/coherent  Interaction Minimal  Motor Activity Slow  Appearance/Hygiene Unremarkable  Behavior Characteristics Cooperative  Mood Pleasant;Anxious  Aggressive Behavior  Effect No apparent injury  Thought Process  Coherency Concrete thinking  Content WDL  Delusions None reported or observed  Perception WDL  Hallucination None reported or observed  Judgment Limited  Confusion None  Danger to Self  Current suicidal ideation? Denies  Danger to Others  Danger to Others None reported or observed

## 2021-10-23 NOTE — Progress Notes (Signed)
Pt in stable mood. States she does not have any complaints. Pt verbalized having a good day. Pt withdrawn from  interaction with peers. Pt mostly stays in room during her free time. No extrapyramidal movements noticed upon assessment. Pleasant and cooperative during conversation with staff. Verbalized bilateral chronic leg numbness that does not impair ambulation.   During the day, pt given PRN pain medication for chronic leg numbness. Pt verbalized some relief with meds.     10/23/21 2010  Psych Admission Type (Psych Patients Only)  Admission Status Involuntary  Psychosocial Assessment  Patient Complaints None  Eye Contact Avoids  Facial Expression Flat  Affect Appropriate to circumstance  Speech Logical/coherent  Interaction Minimal  Motor Activity Slow  Appearance/Hygiene Unremarkable;In hospital gown  Behavior Characteristics Cooperative  Mood Pleasant  Thought Process  Coherency Concrete thinking  Content WDL  Delusions None reported or observed  Perception WDL  Hallucination None reported or observed  Judgment Limited  Confusion None  Danger to Self  Current suicidal ideation? Denies  Danger to Others  Danger to Others None reported or observed

## 2021-10-24 LAB — RESP PANEL BY RT-PCR (FLU A&B, COVID) ARPGX2
Influenza A by PCR: NEGATIVE
Influenza B by PCR: NEGATIVE
SARS Coronavirus 2 by RT PCR: NEGATIVE

## 2021-10-24 NOTE — Progress Notes (Signed)
[facility] MD Progress Note  10/24/2021 9:37 AM Shari [name]  MRN:  [id number] Subjective:   The patient is a 65 year old female with a past psychiatric history of unspecified psychotic disorder presenting under IVC for paranoia and delusions. She believes that there are people in the woods near her [place] that have tried to shoot her.   The patient's chart was reviewed and nursing notes were reviewed. Over the past 24 hrs, there were no documented behavioral issues, no PRN medications given for agitation.The patient's case was discussed in multidisciplinary team meeting.  The patient was noted to have attended some groups.   The psychiatry team made the following medication changes yesterday: -Changed cogentin to Amantadine  Today's interview Patient was seen and assessed at bedside this morning.  Patient reports she slept well through the night last night. Patient also states she has been having fewer perseverative thoughts. Patient reports that her mood is otherwise unchanged.  Patient denies feeling paranoid and feels that the medication at this point is appropriate for her.  Patient reports that she has been having more regular bowel movements as well.  She otherwise denies auditory hallucinations and visual hallucinations. Cogwheeling rigidity is less prominent but still present at the right wrist.  There are no other tremors or rigidity. Gait is within normal limits.  Denies feeling restless.  Denies muscle spasms. Appetite is stable.  Concentration has not changed. She denies having suicidal thoughts.  She denies having homicidal thoughts.  Discussed plan with patient to continue Zyprexa at 10 mg as well as continuing amantadine while she is still having cogwheeling rigidity.  Patient verbalized understanding and agreement.  All questions were answered.    Principal Problem: Schizophrenia (HCC) Diagnosis: Principal Problem:   Schizophrenia (HCC) Active Problems:   Mild cognitive  impairment    Past Psychiatric History: schizophrenia   Past Medical History:  Past Medical History:  Diagnosis Date   Eye globe prosthesis    GERD (gastroesophageal reflux disease)    History of blood transfusion 1973   "related to abscess burst in my stomach"   Hyperlipemia    Hyperlipidemia    Hypertension    Osteoarthritis of back    Lowerback    SVD (spontaneous vaginal delivery)    x 1, baby died at 2 wks of age   Type II diabetes mellitus (HCC)     Past Surgical History:  Procedure Laterality Date   APPENDECTOMY     COLONOSCOPY     DILATATION & CURETTAGE/HYSTEROSCOPY WITH MYOSURE N/A 02/25/2015   Procedure: DILATATION & CURETTAGE/HYSTEROSCOPY WITH MYOSURE;  Surgeon: [name], MD;  Location: [facility];  Service: Gynecology;  Laterality: N/A;   DILATION AND CURETTAGE OF UTERUS     ENUCLEATION Right 03/16/2017   ENUCLEATION Right 03/16/2017   Procedure: ENUCLEATION RIGHT EYE;  Surgeon: [name], MD;  Location: [facility];  Service: Ophthalmology;  Laterality: Right;   EYE SURGERY     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6, 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   EYE SURGERY Right ~ 1974   "S/P initial eye injury; scissors stuck in my eye""   LAPAROSCOPIC CHOLECYSTECTOMY     SHOULDER ARTHROSCOPY WITH ROTATOR CUFF REPAIR Left    wire stitches per patient   SHOULDER ARTHROSCOPY WITH ROTATOR CUFF REPAIR Right    Family History:  Family History  Problem Relation Age of Onset   Diabetes Mother    CAD Mother    Lung cancer Father    Family Psychiatric  History: unknown Social History:  Social History   Substance and Sexual Activity  Alcohol Use Yes   Comment: 03/16/2017 "nothing since 2013"     Social History   Substance and Sexual Activity  Drug Use No    Social History   Socioeconomic History   Marital status: Widowed    Spouse name: Not on file   Number of children: Not on file   Years of education: Not on file   Highest education level: Not on file  Occupational History   Not  on file  Tobacco Use   Smoking status: Former    Packs/day: 0.50    Years: 40.00    Pack years: 20.00    Types: Cigarettes    Quit date: 2016    Years since quitting: 6.8   Smokeless tobacco: Never  Vaping Use   Vaping Use: Never used  Substance and Sexual Activity   Alcohol use: Yes    Comment: 03/16/2017 "nothing since 2013"   Drug use: No   Sexual activity: Not Currently    Birth control/protection: Post-menopausal  Other Topics Concern   Not on file  Social History Narrative   Not on file   Social Determinants of Health   Financial Resource Strain: Not on file  Food Insecurity: Not on file  Transportation Needs: Not on file  Physical Activity: Not on file  Stress: Not on file  Social Connections: Not on file   Additional Social History:  Specify valuables returned: see belongings sheet     Sleep: Fair  Appetite:  Fair  Current Medications: Current Facility-Administered Medications  Medication Dose Route Frequency Provider Last Rate Last Admin   acetaminophen (TYLENOL) tablet 650 mg  650 mg Oral Q6H PRN Oneta Rack, NP   650 mg at 10/18/21 0835   acidophilus (RISAQUAD) capsule 1 capsule  1 capsule Oral Daily Hill, Shelbie Hutching, MD   1 capsule at 10/24/21 0818   alum & mag hydroxide-simeth (MAALOX/MYLANTA) 200-200-20 MG/5ML suspension 30 mL  30 mL Oral Q4H PRN Oneta Rack, NP   30 mL at 10/23/21 1458   amantadine (SYMMETREL) capsule 100 mg  100 mg Oral BID Roselle Locus, MD   100 mg at 10/24/21 0819   atenolol (TENORMIN) tablet 12.5 mg  12.5 mg Oral Daily Carlyn Reichert, MD   12.5 mg at 10/24/21 0820   atorvastatin (LIPITOR) tablet 40 mg  40 mg Oral Daily Oneta Rack, NP   40 mg at 10/24/21 0820   divalproex (DEPAKOTE) DR tablet 500 mg  500 mg Oral QHS Oneta Rack, NP   500 mg at 10/23/21 2110   docusate sodium (COLACE) capsule 100 mg  100 mg Oral Daily Park Pope, MD   100 mg at 10/24/21 3149   feeding supplement (BOOST / RESOURCE  BREEZE) liquid 1 Container  1 Container Oral BID BM Massengill, Harrold Donath, MD   1 Container at 10/23/21 1457   gabapentin (NEURONTIN) capsule 100 mg  100 mg Oral TID PRN Roselle Locus, MD       ibuprofen (ADVIL) tablet 600 mg  600 mg Oral TID PRN Bobbitt, Shalon E, NP   600 mg at 10/24/21 0830   lisinopril (ZESTRIL) tablet 10 mg  10 mg Oral Daily Carlyn Reichert, MD   10 mg at 10/24/21 7026   magnesium hydroxide (MILK OF MAGNESIA) suspension 30 mL  30 mL Oral Daily PRN Oneta Rack, NP   30 mL at 10/23/21 [phone]  menthol-cetylpyridinium (CEPACOL) lozenge 3 mg  1 lozenge Oral PRN Park Pope, MD   3 mg at 10/22/21 1747   methimazole (TAPAZOLE) tablet 10 mg  10 mg Oral Daily Carlyn Reichert, MD   10 mg at 10/24/21 0821   OLANZapine (ZYPREXA) tablet 10 mg  10 mg Oral QHS Massengill, Harrold Donath, MD   10 mg at 10/23/21 2110   pantoprazole (PROTONIX) EC tablet 40 mg  40 mg Oral BID AC Princess Bruins, DO   40 mg at 10/24/21 0981   psyllium (HYDROCIL/METAMUCIL) 1 packet  1 packet Oral Daily Hill, Shelbie Hutching, MD       traZODone (DESYREL) tablet 50 mg  50 mg Oral QHS PRN Oneta Rack, NP   50 mg at 10/21/21 2138   traZODone (DESYREL) tablet 50 mg  50 mg Oral QHS Massengill, Harrold Donath, MD   50 mg at 10/23/21 2113    Lab Results:  Results for orders placed or performed during the hospital encounter of 10/07/21 (from the past 48 hour(s))  Resp Panel by RT-PCR (Flu A&B, Covid) Nasopharyngeal Swab     Status: None   Collection Time: 10/24/21  4:16 AM   Specimen: Nasopharyngeal Swab; Nasopharyngeal(NP) swabs in vial transport medium  Result Value Ref Range   SARS Coronavirus 2 by RT PCR NEGATIVE NEGATIVE    Comment: (NOTE) SARS-CoV-2 target nucleic acids are NOT DETECTED.  The SARS-CoV-2 RNA is generally detectable in upper respiratory specimens during the acute phase of infection. The lowest concentration of SARS-CoV-2 viral copies this assay can detect is 138 copies/mL. A negative result does not  preclude SARS-Cov-2 infection and should not be used as the sole basis for treatment or other patient management decisions. A negative result may occur with  improper specimen collection/handling, submission of specimen other than nasopharyngeal swab, presence of viral mutation(s) within the areas targeted by this assay, and inadequate number of viral copies(<138 copies/mL). A negative result must be combined with clinical observations, patient history, and epidemiological information. The expected result is Negative.  Fact Sheet for Patients:  BloggerCourse.com  Fact Sheet for Healthcare Providers:  SeriousBroker.it  This test is no t yet approved or cleared by the [place] FDA and  has been authorized for detection and/or diagnosis of SARS-CoV-2 by FDA under an Emergency Use Authorization (EUA). This EUA will remain  in effect (meaning this test can be used) for the duration of the COVID-19 declaration under Section 564(b)(1) of the Act, 21 U.S.C.section 360bbb-3(b)(1), unless the authorization is terminated  or revoked sooner.       Influenza A by PCR NEGATIVE NEGATIVE   Influenza B by PCR NEGATIVE NEGATIVE    Comment: (NOTE) The Xpert Xpress SARS-CoV-2/FLU/RSV plus assay is intended as an aid in the diagnosis of influenza from Nasopharyngeal swab specimens and should not be used as a sole basis for treatment. Nasal washings and aspirates are unacceptable for Xpert Xpress SARS-CoV-2/FLU/RSV testing.  Fact Sheet for Patients: BloggerCourse.com  Fact Sheet for Healthcare Providers: SeriousBroker.it  This test is not yet approved or cleared by the [place] FDA and has been authorized for detection and/or diagnosis of SARS-CoV-2 by FDA under an Emergency Use Authorization (EUA). This EUA will remain in effect (meaning this test can be used) for the duration of  the COVID-19 declaration under Section 564(b)(1) of the Act, 21 U.S.C. section 360bbb-3(b)(1), unless the authorization is terminated or revoked.  Performed at [facility], [address] [place], [place] [address]  Blood Alcohol level:  Lab Results  Component Value Date   ETH <10 10/07/2021   ETH <10 08/10/2021    Metabolic Disorder Labs: Lab Results  Component Value Date   HGBA1C 5.6 10/07/2021   MPG 114.02 10/07/2021   No results found for: PROLACTIN Lab Results  Component Value Date   CHOL 253 (H) 10/07/2021   TRIG 99 10/07/2021   HDL 56 10/07/2021   CHOLHDL 4.5 10/07/2021   VLDL 20 10/07/2021   LDLCALC 177 (H) 10/07/2021    Physical Findings: AIMS: 0 on 11/10  Musculoskeletal: Strength & Muscle Tone: within normal limits Gait & Station: Within normal limits Patient leans: NA  Psychiatric Specialty Exam:  Presentation  General Appearance: appropriate for environment Eye Contact:Fair  Speech:normal rate and fluency  Speech Volume:Normal  Handedness:Right  Mood and Affect  Mood: "ok" Affect: euthymic  Thought Process  Thought Processes:Coherent  Descriptions of Associations: intact Orientation: oriented to person place, time and event  Thought Content: Patient is fixated, and sister causing her to be admitted to this hospital, these could be paranoid delusions.  She otherwise denies suicidal thoughts and auditory/visual hallucinations  History of Schizophrenia/Schizoaffective disorder:Yes  Duration of Psychotic Symptoms:Greater than six months  Hallucinations: denies  Ideas of Reference: none  Suicidal Thoughts: denies  Homicidal Thoughts: Denies   Sensorium  Memory:Immediate Fair  Judgment:Fair  Insight:Shallow   Executive Functions  Concentration:Fair  Attention Span:Fair  Recall:Fair  Fund of Knowledge:Fair  Language:Fair   Psychomotor Activity  Psychomotor Activity: Cogwheeling  appreciated at right wrist, more pronounced with distraction   Assets  Assets:Leisure Time   Sleep  Sleep: fair Patient slept Number of Hours: 6.75   Physical Exam Vitals reviewed.  Constitutional:      Appearance: [name] not ill-appearing or toxic-appearing.  HENT:     Head: Normocephalic.  Pulmonary:     Effort: Pulmonary effort is normal.  Neurological:     General: No focal deficit present.     Mental Status: [name] alert.   Review of Systems  Respiratory:  Negative for shortness of breath.   Cardiovascular:  Negative for chest pain.  Gastrointestinal:  Negative for nausea and vomiting.   Blood pressure 127/76, pulse 60, temperature 97.9 F (36.6 C), temperature source Oral, resp. rate 18, height 5\' 3"  (1.6 m), weight 60.8 kg, SpO2 100 %. Body mass index is 23.74 kg/m.   Treatment Plan Summary: Daily contact with patient to assess and evaluate symptoms and progress in treatment and Medication management  Assessment #Schizophrenia #Afib #hyperthyroidism #sciatica #GERD    Schizophrenia: -Increase Zyprexa from 5 mg to 7.5 mg nightly -Continue amantadine 100 mg twice daily for cogwheeling rigidity -Continue home Depakote 500 mg for mood stabilization             -Depakote level of 0 on admission  -Repeat Depakote level of 30  -Given that the patient does not have significant mood lability or persistent irritability will continue at current dose Antipsychotic labs             EKG: sinus brady, some inverted T-waves, discussed with ED attending, Qtc of 427             Lipids: chol of 253, LDL 177 (collected at 2 AM)             A1C: 5.6 AIMS: 0 on 11/10 Motor: Cogwheeling of R wrist noted, resolved on 11/12, re-emerged on 11/14, still present on 11/17 despite discontinuation of Risperdal and keeping  Cogentin on.  Medical Management Covid negative CMP: unremarkable CBC: unremarkable EtOH: <10 UDS: negative Upreg: NA TSH: 1.2 UA: leukocytes, negative  nitrites    Abnormal UA Patient denies symptoms and patient has refused antibiotic saying that she does not have the symptoms of a UTI.  -Repeat UA on 11/10 shows trace leukocytes and 5 ketones otherwise WNL  HTN -Home lisinopril 10 mg  Hyperthyroidism A. fib Verified in endocrinology notes. Patient last filled her 52 d supply of methimazole on 7/6 and ran out one month ago. -Continue methimazole -Continue atenolol   Sciatica Offered patient gabapentin or Lyrica, however she says that she tried in the past and they have been ineffective. Discussed with patient trial with lidocaine patch to see if there is any relief. -Lidocaine patch  GERD -Protonix 40 mg twice daily  Park Pope, MD Psychiatry Resident, PGY-1 [facility] 10/24/2021, 9:37 AM

## 2021-10-24 NOTE — [facility] Group Notes (Signed)
Adult Psychoeducational Group Note  Date:  10/24/2021 Time:  8:46 PM  Group Topic/Focus:  Wrap-Up Group:   The focus of this group is to help patients review their daily goal of treatment and discuss progress on daily workbooks.  Participation Level:  Active  Participation Quality:  Appropriate and Attentive  Affect:  Appropriate  Cognitive:  Alert and Appropriate  Insight: Appropriate and Good  Engagement in Group:  Engaged  Modes of Intervention:  Discussion and Education  Additional Comments:  Pt attended and participated in wrap up group this evening and rated their day a 9/10, due to them "getting things done" today. Pt goal is to go home soon, pt states that they have been ready for a while to go home.   Chrisandra Netters 10/24/2021, 8:46 PM

## 2021-10-24 NOTE — Progress Notes (Signed)
D:  Patient denied SI and HI, contracts for safety.  Denied A/V hallucinations.   A:  Medications administered per MD orders.  Emotional support and encouragement given patient. R:  Safety maintained with 15 minute checks.  

## 2021-10-24 NOTE — Progress Notes (Signed)
Patient is isolative to her room with minimal interactions. Denies SI/HI/A/VH and verbally contracted for safety.  Complinat with medications. Support and encouragement provided. Q 15 minutes safety checks ongoing without self harm gestures.

## 2021-10-24 NOTE — [facility] Group Notes (Signed)
Psychoeducational Group Note  Date:  10/24/2021 Time:  1300-1400   Group Topic/Focus: This is a continuation of the group from Saturday. Pt's have been asked to formulate a list of 30 positives about themselves. This list is to be read 2 times a day for 30 days, looking in a mirror. Changing patterns of negative self talk. Also discussed is the fact that there have been some people who hurt [place] in the past. We keep that memory alive within [place]. Ways to cope with this are discused   Participation Level:  Active  Participation Quality:  Appropriate  Affect:  Appropriate  Cognitive:  Oriented  Insight: Improving  Engagement in Group:  Engaged  Modes of Intervention:  Activity, Discussion, Education, and Support  Additional Comments:  Rates her energy at an 8/10. Participated in the group. Gave and received feedback.  Dione Housekeeper

## 2021-10-24 NOTE — Group Note (Signed)
[facility] LCSW Group Therapy Note  Date/Time:  10/24/2021 11:00am-11:30am  Type of Therapy and Topic:  Group Therapy:  Healthy and Unhealthy Supports  Participation Level:  Active   Description of Group:  Patients in this group were led in a discussion about the differences between healthy and unhealthy supports.  They were introduced to the idea of adding a variety of healthy supports to address the various needs in their lives, as well as the idea that unhealthy supports need to be eliminated from their lives through the use of appropriate boundary setting.  A song entitled, "My Own Hero" was played and then discussed.  Patients acknowledged as a whole that it is important to be involved in one's own recovery journey, and that by doing so, they are acting as a hero to themselves.  Therapeutic Goals:   1)  discuss the difference between healthy and unhealthy supports  2)  describe the importance of adding healthy supports to stay well once out of the hospital  3)  discuss options for how to address unhealthy supports  5)  encourage active participation in one's own recovery plan    Summary of Patient Progress:  The patient stated that current healthy supports in her life are one sister living in [place] while current unhealthy supports include another sister who uses drugs.  The patient expressed a willingness to add self-support to help in her recovery journey.   Therapeutic Modalities:   Motivational Interviewing Brief Solution-Focused Therapy  Ambrose Mantle, LCSW

## 2021-10-24 NOTE — Plan of Care (Signed)
Nurse discussed anxiety and coping skills with patient. 

## 2021-10-25 NOTE — [facility] Group Notes (Addendum)
Adult Psychoeducational Group Note  Date:  10/25/2021 Time:  3:47 PM  Group Topic/Focus:  Making Healthy Choices:   The focus of this group is to help patients identify negative/unhealthy choices they were using prior to admission and identify positive/healthier coping strategies to replace them upon discharge.  Participation Level:  Active  Participation Quality:  Appropriate  Affect:  Appropriate  Cognitive:  Appropriate  Insight: Appropriate  Engagement in Group:  Engaged  Modes of Intervention:  Discussiong Additional Comments:  Patient attended Psycho-Ed.group and participated.   Alynna Hargrove W Artrell Lawless 10/25/2021, 3:47 PM

## 2021-10-25 NOTE — [facility] Group Notes (Signed)
Spiritual care group on grief and loss facilitated by chaplain Dyanne Carrel, [facility]   Group Goal:   Support / Education around grief and loss   Members engage in facilitated group support and psycho-social education.   Group Description:   Following introductions and group rules, group members engaged in facilitated group dialog and support around topic of loss, with particular support around experiences of loss in their lives. Group Identified types of loss (relationships / self / things) and identified patterns, circumstances, and changes that precipitate losses. Reflected on thoughts / feelings around loss, normalized grief responses, and recognized variety in grief experience. Group noted Worden's four tasks of grief in discussion.   Group drew on Adlerian / Rogerian, narrative, MI,   Patient Progress: Shari [name] participated in group.  She was actively engaged, but there were a few of her peers who were in very acute grief and she did not get to share as much as she may have wanted.  Chaplain will attempt to follow up one-on-one to give her time to share more about the loss of her friend.  Chaplain Dyanne Carrel, Bcc Pager, [phone] 10:14 PM

## 2021-10-25 NOTE — Progress Notes (Addendum)
[facility] MD Progress Note  10/25/2021 7:23 AM Shari [name]  MRN:  [id number] Subjective:   The patient is a 64 year old female with a past psychiatric history of unspecified psychotic disorder presenting under IVC for paranoia and delusions. She believes that there are people in the woods near her house that have tried to shoot her.   The patient's chart was reviewed and nursing notes were reviewed. Over the past 24 hrs, there were no documented behavioral issues, no PRN medications given for agitation.The patient's case was discussed in multidisciplinary team meeting.  The patient was noted to have attended some groups.    Today's interview Patient was seen and assessed at bedside this morning.  Patient reports that sleep is much better over the weekend. Patient reports occasional perseverative thoughts still regarding sister putting her on the unit; however, patient feels that she is able to move past these thoughts. Patient reports that her mood is otherwise unchanged and has been good.  Patient denies feeling paranoid and feels that the medication at this point is appropriate for her.  She otherwise denies auditory hallucinations and visual hallucinations. No cogwheeling rigidity was noted bilaterally today.  There are no other tremors or rigidity. Gait is within normal limits and not shuffling.  Denies feeling restless.  Denies muscle spasms. Appetite is stable.  Concentration has not changed. She denies having suicidal thoughts.  She denies having homicidal thoughts.  Discussed plan with patient to continue Zyprexa at 10 mg, as well as continuing amantadine.  Discussed discharge planning and while patient is uncertain which will tell she would want to go to, she would is willing to go to a hotel or homeless shelter.  Pt encouraged to call hotel on her list today, to inquire about availability, as to assure an available place to stay tomorrow, to go to, instead of homeless shelter.  Discussed  plan for discharge tomorrow and patient verbalizes understanding and agreement.   All questions were answered.   Principal Problem: Schizophrenia (HCC) Diagnosis: Principal Problem:   Schizophrenia (HCC) Active Problems:   Mild cognitive impairment  Past Psychiatric History: schizophrenia   Past Medical History:  Past Medical History:  Diagnosis Date   Eye globe prosthesis    GERD (gastroesophageal reflux disease)    History of blood transfusion 1973   "related to abscess burst in my stomach"   Hyperlipemia    Hyperlipidemia    Hypertension    Osteoarthritis of back    Lowerback    SVD (spontaneous vaginal delivery)    x 1, baby died at 2 wks of age   Type II diabetes mellitus (HCC)     Past Surgical History:  Procedure Laterality Date   APPENDECTOMY     COLONOSCOPY     DILATATION & CURETTAGE/HYSTEROSCOPY WITH MYOSURE N/A 02/25/2015   Procedure: DILATATION & CURETTAGE/HYSTEROSCOPY WITH MYOSURE;  Surgeon: [name], MD;  Location: [facility];  Service: Gynecology;  Laterality: N/A;   DILATION AND CURETTAGE OF UTERUS     ENUCLEATION Right 03/16/2017   ENUCLEATION Right 03/16/2017   Procedure: ENUCLEATION RIGHT EYE;  Surgeon: [name], MD;  Location: [facility];  Service: Ophthalmology;  Laterality: Right;   EYE SURGERY     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6, 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   EYE SURGERY Right ~ 1974   "S/P initial eye injury; scissors stuck in my eye""   LAPAROSCOPIC CHOLECYSTECTOMY     SHOULDER ARTHROSCOPY WITH ROTATOR CUFF REPAIR Left    wire  stitches per patient   SHOULDER ARTHROSCOPY WITH ROTATOR CUFF REPAIR Right    Family History:  Family History  Problem Relation Age of Onset   Diabetes Mother    CAD Mother    Lung cancer Father    Family Psychiatric  History: unknown Social History:  Social History   Substance and Sexual Activity  Alcohol Use Yes   Comment: 03/16/2017 "nothing since 2013"     Social History   Substance and Sexual Activity  Drug Use No     Social History   Socioeconomic History   Marital status: Widowed    Spouse name: Not on file   Number of children: Not on file   Years of education: Not on file   Highest education level: Not on file  Occupational History   Not on file  Tobacco Use   Smoking status: Former    Packs/day: 0.50    Years: 40.00    Pack years: 20.00    Types: Cigarettes    Quit date: 2016    Years since quitting: 6.8   Smokeless tobacco: Never  Vaping Use   Vaping Use: Never used  Substance and Sexual Activity   Alcohol use: Yes    Comment: 03/16/2017 "nothing since 2013"   Drug use: No   Sexual activity: Not Currently    Birth control/protection: Post-menopausal  Other Topics Concern   Not on file  Social History Narrative   Not on file   Social Determinants of Health   Financial Resource Strain: Not on file  Food Insecurity: Not on file  Transportation Needs: Not on file  Physical Activity: Not on file  Stress: Not on file  Social Connections: Not on file   Additional Social History:  Specify valuables returned: see belongings sheet     Sleep: Fair  Appetite:  Fair  Current Medications: Current Facility-Administered Medications  Medication Dose Route Frequency Provider Last Rate Last Admin   acetaminophen (TYLENOL) tablet 650 mg  650 mg Oral Q6H PRN Oneta Rack, NP   650 mg at 10/18/21 0835   acidophilus (RISAQUAD) capsule 1 capsule  1 capsule Oral Daily Hill, Shelbie Hutching, MD   1 capsule at 10/24/21 0818   alum & mag hydroxide-simeth (MAALOX/MYLANTA) 200-200-20 MG/5ML suspension 30 mL  30 mL Oral Q4H PRN Oneta Rack, NP   30 mL at 10/23/21 1458   amantadine (SYMMETREL) capsule 100 mg  100 mg Oral BID Roselle Locus, MD   100 mg at 10/24/21 1633   atenolol (TENORMIN) tablet 12.5 mg  12.5 mg Oral Daily Carlyn Reichert, MD   12.5 mg at 10/24/21 0820   atorvastatin (LIPITOR) tablet 40 mg  40 mg Oral Daily Oneta Rack, NP   40 mg at 10/24/21 0820   divalproex  (DEPAKOTE) DR tablet 500 mg  500 mg Oral QHS Oneta Rack, NP   500 mg at 10/24/21 2050   docusate sodium (COLACE) capsule 100 mg  100 mg Oral Daily Park Pope, MD   100 mg at 10/24/21 0454   feeding supplement (BOOST / RESOURCE BREEZE) liquid 1 Container  1 Container Oral BID BM Trino Higinbotham, Harrold Donath, MD   1 Container at 10/24/21 1500   gabapentin (NEURONTIN) capsule 100 mg  100 mg Oral TID PRN Roselle Locus, MD       ibuprofen (ADVIL) tablet 600 mg  600 mg Oral TID PRN Bobbitt, Shalon E, NP   600 mg at 10/24/21 0830   lisinopril (ZESTRIL)  tablet 10 mg  10 mg Oral Daily Carlyn Reichert, MD   10 mg at 10/24/21 1027   magnesium hydroxide (MILK OF MAGNESIA) suspension 30 mL  30 mL Oral Daily PRN Oneta Rack, NP   30 mL at 10/23/21 2536   menthol-cetylpyridinium (CEPACOL) lozenge 3 mg  1 lozenge Oral PRN Park Pope, MD   3 mg at 10/22/21 1747   methimazole (TAPAZOLE) tablet 10 mg  10 mg Oral Daily Carlyn Reichert, MD   10 mg at 10/24/21 0821   OLANZapine (ZYPREXA) tablet 10 mg  10 mg Oral QHS Lavra Imler, Harrold Donath, MD   10 mg at 10/24/21 2050   pantoprazole (PROTONIX) EC tablet 40 mg  40 mg Oral BID AC Princess Bruins, DO   40 mg at 10/25/21 0710   psyllium (HYDROCIL/METAMUCIL) 1 packet  1 packet Oral Daily Hill, Shelbie Hutching, MD       traZODone (DESYREL) tablet 50 mg  50 mg Oral QHS PRN Oneta Rack, NP   50 mg at 10/21/21 2138   traZODone (DESYREL) tablet 50 mg  50 mg Oral QHS Lesli Issa, Harrold Donath, MD   50 mg at 10/24/21 2051    Lab Results:  Results for orders placed or performed during the hospital encounter of 10/07/21 (from the past 48 hour(s))  Resp Panel by RT-PCR (Flu A&B, Covid) Nasopharyngeal Swab     Status: None   Collection Time: 10/24/21  4:16 AM   Specimen: Nasopharyngeal Swab; Nasopharyngeal(NP) swabs in vial transport medium  Result Value Ref Range   SARS Coronavirus 2 by RT PCR NEGATIVE NEGATIVE    Comment: (NOTE) SARS-CoV-2 target nucleic acids are NOT  DETECTED.  The SARS-CoV-2 RNA is generally detectable in upper respiratory specimens during the acute phase of infection. The lowest concentration of SARS-CoV-2 viral copies this assay can detect is 138 copies/mL. A negative result does not preclude SARS-Cov-2 infection and should not be used as the sole basis for treatment or other patient management decisions. A negative result may occur with  improper specimen collection/handling, submission of specimen other than nasopharyngeal swab, presence of viral mutation(s) within the areas targeted by this assay, and inadequate number of viral copies(<138 copies/mL). A negative result must be combined with clinical observations, patient history, and epidemiological information. The expected result is Negative.  Fact Sheet for Patients:  BloggerCourse.com  Fact Sheet for Healthcare Providers:  SeriousBroker.it  This test is no t yet approved or cleared by the [place] FDA and  has been authorized for detection and/or diagnosis of SARS-CoV-2 by FDA under an Emergency Use Authorization (EUA). This EUA will remain  in effect (meaning this test can be used) for the duration of the COVID-19 declaration under Section 564(b)(1) of the Act, 21 U.S.C.section 360bbb-3(b)(1), unless the authorization is terminated  or revoked sooner.       Influenza A by PCR NEGATIVE NEGATIVE   Influenza B by PCR NEGATIVE NEGATIVE    Comment: (NOTE) The Xpert Xpress SARS-CoV-2/FLU/RSV plus assay is intended as an aid in the diagnosis of influenza from Nasopharyngeal swab specimens and should not be used as a sole basis for treatment. Nasal washings and aspirates are unacceptable for Xpert Xpress SARS-CoV-2/FLU/RSV testing.  Fact Sheet for Patients: BloggerCourse.com  Fact Sheet for Healthcare Providers: SeriousBroker.it  This test is not yet approved or  cleared by the [place] FDA and has been authorized for detection and/or diagnosis of SARS-CoV-2 by FDA under an Emergency Use Authorization (EUA). This EUA will remain in  effect (meaning this test can be used) for the duration of the COVID-19 declaration under Section 564(b)(1) of the Act, 21 U.S.C. section 360bbb-3(b)(1), unless the authorization is terminated or revoked.  Performed at [facility], [address], [place], [place] [address]       Blood Alcohol level:  Lab Results  Component Value Date   [facility] <10 10/07/2021   ETH <10 08/10/2021    Metabolic Disorder Labs: Lab Results  Component Value Date   HGBA1C 5.6 10/07/2021   MPG 114.02 10/07/2021   No results found for: PROLACTIN Lab Results  Component Value Date   CHOL 253 (H) 10/07/2021   TRIG 99 10/07/2021   HDL 56 10/07/2021   CHOLHDL 4.5 10/07/2021   VLDL 20 10/07/2021   LDLCALC 177 (H) 10/07/2021    Physical Findings: AIMS: score 0 on 10/14/2021  Musculoskeletal: Strength & Muscle Tone: within normal limits Gait & Station: Within normal limits Patient leans: NA  Psychiatric Specialty Exam:  Presentation  General Appearance: appropriate for environment Eye Contact:Fair  Speech:normal rate and fluency  Speech Volume:Normal  Handedness:Right  Mood and Affect  Mood: "ok" Affect: euthymic  Thought Process  Thought Processes:Coherent  Descriptions of Associations: intact Orientation: oriented to person place, time and event  Thought Content: Denies having paranoid thoughts. Pt is not fixated on delusional thoughts or thoughts that her sister forced her to be in this hospitalization. She otherwise denies suicidal thoughts and auditory/visual hallucinations  History of Schizophrenia/Schizoaffective disorder:Yes  Duration of Psychotic Symptoms:Greater than six months  Hallucinations: denies  Ideas of Reference: none  Suicidal Thoughts: denies  Homicidal  Thoughts: Denies   Sensorium  Memory:Immediate Fair  Judgment:Fair  Insight:Shallow   Executive Functions  Concentration:Fair  Attention Span:Fair  Recall:Fair  Fund of Knowledge:Fair  Language:Fair   Psychomotor Activity  Psychomotor Activity: Cogwheeling appreciated at right wrist, more pronounced with distraction   Assets  Assets:Leisure Time   Sleep  Sleep: fair Patient slept Number of Hours: 6.75   Physical Exam Vitals reviewed.  Constitutional:      Appearance: [name] not ill-appearing or toxic-appearing.  HENT:     Head: Normocephalic.  Pulmonary:     Effort: Pulmonary effort is normal.  Neurological:     General: No focal deficit present.     Mental Status: [name] alert.     Motor: No weakness.     Gait: Gait normal.   Review of Systems  Respiratory:  Negative for shortness of breath.   Cardiovascular:  Negative for chest pain.  Gastrointestinal:  Negative for nausea and vomiting.   Blood pressure 119/72, pulse 68, temperature 98.2 F (36.8 C), temperature source Oral, resp. rate 16, height 5\' 3"  (1.6 m), weight 60.8 kg, SpO2 100 %. Body mass index is 23.74 kg/m.   Treatment Plan Summary: Daily contact with patient to assess and evaluate symptoms and progress in treatment and Medication management  Assessment #Schizophrenia #Afib #hyperthyroidism #sciatica #GERD    Schizophrenia: -Continue Zyprexa from 10 mg nightly -Continue amantadine 100 mg twice daily for cogwheeling rigidity -Continue home Depakote 500 mg for mood stabilization             -Depakote level of 0 on admission  -Repeat Depakote level of 30  -Given that the patient does not have significant mood lability or persistent irritability will continue at current dose Antipsychotic labs             EKG: sinus brady, some inverted T-waves, discussed with  ED attending, Qtc of 427             Lipids: chol of 253, LDL 177 (collected at 2 AM)             A1C: 5.6 AIMS: 0 on  11/10 Motor: Cogwheeling rigidity no longer noted on examination today 10/25/2021   Medical Management Covid negative CMP: unremarkable CBC: unremarkable EtOH: <10 UDS: negative Upreg: NA TSH: 1.2 UA: leukocytes, negative nitrites    Abnormal UA Patient denies symptoms and patient has refused antibiotic saying that she does not have the symptoms of a UTI.  -Repeat UA on 11/10 shows trace leukocytes and 5 ketones otherwise WNL  HTN -Home lisinopril 10 mg  Hyperthyroidism A. fib Verified in endocrinology notes. Patient last filled her 13 d supply of methimazole on 7/6 and ran out one month ago. -Continue methimazole -Continue atenolol   Sciatica Offered patient gabapentin or Lyrica, however she says that she tried in the past and they have been ineffective. Discussed with patient trial with lidocaine patch to see if there is any relief. -Lidocaine patch  GERD -Protonix 40 mg twice daily  Park Pope, MD Psychiatry Resident, PGY-1 [facility] 10/25/2021, 7:23 AM   Total Time Spent in Direct Patient Care:  I personally spent 30 minutes on the unit in direct patient care. The direct patient care time included face-to-face time with the patient, reviewing the patient's chart, communicating with other professionals, and coordinating care. Greater than 50% of this time was spent in counseling or coordinating care with the patient regarding goals of hospitalization, psycho-education, and discharge planning needs.  I have independently evaluated the patient during a face-to-face assessment on 10/25/21. I reviewed the patient's chart, and I participated in key portions of the service. I discussed the case with the [employer], and I agree with the assessment and plan of care as documented in the House Officer's note, as addended by me or notated below:  I directly edited the note, as above.  Discharge planning for tomorrow.   Phineas Inches, MD Psychiatrist

## 2021-10-25 NOTE — Progress Notes (Signed)
Pt presents with depressed mood, affect congruent. Shari [name] states she is doing okay overall and states '' i'm just ready to go to my next place. '' She denies any SI/HI with Clinical research associate and appears in no acute distress. Pt completed self inventory rates 0/10 on depression scale, and states her goal is to stay focused.

## 2021-10-25 NOTE — Group Note (Signed)
Occupational Therapy Group Note  Group Topic: Sensory Modulation  Group Date: 10/25/2021 Start Time: 1400 End Time: 1445 Facilitators: Donne Hazel, OT/L   Group Description: Group encouraged increased engagement and participation through discussion focused on sensory modulation and self-soothing through use of the 8 senses. Discussion introduced the concept of sensory modulation and integration, focusing on how we can utilize our body and it's senses to self-soothe or cope, when we are experiencing an over or under-whelming sensation or feeling. Group members were introduced to a sensory diet checklist as a helpful tool/resource that can be utilized to identify what activities and strategies we prefer and do not prefer based upon our response to different stimulus. The concept of alerting vs calming activities was also introduced to understand how to counteract how we are feeling (Example: when we are feeling overwhelmed/stressed, engage in something calming. When we are feeling depressed/low energy, engage in something alerting). Group members engaged actively in discussion sharing their own personal sensory likes/dislikes.      Therapeutic Goal(s):  Identify self-soothing calming vs alerting activities   Identify and create a sensory diet to assist in self-soothing sensory strategies    Participation Level: Active   Participation Quality: Independent   Behavior: Cooperative and Guarded   Speech/Thought Process: Directed   Affect/Mood: Euthymic   Insight: Fair   Judgement: Fair   Individualization: Shari [name] was active in their participation of group discussion/activity. Pt presented as guarded and anxious, however engaged when prompted and was cooperative in activity. Pt completed sensory diet checklist during session and identified benefit of use as a coping strategy.   Modes of Intervention: Activity, Discussion, and Education  Patient Response to Interventions:  Attentive,  Engaged, and Receptive   Plan: Continue to engage patient in OT groups 2 - 3x/week.  10/25/2021  Donne Hazel, OT/L

## 2021-10-25 NOTE — Group Note (Signed)
LCSW Group Therapy Note   Group Date: 10/25/2021 Start Time: 1300 End Time: 1400   Type of Therapy and Topic:  Group Therapy:   Participation Level:  Active  CSW was unable to hold group due to RN group being held during this time.   Aram Beecham, LCSWA 10/25/2021  2:17 PM

## 2021-10-25 NOTE — [facility] Group Notes (Signed)
Pt attended and participated in relaxation group.  

## 2021-10-25 NOTE — [facility] Group Notes (Signed)
Pt attended orientation/goals group. Pt states she is feeling really good today and that her goal is to work on learning more.

## 2021-10-25 NOTE — B[facility] Counselor (Signed)
CSW spoke with this patient who reported she will need to go to the bank to ensure money is put onto her debit card. This patient states she has enough money in her belongings to pay for at least one night at a hotel. Pt wishes to discharge to the bank with a list of nearby hotels which has been provided by CSW.    Ruthann Cancer MSW, LCSW Clincal Social Worker  [facility]

## 2021-10-26 ENCOUNTER — Other Ambulatory Visit: Payer: Self-pay

## 2021-10-26 ENCOUNTER — Encounter (HOSPITAL_COMMUNITY): Payer: Self-pay | Admitting: Family

## 2021-10-26 MED ORDER — OLANZAPINE 10 MG PO TABS: 10.0000 mg | ORAL_TABLET | Freq: Every day | ORAL | 0 refills | Status: DC

## 2021-10-26 MED ORDER — ATENOLOL 25 MG PO TABS: 12.5000 mg | ORAL_TABLET | Freq: Every day | ORAL | 0 refills | Status: DC

## 2021-10-26 MED ORDER — DOCUSATE SODIUM 100 MG PO CAPS: 100.0000 mg | ORAL_CAPSULE | Freq: Every day | ORAL | 0 refills | Status: AC

## 2021-10-26 MED ORDER — AMANTADINE HCL 100 MG PO CAPS
100.0000 mg | ORAL_CAPSULE | Freq: Two times a day (BID) | ORAL | 0 refills | Status: DC
Start: 2021-10-26 — End: 2022-01-18

## 2021-10-26 MED ORDER — DIVALPROEX SODIUM 500 MG PO DR TAB
500.0000 mg | DELAYED_RELEASE_TABLET | Freq: Every day | ORAL | 0 refills | Status: DC
Start: 2021-10-26 — End: 2021-12-29

## 2021-10-26 MED ORDER — PSYLLIUM 95 % PO PACK
1.0000 | PACK | Freq: Every day | ORAL | 0 refills | Status: AC
Start: 2021-10-27 — End: 2021-11-26

## 2021-10-26 MED ORDER — LISINOPRIL 10 MG PO TABS: 10.0000 mg | ORAL_TABLET | Freq: Every day | ORAL | 0 refills | Status: DC

## 2021-10-26 MED ORDER — ATORVASTATIN CALCIUM 40 MG PO TABS
40.0000 mg | ORAL_TABLET | Freq: Every day | ORAL | 0 refills | Status: DC
Start: 2021-10-26 — End: 2022-01-18

## 2021-10-26 MED ORDER — METHIMAZOLE 10 MG PO TABS
10.0000 mg | ORAL_TABLET | Freq: Every day | ORAL | 0 refills | Status: DC
Start: 2021-10-26 — End: 2022-01-18

## 2021-10-26 MED ORDER — PANTOPRAZOLE SODIUM 40 MG PO TBEC
40.0000 mg | DELAYED_RELEASE_TABLET | Freq: Two times a day (BID) | ORAL | 0 refills | Status: DC
Start: 2021-10-26 — End: 2022-01-18

## 2021-10-26 MED ORDER — TRAZODONE HCL 50 MG PO TABS: 50.0000 mg | ORAL_TABLET | Freq: Every day | ORAL | 0 refills | Status: DC

## 2021-10-26 NOTE — Progress Notes (Signed)
Patient verbalizing about discharge this shift, how she is ready to go and find somewhere to live. Denies SI/HI/A/VH. Continues to be isolative in her room. Scheduled medications administered per Provider order. Support and encouragement provided. Routine safety checks conducted every 15 minutes.   Patient notified to inform staff with problems or concerns. No adverse drug reactions noted. Patient contracts for safety at this time. Will continue to monitor.

## 2021-10-26 NOTE — Progress Notes (Signed)
D: Pt A & O X 3. Denies SI, HI, AVH and pain at this time. D/C to hotel as ordered. Picked up in lobby by Benedetto Goad. A: D/C instructions reviewed with pt including prescriptions, medication samples and follow up appointment at [facility]; compliance encouraged. All belongings from locker 37 including wallet, handbag with money returned to pt at time of departure. Scheduled medications given with verbal education and effects monitored. Safety checks maintained without incident till time of d/c.  R: Pt receptive to care. Compliant with medications when offered. Denies adverse drug reactions when assessed. Verbalized understanding related to d/c instructions. Signed belonging sheet in agreement with items received from locker. Ambulatory with a steady gait. Appears to be in no physical distress at time of departure.
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
## 2021-10-26 NOTE — Progress Notes (Signed)
  [facility] Adult Case Management Discharge Plan :  Will you be returning to the same living situation after discharge:  No. Will be discharged to hotel.  At discharge, do you have transportation home?: No. Safe Transport will be arranged Do you have the ability to pay for your medications: Yes,  has insurance  Release of information consent forms completed and in the chart;  Patient's signature needed at discharge.  Patient to Follow up at:  Follow-up Information     [facility], [facility]. Go on 10/27/2021.   Why: You have a hospital follow up appointment with this provider for therapy and medication management services on 10/27/21 at 8:00 am.  Address:  [address] [place], [place]. Contact information: [address] [place] [address] [phone]         [facility]. Go to.   Specialty: Behavioral Health Why: You may go to this provider for therapy and medication management services during walk in hours:  Monday through Wednesday, from 7:45 am to 11:00 am.  Services are provided on a first come, first served basis.  Please arrive early. Contact information: [address] [place] [address] [phone]                Next level of care provider has access to [facility] Link:no  Safety Planning and Suicide Prevention discussed: Yes,  with patient     Has patient been referred to the Quitline?: N/A patient is not a smoker  Patient has been referred for addiction treatment: N/A  Otelia Santee, LCSW 10/26/2021, 11:26 AM

## 2021-11-19 ENCOUNTER — Emergency Department (HOSPITAL_COMMUNITY)
Admission: EM | Admit: 2021-11-19 | Discharge: 2021-11-21 | Disposition: A | Discharge status: Home / Self Care | Payer: Medicare Other | Attending: Emergency Medicine | Admitting: Emergency Medicine

## 2021-11-19 ENCOUNTER — Encounter (HOSPITAL_COMMUNITY): Payer: Self-pay

## 2021-11-19 DIAGNOSIS — Z046 Encounter for general psychiatric examination, requested by authority: Secondary | ICD-10-CM

## 2021-11-19 DIAGNOSIS — F329 Major depressive disorder, single episode, unspecified: Secondary | ICD-10-CM | POA: Insufficient documentation

## 2021-11-19 DIAGNOSIS — I10 Essential (primary) hypertension: Secondary | ICD-10-CM | POA: Insufficient documentation

## 2021-11-19 DIAGNOSIS — Z79899 Other long term (current) drug therapy: Secondary | ICD-10-CM | POA: Diagnosis not present

## 2021-11-19 DIAGNOSIS — E119 Type 2 diabetes mellitus without complications: Secondary | ICD-10-CM | POA: Insufficient documentation

## 2021-11-19 DIAGNOSIS — Z87891 Personal history of nicotine dependence: Secondary | ICD-10-CM | POA: Insufficient documentation

## 2021-11-19 DIAGNOSIS — Z20822 Contact with and (suspected) exposure to covid-19: Secondary | ICD-10-CM | POA: Diagnosis not present

## 2021-11-19 DIAGNOSIS — F200 Paranoid schizophrenia: Secondary | ICD-10-CM | POA: Diagnosis present

## 2021-11-19 DIAGNOSIS — R259 Unspecified abnormal involuntary movements: Secondary | ICD-10-CM | POA: Diagnosis not present

## 2021-11-19 DIAGNOSIS — Z7689 Persons encountering health services in other specified circumstances: Secondary | ICD-10-CM

## 2021-11-19 LAB — CBC WITH DIFFERENTIAL/PLATELET
Abs Immature Granulocytes: 0.01 10*3/uL (ref 0.00–0.07)
Basophils Absolute: 0 10*3/uL (ref 0.0–0.1)
Basophils Relative: 1 %
Eosinophils Absolute: 0.1 10*3/uL (ref 0.0–0.5)
Eosinophils Relative: 1 %
HCT: 42.1 % (ref 36.0–46.0)
Hemoglobin: 13.3 g/dL (ref 12.0–15.0)
Immature Granulocytes: 0 %
Lymphocytes Relative: 50 %
Lymphs Abs: 3 10*3/uL (ref 0.7–4.0)
MCH: 28.7 pg (ref 26.0–34.0)
MCHC: 31.6 g/dL (ref 30.0–36.0)
MCV: 90.7 fL (ref 80.0–100.0)
Monocytes Absolute: 0.3 10*3/uL (ref 0.1–1.0)
Monocytes Relative: 6 %
Neutro Abs: 2.5 10*3/uL (ref 1.7–7.7)
Neutrophils Relative %: 42 %
Platelets: 271 10*3/uL (ref 150–400)
RBC: 4.64 MIL/uL (ref 3.87–5.11)
RDW: 12.7 % (ref 11.5–15.5)
WBC: 6 10*3/uL (ref 4.0–10.5)
nRBC: 0 % (ref 0.0–0.2)
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
## 2021-11-19 NOTE — ED Triage Notes (Addendum)
Pt comes via [employer] with IVC paperwork that was taken out by sister, reports that she is not taken her meds, eating or bathing, per the patient she states that her family was not allowing her to do these things where she was staying, denies SI/HI

## 2021-11-20 DIAGNOSIS — R259 Unspecified abnormal involuntary movements: Secondary | ICD-10-CM | POA: Diagnosis not present

## 2021-11-20 LAB — COMPREHENSIVE METABOLIC PANEL
ALT: 15 U/L (ref 0–44)
AST: 17 U/L (ref 15–41)
Albumin: 4 g/dL (ref 3.5–5.0)
Alkaline Phosphatase: 117 U/L (ref 38–126)
Anion gap: 11 (ref 5–15)
BUN: 11 mg/dL (ref 8–23)
CO2: 21 mmol/L — ABNORMAL LOW (ref 22–32)
Calcium: 9.8 mg/dL (ref 8.9–10.3)
Chloride: 106 mmol/L (ref 98–111)
Creatinine, Ser: 1.08 mg/dL — ABNORMAL HIGH (ref 0.44–1.00)
GFR, Estimated: 57 mL/min — ABNORMAL LOW (ref >60)
Glucose, Bld: 101 mg/dL — ABNORMAL HIGH (ref 70–99)
Potassium: 3.5 mmol/L (ref 3.5–5.1)
Sodium: 138 mmol/L (ref 135–145)
Total Bilirubin: 0.7 mg/dL (ref 0.3–1.2)
Total Protein: 7.5 g/dL (ref 6.5–8.1)

## 2021-11-20 LAB — RESP PANEL BY RT-PCR (FLU A&B, COVID) ARPGX2
Influenza A by PCR: NEGATIVE
Influenza B by PCR: NEGATIVE
SARS Coronavirus 2 by RT PCR: NEGATIVE

## 2021-11-20 LAB — ETHANOL: Alcohol, Ethyl (B): 23 mg/dL — ABNORMAL HIGH (ref <10)

## 2021-11-20 LAB — ACETAMINOPHEN LEVEL: Acetaminophen (Tylenol), Serum: <10 ug/mL — ABNORMAL LOW (ref 10–30)

## 2021-11-20 LAB — SALICYLATE LEVEL: Salicylate Lvl: <7 mg/dL — ABNORMAL LOW (ref 7.0–30.0)

## 2021-11-20 LAB — VALPROIC ACID LEVEL: Valproic Acid Lvl: <10 ug/mL — ABNORMAL LOW (ref 50.0–100.0)

## 2021-11-20 MED ORDER — TRAZODONE HCL 50 MG PO TABS
50.0000 mg | ORAL_TABLET | Freq: Every day | ORAL | Status: DC
  Filled 2021-11-20: qty 1

## 2021-11-20 MED ORDER — ATORVASTATIN CALCIUM 40 MG PO TABS
40.0000 mg | ORAL_TABLET | Freq: Every day | ORAL | Status: DC
  Administered 2021-11-20 – 2021-11-21 (×2): 40 mg via ORAL
  Filled 2021-11-20 (×2): qty 1

## 2021-11-20 MED ORDER — AMANTADINE HCL 100 MG PO CAPS
100.0000 mg | ORAL_CAPSULE | Freq: Two times a day (BID) | ORAL | Status: DC
  Administered 2021-11-20 – 2021-11-21 (×4): 100 mg via ORAL
  Filled 2021-11-20 (×4): qty 1

## 2021-11-20 MED ORDER — OLANZAPINE 10 MG PO TABS
10.0000 mg | ORAL_TABLET | Freq: Every day | ORAL | Status: DC
  Administered 2021-11-20 – 2021-11-21 (×2): 10 mg via ORAL
  Filled 2021-11-20 (×2): qty 1

## 2021-11-20 MED ORDER — ATENOLOL 25 MG PO TABS
12.5000 mg | ORAL_TABLET | Freq: Every day | ORAL | Status: DC
  Administered 2021-11-20 – 2021-11-21 (×2): 12.5 mg via ORAL
  Filled 2021-11-20 (×2): qty 1

## 2021-11-20 MED ORDER — DIVALPROEX SODIUM 250 MG PO DR TAB
500.0000 mg | DELAYED_RELEASE_TABLET | Freq: Every day | ORAL | Status: DC
  Administered 2021-11-20: 500 mg via ORAL
  Filled 2021-11-20 (×2): qty 2

## 2021-11-20 NOTE — ED Provider Notes (Addendum)
Emergency Medicine Observation Re-evaluation Note  Shari [name] is a 65 y.o. female, seen on rounds today.  Pt initially presented to the ED for complaints of IVC Currently, the patient is resting comfortably in bed.  States that she is somewhat hungry.  Denies any complaints other than this at this time.   Physical Exam  BP 135/71 (BP Location: Right Arm)    Pulse 76    Temp 98.4 F (36.9 C) (Oral)    Resp 16    SpO2 98%  Physical Exam CONSTITUTIONAL:  well-appearing, NAD NEURO:  Alert and oriented x 3, no focal deficits EYES:  pupils equal and reactive ENT/NECK:  trachea midline, no JVD CARDIO:  reg rate, reg rhythm, well-perfused PULM:  None labored breathing GI/GU:  Abdomen non-distended MSK/SPINE:  No gross deformities, no edema SKIN:  no rash obvious, atraumatic, no ecchymosis  PSYCH:  Appropriate speech and behavior   ED Course / MDM  EKG:   I have reviewed the labs performed to date as well as medications administered while in observation.  Recent changes in the last 24 hours include none.  Plan  Current plan is for awaiting TTS evaluation. I ordered home meds, ordered diet Tylenol salicylate levels negative/not effective.  I personally reviewed all laboratory work and imaging.  Metabolic panel without any acute abnormality specifically kidney function within normal limits and no significant electrolyte abnormalities. CBC without leukocytosis or significant anemia.   XANIYAH BUCHHOLZ is under involuntary commitment.   [name] medically clear at this time and awaiting TTS evaluation for determination of disposition.       Solon Augusta [place], [place] 11/20/21 1610      Discussed with secretary who informs me that IVC paperwork and first exam are completed and have been returned by magistrate.  Correct message sent to Al Corpus - counselor -to update on this.  Medically cleared. Dispo per TTS eval for recommendations.    Solon Augusta [place], [place] 11/20/21 1419     Margarita Grizzle, MD 11/20/21 [phone]

## 2021-11-20 NOTE — ED Notes (Signed)
Patient calm and cooperative at this time.  Sitting on stretcher in hallway.  Sitter at bedside

## 2021-11-20 NOTE — ED Notes (Signed)
Pt with meal tray, but states she doesn't want to eat right now.

## 2021-11-20 NOTE — ED Notes (Signed)
Pt became aggitated over medication. Pt refused the trazodone, and was hesitant over her other medication. Pt states she doesn't need them and that they make her sleep and feel groggy all day.

## 2021-11-20 NOTE — BH Assessment (Signed)
TTS clinician made another attempt to retrieve IVC paperwork. Per Diplomatic Services operational officer, they are waiting on IVC paperwork from magistrate.
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
## 2021-11-20 NOTE — ED Notes (Signed)
Patient moved to room for TTS assessment.  Sitter at beside

## 2021-11-21 ENCOUNTER — Encounter (HOSPITAL_COMMUNITY): Payer: Self-pay

## 2021-11-21 ENCOUNTER — Other Ambulatory Visit: Payer: Self-pay

## 2021-11-21 DIAGNOSIS — Z7689 Persons encountering health services in other specified circumstances: Secondary | ICD-10-CM

## 2021-11-21 DIAGNOSIS — R259 Unspecified abnormal involuntary movements: Secondary | ICD-10-CM | POA: Diagnosis not present

## 2021-11-21 LAB — RAPID URINE DRUG SCREEN, HOSP PERFORMED
Amphetamines: NOT DETECTED
Barbiturates: NOT DETECTED
Benzodiazepines: NOT DETECTED
Cocaine: NOT DETECTED
Opiates: NOT DETECTED
Tetrahydrocannabinol: NOT DETECTED

## 2021-11-21 LAB — URINALYSIS, ROUTINE W REFLEX MICROSCOPIC
Bilirubin Urine: NEGATIVE
Glucose, UA: NEGATIVE mg/dL
Ketones, ur: NEGATIVE mg/dL
Nitrite: NEGATIVE
Protein, ur: NEGATIVE mg/dL
Specific Gravity, Urine: 1.01 (ref 1.005–1.030)
pH: 6.5 (ref 5.0–8.0)

## 2021-11-21 LAB — URINALYSIS, MICROSCOPIC (REFLEX)

## 2021-11-21 MED ORDER — ACETAMINOPHEN 325 MG PO TABS
650.0000 mg | ORAL_TABLET | Freq: Once | ORAL | Status: AC
  Administered 2021-11-21: 09:00:00 650 mg via ORAL
  Filled 2021-11-21: qty 2

## 2021-11-21 NOTE — ED Notes (Addendum)
.  Pt informed that she is up for discharge. Pt asked where she was living before she came into the hospital pt informed RN that she was living  with her sister but does not/will not go back to live with her. Pt asked if she had anywhere else she could stay and pt denies any other places. RN double checked with pt that she is refusing to go back to her sisters.

## 2021-11-21 NOTE — Consult Note (Addendum)
Telepsych Consultation   Reason for Consult:  Psychiatric Reassessment  Referring Physician:  Lanae Crumbly, PA-C Location of Patient:   [facility] Location of Provider: Other: virtual home office  Patient Identification: Shari [name] MRN:  [id number] Principal Diagnosis: Encounter for psychiatric assessment Diagnosis:  Principal Problem:   Encounter for psychiatric assessment Active Problems:   Paranoid schizophrenia (HCC)   Total Time spent with patient: 30 minutes  Subjective:   Shari [name] is a 65 y.o. female patient with pmhx for Bipolar, MDD with psychosis, admitted via IVC for not taking care of her ADLs and homicidal ideations.  Patient states today, "I've never threatened to hurt anyone and not myself either."  Patient seen via telepsych by this provider; chart reviewed and consulted with Dr. Lucianne Muss on 11/21/21.  On evaluation Shari [name] reports she's been staying with her niece," Crystal" for the past few months; Prior to that was staying at [facility] but was made to leave because "I had too much money in the bank to stay there."  States most of her family abuse drugs and then cannot pay their bills, subsequently look to her to provide money.  States when she declines, they deny her access to the bathroom to complete ADLs, do not feed her and try to get her committed for psychiatric reasons; She endorses "bipolar and depression" does not consistently take her medications d/t drowsiness and lingering effects.  States she does not see anyone outpatient for mental health or med mgmt so has not requested dose adjustments.  [name] a widow, spouse passed away 34 years ago; does not have children and no family or friends whom she can name as supportive.   Spoke with the nurse caring for patient today, she reports patient is eating, completing ADLs without concerns and is medication complaint .   Per records, pt has mild cognitive impairment, no short term or  long term memory concerns seen today.  U/A is pending but she is medically cleared. Per collateral received SW via IVC petitioner, prior to arrival, patient was acutely psychotic. If this was the case, suspect she has cleared up since taking her psychotropic medications for the past 2 days.    During evaluation Shari [name] is seated at the edge of hospital gurney. [name] alert/oriented x 4; calm/cooperative; and mood congruent with affect.  Patient is speaking in a clear tone at moderate volume, and normal pace; with good eye contact.  Her thought process is coherent and relevant; There is no indication that she is currently responding to internal/external stimuli or experiencing delusional thought content.  Patient denies suicidal/self-harm/homicidal ideation, psychosis, and paranoia.  Patient has remained calm throughout assessment and has answered questions appropriately.   Above information is incongruent with information obtained in IVC.  Will have SW reach out to gain collateral.    HPI:  Per Provider Admission Assessment 11/19/2021 Chief Complaint  Patient presents with   IVC      Shari [name] is a 65 y.o. female.   The history is provided by the patient and medical records.    65 year old female with history of GERD, hypertension, hyperlipidemia, diabetes, presenting to the ED under IVC petition by family.  Reportedly for the past 2 weeks she has not been taking her medications, has not been bathing, and overall has not been taking care of herself.  Papers also report that she has voiced wanting to kill family members.   Patient states she  has been staying with her 2 nieces who have not been letting her take showers and not been feeding her.  She did order pizza and chicken wings earlier today.  She denies any thoughts of wanting to harm herself or others.  She denies any hallucinations.    Past Psychiatric History: as outlined below  Risk to Self:  no Risk to Others:  no Prior  Inpatient Therapy:  yes Prior Outpatient Therapy:  unknown  Past Medical History:  Past Medical History:  Diagnosis Date   Eye globe prosthesis    GERD (gastroesophageal reflux disease)    History of blood transfusion 1973   "related to abscess burst in my stomach"   Hyperlipemia    Hyperlipidemia    Hypertension    Osteoarthritis of back    Lowerback    SVD (spontaneous vaginal delivery)    x 1, baby died at 2 wks of age   Type II diabetes mellitus (HCC)     Past Surgical History:  Procedure Laterality Date   APPENDECTOMY     COLONOSCOPY     DILATATION & CURETTAGE/HYSTEROSCOPY WITH MYOSURE N/A 02/25/2015   Procedure: DILATATION & CURETTAGE/HYSTEROSCOPY WITH MYOSURE;  Surgeon: [name], MD;  Location: [facility];  Service: Gynecology;  Laterality: N/A;   DILATION AND CURETTAGE OF UTERUS     ENUCLEATION Right 03/16/2017   ENUCLEATION Right 03/16/2017   Procedure: ENUCLEATION RIGHT EYE;  Surgeon: [name], MD;  Location: [facility];  Service: Ophthalmology;  Laterality: Right;   EYE SURGERY     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6, 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   EYE SURGERY Right ~ 1974   "S/P initial eye injury; scissors stuck in my eye""   LAPAROSCOPIC CHOLECYSTECTOMY     SHOULDER ARTHROSCOPY WITH ROTATOR CUFF REPAIR Left    wire stitches per patient   SHOULDER ARTHROSCOPY WITH ROTATOR CUFF REPAIR Right    Family History:  Family History  Problem Relation Age of Onset   Diabetes Mother    CAD Mother    Lung cancer Father    Family Psychiatric  History: unknown Social History:  Social History   Substance and Sexual Activity  Alcohol Use Yes   Comment: 03/16/2017 "nothing since 2013"     Social History   Substance and Sexual Activity  Drug Use No    Social History   Socioeconomic History   Marital status: Widowed    Spouse name: Not on file   Number of children: Not on file   Years of education: Not on file   Highest education level: Not on file  Occupational History   Not  on file  Tobacco Use   Smoking status: Former    Packs/day: 0.50    Years: 40.00    Pack years: 20.00    Types: Cigarettes    Quit date: 2016    Years since quitting: 6.9   Smokeless tobacco: Never  Vaping Use   Vaping Use: Never used  Substance and Sexual Activity   Alcohol use: Yes    Comment: 03/16/2017 "nothing since 2013"   Drug use: No   Sexual activity: Not Currently    Birth control/protection: Post-menopausal  Other Topics Concern   Not on file  Social History Narrative   Not on file   Social Determinants of Health   Financial Resource Strain: Not on file  Food Insecurity: Not on file  Transportation Needs: Not on file  Physical Activity: Not on file  Stress: Not  on file  Social Connections: Not on file   Additional Social History:    Allergies:   Allergies  Allergen Reactions   Aspirin Hives and Itching   Chocolate Hives and Itching   Cocoa Itching and Rash   Penicillins Hives and Itching    Has patient had a PCN reaction causing IMMEDIATE RASH, FACIAL/TONGUE/THROAT SWELLING, SOB, OR LIGHTHEADEDNESS WITH HYPOTENSION:  #  #  #  YES  #  #  #  Has patient had a PCN reaction causing severe rash involving mucus membranes or skin necrosis: No Has patient had a PCN reaction that required hospitalization No Has patient had a PCN reaction occurring within the last 10 years: No If all of the above answers are "NO", then may proceed with Cephalosporin use.    Sulfa Antibiotics Hives and Itching    Labs:  Results for orders placed or performed during the hospital encounter of 11/19/21 (from the past 48 hour(s))  Resp Panel by RT-PCR (Flu A&B, Covid) Nasopharyngeal Swab     Status: None   Collection Time: 11/19/21 11:11 PM   Specimen: Nasopharyngeal Swab; Nasopharyngeal(NP) swabs in vial transport medium  Result Value Ref Range   SARS Coronavirus 2 by RT PCR NEGATIVE NEGATIVE    Comment: (NOTE) SARS-CoV-2 target nucleic acids are NOT DETECTED.  The SARS-CoV-2  RNA is generally detectable in upper respiratory specimens during the acute phase of infection. The lowest concentration of SARS-CoV-2 viral copies this assay can detect is 138 copies/mL. A negative result does not preclude SARS-Cov-2 infection and should not be used as the sole basis for treatment or other patient management decisions. A negative result may occur with  improper specimen collection/handling, submission of specimen other than nasopharyngeal swab, presence of viral mutation(s) within the areas targeted by this assay, and inadequate number of viral copies(<138 copies/mL). A negative result must be combined with clinical observations, patient history, and epidemiological information. The expected result is Negative.  Fact Sheet for Patients:  BloggerCourse.com  Fact Sheet for Healthcare Providers:  SeriousBroker.it  This test is no t yet approved or cleared by the [place] FDA and  has been authorized for detection and/or diagnosis of SARS-CoV-2 by FDA under an Emergency Use Authorization (EUA). This EUA will remain  in effect (meaning this test can be used) for the duration of the COVID-19 declaration under Section 564(b)(1) of the Act, 21 U.S.C.section 360bbb-3(b)(1), unless the authorization is terminated  or revoked sooner.       Influenza A by PCR NEGATIVE NEGATIVE   Influenza B by PCR NEGATIVE NEGATIVE    Comment: (NOTE) The Xpert Xpress SARS-CoV-2/FLU/RSV plus assay is intended as an aid in the diagnosis of influenza from Nasopharyngeal swab specimens and should not be used as a sole basis for treatment. Nasal washings and aspirates are unacceptable for Xpert Xpress SARS-CoV-2/FLU/RSV testing.  Fact Sheet for Patients: BloggerCourse.com  Fact Sheet for Healthcare Providers: SeriousBroker.it  This test is not yet approved or cleared by the [place]  FDA and has been authorized for detection and/or diagnosis of SARS-CoV-2 by FDA under an Emergency Use Authorization (EUA). This EUA will remain in effect (meaning this test can be used) for the duration of the COVID-19 declaration under Section 564(b)(1) of the Act, 21 U.S.C. section 360bbb-3(b)(1), unless the authorization is terminated or revoked.  Performed at [facility], [address], [place], [place] [address]   CBC with Differential     Status: None   Collection Time:  11/19/21 11:19 PM  Result Value Ref Range   WBC 6.0 4.0 - 10.5 K/uL   RBC 4.64 3.87 - 5.11 MIL/uL   Hemoglobin 13.3 12.0 - 15.0 g/dL   HCT 16.1 09.6 - 04.5 %   MCV 90.7 80.0 - 100.0 fL   MCH 28.7 26.0 - 34.0 pg   MCHC 31.6 30.0 - 36.0 g/dL   RDW 40.9 81.1 - 91.4 %   Platelets 271 150 - 400 K/uL   nRBC 0.0 0.0 - 0.2 %   Neutrophils Relative % 42 %   Neutro Abs 2.5 1.7 - 7.7 K/uL   Lymphocytes Relative 50 %   Lymphs Abs 3.0 0.7 - 4.0 K/uL   Monocytes Relative 6 %   Monocytes Absolute 0.3 0.1 - 1.0 K/uL   Eosinophils Relative 1 %   Eosinophils Absolute 0.1 0.0 - 0.5 K/uL   Basophils Relative 1 %   Basophils Absolute 0.0 0.0 - 0.1 K/uL   Immature Granulocytes 0 %   Abs Immature Granulocytes 0.01 0.00 - 0.07 K/uL    Comment: Performed at [facility], [address] [place], [place] [address]  Comprehensive metabolic panel     Status: Abnormal   Collection Time: 11/19/21 11:19 PM  Result Value Ref Range   Sodium 138 135 - 145 mmol/L   Potassium 3.5 3.5 - 5.1 mmol/L   Chloride 106 98 - 111 mmol/L   CO2 21 (L) 22 - 32 mmol/L   Glucose, Bld 101 (H) 70 - 99 mg/dL    Comment: Glucose reference range applies only to samples taken after fasting for at least 8 hours.   BUN 11 8 - 23 mg/dL   Creatinine, Ser 6.21 (H) 0.44 - 1.00 mg/dL   Calcium 9.8 8.9 - 30.8 mg/dL   Total Protein 7.5 6.5 - 8.1 g/dL   Albumin 4.0 3.5 - 5.0 g/dL   AST 17 15 - 41 U/L   ALT 15 0 - 44 U/L   Alkaline Phosphatase  117 38 - 126 U/L   Total Bilirubin 0.7 0.3 - 1.2 mg/dL   GFR, Estimated 57 (L) >60 mL/min    Comment: (NOTE) Calculated using the CKD-EPI Creatinine Equation (2021)    Anion gap 11 5 - 15    Comment: Performed at [facility], [address], [place], [place] [address]  Ethanol     Status: Abnormal   Collection Time: 11/19/21 11:19 PM  Result Value Ref Range   Alcohol, Ethyl (B) 23 (H) <10 mg/dL    Comment: (NOTE) Lowest detectable limit for serum alcohol is 10 mg/dL.  For medical purposes only. Performed at [facility], [address], [place], [place] [address]   Salicylate level     Status: Abnormal   Collection Time: 11/19/21 11:19 PM  Result Value Ref Range   Salicylate Lvl <7.0 (L) 7.0 - 30.0 mg/dL    Comment: Performed at [facility], [address], [place], [place] [address]  Acetaminophen level     Status: Abnormal   Collection Time: 11/19/21 11:19 PM  Result Value Ref Range   Acetaminophen (Tylenol), Serum <10 (L) 10 - 30 ug/mL    Comment: Performed at [facility], [address] [place], [place] [address]  Valproic acid level     Status: Abnormal   Collection Time: 11/19/21 11:19 PM  Result Value Ref Range   Valproic Acid Lvl <10 (L) 50.0 - 100.0 ug/mL  Comment: RESULTS CONFIRMED BY MANUAL DILUTION Performed at [facility], [address], [place], [place] [address]     Medications:  Current Facility-Administered Medications  Medication Dose Route Frequency Provider Last Rate Last Admin   amantadine (SYMMETREL) capsule 100 mg  100 mg Oral BID Solon Augusta S, PA   100 mg at 11/21/21 0944   atenolol (TENORMIN) tablet 12.5 mg  12.5 mg Oral Daily Solon Augusta S, PA   12.5 mg at 11/21/21 0855   atorvastatin (LIPITOR) tablet 40 mg  40 mg Oral Daily Solon Augusta S, [place]   40 mg at 11/21/21 0855   divalproex (DEPAKOTE) DR tablet 500 mg  500 mg Oral QHS Solon Augusta S, PA   500 mg at 11/20/21 2141   OLANZapine (ZYPREXA) tablet  10 mg  10 mg Oral QHS Solon Augusta S, PA   10 mg at 11/20/21 2142   Current Outpatient Medications  Medication Sig Dispense Refill   atenolol (TENORMIN) 25 MG tablet Take 0.5 tablets (12.5 mg total) by mouth daily. 15 tablet 0   ibuprofen (ADVIL) 800 MG tablet Take 800 mg by mouth 3 (three) times daily as needed for headache, mild pain or fever.     methimazole (TAPAZOLE) 10 MG tablet Take 1 tablet (10 mg total) by mouth daily. 30 tablet 0   pantoprazole (PROTONIX) 40 MG tablet Take 1 tablet (40 mg total) by mouth 2 (two) times daily before a meal. 60 tablet 0   amantadine (SYMMETREL) 100 MG capsule Take 1 capsule (100 mg total) by mouth 2 (two) times daily. (Patient not taking: Reported on 11/20/2021) 60 capsule 0   atorvastatin (LIPITOR) 40 MG tablet Take 1 tablet (40 mg total) by mouth daily. (Patient not taking: Reported on 11/20/2021) 30 tablet 0   divalproex (DEPAKOTE) 500 MG DR tablet Take 1 tablet (500 mg total) by mouth at bedtime. (Patient not taking: Reported on 11/20/2021) 30 tablet 0   docusate sodium (COLACE) 100 MG capsule Take 1 capsule (100 mg total) by mouth daily. (Patient not taking: Reported on 11/20/2021) 30 capsule 0   lisinopril (ZESTRIL) 10 MG tablet Take 1 tablet (10 mg total) by mouth daily. (Patient not taking: Reported on 11/20/2021) 30 tablet 0   OLANZapine (ZYPREXA) 10 MG tablet Take 1 tablet (10 mg total) by mouth at bedtime. (Patient not taking: Reported on 11/20/2021) 30 tablet 0   psyllium (HYDROCIL/METAMUCIL) 95 % PACK Take 1 packet by mouth daily. (Patient not taking: Reported on 11/20/2021) 30 packet 0   traZODone (DESYREL) 50 MG tablet Take 1 tablet (50 mg total) by mouth at bedtime. (Patient not taking: Reported on 11/20/2021) 30 tablet 0    Musculoskeletal: Strength & Muscle Tone: within normal limits Gait & Station: normal Patient leans: N/A  Psychiatric Specialty Exam:  Presentation  General Appearance: Appropriate for Environment; Casual  Eye  Contact:Good  Speech:Clear and Coherent; Normal Rate  Speech Volume:Normal  Handedness:Right   Mood and Affect  Mood:Euthymic  Affect:Appropriate; Congruent   Thought Process  Thought Processes:Coherent; Goal Directed  Descriptions of Associations:Intact  Orientation:Full (Time, Place and Person)  Thought Content:Logical  History of Schizophrenia/Schizoaffective disorder:Yes  Duration of Psychotic Symptoms:Greater than six months  Hallucinations:Hallucinations: None  Ideas of Reference:None  Suicidal Thoughts:Suicidal Thoughts: No  Homicidal Thoughts:Homicidal Thoughts: No   Sensorium  Memory:Immediate Good; Recent Good; Remote Good  Judgment:Good  Insight:Good   Executive Functions  Concentration:Good  Attention Span:Good  Recall:Good  Fund of Knowledge:Good  Language:Good  Psychomotor Activity  Psychomotor Activity:Psychomotor Activity: Normal   Assets  Assets:Communication Skills; Desire for Improvement   Sleep  Sleep:Sleep: Good Number of Hours of Sleep: 8    Physical Exam: Physical Exam Constitutional:      Appearance: Normal appearance.  Cardiovascular:     Rate and Rhythm: Normal rate.     Pulses: Normal pulses.  Pulmonary:     Effort: Pulmonary effort is normal.  Musculoskeletal:     Cervical back: Normal range of motion.  Neurological:     General: No focal deficit present.     Mental Status: [name] alert and oriented to person, place, and time.  Psychiatric:        Mood and Affect: Mood normal.        Behavior: Behavior normal.        Thought Content: Thought content normal.        Judgment: Judgment normal.   Review of Systems  Constitutional: Negative.   HENT: Negative.    Eyes: Negative.   Respiratory: Negative.    Cardiovascular: Negative.   Musculoskeletal: Negative.   Skin: Negative.   Neurological: Negative.   Endo/Heme/Allergies: Negative.   Psychiatric/Behavioral: Negative.    Blood pressure (!)  148/77, pulse 80, temperature 97.9 F (36.6 C), temperature source Oral, resp. rate 16, height  (1.6 m), weight 59 kg, SpO2 100 %. Body mass index is 23.03 kg/m.  Treatment Plan Summary: Per above assessment, there are no grounds for involuntary psychiatric  admission. No positive and negative symptoms of schizophrenia seen today. With the exception of trazodone which I have discontinued d/t excessive drowsiness, recommend she continue home medications and follow-up with [facility] for medication management. Discussed with importance of medication compliance with patient.  Above discussed with patient concordance.   Spoke with Dr. Stevphen Meuse; informed of above recommendation and disposition.  He agrees to enter a transition of care consult.   Disposition: No evidence of imminent risk to self or others at present.   Patient does not meet criteria for psychiatric inpatient admission. Supportive therapy provided about ongoing stressors. Discussed crisis plan, support from social network, calling 911, coming to the Emergency Department, and calling Suicide Hotline.   This service was provided via telemedicine using a 2-way, interactive audio and video technology.  Names of all persons participating in this telemedicine service and their role in this encounter. Name: Shari [name] Role: Patient  Name: Ophelia Shoulder Role: PMHNP    Chales Abrahams, NP 11/21/2021 12:38 PM

## 2021-11-21 NOTE — ED Notes (Signed)
A&O x 4. No signs of distress. Calm. Cooperative.

## 2021-11-21 NOTE — TOC Initial Note (Addendum)
Transition of Care [facility]) - Initial/Assessment Note    Patient Details  Name: Shari [name] MRN: [id number] Date of Birth: Jul 12, 1956  [facility] CM/SW Contact:    Alfredia Ferguson, LCSW Phone Number: 11/21/2021, 3:43 PM  Clinical Narrative:                 [facility] consulted for concerns of patient reporting family takes financial advantage of her. CSW notes patient was cleared and family reports these concerns are not founded as only she has access to her bank account and that she has been presenting with delusions and paranoia. CSW spoke with patient's sister who understood patient was cleared and CSW discussed following up with APS regarding placement/competency.   CSW went to speak with patient regarding her being cleared and discharged. Patient denied having a daughter or sister but when CSW called them my name she responded like she knew them. Patent reported she refuses to go back to either of them and will not go with family. CSW informed patient she has been cleared and would need to go somewhere and provided patient with a list of motels. CSW is concerned regarding this option but per family she has access to her own medications and as she does not have a legal guardian patient has right to self-determination. CSW informed patient she will swing back by in 15-30 mins. CSW informed team of patient's response.   4:00p: CSW followed up with patient about where she was at. Patient reported she was still thinking about it. CSW inquired further about family or friends in the area. Patient denied any family or friends around. Patient reported she just wanted her own place again and has been trying to find a place. Patient and CSW discussed the cost of a motel. CSW reinforced with patient she is cleared and that she will come back down in a little while again for a final decision.  4:25p: CSW met with patient again who reported she was still thinking. CSW informed her she is cleared for  discharge and will need to discharge. CSW informed patient she will discharge here soon and that means she would go back to the lobby. Patient reports she is not discharged and underwriter cannot discharge her. CSW informed patient of being psych and medically cleared again and noted patient stated she will need to see discharge papers before continuing this discussion. CSW updated team.   Expected Discharge Plan: Home/Self Care Barriers to Discharge: ED Patient Insisting on an Alternate Living Situation/Facility   Patient Goals and CMS Choice        Expected Discharge Plan and Services Expected Discharge Plan: Home/Self Care                                              Prior Living Arrangements/Services   Lives with:: Adult Children   Do you feel safe going back to the place where you live?: No               Activities of Daily Living      Permission Sought/Granted                  Emotional Assessment Appearance:: Appears stated age Attitude/Demeanor/Rapport: Apprehensive, Paranoid Affect (typically observed): Blunt, Apprehensive   Alcohol / Substance Use: Not Applicable Psych Involvement: Outpatient Provider  Admission diagnosis:  IVC Patient Active  Problem List   Diagnosis Date Noted   Encounter for psychiatric assessment 11/21/2021   Paranoid schizophrenia ([place]) 10/20/2021   Mild cognitive impairment 10/18/2021   Gastroesophageal reflux disease 02/26/2021   Hypercholesterolemia 02/26/2021   Hypertension 02/26/2021   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   Graves disease 09/20/2019   Palpitations 05/29/2019   Hyperthyroidism 05/29/2019   Mixed dyslipidemia 05/29/2019   Paroxysmal atrial fibrillation ([place]) 05/29/2019   PMB (postmenopausal bleeding) 03/21/2018   Urinary retention 03/21/2018   Uterine enlargement 03/21/2018   Surgery, elective 03/16/2017   PCP:  Ernestene Kiel, MD Pharmacy:    [facility], [place] [address] [place] [address] Phone: [phone] Fax: [phone]  [facility] ([facility] ) - [place], [place] [address] Phone: [phone] Fax: [phone]     Social Determinants of Health (SDOH) Interventions    Readmission Risk Interventions No flowsheet data found.

## 2021-11-21 NOTE — ED Provider Notes (Addendum)
Emergency Medicine Observation Re-evaluation Note  Shari [name] is a 65 y.o. female, seen on rounds today.  Pt initially presented to the ED for complaints of IVC Currently, the patient is resting in bed.  Physical Exam  BP (!) 148/77    Pulse 80    Temp 97.9 F (36.6 C) (Oral)    Resp 16    Ht 5\' 3"  (1.6 m)    Wt 59 kg    SpO2 100%    BMI 23.03 kg/m  Physical Exam CONSTITUTIONAL:  well-appearing, NAD NEURO:  Alert and oriented x 3, no focal deficits EYES:  pupils equal and reactive ENT/NECK:  trachea midline, no JVD CARDIO:  reg rate, reg rhythm, well-perfused PULM:  None labored breathing GI/GU:  Abdomen non-distended MSK/SPINE:  No gross deformities, no edema SKIN:  no rash obvious, atraumatic, no ecchymosis  PSYCH:  Appropriate speech and behavior   ED Course / MDM  EKG:   I have reviewed the labs performed to date as well as medications administered while in observation.  Recent changes in the last 24 hours include none.  Plan  Current plan is for TTS evaluation and recommendation. [name] medically clear. DARLENY SEM is under involuntary commitment.   9:48 AM discussed with [name] on call [facility] APP covering [facility].  She will reach out to the TTS team and make sure that evaluation is completed. Complete IVC paperwork is with RN for this patient.   [facility] [place], DOLE 11/21/21 11/23/21    3:39 PM - Psych NP and TOC team have talked to family and have psychiatrically cleared her with collateral. TOC team has interviewed patient.  [name] medically and psychiatrically cleared at this time. IVC rescinded by 1610. MD.   [facility] [place], DOLE 11/21/21 1539    11/23/21, MD 11/22/21 [phone]

## 2021-11-21 NOTE — ED Notes (Signed)
Patient remains calm and cooperative. She states she does not have a way home or a home to go to. Care team informed.

## 2021-11-21 NOTE — ED Notes (Signed)
Ate breakfast. Tolerated well. A&O x 4. No signs of distress. Calm. Cooperative.

## 2021-11-21 NOTE — ED Notes (Signed)
Pt IVC paperwork was rescended

## 2021-11-21 NOTE — ED Notes (Signed)
Shari [name] John   [phone] pts daughter stated the pt is staying with her daughter. Du[place] number is listed before.

## 2021-11-21 NOTE — ED Notes (Signed)
Pt gave rn permission to call sister and see if she would come get her. Pt agreed that if her sister would come get her she would go with her

## 2021-11-21 NOTE — Discharge Instructions (Addendum)
For assistance for assisted living please contact [facility] DSS at [phone] for their adult placement unit.

## 2021-11-21 NOTE — ED Notes (Signed)
Breakfast orders placed 

## 2021-11-21 NOTE — Progress Notes (Signed)
CSW contacted Alice Vanterpool,sister/Phone#: [phone], of the patient to collect collateral information at the request of the provider. It was reported that patient has recently been experiencing auditory hallucination with commands, persecutory delusions, and visual hallucinations. Moreover, Alice reported that the patient has recently been staying with her daughter Aggie Cosier who informed [name] that the patient has been experiencing hearing voice, not to use the bathroom at her daughter's residence and people are taking her money. It was reported that the patient has not been sleeping, attending to her hygiene, refusing to take medication, and defecating in a bag in the room she has been staying in at the daughter's residence. It was reported that the patient has a history of hospitalizations; last know hospitalization was 2-3 weeks ago in [place]. Alice advised that the patient made statements Friday 11/19/2021, " I'm going to kill all you Bitches", referring to Mr. Earlene Plater a neighbor, [name] (sister), and [employer] (niece). Further, It was reported that the patient has made takes the same day that she will cut throats. Alice provided contact information for Crystal/Phone#: [phone]. Alice added Crystal to the call on three-way, who confirmed statements made. It was confirmed that [name] is the petitioner. Crystal added that she found in the room that the patient was staying in at her residence, bags of feces and urine after [facility] transported the patient to the hospital. [name] and [employer] expressed that they do not feel comfortable with the patient returning to their residences and feels the patient does not want to return to their residence due to the belief that they may be stealing her money. It was reported by Crystal that the patient's money is in an Bank account that she only has access. Alice reported that the family is attempting to arrange placement, however can benefit from guidance on the  process. CSW provided the family with information to [facility] Adult Protective Services Placement/Phone#: [phone]; [facility] Adult Guardianship/Phone#: [phone]; Adult Protective Services main number/Phone#: [phone] or after hours [phone]; Payee Services through [place] [phone]; and the Senior Resources center/Phone#: [phone]. CSW notified provider of communications.  Crissie Reese, MSW, LCSW-A, LCAS-A Phone: [phone] Disposition/TOC

## 2021-11-21 NOTE — ED Notes (Signed)
All belongings given to pt

## 2021-11-21 NOTE — ED Notes (Signed)
Pt ambulated in hallway w/ sitter. No acute distress noted

## 2021-11-22 DIAGNOSIS — R259 Unspecified abnormal involuntary movements: Secondary | ICD-10-CM | POA: Diagnosis not present

## 2021-12-28 ENCOUNTER — Other Ambulatory Visit: Payer: Self-pay

## 2021-12-28 ENCOUNTER — Emergency Department (HOSPITAL_COMMUNITY)
Admission: EM | Admit: 2021-12-28 | Discharge: 2021-12-29 | Disposition: A | Discharge status: Home / Self Care | Payer: Medicare Other | Attending: Emergency Medicine | Admitting: Emergency Medicine

## 2021-12-28 ENCOUNTER — Encounter (HOSPITAL_COMMUNITY): Payer: Self-pay | Admitting: Emergency Medicine

## 2021-12-28 DIAGNOSIS — E119 Type 2 diabetes mellitus without complications: Secondary | ICD-10-CM | POA: Insufficient documentation

## 2021-12-28 DIAGNOSIS — I10 Essential (primary) hypertension: Secondary | ICD-10-CM | POA: Diagnosis not present

## 2021-12-28 DIAGNOSIS — Y900 Blood alcohol level of less than 20 mg/100 ml: Secondary | ICD-10-CM | POA: Diagnosis not present

## 2021-12-28 DIAGNOSIS — Z046 Encounter for general psychiatric examination, requested by authority: Secondary | ICD-10-CM | POA: Insufficient documentation

## 2021-12-28 DIAGNOSIS — Z79899 Other long term (current) drug therapy: Secondary | ICD-10-CM | POA: Diagnosis not present

## 2021-12-28 DIAGNOSIS — G3184 Mild cognitive impairment, so stated: Secondary | ICD-10-CM | POA: Diagnosis not present

## 2021-12-28 DIAGNOSIS — F200 Paranoid schizophrenia: Secondary | ICD-10-CM | POA: Insufficient documentation

## 2021-12-28 DIAGNOSIS — Z20822 Contact with and (suspected) exposure to covid-19: Secondary | ICD-10-CM | POA: Diagnosis not present

## 2021-12-28 DIAGNOSIS — R4589 Other symptoms and signs involving emotional state: Secondary | ICD-10-CM

## 2021-12-28 DIAGNOSIS — R4182 Altered mental status, unspecified: Secondary | ICD-10-CM | POA: Diagnosis present

## 2021-12-28 LAB — CBC
HCT: 39.4 % (ref 36.0–46.0)
Hemoglobin: 12.9 g/dL (ref 12.0–15.0)
MCH: 27.9 pg (ref 26.0–34.0)
MCHC: 32.7 g/dL (ref 30.0–36.0)
MCV: 85.3 fL (ref 80.0–100.0)
Platelets: 369 10*3/uL (ref 150–400)
RBC: 4.62 MIL/uL (ref 3.87–5.11)
RDW: 12.3 % (ref 11.5–15.5)
WBC: 7 10*3/uL (ref 4.0–10.5)
nRBC: 0 % (ref 0.0–0.2)

## 2021-12-28 LAB — COMPREHENSIVE METABOLIC PANEL
ALT: 16 U/L (ref 0–44)
AST: 18 U/L (ref 15–41)
Albumin: 4.3 g/dL (ref 3.5–5.0)
Alkaline Phosphatase: 131 U/L — ABNORMAL HIGH (ref 38–126)
Anion gap: 10 (ref 5–15)
BUN: 9 mg/dL (ref 8–23)
CO2: 24 mmol/L (ref 22–32)
Calcium: 9.6 mg/dL (ref 8.9–10.3)
Chloride: 104 mmol/L (ref 98–111)
Creatinine, Ser: 0.7 mg/dL (ref 0.44–1.00)
GFR, Estimated: >60 mL/min (ref >60)
Glucose, Bld: 117 mg/dL — ABNORMAL HIGH (ref 70–99)
Potassium: 3.1 mmol/L — ABNORMAL LOW (ref 3.5–5.1)
Sodium: 138 mmol/L (ref 135–145)
Total Bilirubin: 0.8 mg/dL (ref 0.3–1.2)
Total Protein: 8.4 g/dL — ABNORMAL HIGH (ref 6.5–8.1)

## 2021-12-28 LAB — RESP PANEL BY RT-PCR (FLU A&B, COVID) ARPGX2
Influenza A by PCR: NEGATIVE
Influenza B by PCR: NEGATIVE
SARS Coronavirus 2 by RT PCR: NEGATIVE

## 2021-12-28 LAB — ETHANOL: Alcohol, Ethyl (B): <10 mg/dL (ref <10)

## 2021-12-28 LAB — RAPID URINE DRUG SCREEN, HOSP PERFORMED
Amphetamines: NOT DETECTED
Barbiturates: NOT DETECTED
Benzodiazepines: NOT DETECTED
Cocaine: NOT DETECTED
Opiates: NOT DETECTED
Tetrahydrocannabinol: NOT DETECTED

## 2021-12-28 LAB — ACETAMINOPHEN LEVEL: Acetaminophen (Tylenol), Serum: <10 ug/mL — ABNORMAL LOW (ref 10–30)

## 2021-12-28 LAB — SALICYLATE LEVEL: Salicylate Lvl: <7 mg/dL — ABNORMAL LOW (ref 7.0–30.0)

## 2021-12-28 MED ORDER — ATENOLOL 25 MG PO TABS
12.5000 mg | ORAL_TABLET | Freq: Every day | ORAL | Status: DC
  Administered 2021-12-28 – 2021-12-29 (×2): 12.5 mg via ORAL
  Filled 2021-12-28 (×2): qty 0.5

## 2021-12-28 MED ORDER — POTASSIUM CHLORIDE CRYS ER 20 MEQ PO TBCR
40.0000 meq | EXTENDED_RELEASE_TABLET | Freq: Once | ORAL | Status: AC
  Administered 2021-12-28: 10:00:00 40 meq via ORAL
  Filled 2021-12-28: qty 2

## 2021-12-28 MED ORDER — PANTOPRAZOLE SODIUM 40 MG PO TBEC
40.0000 mg | DELAYED_RELEASE_TABLET | Freq: Two times a day (BID) | ORAL | Status: DC
  Administered 2021-12-28 – 2021-12-29 (×2): 40 mg via ORAL
  Filled 2021-12-28: qty 1

## 2021-12-28 NOTE — ED Notes (Signed)
Pt calm and cooperative, polite with staff, no signs of aggression.

## 2021-12-28 NOTE — ED Notes (Addendum)
Pt was informed of [facility]/IVC policy. Pt was changed into scrub top and bottom, all belongings were placed into belonging bags and labeled with pt ID. Placed in 9-12 belongings bin.

## 2021-12-28 NOTE — ED Provider Notes (Signed)
Patient is medically cleared and can be evaluated by behavioral health   Bethann Berkshire, MD 12/28/21 [phone]
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
## 2021-12-28 NOTE — ED Provider Notes (Signed)
[facility] DEPT Provider Note   CSN: [id number] Arrival date & time: 12/28/21  [id number]     History  Chief Complaint  Patient presents with   IVC    Shari [name] is a 66 y.o. female.  Patient has a history of bipolar and schizophrenia and personality disorder.  According to sister she has not been taking care of her self and she is threatening to kill her self and family members so her sister has taken out IVC papers  The history is provided by the patient and the police. No language interpreter was used.  Altered Mental Status Presenting symptoms: behavior changes   Severity:  Severe Most recent episode:  2 days ago Episode history:  Continuous Timing:  Constant Progression:  Worsening Chronicity:  Recurrent Context: not alcohol use   Associated symptoms: no abdominal pain, no hallucinations, no headaches, no rash and no seizures       Home Medications Prior to Admission medications   Medication Sig Start Date End Date Taking? Authorizing Provider  amantadine (SYMMETREL) 100 MG capsule Take 1 capsule (100 mg total) by mouth 2 (two) times daily. Patient not taking: Reported on 11/20/2021 10/26/21 11/25/21  France Ravens, MD  atenolol (TENORMIN) 25 MG tablet Take 0.5 tablets (12.5 mg total) by mouth daily. 10/27/21 11/26/21  France Ravens, MD  atorvastatin (LIPITOR) 40 MG tablet Take 1 tablet (40 mg total) by mouth daily. Patient not taking: Reported on 11/20/2021 10/26/21 11/25/21  France Ravens, MD  divalproex (DEPAKOTE) 500 MG DR tablet Take 1 tablet (500 mg total) by mouth at bedtime. Patient not taking: Reported on 11/20/2021 10/26/21 11/25/21  France Ravens, MD  ibuprofen (ADVIL) 800 MG tablet Take 800 mg by mouth 3 (three) times daily as needed for headache, mild pain or fever. 05/05/21   [provider]  lisinopril (ZESTRIL) 10 MG tablet Take 1 tablet (10 mg total) by mouth daily. Patient not taking: Reported on 11/20/2021 10/26/21 11/25/21   France Ravens, MD  methimazole (TAPAZOLE) 10 MG tablet Take 1 tablet (10 mg total) by mouth daily. 10/26/21 11/25/21  France Ravens, MD  OLANZapine (ZYPREXA) 10 MG tablet Take 1 tablet (10 mg total) by mouth at bedtime. Patient not taking: Reported on 11/20/2021 10/26/21 11/25/21  France Ravens, MD  pantoprazole (PROTONIX) 40 MG tablet Take 1 tablet (40 mg total) by mouth 2 (two) times daily before a meal. 10/26/21 11/25/21  France Ravens, MD  traZODone (DESYREL) 50 MG tablet Take 1 tablet (50 mg total) by mouth at bedtime. Patient not taking: Reported on 11/20/2021 10/26/21 11/25/21  France Ravens, MD      Allergies    Aspirin, Chocolate, Cocoa, Penicillins, and Sulfa antibiotics    Review of Systems   Review of Systems  Constitutional:  Negative for appetite change and fatigue.  HENT:  Negative for congestion, ear discharge and sinus pressure.   Eyes:  Negative for discharge.  Respiratory:  Negative for cough.   Cardiovascular:  Negative for chest pain.  Gastrointestinal:  Negative for abdominal pain and diarrhea.  Genitourinary:  Negative for frequency and hematuria.  Musculoskeletal:  Negative for back pain.  Skin:  Negative for rash.  Neurological:  Negative for seizures and headaches.  Psychiatric/Behavioral:  Negative for hallucinations.    Physical Exam Updated Vital Signs BP (!) 155/98 (BP Location: Right Arm)    Pulse 97    Temp 99.1 F (37.3 C) (Oral)    Resp 16    Ht 5\' 3"  (  1.6 m)    Wt 59 kg    SpO2 99%    BMI 23.03 kg/m  Physical Exam Vitals and nursing note reviewed.  Constitutional:      Appearance: [name] well-developed.  HENT:     Head: Normocephalic.     Nose: Nose normal.  Eyes:     General: No scleral icterus.    Conjunctiva/sclera: Conjunctivae normal.  Neck:     Thyroid: No thyromegaly.  Cardiovascular:     Rate and Rhythm: Normal rate and regular rhythm.     Heart sounds: No murmur heard.   No friction rub. No gallop.  Pulmonary:     Breath sounds: No stridor.  No wheezing or rales.  Chest:     Chest wall: No tenderness.  Abdominal:     General: There is no distension.     Tenderness: There is no abdominal tenderness. There is no rebound.  Musculoskeletal:        General: Normal range of motion.     Cervical back: Neck supple.  Lymphadenopathy:     Cervical: No cervical adenopathy.  Skin:    Findings: No erythema or rash.  Neurological:     Mental Status: [name] alert and oriented to person, place, and time.     Motor: No abnormal muscle tone.     Coordination: Coordination normal.  Psychiatric:     Comments: Patient denies suicidal or homicidal ideations or hearing voices    ED Results / Procedures / Treatments   Labs (all labs ordered are listed, but only abnormal results are displayed) Labs Reviewed  COMPREHENSIVE METABOLIC PANEL - Abnormal; Notable for the following components:      Result Value   Potassium 3.1 (*)    Glucose, Bld 117 (*)    Total Protein 8.4 (*)    Alkaline Phosphatase 131 (*)    All other components within normal limits  SALICYLATE LEVEL - Abnormal; Notable for the following components:   Salicylate Lvl [id number] (*)    All other components within normal limits  ACETAMINOPHEN LEVEL - Abnormal; Notable for the following components:   Acetaminophen (Tylenol), Serum <10 (*)    All other components within normal limits  ETHANOL  CBC  RAPID URINE DRUG SCREEN, HOSP PERFORMED    EKG None  Radiology No results found.  Procedures Procedures    Medications Ordered in ED Medications  potassium chloride SA (KLOR-CON M) CR tablet 40 mEq (has no administration in time range)    ED Course/ Medical Decision Making/ A&P                           Medical Decision Making Amount and/or Complexity of Data Reviewed Labs: ordered.  Risk Prescription drug management.   Patient is medically cleared.  She has IVC papers done by her sister for suicidal and homicidal ideations.  She will be evaluated by behavioral  health for disposition  This patient presents to the ED for concern of suicidal and homicidal ideation, this involves an extensive number of treatment options, and is a complaint that carries with it a high risk of complications and morbidity.  The differential diagnosis includes schizophrenia   Co morbidities that complicate the patient evaluation  Hypertension diabetes, history of schizophrenia, bipolar, personality disorder   Additional history obtained:  Additional history obtained from sister External records from outside source obtained and reviewed including old records   Lab Tests:  I Ordered, and  personally interpreted labs.  The pertinent results include: Basic labs unremarkable except for potassium mildly low   Imaging Studies ordered:  No x-rays ordered  Cardiac Monitoring:  The patient was maintained on a cardiac monitor.  I personally viewed and interpreted the cardiac monitored which showed an underlying rhythm of: Normal sinus rhythm   Medicines ordered and prescription drug management:  No medicines ordered Reevaluation of the patient after these medicines showed that the patient stayed the same I have reviewed the patients home medicines and have made adjustments as needed   Test Considered:  CT head   Critical Interventions:  Consult behavioral health   Consultations Obtained:  I requested consultation with the behavioral health,  and discussed lab and imaging findings as well as pertinent plan - they recommend: Pending   Problem List / ED Course:  Diabetes hypertension schizophrenia   Reevaluation:  After the interventions noted above, I reevaluated the patient and found that they have :stayed the same   Social Determinants of Health:  Psychiatric problem   Dispostion:  After consideration of the diagnostic results and the patients response to treatment, I feel that the patent would benefit from [facility] health evaluation.           Final Clinical Impression(s) / ED Diagnoses Final diagnoses:  None    Rx / DC Orders ED Discharge Orders     None         Milton Ferguson, MD 12/29/21 [phone]

## 2021-12-28 NOTE — ED Notes (Signed)
Pt expressed unable to void at this time, will monitor. 

## 2021-12-28 NOTE — ED Triage Notes (Signed)
Patient brought in ambulatory by [employer], IVCd by the sister that lives with her. Per [employer] pt has been up for days, not bathing or sleeping or eating, talking to herself. 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 Family states pt threatens to kill herself, family members and her dog.  Patient denies any SI/HI to RN during triage. States she does "speak to voices" however that is normal for her.

## 2021-12-28 NOTE — ED Notes (Signed)
Patient will hold medications in hand for awhile before actually taking the med

## 2021-12-28 NOTE — ED Notes (Signed)
Belongings moved to locker 36.

## 2021-12-28 NOTE — BH Assessment (Addendum)
@  1400, requested [place], RN to place the TTS machine in patient's room for her TTS assessment. @1415 , nursing looking for cart.

## 2021-12-29 DIAGNOSIS — F200 Paranoid schizophrenia: Secondary | ICD-10-CM

## 2021-12-29 MED ORDER — OLANZAPINE 10 MG PO TABS: 10.0000 mg | ORAL_TABLET | Freq: Every day | ORAL | 2 refills | Status: DC

## 2021-12-29 MED ORDER — DIVALPROEX SODIUM 500 MG PO DR TAB: 500.0000 mg | DELAYED_RELEASE_TABLET | Freq: Every day | ORAL | 2 refills | Status: DC

## 2021-12-29 MED ORDER — TRAZODONE HCL 50 MG PO TABS: 50.0000 mg | ORAL_TABLET | Freq: Every day | ORAL | 2 refills | Status: DC

## 2021-12-29 NOTE — Consult Note (Signed)
[facility] Psych ED Discharge  Patient Identification: Shari [name] MRN:  [id number] Principal Diagnosis: Paranoid schizophrenia (Shari [name]) Diagnosis:  Principal Problem:   Paranoid schizophrenia (Shari [name]) Active Problems:   Mild cognitive impairment   Total Time spent with patient: 30 minutes  Subjective:   Shari [name] is a 66 y.o. female patient with pmhx for Bipolar, MDD with psychosis, admitted via IVC for not taking care of her ADLs and homicidal ideations.  Patient states today, "I've never threatened to hurt anyone and not myself either. I cant go back to my sisters house. "  Patient seen via face to face by this provider; chart reviewed and consulted with Dr. Dwyane Dee on 12/29/21.  On evaluation [name] reports she's been staying with her sister,"My family always IVC me and then try to get me out there house. Patient doesn't appear to be amendable to housing resources as "I CANNOT GO TO A SHELTER . They usually wake you up in the morning and have you to get out and come back in the afternoon, I am too old for that.  Before my family started taking control and placing me under IVC, ID used to live on my own and I would like to go back to that. " She endorses "bipolar and depression" does not consistently take her medications d/t drowsiness and lingering effects.  States she does not see anyone outpatient for mental health or med mgmt so has not requested dose adjustments.  Patient has been compliant with treatment, medication, and labs since her admission to the emergency room.  All labs appear to be within normal, she is medically cleared and now psychiatrically cleared.  Will place [facility] consult for housing although patient again does not appear to be amenable to such options.  We did discuss temporary options for the time being, until she is able to secure housing.  During evaluation [name] is seated at the edge of hospital bed. [name] alert/oriented x 4; calm/cooperative; and mood congruent  with affect.  Patient is speaking in a clear tone at moderate volume, and normal pace; with good eye contact.  Her thought process is coherent and relevant; There is no indication that she is currently responding to internal/external stimuli or experiencing delusional thought content.  Patient denies suicidal/self-harm/homicidal ideation, psychosis, and paranoia.  Patient has remained calm throughout assessment and has answered questions appropriately.  Her current findings are incongruent with information obtained in IVC, historically this has been the case as patient continues to present to the emergency room with vague complaints and under involuntary commitment.    Past Psychiatric History: as outlined below  Risk to Self:  no Risk to Others:  no Prior Inpatient Therapy:  yes Prior Outpatient Therapy:  unknown  Past Medical History:  Past Medical History:  Diagnosis Date   Eye globe prosthesis    GERD (gastroesophageal reflux disease)    History of blood transfusion 1973   "related to abscess burst in my stomach"   Hyperlipemia    Hyperlipidemia    Hypertension    Osteoarthritis of back    Lowerback    SVD (spontaneous vaginal delivery)    x 1, baby died at 2 wks of age   Type II diabetes mellitus ([place])     Past Surgical History:  Procedure Laterality Date   APPENDECTOMY     COLONOSCOPY     DILATATION & CURETTAGE/HYSTEROSCOPY WITH MYOSURE N/A 02/25/2015   Procedure: DILATATION & CURETTAGE/HYSTEROSCOPY WITH MYOSURE;  Surgeon:  Marylynn Pearson, MD;  Location: [facility];  Service: Gynecology;  Laterality: N/A;   DILATION AND CURETTAGE OF UTERUS     ENUCLEATION Right 03/16/2017   ENUCLEATION Right 03/16/2017   Procedure: ENUCLEATION RIGHT EYE;  Surgeon: Clista Bernhardt, MD;  Location: [place];  Service: Ophthalmology;  Laterality: Right;   EYE SURGERY     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6, 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   EYE SURGERY Right ~ 1974   "S/P initial eye injury; scissors stuck in my eye""    LAPAROSCOPIC CHOLECYSTECTOMY     SHOULDER ARTHROSCOPY WITH ROTATOR CUFF REPAIR Left    wire stitches per patient   SHOULDER ARTHROSCOPY WITH ROTATOR CUFF REPAIR Right    Family History:  Family History  Problem Relation Age of Onset   Diabetes Mother    CAD Mother    Lung cancer Father    Family Psychiatric  History: unknown Social History:  Social History   Substance and Sexual Activity  Alcohol Use Yes   Comment: 03/16/2017 "nothing since 2013"     Social History   Substance and Sexual Activity  Drug Use No    Social History   Socioeconomic History   Marital status: Widowed    Spouse name: Not on file   Number of children: Not on file   Years of education: Not on file   Highest education level: Not on file  Occupational History   Not on file  Tobacco Use   Smoking status: Former    Packs/day: 0.50    Years: 40.00    Pack years: 20.00    Types: Cigarettes    Quit date: 2016    Years since quitting: 7.0   Smokeless tobacco: Never  Vaping Use   Vaping Use: Never used  Substance and Sexual Activity   Alcohol use: Yes    Comment: 03/16/2017 "nothing since 2013"   Drug use: No   Sexual activity: Not Currently    Birth control/protection: Post-menopausal  Other Topics Concern   Not on file  Social History Narrative   Not on file   Social Determinants of Health   Financial Resource Strain: Not on file  Food Insecurity: Not on file  Transportation Needs: Not on file  Physical Activity: Not on file  Stress: Not on file  Social Connections: Not on file   Additional Social History:    Allergies:   Allergies  Allergen Reactions   Aspirin Hives and Itching   Chocolate Hives and Itching   Cocoa Itching and Rash   Penicillins Hives, Itching, Swelling and Other (See Comments)    Has patient had a PCN reaction causing IMMEDIATE RASH, FACIAL/TONGUE/THROAT SWELLING, SOB, OR LIGHTHEADEDNESS WITH HYPOTENSION:  #  #  #  YES  #  #  #  Has patient had a PCN  reaction causing severe rash involving mucus membranes or skin necrosis: No Has patient had a PCN reaction that required hospitalization No Has patient had a PCN reaction occurring within the last 10 years: No If all of the above answers are "NO", then may proceed with Cephalosporin use.    Sulfa Antibiotics Hives and Itching    Labs:  Results for orders placed or performed during the hospital encounter of 12/28/21 (from the past 48 hour(s))  Comprehensive metabolic panel     Status: Abnormal   Collection Time: 12/28/21  8:13 AM  Result Value Ref Range   Sodium 138 135 - 145 mmol/L   Potassium 3.1 (L)  3.5 - 5.1 mmol/L   Chloride 104 98 - 111 mmol/L   CO2 24 22 - 32 mmol/L   Glucose, Bld 117 (H) 70 - 99 mg/dL    Comment: Glucose reference range applies only to samples taken after fasting for at least 8 hours.   BUN 9 8 - 23 mg/dL   Creatinine, Ser 0.70 0.44 - 1.00 mg/dL   Calcium 9.6 8.9 - 10.3 mg/dL   Total Protein 8.4 (H) 6.5 - 8.1 g/dL   Albumin 4.3 3.5 - 5.0 g/dL   AST 18 15 - 41 U/L   ALT 16 0 - 44 U/L   Alkaline Phosphatase 131 (H) 38 - 126 U/L   Total Bilirubin 0.8 0.3 - 1.2 mg/dL   GFR, Estimated >60 >60 mL/min    Comment: (NOTE) Calculated using the CKD-EPI Creatinine Equation (2021)    Anion gap 10 5 - 15    Comment: Performed at [facility], [place] [address], [place], [place] [address]  Ethanol     Status: None   Collection Time: 12/28/21  8:13 AM  Result Value Ref Range   Alcohol, Ethyl (B) <10 <10 mg/dL    Comment: (NOTE) Lowest detectable limit for serum alcohol is 10 mg/dL.  For medical purposes only. Performed at [facility], [place] [address], [place], [place] [id number]   Salicylate level     Status: Abnormal   Collection Time: 12/28/21  8:13 AM  Result Value Ref Range   Salicylate Lvl Q000111Q (L) 7.0 - 30.0 mg/dL    Comment: Performed at [facility], [place] [address], [place], [place] [address]   Acetaminophen level     Status: Abnormal   Collection Time: 12/28/21  8:13 AM  Result Value Ref Range   Acetaminophen (Tylenol), Serum <10 (L) 10 - 30 ug/mL    Comment: (NOTE) Therapeutic concentrations vary significantly. A range of 10-30 ug/mL  may be an effective concentration for many patients. However, some  are best treated at concentrations outside of this range. Acetaminophen concentrations >150 ug/mL at 4 hours after ingestion  and >50 ug/mL at 12 hours after ingestion are often associated with  toxic reactions.  Performed at [facility], [place] [address], [place], [place] [address]   cbc     Status: None   Collection Time: 12/28/21  8:13 AM  Result Value Ref Range   WBC 7.0 4.0 - 10.5 K/uL   RBC 4.62 3.87 - 5.11 MIL/uL   Hemoglobin 12.9 12.0 - 15.0 g/dL   HCT 39.4 36.0 - 46.0 %   MCV 85.3 80.0 - 100.0 fL   MCH 27.9 26.0 - 34.0 pg   MCHC 32.7 30.0 - 36.0 g/dL   RDW 12.3 11.5 - 15.5 %   Platelets 369 150 - 400 K/uL   nRBC 0.0 0.0 - 0.2 %    Comment: Performed at [facility], [place] [address], [place],  [address]  Resp Panel by RT-PCR (Flu A&B, Covid) Nasopharyngeal Swab     Status: None   Collection Time: 12/28/21  2:28 PM   Specimen: Nasopharyngeal Swab; Nasopharyngeal(NP) swabs in vial transport medium  Result Value Ref Range   SARS Coronavirus 2 by RT PCR NEGATIVE NEGATIVE    Comment: (NOTE) SARS-CoV-2 target nucleic acids are NOT DETECTED.  The SARS-CoV-2 RNA is generally detectable in upper respiratory specimens during the acute phase of infection. The lowest concentration of SARS-CoV-2 viral copies this assay can detect  is 138 copies/mL. A negative result does not preclude SARS-Cov-2 infection and should not be used as the sole basis for treatment or other patient management decisions. A negative result may occur with  improper specimen collection/handling, submission of specimen other than nasopharyngeal swab,  presence of viral mutation(s) within the areas targeted by this assay, and inadequate number of viral copies(<138 copies/mL). A negative result must be combined with clinical observations, patient history, and epidemiological information. The expected result is Negative.  Fact Sheet for Patients:  EntrepreneurPulse.com.au  Fact Sheet for Healthcare Providers:  IncredibleEmployment.be  This test is no t yet approved or cleared by the [place] FDA and  has been authorized for detection and/or diagnosis of SARS-CoV-2 by FDA under an Emergency Use Authorization (EUA). This EUA will remain  in effect (meaning this test can be used) for the duration of the COVID-19 declaration under Section 564(b)(1) of the Act, 21 U.S.C.section 360bbb-3(b)(1), unless the authorization is terminated  or revoked sooner.       Influenza A by PCR NEGATIVE NEGATIVE   Influenza B by PCR NEGATIVE NEGATIVE    Comment: (NOTE) The Xpert Xpress SARS-CoV-2/FLU/RSV plus assay is intended as an aid in the diagnosis of influenza from Nasopharyngeal swab specimens and should not be used as a sole basis for treatment. Nasal washings and aspirates are unacceptable for Xpert Xpress SARS-CoV-2/FLU/RSV testing.  Fact Sheet for Patients: EntrepreneurPulse.com.au  Fact Sheet for Healthcare Providers: IncredibleEmployment.be  This test is not yet approved or cleared by the [place] FDA and has been authorized for detection and/or diagnosis of SARS-CoV-2 by FDA under an Emergency Use Authorization (EUA). This EUA will remain in effect (meaning this test can be used) for the duration of the COVID-19 declaration under Section 564(b)(1) of the Act, 21 U.S.C. section 360bbb-3(b)(1), unless the authorization is terminated or revoked.  Performed at [facility], [place] [address], [place], [place] [address]   Rapid urine drug  screen (hospital performed)     Status: None   Collection Time: 12/28/21  3:31 PM  Result Value Ref Range   Opiates NONE DETECTED NONE DETECTED   Cocaine NONE DETECTED NONE DETECTED   Benzodiazepines NONE DETECTED NONE DETECTED   Amphetamines NONE DETECTED NONE DETECTED   Tetrahydrocannabinol NONE DETECTED NONE DETECTED   Barbiturates NONE DETECTED NONE DETECTED    Comment: (NOTE) DRUG SCREEN FOR MEDICAL PURPOSES ONLY.  IF CONFIRMATION IS NEEDED FOR ANY PURPOSE, NOTIFY LAB WITHIN 5 DAYS.  LOWEST DETECTABLE LIMITS FOR URINE DRUG SCREEN Drug Class                     Cutoff (ng/mL) Amphetamine and metabolites    1000 Barbiturate and metabolites    200 Benzodiazepine                 [id number] Tricyclics and metabolites     300 Opiates and metabolites        300 Cocaine and metabolites        300 THC                            50 Performed at [facility], [place] [address] [place], [place] [address]     Medications:  Current Facility-Administered Medications  Medication Dose Route Frequency Provider Last Rate Last Admin   atenolol (TENORMIN) tablet 12.5 mg  12.5 mg Oral Daily Milton Ferguson, MD   12.5 mg at  12/29/21 1022   pantoprazole (PROTONIX) EC tablet 40 mg  40 mg Oral BID Landis Martins, MD   40 mg at 12/29/21 1022   Current Outpatient Medications  Medication Sig Dispense Refill   atenolol (TENORMIN) 25 MG tablet Take 0.5 tablets (12.5 mg total) by mouth daily. 15 tablet 0   ibuprofen (ADVIL) 200 MG tablet Take 800 mg by mouth See admin instructions. Take 800 mg by mouth two to three times a day as needed for pain     lisinopril (ZESTRIL) 10 MG tablet Take 1 tablet (10 mg total) by mouth daily. 30 tablet 0   methimazole (TAPAZOLE) 10 MG tablet Take 1 tablet (10 mg total) by mouth daily. 30 tablet 0   pantoprazole (PROTONIX) 40 MG tablet Take 1 tablet (40 mg total) by mouth 2 (two) times daily before a meal. 60 tablet 0   amantadine (SYMMETREL) 100 MG  capsule Take 1 capsule (100 mg total) by mouth 2 (two) times daily. (Patient not taking: Reported on 12/28/2021) 60 capsule 0   atorvastatin (LIPITOR) 40 MG tablet Take 1 tablet (40 mg total) by mouth daily. (Patient not taking: Reported on 12/28/2021) 30 tablet 0   divalproex (DEPAKOTE) 500 MG DR tablet Take 1 tablet (500 mg total) by mouth at bedtime. (Patient not taking: Reported on 12/28/2021) 30 tablet 0   OLANZapine (ZYPREXA) 10 MG tablet Take 1 tablet (10 mg total) by mouth at bedtime. (Patient not taking: Reported on 12/28/2021) 30 tablet 0   traZODone (DESYREL) 50 MG tablet Take 1 tablet (50 mg total) by mouth at bedtime. (Patient not taking: Reported on 12/28/2021) 30 tablet 0    Musculoskeletal: Strength & Muscle Tone: within normal limits Gait & Station: normal Patient leans: N/A  Psychiatric Specialty Exam:  Presentation  General Appearance: Appropriate for Environment; Casual  Eye Contact:Good  Speech:Clear and Coherent; Normal Rate  Speech Volume:Normal  Handedness:Right   Mood and Affect  Mood:Euthymic  Affect:Appropriate; Congruent   Thought Process  Thought Processes:Coherent; Linear  Descriptions of Associations:Intact  Orientation:Full (Time, Place and Person)  Thought Content:Logical  History of Schizophrenia/Schizoaffective disorder:Yes  Duration of Psychotic Symptoms:Greater than six months  Hallucinations:Hallucinations: None  Ideas of Reference:None  Suicidal Thoughts:Suicidal Thoughts: No  Homicidal Thoughts:Homicidal Thoughts: No   Sensorium  Memory:Immediate Good; Recent Good; Remote Good  Judgment:Good  Insight:Good   Executive Functions  Concentration:Good  Attention Span:Good  [place] of Knowledge:Good  Language:Good   Psychomotor Activity  Psychomotor Activity:Psychomotor Activity: Normal   Assets  Assets:Financial Resources/Insurance; Desire for Improvement; Communication Skills; Leisure Time;  Physical Health; Resilience   Sleep  Sleep:Sleep: Fair    Physical Exam: Physical Exam Constitutional:      Appearance: Normal appearance.  Cardiovascular:     Rate and Rhythm: Normal rate.     Pulses: Normal pulses.  Pulmonary:     Effort: Pulmonary effort is normal.  Musculoskeletal:     Cervical back: Normal range of motion.  Neurological:     General: No focal deficit present.     Mental Status: [name] alert and oriented to person, place, and time.  Psychiatric:        Mood and Affect: Mood normal.        Behavior: Behavior normal.        Thought Content: Thought content normal.        Judgment: Judgment normal.   Review of Systems  Constitutional: Negative.   HENT: Negative.    Eyes: Negative.  Respiratory: Negative.    Cardiovascular: Negative.   Musculoskeletal: Negative.   Skin: Negative.   Neurological: Negative.   Endo/Heme/Allergies: Negative.   Psychiatric/Behavioral: Negative.    Blood pressure 138/77, pulse 78, temperature 98.1 F (36.7 C), temperature source Oral, resp. rate 20, height 5\' 3"  (1.6 m), weight 59 kg, SpO2 100 %. Body mass index is 23.03 kg/m.  Treatment Plan Summary: Per above assessment, there are no grounds for involuntary psychiatric  admission. No positive and negative symptoms of schizophrenia seen today.  Continue current medication, follow-up for psychiatric resources placed in after visit summary and discharge instructions.   Discussed with importance of medication compliance with patient.  Above discussed with patient concordance.  -Will rescind IVC order -TOC consult placed for housing.   Disposition: No evidence of imminent risk to self or others at present.   Patient does not meet criteria for psychiatric inpatient admission. Supportive therapy provided about ongoing stressors. Discussed crisis plan, support from social network, calling 911, coming to the Emergency Department, and calling Suicide Hotline.  Suella Broad, FNP 12/29/2021 11:06 AM

## 2021-12-29 NOTE — BH Assessment (Signed)
[facility] Assessment Progress Note   Per Caryn Bee, NP, this pt does not require psychiatric hospitalization at this time.  Pt presents under IVC initiated by pt's sister and upheld by EDP Bethann Berkshire, MD, which has been rescinded by Nelly Rout, MD.  Pt is psychiatrically cleared.  Discharge instructions include referrals to area outpatient providers.  A TOC consult has been ordered to address pt's psychosocial needs.  EDP Gloris Manchester, MD and pt's nurse, Waynetta Sandy, have been notified.  Doylene Canning, MA Triage Specialist [phone]

## 2021-12-29 NOTE — ED Notes (Signed)
Patient DC d off unit to home per provider. Patient alert, calm, cooperative, no s/s of distress.  Patient  given DC information and belongings. Patient ambulatory off unit, escorted  by NT.  Patient transported by transportation set up by SW.

## 2021-12-29 NOTE — ED Provider Notes (Addendum)
Emergency Medicine Observation Re-evaluation Note  KEEONA LIEBELT is a 66 y.o. female, seen on rounds today.  Pt initially presented to the ED for complaints of IVC Currently, the patient is eating breakfast.  Physical Exam  BP 113/73 (BP Location: Left Arm)    Pulse 64    Temp 98.1 F (36.7 C) (Oral)    Resp 14    Ht 5\' 3"  (1.6 m)    Wt 59 kg    SpO2 98%    BMI 23.03 kg/m  Physical Exam General: Awake, alert Cardiac: Extremities well perfused Lungs: Even and unlabored breathing Psych: No agitation, not actively responding to internal stimuli  ED Course / MDM  EKG:   I have reviewed the labs performed to date as well as medications administered while in observation.  Recent changes in the last 24 hours include: Patient presented yesterday morning after being IVC by her sister.  She has reportedly not been bathing, eating, or sleeping.  She has been responding to internal stimuli and threatening to kill herself, her family members, and her dog.  TTS has evaluated and recommends inpatient hospitalization.  .  Plan  Current plan is for placement. SUHEILY KAUTZ is under involuntary commitment.  Patient was cleared by psychiatry who also rescinded her IVC.  Social work was engaged to Engineer, maintenance (IT).  She was discharged from the ED.      Godfrey Pick, MD 12/29/21 1018    Godfrey Pick, MD 12/29/21 2116

## 2021-12-29 NOTE — Progress Notes (Signed)
Transition of Care [facility]) - Emergency Department Mini Assessment   Patient Details  Name: Shari [name] MRN: [id number] Date of Birth: March 23, 1956  [facility]) CM/SW Contact:    Shari Kass, LCSW Phone Number: 12/29/2021, 11:52 AM   Clinical Narrative:  TOC CSW spoke with pt. at bedside , pt is requesting assistance with buying a house. CSW informed pt. the hospital can only assist with shelter resources and transportation at this time. CSW informed pt. about Psychologist, occupational and Forensic psychologist. Pt stated " I don't want to go through [employer]". Pt stated she was living with her niece prior to her ED visit. CSW asked pt if she would like for this writer to contact her family to see if she can return. Pt declined at first but then stated CSW can contact her sister Shari [name]. CSW attempted to contact pt.'s sister Shari [name] [phone]). CSW informed pt about the [facility] and their day center.   12:15pm  CSW received a call from pt's sister , who stated pt is not allowed back in her daughter home. Pt's sister stated she is at work and is unable to pick pt up. She stated pt is not compliant with medications and OP follow up. Pt's sister stated pt can return back to her old apartment, where another family member currently. Pt has agreed to d/c back to [address], [place]  12:42pm CSW spoke with several family members about pt.'s d/c each member informed this Clinical research associate to contact the next  CSW finally  spoke with pt.'s nice Shari [name]), who currently stay in pt.'s old apartment, to inform her pt will be d/c back to the home. Pt's niece disconnected the call, CSW attempted to call back, no answer and unable to leave a VM. Pt will d/c to her address on file [address] [place], [place]. CSW arranged ride back to pt.'s apartment.   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 [place] [address] Phone: [phone] Fax: [phone]  [facility] ([facility] ) - [place], [place] - [address] [place]  [address] Phone: [phone] Fax: [phone]

## 2021-12-29 NOTE — Discharge Instructions (Addendum)
For your behavioral health needs you are advised to follow up with one of the providers listed below at your earliest opportunity.  Both offer psychiatry and therapy:       [facility]      [address]      [place], [place] [address]      [phone]       New patients are seen at their walk-in clinic.  Walk-in hours are Monday - Friday from 8:30 am - 12:00 pm, and from 1:00 pm - 2:30 pm.  Walk-in patients are seen on a first come, first served basis, so try to arrive as early as possible for the best chance of being seen the same day.       [facility]      [address] [place], [place] [address]      [phone]

## 2022-01-04 ENCOUNTER — Emergency Department (HOSPITAL_COMMUNITY)
Admission: EM | Admit: 2022-01-04 | Discharge: 2022-01-05 | Disposition: A | Discharge status: Nursing Home (Includes ICF) | Payer: Medicare Other | Attending: Emergency Medicine | Admitting: Emergency Medicine

## 2022-01-04 DIAGNOSIS — Z79899 Other long term (current) drug therapy: Secondary | ICD-10-CM | POA: Diagnosis not present

## 2022-01-04 DIAGNOSIS — E119 Type 2 diabetes mellitus without complications: Secondary | ICD-10-CM | POA: Insufficient documentation

## 2022-01-04 DIAGNOSIS — I10 Essential (primary) hypertension: Secondary | ICD-10-CM | POA: Diagnosis not present

## 2022-01-04 DIAGNOSIS — Z20822 Contact with and (suspected) exposure to covid-19: Secondary | ICD-10-CM | POA: Insufficient documentation

## 2022-01-04 DIAGNOSIS — R45850 Homicidal ideations: Secondary | ICD-10-CM | POA: Insufficient documentation

## 2022-01-04 DIAGNOSIS — Z046 Encounter for general psychiatric examination, requested by authority: Secondary | ICD-10-CM | POA: Diagnosis present

## 2022-01-04 DIAGNOSIS — R45851 Suicidal ideations: Secondary | ICD-10-CM | POA: Insufficient documentation

## 2022-01-04 DIAGNOSIS — F29 Unspecified psychosis not due to a substance or known physiological condition: Secondary | ICD-10-CM | POA: Diagnosis not present

## 2022-01-04 DIAGNOSIS — F200 Paranoid schizophrenia: Secondary | ICD-10-CM | POA: Diagnosis not present

## 2022-01-04 DIAGNOSIS — F0390 Unspecified dementia without behavioral disturbance: Secondary | ICD-10-CM | POA: Insufficient documentation

## 2022-01-04 LAB — COMPREHENSIVE METABOLIC PANEL
ALT: 20 U/L (ref 0–44)
AST: 20 U/L (ref 15–41)
Albumin: 4 g/dL (ref 3.5–5.0)
Alkaline Phosphatase: 104 U/L (ref 38–126)
Anion gap: 12 (ref 5–15)
BUN: 13 mg/dL (ref 8–23)
CO2: 24 mmol/L (ref 22–32)
Calcium: 10.1 mg/dL (ref 8.9–10.3)
Chloride: 103 mmol/L (ref 98–111)
Creatinine, Ser: 0.95 mg/dL (ref 0.44–1.00)
GFR, Estimated: >60 mL/min (ref >60)
Glucose, Bld: 134 mg/dL — ABNORMAL HIGH (ref 70–99)
Potassium: 3.4 mmol/L — ABNORMAL LOW (ref 3.5–5.1)
Sodium: 139 mmol/L (ref 135–145)
Total Bilirubin: 1.1 mg/dL (ref 0.3–1.2)
Total Protein: 7.8 g/dL (ref 6.5–8.1)

## 2022-01-04 LAB — CBC WITH DIFFERENTIAL/PLATELET
Abs Immature Granulocytes: 0 10*3/uL (ref 0.00–0.07)
Basophils Absolute: 0 10*3/uL (ref 0.0–0.1)
Basophils Relative: 0 %
Eosinophils Absolute: 0 10*3/uL (ref 0.0–0.5)
Eosinophils Relative: 0 %
HCT: 41.6 % (ref 36.0–46.0)
Hemoglobin: 13.8 g/dL (ref 12.0–15.0)
Immature Granulocytes: 0 %
Lymphocytes Relative: 39 %
Lymphs Abs: 1.9 10*3/uL (ref 0.7–4.0)
MCH: 28.3 pg (ref 26.0–34.0)
MCHC: 33.2 g/dL (ref 30.0–36.0)
MCV: 85.4 fL (ref 80.0–100.0)
Monocytes Absolute: 0.3 10*3/uL (ref 0.1–1.0)
Monocytes Relative: 6 %
Neutro Abs: 2.6 10*3/uL (ref 1.7–7.7)
Neutrophils Relative %: 55 %
Platelets: 347 10*3/uL (ref 150–400)
RBC: 4.87 MIL/uL (ref 3.87–5.11)
RDW: 12.3 % (ref 11.5–15.5)
WBC: 4.9 10*3/uL (ref 4.0–10.5)
nRBC: 0 % (ref 0.0–0.2)

## 2022-01-04 LAB — RESP PANEL BY RT-PCR (FLU A&B, COVID) ARPGX2
Influenza A by PCR: NEGATIVE
Influenza B by PCR: NEGATIVE
SARS Coronavirus 2 by RT PCR: NEGATIVE

## 2022-01-04 LAB — ETHANOL: Alcohol, Ethyl (B): <10 mg/dL (ref <10)

## 2022-01-04 NOTE — BH Assessment (Addendum)
@  2152, contacted [facility] Nursing Secretary (Deloris) @ [phone], requested to be transferred to patient's nurse Lindie Spruce, RN) to request the TTS machine to be moved to patient's room for the initial TTS assessment. Clinician spoke to [facility], NP, whom stated that patient has not been roomed yet. She asked to check back in 15 minutes or so, as they are completing patient's triage, and seeking a room for her to complete the TTS assessment.   Clinician will try and complete another patient's TTS assessment for now. Will follow back up if time permits for this Clinician to see patient during this Clinicians shift.

## 2022-01-04 NOTE — ED Triage Notes (Signed)
Pt calm/cooperative in triage, no physical complaints.
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
## 2022-01-04 NOTE — BH Assessment (Addendum)
@  2149, requested patient's nurse Aundra Millet, RN) via secure chat to set up the TTS machine for this Clinician to complete patient's initial assessment.
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
## 2022-01-05 ENCOUNTER — Encounter: Payer: Self-pay | Admitting: Psychiatry

## 2022-01-05 ENCOUNTER — Other Ambulatory Visit: Payer: Self-pay

## 2022-01-05 ENCOUNTER — Inpatient Hospital Stay
Admission: AD | Admit: 2022-01-05 | Discharge: 2022-01-18 | DRG: 885 | Disposition: A | Discharge status: Home / Self Care | Payer: Medicare Other | Source: Intra-hospital | Attending: Psychiatry | Admitting: Psychiatry

## 2022-01-05 DIAGNOSIS — R45851 Suicidal ideations: Secondary | ICD-10-CM | POA: Diagnosis present

## 2022-01-05 DIAGNOSIS — Z9114 Patient's other noncompliance with medication regimen: Secondary | ICD-10-CM | POA: Diagnosis not present

## 2022-01-05 DIAGNOSIS — Z880 Allergy status to penicillin: Secondary | ICD-10-CM | POA: Diagnosis not present

## 2022-01-05 DIAGNOSIS — F209 Schizophrenia, unspecified: Secondary | ICD-10-CM | POA: Diagnosis present

## 2022-01-05 DIAGNOSIS — I10 Essential (primary) hypertension: Secondary | ICD-10-CM | POA: Diagnosis present

## 2022-01-05 DIAGNOSIS — F250 Schizoaffective disorder, bipolar type: Secondary | ICD-10-CM | POA: Diagnosis present

## 2022-01-05 DIAGNOSIS — Z87891 Personal history of nicotine dependence: Secondary | ICD-10-CM

## 2022-01-05 DIAGNOSIS — Z970 Presence of artificial eye: Secondary | ICD-10-CM | POA: Diagnosis not present

## 2022-01-05 DIAGNOSIS — K219 Gastro-esophageal reflux disease without esophagitis: Secondary | ICD-10-CM | POA: Diagnosis present

## 2022-01-05 DIAGNOSIS — Z79899 Other long term (current) drug therapy: Secondary | ICD-10-CM

## 2022-01-05 LAB — URINE DRUG SCREEN, QUALITATIVE (ARMC ONLY)
Amphetamines, Ur Screen: NOT DETECTED
Barbiturates, Ur Screen: NOT DETECTED
Benzodiazepine, Ur Scrn: NOT DETECTED
Cannabinoid 50 Ng, Ur ~~LOC~~: NOT DETECTED
Cocaine Metabolite,Ur ~~LOC~~: NOT DETECTED
MDMA (Ecstasy)Ur Screen: NOT DETECTED
Methadone Scn, Ur: NOT DETECTED
Opiate, Ur Screen: NOT DETECTED
Phencyclidine (PCP) Ur S: NOT DETECTED
Tricyclic, Ur Screen: NOT DETECTED

## 2022-01-05 MED ORDER — IBUPROFEN 800 MG PO TABS
800.0000 mg | ORAL_TABLET | Freq: Three times a day (TID) | ORAL | Status: DC | PRN
  Administered 2022-01-07 – 2022-01-18 (×12): 800 mg via ORAL
  Filled 2022-01-05 (×13): qty 1

## 2022-01-05 MED ORDER — ATENOLOL 25 MG PO TABS
12.5000 mg | ORAL_TABLET | Freq: Every day | ORAL | Status: DC
  Administered 2022-01-05 – 2022-01-18 (×14): 12.5 mg via ORAL
  Filled 2022-01-05 (×14): qty 0.5

## 2022-01-05 MED ORDER — DIVALPROEX SODIUM 250 MG PO DR TAB
500.0000 mg | DELAYED_RELEASE_TABLET | Freq: Every day | ORAL | Status: DC
  Administered 2022-01-06 – 2022-01-17 (×12): 500 mg via ORAL
  Filled 2022-01-05 (×13): qty 2

## 2022-01-05 MED ORDER — LISINOPRIL 20 MG PO TABS
10.0000 mg | ORAL_TABLET | Freq: Every day | ORAL | Status: DC
  Administered 2022-01-05 – 2022-01-18 (×14): 10 mg via ORAL
  Filled 2022-01-05 (×14): qty 1

## 2022-01-05 MED ORDER — NICOTINE 21 MG/24HR TD PT24
21.0000 mg | MEDICATED_PATCH | Freq: Every day | TRANSDERMAL | Status: DC
  Filled 2022-01-05 (×4): qty 1

## 2022-01-05 MED ORDER — PANTOPRAZOLE SODIUM 40 MG PO TBEC
40.0000 mg | DELAYED_RELEASE_TABLET | Freq: Two times a day (BID) | ORAL | Status: DC
  Administered 2022-01-05 – 2022-01-18 (×26): 40 mg via ORAL
  Filled 2022-01-05 (×26): qty 1

## 2022-01-05 MED ORDER — ATORVASTATIN CALCIUM 10 MG PO TABS
40.0000 mg | ORAL_TABLET | Freq: Every day | ORAL | Status: DC
  Administered 2022-01-05 – 2022-01-18 (×14): 40 mg via ORAL
  Filled 2022-01-05 (×14): qty 4

## 2022-01-05 MED ORDER — HYDROXYZINE HCL 25 MG PO TABS: 50.0000 mg | ORAL_TABLET | Freq: Four times a day (QID) | ORAL | Status: DC | PRN

## 2022-01-05 MED ORDER — MAGNESIUM HYDROXIDE 400 MG/5ML PO SUSP
30.0000 mL | Freq: Every day | ORAL | Status: DC | PRN
Start: 2022-01-05 — End: 2022-01-18

## 2022-01-05 MED ORDER — ALUM & MAG HYDROXIDE-SIMETH 200-200-20 MG/5ML PO SUSP: 30.0000 mL | ORAL | Status: DC | PRN

## 2022-01-05 MED ORDER — OLANZAPINE 5 MG PO TABS
10.0000 mg | ORAL_TABLET | Freq: Every day | ORAL | Status: DC
  Administered 2022-01-06 – 2022-01-17 (×12): 10 mg via ORAL
  Filled 2022-01-05 (×14): qty 2

## 2022-01-05 MED ORDER — ACETAMINOPHEN 325 MG PO TABS
650.0000 mg | ORAL_TABLET | Freq: Four times a day (QID) | ORAL | Status: DC | PRN
  Administered 2022-01-05 – 2022-01-11 (×3): 650 mg via ORAL
  Filled 2022-01-05 (×4): qty 2

## 2022-01-05 MED ORDER — TRAZODONE HCL 50 MG PO TABS
50.0000 mg | ORAL_TABLET | Freq: Every day | ORAL | Status: DC
  Administered 2022-01-07: 50 mg via ORAL
  Filled 2022-01-05 (×5): qty 1

## 2022-01-05 NOTE — Progress Notes (Signed)
Patient gave staff urine sample. Liquid was very clear and the outside of the cup was wet. Sample was sent to lab.
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
## 2022-01-05 NOTE — ED Notes (Signed)
Sherriffs office informed and will call when they are on the way

## 2022-01-05 NOTE — Progress Notes (Signed)
[facility] LCSW Progress Note   01/05/2022    10:46 AM  Shari [name]   [id number]   Type of Contact and Topic:  Psychiatric Bed Placement   Pt accepted to [facility] L[facility]27    Patient meets inpatient criteria per Ophelia Shoulder, NP   The attending provider will be Dr. Toni Amend  Call report to [phone]   Marvetta Gibbons, RN @ [facility] ED notified.     Pt scheduled  to arrive at [facility] today. Please fax IVC paperwork to [phone].   Damita Dunnings, MSW, LCSW-A  10:47 AM 01/05/2022

## 2022-01-05 NOTE — BH Assessment (Signed)
Patient has been accepted to [facility] Accepting physician is Dr. Weber Cooks.  Attending  Physician will be Dr. Louis Meckel.  Patient has been assigned to room L27, by [place].   Call report to [phone].  Representative/Transfer Coordinator is Dispensing optician Patient pre-admitted by [facility] Patient Access Seth Bake).

## 2022-01-05 NOTE — BH Assessment (Signed)
@  [phone], sent a follow up secure chat to patient's nurse [facility], [place]) see if patient is available to be seen by TTS.
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
## 2022-01-05 NOTE — BH Assessment (Signed)
@  2956, requested patient's nurse ([place], RN) via secure chat to set up the TTS machine for patient to complete her initial TTS assessment.

## 2022-01-05 NOTE — ED Notes (Signed)
Belonging received from security.  Will have the pt. Sign when sherriff arrives for transportation
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
## 2022-01-05 NOTE — Tx Team (Signed)
Initial Treatment Plan 01/05/2022 4:31 PM [name] QKM:[id number]    PATIENT STRESSORS: Marital or family conflict     PATIENT STRENGTHS: Forensic psychologist fund of knowledge  Physical Health    PATIENT IDENTIFIED PROBLEMS: Her Family                     DISCHARGE CRITERIA:  Ability to meet basic life and health needs Improved stabilization in mood, thinking, and/or behavior Motivation to continue treatment in a less acute level of care Verbal commitment to aftercare and medication compliance  PRELIMINARY DISCHARGE PLAN: Outpatient therapy Return to previous living arrangement  PATIENT/FAMILY INVOLVEMENT: This treatment plan has been presented to and reviewed with the patient, Shari [name] .  The patient and family have been given the opportunity to ask questions and make suggestions.  Jasper Riling, RN 01/05/2022, 4:31 PM

## 2022-01-05 NOTE — ED Notes (Signed)
TTS is in front of pt. Waiting for provider

## 2022-01-06 DIAGNOSIS — F250 Schizoaffective disorder, bipolar type: Secondary | ICD-10-CM | POA: Diagnosis not present

## 2022-01-06 LAB — LIPID PANEL
Cholesterol: 201 mg/dL — ABNORMAL HIGH (ref 0–200)
HDL: 46 mg/dL (ref >40)
LDL Cholesterol: 146 mg/dL — ABNORMAL HIGH (ref 0–99)
Total CHOL/HDL Ratio: 4.4 RATIO
Triglycerides: 46 mg/dL (ref <150)
VLDL: 9 mg/dL (ref 0–40)

## 2022-01-06 NOTE — BH IP Treatment Plan (Signed)
Interdisciplinary Treatment and Diagnostic Plan Update  01/06/2022 Time of Session: 10:00AM Shari [name] MRN: [id number]  Principal Diagnosis: Schizoaffective disorder, bipolar type [facility])  Secondary Diagnoses: Principal Problem:   Schizoaffective disorder, bipolar type ([place]) Active Problems:   Schizophrenia ([place])   Current Medications:  Current Facility-Administered Medications  Medication Dose Route Frequency Provider Last Rate Last Admin   acetaminophen (TYLENOL) tablet 650 mg  650 mg Oral Q6H PRN Clapacs, Madie Reno, MD   650 mg at 01/05/22 2105   alum & mag hydroxide-simeth (MAALOX/MYLANTA) 200-200-20 MG/5ML suspension 30 mL  30 mL Oral Q4H PRN Clapacs, Madie Reno, MD       atenolol (TENORMIN) tablet 12.5 mg  12.5 mg Oral Daily Clapacs, John T, MD   12.5 mg at 01/05/22 1543   atorvastatin (LIPITOR) tablet 40 mg  40 mg Oral Daily Clapacs, Madie Reno, MD   40 mg at 01/05/22 1542   divalproex (DEPAKOTE) DR tablet 500 mg  500 mg Oral QHS Clapacs, Madie Reno, MD       hydrOXYzine (ATARAX) tablet 50 mg  50 mg Oral Q6H PRN Clapacs, Madie Reno, MD       ibuprofen (ADVIL) tablet 800 mg  800 mg Oral TID PRN Clapacs, Madie Reno, MD       lisinopril (ZESTRIL) tablet 10 mg  10 mg Oral Daily Clapacs, Madie Reno, MD   10 mg at 01/05/22 1542   magnesium hydroxide (MILK OF MAGNESIA) suspension 30 mL  30 mL Oral Daily PRN Clapacs, Madie Reno, MD       nicotine (NICODERM CQ - dosed in mg/24 hours) patch 21 mg  21 mg Transdermal Q0600 Clapacs, Madie Reno, MD       OLANZapine (ZYPREXA) tablet 10 mg  10 mg Oral QHS Clapacs, John T, MD       pantoprazole (PROTONIX) EC tablet 40 mg  40 mg Oral BID AC Clapacs, Madie Reno, MD   40 mg at 01/06/22 0923   traZODone (DESYREL) tablet 50 mg  50 mg Oral QHS Clapacs, Madie Reno, MD       PTA Medications: Medications Prior to Admission  Medication Sig Dispense Refill Last Dose   amantadine (SYMMETREL) 100 MG capsule Take 1 capsule (100 mg total) by mouth 2 (two) times daily. (Patient not taking: Reported on  12/28/2021) 60 capsule 0    atenolol (TENORMIN) 25 MG tablet Take 0.5 tablets (12.5 mg total) by mouth daily. 15 tablet 0    atorvastatin (LIPITOR) 40 MG tablet Take 1 tablet (40 mg total) by mouth daily. 30 tablet 0    divalproex (DEPAKOTE) 500 MG DR tablet Take 1 tablet (500 mg total) by mouth at bedtime. (Patient not taking: Reported on 01/04/2022) 30 tablet 2    ibuprofen (ADVIL) 200 MG tablet Take 800 mg by mouth See admin instructions. Take 800 mg by mouth two to three times a day as needed for pain      lisinopril (ZESTRIL) 10 MG tablet Take 1 tablet (10 mg total) by mouth daily. 30 tablet 0    methimazole (TAPAZOLE) 10 MG tablet Take 1 tablet (10 mg total) by mouth daily. 30 tablet 0    OLANZapine (ZYPREXA) 10 MG tablet Take 1 tablet (10 mg total) by mouth at bedtime. (Patient not taking: Reported on 01/04/2022) 30 tablet 2    pantoprazole (PROTONIX) 40 MG tablet Take 1 tablet (40 mg total) by mouth 2 (two) times daily before a meal. 60 tablet 0    traZODone (  DESYREL) 50 MG tablet Take 1 tablet (50 mg total) by mouth at bedtime. (Patient not taking: Reported on 01/04/2022) 30 tablet 2     Patient Stressors: Marital or family conflict    Patient Strengths: Marketing executive fund of knowledge  Physical Health   Treatment Modalities: Medication Management, Group therapy, Case management,  1 to 1 session with clinician, Psychoeducation, Recreational therapy.   Physician Treatment Plan for Primary Diagnosis: Schizoaffective disorder, bipolar type ([place]) Long Term Goal(s):     Short Term Goals:    Medication Management: Evaluate patient's response, side effects, and tolerance of medication regimen.  Therapeutic Interventions: 1 to 1 sessions, Unit Group sessions and Medication administration.  Evaluation of Outcomes: Not Met  Physician Treatment Plan for Secondary Diagnosis: Principal Problem:   Schizoaffective disorder, bipolar type ([employer]) Active Problems:   Schizophrenia  ([place])  Long Term Goal(s):     Short Term Goals:       Medication Management: Evaluate patient's response, side effects, and tolerance of medication regimen.  Therapeutic Interventions: 1 to 1 sessions, Unit Group sessions and Medication administration.  Evaluation of Outcomes: Not Met   RN Treatment Plan for Primary Diagnosis: Schizoaffective disorder, bipolar type ([place]) Long Term Goal(s): Knowledge of disease and therapeutic regimen to maintain health will improve  Short Term Goals: Ability to remain free from injury will improve, Ability to demonstrate self-control, Ability to participate in decision making will improve, Ability to verbalize feelings will improve, Ability to identify and develop effective coping behaviors will improve, and Compliance with prescribed medications will improve  Medication Management: RN will administer medications as ordered by provider, will assess and evaluate patient's response and provide education to patient for prescribed medication. RN will report any adverse and/or side effects to prescribing provider.  Therapeutic Interventions: 1 on 1 counseling sessions, Psychoeducation, Medication administration, Evaluate responses to treatment, Monitor vital signs and CBGs as ordered, Perform/monitor CIWA, COWS, AIMS and Fall Risk screenings as ordered, Perform wound care treatments as ordered.  Evaluation of Outcomes: Not Met   LCSW Treatment Plan for Primary Diagnosis: Schizoaffective disorder, bipolar type ([place]) Long Term Goal(s): Safe transition to appropriate next level of care at discharge, Engage patient in therapeutic group addressing interpersonal concerns.  Short Term Goals: Engage patient in aftercare planning with referrals and resources, Increase social support, Increase ability to appropriately verbalize feelings, Increase emotional regulation, Facilitate acceptance of mental health diagnosis and concerns, Identify triggers associated with mental  health/substance abuse issues, and Increase skills for wellness and recovery  Therapeutic Interventions: Assess for all discharge needs, 1 to 1 time with Social worker, Explore available resources and support systems, Assess for adequacy in community support network, Educate family and significant other(s) on suicide prevention, Complete Psychosocial Assessment, Interpersonal group therapy.  Evaluation of Outcomes: Not Met   Progress in Treatment: Attending groups: No. Participating in groups: No. Taking medication as prescribed: No. Toleration medication: Yes. Family/Significant other contact made: No, will contact:  when given permission Patient understands diagnosis: No. Discussing patient identified problems/goals with staff: No. Medical problems stabilized or resolved: Yes. Denies suicidal/homicidal ideation: Yes. Issues/concerns per patient self-inventory: No. Other: None  New problem(s) identified: No, Describe:  None  New Short Term/Long Term Goal(s): Patient to work towards dedication management for mood stabilization;  development of comprehensive mental wellness/sobriety plan.   Patient Goals:  "don't know what, if I find anything to work on, I'll do that"  Discharge Plan or Barriers: CSW will assist pt with development of  an appropriate discharge/aftercare plan.   Reason for Continuation of Hospitalization: Medication stabilization  Estimated Length of Stay: 1-7 days   Scribe for Treatment Team: Mylissa Lambe A [place], Latanya Presser 01/06/2022 10:16 AM

## 2022-01-06 NOTE — Group Note (Unsigned)
LCSW Group Therapy Note  Group Date: 01/06/2022 Start Time: 1300 End Time: 1400   Type of Therapy and Topic:  Group Therapy - How To Cope with Nervousness about Discharge   Participation Level:  {BHH PARTICIPATION [id number]}   Description of Group This process group involved identification of patients' feelings about discharge. Some of them are scheduled to be discharged soon, while others are new admissions, but each of them was asked to share thoughts and feelings surrounding discharge from the hospital. One common theme was that they are excited at the prospect of going home, while another was that many of them are apprehensive about sharing why they were hospitalized. Patients were given the opportunity to discuss these feelings with their peers in preparation for discharge.  Therapeutic Goals  1. Patient will identify their overall feelings about pending discharge. 2. Patient will think about how they might proactively address issues that they believe will once again arise once they get home (i.e. with parents). 3. Patients will participate in discussion about having hope for change.   Summary of Patient Progress:  *** was very active throughout the session. *** demonstrated *** insight into the subject matter, and proved open to input from peers and feedback from CSW. *** was respectful of peers and participated throughout the entire session.   Therapeutic Modalities Cognitive Behavioral Therapy   Almedia Balls 01/06/2022  1:39 PM

## 2022-01-06 NOTE — Progress Notes (Signed)
Recreation Therapy Notes  INPATIENT RECREATION THERAPY ASSESSMENT  Patient Details Name: ARRIETTY DERCOLE MRN: [id number] DOB: 09-21-1956 Today's Date: 01/06/2022       Information Obtained From: Patient  Able to Participate in Assessment/Interview: Yes  Patient Presentation: Responsive  Reason for Admission (Per Patient): Patient Unable to Identify  Patient Stressors: Family  Coping Skills:   TV, Music  Leisure Interests (2+):  Individual - TV, Games - Cards, Music - Listen (Cooking)  Frequency of Recreation/Participation: Weekly  Awareness of Community Resources:  No  Community Resources:     Current Use:    If no, Barriers?:    Expressed Interest in [employer] Information: No  [employer] of Residence:  [place]  Patient Main Form of Transportation: Therapist, music  Patient Strengths:  Good personality  Patient Identified Areas of Improvement:  I don't know.  Patient Goal for Hospitalization:  I don't know.  Current SI (including self-harm):  No  Current HI:  No  Current AVH: No  Staff Intervention Plan: Group Attendance, Collaborate with Interdisciplinary Treatment Team  Consent to Intern Participation: N/A  Jaylan Hinojosa 01/06/2022, 2:14 PM

## 2022-01-06 NOTE — Progress Notes (Signed)
Recreation Therapy Notes    Date: 01/06/2022  Time: 1:30pm    Location: Craft room    Behavioral response: N/A   Intervention Topic: Values   Discussion/Intervention: Patient refused to attend group.   Clinical Observations/Feedback:  Patient refused to attend group.   Johnna Bollier LRT/CTRS          Zylah Elsbernd 01/06/2022 2:08 PM
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
## 2022-01-06 NOTE — Progress Notes (Signed)
Recreation Therapy Notes  INPATIENT RECREATION TR PLAN  Patient Details Name: Shari [name] MRN: [id number] DOB: 1956/10/31 Today's Date: 01/06/2022  Rec Therapy Plan Is patient appropriate for Therapeutic Recreation?: Yes Treatment times per week: at least 3 Estimated Length of Stay: 5-7 days TR Treatment/Interventions: Group participation (Comment)  Discharge Criteria Pt will be discharged from therapy if:: Discharged Treatment plan/goals/alternatives discussed and agreed upon by:: Patient/family  Discharge Summary     Thadius Smisek 01/06/2022, 2:15 PM

## 2022-01-06 NOTE — BHH Suicide Risk Assessment (Signed)
[facility] Admission Suicide Risk Assessment   Nursing information obtained from:  Patient Demographic factors:  Age 66 or older Current Mental Status:  NA Loss Factors:  NA Historical Factors:  NA Risk Reduction Factors:  NA  Total Time spent with patient: 1 hour Principal Problem: Schizoaffective disorder, bipolar type ([facility]) Diagnosis:  Principal Problem:   Schizoaffective disorder, bipolar type ([facility]) Active Problems:   Schizophrenia ([facility])  Subjective Data: Patient brought in ambulatory by [facility], IVCd by the sister that lives with her. Per [facility] pt has been up for days, not bathing or sleeping or eating, talking to herself. 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 Family states pt threatens to kill herself, family members and her dog.  Patient denies any SI/HI to RN during triage. States she does "speak to voices" however that is normal for her.   Continued Clinical Symptoms:  Alcohol Use Disorder Identification Test Final Score (AUDIT): 0 The "Alcohol Use Disorders Identification Test", Guidelines for Use in Primary Care, Second Edition.  World Science writer [facility]). Score between 0-7:  no or low risk or alcohol related problems. Score between 8-15:  moderate risk of alcohol related problems. Score between 16-19:  high risk of alcohol related problems. Score 20 or above:  warrants further diagnostic evaluation for alcohol dependence and treatment.   CLINICAL FACTORS:   Bipolar Disorder:   Mixed State Currently Psychotic   Musculoskeletal: Strength & Muscle Tone: within normal limits Gait & Station: normal Patient leans: N/A  Psychiatric Specialty Exam:  Presentation  General Appearance: Appropriate for Environment; Casual  Eye Contact:Good  Speech:Clear and Coherent; Normal Rate  Speech Volume:Normal  Handedness:Right   Mood and Affect  Mood:Euthymic  Affect:Appropriate; Congruent   Thought Process  Thought Processes:Coherent;  Linear  Descriptions of Associations:Intact  Orientation:Full (Time, Place and Person)  Thought Content:Logical  History of Schizophrenia/Schizoaffective disorder:Yes  Duration of Psychotic Symptoms:Greater than six months  Hallucinations:No data recorded Ideas of Reference:None  Suicidal Thoughts:No data recorded Homicidal Thoughts:No data recorded  Sensorium  Memory:Immediate Good; Recent Good; Remote Good  Judgment:Good  Insight:Good   Executive Functions  Concentration:Good  Attention Span:Good  Recall:Good  Fund of Knowledge:Good  Language:Good   Psychomotor Activity  Psychomotor Activity:No data recorded  Assets  Assets:Financial Resources/Insurance; Desire for Improvement; Communication Skills; Leisure Time; Physical Health; Resilience   Sleep  Sleep:No data recorded   Physical Exam: Physical Exam Vitals and nursing note reviewed.  Constitutional:      Appearance: Normal appearance. [name] normal weight.  Neurological:     General: No focal deficit present.     Mental Status: [name] alert and oriented to person, place, and time.  Psychiatric:        Attention and Perception: Attention normal. She perceives auditory hallucinations.        Mood and Affect: Mood normal. Affect is flat.        Speech: Speech normal.        Behavior: Behavior normal. Behavior is cooperative.        Thought Content: Thought content is paranoid.        Cognition and Memory: Cognition and memory normal.        Judgment: Judgment is inappropriate.   Review of Systems  Constitutional: Negative.   HENT: Negative.    Eyes: Negative.   Respiratory: Negative.    Cardiovascular: Negative.   Gastrointestinal: Negative.   Genitourinary: Negative.   Musculoskeletal: Negative.   Skin: Negative.   Neurological: Negative.  Endo/Heme/Allergies: Negative.   Psychiatric/Behavioral:  Positive for hallucinations. The patient has insomnia.   Blood pressure (!) 143/72, pulse  75, temperature 98.1 F (36.7 C), temperature source Oral, resp. rate 18, height 5\' 4"  (1.626 m), weight 53.5 kg, SpO2 (!) 84 %. Body mass index is 20.25 kg/m.   COGNITIVE FEATURES THAT CONTRIBUTE TO RISK:  Thought constriction (tunnel vision)    SUICIDE RISK:   Minimal: No identifiable suicidal ideation.  Patients presenting with no risk factors but with morbid ruminations; may be classified as minimal risk based on the severity of the depressive symptoms  PLAN OF CARE: See Orders  I certify that inpatient services furnished can reasonably be expected to improve the patient's condition.   , DO 01/06/2022, 10:21 AM

## 2022-01-06 NOTE — H&P (Signed)
Psychiatric Admission Assessment Adult  Patient Identification: Shari [name] MRN:  [id number] Date of Evaluation:  01/06/2022 Chief Complaint:  Schizophrenia (HCC) [F20.9] Principal Diagnosis: Schizoaffective disorder, bipolar type (HCC) Diagnosis:  Principal Problem:   Schizoaffective disorder, bipolar type (HCC) Active Problems:   Schizophrenia (HCC)  History of Present Illness:  Shari RhodesBetty is a 66 year old African-American female who was involuntarily committed to the geriatric psychiatry on transfer from [facility].  Shari RhodesBetty has a long history of paranoid schizophrenia and was last admitted to [facility] back in November 2022.  She was discharged on Zyprexa 10 mg at bedtime and Depakote 500 mg at bedtime and trazodone at bedtime.  She says that she is living alone but then states that she lives with her niece.  [name] a very poor historian and somewhat guarded.  She does state that people are out to get her and want to shoot her.  This seems to be a common theme in her admissions.  She states that she has not been taking her medications and the chart states that the niece says that she is not taking care of herself and not bathing or eating.  She does look thin and probably lost some weight.  She denies being suicidal.  She denies depression but her affect is very flat.   PER INITIAL INTAKE: Patient brought in ambulatory by [employer], IVCd by the sister that lives with her. Per GPD pt has been up for days, not bathing or sleeping or eating, talking to herself. 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 Family states pt threatens to kill herself, family members and her dog.  Patient denies any SI/HI to RN during triage. States she does "speak to voices" however that is normal for her.   Associated Signs/Symptoms: Depression Symptoms:  insomnia, Duration of Depression Symptoms: N/A  (Hypo) Manic Symptoms:  Hallucinations, Irritable Mood, Anxiety Symptoms:  Excessive  Worry, Psychotic Symptoms:  Hallucinations: Auditory Paranoia, PTSD Symptoms: NA Total Time spent with patient: 1 hour  Past Psychiatric History: [place], [place], Paranoid Schizophrenia  Is the patient at risk to self? Yes.    Has the patient been a risk to self in the past 6 months? Yes.    Has the patient been a risk to self within the distant past? Yes.    Is the patient a risk to others? No.  Has the patient been a risk to others in the past 6 months? No.  Has the patient been a risk to others within the distant past? No.   Prior Inpatient Therapy:   Prior Outpatient Therapy:    Alcohol Screening: Patient refused Alcohol Screening Tool:  (N/A) 1. How often do you have a drink containing alcohol?: Never 2. How many drinks containing alcohol do you have on a typical day when you are drinking?: 1 or 2 (0) 3. How often do you have six or more drinks on one occasion?: Never (0) AUDIT-C Score: 0 4. How often during the last year have you found that you were not able to stop drinking once you had started?: Never 5. How often during the last year have you failed to do what was normally expected from you because of drinking?: Never 6. How often during the last year have you needed a first drink in the morning to get yourself going after a heavy drinking session?: Never 7. How often during the last year have you had a feeling of guilt of remorse after drinking?: Never 8. How  often during the last year have you been unable to remember what happened the night before because you had been drinking?: Never 9. Have you or someone else been injured as a result of your drinking?: No 10. Has a relative or friend or a doctor or another health worker been concerned about your drinking or suggested you cut down?: No Alcohol Use Disorder Identification Test Final Score (AUDIT): 0 Substance Abuse History in the last 12 months:  No. Consequences of Substance Abuse: NA Previous Psychotropic  Medications: Yes  Psychological Evaluations: No  Past Medical History:  Past Medical History:  Diagnosis Date   Eye globe prosthesis    GERD (gastroesophageal reflux disease)    History of blood transfusion 1973   "related to abscess burst in my stomach"   Hyperlipemia    Hyperlipidemia    Hypertension    Osteoarthritis of back    Lowerback    SVD (spontaneous vaginal delivery)    x 1, baby died at 2 wks of age   Type II diabetes mellitus (HCC)     Past Surgical History:  Procedure Laterality Date   APPENDECTOMY     COLONOSCOPY     DILATATION & CURETTAGE/HYSTEROSCOPY WITH MYOSURE N/A 02/25/2015   Procedure: DILATATION & CURETTAGE/HYSTEROSCOPY WITH MYOSURE;  Surgeon: [name], MD;  Location: [facility];  Service: Gynecology;  Laterality: N/A;   DILATION AND CURETTAGE OF UTERUS     ENUCLEATION Right 03/16/2017   ENUCLEATION Right 03/16/2017   Procedure: ENUCLEATION RIGHT EYE;  Surgeon: [name], MD;  Location: [facility] OR;  Service: Ophthalmology;  Laterality: Right;   EYE SURGERY     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6, 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   EYE SURGERY Right ~ 1974   "S/P initial eye injury; scissors stuck in my eye""   LAPAROSCOPIC CHOLECYSTECTOMY     SHOULDER ARTHROSCOPY WITH ROTATOR CUFF REPAIR Left    wire stitches per patient   SHOULDER ARTHROSCOPY WITH ROTATOR CUFF REPAIR Right    Family History:  Family History  Problem Relation Age of Onset   Diabetes Mother    CAD Mother    Lung cancer Father    Family Psychiatric  History: Unknown Tobacco Screening:   Social History:  Social History   Substance and Sexual Activity  Alcohol Use Yes   Comment: 03/16/2017 "nothing since 2013"     Social History   Substance and Sexual Activity  Drug Use No    Additional Social History:     Patient denies smoking, drinking or drug abuse". Patient reports living with her niece with no support system. Patient is widowed and has no kids.                      Allergies:    Allergies  Allergen Reactions   Aspirin Hives and Itching   Chocolate Hives and Itching   Cocoa Itching and Rash   Penicillins Hives, Itching, Swelling and Other (See Comments)    Has patient had a PCN reaction causing IMMEDIATE RASH, FACIAL/TONGUE/THROAT SWELLING, SOB, OR LIGHTHEADEDNESS WITH HYPOTENSION:  #  #  #  YES  #  #  #  Has patient had a PCN reaction causing severe rash involving mucus membranes or skin necrosis: No Has patient had a PCN reaction that required hospitalization No Has patient had a PCN reaction occurring within the last 10 years: No If all of the above answers are "NO", then may proceed with Cephalosporin use.  Sulfa Antibiotics Hives and Itching   Lab Results:  Results for orders placed or performed during the hospital encounter of 01/05/22 (from the past 48 hour(s))  Urine Drug Screen, Qualitative ([facility] only)     Status: None   Collection Time: 01/05/22  6:30 PM  Result Value Ref Range   Tricyclic, Ur Screen NONE DETECTED NONE DETECTED   Amphetamines, Ur Screen NONE DETECTED NONE DETECTED   MDMA (Ecstasy)Ur Screen NONE DETECTED NONE DETECTED   Cocaine Metabolite,Ur [place] NONE DETECTED NONE DETECTED   Opiate, Ur Screen NONE DETECTED NONE DETECTED   Phencyclidine (PCP) Ur S NONE DETECTED NONE DETECTED   Cannabinoid 50 Ng, Ur [place] NONE DETECTED NONE DETECTED   Barbiturates, Ur Screen NONE DETECTED NONE DETECTED   Benzodiazepine, Ur Scrn NONE DETECTED NONE DETECTED   Methadone Scn, Ur NONE DETECTED NONE DETECTED    Comment: (NOTE) Tricyclics + metabolites, urine    Cutoff 1000 ng/mL Amphetamines + metabolites, urine  Cutoff 1000 ng/mL MDMA (Ecstasy), urine              Cutoff 500 ng/mL Cocaine Metabolite, urine          Cutoff 300 ng/mL Opiate + metabolites, urine        Cutoff 300 ng/mL Phencyclidine (PCP), urine         Cutoff 25 ng/mL Cannabinoid, urine                 Cutoff 50 ng/mL Barbiturates + metabolites, urine  Cutoff 200 ng/mL Benzodiazepine,  urine              Cutoff 200 ng/mL Methadone, urine                   Cutoff 300 ng/mL  The urine drug screen provides only a preliminary, unconfirmed analytical test result and should not be used for non-medical purposes. Clinical consideration and professional judgment should be applied to any positive drug screen result due to possible interfering substances. A more specific alternate chemical method must be used in order to obtain a confirmed analytical result. Gas chromatography / mass spectrometry (GC/MS) is the preferred confirm atory method. Performed at [facility], [address] [place], [place] [address]   Lipid panel     Status: Abnormal   Collection Time: 01/06/22  6:08 AM  Result Value Ref Range   Cholesterol 201 (H) 0 - 200 mg/dL   Triglycerides 46 <329 mg/dL   HDL 46 >92 mg/dL   Total CHOL/HDL Ratio 4.4 RATIO   VLDL 9 0 - 40 mg/dL   LDL Cholesterol 426 (H) 0 - 99 mg/dL    Comment:        Total Cholesterol/HDL:CHD Risk Coronary Heart Disease Risk Table                     Men   Women  1/2 Average Risk   3.4   3.3  Average Risk       5.0   4.4  2 X Average Risk   9.6   7.1  3 X Average Risk  23.4   11.0        Use the calculated Patient Ratio above and the CHD Risk Table to determine the patient's CHD Risk.        ATP III CLASSIFICATION (LDL):  <100     mg/dL   Optimal  834-196  mg/dL   Near or Above  Optimal  130-159  mg/dL   Borderline  818-299  mg/dL   High  >371     mg/dL   Very High Performed at [facility], [address] [place], [place] [address]     Blood Alcohol level:  Lab Results  Component Value Date   [facility] <10 01/04/2022   ETH <10 12/28/2021    Metabolic Disorder Labs:  Lab Results  Component Value Date   HGBA1C 5.6 10/07/2021   MPG 114.02 10/07/2021   No results found for: PROLACTIN Lab Results  Component Value Date   CHOL 201 (H) 01/06/2022   TRIG 46 01/06/2022   HDL 46 01/06/2022    CHOLHDL 4.4 01/06/2022   VLDL 9 01/06/2022   LDLCALC 146 (H) 01/06/2022   LDLCALC 177 (H) 10/07/2021    Current Medications: Current Facility-Administered Medications  Medication Dose Route Frequency Provider Last Rate Last Admin   acetaminophen (TYLENOL) tablet 650 mg  650 mg Oral Q6H PRN Clapacs, Jackquline Denmark, MD   650 mg at 01/05/22 2105   alum & mag hydroxide-simeth (MAALOX/MYLANTA) 200-200-20 MG/5ML suspension 30 mL  30 mL Oral Q4H PRN Clapacs, Jackquline Denmark, MD       atenolol (TENORMIN) tablet 12.5 mg  12.5 mg Oral Daily Clapacs, John T, MD   12.5 mg at 01/06/22 1015   atorvastatin (LIPITOR) tablet 40 mg  40 mg Oral Daily Clapacs, Jackquline Denmark, MD   40 mg at 01/06/22 1014   divalproex (DEPAKOTE) DR tablet 500 mg  500 mg Oral QHS Clapacs, Jackquline Denmark, MD       hydrOXYzine (ATARAX) tablet 50 mg  50 mg Oral Q6H PRN Clapacs, Jackquline Denmark, MD       ibuprofen (ADVIL) tablet 800 mg  800 mg Oral TID PRN Clapacs, Jackquline Denmark, MD       lisinopril (ZESTRIL) tablet 10 mg  10 mg Oral Daily Clapacs, Jackquline Denmark, MD   10 mg at 01/06/22 1011   magnesium hydroxide (MILK OF MAGNESIA) suspension 30 mL  30 mL Oral Daily PRN Clapacs, Jackquline Denmark, MD       nicotine (NICODERM CQ - dosed in mg/24 hours) patch 21 mg  21 mg Transdermal Q0600 Clapacs, Jackquline Denmark, MD       OLANZapine (ZYPREXA) tablet 10 mg  10 mg Oral QHS Clapacs, John T, MD       pantoprazole (PROTONIX) EC tablet 40 mg  40 mg Oral BID AC Clapacs, Jackquline Denmark, MD   40 mg at 01/06/22 9381   traZODone (DESYREL) tablet 50 mg  50 mg Oral QHS Clapacs, Jackquline Denmark, MD       PTA Medications: Medications Prior to Admission  Medication Sig Dispense Refill Last Dose   amantadine (SYMMETREL) 100 MG capsule Take 1 capsule (100 mg total) by mouth 2 (two) times daily. (Patient not taking: Reported on 12/28/2021) 60 capsule 0    atenolol (TENORMIN) 25 MG tablet Take 0.5 tablets (12.5 mg total) by mouth daily. 15 tablet 0    atorvastatin (LIPITOR) 40 MG tablet Take 1 tablet (40 mg total) by mouth daily. 30 tablet 0     divalproex (DEPAKOTE) 500 MG DR tablet Take 1 tablet (500 mg total) by mouth at bedtime. (Patient not taking: Reported on 01/04/2022) 30 tablet 2    ibuprofen (ADVIL) 200 MG tablet Take 800 mg by mouth See admin instructions. Take 800 mg by mouth two to three times a day as needed for pain  lisinopril (ZESTRIL) 10 MG tablet Take 1 tablet (10 mg total) by mouth daily. 30 tablet 0    methimazole (TAPAZOLE) 10 MG tablet Take 1 tablet (10 mg total) by mouth daily. 30 tablet 0    OLANZapine (ZYPREXA) 10 MG tablet Take 1 tablet (10 mg total) by mouth at bedtime. (Patient not taking: Reported on 01/04/2022) 30 tablet 2    pantoprazole (PROTONIX) 40 MG tablet Take 1 tablet (40 mg total) by mouth 2 (two) times daily before a meal. 60 tablet 0    traZODone (DESYREL) 50 MG tablet Take 1 tablet (50 mg total) by mouth at bedtime. (Patient not taking: Reported on 01/04/2022) 30 tablet 2     Musculoskeletal: Strength & Muscle Tone: within normal limits Gait & Station: normal Patient leans: N/A            Psychiatric Specialty Exam:  Presentation  General Appearance: Appropriate for Environment; Casual  Eye Contact:Good  Speech:Clear and Coherent; Normal Rate  Speech Volume:Normal  Handedness:Right   Mood and Affect  Mood:Euthymic  Affect:Appropriate; Congruent   Thought Process  Thought Processes:Coherent; Linear  Duration of Psychotic Symptoms: Greater than six months  Past Diagnosis of Schizophrenia or Psychoactive disorder: Yes  Descriptions of Associations:Intact  Orientation:Full (Time, Place and Person)  Thought Content:Logical  Hallucinations:No data recorded Ideas of Reference:None  Suicidal Thoughts:No data recorded Homicidal Thoughts:No data recorded  Sensorium  Memory:Immediate Good; Recent Good; Remote Good  Judgment:Good  Insight:Good   Executive Functions  Concentration:Good  Attention Span:Good  Recall:Good  Fund of  Knowledge:Good  Language:Good   Psychomotor Activity  Psychomotor Activity:No data recorded  Assets  Assets:Financial Resources/Insurance; Desire for Improvement; Communication Skills; Leisure Time; Physical Health; Resilience   Sleep  Sleep:No data recorded   Physical Exam: Physical Exam Constitutional:      Appearance: Normal appearance.  HENT:     Head: Normocephalic and atraumatic.     Mouth/Throat:     Pharynx: Oropharynx is clear.  Eyes:     Pupils: Pupils are equal, round, and reactive to light.  Cardiovascular:     Rate and Rhythm: Normal rate and regular rhythm.  Pulmonary:     Effort: Pulmonary effort is normal.     Breath sounds: Normal breath sounds.  Abdominal:     General: Abdomen is flat.     Palpations: Abdomen is soft.  Musculoskeletal:        General: Normal range of motion.  Skin:    General: Skin is warm and dry.  Neurological:     General: No focal deficit present.     Mental Status: [name] alert. Mental status is at baseline.  Psychiatric:        Attention and Perception: Attention normal. She perceives auditory hallucinations.        Mood and Affect: Mood is anxious. Affect is flat.        Speech: Speech normal.        Behavior: Behavior normal. Behavior is cooperative.        Thought Content: Thought content is paranoid.        Cognition and Memory: Cognition and memory normal.        Judgment: Judgment is inappropriate.   Review of Systems  Constitutional: Negative.   HENT: Negative.    Eyes: Negative.   Respiratory: Negative.    Cardiovascular: Negative.   Gastrointestinal: Negative.   Genitourinary: Negative.   Musculoskeletal: Negative.   Skin: Negative.   Neurological: Negative.   Endo/Heme/Allergies: Negative.  Psychiatric/Behavioral:  Positive for depression and hallucinations. The patient is nervous/anxious and has insomnia.   Blood pressure (!) 143/72, pulse 75, temperature 98.1 F (36.7 C), temperature source Oral,  resp. rate 18, height 5\' 4"  (1.626 m), weight 53.5 kg, SpO2 (!) 84 %. Body mass index is 20.25 kg/m.  Treatment Plan Summary: Daily contact with patient to assess and evaluate symptoms and progress in treatment, Medication management, and Plan restart her past psychiatric medications which included Zyprexa 10 mg at bedtime and Depakote 500 mg at bedtime.  Also, trazodone.  Observation Level/Precautions:  15 minute checks  Laboratory:  CBC Chemistry Profile HbAIC UDS UA  Psychotherapy:    Medications:    Consultations:    Discharge Concerns:    Estimated LOS:  Other:     Physician Treatment Plan for Primary Diagnosis: Schizoaffective disorder, bipolar type (HCC) Long Term Goal(s): Improvement in symptoms so as ready for discharge  Short Term Goals: Ability to identify changes in lifestyle to reduce recurrence of condition will improve, Ability to verbalize feelings will improve, Ability to disclose and discuss suicidal ideas, Ability to demonstrate self-control will improve, Ability to identify and develop effective coping behaviors will improve, Ability to maintain clinical measurements within normal limits will improve, Compliance with prescribed medications will improve, and Ability to identify triggers associated with substance abuse/mental health issues will improve  Physician Treatment Plan for Secondary Diagnosis: Principal Problem:   Schizoaffective disorder, bipolar type (HCC) Active Problems:   Schizophrenia (HCC)   I certify that inpatient services furnished can reasonably be expected to improve the patient's condition.    Sarina Illichard Edward Kourtnee Lahey, DO 2/2/202310:24 AM

## 2022-01-06 NOTE — Progress Notes (Signed)
It was reported that patient refused her medications last night. Patient complained that she did not sleep at all last night. Patient was reeducated on the importance of taking her medications as ordered, patient verbalized understanding and stated that she would take them tonight. Patient denies SI/ HI/ AVH. Patient also denies Depression, anxiety and pain. LBM on 01/05/2022. Patient compliant with all scheduled meds on this shift.  Patient spent most of shift in her room. Q15 minute safety check rounds maintained. Will continue to monitor.

## 2022-01-07 DIAGNOSIS — F250 Schizoaffective disorder, bipolar type: Secondary | ICD-10-CM | POA: Diagnosis not present

## 2022-01-07 LAB — HEMOGLOBIN A1C
Hgb A1c MFr Bld: 6.4 % — ABNORMAL HIGH (ref 4.8–5.6)
Mean Plasma Glucose: 137 mg/dL

## 2022-01-07 NOTE — Group Note (Signed)
[facility] LCSW Group Therapy Note ° ° °Group Date: 01/07/2022 °Start Time: 1415 °End Time: 1430 ° ° °Type of Therapy/Topic:  Group Therapy:  Balance in Life ° °Participation Level:  Did Not Attend  ° °Description of Group:   ° This group will address the concept of balance and how it feels and looks when one is unbalanced. Patients will be encouraged to process areas in their lives that are out of balance, and identify reasons for remaining unbalanced. Facilitators will guide patients utilizing problem- solving interventions to address and correct the stressor making their life unbalanced. Understanding and applying boundaries will be explored and addressed for obtaining  and maintaining a balanced life. Patients will be encouraged to explore ways to assertively make their unbalanced needs known to significant others in their lives, using other group members and facilitator for support and feedback. ° °Therapeutic Goals: °Patient will identify two or more emotions or situations they have that consume much of in their lives. °Patient will identify signs/triggers that life has become out of balance:  °Patient will identify two ways to set boundaries in order to achieve balance in their lives:  °Patient will demonstrate ability to communicate their needs through discussion and/or role plays ° °Summary of Patient Progress: ° °X ° ° ° °Therapeutic Modalities:   °Cognitive Behavioral Therapy °Solution-Focused Therapy °Assertiveness Training ° ° °Kirstan Fentress A Yessika Otte, LCSWA °

## 2022-01-07 NOTE — Plan of Care (Signed)
Patient presents A&O x 4. Patient affect is calm and pleasant and behavior withdrawn and isolative.  Denies AVH, SI, HI, depression, anxiety or pain.  Reports sleeping well.  VSS.  Patient compliant with all scheduled meds.  Patient spent most of shift in her room except for meals.   Ongoing Q15 minute safety check rounds per unit protocol.  Problem: Education: Goal: Knowledge of [facility] Education information/materials will improve Outcome: Progressing Goal: Emotional status will improve Outcome: Progressing Goal: Mental status will improve Outcome: Progressing Goal: Verbalization of understanding the information provided will improve Outcome: Progressing   Problem: Health Behavior/Discharge Planning: Goal: Compliance with treatment plan for underlying cause of condition will improve Outcome: Progressing

## 2022-01-07 NOTE — Progress Notes (Signed)
Recreation Therapy Notes  Date: 01/07/2022  Time: 1:30pm    Location: Craft room    Behavioral response: N/A   Intervention Topic: Time Management   Discussion/Intervention: Patient refused to attend group.   Clinical Observations/Feedback:  Patient refused to attend group.   Chima Astorino LRT/CTRS         Aneri Slagel 01/07/2022 3:36 PM

## 2022-01-07 NOTE — Progress Notes (Signed)
Patient spent most of the shift resting in bed. She took all of her medicine besides her trazodone on shift. She denies SI, HI & AVH. No new behavioral issues to report on shift thus far.

## 2022-01-07 NOTE — [facility] Suicide Risk Assessment (Signed)
[place] INPATIENT:  Family/Significant Other Suicide Prevention Education  Suicide Prevention Education:  Education Completed; Eliezer Bottom, niece (name of family member/significant other) has been identified by the patient as the family member/significant other with whom the patient will be residing, and identified as the person(s) who will aid the patient in the event of a mental health crisis (suicidal ideations/suicide attempt).  With written consent from the patient, the family member/significant other has been provided the following suicide prevention education, prior to the and/or following the discharge of the patient.  The suicide prevention education provided includes the following: Suicide risk factors Suicide prevention and interventions National Suicide Hotline telephone number [facility] assessment telephone number [facility] Emergency Assistance [place] and/or Residential Mobile Crisis Unit telephone number  Request made of family/significant other to: Remove weapons (e.g., guns, rifles, knives), all items previously/currently identified as safety concern.   Remove drugs/medications (over-the-counter, prescriptions, illicit drugs), all items previously/currently identified as a safety concern.  The family member/significant other verbalizes understanding of the suicide prevention education information provided.  The family member/significant other agrees to remove the items of safety concern listed above.  Eudell Julian A [place] 01/07/2022, 11:21 AM
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
## 2022-01-08 DIAGNOSIS — F250 Schizoaffective disorder, bipolar type: Secondary | ICD-10-CM | POA: Diagnosis not present

## 2022-01-08 MED ORDER — METHIMAZOLE 10 MG PO TABS
10.0000 mg | ORAL_TABLET | Freq: Every day | ORAL | Status: DC
  Administered 2022-01-08 – 2022-01-18 (×11): 10 mg via ORAL
  Filled 2022-01-08 (×11): qty 1

## 2022-01-08 NOTE — Progress Notes (Signed)
Patient denies SI, HI, and AVH. She complains of left leg pain and rated it as a 7/10. Tylenol PRN given. Patient was educated on medications and was compliant with scheduled medications. Patient voiced no complaints at this time. Patient remains safe on the unit at this time.

## 2022-01-08 NOTE — Progress Notes (Signed)
Patient presents A &Ox4. She was calm and pleasant throughout the night.  She remained in her room away from others.  Denies AVH, SI, HI, depression, or pain.  She came to day room to get snack and then back to her room.  She was medication compliant.  Ongoing Q15 minute safety check rounds per unit protocol.

## 2022-01-08 NOTE — Progress Notes (Signed)
[facility] MD Progress Note  01/08/2022 1:54 PM Shari [name]  MRN:  [id number]   Principal Problem: Schizoaffective disorder, bipolar type ([place]) Diagnosis: Principal Problem:   Schizoaffective disorder, bipolar type [facility]) Active Problems:   Schizophrenia [facility])  Patient is a  65y.o. female with Schizophrenia, who presents to the Geriatric BH unit due to psychosis, not taking care of self in settings of non-compliance with psych medications.  Interval History Patient was seen today for re-evaluation.  Nursing reports no events overnight. The patient has no issues with performing ADLs.  Patient has been medication compliant.    Subjective:  On assessment patient reports "I a doing good". She denies any complaints. She denies feeling depressed, anxious. Denies hallucinations. She does not express delusions, although appears guarded and somewhat paranoid. When asked about showed, she said "not yet'.    Labs: no new results for review.     Total Time spent with patient: 15 minutes  Past Psychiatric History: see H&P   Past Medical History:  Past Medical History:  Diagnosis Date   Eye globe prosthesis    GERD (gastroesophageal reflux disease)    History of blood transfusion 1973   "related to abscess burst in my stomach"   Hyperlipemia    Hyperlipidemia    Hypertension    Osteoarthritis of back    Lowerback    SVD (spontaneous vaginal delivery)    x 1, baby died at 2 wks of age   Type II diabetes mellitus ([place])     Past Surgical History:  Procedure Laterality Date   APPENDECTOMY     COLONOSCOPY     DILATATION & CURETTAGE/HYSTEROSCOPY WITH MYOSURE N/A 02/25/2015   Procedure: DILATATION & CURETTAGE/HYSTEROSCOPY WITH MYOSURE;  Surgeon: Marylynn Pearson, MD;  Location: [facility];  Service: Gynecology;  Laterality: N/A;   DILATION AND CURETTAGE OF UTERUS     ENUCLEATION Right 03/16/2017   ENUCLEATION Right 03/16/2017   Procedure: ENUCLEATION RIGHT EYE;  Surgeon: Clista Bernhardt, MD;   Location: [place];  Service: Ophthalmology;  Laterality: Right;   EYE SURGERY     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6, 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   EYE SURGERY Right ~ 1974   "S/P initial eye injury; scissors stuck in my eye""   LAPAROSCOPIC CHOLECYSTECTOMY     SHOULDER ARTHROSCOPY WITH ROTATOR CUFF REPAIR Left    wire stitches per patient   SHOULDER ARTHROSCOPY WITH ROTATOR CUFF REPAIR Right    Family History:  Family History  Problem Relation Age of Onset   Diabetes Mother    CAD Mother    Lung cancer Father    Family Psychiatric  History: see H&P  Social History:  Social History   Substance and Sexual Activity  Alcohol Use Yes   Comment: 03/16/2017 "nothing since 2013"     Social History   Substance and Sexual Activity  Drug Use No    Social History   Socioeconomic History   Marital status: Widowed    Spouse name: Not on file   Number of children: Not on file   Years of education: Not on file   Highest education level: Not on file  Occupational History   Not on file  Tobacco Use   Smoking status: Former    Packs/day: 0.50    Years: 40.00    Pack years: 20.00    Types: Cigarettes    Quit date: 2016    Years since quitting: 7.0   Smokeless tobacco: Never  Vaping Use  Vaping Use: Never used  Substance and Sexual Activity   Alcohol use: Yes    Comment: 03/16/2017 "nothing since 2013"   Drug use: No   Sexual activity: Not Currently    Birth control/protection: Post-menopausal  Other Topics Concern   Not on file  Social History Narrative   Not on file   Social Determinants of Health   Financial Resource Strain: Not on file  Food Insecurity: Not on file  Transportation Needs: Not on file  Physical Activity: Not on file  Stress: Not on file  Social Connections: Not on file   Additional Social History:                         Sleep: Fair  Appetite:  Fair  Current Medications: Current Facility-Administered Medications  Medication Dose Route Frequency Provider  Last Rate Last Admin   acetaminophen (TYLENOL) tablet 650 mg  650 mg Oral Q6H PRN Clapacs, John T, MD   650 mg at 01/05/22 2105   alum & mag hydroxide-simeth (MAALOX/MYLANTA) 200-200-20 MG/5ML suspension 30 mL  30 mL Oral Q4H PRN Clapacs, John T, MD       atenolol (TENORMIN) tablet 12.5 mg  12.5 mg Oral Daily Clapacs, John T, MD   12.5 mg at 01/08/22 [id number]   atorvastatin (LIPITOR) tablet 40 mg  40 mg Oral Daily Clapacs, John T, MD   40 mg at 01/08/22 [id number]   divalproex (DEPAKOTE) DR tablet 500 mg  500 mg Oral QHS Clapacs, John T, MD   500 mg at 01/07/22 2118   hydrOXYzine (ATARAX) tablet 50 mg  50 mg Oral Q6H PRN Clapacs, John T, MD       ibuprofen (ADVIL) tablet 800 mg  800 mg Oral TID PRN Clapacs, John T, MD   800 mg at 01/08/22 0929   lisinopril (ZESTRIL) tablet 10 mg  10 mg Oral Daily Clapacs, John T, MD   10 mg at 01/08/22 [id number]   magnesium hydroxide (MILK OF MAGNESIA) suspension 30 mL  30 mL Oral Daily PRN Clapacs, Jackquline DenmarkJohn T, MD       methimazole (TAPAZOLE) tablet 10 mg  10 mg Oral Daily Lynnita Somma, MD   10 mg at 01/08/22 1011   nicotine (NICODERM CQ - dosed in mg/24 hours) patch 21 mg  21 mg Transdermal Q0600 Clapacs, John T, MD       OLANZapine (ZYPREXA) tablet 10 mg  10 mg Oral QHS Clapacs, John T, MD   10 mg at 01/07/22 2118   pantoprazole (PROTONIX) EC tablet 40 mg  40 mg Oral BID AC Clapacs, John T, MD   40 mg at 01/08/22 [id number]   traZODone (DESYREL) tablet 50 mg  50 mg Oral QHS Clapacs, Jackquline DenmarkJohn T, MD   50 mg at 01/07/22 2117    Lab Results: No results found for this or any previous visit (from the past 48 hour(s)).  Blood Alcohol level:  Lab Results  Component Value Date   ETH <10 01/04/2022   ETH <10 12/28/2021    Metabolic Disorder Labs: Lab Results  Component Value Date   HGBA1C 6.4 (H) 01/06/2022   MPG 137 01/06/2022   MPG 114.02 10/07/2021   No results found for: PROLACTIN Lab Results  Component Value Date   CHOL 201 (H) 01/06/2022   TRIG 46 01/06/2022   HDL 46  01/06/2022   CHOLHDL 4.4 01/06/2022   VLDL 9 01/06/2022   LDLCALC 146 (H) 01/06/2022   LDLCALC  177 (H) 10/07/2021    Physical Findings: AIMS:  , ,  ,  ,    CIWA:    COWS:     Musculoskeletal: Strength & Muscle Tone: within normal limits Gait & Station: normal Patient leans: N/A  Psychiatric Specialty Exam:  Presentation  General Appearance: Appropriate for Environment; Casual  Eye Contact:Good  Speech:Clear and Coherent; Normal Rate  Speech Volume:Normal  Handedness:Right   Mood and Affect  Mood:Euthymic  Affect:Appropriate; Congruent   Thought Process  Thought Processes:Coherent; Linear  Descriptions of Associations:Intact  Orientation:Full (Time, Place and Person)  Thought Content: does not express delusions today  History of Schizophrenia/Schizoaffective disorder:Yes  Duration of Psychotic Symptoms:Greater than six months  Hallucinations: denies Ideas of Reference:None  Suicidal Thoughts: denies Homicidal Thoughts: denies  Sensorium  Memory:Immediate Good; Recent Good; Remote Good  Judgment: limited Insight: limited  Executive Functions  Concentration:Good  Attention Span:Good  [place] of Knowledge:Good  Language:Good   Psychomotor Activity  Psychomotor Activity:No data recorded  Assets  Assets:Financial Resources/Insurance; Desire for Improvement; Communication Skills; Leisure Time; Physical Health; Resilience   Sleep  Sleep:No data recorded   Physical Exam: Physical Exam ROS Blood pressure (!) 139/59, pulse 65, temperature 98 F (36.7 C), temperature source Oral, resp. rate 20, height 5\' 4"  (1.626 m), weight 53.5 kg, SpO2 100 %. Body mass index is 20.25 kg/m.   Treatment Plan Summary: Daily contact with patient to assess and evaluate symptoms and progress in treatment and Medication management  Patient is a 66 year old female with the above-stated past psychiatric history who is seen in follow-up.  Chart  reviewed. Patient discussed with nursing. Patient appears brighter. [name] taking medications. Still not taking shower. No changes in medicines today.    Plan:  -continue inpatient psych admission; 15-minute checks; daily contact with patient to assess and evaluate symptoms and progress in treatment; psychoeducation.  -continue scheduled medications:Olanzapine 10mg  po qhs for psychosis, Depakote 500mg  PO QHS for mood stabilization, Trazodone 50mg  PO QHS for sleep.  Patient noted, that Methimazole, her home medication, was not ordered on admission - restarted.   atenolol  12.5 mg Oral Daily   atorvastatin  40 mg Oral Daily   divalproex  500 mg Oral QHS   lisinopril  10 mg Oral Daily   methimazole  10 mg Oral Daily   nicotine  21 mg Transdermal Q0600   OLANZapine  10 mg Oral QHS   pantoprazole  40 mg Oral BID AC   traZODone  50 mg Oral QHS    -continue PRN medications.  acetaminophen, alum & mag hydroxide-simeth, hydrOXYzine, ibuprofen, magnesium hydroxide   -Disposition: Likely d/c home with outpatient psych follow-up.  -  I certify that the patient does need, on a daily basis, active treatment furnished directly by or requiring the supervision of inpatient psychiatric facility personnel.    Larita Fife, MD 01/08/2022, 1:54 PM

## 2022-01-09 DIAGNOSIS — F250 Schizoaffective disorder, bipolar type: Secondary | ICD-10-CM | POA: Diagnosis not present

## 2022-01-09 NOTE — Group Note (Signed)
LCSW Group Therapy Note  Group Date: 01/09/2022 Start Time: 1315 End Time: 1415   Type of Therapy and Topic:  Group Therapy - How To Cope with Nervousness about Discharge   Participation Level:  Did Not Attend   Description of Group This process group involved identification of patients' feelings about discharge. Some of them are scheduled to be discharged soon, while others are new admissions, but each of them was asked to share thoughts and feelings surrounding discharge from the hospital. One common theme was that they are excited at the prospect of going home, while another was that many of them are apprehensive about sharing why they were hospitalized. Patients were given the opportunity to discuss these feelings with their peers in preparation for discharge.  Therapeutic Goals  Patient will identify their overall feelings about pending discharge. Patient will think about how they might proactively address issues that they believe will once again arise once they get home (i.e. with parents). Patients will participate in discussion about having hope for change.   Summary of Patient Progress: Patient did not attend group despite encouraged participation.    Therapeutic Modalities Cognitive Behavioral Therapy   Norberto Sorenson, LCSWA 01/09/2022  2:50 PM

## 2022-01-09 NOTE — Progress Notes (Signed)
[facility] MD Progress Note  01/09/2022 12:29 PM Shari [name]  MRN:  [id number]   Principal Problem: Schizoaffective disorder, bipolar type [facility]) Diagnosis: Principal Problem:   Schizoaffective disorder, bipolar type [facility]) Active Problems:   Schizophrenia [facility])  Patient is a  65y.o. female with Schizophrenia, who presents to the Geriatric BH unit due to psychosis, not taking care of self in settings of non-compliance with psych medications.  Interval History Patient was seen today for re-evaluation.  Nursing reports no events overnight. The patient has no issues with performing ADLs.  Patient has been medication compliant.    Subjective:  On assessment patient reports "I am good". She reports having no complaints. She denies feeling depressed, anxious. Denies hallucinations. She does not express delusions, although appears guarded and somewhat paranoid. She disclosed that she is anxious about her future as she is not sure if she can come back and live to her previous place. When asked about showed, she said "not yet' again.    Labs: no new results for review.     Total Time spent with patient: 15 minutes  Past Psychiatric History: see H&P   Past Medical History:  Past Medical History:  Diagnosis Date   Eye globe prosthesis    GERD (gastroesophageal reflux disease)    History of blood transfusion 1973   "related to abscess burst in my stomach"   Hyperlipemia    Hyperlipidemia    Hypertension    Osteoarthritis of back    Lowerback    SVD (spontaneous vaginal delivery)    x 1, baby died at 2 wks of age   Type II diabetes mellitus (HCC)     Past Surgical History:  Procedure Laterality Date   APPENDECTOMY     COLONOSCOPY     DILATATION & CURETTAGE/HYSTEROSCOPY WITH MYOSURE N/A 02/25/2015   Procedure: DILATATION & CURETTAGE/HYSTEROSCOPY WITH MYOSURE;  Surgeon: [name], MD;  Location: [facility];  Service: Gynecology;  Laterality: N/A;   DILATION AND CURETTAGE OF UTERUS      ENUCLEATION Right 03/16/2017   ENUCLEATION Right 03/16/2017   Procedure: ENUCLEATION RIGHT EYE;  Surgeon: [name], MD;  Location: [facility] OR;  Service: Ophthalmology;  Laterality: Right;   EYE SURGERY     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6, 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   EYE SURGERY Right ~ 1974   "S/P initial eye injury; scissors stuck in my eye""   LAPAROSCOPIC CHOLECYSTECTOMY     SHOULDER ARTHROSCOPY WITH ROTATOR CUFF REPAIR Left    wire stitches per patient   SHOULDER ARTHROSCOPY WITH ROTATOR CUFF REPAIR Right    Family History:  Family History  Problem Relation Age of Onset   Diabetes Mother    CAD Mother    Lung cancer Father    Family Psychiatric  History: see H&P  Social History:  Social History   Substance and Sexual Activity  Alcohol Use Yes   Comment: 03/16/2017 "nothing since 2013"     Social History   Substance and Sexual Activity  Drug Use No    Social History   Socioeconomic History   Marital status: Widowed    Spouse name: Not on file   Number of children: Not on file   Years of education: Not on file   Highest education level: Not on file  Occupational History   Not on file  Tobacco Use   Smoking status: Former    Packs/day: 0.50    Years: 40.00    Pack years: 20.00  Types: Cigarettes    Quit date: 2016    Years since quitting: 7.1   Smokeless tobacco: Never  Vaping Use   Vaping Use: Never used  Substance and Sexual Activity   Alcohol use: Yes    Comment: 03/16/2017 "nothing since 2013"   Drug use: No   Sexual activity: Not Currently    Birth control/protection: Post-menopausal  Other Topics Concern   Not on file  Social History Narrative   Not on file   Social Determinants of Health   Financial Resource Strain: Not on file  Food Insecurity: Not on file  Transportation Needs: Not on file  Physical Activity: Not on file  Stress: Not on file  Social Connections: Not on file   Additional Social History:                         Sleep:  Fair  Appetite:  Fair  Current Medications: Current Facility-Administered Medications  Medication Dose Route Frequency Provider Last Rate Last Admin   acetaminophen (TYLENOL) tablet 650 mg  650 mg Oral Q6H PRN Clapacs, John T, MD   650 mg at 01/05/22 2105   alum & mag hydroxide-simeth (MAALOX/MYLANTA) 200-200-20 MG/5ML suspension 30 mL  30 mL Oral Q4H PRN Clapacs, John T, MD       atenolol (TENORMIN) tablet 12.5 mg  12.5 mg Oral Daily Clapacs, John T, MD   12.5 mg at 01/09/22 [id number]   atorvastatin (LIPITOR) tablet 40 mg  40 mg Oral Daily Clapacs, John T, MD   40 mg at 01/09/22 [id number]   divalproex (DEPAKOTE) DR tablet 500 mg  500 mg Oral QHS Clapacs, John T, MD   500 mg at 01/08/22 2103   hydrOXYzine (ATARAX) tablet 50 mg  50 mg Oral Q6H PRN Clapacs, John T, MD       ibuprofen (ADVIL) tablet 800 mg  800 mg Oral TID PRN Clapacs, John T, MD   800 mg at 01/09/22 [id number]   lisinopril (ZESTRIL) tablet 10 mg  10 mg Oral Daily Clapacs, John T, MD   10 mg at 01/09/22 [id number]   magnesium hydroxide (MILK OF MAGNESIA) suspension 30 mL  30 mL Oral Daily PRN Clapacs, Madie Reno, MD       methimazole (TAPAZOLE) tablet 10 mg  10 mg Oral Daily Trevell Pariseau, Delrae Rend, MD   10 mg at 01/09/22 [id number]   nicotine (NICODERM CQ - dosed in mg/24 hours) patch 21 mg  21 mg Transdermal Q0600 Clapacs, John T, MD       OLANZapine (ZYPREXA) tablet 10 mg  10 mg Oral QHS Clapacs, John T, MD   10 mg at 01/08/22 2103   pantoprazole (PROTONIX) EC tablet 40 mg  40 mg Oral BID AC Clapacs, John T, MD   40 mg at 01/09/22 0900   traZODone (DESYREL) tablet 50 mg  50 mg Oral QHS Clapacs, Madie Reno, MD   50 mg at 01/07/22 2117    Lab Results: No results found for this or any previous visit (from the past 74 hour(s)).  Blood Alcohol level:  Lab Results  Component Value Date   ETH <10 01/04/2022   ETH <10 123456    Metabolic Disorder Labs: Lab Results  Component Value Date   HGBA1C 6.4 (H) 01/06/2022   MPG 137 01/06/2022   MPG 114.02 10/07/2021    No results found for: PROLACTIN Lab Results  Component Value Date   CHOL 201 (H) 01/06/2022   TRIG 46  01/06/2022   HDL 46 01/06/2022   CHOLHDL 4.4 01/06/2022   VLDL 9 01/06/2022   LDLCALC 146 (H) 01/06/2022   LDLCALC 177 (H) 10/07/2021    Physical Findings: AIMS:  , ,  ,  ,    CIWA:    COWS:     Musculoskeletal: Strength & Muscle Tone: within normal limits Gait & Station: normal Patient leans: N/A  Psychiatric Specialty Exam:  Presentation  General Appearance: Appropriate for Environment; Casual  Eye Contact:Good  Speech:Clear and Coherent; Normal Rate  Speech Volume:Normal  Handedness:Right   Mood and Affect  Mood:Euthymic  Affect:Appropriate; Congruent   Thought Process  Thought Processes:Coherent; Linear  Descriptions of Associations:Intact  Orientation:Full (Time, Place and Person)  Thought Content: does not express delusions today  History of Schizophrenia/Schizoaffective disorder:Yes  Duration of Psychotic Symptoms:Greater than six months  Hallucinations: denies Ideas of Reference:None  Suicidal Thoughts: denies Homicidal Thoughts: denies  Sensorium  Memory:Immediate Good; Recent Good; Remote Good  Judgment: limited Insight: limited  Executive Functions  Concentration:Good  Attention Span:Good  [place] of Knowledge:Good  Language:Good   Psychomotor Activity  Psychomotor Activity:No data recorded  Assets  Assets:Financial Resources/Insurance; Desire for Improvement; Communication Skills; Leisure Time; Physical Health; Resilience   Sleep  Sleep:No data recorded   Physical Exam: Physical Exam ROS Blood pressure (!) 114/54, pulse 66, temperature 97.6 F (36.4 C), temperature source Oral, resp. rate 18, height 5\' 4"  (1.626 m), weight 53.5 kg, SpO2 100 %. Body mass index is 20.25 kg/m.   Treatment Plan Summary: Daily contact with patient to assess and evaluate symptoms and progress in treatment and  Medication management  Patient is a 66 year old female with the above-stated past psychiatric history who is seen in follow-up.  Chart reviewed. Patient discussed with nursing. Patient appears brighter. [name] taking medications. Still not taking shower. No changes in medicines today.    Plan:  -continue inpatient psych admission; 15-minute checks; daily contact with patient to assess and evaluate symptoms and progress in treatment; psychoeducation.  -continue scheduled medications:Olanzapine 10mg  po qhs for psychosis, Depakote 500mg  PO QHS for mood stabilization, Trazodone 50mg  PO QHS for sleep.  Patient noted, that Methimazole, her home medication, was not ordered on admission - restarted.   atenolol  12.5 mg Oral Daily   atorvastatin  40 mg Oral Daily   divalproex  500 mg Oral QHS   lisinopril  10 mg Oral Daily   methimazole  10 mg Oral Daily   nicotine  21 mg Transdermal Q0600   OLANZapine  10 mg Oral QHS   pantoprazole  40 mg Oral BID AC   traZODone  50 mg Oral QHS    -continue PRN medications.  acetaminophen, alum & mag hydroxide-simeth, hydrOXYzine, ibuprofen, magnesium hydroxide   -Disposition: Likely d/c home with outpatient psych follow-up.  -  I certify that the patient does need, on a daily basis, active treatment furnished directly by or requiring the supervision of inpatient psychiatric facility personnel.    Larita Fife, MD 01/09/2022, 12:29 PM

## 2022-01-09 NOTE — Progress Notes (Signed)
Patient was A & O x4. She was pleasant throughout the night.  She remained in her room.  Denies AVH, SI, HI, or pain.  [name]  medication complaint.  Ongoing Q15 minutes safety check rounds per unit protocol.

## 2022-01-09 NOTE — Plan of Care (Addendum)
Patient presents A&O x 4. Patient affect is calm and pleasant and behavior withdrawn and isolative.  Denies AVH, SI, HI, depression or anxiety.  Patient reports left leg pain, rates 5/10.  Ibuprofen given as ordered with relief reported.  Reports sleeping well.  VSS.  Patient compliant with all scheduled meds.  Patient spent most of shift in her room except for meals.   Ongoing Q15 minute safety check rounds per unit protocol.  Problem: Education: Goal: Knowledge of [place] [facility] Education information/materials will improve Outcome: Progressing Goal: Emotional status will improve Outcome: Progressing Goal: Mental status will improve Outcome: Progressing Goal: Verbalization of understanding the information provided will improve Outcome: Progressing   Problem: Activity: Goal: Sleeping patterns will improve Outcome: Progressing   Problem: Coping: Goal: Ability to verbalize frustrations and anger appropriately will improve Outcome: Progressing   Problem: Health Behavior/Discharge Planning: Goal: Compliance with treatment plan for underlying cause of condition will improve Outcome: Progressing   Problem: Safety: Goal: Periods of time without injury will increase Outcome: Progressing

## 2022-01-10 DIAGNOSIS — F250 Schizoaffective disorder, bipolar type: Secondary | ICD-10-CM | POA: Diagnosis not present

## 2022-01-10 MED ORDER — TRAZODONE HCL 50 MG PO TABS
50.0000 mg | ORAL_TABLET | Freq: Every evening | ORAL | Status: DC | PRN
  Administered 2022-01-17: 50 mg via ORAL
  Filled 2022-01-10: qty 1

## 2022-01-10 NOTE — Progress Notes (Signed)
[facility] MD Progress Note  01/10/2022 11:48 AM Shari [name]  MRN:  [id number] Subjective: Shari [name] denies all signs and symptoms.  [name] taking her medications as prescribed and denies any side effects.  Her last hospitalization was at [facility] behavioral [facility] back in November.  She does not follow up with outpatient.  She declined ACT team.  Social work has been in contact with her niece.  Niece states that it is her apartment not the patient's.  Patient has not been in contact with family.  Apparently, patient is not allowed back into the apartment with the niece.  Principal Problem: Schizoaffective disorder, bipolar type (HCC) Diagnosis: Principal Problem:   Schizoaffective disorder, bipolar type (HCC) Active Problems:   Schizophrenia (HCC)  Total Time spent with patient: 15 minutes  Past Psychiatric History: yes  Past Medical History:  Past Medical History:  Diagnosis Date   Eye globe prosthesis    GERD (gastroesophageal reflux disease)    History of blood transfusion 1973   "related to abscess burst in my stomach"   Hyperlipemia    Hyperlipidemia    Hypertension    Osteoarthritis of back    Lowerback    SVD (spontaneous vaginal delivery)    x 1, baby died at 2 wks of age   Type II diabetes mellitus (HCC)     Past Surgical History:  Procedure Laterality Date   APPENDECTOMY     COLONOSCOPY     DILATATION & CURETTAGE/HYSTEROSCOPY WITH MYOSURE N/A 02/25/2015   Procedure: DILATATION & CURETTAGE/HYSTEROSCOPY WITH MYOSURE;  Surgeon: [name], MD;  Location: [facility];  Service: Gynecology;  Laterality: N/A;   DILATION AND CURETTAGE OF UTERUS     ENUCLEATION Right 03/16/2017   ENUCLEATION Right 03/16/2017   Procedure: ENUCLEATION RIGHT EYE;  Surgeon: [name], MD;  Location: [facility] OR;  Service: Ophthalmology;  Laterality: Right;   EYE SURGERY     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6, 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   EYE SURGERY Right ~ 1974   "S/P initial eye injury; scissors stuck in my eye""    LAPAROSCOPIC CHOLECYSTECTOMY     SHOULDER ARTHROSCOPY WITH ROTATOR CUFF REPAIR Left    wire stitches per patient   SHOULDER ARTHROSCOPY WITH ROTATOR CUFF REPAIR Right    Family History:  Family History  Problem Relation Age of Onset   Diabetes Mother    CAD Mother    Lung cancer Father      Social History:  Social History   Substance and Sexual Activity  Alcohol Use Yes   Comment: 03/16/2017 "nothing since 2013"     Social History   Substance and Sexual Activity  Drug Use No    Social History   Socioeconomic History   Marital status: Widowed    Spouse name: Not on file   Number of children: Not on file   Years of education: Not on file   Highest education level: Not on file  Occupational History   Not on file  Tobacco Use   Smoking status: Former    Packs/day: 0.50    Years: 40.00    Pack years: 20.00    Types: Cigarettes    Quit date: 2016    Years since quitting: 7.1   Smokeless tobacco: Never  Vaping Use   Vaping Use: Never used  Substance and Sexual Activity   Alcohol use: Yes    Comment: 03/16/2017 "nothing since 2013"   Drug use: No   Sexual activity: Not  Currently    Birth control/protection: Post-menopausal  Other Topics Concern   Not on file  Social History Narrative   Not on file   Social Determinants of Health   Financial Resource Strain: Not on file  Food Insecurity: Not on file  Transportation Needs: Not on file  Physical Activity: Not on file  Stress: Not on file  Social Connections: Not on file   Additional Social History:                         Sleep: Good  Appetite:  Fair  Current Medications: Current Facility-Administered Medications  Medication Dose Route Frequency Provider Last Rate Last Admin   acetaminophen (TYLENOL) tablet 650 mg  650 mg Oral Q6H PRN Clapacs, John T, MD   650 mg at 01/05/22 2105   alum & mag hydroxide-simeth (MAALOX/MYLANTA) 200-200-20 MG/5ML suspension 30 mL  30 mL Oral Q4H PRN Clapacs, John  T, MD       atenolol (TENORMIN) tablet 12.5 mg  12.5 mg Oral Daily Clapacs, John T, MD   12.5 mg at 01/10/22 2536   atorvastatin (LIPITOR) tablet 40 mg  40 mg Oral Daily Clapacs, John T, MD   40 mg at 01/10/22 6440   divalproex (DEPAKOTE) DR tablet 500 mg  500 mg Oral QHS Clapacs, John T, MD   500 mg at 01/09/22 2101   hydrOXYzine (ATARAX) tablet 50 mg  50 mg Oral Q6H PRN Clapacs, John T, MD       ibuprofen (ADVIL) tablet 800 mg  800 mg Oral TID PRN Clapacs, John T, MD   800 mg at 01/09/22 2102   lisinopril (ZESTRIL) tablet 10 mg  10 mg Oral Daily Clapacs, John T, MD   10 mg at 01/10/22 3474   magnesium hydroxide (MILK OF MAGNESIA) suspension 30 mL  30 mL Oral Daily PRN Clapacs, Jackquline Denmark, MD       methimazole (TAPAZOLE) tablet 10 mg  10 mg Oral Daily Paliy, Serina Cowper, MD   10 mg at 01/10/22 2595   nicotine (NICODERM CQ - dosed in mg/24 hours) patch 21 mg  21 mg Transdermal Q0600 Clapacs, John T, MD       OLANZapine (ZYPREXA) tablet 10 mg  10 mg Oral QHS Clapacs, John T, MD   10 mg at 01/09/22 2101   pantoprazole (PROTONIX) EC tablet 40 mg  40 mg Oral BID AC Clapacs, John T, MD   40 mg at 01/10/22 0900   traZODone (DESYREL) tablet 50 mg  50 mg Oral QHS Clapacs, Jackquline Denmark, MD   50 mg at 01/07/22 2117    Lab Results: No results found for this or any previous visit (from the past 48 hour(s)).  Blood Alcohol level:  Lab Results  Component Value Date   ETH <10 01/04/2022   ETH <10 12/28/2021    Metabolic Disorder Labs: Lab Results  Component Value Date   HGBA1C 6.4 (H) 01/06/2022   MPG 137 01/06/2022   MPG 114.02 10/07/2021   No results found for: PROLACTIN Lab Results  Component Value Date   CHOL 201 (H) 01/06/2022   TRIG 46 01/06/2022   HDL 46 01/06/2022   CHOLHDL 4.4 01/06/2022   VLDL 9 01/06/2022   LDLCALC 146 (H) 01/06/2022   LDLCALC 177 (H) 10/07/2021    Physical Findings: AIMS:  , ,  ,  ,    CIWA:    COWS:     Musculoskeletal: Strength & Muscle Tone:  within normal  limits Gait & Station: normal Patient leans: N/A  Psychiatric Specialty Exam:  Presentation  General Appearance: Appropriate for Environment; Casual  Eye Contact:Good  Speech:Clear and Coherent; Normal Rate  Speech Volume:Normal  Handedness:Right   Mood and Affect  Mood:Euthymic  Affect:Appropriate; Congruent   Thought Process  Thought Processes:Coherent; Linear  Descriptions of Associations:Intact  Orientation:Full (Time, Place and Person)  Thought Content:Logical  History of Schizophrenia/Schizoaffective disorder:Yes  Duration of Psychotic Symptoms:Greater than six months  Hallucinations:No data recorded Ideas of Reference:None  Suicidal Thoughts:No data recorded Homicidal Thoughts:No data recorded  Sensorium  Memory:Immediate Good; Recent Good; Remote Good  Judgment:Good  Insight:Good   Executive Functions  Concentration:Good  Attention Span:Good  Recall:Good  Fund of Knowledge:Good  Language:Good   Psychomotor Activity  Psychomotor Activity:No data recorded  Assets  Assets:Financial Resources/Insurance; Desire for Improvement; Communication Skills; Leisure Time; Physical Health; Resilience   Sleep  Sleep:No data recorded   Physical Exam: Physical Exam Vitals and nursing note reviewed.  Constitutional:      Appearance: Normal appearance. [name] normal weight.  Neurological:     General: No focal deficit present.     Mental Status: [name] alert and oriented to person, place, and time.  Psychiatric:        Attention and Perception: Attention and perception normal.        Mood and Affect: Mood and affect normal.        Speech: Speech normal.        Behavior: Behavior normal. Behavior is cooperative.        Thought Content: Thought content is paranoid.        Cognition and Memory: Cognition and memory normal.        Judgment: Judgment normal.   Review of Systems  Constitutional: Negative.   HENT: Negative.    Eyes: Negative.    Respiratory: Negative.    Cardiovascular: Negative.   Gastrointestinal: Negative.   Genitourinary: Negative.   Musculoskeletal: Negative.   Skin: Negative.   Neurological: Negative.   Endo/Heme/Allergies: Negative.   Psychiatric/Behavioral: Negative.    Blood pressure 137/61, pulse 69, temperature 98.3 F (36.8 C), temperature source Oral, resp. rate 20, height 5\' 4"  (1.626 m), weight 53.5 kg, SpO2 100 %. Body mass index is 20.25 kg/m.   Treatment Plan Summary: Daily contact with patient to assess and evaluate symptoms and progress in treatment, Medication management, and Plan continue current medications.  Sarina Ill, DO 01/10/2022, 11:48 AM

## 2022-01-10 NOTE — Plan of Care (Addendum)
Patient presents A&O x 4. Patient affect is flat; behavior withdrawn and isolative.  Denies AVH, SI, HI, depression, anxiety or pain. Reports sleeping well.  VSS.  Patient compliant with all scheduled meds.  Patient spent most of shift in her room except for meals.   Ongoing Q15 minute safety check rounds per unit protocol.  Problem: Education: Goal: Knowledge of [place] [facility] Education information/materials will improve Outcome: Progressing Goal: Emotional status will improve Outcome: Progressing Goal: Mental status will improve Outcome: Progressing Goal: Verbalization of understanding the information provided will improve Outcome: Progressing   Problem: Health Behavior/Discharge Planning: Goal: Compliance with treatment plan for underlying cause of condition will improve Outcome: Progressing

## 2022-01-10 NOTE — Progress Notes (Signed)
Recreation Therapy Notes  Date: 01/10/2022  Time: 1:25 PM    Location: Courtyard     Behavioral response: N/A   Intervention Topic: Leisure   Discussion/Intervention: Patient refused to attend group.   Clinical Observations/Feedback:  Patient refused to attend group.   Marcia Lepera LRT/CTRS        Calyssa Zobrist 01/10/2022 3:37 PM

## 2022-01-10 NOTE — Progress Notes (Signed)
Patient remains isolative to her room this shift. Refused HS trazodone stating " I don't need it I can sleep without medication" Denies SI/HI/A/VH and verbally contracted for safety. Q 15 minutes safety checks ongoing. Patient remains safe.  Support and encouragement provided.

## 2022-01-11 DIAGNOSIS — F250 Schizoaffective disorder, bipolar type: Secondary | ICD-10-CM | POA: Diagnosis not present

## 2022-01-11 NOTE — Plan of Care (Signed)
Pt self isolative this shift and remained in her room except during snack time, when she came out to get some snacks. Pt promptly returned to her room thereafter. Pt denies SI/HI/AVH.  Pt reports that she is doing "ok" today and did not need anything at the moment. Pt  given emotional support. Pt cooperative and med compliant. Q15 min safety checks maintained.  Problem: Education: Goal: Knowledge of [place] [facility] Education information/materials will improve Outcome: Progressing Goal: Emotional status will improve Outcome: Progressing Goal: Mental status will improve Outcome: Progressing Goal: Verbalization of understanding the information provided will improve Outcome: Progressing   Problem: Activity: Goal: Interest or engagement in activities will improve Outcome: Progressing Goal: Sleeping patterns will improve Outcome: Progressing   Problem: Coping: Goal: Ability to verbalize frustrations and anger appropriately will improve Outcome: Progressing Goal: Ability to demonstrate self-control will improve Outcome: Progressing   Problem: Health Behavior/Discharge Planning: Goal: Identification of resources available to assist in meeting health care needs will improve Outcome: Progressing Goal: Compliance with treatment plan for underlying cause of condition will improve Outcome: Progressing   Problem: Physical Regulation: Goal: Ability to maintain clinical measurements within normal limits will improve Outcome: Progressing   Problem: Safety: Goal: Periods of time without injury will increase Outcome: Progressing

## 2022-01-11 NOTE — Progress Notes (Signed)
Recreation Therapy Notes   Date: 01/11/2022  Time: 1:20 pm    Location: Craft room   Behavioral response: N/A   Intervention Topic: Goals    Discussion/Intervention: Patient refused to attend group.   Clinical Observations/Feedback:  Patient refused to attend group.   Marcela Alatorre LRT/CTRS        Clee Pandit 01/11/2022 3:18 PM

## 2022-01-11 NOTE — Plan of Care (Signed)
  Problem: Group Participation Goal: STG - Patient will engage in groups without prompting or encouragement from LRT x3 group sessions within 5 recreation therapy group sessions Description: STG - Patient will engage in groups without prompting or encouragement from LRT x3 group sessions within 5 recreation therapy group sessions Outcome: Not Progressing   

## 2022-01-11 NOTE — Plan of Care (Signed)
Patient is alert and oriented, calm and cooperative. Denies anxiety and depression. Denies SI, HI, AVH. Complains of leg pain rating 7/10. Medicated with PRN Tylenol. Ate breakfast in the day room among peers with good appetite. Spent most of most of the shift in her come out for meals. Remain safe on the unit with q15 minute safety checks.  Problem: Education: Goal: Knowledge of [place] [facility] Education information/materials will improve Outcome: Progressing Goal: Emotional status will improve Outcome: Progressing Goal: Mental status will improve Outcome: Progressing Goal: Verbalization of understanding the information provided will improve Outcome: Progressing   Problem: Activity: Goal: Interest or engagement in activities will improve Outcome: Progressing Goal: Sleeping patterns will improve Outcome: Progressing   Problem: Coping: Goal: Ability to verbalize frustrations and anger appropriately will improve Outcome: Progressing Goal: Ability to demonstrate self-control will improve Outcome: Progressing   Problem: Health Behavior/Discharge Planning: Goal: Identification of resources available to assist in meeting health care needs will improve Outcome: Progressing Goal: Compliance with treatment plan for underlying cause of condition will improve Outcome: Progressing   Problem: Physical Regulation: Goal: Ability to maintain clinical measurements within normal limits will improve Outcome: Progressing   Problem: Safety: Goal: Periods of time without injury will increase Outcome: Progressing

## 2022-01-11 NOTE — BH IP Treatment Plan (Signed)
Interdisciplinary Treatment and Diagnostic Plan Update  01/11/2022 Time of Session: 10:00AM Shari [name] MRN: [id number]  Principal Diagnosis: Schizoaffective disorder, bipolar type [facility])  Secondary Diagnoses: Principal Problem:   Schizoaffective disorder, bipolar type (HCC) Active Problems:   Schizophrenia (HCC)   Current Medications:  Current Facility-Administered Medications  Medication Dose Route Frequency Provider Last Rate Last Admin   acetaminophen (TYLENOL) tablet 650 mg  650 mg Oral Q6H PRN Clapacs, Jackquline Denmark, MD   650 mg at 01/11/22 0748   alum & mag hydroxide-simeth (MAALOX/MYLANTA) 200-200-20 MG/5ML suspension 30 mL  30 mL Oral Q4H PRN Clapacs, Jackquline Denmark, MD       atenolol (TENORMIN) tablet 12.5 mg  12.5 mg Oral Daily Clapacs, John T, MD   12.5 mg at 01/11/22 4098   atorvastatin (LIPITOR) tablet 40 mg  40 mg Oral Daily Clapacs, Jackquline Denmark, MD   40 mg at 01/11/22 1191   divalproex (DEPAKOTE) DR tablet 500 mg  500 mg Oral QHS Clapacs, John T, MD   500 mg at 01/10/22 2123   hydrOXYzine (ATARAX) tablet 50 mg  50 mg Oral Q6H PRN Clapacs, Jackquline Denmark, MD       ibuprofen (ADVIL) tablet 800 mg  800 mg Oral TID PRN Clapacs, Jackquline Denmark, MD   800 mg at 01/09/22 2102   lisinopril (ZESTRIL) tablet 10 mg  10 mg Oral Daily Clapacs, Jackquline Denmark, MD   10 mg at 01/11/22 4782   magnesium hydroxide (MILK OF MAGNESIA) suspension 30 mL  30 mL Oral Daily PRN Clapacs, Jackquline Denmark, MD       methimazole (TAPAZOLE) tablet 10 mg  10 mg Oral Daily Thalia Party, MD   10 mg at 01/11/22 9562   nicotine (NICODERM CQ - dosed in mg/24 hours) patch 21 mg  21 mg Transdermal Q0600 Clapacs, Jackquline Denmark, MD       OLANZapine (ZYPREXA) tablet 10 mg  10 mg Oral QHS Clapacs, John T, MD   10 mg at 01/10/22 2123   pantoprazole (PROTONIX) EC tablet 40 mg  40 mg Oral BID AC Clapacs, John T, MD   40 mg at 01/11/22 0742   traZODone (DESYREL) tablet 50 mg  50 mg Oral QHS PRN Sarina Ill, DO       PTA Medications: Medications Prior to Admission   Medication Sig Dispense Refill Last Dose   amantadine (SYMMETREL) 100 MG capsule Take 1 capsule (100 mg total) by mouth 2 (two) times daily. (Patient not taking: Reported on 12/28/2021) 60 capsule 0    atenolol (TENORMIN) 25 MG tablet Take 0.5 tablets (12.5 mg total) by mouth daily. 15 tablet 0    atorvastatin (LIPITOR) 40 MG tablet Take 1 tablet (40 mg total) by mouth daily. 30 tablet 0    divalproex (DEPAKOTE) 500 MG DR tablet Take 1 tablet (500 mg total) by mouth at bedtime. (Patient not taking: Reported on 01/04/2022) 30 tablet 2    ibuprofen (ADVIL) 200 MG tablet Take 800 mg by mouth See admin instructions. Take 800 mg by mouth two to three times a day as needed for pain      lisinopril (ZESTRIL) 10 MG tablet Take 1 tablet (10 mg total) by mouth daily. 30 tablet 0    methimazole (TAPAZOLE) 10 MG tablet Take 1 tablet (10 mg total) by mouth daily. 30 tablet 0    OLANZapine (ZYPREXA) 10 MG tablet Take 1 tablet (10 mg total) by mouth at bedtime. (Patient not taking: Reported on 01/04/2022) 30  tablet 2    pantoprazole (PROTONIX) 40 MG tablet Take 1 tablet (40 mg total) by mouth 2 (two) times daily before a meal. 60 tablet 0    traZODone (DESYREL) 50 MG tablet Take 1 tablet (50 mg total) by mouth at bedtime. (Patient not taking: Reported on 01/04/2022) 30 tablet 2     Patient Stressors: Marital or family conflict    Patient Strengths: Forensic psychologist fund of knowledge  Physical Health   Treatment Modalities: Medication Management, Group therapy, Case management,  1 to 1 session with clinician, Psychoeducation, Recreational therapy.   Physician Treatment Plan for Primary Diagnosis: Schizoaffective disorder, bipolar type (HCC) Long Term Goal(s): Improvement in symptoms so as ready for discharge   Short Term Goals: Ability to identify changes in lifestyle to reduce recurrence of condition will improve Ability to verbalize feelings will improve Ability to disclose and discuss  suicidal ideas Ability to demonstrate self-control will improve Ability to identify and develop effective coping behaviors will improve Ability to maintain clinical measurements within normal limits will improve Compliance with prescribed medications will improve Ability to identify triggers associated with substance abuse/mental health issues will improve  Medication Management: Evaluate patient's response, side effects, and tolerance of medication regimen.  Therapeutic Interventions: 1 to 1 sessions, Unit Group sessions and Medication administration.  Evaluation of Outcomes: Progressing  Physician Treatment Plan for Secondary Diagnosis: Principal Problem:   Schizoaffective disorder, bipolar type (HCC) Active Problems:   Schizophrenia (HCC)  Long Term Goal(s): Improvement in symptoms so as ready for discharge   Short Term Goals: Ability to identify changes in lifestyle to reduce recurrence of condition will improve Ability to verbalize feelings will improve Ability to disclose and discuss suicidal ideas Ability to demonstrate self-control will improve Ability to identify and develop effective coping behaviors will improve Ability to maintain clinical measurements within normal limits will improve Compliance with prescribed medications will improve Ability to identify triggers associated with substance abuse/mental health issues will improve     Medication Management: Evaluate patient's response, side effects, and tolerance of medication regimen.  Therapeutic Interventions: 1 to 1 sessions, Unit Group sessions and Medication administration.  Evaluation of Outcomes: Progressing   RN Treatment Plan for Primary Diagnosis: Schizoaffective disorder, bipolar type (HCC) Long Term Goal(s): Knowledge of disease and therapeutic regimen to maintain health will improve  Short Term Goals: Ability to remain free from injury will improve, Ability to verbalize frustration and anger  appropriately will improve, Ability to demonstrate self-control, Ability to participate in decision making will improve, Ability to verbalize feelings will improve, and Ability to identify and develop effective coping behaviors will improve  Medication Management: RN will administer medications as ordered by provider, will assess and evaluate patient's response and provide education to patient for prescribed medication. RN will report any adverse and/or side effects to prescribing provider.  Therapeutic Interventions: 1 on 1 counseling sessions, Psychoeducation, Medication administration, Evaluate responses to treatment, Monitor vital signs and CBGs as ordered, Perform/monitor CIWA, COWS, AIMS and Fall Risk screenings as ordered, Perform wound care treatments as ordered.  Evaluation of Outcomes: Progressing   LCSW Treatment Plan for Primary Diagnosis: Schizoaffective disorder, bipolar type (HCC) Long Term Goal(s): Safe transition to appropriate next level of care at discharge, Engage patient in therapeutic group addressing interpersonal concerns.  Short Term Goals: Engage patient in aftercare planning with referrals and resources, Increase social support, Increase ability to appropriately verbalize feelings, Increase emotional regulation, Facilitate acceptance of mental health diagnosis and concerns, Identify triggers  associated with mental health/substance abuse issues, and Increase skills for wellness and recovery  Therapeutic Interventions: Assess for all discharge needs, 1 to 1 time with Social worker, Explore available resources and support systems, Assess for adequacy in community support network, Educate family and significant other(s) on suicide prevention, Complete Psychosocial Assessment, Interpersonal group therapy.  Evaluation of Outcomes: Progressing   Progress in Treatment: Attending groups: No. Participating in groups: No. Taking medication as prescribed: Yes. Toleration  medication: Yes. Family/Significant other contact made: Yes, individual(s) contacted:  pt's niece Patient understands diagnosis: No. Discussing patient identified problems/goals with staff: No. Medical problems stabilized or resolved: Yes. Denies suicidal/homicidal ideation: Yes. Issues/concerns per patient self-inventory: No. Other: None  New problem(s) identified: No, Describe:  None  New Short Term/Long Term Goal(s): Patient to work towards dedication management for mood stabilization;  development of comprehensive mental wellness/sobriety plan. Update 01/11/22: No changes at this time.      Patient Goals:  "don't know what, if I find anything to work on, I'll do that" Update 01/11/22: No changes at this time.    Discharge Plan or Barriers: CSW will assist pt with development of an appropriate discharge/aftercare plan. Update 01/11/22: No changes at this time.    Reason for Continuation of Hospitalization: Medication stabilization   Estimated Length of Stay: TBD  Scribe for Treatment Team: Alfons Sulkowski A [place], Theresia Majors 01/11/2022 2:19 PM

## 2022-01-11 NOTE — BHH Counselor (Signed)
CSW spoke with sister [name], [phone] who says sister cannot come back to live with her.   She said that pt stays with family members, and threatens and mistreats them, does not bathe or care for herself regularly and ultimately does not want help. She said that she would consider pt returning to family is she had insight into her mental health and was seeking help.   She stated that pt was at [facility], about August/September of last year, and may have worked with a  Child psychotherapist named Librarian, academic.   She said family was potentially working on gaining guardianship, however due to patients benign behavior with others, they said it may not work. [name] talking with a Child psychotherapist at [employer] on Thursday.   No other requests were made, conversation ended without incident.     Margarito Dehaas [place], MSW, LCSW-A 2/7/20232:09 PM

## 2022-01-11 NOTE — Progress Notes (Signed)
[facility] MD Progress Note  01/11/2022 12:32 PM Shari [name]  MRN:  [id number] Subjective: [name] has been taking her medications as prescribed and denies any side effects.  Currently, she does not have a place to live.  Social work is working on that.  She remains isolative and withdrawn to her bedroom.  [name] eating and states that she is sleeping well.  She denies any auditory or visual hallucinations.  Principal Problem: Schizoaffective disorder, bipolar type (HCC) Diagnosis: Principal Problem:   Schizoaffective disorder, bipolar type (HCC) Active Problems:   Schizophrenia (HCC)  Total Time spent with patient: 15 minutes  Past Psychiatric History: yes  Past Medical History:  Past Medical History:  Diagnosis Date   Eye globe prosthesis    GERD (gastroesophageal reflux disease)    History of blood transfusion 1973   "related to abscess burst in my stomach"   Hyperlipemia    Hyperlipidemia    Hypertension    Osteoarthritis of back    Lowerback    SVD (spontaneous vaginal delivery)    x 1, baby died at 2 wks of age   Type II diabetes mellitus (HCC)     Past Surgical History:  Procedure Laterality Date   APPENDECTOMY     COLONOSCOPY     DILATATION & CURETTAGE/HYSTEROSCOPY WITH MYOSURE N/A 02/25/2015   Procedure: DILATATION & CURETTAGE/HYSTEROSCOPY WITH MYOSURE;  Surgeon: [name], MD;  Location: [facility];  Service: Gynecology;  Laterality: N/A;   DILATION AND CURETTAGE OF UTERUS     ENUCLEATION Right 03/16/2017   ENUCLEATION Right 03/16/2017   Procedure: ENUCLEATION RIGHT EYE;  Surgeon: [name], MD;  Location: [facility] OR;  Service: Ophthalmology;  Laterality: Right;   EYE SURGERY     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6, 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   EYE SURGERY Right ~ 1974   "S/P initial eye injury; scissors stuck in my eye""   LAPAROSCOPIC CHOLECYSTECTOMY     SHOULDER ARTHROSCOPY WITH ROTATOR CUFF REPAIR Left    wire stitches per patient   SHOULDER ARTHROSCOPY WITH ROTATOR CUFF REPAIR  Right    Family History:  Family History  Problem Relation Age of Onset   Diabetes Mother    CAD Mother    Lung cancer Father     Social History:  Social History   Substance and Sexual Activity  Alcohol Use Yes   Comment: 03/16/2017 "nothing since 2013"     Social History   Substance and Sexual Activity  Drug Use No    Social History   Socioeconomic History   Marital status: Widowed    Spouse name: Not on file   Number of children: Not on file   Years of education: Not on file   Highest education level: Not on file  Occupational History   Not on file  Tobacco Use   Smoking status: Former    Packs/day: 0.50    Years: 40.00    Pack years: 20.00    Types: Cigarettes    Quit date: 2016    Years since quitting: 7.1   Smokeless tobacco: Never  Vaping Use   Vaping Use: Never used  Substance and Sexual Activity   Alcohol use: Yes    Comment: 03/16/2017 "nothing since 2013"   Drug use: No   Sexual activity: Not Currently    Birth control/protection: Post-menopausal  Other Topics Concern   Not on file  Social History Narrative   Not on file   Social Determinants of Health  Financial Resource Strain: Not on file  Food Insecurity: Not on file  Transportation Needs: Not on file  Physical Activity: Not on file  Stress: Not on file  Social Connections: Not on file   Additional Social History:                         Sleep: Good  Appetite:  Good  Current Medications: Current Facility-Administered Medications  Medication Dose Route Frequency Provider Last Rate Last Admin   acetaminophen (TYLENOL) tablet 650 mg  650 mg Oral Q6H PRN Clapacs, John T, MD   650 mg at 01/11/22 0748   alum & mag hydroxide-simeth (MAALOX/MYLANTA) 200-200-20 MG/5ML suspension 30 mL  30 mL Oral Q4H PRN Clapacs, John T, MD       atenolol (TENORMIN) tablet 12.5 mg  12.5 mg Oral Daily Clapacs, John T, MD   12.5 mg at 01/11/22 8756   atorvastatin (LIPITOR) tablet 40 mg  40 mg  Oral Daily Clapacs, John T, MD   40 mg at 01/11/22 4332   divalproex (DEPAKOTE) DR tablet 500 mg  500 mg Oral QHS Clapacs, John T, MD   500 mg at 01/10/22 2123   hydrOXYzine (ATARAX) tablet 50 mg  50 mg Oral Q6H PRN Clapacs, John T, MD       ibuprofen (ADVIL) tablet 800 mg  800 mg Oral TID PRN Clapacs, John T, MD   800 mg at 01/09/22 2102   lisinopril (ZESTRIL) tablet 10 mg  10 mg Oral Daily Clapacs, John T, MD   10 mg at 01/11/22 9518   magnesium hydroxide (MILK OF MAGNESIA) suspension 30 mL  30 mL Oral Daily PRN Clapacs, Jackquline Denmark, MD       methimazole (TAPAZOLE) tablet 10 mg  10 mg Oral Daily Paliy, Serina Cowper, MD   10 mg at 01/11/22 0925   nicotine (NICODERM CQ - dosed in mg/24 hours) patch 21 mg  21 mg Transdermal Q0600 Clapacs, John T, MD       OLANZapine (ZYPREXA) tablet 10 mg  10 mg Oral QHS Clapacs, John T, MD   10 mg at 01/10/22 2123   pantoprazole (PROTONIX) EC tablet 40 mg  40 mg Oral BID AC Clapacs, John T, MD   40 mg at 01/11/22 0742   traZODone (DESYREL) tablet 50 mg  50 mg Oral QHS PRN Sarina Ill, DO        Lab Results: No results found for this or any previous visit (from the past 48 hour(s)).  Blood Alcohol level:  Lab Results  Component Value Date   ETH <10 01/04/2022   ETH <10 12/28/2021    Metabolic Disorder Labs: Lab Results  Component Value Date   HGBA1C 6.4 (H) 01/06/2022   MPG 137 01/06/2022   MPG 114.02 10/07/2021   No results found for: PROLACTIN Lab Results  Component Value Date   CHOL 201 (H) 01/06/2022   TRIG 46 01/06/2022   HDL 46 01/06/2022   CHOLHDL 4.4 01/06/2022   VLDL 9 01/06/2022   LDLCALC 146 (H) 01/06/2022   LDLCALC 177 (H) 10/07/2021    Physical Findings: AIMS:  , ,  ,  ,    CIWA:    COWS:     Musculoskeletal: Strength & Muscle Tone: within normal limits Gait & Station: normal Patient leans: N/A  Psychiatric Specialty Exam:  Presentation  General Appearance: Appropriate for Environment; Casual  Eye  Contact:Good  Speech:Clear and Coherent; Normal Rate  Speech  Volume:Normal  Handedness:Right   Mood and Affect  Mood:Euthymic  Affect:Appropriate; Congruent   Thought Process  Thought Processes:Coherent; Linear  Descriptions of Associations:Intact  Orientation:Full (Time, Place and Person)  Thought Content:Logical  History of Schizophrenia/Schizoaffective disorder:Yes  Duration of Psychotic Symptoms:Greater than six months  Hallucinations:No data recorded Ideas of Reference:None  Suicidal Thoughts:No data recorded Homicidal Thoughts:No data recorded  Sensorium  Memory:Immediate Good; Recent Good; Remote Good  Judgment:Good  Insight:Good   Executive Functions  Concentration:Good  Attention Span:Good  Recall:Good  Fund of Knowledge:Good  Language:Good   Psychomotor Activity  Psychomotor Activity:No data recorded  Assets  Assets:Financial Resources/Insurance; Desire for Improvement; Communication Skills; Leisure Time; Physical Health; Resilience   Sleep  Sleep:No data recorded   Physical Exam: Physical Exam Vitals and nursing note reviewed.  Constitutional:      Appearance: Normal appearance. [name] normal weight.  Neurological:     General: No focal deficit present.     Mental Status: [name] alert and oriented to person, place, and time.  Psychiatric:        Attention and Perception: Attention and perception normal.        Mood and Affect: Mood is anxious. Affect is flat.        Speech: Speech normal.        Behavior: Behavior normal. Behavior is cooperative.        Thought Content: Thought content is paranoid.        Cognition and Memory: Cognition and memory normal.        Judgment: Judgment normal.   Review of Systems  Constitutional: Negative.   HENT: Negative.    Eyes: Negative.   Respiratory: Negative.    Cardiovascular: Negative.   Gastrointestinal: Negative.   Genitourinary: Negative.   Musculoskeletal: Negative.   Skin:  Negative.   Neurological: Negative.   Endo/Heme/Allergies: Negative.   Psychiatric/Behavioral: Negative.    Blood pressure (!) 157/85, pulse 82, temperature 98.3 F (36.8 C), temperature source Oral, resp. rate 18, height 5\' 4"  (1.626 m), weight 53.5 kg, SpO2 97 %. Body mass index is 20.25 kg/m.   Treatment Plan Summary: Daily contact with patient to assess and evaluate symptoms and progress in treatment, Medication management, and Plan continue current medications.  Sarina Ill, DO 01/11/2022, 12:32 PM

## 2022-01-12 DIAGNOSIS — F250 Schizoaffective disorder, bipolar type: Secondary | ICD-10-CM | POA: Diagnosis not present

## 2022-01-12 NOTE — Progress Notes (Signed)
[facility] MD Progress Note  01/12/2022 11:14 AM Shari [name]  MRN:  [id number] Subjective: Shari [name] continues to do well on her current medications.  She remains mostly isolative to her room.  Social work is still trying to figure out where she will be discharged to them.  Shari [name] currently denies any auditory or visual hallucinations.  She also denies any suicidal or homicidal ideation.  She has no place to go at this time.  Principal Problem: Schizoaffective disorder, bipolar type ([place]) Diagnosis: Principal Problem:   Schizoaffective disorder, bipolar type ([place]) Active Problems:   Schizophrenia ([place])  Total Time spent with patient: 15 minutes  Past Psychiatric History: yes  Past Medical History:  Past Medical History:  Diagnosis Date   Eye globe prosthesis    GERD (gastroesophageal reflux disease)    History of blood transfusion 1973   "related to abscess burst in my stomach"   Hyperlipemia    Hyperlipidemia    Hypertension    Osteoarthritis of back    Lowerback    SVD (spontaneous vaginal delivery)    x 1, baby died at 2 wks of age   Type II diabetes mellitus ([place])     Past Surgical History:  Procedure Laterality Date   APPENDECTOMY     COLONOSCOPY     DILATATION & CURETTAGE/HYSTEROSCOPY WITH MYOSURE N/A 02/25/2015   Procedure: DILATATION & CURETTAGE/HYSTEROSCOPY WITH MYOSURE;  Surgeon: Marylynn Pearson, MD;  Location: [facility];  Service: Gynecology;  Laterality: N/A;   DILATION AND CURETTAGE OF UTERUS     ENUCLEATION Right 03/16/2017   ENUCLEATION Right 03/16/2017   Procedure: ENUCLEATION RIGHT EYE;  Surgeon: Clista Bernhardt, MD;  Location: [place];  Service: Ophthalmology;  Laterality: Right;   EYE SURGERY     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6, 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   EYE SURGERY Right ~ 1974   "S/P initial eye injury; scissors stuck in my eye""   LAPAROSCOPIC CHOLECYSTECTOMY     SHOULDER ARTHROSCOPY WITH ROTATOR CUFF REPAIR Left    wire stitches per patient   SHOULDER ARTHROSCOPY WITH ROTATOR  CUFF REPAIR Right    Family History:  Family History  Problem Relation Age of Onset   Diabetes Mother    CAD Mother    Lung cancer Father     Social History:  Social History   Substance and Sexual Activity  Alcohol Use Yes   Comment: 03/16/2017 "nothing since 2013"     Social History   Substance and Sexual Activity  Drug Use No    Social History   Socioeconomic History   Marital status: Widowed    Spouse name: Not on file   Number of children: Not on file   Years of education: Not on file   Highest education level: Not on file  Occupational History   Not on file  Tobacco Use   Smoking status: Former    Packs/day: 0.50    Years: 40.00    Pack years: 20.00    Types: Cigarettes    Quit date: 2016    Years since quitting: 7.1   Smokeless tobacco: Never  Vaping Use   Vaping Use: Never used  Substance and Sexual Activity   Alcohol use: Yes    Comment: 03/16/2017 "nothing since 2013"   Drug use: No   Sexual activity: Not Currently    Birth control/protection: Post-menopausal  Other Topics Concern   Not on file  Social History Narrative   Not on file   Social  Determinants of Health   Financial Resource Strain: Not on file  Food Insecurity: Not on file  Transportation Needs: Not on file  Physical Activity: Not on file  Stress: Not on file  Social Connections: Not on file   Additional Social History:                         Sleep: Good  Appetite:  Fair  Current Medications: Current Facility-Administered Medications  Medication Dose Route Frequency Provider Last Rate Last Admin   acetaminophen (TYLENOL) tablet 650 mg  650 mg Oral Q6H PRN Clapacs, John T, MD   650 mg at 01/11/22 2208   alum & mag hydroxide-simeth (MAALOX/MYLANTA) 200-200-20 MG/5ML suspension 30 mL  30 mL Oral Q4H PRN Clapacs, John T, MD       atenolol (TENORMIN) tablet 12.5 mg  12.5 mg Oral Daily Clapacs, John T, MD   12.5 mg at 01/12/22 [id number]   atorvastatin (LIPITOR) tablet 40  mg  40 mg Oral Daily Clapacs, John T, MD   40 mg at 01/12/22 [id number]   divalproex (DEPAKOTE) DR tablet 500 mg  500 mg Oral QHS Clapacs, John T, MD   500 mg at 01/11/22 2150   hydrOXYzine (ATARAX) tablet 50 mg  50 mg Oral Q6H PRN Clapacs, John T, MD       ibuprofen (ADVIL) tablet 800 mg  800 mg Oral TID PRN Clapacs, John T, MD   800 mg at 01/09/22 2102   lisinopril (ZESTRIL) tablet 10 mg  10 mg Oral Daily Clapacs, John T, MD   10 mg at 01/12/22 [id number]   magnesium hydroxide (MILK OF MAGNESIA) suspension 30 mL  30 mL Oral Daily PRN Clapacs, Madie Reno, MD       methimazole (TAPAZOLE) tablet 10 mg  10 mg Oral Daily Paliy, Delrae Rend, MD   10 mg at 01/12/22 0905   nicotine (NICODERM CQ - dosed in mg/24 hours) patch 21 mg  21 mg Transdermal Q0600 Clapacs, John T, MD       OLANZapine (ZYPREXA) tablet 10 mg  10 mg Oral QHS Clapacs, John T, MD   10 mg at 01/11/22 2151   pantoprazole (PROTONIX) EC tablet 40 mg  40 mg Oral BID AC Clapacs, John T, MD   40 mg at 01/12/22 0855   traZODone (DESYREL) tablet 50 mg  50 mg Oral QHS PRN Parks Ranger, DO        Lab Results: No results found for this or any previous visit (from the past 22 hour(s)).  Blood Alcohol level:  Lab Results  Component Value Date   ETH <10 01/04/2022   ETH <10 123456    Metabolic Disorder Labs: Lab Results  Component Value Date   HGBA1C 6.4 (H) 01/06/2022   MPG 137 01/06/2022   MPG 114.02 10/07/2021   No results found for: PROLACTIN Lab Results  Component Value Date   CHOL 201 (H) 01/06/2022   TRIG 46 01/06/2022   HDL 46 01/06/2022   CHOLHDL 4.4 01/06/2022   VLDL 9 01/06/2022   LDLCALC 146 (H) 01/06/2022   LDLCALC 177 (H) 10/07/2021    Physical Findings: AIMS:  , ,  ,  ,    CIWA:    COWS:     Musculoskeletal: Strength & Muscle Tone: within normal limits Gait & Station: normal Patient leans: N/A  Psychiatric Specialty Exam:  Presentation  General Appearance: Appropriate for Environment; Casual  Eye  Contact:Good  Speech:Clear and  Coherent; Normal Rate  Speech Volume:Normal  Handedness:Right   Mood and Affect  Mood:Euthymic  Affect:Appropriate; Congruent   Thought Process  Thought Processes:Coherent; Linear  Descriptions of Associations:Intact  Orientation:Full (Time, Place and Person)  Thought Content:Logical  History of Schizophrenia/Schizoaffective disorder:Yes  Duration of Psychotic Symptoms:Greater than six months  Hallucinations:No data recorded Ideas of Reference:None  Suicidal Thoughts:No data recorded Homicidal Thoughts:No data recorded  Sensorium  Memory:Immediate Good; Recent Good; Remote Good  Judgment:Good  Insight:Good   Executive Functions  Concentration:Good  Attention Span:Good  [place]  Language:Good   Psychomotor Activity  Psychomotor Activity:No data recorded  Assets  Assets:Financial Resources/Insurance; Desire for Improvement; Communication Skills; Leisure Time; Physical Health; Resilience   Sleep  Sleep:No data recorded   Physical Exam: Physical Exam Vitals and nursing note reviewed.  Constitutional:      Appearance: Normal appearance. [name] normal weight.  Neurological:     General: No focal deficit present.     Mental Status: [name] alert and oriented to person, place, and time.  Psychiatric:        Attention and Perception: Attention and perception normal.        Mood and Affect: Mood is anxious.        Speech: Speech normal.        Behavior: Behavior is withdrawn.        Thought Content: Thought content normal.        Cognition and Memory: Cognition and memory normal.        Judgment: Judgment normal.   Review of Systems  Psychiatric/Behavioral:  Positive for depression. The patient is nervous/anxious.   Blood pressure 126/75, pulse 61, temperature 97.6 F (36.4 C), temperature source Oral, resp. rate 18, height 5\' 4"  (1.626 m), weight 53.5 kg, SpO2 100 %. Body mass index  is 20.25 kg/m.   Treatment Plan Summary: Daily contact with patient to assess and evaluate symptoms and progress in treatment, Medication management, and Plan continue current medications.  [place], DO 01/12/2022, 11:14 AM

## 2022-01-12 NOTE — Group Note (Signed)
[facility] LCSW Group Therapy Note ° ° ° °Group Date: 01/12/2022 °Start Time: 1405 °End Time: 1500 ° °Type of Therapy and Topic:  Group Therapy:  Overcoming Obstacles ° °Participation Level:  [facility] PARTICIPATION LEVEL: Did Not Attend ° ° ° °Description of Group:   °In this group patients will be encouraged to explore what they see as obstacles to their own wellness and recovery. They will be guided to discuss their thoughts, feelings, and behaviors related to these obstacles. The group will process together ways to cope with barriers, with attention given to specific choices patients can make. Each patient will be challenged to identify changes they are motivated to make in order to overcome their obstacles. This group will be process-oriented, with patients participating in exploration of their own experiences as well as giving and receiving support and challenge from other group members. ° °Therapeutic Goals: °1. Patient will identify personal and current obstacles as they relate to admission. °2. Patient will identify barriers that currently interfere with their wellness or overcoming obstacles.  °3. Patient will identify feelings, thought process and behaviors related to these barriers. °4. Patient will identify two changes they are willing to make to overcome these obstacles:  ° ° °Summary of Patient Progress ° ° °X ° ° °Therapeutic Modalities:   °Cognitive Behavioral Therapy °Solution Focused Therapy °Motivational Interviewing °Relapse Prevention Therapy ° ° °Shannyn Jankowiak A Reyli Schroth, LCSWA °

## 2022-01-12 NOTE — Progress Notes (Signed)
Pt standing at nurses' station; calm, cooperative. Pt states "I'm doing okay." Pt c/o pain in her BLE, which she rates 7/10 on 0-10 pain scale. Pt denies SI/HI/AVH at this time. She reports that she sleeps "good" and describes her appetite as "okay". She denies anxiety and depression at this time. No acute distress noted.

## 2022-01-12 NOTE — Plan of Care (Signed)
Patient is alert and oriented, calm and cooperative. Denies any pain, anxiety and depression. Denies SI, HI, AVH. Complains of leg pain but states it does not hurt right now. Ate breakfast in the day room among peers with good appetite. Spent most of the shift in her room. Comes out to day room for meals. Q15 minute safety checks maintained. Will continue to monitor.  Problem: Education: Goal: Knowledge of [place] [facility] Education information/materials will improve Outcome: Progressing Goal: Emotional status will improve Outcome: Progressing Goal: Mental status will improve Outcome: Progressing Goal: Verbalization of understanding the information provided will improve Outcome: Progressing   Problem: Activity: Goal: Interest or engagement in activities will improve Outcome: Progressing Goal: Sleeping patterns will improve Outcome: Progressing   Problem: Coping: Goal: Ability to verbalize frustrations and anger appropriately will improve Outcome: Progressing Goal: Ability to demonstrate self-control will improve Outcome: Progressing   Problem: Health Behavior/Discharge Planning: Goal: Identification of resources available to assist in meeting health care needs will improve Outcome: Progressing Goal: Compliance with treatment plan for underlying cause of condition will improve Outcome: Progressing   Problem: Physical Regulation: Goal: Ability to maintain clinical measurements within normal limits will improve Outcome: Progressing   Problem: Safety: Goal: Periods of time without injury will increase Outcome: Progressing

## 2022-01-13 DIAGNOSIS — F250 Schizoaffective disorder, bipolar type: Secondary | ICD-10-CM | POA: Diagnosis not present

## 2022-01-13 NOTE — Progress Notes (Signed)
[facility] MD Progress Note  01/13/2022 10:37 AM Shari [name]  MRN:  [id number] Subjective: Shari [name] is not allowed to return home to live with her niece.  She states that she does not get along with any of her family including her sister.  She states they just want her to go away.  Not really sure what the problem is but right now social work is looking for a place for her to live.  [name] taking her medications as prescribed and denies any side effects.  There is no evidence of EPS or TD.  She states that she is sleeping well.  She denies all signs and symptoms.  Principal Problem: Schizoaffective disorder, bipolar type ([facility]) Diagnosis: Principal Problem:   Schizoaffective disorder, bipolar type ([facility]) Active Problems:   Schizophrenia ([facility])  Total Time spent with patient: 15 minutes  Past Psychiatric History: yes  Past Medical History:  Past Medical History:  Diagnosis Date   Eye globe prosthesis    GERD (gastroesophageal reflux disease)    History of blood transfusion 1973   "related to abscess burst in my stomach"   Hyperlipemia    Hyperlipidemia    Hypertension    Osteoarthritis of back    Lowerback    SVD (spontaneous vaginal delivery)    x 1, baby died at 2 wks of age   Type II diabetes mellitus ([facility])     Past Surgical History:  Procedure Laterality Date   APPENDECTOMY     COLONOSCOPY     DILATATION & CURETTAGE/HYSTEROSCOPY WITH MYOSURE N/A 02/25/2015   Procedure: DILATATION & CURETTAGE/HYSTEROSCOPY WITH MYOSURE;  Surgeon: [name], MD;  Location: [facility];  Service: Gynecology;  Laterality: N/A;   DILATION AND CURETTAGE OF UTERUS     ENUCLEATION Right 03/16/2017   ENUCLEATION Right 03/16/2017   Procedure: ENUCLEATION RIGHT EYE;  Surgeon: [name], MD;  Location: [facility];  Service: Ophthalmology;  Laterality: Right;   EYE SURGERY     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6, 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   EYE SURGERY Right ~ 1974   "S/P initial eye injury; scissors stuck in my eye""    LAPAROSCOPIC CHOLECYSTECTOMY     SHOULDER ARTHROSCOPY WITH ROTATOR CUFF REPAIR Left    wire stitches per patient   SHOULDER ARTHROSCOPY WITH ROTATOR CUFF REPAIR Right    Family History:  Family History  Problem Relation Age of Onset   Diabetes Mother    CAD Mother    Lung cancer Father     Social History:  Social History   Substance and Sexual Activity  Alcohol Use Yes   Comment: 03/16/2017 "nothing since 2013"     Social History   Substance and Sexual Activity  Drug Use No    Social History   Socioeconomic History   Marital status: Widowed    Spouse name: Not on file   Number of children: Not on file   Years of education: Not on file   Highest education level: Not on file  Occupational History   Not on file  Tobacco Use   Smoking status: Former    Packs/day: 0.50    Years: 40.00    Pack years: 20.00    Types: Cigarettes    Quit date: 2016    Years since quitting: 7.1   Smokeless tobacco: Never  Vaping Use   Vaping Use: Never used  Substance and Sexual Activity   Alcohol use: Yes    Comment: 03/16/2017 "nothing since 2013"  Drug use: No   Sexual activity: Not Currently    Birth control/protection: Post-menopausal  Other Topics Concern   Not on file  Social History Narrative   Not on file   Social Determinants of Health   Financial Resource Strain: Not on file  Food Insecurity: Not on file  Transportation Needs: Not on file  Physical Activity: Not on file  Stress: Not on file  Social Connections: Not on file   Additional Social History:                         Sleep: Good  Appetite:  Fair  Current Medications: Current Facility-Administered Medications  Medication Dose Route Frequency Provider Last Rate Last Admin   acetaminophen (TYLENOL) tablet 650 mg  650 mg Oral Q6H PRN Clapacs, John T, MD   650 mg at 01/11/22 2208   alum & mag hydroxide-simeth (MAALOX/MYLANTA) 200-200-20 MG/5ML suspension 30 mL  30 mL Oral Q4H PRN Clapacs, John  T, MD       atenolol (TENORMIN) tablet 12.5 mg  12.5 mg Oral Daily Clapacs, John T, MD   12.5 mg at 01/13/22 1034   atorvastatin (LIPITOR) tablet 40 mg  40 mg Oral Daily Clapacs, John T, MD   40 mg at 01/13/22 1019   divalproex (DEPAKOTE) DR tablet 500 mg  500 mg Oral QHS Clapacs, John T, MD   500 mg at 01/12/22 2115   hydrOXYzine (ATARAX) tablet 50 mg  50 mg Oral Q6H PRN Clapacs, John T, MD       ibuprofen (ADVIL) tablet 800 mg  800 mg Oral TID PRN Clapacs, John T, MD   800 mg at 01/12/22 2115   lisinopril (ZESTRIL) tablet 10 mg  10 mg Oral Daily Clapacs, John T, MD   10 mg at 01/13/22 1034   magnesium hydroxide (MILK OF MAGNESIA) suspension 30 mL  30 mL Oral Daily PRN Clapacs, Jackquline Denmark, MD       methimazole (TAPAZOLE) tablet 10 mg  10 mg Oral Daily Paliy, Alisa, MD   10 mg at 01/13/22 1020   nicotine (NICODERM CQ - dosed in mg/24 hours) patch 21 mg  21 mg Transdermal Q0600 Clapacs, John T, MD       OLANZapine (ZYPREXA) tablet 10 mg  10 mg Oral QHS Clapacs, John T, MD   10 mg at 01/12/22 2115   pantoprazole (PROTONIX) EC tablet 40 mg  40 mg Oral BID AC Clapacs, John T, MD   40 mg at 01/13/22 0803   traZODone (DESYREL) tablet 50 mg  50 mg Oral QHS PRN Sarina Ill, DO        Lab Results: No results found for this or any previous visit (from the past 48 hour(s)).  Blood Alcohol level:  Lab Results  Component Value Date   ETH <10 01/04/2022   ETH <10 12/28/2021    Metabolic Disorder Labs: Lab Results  Component Value Date   HGBA1C 6.4 (H) 01/06/2022   MPG 137 01/06/2022   MPG 114.02 10/07/2021   No results found for: PROLACTIN Lab Results  Component Value Date   CHOL 201 (H) 01/06/2022   TRIG 46 01/06/2022   HDL 46 01/06/2022   CHOLHDL 4.4 01/06/2022   VLDL 9 01/06/2022   LDLCALC 146 (H) 01/06/2022   LDLCALC 177 (H) 10/07/2021    Physical Findings: AIMS:  , ,  ,  ,    CIWA:    COWS:  Musculoskeletal: Strength & Muscle Tone: within normal limits Gait &  Station: normal Patient leans: N/A  Psychiatric Specialty Exam:  Presentation  General Appearance: Appropriate for Environment; Casual  Eye Contact:Good  Speech:Clear and Coherent; Normal Rate  Speech Volume:Normal  Handedness:Right   Mood and Affect  Mood:Euthymic  Affect:Appropriate; Congruent   Thought Process  Thought Processes:Coherent; Linear  Descriptions of Associations:Intact  Orientation:Full (Time, Place and Person)  Thought Content:Logical  History of Schizophrenia/Schizoaffective disorder:Yes  Duration of Psychotic Symptoms:Greater than six months  Hallucinations:No data recorded Ideas of Reference:None  Suicidal Thoughts:No data recorded Homicidal Thoughts:No data recorded  Sensorium  Memory:Immediate Good; Recent Good; Remote Good  Judgment:Good  Insight:Good   Executive Functions  Concentration:Good  Attention Span:Good  Recall:Good  Fund of Knowledge:Good  Language:Good   Psychomotor Activity  Psychomotor Activity:No data recorded  Assets  Assets:Financial Resources/Insurance; Desire for Improvement; Communication Skills; Leisure Time; Physical Health; Resilience   Sleep  Sleep:No data recorded   Physical Exam: Physical Exam Vitals and nursing note reviewed.  Constitutional:      Appearance: Normal appearance. [name] normal weight.  Neurological:     General: No focal deficit present.     Mental Status: [name] alert and oriented to person, place, and time.  Psychiatric:        Attention and Perception: Attention and perception normal.        Mood and Affect: Mood and affect normal.        Speech: Speech normal.        Behavior: Behavior is withdrawn.        Thought Content: Thought content is paranoid.        Cognition and Memory: Cognition and memory normal.        Judgment: Judgment normal.   Review of Systems  Constitutional: Negative.   HENT: Negative.    Eyes: Negative.   Respiratory: Negative.     Cardiovascular: Negative.   Gastrointestinal: Negative.   Genitourinary: Negative.   Musculoskeletal: Negative.   Skin: Negative.   Neurological: Negative.   Endo/Heme/Allergies: Negative.   Psychiatric/Behavioral: Negative.    Blood pressure 107/60, pulse 67, temperature 97.6 F (36.4 C), temperature source Oral, resp. rate 18, height 5\' 4"  (1.626 m), weight 53.5 kg, SpO2 100 %. Body mass index is 20.25 kg/m.   Treatment Plan Summary: Daily contact with patient to assess and evaluate symptoms and progress in treatment, Medication management, and Plan continue current medications.  Social work is working on living situation.  , DO 01/13/2022, 10:37 AM

## 2022-01-13 NOTE — Progress Notes (Signed)
Pt lying in bed with eyes open; calm, cooperative. Pt states "I'm feeling good." Pt denies pain and SI/HI/AVH at this time. Pt describes her sleep and appetite as "pretty good." She denies anxiety and depression at this time. Pt requested and was provided with snack, as she stated that she was hungry. No acute distress noted.

## 2022-01-13 NOTE — [facility] Counselor (Signed)
CSW spoke with pt regarding discharge options due to family being unable to take pt back home.  Pt is still adamant that apartment is hers despite family stating the contrary.  Pt states she does not have alternative housing options.  Pt stated she is not interested in living in an assisted living facility but maybe be open to a family care home.   She stated that she also needed a new bank card, CSW will work on assisting pt with getting address to have care delivered to hospital.   Shari [name], MSW, LCSW-A 2/9/20231:56 PM

## 2022-01-13 NOTE — Plan of Care (Signed)
Patient Calm and Cooperative during assessment. Pt Isolates to assigned room. Pt states "My sister is the reason for all this". Pt denies SI/HI/Avh and contracts for safety.  Pt denies Anxiety and Depression. Pt states "I don't think I am any different than before I came".  Pt denies pain. Pt did have adequate in  take. Writer encouraged pt to attend group therapy and participate in therapeutic milieu. Informed pt to notify nursing staff if any needs arise.  Problem: Education: Goal: Knowledge of [place] [facility] Education information/materials will improve Outcome: Progressing Goal: Emotional status will improve Outcome: Progressing Goal: Mental status will improve Outcome: Progressing   Problem: Education: Goal: Verbalization of understanding the information provided will improve Outcome: Not Progressing   Problem: Activity: Goal: Interest or engagement in activities will improve Outcome: Not Progressing  Q 15 minute safety checks in place.  Plan of care will continue.

## 2022-01-13 NOTE — Progress Notes (Signed)
Pt sitting on edge of bed; calm and cooperative. Pt states "I'm doing alright." Pt c/o LLE pain, which she contributes to sciatic nerve pain and rates 6 1/2 on 0-10 pain scale. Pt denies SI/HI/AVH at this time. Pt reports that she slept "good" last night and describes her appetite as "pretty good". She denies anxiety and depression at this time. No acute distress noted.

## 2022-01-14 NOTE — Plan of Care (Signed)
Patient presents A&O x 4. Patient affect is calm and pleasant and behavior withdrawn and isolative.  Denies AVH, SI, HI, depression or anxiety.  Patient denies pain at this time.  Reports sleeping well.  VSS.  Patient compliant with all scheduled meds.  Patient spent most of shift in her room except for meals.   Ongoing Q15 minute safety check rounds per unit protocol.   Problem: Education: Goal: Knowledge of [place] [facility] Education information/materials will improve Outcome: Progressing Goal: Emotional status will improve Outcome: Progressing Goal: Mental status will improve Outcome: Progressing Goal: Verbalization of understanding the information provided will improve Outcome: Progressing   Problem: Activity: Goal: Interest or engagement in activities will improve Outcome: Progressing Goal: Sleeping patterns will improve Outcome: Progressing   Problem: Coping: Goal: Ability to verbalize frustrations and anger appropriately will improve Outcome: Progressing Goal: Ability to demonstrate self-control will improve Outcome: Progressing   Problem: Health Behavior/Discharge Planning: Goal: Identification of resources available to assist in meeting health care needs will improve Outcome: Progressing Goal: Compliance with treatment plan for underlying cause of condition will improve Outcome: Progressing   Problem: Physical Regulation: Goal: Ability to maintain clinical measurements within normal limits will improve Outcome: Progressing   Problem: Safety: Goal: Periods of time without injury will increase Outcome: Progressing

## 2022-01-14 NOTE — Progress Notes (Signed)
Patient is A&Ox4. Pt denies pain, anxiety, and depression. Pt denies SI, HI, and AVH. Pt remains safe on this unit at this time.

## 2022-01-14 NOTE — Group Note (Signed)
LCSW Group Therapy Note  Group Date: 01/14/2022 Start Time: 1300 End Time: 1400   Type of Therapy and Topic:  Group Therapy - Healthy vs Unhealthy Coping Skills  Participation Level:  Active   Description of Group The focus of this group was to determine what unhealthy coping techniques typically are used by group members and what healthy coping techniques would be helpful in coping with various problems. Patients were guided in becoming aware of the differences between healthy and unhealthy coping techniques. Patients were asked to identify 2-3 healthy coping skills they would like to learn to use more effectively.  Therapeutic Goals Patients learned that coping is what human beings do all day long to deal with various situations in their lives Patients defined and discussed healthy vs unhealthy coping techniques Patients identified their preferred coping techniques and identified whether these were healthy or unhealthy Patients determined 2-3 healthy coping skills they would like to become more familiar with and use more often. Patients provided support and ideas to each other   Summary of Patient Progress:  Patient was present for the entirety of the group session. Patient was an active listener and participated in the topic of discussion, provided helpful advice to others, and added nuance to topic of conversation. Patient responded appropriately, though only when called upon.    Therapeutic Modalities Cognitive Behavioral Therapy Motivational Interviewing  Almedia Balls 01/14/2022  3:52 PM

## 2022-01-14 NOTE — Progress Notes (Signed)
[facility] MD Progress Note  01/14/2022 10:43 AM Shari [name]  MRN:  [id number] Subjective: Rekia continues to deny any signs or symptoms.  [name] very pleasant and cooperative with staff and myself.  She states that she slept well.  [name] taking her medications as prescribed and denies any side effects.  I informed her that her family said that she is not able to come back and live with them.  Social work is pursuing moving alternatives.  Principal Problem: Schizoaffective disorder, bipolar type (HCC) Diagnosis: Principal Problem:   Schizoaffective disorder, bipolar type (HCC) Active Problems:   Schizophrenia (HCC)  Total Time spent with patient: 15 minutes  Past Psychiatric History: yes  Past Medical History:  Past Medical History:  Diagnosis Date   Eye globe prosthesis    GERD (gastroesophageal reflux disease)    History of blood transfusion 1973   "related to abscess burst in my stomach"   Hyperlipemia    Hyperlipidemia    Hypertension    Osteoarthritis of back    Lowerback    SVD (spontaneous vaginal delivery)    x 1, baby died at 2 wks of age   Type II diabetes mellitus (HCC)     Past Surgical History:  Procedure Laterality Date   APPENDECTOMY     COLONOSCOPY     DILATATION & CURETTAGE/HYSTEROSCOPY WITH MYOSURE N/A 02/25/2015   Procedure: DILATATION & CURETTAGE/HYSTEROSCOPY WITH MYOSURE;  Surgeon: [name], MD;  Location: [facility];  Service: Gynecology;  Laterality: N/A;   DILATION AND CURETTAGE OF UTERUS     ENUCLEATION Right 03/16/2017   ENUCLEATION Right 03/16/2017   Procedure: ENUCLEATION RIGHT EYE;  Surgeon: [name], MD;  Location: [facility];  Service: Ophthalmology;  Laterality: Right;   EYE SURGERY     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6, 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   EYE SURGERY Right ~ 1974   "S/P initial eye injury; scissors stuck in my eye""   LAPAROSCOPIC CHOLECYSTECTOMY     SHOULDER ARTHROSCOPY WITH ROTATOR CUFF REPAIR Left    wire stitches per patient   SHOULDER  ARTHROSCOPY WITH ROTATOR CUFF REPAIR Right    Family History:  Family History  Problem Relation Age of Onset   Diabetes Mother    CAD Mother    Lung cancer Father     Social History:  Social History   Substance and Sexual Activity  Alcohol Use Yes   Comment: 03/16/2017 "nothing since 2013"     Social History   Substance and Sexual Activity  Drug Use No    Social History   Socioeconomic History   Marital status: Widowed    Spouse name: Not on file   Number of children: Not on file   Years of education: Not on file   Highest education level: Not on file  Occupational History   Not on file  Tobacco Use   Smoking status: Former    Packs/day: 0.50    Years: 40.00    Pack years: 20.00    Types: Cigarettes    Quit date: 2016    Years since quitting: 7.1   Smokeless tobacco: Never  Vaping Use   Vaping Use: Never used  Substance and Sexual Activity   Alcohol use: Yes    Comment: 03/16/2017 "nothing since 2013"   Drug use: No   Sexual activity: Not Currently    Birth control/protection: Post-menopausal  Other Topics Concern   Not on file  Social History Narrative   Not  on file   Social Determinants of Health   Financial Resource Strain: Not on file  Food Insecurity: Not on file  Transportation Needs: Not on file  Physical Activity: Not on file  Stress: Not on file  Social Connections: Not on file   Additional Social History:                         Sleep: Good  Appetite:  Fair  Current Medications: Current Facility-Administered Medications  Medication Dose Route Frequency Provider Last Rate Last Admin   acetaminophen (TYLENOL) tablet 650 mg  650 mg Oral Q6H PRN Clapacs, Jackquline Denmark, MD   650 mg at 01/11/22 2208   alum & mag hydroxide-simeth (MAALOX/MYLANTA) 200-200-20 MG/5ML suspension 30 mL  30 mL Oral Q4H PRN Clapacs, Jackquline Denmark, MD       atenolol (TENORMIN) tablet 12.5 mg  12.5 mg Oral Daily Clapacs, John T, MD   12.5 mg at 01/14/22 0900    atorvastatin (LIPITOR) tablet 40 mg  40 mg Oral Daily Clapacs, John T, MD   40 mg at 01/14/22 0900   divalproex (DEPAKOTE) DR tablet 500 mg  500 mg Oral QHS Clapacs, John T, MD   500 mg at 01/13/22 2141   hydrOXYzine (ATARAX) tablet 50 mg  50 mg Oral Q6H PRN Clapacs, John T, MD       ibuprofen (ADVIL) tablet 800 mg  800 mg Oral TID PRN Clapacs, John T, MD   800 mg at 01/13/22 1448   lisinopril (ZESTRIL) tablet 10 mg  10 mg Oral Daily Clapacs, John T, MD   10 mg at 01/14/22 0900   magnesium hydroxide (MILK OF MAGNESIA) suspension 30 mL  30 mL Oral Daily PRN Clapacs, Jackquline Denmark, MD       methimazole (TAPAZOLE) tablet 10 mg  10 mg Oral Daily Paliy, Serina Cowper, MD   10 mg at 01/14/22 0901   nicotine (NICODERM CQ - dosed in mg/24 hours) patch 21 mg  21 mg Transdermal Q0600 Clapacs, John T, MD       OLANZapine (ZYPREXA) tablet 10 mg  10 mg Oral QHS Clapacs, John T, MD   10 mg at 01/13/22 2141   pantoprazole (PROTONIX) EC tablet 40 mg  40 mg Oral BID AC Clapacs, John T, MD   40 mg at 01/14/22 0859   traZODone (DESYREL) tablet 50 mg  50 mg Oral QHS PRN Sarina Ill, DO        Lab Results: No results found for this or any previous visit (from the past 48 hour(s)).  Blood Alcohol level:  Lab Results  Component Value Date   ETH <10 01/04/2022   ETH <10 12/28/2021    Metabolic Disorder Labs: Lab Results  Component Value Date   HGBA1C 6.4 (H) 01/06/2022   MPG 137 01/06/2022   MPG 114.02 10/07/2021   No results found for: PROLACTIN Lab Results  Component Value Date   CHOL 201 (H) 01/06/2022   TRIG 46 01/06/2022   HDL 46 01/06/2022   CHOLHDL 4.4 01/06/2022   VLDL 9 01/06/2022   LDLCALC 146 (H) 01/06/2022   LDLCALC 177 (H) 10/07/2021    Physical Findings: AIMS:  , ,  ,  ,    CIWA:    COWS:     Musculoskeletal: Strength & Muscle Tone: within normal limits Gait & Station: normal Patient leans: N/A  Psychiatric Specialty Exam:  Presentation  General Appearance: Appropriate for  Environment; Casual  Eye Contact:Good  Speech:Clear and Coherent; Normal Rate  Speech Volume:Normal  Handedness:Right   Mood and Affect  Mood:Euthymic  Affect:Appropriate; Congruent   Thought Process  Thought Processes:Coherent; Linear  Descriptions of Associations:Intact  Orientation:Full (Time, Place and Person)  Thought Content:Logical  History of Schizophrenia/Schizoaffective disorder:Yes  Duration of Psychotic Symptoms:Greater than six months  Hallucinations:No data recorded Ideas of Reference:None  Suicidal Thoughts:No data recorded Homicidal Thoughts:No data recorded  Sensorium  Memory:Immediate Good; Recent Good; Remote Good  Judgment:Good  Insight:Good   Executive Functions  Concentration:Good  Attention Span:Good  Recall:Good  Fund of Knowledge:Good  Language:Good   Psychomotor Activity  Psychomotor Activity:No data recorded  Assets  Assets:Financial Resources/Insurance; Desire for Improvement; Communication Skills; Leisure Time; Physical Health; Resilience   Sleep  Sleep:No data recorded   Physical Exam: Physical Exam Vitals and nursing note reviewed.  Constitutional:      Appearance: Normal appearance. [name] normal weight.  Neurological:     General: No focal deficit present.     Mental Status: [name] alert and oriented to person, place, and time.  Psychiatric:        Attention and Perception: Attention and perception normal.        Mood and Affect: Mood and affect normal.        Speech: Speech normal.        Behavior: Behavior normal.        Thought Content: Thought content is paranoid.        Cognition and Memory: Cognition and memory normal.        Judgment: Judgment normal.   Review of Systems  Constitutional: Negative.   HENT: Negative.    Eyes: Negative.   Respiratory: Negative.    Cardiovascular: Negative.   Gastrointestinal: Negative.   Genitourinary: Negative.   Musculoskeletal: Negative.   Skin:  Negative.   Neurological: Negative.   Endo/Heme/Allergies: Negative.   Psychiatric/Behavioral:  Positive for depression. The patient is nervous/anxious.   Blood pressure (!) 115/50, pulse 80, temperature 97.7 F (36.5 C), temperature source Oral, resp. rate 18, height 5\' 4"  (1.626 m), weight 53.5 kg, SpO2 99 %. Body mass index is 20.25 kg/m.   Treatment Plan Summary: Daily contact with patient to assess and evaluate symptoms and progress in treatment, Medication management, and Plan continue current medications.  , DO 01/14/2022, 10:43 AM

## 2022-01-15 NOTE — Plan of Care (Signed)
Pt presents Flat and Guarded during assessment.  Pt denies SI/HI/Avh and contracts for safety.  Pt denies need for hospitalization.  Pt isolates to room majority of shift.  Pt denies Anxiety and Depression "There was nothing wrong with me from the beginning".  Pt not able to verbalize healthy coping skills learned since admission "My mind is not set on it right now".   Pt denies restful sleep "I just kept waking up during my sleep".  Problem: Education: Goal: Knowledge of [place] [facility] Education information/materials will improve Outcome: Progressing Goal: Emotional status will improve Outcome: Progressing Goal: Mental status will improve Outcome: Progressing Goal: Verbalization of understanding the information provided will improve Outcome: Progressing  Emotional support and encouragement given. Pt did comply with scheduled medications as ordered by provider and denies any adverse reactions from medications given. Pt did have adequate intake.  Informed pt to notify nursing staff in needs arise. Q 15 minute safety checks in place.  Plan of care will continue.

## 2022-01-15 NOTE — Progress Notes (Signed)
[facility] MD Progress Note  01/15/2022 1:59 PM Shari [name]  MRN:  [place]:[id number] Subjective: Follow-up for this 66 year old woman with a history of schizoaffective disorder.  Patient seen and chart reviewed.  Patient has no complaints.  Reports that she is not having hallucinations currently.  Says her mood is fine.  Does not have any physical complaints.  Staff does not report any concerns or problems.  No indication for any updated labs.  Patient does ask when she can be discharged and I told her that from what I can see in the chart there is not a definite plan for that yet but it is being addressed Principal Problem: Schizoaffective disorder, bipolar type ([place]) Diagnosis: Principal Problem:   Schizoaffective disorder, bipolar type ([place]) Active Problems:   Schizophrenia ([place])  Total Time spent with patient: 30 minutes  Past Psychiatric History: Past history of schizoaffective disorder with what sounds like some recent behavior problems.  Past Medical History:  Past Medical History:  Diagnosis Date   Eye globe prosthesis    GERD (gastroesophageal reflux disease)    History of blood transfusion 1973   "related to abscess burst in my stomach"   Hyperlipemia    Hyperlipidemia    Hypertension    Osteoarthritis of back    Lowerback    SVD (spontaneous vaginal delivery)    x 1, baby died at 2 wks of age   Type II diabetes mellitus ([place])     Past Surgical History:  Procedure Laterality Date   APPENDECTOMY     COLONOSCOPY     DILATATION & CURETTAGE/HYSTEROSCOPY WITH MYOSURE N/A 02/25/2015   Procedure: DILATATION & CURETTAGE/HYSTEROSCOPY WITH MYOSURE;  Surgeon: Marylynn Pearson, MD;  Location: [facility];  Service: Gynecology;  Laterality: N/A;   DILATION AND CURETTAGE OF UTERUS     ENUCLEATION Right 03/16/2017   ENUCLEATION Right 03/16/2017   Procedure: ENUCLEATION RIGHT EYE;  Surgeon: Clista Bernhardt, MD;  Location: [place];  Service: Ophthalmology;  Laterality: Right;   EYE SURGERY     right  eye @ at 6, 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   EYE SURGERY Right ~ 1974   "S/P initial eye injury; scissors stuck in my eye""   LAPAROSCOPIC CHOLECYSTECTOMY     SHOULDER ARTHROSCOPY WITH ROTATOR CUFF REPAIR Left    wire stitches per patient   SHOULDER ARTHROSCOPY WITH ROTATOR CUFF REPAIR Right    Family History:  Family History  Problem Relation Age of Onset   Diabetes Mother    CAD Mother    Lung cancer Father    Family Psychiatric  History: See previous Social History:  Social History   Substance and Sexual Activity  Alcohol Use Yes   Comment: 03/16/2017 "nothing since 2013"     Social History   Substance and Sexual Activity  Drug Use No    Social History   Socioeconomic History   Marital status: Widowed    Spouse name: Not on file   Number of children: Not on file   Years of education: Not on file   Highest education level: Not on file  Occupational History   Not on file  Tobacco Use   Smoking status: Former    Packs/day: 0.50    Years: 40.00    Pack years: 20.00    Types: Cigarettes    Quit date: 2016    Years since quitting: 7.1   Smokeless tobacco: Never  Vaping Use   Vaping Use: Never used  Substance and Sexual Activity  Alcohol use: Yes    Comment: 03/16/2017 "nothing since 2013"   Drug use: No   Sexual activity: Not Currently    Birth control/protection: Post-menopausal  Other Topics Concern   Not on file  Social History Narrative   Not on file   Social Determinants of Health   Financial Resource Strain: Not on file  Food Insecurity: Not on file  Transportation Needs: Not on file  Physical Activity: Not on file  Stress: Not on file  Social Connections: Not on file   Additional Social History:                         Sleep: Fair  Appetite:  Fair  Current Medications: Current Facility-Administered Medications  Medication Dose Route Frequency Provider Last Rate Last Admin   acetaminophen (TYLENOL) tablet 650 mg  650 mg Oral Q6H PRN  Marguerita Stapp T, MD   650 mg at 01/11/22 2208   alum & mag hydroxide-simeth (MAALOX/MYLANTA) 200-200-20 MG/5ML suspension 30 mL  30 mL Oral Q4H PRN Granvel Proudfoot T, MD       atenolol (TENORMIN) tablet 12.5 mg  12.5 mg Oral Daily Abbygayle Helfand T, MD   12.5 mg at 01/15/22 0915   atorvastatin (LIPITOR) tablet 40 mg  40 mg Oral Daily Rocket Gunderson T, MD   40 mg at 01/15/22 0914   divalproex (DEPAKOTE) DR tablet 500 mg  500 mg Oral QHS Trenee Igoe T, MD   500 mg at 01/14/22 2110   hydrOXYzine (ATARAX) tablet 50 mg  50 mg Oral Q6H PRN Stpehanie Montroy T, MD       ibuprofen (ADVIL) tablet 800 mg  800 mg Oral TID PRN Eulan Heyward T, MD   800 mg at 01/14/22 1413   lisinopril (ZESTRIL) tablet 10 mg  10 mg Oral Daily Domanique Luckett T, MD   10 mg at 01/15/22 0914   magnesium hydroxide (MILK OF MAGNESIA) suspension 30 mL  30 mL Oral Daily PRN Poetry Cerro, Madie Reno, MD       methimazole (TAPAZOLE) tablet 10 mg  10 mg Oral Daily Paliy, Delrae Rend, MD   10 mg at 01/15/22 0915   nicotine (NICODERM CQ - dosed in mg/24 hours) patch 21 mg  21 mg Transdermal Q0600 Marlys Stegmaier T, MD       OLANZapine (ZYPREXA) tablet 10 mg  10 mg Oral QHS Latonja Bobeck T, MD   10 mg at 01/14/22 2110   pantoprazole (PROTONIX) EC tablet 40 mg  40 mg Oral BID AC Lynnett Langlinais T, MD   40 mg at 01/15/22 0829   traZODone (DESYREL) tablet 50 mg  50 mg Oral QHS PRN Parks Ranger, DO        Lab Results: No results found for this or any previous visit (from the past 17 hour(s)).  Blood Alcohol level:  Lab Results  Component Value Date   ETH <10 01/04/2022   ETH <10 123456    Metabolic Disorder Labs: Lab Results  Component Value Date   HGBA1C 6.4 (H) 01/06/2022   MPG 137 01/06/2022   MPG 114.02 10/07/2021   No results found for: PROLACTIN Lab Results  Component Value Date   CHOL 201 (H) 01/06/2022   TRIG 46 01/06/2022   HDL 46 01/06/2022   CHOLHDL 4.4 01/06/2022   VLDL 9 01/06/2022   LDLCALC 146 (H) 01/06/2022   LDLCALC  177 (H) 10/07/2021    Physical Findings: AIMS:  , ,  ,  ,  CIWA:    COWS:     Musculoskeletal: Strength & Muscle Tone: within normal limits Gait & Station: normal Patient leans: N/A  Psychiatric Specialty Exam:  Presentation  General Appearance: Appropriate for Environment; Casual  Eye Contact:Good  Speech:Clear and Coherent; Normal Rate  Speech Volume:Normal  Handedness:Right   Mood and Affect  Mood:Euthymic  Affect:Appropriate; Congruent   Thought Process  Thought Processes:Coherent; Linear  Descriptions of Associations:Intact  Orientation:Full (Time, Place and Person)  Thought Content:Logical  History of Schizophrenia/Schizoaffective disorder:Yes  Duration of Psychotic Symptoms:Greater than six months  Hallucinations:No data recorded Ideas of Reference:None  Suicidal Thoughts:No data recorded Homicidal Thoughts:No data recorded  Sensorium  Memory:Immediate Good; Recent Good; Remote Good  Judgment:Good  Insight:Good   Executive Functions  Concentration:Good  Attention Span:Good  [place]  Language:Good   Psychomotor Activity  Psychomotor Activity:No data recorded  Assets  Assets:Financial Resources/Insurance; Desire for Improvement; Communication Skills; Leisure Time; Physical Health; Resilience   Sleep  Sleep:No data recorded   Physical Exam: Physical Exam Vitals and nursing note reviewed.  Constitutional:      Appearance: Normal appearance.  HENT:     Head: Normocephalic and atraumatic.     Mouth/Throat:     Pharynx: Oropharynx is clear.  Eyes:     Pupils: Pupils are equal, round, and reactive to light.  Cardiovascular:     Rate and Rhythm: Normal rate and regular rhythm.  Pulmonary:     Effort: Pulmonary effort is normal.     Breath sounds: Normal breath sounds.  Abdominal:     General: Abdomen is flat.     Palpations: Abdomen is soft.  Musculoskeletal:        General: Normal range  of motion.  Skin:    General: Skin is warm and dry.  Neurological:     General: No focal deficit present.     Mental Status: [name] alert. Mental status is at baseline.  Psychiatric:        Mood and Affect: Mood normal.        Thought Content: Thought content normal.   Review of Systems  Constitutional: Negative.   HENT: Negative.    Eyes: Negative.   Respiratory: Negative.    Cardiovascular: Negative.   Gastrointestinal: Negative.   Musculoskeletal: Negative.   Skin: Negative.   Neurological: Negative.   Psychiatric/Behavioral:  Negative for depression, hallucinations, substance abuse and suicidal ideas. The patient is not nervous/anxious.   Blood pressure (!) 128/59, pulse 71, temperature (!) 97.4 F (36.3 C), temperature source Oral, resp. rate 20, height 5\' 4"  (1.626 m), weight 53.5 kg, SpO2 100 %. Body mass index is 20.25 kg/m.   Treatment Plan Summary: Medication management and Plan no change to current psychiatric medication.  Encourage patient to be up and out of her bed when possible participating in groups and activities and to let staff know about any concerns.  Alethia Berthold, MD 01/15/2022, 1:59 PM

## 2022-01-15 NOTE — Progress Notes (Signed)
Patient is observed in the dayroom eating snack and interacting appropriately with others. Pt denies SI, HI, and AVH. Pt denies anxiety and depression. Pt c/o L. Leg pain due to sciatic nerve pain rated 7/10. Pt given Advil 800 mg as per MAR orders. Pt remains safe on the unit at this time.

## 2022-01-16 NOTE — Progress Notes (Signed)
[facility] MD Progress Note  01/16/2022 1:09 PM Shari [name]  MRN:  [id number] Subjective: Follow-up with this 66 year old woman with schizoaffective disorder.  Patient seen and chart reviewed.  Patient was neatly dressed and groomed sitting in her room.  Good eye contact.  Appropriate interaction.  Patient had no complaints or requests.  Appears to be lucid and calm. Principal Problem: Schizoaffective disorder, bipolar type ([place]) Diagnosis: Principal Problem:   Schizoaffective disorder, bipolar type ([place]) Active Problems:   Schizophrenia ([place])  Total Time spent with patient: 30 minutes  Past Psychiatric History: Past history of a diagnosis of schizoaffective disorder  Past Medical History:  Past Medical History:  Diagnosis Date   Eye globe prosthesis    GERD (gastroesophageal reflux disease)    History of blood transfusion 1973   "related to abscess burst in my stomach"   Hyperlipemia    Hyperlipidemia    Hypertension    Osteoarthritis of back    Lowerback    SVD (spontaneous vaginal delivery)    x 1, baby died at 2 wks of age   Type II diabetes mellitus ([place])     Past Surgical History:  Procedure Laterality Date   APPENDECTOMY     COLONOSCOPY     DILATATION & CURETTAGE/HYSTEROSCOPY WITH MYOSURE N/A 02/25/2015   Procedure: DILATATION & CURETTAGE/HYSTEROSCOPY WITH MYOSURE;  Surgeon: Marylynn Pearson, MD;  Location: [facility];  Service: Gynecology;  Laterality: N/A;   DILATION AND CURETTAGE OF UTERUS     ENUCLEATION Right 03/16/2017   ENUCLEATION Right 03/16/2017   Procedure: ENUCLEATION RIGHT EYE;  Surgeon: Clista Bernhardt, MD;  Location: [place];  Service: Ophthalmology;  Laterality: Right;   EYE SURGERY     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6, 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   EYE SURGERY Right ~ 1974   "S/P initial eye injury; scissors stuck in my eye""   LAPAROSCOPIC CHOLECYSTECTOMY     SHOULDER ARTHROSCOPY WITH ROTATOR CUFF REPAIR Left    wire stitches per patient   SHOULDER ARTHROSCOPY WITH ROTATOR CUFF  REPAIR Right    Family History:  Family History  Problem Relation Age of Onset   Diabetes Mother    CAD Mother    Lung cancer Father    Family Psychiatric  History: See previous Social History:  Social History   Substance and Sexual Activity  Alcohol Use Yes   Comment: 03/16/2017 "nothing since 2013"     Social History   Substance and Sexual Activity  Drug Use No    Social History   Socioeconomic History   Marital status: Widowed    Spouse name: Not on file   Number of children: Not on file   Years of education: Not on file   Highest education level: Not on file  Occupational History   Not on file  Tobacco Use   Smoking status: Former    Packs/day: 0.50    Years: 40.00    Pack years: 20.00    Types: Cigarettes    Quit date: 2016    Years since quitting: 7.1   Smokeless tobacco: Never  Vaping Use   Vaping Use: Never used  Substance and Sexual Activity   Alcohol use: Yes    Comment: 03/16/2017 "nothing since 2013"   Drug use: No   Sexual activity: Not Currently    Birth control/protection: Post-menopausal  Other Topics Concern   Not on file  Social History Narrative   Not on file   Social Determinants of Health  Financial Resource Strain: Not on file  Food Insecurity: Not on file  Transportation Needs: Not on file  Physical Activity: Not on file  Stress: Not on file  Social Connections: Not on file   Additional Social History:                         Sleep: Fair  Appetite:  Fair  Current Medications: Current Facility-Administered Medications  Medication Dose Route Frequency Provider Last Rate Last Admin   acetaminophen (TYLENOL) tablet 650 mg  650 mg Oral Q6H PRN Jacquise Rarick T, MD   650 mg at 01/11/22 2208   alum & mag hydroxide-simeth (MAALOX/MYLANTA) 200-200-20 MG/5ML suspension 30 mL  30 mL Oral Q4H PRN Jaquane Boughner T, MD       atenolol (TENORMIN) tablet 12.5 mg  12.5 mg Oral Daily Darrek Leasure T, MD   12.5 mg at 01/16/22 [id number]    atorvastatin (LIPITOR) tablet 40 mg  40 mg Oral Daily Wanona Stare, Madie Reno, MD   40 mg at 01/16/22 0925   divalproex (DEPAKOTE) DR tablet 500 mg  500 mg Oral QHS Ceclia Koker T, MD   500 mg at 01/15/22 2121   hydrOXYzine (ATARAX) tablet 50 mg  50 mg Oral Q6H PRN Rosaria Kubin T, MD       ibuprofen (ADVIL) tablet 800 mg  800 mg Oral TID PRN Brandan Robicheaux T, MD   800 mg at 01/15/22 2129   lisinopril (ZESTRIL) tablet 10 mg  10 mg Oral Daily Evonte Prestage T, MD   10 mg at 01/16/22 [id number]   magnesium hydroxide (MILK OF MAGNESIA) suspension 30 mL  30 mL Oral Daily PRN Olliver Boyadjian, Madie Reno, MD       methimazole (TAPAZOLE) tablet 10 mg  10 mg Oral Daily Paliy, Delrae Rend, MD   10 mg at 01/16/22 0925   nicotine (NICODERM CQ - dosed in mg/24 hours) patch 21 mg  21 mg Transdermal Q0600 Lonell Stamos T, MD       OLANZapine (ZYPREXA) tablet 10 mg  10 mg Oral QHS Jahnasia Tatum T, MD   10 mg at 01/15/22 2121   pantoprazole (PROTONIX) EC tablet 40 mg  40 mg Oral BID AC Mellina Benison T, MD   40 mg at 01/16/22 0733   traZODone (DESYREL) tablet 50 mg  50 mg Oral QHS PRN Parks Ranger, DO        Lab Results: No results found for this or any previous visit (from the past 10 hour(s)).  Blood Alcohol level:  Lab Results  Component Value Date   ETH <10 01/04/2022   ETH <10 123456    Metabolic Disorder Labs: Lab Results  Component Value Date   HGBA1C 6.4 (H) 01/06/2022   MPG 137 01/06/2022   MPG 114.02 10/07/2021   No results found for: PROLACTIN Lab Results  Component Value Date   CHOL 201 (H) 01/06/2022   TRIG 46 01/06/2022   HDL 46 01/06/2022   CHOLHDL 4.4 01/06/2022   VLDL 9 01/06/2022   LDLCALC 146 (H) 01/06/2022   LDLCALC 177 (H) 10/07/2021    Physical Findings: AIMS:  , ,  ,  ,    CIWA:    COWS:     Musculoskeletal: Strength & Muscle Tone: within normal limits Gait & Station: normal Patient leans: N/A  Psychiatric Specialty Exam:  Presentation  General Appearance: Appropriate for  Environment; Casual  Eye Contact:Good  Speech:Clear and Coherent; Normal Rate  Speech  Volume:Normal  Handedness:Right   Mood and Affect  Mood:Euthymic  Affect:Appropriate; Congruent   Thought Process  Thought Processes:Coherent; Linear  Descriptions of Associations:Intact  Orientation:Full (Time, Place and Person)  Thought Content:Logical  History of Schizophrenia/Schizoaffective disorder:Yes  Duration of Psychotic Symptoms:Greater than six months  Hallucinations:No data recorded Ideas of Reference:None  Suicidal Thoughts:No data recorded Homicidal Thoughts:No data recorded  Sensorium  Memory:Immediate Good; Recent Good; Remote Good  Judgment:Good  Insight:Good   Executive Functions  Concentration:Good  Attention Span:Good  [place]  Language:Good   Psychomotor Activity  Psychomotor Activity:No data recorded  Assets  Assets:Financial Resources/Insurance; Desire for Improvement; Communication Skills; Leisure Time; Physical Health; Resilience   Sleep  Sleep:No data recorded   Physical Exam: Physical Exam Vitals and nursing note reviewed.  Constitutional:      Appearance: Normal appearance.  HENT:     Head: Normocephalic and atraumatic.     Mouth/Throat:     Pharynx: Oropharynx is clear.  Eyes:     Pupils: Pupils are equal, round, and reactive to light.  Cardiovascular:     Rate and Rhythm: Normal rate and regular rhythm.  Pulmonary:     Effort: Pulmonary effort is normal.     Breath sounds: Normal breath sounds.  Abdominal:     General: Abdomen is flat.     Palpations: Abdomen is soft.  Musculoskeletal:        General: Normal range of motion.  Skin:    General: Skin is warm and dry.  Neurological:     General: No focal deficit present.     Mental Status: [name] alert. Mental status is at baseline.  Psychiatric:        Mood and Affect: Mood normal.        Thought Content: Thought content normal.    Review of Systems  Constitutional: Negative.   HENT: Negative.    Eyes: Negative.   Respiratory: Negative.    Cardiovascular: Negative.   Gastrointestinal: Negative.   Musculoskeletal: Negative.   Skin: Negative.   Neurological: Negative.   Psychiatric/Behavioral: Negative.    Blood pressure 124/64, pulse 72, temperature 97.7 F (36.5 C), temperature source Oral, resp. rate 18, height 5\' 4"  (1.626 m), weight 53.5 kg, SpO2 100 %. Body mass index is 20.25 kg/m.   Treatment Plan Summary: Medication management and Plan stable.  No need for any medication changes.  Supportive counseling and therapy and encouragement.  Alethia Berthold, MD 01/16/2022, 1:09 PM

## 2022-01-16 NOTE — BH IP Treatment Plan (Signed)
Interdisciplinary Treatment and Diagnostic Plan Update  01/16/2022 Time of Session: 0927 Shari [name] MRN: [id number]  Principal Diagnosis: Schizoaffective disorder, bipolar type [facility])  Secondary Diagnoses: Principal Problem:   Schizoaffective disorder, bipolar type (HCC) Active Problems:   Schizophrenia (HCC)   Current Medications:  Current Facility-Administered Medications  Medication Dose Route Frequency Provider Last Rate Last Admin   acetaminophen (TYLENOL) tablet 650 mg  650 mg Oral Q6H PRN Clapacs, Jackquline Denmark, MD   650 mg at 01/11/22 2208   alum & mag hydroxide-simeth (MAALOX/MYLANTA) 200-200-20 MG/5ML suspension 30 mL  30 mL Oral Q4H PRN Clapacs, Jackquline Denmark, MD       atenolol (TENORMIN) tablet 12.5 mg  12.5 mg Oral Daily Clapacs, John T, MD   12.5 mg at 01/15/22 0915   atorvastatin (LIPITOR) tablet 40 mg  40 mg Oral Daily Clapacs, Jackquline Denmark, MD   40 mg at 01/16/22 0925   divalproex (DEPAKOTE) DR tablet 500 mg  500 mg Oral QHS Clapacs, John T, MD   500 mg at 01/15/22 2121   hydrOXYzine (ATARAX) tablet 50 mg  50 mg Oral Q6H PRN Clapacs, Jackquline Denmark, MD       ibuprofen (ADVIL) tablet 800 mg  800 mg Oral TID PRN Clapacs, Jackquline Denmark, MD   800 mg at 01/15/22 2129   lisinopril (ZESTRIL) tablet 10 mg  10 mg Oral Daily Clapacs, Jackquline Denmark, MD   10 mg at 01/15/22 0914   magnesium hydroxide (MILK OF MAGNESIA) suspension 30 mL  30 mL Oral Daily PRN Clapacs, Jackquline Denmark, MD       methimazole (TAPAZOLE) tablet 10 mg  10 mg Oral Daily Thalia Party, MD   10 mg at 01/16/22 0925   nicotine (NICODERM CQ - dosed in mg/24 hours) patch 21 mg  21 mg Transdermal Q0600 Clapacs, John T, MD       OLANZapine (ZYPREXA) tablet 10 mg  10 mg Oral QHS Clapacs, John T, MD   10 mg at 01/15/22 2121   pantoprazole (PROTONIX) EC tablet 40 mg  40 mg Oral BID AC Clapacs, John T, MD   40 mg at 01/16/22 0867   traZODone (DESYREL) tablet 50 mg  50 mg Oral QHS PRN Sarina Ill, DO       PTA Medications: Medications Prior to Admission   Medication Sig Dispense Refill Last Dose   amantadine (SYMMETREL) 100 MG capsule Take 1 capsule (100 mg total) by mouth 2 (two) times daily. (Patient not taking: Reported on 12/28/2021) 60 capsule 0    atenolol (TENORMIN) 25 MG tablet Take 0.5 tablets (12.5 mg total) by mouth daily. 15 tablet 0    atorvastatin (LIPITOR) 40 MG tablet Take 1 tablet (40 mg total) by mouth daily. 30 tablet 0    divalproex (DEPAKOTE) 500 MG DR tablet Take 1 tablet (500 mg total) by mouth at bedtime. (Patient not taking: Reported on 01/04/2022) 30 tablet 2    ibuprofen (ADVIL) 200 MG tablet Take 800 mg by mouth See admin instructions. Take 800 mg by mouth two to three times a day as needed for pain      lisinopril (ZESTRIL) 10 MG tablet Take 1 tablet (10 mg total) by mouth daily. 30 tablet 0    methimazole (TAPAZOLE) 10 MG tablet Take 1 tablet (10 mg total) by mouth daily. 30 tablet 0    OLANZapine (ZYPREXA) 10 MG tablet Take 1 tablet (10 mg total) by mouth at bedtime. (Patient not taking: Reported on 01/04/2022) 30  tablet 2    pantoprazole (PROTONIX) 40 MG tablet Take 1 tablet (40 mg total) by mouth 2 (two) times daily before a meal. 60 tablet 0    traZODone (DESYREL) 50 MG tablet Take 1 tablet (50 mg total) by mouth at bedtime. (Patient not taking: Reported on 01/04/2022) 30 tablet 2     Patient Stressors: Marital or family conflict    Patient Strengths: Forensic psychologist fund of knowledge  Physical Health   Treatment Modalities: Medication Management, Group therapy, Case management,  1 to 1 session with clinician, Psychoeducation, Recreational therapy.   Physician Treatment Plan for Primary Diagnosis: Schizoaffective disorder, bipolar type (HCC) Long Term Goal(s): Improvement in symptoms so as ready for discharge   Short Term Goals: Ability to identify changes in lifestyle to reduce recurrence of condition will improve Ability to verbalize feelings will improve Ability to disclose and discuss  suicidal ideas Ability to demonstrate self-control will improve Ability to identify and develop effective coping behaviors will improve Ability to maintain clinical measurements within normal limits will improve Compliance with prescribed medications will improve Ability to identify triggers associated with substance abuse/mental health issues will improve  Medication Management: Evaluate patient's response, side effects, and tolerance of medication regimen.  Therapeutic Interventions: 1 to 1 sessions, Unit Group sessions and Medication administration.  Evaluation of Outcomes: Progressing  Physician Treatment Plan for Secondary Diagnosis: Principal Problem:   Schizoaffective disorder, bipolar type (HCC) Active Problems:   Schizophrenia (HCC)  Long Term Goal(s): Improvement in symptoms so as ready for discharge   Short Term Goals: Ability to identify changes in lifestyle to reduce recurrence of condition will improve Ability to verbalize feelings will improve Ability to disclose and discuss suicidal ideas Ability to demonstrate self-control will improve Ability to identify and develop effective coping behaviors will improve Ability to maintain clinical measurements within normal limits will improve Compliance with prescribed medications will improve Ability to identify triggers associated with substance abuse/mental health issues will improve     Medication Management: Evaluate patient's response, side effects, and tolerance of medication regimen.  Therapeutic Interventions: 1 to 1 sessions, Unit Group sessions and Medication administration.  Evaluation of Outcomes: Progressing   RN Treatment Plan for Primary Diagnosis: Schizoaffective disorder, bipolar type (HCC) Long Term Goal(s): Knowledge of disease and therapeutic regimen to maintain health will improve  Short Term Goals: Ability to remain free from injury will improve, Ability to verbalize frustration and anger  appropriately will improve, Ability to demonstrate self-control, Ability to participate in decision making will improve, Ability to verbalize feelings will improve, Ability to disclose and discuss suicidal ideas, Ability to identify and develop effective coping behaviors will improve, and Compliance with prescribed medications will improve  Medication Management: RN will administer medications as ordered by provider, will assess and evaluate patient's response and provide education to patient for prescribed medication. RN will report any adverse and/or side effects to prescribing provider.  Therapeutic Interventions: 1 on 1 counseling sessions, Psychoeducation, Medication administration, Evaluate responses to treatment, Monitor vital signs and CBGs as ordered, Perform/monitor CIWA, COWS, AIMS and Fall Risk screenings as ordered, Perform wound care treatments as ordered.  Evaluation of Outcomes: Progressing   LCSW Treatment Plan for Primary Diagnosis: Schizoaffective disorder, bipolar type (HCC) Long Term Goal(s): Safe transition to appropriate next level of care at discharge, Engage patient in therapeutic group addressing interpersonal concerns.  Short Term Goals: Engage patient in aftercare planning with referrals and resources, Increase social support, Increase ability to appropriately verbalize feelings,  Increase emotional regulation, Facilitate acceptance of mental health diagnosis and concerns, Facilitate patient progression through stages of change regarding substance use diagnoses and concerns, Identify triggers associated with mental health/substance abuse issues, and Increase skills for wellness and recovery  Therapeutic Interventions: Assess for all discharge needs, 1 to 1 time with Social worker, Explore available resources and support systems, Assess for adequacy in community support network, Educate family and significant other(s) on suicide prevention, Complete Psychosocial Assessment,  Interpersonal group therapy.  Evaluation of Outcomes: Progressing   Progress in Treatment: Attending groups: No. Participating in groups: No. Taking medication as prescribed: Yes. Toleration medication: Yes. Family/Significant other contact made: Yes, individual(s) contacted:  pt niece.  Patient understands diagnosis: No. Discussing patient identified problems/goals with staff: No. Medical problems stabilized or resolved: Yes. Denies suicidal/homicidal ideation: Yes. Issues/concerns per patient self-inventory: No. Other: N/A  New problem(s) identified: No, Describe:  none noted.  New Short Term/Long Term Goal(s): Patient to work towards dedication management for mood stabilization;  development of comprehensive mental wellness/sobriety plan. Update 01/11/22: No changes at this time. Update 01/16/22: No changes at this time.  Patient Goals:  "don't know what, if I find anything to work on, I'll do that" Update 01/11/22: No changes at this time. Update 01/16/22: No changes at this time.  Discharge Plan or Barriers: CSW will assist pt with development of an appropriate discharge/aftercare plan. Update 01/11/22: No changes at this time. Update 01/16/22: No changes at this time.  Reason for Continuation of Hospitalization: Medication stabilization  Estimated Length of Stay: TBD   Scribe for Treatment Team: Leisa Lenz, LCSW 01/16/2022 9:26 AM

## 2022-01-16 NOTE — Group Note (Signed)
LCSW Group Therapy Note  Group Date: 01/16/2022 Start Time: 1310 End Time: 1350   Type of Therapy and Topic:  Group Therapy - How To Cope with Nervousness about Discharge   Participation Level:  Did Not Attend   Description of Group This process group involved identification of patients' feelings about discharge. Some of them are scheduled to be discharged soon, while others are new admissions, but each of them was asked to share thoughts and feelings surrounding discharge from the hospital. One common theme was that they are excited at the prospect of going home, while another was that many of them are apprehensive about sharing why they were hospitalized. Patients were given the opportunity to discuss these feelings with their peers in preparation for discharge.  Therapeutic Goals  Patient will identify their overall feelings about pending discharge. Patient will think about how they might proactively address issues that they believe will once again arise once they get home (i.e. with parents). Patients will participate in discussion about having hope for change.   Summary of Patient Progress: Patient did not attend group despite encouraged participation.    Therapeutic Modalities Cognitive Behavioral Therapy   Marletta Lor 01/16/2022  3:40 PM

## 2022-01-16 NOTE — Plan of Care (Signed)
Patient presents A&O x 4. Patient affect is calm and pleasant and behavior withdrawn and isolative.  Denies AVH, SI, HI, depression or anxiety.  Patient denies pain at this time.  Reports sleeping well. Patient compliant with all scheduled meds. Patient spends most of shift in her room except for meals. Ongoing Q15 minute safety check rounds per unit protocol.   Problem: Education: Goal: Knowledge of [place] [facility] Education information/materials will improve Outcome: Progressing Goal: Emotional status will improve Outcome: Progressing Goal: Mental status will improve Outcome: Progressing Goal: Verbalization of understanding the information provided will improve Outcome: Progressing   Problem: Activity: Goal: Interest or engagement in activities will improve Outcome: Progressing Goal: Sleeping patterns will improve Outcome: Progressing   Problem: Coping: Goal: Ability to verbalize frustrations and anger appropriately will improve Outcome: Progressing Goal: Ability to demonstrate self-control will improve Outcome: Progressing   Problem: Health Behavior/Discharge Planning: Goal: Identification of resources available to assist in meeting health care needs will improve Outcome: Progressing Goal: Compliance with treatment plan for underlying cause of condition will improve Outcome: Progressing   Problem: Physical Regulation: Goal: Ability to maintain clinical measurements within normal limits will improve Outcome: Progressing   Problem: Safety: Goal: Periods of time without injury will increase Outcome: Progressing

## 2022-01-17 NOTE — Progress Notes (Signed)
Recreation Therapy Notes  Date: 01/17/2022   Time: 1:20 pm      Location: Courtyard        Behavioral response: N/A   Intervention Topic: Leisure     Discussion/Intervention: Patient refused to attend group.    Clinical Observations/Feedback:  Patient refused to attend group.    Erinn Mendosa LRT/CTRS        Dwayna Kentner 01/17/2022 2:44 PM

## 2022-01-17 NOTE — Plan of Care (Signed)
Patient presents A&O x 4. Patient affect is calm and pleasant and behavior withdrawn and isolative.  Denies AVH, SI, HI, depression or anxiety.  Patient denies pain at this time.  Reports sleeping well. Patient compliant with all scheduled meds. Patient spends most of shift in her room except for meals. Ongoing Q15 minute safety check rounds per unit protocol. ° ° °Problem: Education: °Goal: Knowledge of [place] [facility] Education information/materials will improve °Outcome: Progressing °Goal: Emotional status will improve °Outcome: Progressing °Goal: Mental status will improve °Outcome: Progressing °Goal: Verbalization of understanding the information provided will improve °Outcome: Progressing °  °Problem: Activity: °Goal: Interest or engagement in activities will improve °Outcome: Progressing °Goal: Sleeping patterns will improve °Outcome: Progressing °  °Problem: Coping: °Goal: Ability to verbalize frustrations and anger appropriately will improve °Outcome: Progressing °Goal: Ability to demonstrate self-control will improve °Outcome: Progressing °  °Problem: Health Behavior/Discharge Planning: °Goal: Identification of resources available to assist in meeting health care needs will improve °Outcome: Progressing °Goal: Compliance with treatment plan for underlying cause of condition will improve °Outcome: Progressing °  °Problem: Physical Regulation: °Goal: Ability to maintain clinical measurements within normal limits will improve °Outcome: Progressing °  °Problem: Safety: °Goal: Periods of time without injury will increase °Outcome: Progressing °  °

## 2022-01-17 NOTE — Group Note (Signed)
[facility] LCSW Group Therapy Note    Group Date: 01/17/2022 Start Time: [id number] End Time: 1500  Type of Therapy and Topic:  Group Therapy:  Overcoming Obstacles  Participation Level:  [facility] PARTICIPATION LEVEL: Active  Mood:  Description of Group:   In this group patients will be encouraged to explore what they see as obstacles to their own wellness and recovery. They will be guided to discuss their thoughts, feelings, and behaviors related to these obstacles. The group will process together ways to cope with barriers, with attention given to specific choices patients can make. Each patient will be challenged to identify changes they are motivated to make in order to overcome their obstacles. This group will be process-oriented, with patients participating in exploration of their own experiences as well as giving and receiving support and challenge from other group members.  Therapeutic Goals: 1. Patient will identify personal and current obstacles as they relate to admission. 2. Patient will identify barriers that currently interfere with their wellness or overcoming obstacles.  3. Patient will identify feelings, thought process and behaviors related to these barriers. 4. Patient will identify two changes they are willing to make to overcome these obstacles:    Summary of Patient Progress   Patient was present for the entirety of the session. Patient was an active listener and participated in the topic of discussion. She said she wanted to return home to her apartment and is adamant that her niece is their illegally. She stated that is her main obstacle. She also wants to get her bank card and says she will call her bank. She said that she does not have support because her family makes up lies about her.     Therapeutic Modalities:   Cognitive Behavioral Therapy Solution Focused Therapy Motivational Interviewing Relapse Prevention  Therapy   Charmine Bockrath A [place], LCSWA

## 2022-01-17 NOTE — Progress Notes (Signed)
[facility] MD Progress Note  01/17/2022 11:55 AM Shari [name]  MRN:  [id number] Subjective: Grenda had a chance to go to a shelter bed but they declined her due to her age and delusions.  She thinks that the FBI is still out to get her.  [name] sleeping well and she is taking her medication as prescribed.  She denies any side effects and there is no evidence of EPS or TD.  [name] unsafe for discharge because she is not able to live on the street.  She does have some income from disability and social work is working with her to find a place to go.  [name] pleasant and cooperative. Principal Problem: Schizoaffective disorder, bipolar type (HCC) Diagnosis: Principal Problem:   Schizoaffective disorder, bipolar type (HCC) Active Problems:   Schizophrenia (HCC)  Total Time spent with patient: 15 minutes  Past Psychiatric History: Yes  Past Medical History:  Past Medical History:  Diagnosis Date   Eye globe prosthesis    GERD (gastroesophageal reflux disease)    History of blood transfusion 1973   "related to abscess burst in my stomach"   Hyperlipemia    Hyperlipidemia    Hypertension    Osteoarthritis of back    Lowerback    SVD (spontaneous vaginal delivery)    x 1, baby died at 2 wks of age   Type II diabetes mellitus (HCC)     Past Surgical History:  Procedure Laterality Date   APPENDECTOMY     COLONOSCOPY     DILATATION & CURETTAGE/HYSTEROSCOPY WITH MYOSURE N/A 02/25/2015   Procedure: DILATATION & CURETTAGE/HYSTEROSCOPY WITH MYOSURE;  Surgeon: [name], MD;  Location: [facility];  Service: Gynecology;  Laterality: N/A;   DILATION AND CURETTAGE OF UTERUS     ENUCLEATION Right 03/16/2017   ENUCLEATION Right 03/16/2017   Procedure: ENUCLEATION RIGHT EYE;  Surgeon: [name], MD;  Location: [facility] OR;  Service: Ophthalmology;  Laterality: Right;   EYE SURGERY     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6, 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   EYE SURGERY Right ~ 1974   "S/P initial eye injury; scissors stuck in my  eye""   LAPAROSCOPIC CHOLECYSTECTOMY     SHOULDER ARTHROSCOPY WITH ROTATOR CUFF REPAIR Left    wire stitches per patient   SHOULDER ARTHROSCOPY WITH ROTATOR CUFF REPAIR Right    Family History:  Family History  Problem Relation Age of Onset   Diabetes Mother    CAD Mother    Lung cancer Father     Social History:  Social History   Substance and Sexual Activity  Alcohol Use Yes   Comment: 03/16/2017 "nothing since 2013"     Social History   Substance and Sexual Activity  Drug Use No    Social History   Socioeconomic History   Marital status: Widowed    Spouse name: Not on file   Number of children: Not on file   Years of education: Not on file   Highest education level: Not on file  Occupational History   Not on file  Tobacco Use   Smoking status: Former    Packs/day: 0.50    Years: 40.00    Pack years: 20.00    Types: Cigarettes    Quit date: 2016    Years since quitting: 7.1   Smokeless tobacco: Never  Vaping Use   Vaping Use: Never used  Substance and Sexual Activity   Alcohol use: Yes    Comment: 03/16/2017 "  nothing since 2013"   Drug use: No   Sexual activity: Not Currently    Birth control/protection: Post-menopausal  Other Topics Concern   Not on file  Social History Narrative   Not on file   Social Determinants of Health   Financial Resource Strain: Not on file  Food Insecurity: Not on file  Transportation Needs: Not on file  Physical Activity: Not on file  Stress: Not on file  Social Connections: Not on file   Additional Social History:                         Sleep: Good  Appetite:  Poor  Current Medications: Current Facility-Administered Medications  Medication Dose Route Frequency Provider Last Rate Last Admin   acetaminophen (TYLENOL) tablet 650 mg  650 mg Oral Q6H PRN Clapacs, John T, MD   650 mg at 01/11/22 2208   alum & mag hydroxide-simeth (MAALOX/MYLANTA) 200-200-20 MG/5ML suspension 30 mL  30 mL Oral Q4H PRN  Clapacs, John T, MD       atenolol (TENORMIN) tablet 12.5 mg  12.5 mg Oral Daily Clapacs, John T, MD   12.5 mg at 01/17/22 5300   atorvastatin (LIPITOR) tablet 40 mg  40 mg Oral Daily Clapacs, Jackquline Denmark, MD   40 mg at 01/17/22 0929   divalproex (DEPAKOTE) DR tablet 500 mg  500 mg Oral QHS Clapacs, John T, MD   500 mg at 01/16/22 2146   hydrOXYzine (ATARAX) tablet 50 mg  50 mg Oral Q6H PRN Clapacs, John T, MD       ibuprofen (ADVIL) tablet 800 mg  800 mg Oral TID PRN Clapacs, Jackquline Denmark, MD   800 mg at 01/17/22 0748   lisinopril (ZESTRIL) tablet 10 mg  10 mg Oral Daily Clapacs, John T, MD   10 mg at 01/17/22 5110   magnesium hydroxide (MILK OF MAGNESIA) suspension 30 mL  30 mL Oral Daily PRN Clapacs, Jackquline Denmark, MD       methimazole (TAPAZOLE) tablet 10 mg  10 mg Oral Daily Paliy, Serina Cowper, MD   10 mg at 01/17/22 2111   nicotine (NICODERM CQ - dosed in mg/24 hours) patch 21 mg  21 mg Transdermal Q0600 Clapacs, John T, MD       OLANZapine (ZYPREXA) tablet 10 mg  10 mg Oral QHS Clapacs, John T, MD   10 mg at 01/16/22 2146   pantoprazole (PROTONIX) EC tablet 40 mg  40 mg Oral BID AC Clapacs, John T, MD   40 mg at 01/17/22 0747   traZODone (DESYREL) tablet 50 mg  50 mg Oral QHS PRN Sarina Ill, DO        Lab Results: No results found for this or any previous visit (from the past 48 hour(s)).  Blood Alcohol level:  Lab Results  Component Value Date   ETH <10 01/04/2022   ETH <10 12/28/2021    Metabolic Disorder Labs: Lab Results  Component Value Date   HGBA1C 6.4 (H) 01/06/2022   MPG 137 01/06/2022   MPG 114.02 10/07/2021   No results found for: PROLACTIN Lab Results  Component Value Date   CHOL 201 (H) 01/06/2022   TRIG 46 01/06/2022   HDL 46 01/06/2022   CHOLHDL 4.4 01/06/2022   VLDL 9 01/06/2022   LDLCALC 146 (H) 01/06/2022   LDLCALC 177 (H) 10/07/2021    Physical Findings: AIMS:  , ,  ,  ,    CIWA:  COWS:     Musculoskeletal: Strength & Muscle Tone: within normal  limits Gait & Station: normal Patient leans: N/A  Psychiatric Specialty Exam:  Presentation  General Appearance: Appropriate for Environment; Casual  Eye Contact:Good  Speech:Clear and Coherent; Normal Rate  Speech Volume:Normal  Handedness:Right   Mood and Affect  Mood:Euthymic  Affect:Appropriate; Congruent   Thought Process  Thought Processes:Coherent; Linear  Descriptions of Associations:Intact  Orientation:Full (Time, Place and Person)  Thought Content:Logical  History of Schizophrenia/Schizoaffective disorder:Yes  Duration of Psychotic Symptoms:Greater than six months  Hallucinations:No data recorded Ideas of Reference:None  Suicidal Thoughts:No data recorded Homicidal Thoughts:No data recorded  Sensorium  Memory:Immediate Good; Recent Good; Remote Good  Judgment:Good  Insight:Good   Executive Functions  Concentration:Good  Attention Span:Good  Recall:Good  Fund of Knowledge:Good  Language:Good   Psychomotor Activity  Psychomotor Activity:No data recorded  Assets  Assets:Financial Resources/Insurance; Desire for Improvement; Communication Skills; Leisure Time; Physical Health; Resilience   Sleep  Sleep:No data recorded   Physical Exam: Physical Exam Vitals and nursing note reviewed.  Constitutional:      Appearance: Normal appearance. [name] normal weight.  Neurological:     General: No focal deficit present.     Mental Status: [name] alert and oriented to person, place, and time.  Psychiatric:        Attention and Perception: Attention and perception normal.        Mood and Affect: Affect normal. Mood is anxious.        Speech: Speech normal.        Behavior: Behavior normal. Behavior is cooperative.        Thought Content: Thought content is paranoid and delusional.        Cognition and Memory: Cognition and memory normal.        Judgment: Judgment normal.   Review of Systems  Constitutional: Negative.   HENT:  Negative.    Eyes: Negative.   Respiratory: Negative.    Cardiovascular: Negative.   Gastrointestinal: Negative.   Genitourinary: Negative.   Musculoskeletal: Negative.   Skin: Negative.   Neurological: Negative.   Endo/Heme/Allergies: Negative.   Psychiatric/Behavioral:  Positive for depression. The patient is nervous/anxious.   Blood pressure (!) 141/66, pulse 82, temperature 98.2 F (36.8 C), temperature source Oral, resp. rate 20, height 5\' 4"  (1.626 m), weight 53.5 kg, SpO2 100 %. Body mass index is 20.25 kg/m.   Treatment Plan Summary: Daily contact with patient to assess and evaluate symptoms and progress in treatment, Medication management, and Plan social work is continuing to look for some type of housing.  , DO 01/17/2022, 11:55 AM

## 2022-01-17 NOTE — [facility] Counselor (Signed)
CSW spoke with Ander Slade at [facility], [phone], regarding housing options for pt.   She stated she had 2 female beds open and was interested in referral. CSW plans to send updated FL2 and H&P to [phone].   CSW will follow up with pt regarding option.   Doyce Stonehouse [place], MSW, LCSW-A 2/13/20234:23 PM

## 2022-01-17 NOTE — Progress Notes (Signed)
Patient remains isolative to her room only coming out for meals. Pt is minimal and seems guarded. Pt denies SI, HI, and AVH. Pt denies anxiety and depression. Pt remains safe on the unit at this time.

## 2022-01-18 ENCOUNTER — Other Ambulatory Visit: Payer: Self-pay

## 2022-01-18 MED ORDER — TRAZODONE HCL 50 MG PO TABS
50.0000 mg | ORAL_TABLET | Freq: Every day | ORAL | 2 refills | Status: DC
  Filled 2022-01-18: qty 30, 30d supply, fill #0

## 2022-01-18 MED ORDER — ATORVASTATIN CALCIUM 40 MG PO TABS
40.0000 mg | ORAL_TABLET | Freq: Every day | ORAL | 0 refills | Status: DC
  Filled 2022-01-18: qty 30, 30d supply, fill #0

## 2022-01-18 MED ORDER — PANTOPRAZOLE SODIUM 40 MG PO TBEC
40.0000 mg | DELAYED_RELEASE_TABLET | Freq: Two times a day (BID) | ORAL | 0 refills | Status: DC
  Filled 2022-01-18: qty 60, 30d supply, fill #0

## 2022-01-18 MED ORDER — ATENOLOL 25 MG PO TABS
12.5000 mg | ORAL_TABLET | Freq: Every day | ORAL | 0 refills | Status: DC
  Filled 2022-01-18: qty 30, 60d supply, fill #0

## 2022-01-18 MED ORDER — DIVALPROEX SODIUM 500 MG PO DR TAB
500.0000 mg | DELAYED_RELEASE_TABLET | Freq: Every day | ORAL | 2 refills | Status: DC
  Filled 2022-01-18: qty 30, 30d supply, fill #0

## 2022-01-18 MED ORDER — LISINOPRIL 10 MG PO TABS
10.0000 mg | ORAL_TABLET | Freq: Every day | ORAL | 0 refills | Status: DC
  Filled 2022-01-18: qty 30, 30d supply, fill #0

## 2022-01-18 MED ORDER — METHIMAZOLE 10 MG PO TABS
10.0000 mg | ORAL_TABLET | Freq: Every day | ORAL | 0 refills | Status: DC
  Filled 2022-01-18: qty 30, 30d supply, fill #0

## 2022-01-18 MED ORDER — OLANZAPINE 10 MG PO TABS
10.0000 mg | ORAL_TABLET | Freq: Every day | ORAL | 2 refills | Status: DC
  Filled 2022-01-18: qty 30, 30d supply, fill #0

## 2022-01-18 NOTE — Progress Notes (Signed)
Patient denies SI, HI, and AVH. She reports right and left leg pain at a 5/10, Ibuprofen PRN was given. Patient is compliant with scheduled medications and does not report any side effects. Patient denies other concerns at this time. Patient remains mostly isolative to her room, but came out for breakfast. Patient remains safe on the unit at this time.

## 2022-01-18 NOTE — Progress Notes (Signed)
Recreation Therapy Notes  INPATIENT RECREATION TR PLAN  Patient Details Name: Shari [name] MRN: [id number] DOB: [birth year] Today's Date: 01/18/2022  Rec Therapy Plan Is patient appropriate for Therapeutic Recreation?: Yes Treatment times per week: at least 3 Estimated Length of Stay: 5-7 days TR Treatment/Interventions: Group participation (Comment)  Discharge Criteria Pt will be discharged from therapy if:: Discharged Treatment plan/goals/alternatives discussed and agreed upon by:: Patient/family  Discharge Summary Short term goals set: Patient will engage in groups without prompting or encouragement from LRT x3 group sessions within 5 recreation therapy group sessions Short term goals met: Not met Reason goals not met: Patient did not attend any grouops Therapeutic equipment acquired: N/A Reason patient discharged from therapy: Discharge from hospital Pt/family agrees with progress & goals achieved: Yes Date patient discharged from therapy: 01/18/22   [name] 01/18/2022, 12:19 PM

## 2022-01-18 NOTE — Progress Notes (Signed)
Recreation Therapy Notes  Date: 01/18/2022  Time: 1:20 pm    Location: Courtyard       Behavioral response: N/A   Intervention Topic: Social skills    Discussion/Intervention: Patient refused to attend group.   Clinical Observations/Feedback:  Patient refused to attend group.    Kyng Matlock LRT/CTRS         Sigifredo Pignato 01/18/2022 2:55 PM

## 2022-01-18 NOTE — Care Management Important Message (Signed)
Important Message  Patient Details  Name: SILAS SEDAM MRN: [id number] Date of Birth: 1956-09-22   Medicare Important Message Given:  Yes     Guerin Lashomb A [place], Theresia Majors 01/18/2022, 12:25 PM

## 2022-01-18 NOTE — Progress Notes (Signed)
°  [facility] Adult Case Management Discharge Plan :  Will you be returning to the same living situation after discharge:  No. At discharge, do you have transportation home?: Yes,  CSW will assist pt in scheduling transportation Do you have the ability to pay for your medications: Yes,  pt has [facility] Medicare  Release of information consent forms completed and in the chart;  Patient's signature needed at discharge.  Patient to Follow up at:  Follow-up Information     Services, [facility] Follow up.   Why: Though you declined follow up care, you can attend walk in hours Monday -Friday 8:00am-3pm to schedule your appointment with a provider, as needed for medication management and therapy.[address] [place], [place], [place] [address]  Address: Thanks! Contact information: [address] [place] [address] [phone]         [facility] Follow up.   Specialty: Urgent Care Why: Though you have declined follow up services, you can attend walk-in hours to schedule your appointment with a provider  as needed for therapy and medication management. Walk-in hours are from Monday through Wednesday 7:45am-11am. Thanks! Contact information: [place] [phone]                Next level of care provider has access to [place] and Suicide Prevention discussed: Yes,  completed with pt's niece     Has patient been referred to the Quitline?: Patient refused referral  Patient has been referred for addiction treatment: N/A  Brandalyn Harting A [place], [place] 01/18/2022, 1:43 PM

## 2022-01-18 NOTE — Progress Notes (Signed)
Pt was educated on prescriptions, follow up care, and medications. Pt questions were answered and pt verbalized understanding and did not voice any concerns. Pt's belongings and medications from the locker, safe, and pharmacy were returned and belongings sheets were signed. [facility].

## 2022-01-18 NOTE — Progress Notes (Signed)
Patient alert and oriented x 4, affect is flat but brightens upon approach no distress noted, interacting appropriately with peers and staff, denies SI/HI/AVH also complaint with medication regimen. 15 minutes safety checks maintained will continue to monitor.

## 2022-01-18 NOTE — BHH Counselor (Signed)
CSW spoke with pt regarding discharge. Pt stated that she was reluctant to discharge to [facility] but CSW assured pt that she has a reserved bed, would have resources in the shelter via case management, and that it is a day shelter and can assist with transportation.  Pt stated that she understood and agreed to discharge to facility.   Masiel Gentzler [place], MSW, LCSW-A 2/14/20234:09 PM
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
## 2022-01-18 NOTE — BHH Counselor (Signed)
CSW spoke with pt regarding discharge. CSW informed pt that a bed is reserved for her at [facility] in [place]. Pt is agreeable to go to shelter due to having no other housing options and not being able to go back home.   CSW asked if pt is interested in scheduling a follow up appointment. Pt declined follow up care.   Sharley Keeler [place], MSW, LCSW-A 2/14/202311:27 AM

## 2022-01-18 NOTE — Plan of Care (Signed)
Problem: Group Participation Goal: STG - Patient will engage in groups without prompting or encouragement from LRT x3 group sessions within 5 recreation therapy group sessions Description: STG - Patient will engage in groups without prompting or encouragement from LRT x3 group sessions within 5 recreation therapy group sessions 01/18/2022 1218 by Ernest Haber, LRT Outcome: Not Applicable 05/01/1291 9090 by Ernest Haber, LRT Outcome: Not Met (add Reason) Note: Patient did not attend any groups.

## 2022-01-18 NOTE — BHH Suicide Risk Assessment (Signed)
[facility] Discharge Suicide Risk Assessment   Principal Problem: Schizoaffective disorder, bipolar type [facility]) Discharge Diagnoses: Principal Problem:   Schizoaffective disorder, bipolar type (HCC) Active Problems:   Schizophrenia (HCC)   Total Time spent with patient: 1 hour  Musculoskeletal: Strength & Muscle Tone: within normal limits Gait & Station: normal Patient leans: N/A  Psychiatric Specialty Exam  Presentation  General Appearance: Appropriate for Environment; Casual  Eye Contact:Good  Speech:Clear and Coherent; Normal Rate  Speech Volume:Normal  Handedness:Right   Mood and Affect  Mood:Euthymic  Duration of Depression Symptoms: N/A  Affect:Appropriate; Congruent   Thought Process  Thought Processes:Coherent; Linear  Descriptions of Associations:Intact  Orientation:Full (Time, Place and Person)  Thought Content:Logical  History of Schizophrenia/Schizoaffective disorder:Yes  Duration of Psychotic Symptoms:Greater than six months  Hallucinations:No data recorded Ideas of Reference:None  Suicidal Thoughts:No data recorded Homicidal Thoughts:No data recorded  Sensorium  Memory:Immediate Good; Recent Good; Remote Good  Judgment:Good  Insight:Good   Executive Functions  Concentration:Good  Attention Span:Good  Recall:Good  Fund of Knowledge:Good  Language:Good   Psychomotor Activity  Psychomotor Activity:No data recorded  Assets  Assets:Financial Resources/Insurance; Desire for Improvement; Communication Skills; Leisure Time; Physical Health; Resilience   Sleep  Sleep:No data recorded  Physical Exam: Physical Exam Vitals and nursing note reviewed.  Constitutional:      Appearance: Normal appearance. [name] normal weight.  Neurological:     General: No focal deficit present.     Mental Status: [name] alert and oriented to person, place, and time.  Psychiatric:        Attention and Perception: Attention and perception normal.         Mood and Affect: Mood normal.        Speech: Speech normal.        Behavior: Behavior normal. Behavior is cooperative.        Thought Content: Thought content normal.        Cognition and Memory: Cognition and memory normal.        Judgment: Judgment normal.   Review of Systems  Constitutional: Negative.   HENT: Negative.    Eyes: Negative.   Respiratory: Negative.    Cardiovascular: Negative.   Gastrointestinal: Negative.   Genitourinary: Negative.   Musculoskeletal: Negative.   Skin: Negative.   Neurological: Negative.   Endo/Heme/Allergies: Negative.   Psychiatric/Behavioral: Negative.    Blood pressure (!) 111/58, pulse 70, temperature 97.7 F (36.5 C), temperature source Oral, resp. rate 18, height 5\' 4"  (1.626 m), weight 53.5 kg, SpO2 99 %. Body mass index is 20.25 kg/m.  Mental Status Per Nursing Assessment::   On Admission:  NA  Demographic Factors:  NA  Loss Factors: NA  Historical Factors: NA  Risk Reduction Factors:   NA  Continued Clinical Symptoms:  Previous Psychiatric Diagnoses and Treatments  Cognitive Features That Contribute To Risk:  None    Suicide Risk:  Minimal: No identifiable suicidal ideation.  Patients presenting with no risk factors but with morbid ruminations; may be classified as minimal risk based on the severity of the depressive symptoms    Plan Of Care/Follow-up recommendations: See Social Work Discharge   [phone], DO 01/18/2022, 11:25 AM

## 2022-01-18 NOTE — [place] FL2 (Signed)
[place] MEDICAID FL2 LEVEL OF CARE SCREENING TOOL     IDENTIFICATION  Patient Name: Shari [name] Birthdate: 04-22-1956 Sex: female Admission Date (Current Location): 01/05/2022  [facility] and [place] Number:  Chiropodist and Address:         Provider Number: [id number]  Attending Physician Name and Address:  Sarina Ill  Relative Name and Phone Number:       Current Level of Care: Hospital Recommended Level of Care: [facility] Prior Approval Number:    Date Approved/Denied:   PASRR Number:    Discharge Plan: Other (Comment) (Family care home)    Current Diagnoses: Patient Active Problem List   Diagnosis Date Noted   Schizoaffective disorder, bipolar type (HCC) 01/05/2022   Schizophrenia (HCC) 01/05/2022   Encounter for psychiatric assessment 11/21/2021   Paranoid schizophrenia (HCC) 10/20/2021   Mild cognitive impairment 10/18/2021   Gastroesophageal reflux disease 02/26/2021   Hypercholesterolemia 02/26/2021   Hypertension 02/26/2021   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   Graves disease 09/20/2019   Palpitations 05/29/2019   Hyperthyroidism 05/29/2019   Mixed dyslipidemia 05/29/2019   Paroxysmal atrial fibrillation (HCC) 05/29/2019   PMB (postmenopausal bleeding) 03/21/2018   Urinary retention 03/21/2018   Uterine enlargement 03/21/2018   Surgery, elective 03/16/2017    Orientation RESPIRATION BLADDER Height & Weight        Normal Continent Weight: 117 lb 15.1 oz (53.5 kg) Height:  5\' 4"  (162.6 cm)  BEHAVIORAL SYMPTOMS/MOOD NEUROLOGICAL BOWEL NUTRITION STATUS  Other (Comment) (Pt is paranoid and has threatened her family members)  (N/A) Continent  (N/A)  AMBULATORY STATUS COMMUNICATION OF NEEDS Skin   Independent Verbally Normal                       Personal Care Assistance Level of Assistance   (N/A)           Functional Limitations Info  Sight Sight Info:  Impaired (Pt has a prosthetic eye)        SPECIAL CARE FACTORS FREQUENCY  Blood pressure (Blood pressure medication and hyperthyroid medication) Blood Pressure Frequency: normal unit scheduled, 3x per day                  Contractures Contractures Info: Not present    Additional Factors Info  Code Status Code Status Info: Full             Current Medications (01/18/2022):  This is the current hospital active medication list Current Facility-Administered Medications  Medication Dose Route Frequency Provider Last Rate Last Admin   acetaminophen (TYLENOL) tablet 650 mg  650 mg Oral Q6H PRN Clapacs, John T, MD   650 mg at 01/11/22 2208   alum & mag hydroxide-simeth (MAALOX/MYLANTA) 200-200-20 MG/5ML suspension 30 mL  30 mL Oral Q4H PRN Clapacs, John T, MD       atenolol (TENORMIN) tablet 12.5 mg  12.5 mg Oral Daily Clapacs, John T, MD   12.5 mg at 01/18/22 0908   atorvastatin (LIPITOR) tablet 40 mg  40 mg Oral Daily Clapacs, 01/20/22, MD   40 mg at 01/18/22 0909   divalproex (DEPAKOTE) DR tablet 500 mg  500 mg Oral QHS Clapacs, John T, MD   500 mg at 01/17/22 2143   hydrOXYzine (ATARAX) tablet 50 mg  50 mg Oral Q6H PRN Clapacs, John T, MD       ibuprofen (ADVIL) tablet 800 mg  800  mg Oral TID PRN Clapacs, Jackquline Denmark, MD   800 mg at 01/18/22 4975   lisinopril (ZESTRIL) tablet 10 mg  10 mg Oral Daily Clapacs, Jackquline Denmark, MD   10 mg at 01/18/22 3005   magnesium hydroxide (MILK OF MAGNESIA) suspension 30 mL  30 mL Oral Daily PRN Clapacs, Jackquline Denmark, MD       methimazole (TAPAZOLE) tablet 10 mg  10 mg Oral Daily Paliy, Serina Cowper, MD   10 mg at 01/18/22 0908   nicotine (NICODERM CQ - dosed in mg/24 hours) patch 21 mg  21 mg Transdermal Q0600 Clapacs, John T, MD       OLANZapine (ZYPREXA) tablet 10 mg  10 mg Oral QHS Clapacs, John T, MD   10 mg at 01/17/22 2143   pantoprazole (PROTONIX) EC tablet 40 mg  40 mg Oral BID AC Clapacs, John T, MD   40 mg at 01/18/22 1102   traZODone (DESYREL) tablet 50 mg  50  mg Oral QHS PRN Sarina Ill, DO   50 mg at 01/17/22 2143     Discharge Medications: Please see discharge summary for a list of discharge medications.  Relevant Imaging Results:  Relevant Lab Results:   Additional Information Pt denies diagnosis of schizoaffective disorder  Naysa Puskas A [place], LCSWA

## 2022-01-19 DIAGNOSIS — S86912A Strain of unspecified muscle(s) and tendon(s) at lower leg level, left leg, initial encounter: Secondary | ICD-10-CM | POA: Diagnosis not present

## 2022-01-19 DIAGNOSIS — R55 Syncope and collapse: Secondary | ICD-10-CM | POA: Diagnosis not present

## 2022-01-19 DIAGNOSIS — R296 Repeated falls: Secondary | ICD-10-CM | POA: Diagnosis not present

## 2022-01-19 DIAGNOSIS — M791 Myalgia, unspecified site: Secondary | ICD-10-CM | POA: Diagnosis not present

## 2022-01-19 DIAGNOSIS — K219 Gastro-esophageal reflux disease without esophagitis: Secondary | ICD-10-CM | POA: Diagnosis not present

## 2022-01-19 DIAGNOSIS — Z886 Allergy status to analgesic agent status: Secondary | ICD-10-CM | POA: Diagnosis not present

## 2022-01-19 DIAGNOSIS — Z91018 Allergy to other foods: Secondary | ICD-10-CM | POA: Diagnosis not present

## 2022-01-19 DIAGNOSIS — I10 Essential (primary) hypertension: Secondary | ICD-10-CM | POA: Diagnosis not present

## 2022-01-19 DIAGNOSIS — Z87891 Personal history of nicotine dependence: Secondary | ICD-10-CM | POA: Diagnosis not present

## 2022-01-19 DIAGNOSIS — E119 Type 2 diabetes mellitus without complications: Secondary | ICD-10-CM | POA: Diagnosis not present

## 2022-01-19 DIAGNOSIS — Z880 Allergy status to penicillin: Secondary | ICD-10-CM | POA: Diagnosis not present

## 2022-01-19 DIAGNOSIS — Z882 Allergy status to sulfonamides status: Secondary | ICD-10-CM | POA: Diagnosis not present

## 2022-01-19 DIAGNOSIS — Z79899 Other long term (current) drug therapy: Secondary | ICD-10-CM | POA: Diagnosis not present

## 2022-01-19 DIAGNOSIS — E785 Hyperlipidemia, unspecified: Secondary | ICD-10-CM | POA: Diagnosis not present

## 2022-01-19 DIAGNOSIS — R42 Dizziness and giddiness: Secondary | ICD-10-CM | POA: Diagnosis not present

## 2022-01-19 DIAGNOSIS — S76912A Strain of unspecified muscles, fascia and tendons at thigh level, left thigh, initial encounter: Secondary | ICD-10-CM | POA: Diagnosis not present

## 2022-01-19 DIAGNOSIS — S79922A Unspecified injury of left thigh, initial encounter: Secondary | ICD-10-CM | POA: Diagnosis not present

## 2022-01-20 ENCOUNTER — Emergency Department (HOSPITAL_COMMUNITY)
Admission: EM | Admit: 2022-01-20 | Discharge: 2022-01-22 | Disposition: A | Discharge status: Home / Self Care | Payer: Medicare Other | Attending: Emergency Medicine | Admitting: Emergency Medicine

## 2022-01-20 ENCOUNTER — Encounter (HOSPITAL_COMMUNITY): Payer: Self-pay | Admitting: Emergency Medicine

## 2022-01-20 ENCOUNTER — Other Ambulatory Visit: Payer: Self-pay

## 2022-01-20 DIAGNOSIS — F200 Paranoid schizophrenia: Secondary | ICD-10-CM | POA: Diagnosis present

## 2022-01-20 DIAGNOSIS — F22 Delusional disorders: Secondary | ICD-10-CM | POA: Diagnosis not present

## 2022-01-20 DIAGNOSIS — W19XXXA Unspecified fall, initial encounter: Secondary | ICD-10-CM | POA: Diagnosis not present

## 2022-01-20 DIAGNOSIS — R55 Syncope and collapse: Secondary | ICD-10-CM | POA: Diagnosis not present

## 2022-01-20 DIAGNOSIS — Z20822 Contact with and (suspected) exposure to covid-19: Secondary | ICD-10-CM | POA: Insufficient documentation

## 2022-01-20 DIAGNOSIS — F319 Bipolar disorder, unspecified: Secondary | ICD-10-CM | POA: Insufficient documentation

## 2022-01-20 DIAGNOSIS — Z7689 Persons encountering health services in other specified circumstances: Secondary | ICD-10-CM

## 2022-01-20 DIAGNOSIS — Z79899 Other long term (current) drug therapy: Secondary | ICD-10-CM | POA: Diagnosis not present

## 2022-01-20 DIAGNOSIS — R9431 Abnormal electrocardiogram [ECG] [EKG]: Secondary | ICD-10-CM | POA: Diagnosis not present

## 2022-01-20 DIAGNOSIS — I10 Essential (primary) hypertension: Secondary | ICD-10-CM | POA: Insufficient documentation

## 2022-01-20 DIAGNOSIS — Z886 Allergy status to analgesic agent status: Secondary | ICD-10-CM | POA: Diagnosis not present

## 2022-01-20 DIAGNOSIS — F17210 Nicotine dependence, cigarettes, uncomplicated: Secondary | ICD-10-CM | POA: Diagnosis not present

## 2022-01-20 DIAGNOSIS — K219 Gastro-esophageal reflux disease without esophagitis: Secondary | ICD-10-CM | POA: Diagnosis not present

## 2022-01-20 DIAGNOSIS — E119 Type 2 diabetes mellitus without complications: Secondary | ICD-10-CM | POA: Diagnosis not present

## 2022-01-20 DIAGNOSIS — Z880 Allergy status to penicillin: Secondary | ICD-10-CM | POA: Diagnosis not present

## 2022-01-20 DIAGNOSIS — S7002XA Contusion of left hip, initial encounter: Secondary | ICD-10-CM | POA: Diagnosis not present

## 2022-01-20 DIAGNOSIS — Z5900 Homelessness unspecified: Secondary | ICD-10-CM | POA: Insufficient documentation

## 2022-01-20 DIAGNOSIS — R296 Repeated falls: Secondary | ICD-10-CM | POA: Diagnosis not present

## 2022-01-20 DIAGNOSIS — Z882 Allergy status to sulfonamides status: Secondary | ICD-10-CM | POA: Diagnosis not present

## 2022-01-20 DIAGNOSIS — S7012XA Contusion of left thigh, initial encounter: Secondary | ICD-10-CM | POA: Diagnosis not present

## 2022-01-20 DIAGNOSIS — Z91018 Allergy to other foods: Secondary | ICD-10-CM | POA: Diagnosis not present

## 2022-01-20 DIAGNOSIS — R440 Auditory hallucinations: Secondary | ICD-10-CM | POA: Diagnosis present

## 2022-01-20 DIAGNOSIS — E785 Hyperlipidemia, unspecified: Secondary | ICD-10-CM | POA: Diagnosis not present
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
## 2022-01-21 LAB — RESP PANEL BY RT-PCR (FLU A&B, COVID) ARPGX2
Influenza A by PCR: NEGATIVE
Influenza B by PCR: NEGATIVE
SARS Coronavirus 2 by RT PCR: NEGATIVE

## 2022-01-21 MED ORDER — ATORVASTATIN CALCIUM 40 MG PO TABS
40.0000 mg | ORAL_TABLET | Freq: Every day | ORAL | Status: DC
  Administered 2022-01-21: 40 mg via ORAL
  Filled 2022-01-21: qty 1

## 2022-01-21 MED ORDER — ALUM & MAG HYDROXIDE-SIMETH 200-200-20 MG/5ML PO SUSP: 30.0000 mL | Freq: Four times a day (QID) | ORAL | Status: DC | PRN

## 2022-01-21 MED ORDER — ONDANSETRON HCL 4 MG PO TABS: 4.0000 mg | ORAL_TABLET | Freq: Three times a day (TID) | ORAL | Status: DC | PRN

## 2022-01-21 MED ORDER — METHIMAZOLE 10 MG PO TABS
10.0000 mg | ORAL_TABLET | Freq: Every day | ORAL | Status: DC
  Administered 2022-01-21: 10 mg via ORAL
  Filled 2022-01-21 (×2): qty 1

## 2022-01-21 MED ORDER — ATENOLOL 25 MG PO TABS
12.5000 mg | ORAL_TABLET | Freq: Every day | ORAL | Status: DC
  Administered 2022-01-21: 12.5 mg via ORAL
  Filled 2022-01-21: qty 1

## 2022-01-21 MED ORDER — LISINOPRIL 10 MG PO TABS
10.0000 mg | ORAL_TABLET | Freq: Every evening | ORAL | Status: DC
  Administered 2022-01-21: 10 mg via ORAL
  Filled 2022-01-21: qty 1

## 2022-01-21 MED ORDER — PANTOPRAZOLE SODIUM 40 MG PO TBEC
40.0000 mg | DELAYED_RELEASE_TABLET | Freq: Two times a day (BID) | ORAL | Status: DC
  Administered 2022-01-21 – 2022-01-22 (×3): 40 mg via ORAL
  Filled 2022-01-21 (×3): qty 1

## 2022-01-21 MED ORDER — OLANZAPINE 10 MG PO TABS
10.0000 mg | ORAL_TABLET | Freq: Every day | ORAL | Status: DC
  Administered 2022-01-21: 10 mg via ORAL
  Filled 2022-01-21 (×2): qty 1

## 2022-01-21 MED ORDER — ACETAMINOPHEN 325 MG PO TABS
650.0000 mg | ORAL_TABLET | ORAL | Status: DC | PRN
  Administered 2022-01-22: 650 mg via ORAL
  Filled 2022-01-21: qty 2

## 2022-01-21 MED ORDER — TRAZODONE HCL 50 MG PO TABS: 50.0000 mg | ORAL_TABLET | Freq: Every evening | ORAL | Status: DC | PRN

## 2022-01-21 MED ORDER — DIVALPROEX SODIUM 250 MG PO DR TAB
500.0000 mg | DELAYED_RELEASE_TABLET | Freq: Every day | ORAL | Status: DC
  Administered 2022-01-21: 500 mg via ORAL
  Filled 2022-01-21: qty 2

## 2022-01-21 NOTE — ED Provider Notes (Signed)
[facility] EMERGENCY DEPARTMENT Provider Note   CSN: [id number] Arrival date & time: 01/20/22  2109     History  Chief Complaint  Patient presents with   Stress   Leg Pain    Shari [name] is a 66 y.o. female.  The history is provided by the patient.  Leg Pain She has a history of hypertension, diabetes, hyperlipidemia, paranoid schizophrenia and states that she has a lot of issues regarding her home and family life.  [name] currently homeless.  She had fallen 2 days ago and injured her left upper leg.  She had been seen at [facility] and had x-rays done which were negative.  She states her leg is still hurting her.  She currently denies homicidal and suicidal ideation.  She does endorse auditory hallucinations but denies any command hallucinations.  [name] somewhat evasive about whether she is having any paranoid ideation.  She denies ethanol and drug use.   Home Medications Prior to Admission medications   Medication Sig Start Date End Date Taking? Authorizing Provider  atenolol (TENORMIN) 25 MG tablet Take 0.5 tablets (12.5 mg total) by mouth daily. 01/19/22   Sarina Ill, DO  atorvastatin (LIPITOR) 40 MG tablet Take 1 tablet (40 mg total) by mouth daily. 01/18/22 02/17/22  Sarina Ill, DO  divalproex (DEPAKOTE) 500 MG DR tablet Take 1 tablet (500 mg total) by mouth at bedtime. 01/18/22 04/18/22  Sarina Ill, DO  lisinopril (ZESTRIL) 10 MG tablet Take 1 tablet (10 mg total) by mouth daily. 01/18/22 02/17/22  Sarina Ill, DO  methimazole (TAPAZOLE) 10 MG tablet Take 1 tablet (10 mg total) by mouth daily. 01/18/22 02/17/22  Sarina Ill, DO  OLANZapine (ZYPREXA) 10 MG tablet Take 1 tablet (10 mg total) by mouth at bedtime. 01/18/22 04/18/22  Sarina Ill, DO  pantoprazole (PROTONIX) 40 MG tablet Take 1 tablet (40 mg total) by mouth 2 (two) times daily before a meal. 01/18/22 02/17/22  Sarina Ill, DO  traZODone (DESYREL) 50 MG tablet Take 1 tablet (50 mg total) by mouth at bedtime. 01/18/22 04/18/22  Sarina Ill, DO      Allergies    Aspirin, Chocolate, Cocoa, Penicillins, and Sulfa antibiotics    Review of Systems   Review of Systems  All other systems reviewed and are negative.  Physical Exam Updated Vital Signs BP (!) 116/55    Pulse 67    Temp 98.3 F (36.8 C) (Oral)    Resp 16    Wt 61.7 kg    SpO2 97%    BMI 23.34 kg/m  Physical Exam Vitals and nursing note reviewed.  66 year old female, resting comfortably and in no acute distress. Vital signs are normal. Oxygen saturation is 97%, which is normal. Head is normocephalic and atraumatic.  Left pupil response to light and has full range of ocular movements.  Right eye is prosthetic. Oropharynx is clear. Neck is nontender and supple without adenopathy or JVD. Back is nontender and there is no CVA tenderness. Lungs are clear without rales, wheezes, or rhonchi. Chest is nontender. Heart has regular rate and rhythm without murmur. Abdomen is soft, flat, nontender. Extremities have no cyanosis or edema, full range of motion is present. Skin is warm and dry without rash. Neurologic: Mental status is normal, cranial nerves are intact, moves all extremities equally. Psychiatric: Normal affect, engages normally in conversation.  Does not seem to be responding to internal  stimuli.  ED Results / Procedures / Treatments    Procedures Procedures    Medications Ordered in ED Medications - No data to display  ED Course/ Medical Decision Making/ A&P                           Medical Decision Making  Paranoid schizophrenia which may be somewhat decompensated.  Old records reviewed confirming recent admission for psychiatric illness, had 2 ED visits for follow-up with left leg injury and negative x-rays.  Patient apparently does not have any outpatient clinic that she is following up with.  We will  request TTS consultation, but I suspect that she can safely be referred to an outpatient clinic such as [facility].        Final Clinical Impression(s) / ED Diagnoses Final diagnoses:  Paranoid ideation [facility])    Rx / DC Orders ED Discharge Orders     None         Dione Booze, MD 01/21/22 [phone]
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
## 2022-01-22 NOTE — Progress Notes (Signed)
CSW provided the following resources for the patient to utilize upon discharge:   Substance Abuse Resources   [facility] - Admissions are currently completed Monday through Friday at 8am; both appointments and walk-ins are accepted.  Any individual that is a [facility] resident may present for a substance abuse screening and assessment for admission.  A person may be referred by numerous sources or self-refer.   Potential clients will be screened for medical necessity and appropriateness for the program.  Clients must meet criteria for high-intensity residential treatment services.  If clinically appropriate, a client will continue with the comprehensive clinical assessment and intake process, as well as enrollment in the [facility].  Address: [address] [place], [place] [address] Admin Hours: Mon-Fri 8AM to [facility] Hours: 24/7 Phone: [phone] Fax: [phone]  [facility] (Det[facility]) Facility Based Crisis:  These are 3 locations for services: Please call before arrival:    [facility] Recovery Facility Based Crisis [facility])  Address: [address] [place], [place] [address] Phone: [phone]  [facility] Facility Based Crisis [facility]) Address: [address] [place], [place] [address] Phone#: [phone]  [facility] Recovery Facility Based Crisis [facility]) Address: [address] [place], [place] [address] Phone#: [phone]   [facility] ([facility]): (offers outpatient therapy and intensive outpatient substance abuse therapy).  [address] [place], [place] [address] Phone: [phone]  [facility] Address: [address] [place], [place] [address] Phone: [phone]  -The [facility] provides food, shelter and other programs and services to the homeless men of [place] through our [employer] program.  By offering safe shelter, three meals a day, clean  clothing, Biblical counseling, financial planning, vocational training, GED/education and employment assistance, we've helped mend the shattered lives of many homeless men since opening in [place].  We have approximately 267 beds available, with a max of 312 beds including mats for emergency situations and currently house an average of 270 men a night.  Prospective Client Check-In Information Photo ID Required (State/ Out of State/ [facility]) - if photo ID is not available, clients are required to have a printout of a police/sheriff's criminal history report. Help out with chores around the [place]. No sex offender of any type (pending, charged, registered and/or any other sex related offenses) will be permitted to check in. Must be willing to abide by all rules, regulations, and policies established by the [employer]. The following will be provided - shelter, food, clothing, and biblical counseling. If you or someone you know is in need of assistance at our [facility] shelter in [place], [place], please call [phone] ext. [phone].  Women Shelter for [employer] hours are Monday-Friday only.   [facility]:   Phone: [phone]  The [facility] SA Intensive Outpatient Program [facility]) means structured individual and group addiction activities and services that are provided at an outpatient program designed to assist adult and adolescent consumers to begin recovery and learn skills for recovery maintenance. The [employer] SAIOP program is offered at least 3 hours a day, 3 days a week. SAIOP services shall include a structured program consisting of, but not limited to, the following services: Individual counseling and support; Group counseling and support; Family counseling, training or support; Biochemical assays to identify recent drug use (e.g., urine drug screens); Strategies for relapse prevention to include community and social support systems  in treatment; Life skills;  Crisis contingency planning; Disease Management; and Treatment support activities that have been adapted or specifically designed for persons with physical disabilities, or persons with co-occurring disorders of mental illness and substance abuse/dependence or mental retardation/developmental disability and substance abuse/dependence.  Phone: [phone]   [facility])  Address: [address] [place], [place], [place] [address] Phone: [phone]   [facility] Address: [address] [place], [place] [address] Phone: [phone]  - a combination of group and individual sessions to meet the participants needs. This allows participants to engage in treatment and remain involved in their home and work life. - Transitional housing places program participants in a supportive living environment while they complete a treatment program and work to secure independent housing. - The Substance Abuse Intensive Outpatient Treatment Program at [employer] consists of structured group sessions and individual sessions that are designed to teach participants early recovery and relapse prevention skills. -[facility] works with the [employer] to provide a housing and treatment program for homeless veterans.   Residential Company secretary, [employer].   Address: [address] [place], [place] [address] Phone#: [phone]   : Referrals to RTSA facilities can be made by [employer] and [facility].  Referrals are also accepted from physicians, private providers, hospital emergency rooms, family members, or any person who has knowledge of someone in the need of our services.  The [facility] will also offer the following outpatient services: (Monday through Friday 8am-5pm)   Partial Hospitalization Program (PHP) Substance Abuse Intensive Outpatient Program (SA-IOP) Group Therapy Medication  Management Peer Living Room We also provide (24/7):  Assessments: Our mental health clinician and providers will conduct a focused mental health evaluation, assessing for immediate safety concerns and further mental health needs. Referral: Our team will provide resources and help connect to community based mental health treatment, when indicated, including psychotherapy, psychiatry, and other specialized behavioral health or substance use disorder services (for those not already in treatment). Transitional Care: Our team providers in person bridging and/or telephonic follow-up during the patient's transition to outpatient services.   The [facility] 24-Hour Call Center: [phone] Behavioral Health Crisis Line: [phone] Shelter List:   [facility]) [address] [place], [place] Phone: [phone]   [facility] has been providing emergency shelter to those in need of a permanent residence for over 35 years. The [employer] shelter plays an important role in our community.   There are many life events that can pull someone into a downward spiral towards poverty that is very difficult to get out of. Homelessness is a problem that can affect anyone of [place]. [employer] is a safe and comforting place to stay, especially if you have experienced the hardship of street life.   [employer] provides a single bed and bedding to 100 adult men and women. The shelter welcomes all who are in need of housing, no one in real need is turned away unless space is not available.   While staying at [facility], guests are offered more than just a bed for a night. Hot meals are provided and every guest has access to case management services. Case managers provide assistance with finding housing, employment, or other services that will help them gain stability. Continuous stay is based on availability, capacity, and  progress towards goals.   To contact the front desk of [employer] please call   [phone]  [phone] ext [phone] or ext. [phone].   [facility] [address], [place], [place] [address] Phone: [phone]   [facility] (Women only) [address] [place], [place] [address] Phone: [phone]   [facility] [address], [place] [address] Phone: [phone]   [facility]: [phone]. [address] [place], [place] [address] Phone: [phone]   [facility]ter [address] [place], [place] [address] Check in at 6:00PM for placement at a local shelter) Phone: [phone]     [facility]          -(Please Call) Phone: [phone]   [facility] Eligibility:  Must be drug and alcohol free for at least 14 days or more at the time of application. This program serves males.  Houses Engineer, civil (consulting), Economist who serve six-month terms.  Houses are financially self-supporting; members split house expenses, which average $90.00 to $130.00 per person per week.  Any Resident who relapses must be immediately expelled. Call:  [phone]   Crissie Reese, MSW, LCSW-A, LCAS-A Phone: [phone] Disposition/TOC

## 2022-01-22 NOTE — Discharge Instructions (Addendum)
Substance Abuse Resources ? ? ?[facility] ?- Admissions are currently completed Monday through Friday at 8am; both appointments and walk-ins are accepted.  Any individual that is a [place] resident may present for a substance abuse screening and assessment for admission.  A person may be referred by numerous sources or self-refer.   Potential clients will be screened for medical necessity and appropriateness for the program.  Clients must meet criteria for high-intensity residential treatment services.  If clinically appropriate, a client will continue with the comprehensive clinical assessment and intake process, as well as enrollment in the [facility]. ? ?Address: [address] ?[place], [place] [address] ?Admin Hours: Mon-Fri 8AM to 5PM ?Center Hours: 24/7 ?Phone: [phone] ?Fax: [phone] ? ?[facility]Det[facility]) Facility Based Crisis:  ?These are 3 locations for services: Please call before arrival:   ? ?[facility] ([facility])  ?Address: [address] , [place] [address] ?Phone: [phone] ? ?[facility] ([facility]) ?Address: [address], [place], [place] [address] ?Phone#: [phone] ? ?[facility] ([facility]) ?Address: [address], [place], [place] [address] ?Phone#: [phone] ? ? ?[facility] ([facility]): (offers outpatient therapy and intensive outpatient substance abuse therapy).  ?[address], [place], [place] [address] ?Phone: [phone] ? ?[facility]Men[facility] Division ?Address: [address] [place], [place] [address] ?Phone: [phone] ? ?-The [facility] provides food, shelter and other programs and services to the homeless men of [place] through our men's program. ? ?By offering safe shelter, three meals a day, clean clothing, Biblical counseling, financial planning, vocational training, GED/education and  employment assistance, we've helped mend the shattered lives of many homeless men since opening in 1974. ? ?We have approximately 267 beds available, with a max of 312 beds including mats for emergency situations and currently house an average of 270 men a night. ? ?Prospective Client Check-In Information ?Photo ID Required (State/ Out of State/ [employer]) - if photo ID is not available, clients are required to have a printout of a police/sheriff's criminal history report. ?Help out with chores around the [facility]. ?No sex offender of any type (pending, charged, registered and/or any other sex related offenses) will be permitted to check in. ?Must be willing to abide by all rules, regulations, and policies established by the [facility]. ?The following will be provided - shelter, food, clothing, and biblical counseling. ?If you or someone you know is in need of assistance at our men's shelter in [place], , please call [phone] ext. [phone]. ? ?Women Shelter for [facility] intake hours are Monday-Friday only.  ? ?[facility]: ? ? Phone: [phone] ? ?The [facility] ?SA Intensive Outpatient Program (SAIOP) means structured individual and group addiction activities and services that are provided at an outpatient program designed to assist adult and adolescent consumers to begin recovery and learn skills for recovery maintenance. The [employer] SAIOP program is offered at least 3 hours a day, 3 days a week. SAIOP services shall include a structured program consisting of, but not limited to, the following services: ?Individual counseling and support; Group counseling and support; Family counseling, training or support; Biochemical assays to identify recent drug use (e.g., urine drug screens); Strategies for relapse prevention to include community and social support systems in treatment; Life skills; Crisis contingency planning; Disease Management; and Treatment  support activities that have been adapted or   specifically designed for persons with physical disabilities, or persons with co-occurring disorders of mental illness and substance abuse/dependence or mental retardation/developmental disability and substance abuse/dependence.  Phone: [phone]   [facility])  Address: [address] [place], [place], [place] [address] Phone: [phone]   [facility] Address: [address] [place], [place] [address] Phone: [phone]  - a combination of group and individual sessions to meet the participants needs. This allows participants to engage in treatment and remain involved in their home and work life. - Transitional housing places program participants in a supportive living environment while they complete a treatment program and work to secure independent housing. - The Substance Abuse Intensive Outpatient Treatment Program at [employer] consists of structured group sessions and individual sessions that are designed to teach participants early recovery and relapse prevention skills. -[facility] works with the [employer] to provide a housing and treatment program for homeless veterans.   Residential Company secretary, [employer].   Address: [address] [place], [place] [address] Phone#: [phone]   : Referrals to RTSA facilities can be made by [employer] and [facility].  Referrals are also accepted from physicians, private providers, hospital emergency rooms, family members, or any person who has knowledge of someone in the need of our services.  The [facility] will also offer the following outpatient services: (Monday through Friday 8am-5pm)   Partial Hospitalization Program (PHP) Substance Abuse Intensive Outpatient Program (SA-IOP) Group Therapy Medication Management Peer Living Room We also provide (24/7):  Assessments: Our mental health  clinician and providers will conduct a focused mental health evaluation, assessing for immediate safety concerns and further mental health needs. Referral: Our team will provide resources and help connect to community based mental health treatment, when indicated, including psychotherapy, psychiatry, and other specialized behavioral health or substance use disorder services (for those not already in treatment). Transitional Care: Our team providers in person bridging and/or telephonic follow-up during the patient's transition to outpatient services.   The [facility] 24-Hour Call Center: [phone] Behavioral Health Crisis Line: [phone] Shelter List:   [facility]) [address] [place], [place] Phone: [phone]   [facility] has been providing emergency shelter to those in need of a permanent residence for over 35 years. The [employer] shelter plays an important role in our community.   There are many life events that can pull someone into a downward spiral towards poverty that is very difficult to get out of. Homelessness is a problem that can affect anyone of [place]. [employer] is a safe and comforting place to stay, especially if you have experienced the hardship of street life.   [employer] provides a single bed and bedding to 100 adult men and women. The shelter welcomes all who are in need of housing, no one in real need is turned away unless space is not available.   While staying at [facility], guests are offered more than just a bed for a night. Hot meals are provided and every guest has access to case management services. Case managers provide assistance with finding housing, employment, or other services that will help them gain stability. Continuous stay is based on availability, capacity, and progress towards goals.   To contact the front desk of [facility] please call   [phone]  [phone] ext [phone] or ext. [phone].   [facility] [address] [place], [place] [address] Phone: [phone]   [facility] (Women only) [address] [place], [place] [address] Phone: [phone]   [facility] [address], [place] [address] Phone: [phone]   [facility]: [phone]. [address] [place], [place] [address] Phone: [phone]   [facility] Shelter [address], [place], [place] [address] Check in at 6:00PM for placement at a local shelter) Phone: [phone]     [facility]          -(Please Call) Phone: [phone]   [facility] Eligibility:  Must be drug and alcohol free for at least 14 days or more at the time of application. This program serves males.  Houses Engineer, civil (consulting), Economist who serve six-month terms.  Houses are financially self-supporting; members split house expenses, which average $90.00 to $130.00 per person per week.  Any Resident who relapses must be immediately expelled. Call:  [phone]

## 2022-01-22 NOTE — ED Notes (Signed)
Pt was wanded

## 2022-01-22 NOTE — Progress Notes (Signed)
CSW notes [facility] CSW added resources to [facility]. CSW went to speak with patient and patient was not in stretcher. CSW at this time does not have any bus passes available unless ED Charge station has them available.

## 2022-01-22 NOTE — Consult Note (Signed)
Telepsych Consultation   Reason for Consult:   Psychiatric Reassessment Referring Physician:  Dr. Delora Fuel Location of Patient:    [facility] Location of Provider: Other: virtual home office  Patient Identification: Shari [name] MRN:  [place]vin:[id number] Principal Diagnosis: Encounter for psychiatric assessment Diagnosis:  Principal Problem:   Encounter for psychiatric assessment Active Problems:   Paranoid schizophrenia ([place])  Total Time spent with patient: 30 minutes  Subjective:   Shari [name] is a 66 y.o. female patient presented to the ED for eval of leg pain, was referred to TTS for outpatient mental health resources.  Today the patient states, "I know where I'm at, I'm at [facility] in [place]."  Patient seen via telepsych by this provider; chart reviewed and consulted with Dr.Cinderella on 01/22/22.  Shari [name] is a 66 year old Black female who presented to [facility] emergency department on 2/17 for evaluation of leg pain s/p fall that occurred 2 days prior.  While in the ED she voiced vague sx AVH and psychiatry was consulted.  The ED provider noticed she was not been followed by outpatient psychiatry for mental health and wanted her to see TTS for outpatient resources.  He felt her mental health may have decompensated.  On evaluation today, she is clear and coherent, able to state her name, location and date.  [name] fairly groomed in hospital scrubs but her hair is combed and no apparent decline in ADLs seen today.  She has a history for paranoia and delusional behaviors but I do not see this today; she does not appear to be responding to internal stimulus either.  Apparently she did report audible hallucinations, hearing sounds but not command in nature at admission but she denies this at present.    Shari [name] has a history of paranoid schizophrenia, has been seen at our facility 5 times within the past 3 months for not completing ADLs and positive sx of schizophrenia.  She  was recently admitted to [facility] gero-psychiatric unit on 01/05/2022 and discharged on 01/18/2022 on Zyprexa 10 mg at bedtime and Depakote 500 mg at bedtime and Trazodone 50mg  po at bedtime. She tells me she has not been compliant with these medications because she does not believe she has mental illness; probably explains her decompensating behaviors in the setting of medication non-compliance. Pt was previously given instructions to follow-up with [facility] for outpatient services, but has not done so due to transportation limitations.  Also states is hs homeless, she cannot return to the shelter when she was staying.  It is unclear what led to this but she believes the owner's daughter and her sister were talking and decided she should leave.    Since admission, she has been medically cleared.  She's been cooperative with nursing staff and has not had behavioral concerns.  She has resumed her psychotropic medications and tolerated well.  No sleep or appetite concerns.     Past Psychiatric History: as outlined below  Risk to Self:  no Risk to Others:  no Prior Inpatient Therapy:  yes Prior Outpatient Therapy:  unknown  Past Medical History:  Past Medical History:  Diagnosis Date   Eye globe prosthesis    GERD (gastroesophageal reflux disease)    History of blood transfusion 1973   "related to abscess burst in my stomach"   Hyperlipemia    Hyperlipidemia    Hypertension    Osteoarthritis of back    Lowerback    SVD (spontaneous vaginal delivery)  x 1, baby died at 2 wks of age   Type II diabetes mellitus (HCC)     Past Surgical History:  Procedure Laterality Date   APPENDECTOMY     COLONOSCOPY     DILATATION & CURETTAGE/HYSTEROSCOPY WITH MYOSURE N/A 02/25/2015   Procedure: DILATATION & CURETTAGE/HYSTEROSCOPY WITH MYOSURE;  Surgeon: [name], MD;  Location: [facility] ORS;  Service: Gynecology;  Laterality: N/A;   DILATION AND CURETTAGE OF UTERUS     ENUCLEATION Right 03/16/2017    ENUCLEATION Right 03/16/2017   Procedure: ENUCLEATION RIGHT EYE;  Surgeon: [name], MD;  Location: [facility] OR;  Service: Ophthalmology;  Laterality: Right;   EYE SURGERY     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6, 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   EYE SURGERY Right ~ 1974   "S/P initial eye injury; scissors stuck in my eye""   LAPAROSCOPIC CHOLECYSTECTOMY     SHOULDER ARTHROSCOPY WITH ROTATOR CUFF REPAIR Left    wire stitches per patient   SHOULDER ARTHROSCOPY WITH ROTATOR CUFF REPAIR Right    Family History:  Family History  Problem Relation Age of Onset   Diabetes Mother    CAD Mother    Lung cancer Father    Family Psychiatric  History: unknown Social History:  Social History   Substance and Sexual Activity  Alcohol Use Yes   Comment: 03/16/2017 "nothing since 2013"     Social History   Substance and Sexual Activity  Drug Use No    Social History   Socioeconomic History   Marital status: Widowed    Spouse name: Not on file   Number of children: Not on file   Years of education: Not on file   Highest education level: Not on file  Occupational History   Not on file  Tobacco Use   Smoking status: Former    Packs/day: 0.50    Years: 40.00    Pack years: 20.00    Types: Cigarettes    Quit date: 2016    Years since quitting: 7.1   Smokeless tobacco: Never  Vaping Use   Vaping Use: Never used  Substance and Sexual Activity   Alcohol use: Yes    Comment: 03/16/2017 "nothing since 2013"   Drug use: No   Sexual activity: Not Currently    Birth control/protection: Post-menopausal  Other Topics Concern   Not on file  Social History Narrative   Not on file   Social Determinants of Health   Financial Resource Strain: Not on file  Food Insecurity: Not on file  Transportation Needs: Not on file  Physical Activity: Not on file  Stress: Not on file  Social Connections: Not on file   Additional Social History:    Allergies:   Allergies  Allergen Reactions   Aspirin Hives and Itching    Chocolate Hives and Itching   Cocoa Itching and Rash   Penicillins Hives, Itching, Swelling and Other (See Comments)    Has patient had a PCN reaction causing IMMEDIATE RASH, FACIAL/TONGUE/THROAT SWELLING, SOB, OR LIGHTHEADEDNESS WITH HYPOTENSION:  #  #  #  YES  #  #  #  Has patient had a PCN reaction causing severe rash involving mucus membranes or skin necrosis: No Has patient had a PCN reaction that required hospitalization No Has patient had a PCN reaction occurring within the last 10 years: No If all of the above answers are "NO", then may proceed with Cephalosporin use.    Sulfa Antibiotics Hives and Itching  Labs:  Results for orders placed or performed during the hospital encounter of 01/20/22 (from the past 48 hour(s))  Resp Panel by RT-PCR (Flu A&B, Covid) Nasopharyngeal Swab     Status: None   Collection Time: 01/21/22  7:20 AM   Specimen: Nasopharyngeal Swab; Nasopharyngeal(NP) swabs in vial transport medium  Result Value Ref Range   SARS Coronavirus 2 by RT PCR NEGATIVE NEGATIVE    Comment: (NOTE) SARS-CoV-2 target nucleic acids are NOT DETECTED.  The SARS-CoV-2 RNA is generally detectable in upper respiratory specimens during the acute phase of infection. The lowest concentration of SARS-CoV-2 viral copies this assay can detect is 138 copies/mL. A negative result does not preclude SARS-Cov-2 infection and should not be used as the sole basis for treatment or other patient management decisions. A negative result may occur with  improper specimen collection/handling, submission of specimen other than nasopharyngeal swab, presence of viral mutation(s) within the areas targeted by this assay, and inadequate number of viral copies(<138 copies/mL). A negative result must be combined with clinical observations, patient history, and epidemiological information. The expected result is Negative.  Fact Sheet for Patients:  EntrepreneurPulse.com.au  Fact  Sheet for Healthcare Providers:  IncredibleEmployment.be  This test is no t yet approved or cleared by the [place] FDA and  has been authorized for detection and/or diagnosis of SARS-CoV-2 by FDA under an Emergency Use Authorization (EUA). This EUA will remain  in effect (meaning this test can be used) for the duration of the COVID-19 declaration under Section 564(b)(1) of the Act, 21 U.S.C.section 360bbb-3(b)(1), unless the authorization is terminated  or revoked sooner.       Influenza A by PCR NEGATIVE NEGATIVE   Influenza B by PCR NEGATIVE NEGATIVE    Comment: (NOTE) The Xpert Xpress SARS-CoV-2/FLU/RSV plus assay is intended as an aid in the diagnosis of influenza from Nasopharyngeal swab specimens and should not be used as a sole basis for treatment. Nasal washings and aspirates are unacceptable for Xpert Xpress SARS-CoV-2/FLU/RSV testing.  Fact Sheet for Patients: EntrepreneurPulse.com.au  Fact Sheet for Healthcare Providers: IncredibleEmployment.be  This test is not yet approved or cleared by the [place] FDA and has been authorized for detection and/or diagnosis of SARS-CoV-2 by FDA under an Emergency Use Authorization (EUA). This EUA will remain in effect (meaning this test can be used) for the duration of the COVID-19 declaration under Section 564(b)(1) of the Act, 21 U.S.C. section 360bbb-3(b)(1), unless the authorization is terminated or revoked.  Performed at [facility], [place] [address], [place], [place] [address]     Medications:  Current Facility-Administered Medications  Medication Dose Route Frequency Provider Last Rate Last Admin   acetaminophen (TYLENOL) tablet 650 mg  650 mg Oral [id number] PRN Delora Fuel, MD   [id number] mg at 01/22/22 0904   alum & mag hydroxide-simeth (MAALOX/MYLANTA) 200-200-20 MG/5ML suspension 30 mL  30 mL Oral [id number] PRN Delora Fuel, MD       atenolol (TENORMIN) tablet  12.5 mg  [id number] mg Oral Daily Delora Fuel, MD   [id number] mg at 01/21/22 1009   atorvastatin (LIPITOR) tablet 40 mg  40 mg Oral QHS Delora Fuel, MD   40 mg at 01/21/22 2222   divalproex (DEPAKOTE) DR tablet 500 mg  [id number] mg Oral QHS Delora Fuel, MD   [id number] mg at 01/21/22 2222   lisinopril (ZESTRIL) tablet 10 mg  10 mg Oral QPM Delora Fuel, MD   10 mg at 01/21/22 2002   methimazole (  TAPAZOLE) tablet 10 mg  10 mg Oral Daily Delora Fuel, MD   10 mg at 01/21/22 1009   OLANZapine (ZYPREXA) tablet 10 mg  10 mg Oral QHS Delora Fuel, MD   10 mg at 01/21/22 2223   ondansetron (ZOFRAN) tablet 4 mg  4 mg Oral [id number] PRN Delora Fuel, MD       pantoprazole (PROTONIX) EC tablet 40 mg  40 mg Oral BID [place] More[place], MD   40 mg at 01/22/22 [id number]   traZODone (DESYREL) tablet 50 mg  50 mg Oral QHS PRN Delora Fuel, MD       Current Outpatient Medications  Medication Sig Dispense Refill   atenolol (TENORMIN) 25 MG tablet Take 0.5 tablets (12.5 mg total) by mouth daily. 30 tablet 0   atorvastatin (LIPITOR) 40 MG tablet Take 1 tablet (40 mg total) by mouth daily. (Patient taking differently: Take 40 mg by mouth at bedtime.) 30 tablet 0   divalproex (DEPAKOTE) 500 MG DR tablet Take 1 tablet (500 mg total) by mouth at bedtime. 30 tablet 2   ibuprofen (ADVIL) 800 MG tablet Take 800 mg by mouth every 8 (eight) hours as needed for headache or moderate pain.     lisinopril (ZESTRIL) 10 MG tablet Take 1 tablet (10 mg total) by mouth daily. (Patient taking differently: Take 10 mg by mouth every evening.) 30 tablet 0   methimazole (TAPAZOLE) 10 MG tablet Take 1 tablet (10 mg total) by mouth daily. 30 tablet 0   OLANZapine (ZYPREXA) 10 MG tablet Take 1 tablet (10 mg total) by mouth at bedtime. 30 tablet 2   traZODone (DESYREL) 50 MG tablet Take 1 tablet (50 mg total) by mouth at bedtime. (Patient taking differently: Take 50 mg by mouth at bedtime as needed for sleep.) 30 tablet 2   pantoprazole (PROTONIX) 40 MG tablet Take 1 tablet  (40 mg total) by mouth 2 (two) times daily before a meal. 60 tablet 0    Musculoskeletal: Strength & Muscle Tone: within normal limits Gait & Station: normal Patient leans: N/A   Psychiatric Specialty Exam:  Presentation  General Appearance: Casual (hair is combed, no apparent ADL concerns)  Eye Contact:Good  Speech:Clear and Coherent; Normal Rate  Speech Volume:Normal  Handedness:Right   Mood and Affect  Mood:Euthymic  Affect:Appropriate; Congruent; Constricted   Thought Process  Thought Processes:Coherent; Goal Directed  Descriptions of Associations:Intact  Orientation:Full (Time, Place and Person)  Thought Content:Logical  History of Schizophrenia/Schizoaffective disorder:Yes  Duration of Psychotic Symptoms:Greater than six months  Hallucinations:Hallucinations: -- (history of hearing voices, "making sounds" was present on admission but currently denies.  Denies command in nature)  Ideas of Reference:None  Suicidal Thoughts:Suicidal Thoughts: No  Homicidal Thoughts:Homicidal Thoughts: No   Sensorium  Memory:Immediate Good; Recent Good; Remote Good  Judgment:Fair (at baseline in the setting of dx of schizophrenia and medication non-adherence)  Insight:Fair   Executive Functions  Concentration:Good  Attention Span:Good  [place] of Knowledge:Good  Language:Good   Psychomotor Activity  Psychomotor Activity:Psychomotor Activity: Normal   Assets  Assets:Communication Skills; Desire for Improvement; Financial Resources/Insurance   Sleep  Sleep:Sleep: Good Number of Hours of Sleep: 7    Physical Exam: Physical Exam Constitutional:      Appearance: Normal appearance.  Cardiovascular:     Rate and Rhythm: Normal rate.     Pulses: Normal pulses.  Pulmonary:     Effort: Pulmonary effort is normal.  Musculoskeletal:     Cervical back: Normal range of motion.  Neurological:     General: No focal deficit present.      Mental Status: [name] alert and oriented to person, place, and time.  Psychiatric:        Mood and Affect: Mood normal.        Behavior: Behavior normal.        Thought Content: Thought content normal.   Review of Systems  Constitutional: Negative.   HENT: Negative.    Eyes: Negative.   Respiratory: Negative.    Cardiovascular: Negative.   Gastrointestinal: Negative.   Genitourinary: Negative.   Musculoskeletal:  Positive for falls (2 days prior with subsequent leg pain).  Skin: Negative.   Psychiatric/Behavioral:  Negative for hallucinations, substance abuse and suicidal ideas. The patient does not have insomnia.   Blood pressure (!) 101/57, pulse 71, temperature 98.2 F (36.8 C), temperature source Oral, resp. rate 16, weight 61.7 kg, SpO2 100 %. Body mass index is 23.34 kg/m.  Treatment Plan Summary: Patient was recently discharged on 01/18/2022 from [facility] gero-psych admission, meds were adjusted and pt was recommended for outpatient care.  To date she has not followed-up with [facility], d/t transportation problems.  She has schizophrenia so prone to positive symptoms with medication non-compliance.  Pt restarted on her meds yesterday, is clear and coherent, no SI, HI or psychosis warranting readmission at this time.  She should continue zyprexa, trazodone, and depakote and follow-up with [facility] for outpatient management.   W to update discharge AVS to reflect above; will enter [facility] consult for homelessness and bus voucher.   Plan- As per above assessment, there are no current grounds for involuntary commitment at this time. Patient is not currently interested in inpatient services but expresses agreement to continue outpatient treatment., we have reviewed importance of medication compliance.  Above discussed with patient concordance.   Disposition: No evidence of imminent risk to self or others at present.   Patient does not meet criteria for psychiatric inpatient admission. Supportive  therapy provided about ongoing stressors. Discussed crisis plan, support from social network, calling 911, coming to the Emergency Department, and calling Suicide Hotline.  This service was provided via telemedicine using a 2-way, interactive audio and video technology.  Names of all persons participating in this telemedicine service and their role in this encounter. Name: Shari [name] Role: Patient  Name: Merlyn Lot Role: PMHNP  Name: Cloyde Reams Cinderella Role: Psychiatrist    Mallie Darting, NP 01/22/2022 11:36 AM

## 2022-01-25 ENCOUNTER — Other Ambulatory Visit (HOSPITAL_COMMUNITY): Payer: Self-pay

## 2022-02-03 ENCOUNTER — Other Ambulatory Visit: Payer: Self-pay

## 2022-05-01 DIAGNOSIS — M545 Low back pain, unspecified: Secondary | ICD-10-CM | POA: Diagnosis not present

## 2022-05-01 DIAGNOSIS — J9811 Atelectasis: Secondary | ICD-10-CM | POA: Diagnosis not present

## 2022-05-01 DIAGNOSIS — R0602 Shortness of breath: Secondary | ICD-10-CM | POA: Diagnosis not present

## 2022-05-01 DIAGNOSIS — E876 Hypokalemia: Secondary | ICD-10-CM | POA: Diagnosis not present

## 2022-05-01 DIAGNOSIS — M79605 Pain in left leg: Secondary | ICD-10-CM | POA: Diagnosis not present

## 2022-05-01 DIAGNOSIS — M79662 Pain in left lower leg: Secondary | ICD-10-CM | POA: Diagnosis not present

## 2022-05-01 DIAGNOSIS — Z5900 Homelessness unspecified: Secondary | ICD-10-CM | POA: Diagnosis not present

## 2022-05-01 DIAGNOSIS — M5459 Other low back pain: Secondary | ICD-10-CM | POA: Diagnosis not present

## 2022-05-01 DIAGNOSIS — Z743 Need for continuous supervision: Secondary | ICD-10-CM | POA: Diagnosis not present

## 2022-05-02 ENCOUNTER — Emergency Department (HOSPITAL_BASED_OUTPATIENT_CLINIC_OR_DEPARTMENT_OTHER)
Admission: EM | Admit: 2022-05-02 | Discharge: 2022-05-02 | Disposition: A | Discharge status: Home / Self Care | Payer: Medicare Other | Attending: Emergency Medicine | Admitting: Emergency Medicine

## 2022-05-02 ENCOUNTER — Other Ambulatory Visit: Payer: Self-pay

## 2022-05-02 ENCOUNTER — Encounter (HOSPITAL_BASED_OUTPATIENT_CLINIC_OR_DEPARTMENT_OTHER): Payer: Self-pay

## 2022-05-02 DIAGNOSIS — R1030 Lower abdominal pain, unspecified: Secondary | ICD-10-CM | POA: Diagnosis not present

## 2022-05-02 NOTE — ED Provider Notes (Signed)
[facility] EMERGENCY DEPARTMENT Provider Note   CSN: [id number] Arrival date & time: 05/02/22  6948     History  Chief Complaint  Patient presents with   Abdominal Pain    Shari [name] is a 66 y.o. female.  66 yo F with a chief complaint of abdominal pain.  She tells me this been going on for a few days.  Lower abdomen seems to come and go.  No fevers or chills.  No diarrhea no nausea or vomiting no urinary symptoms.   Abdominal Pain     Home Medications Prior to Admission medications   Medication Sig Start Date End Date Taking? Authorizing Provider  atenolol (TENORMIN) 25 MG tablet Take 0.5 tablets (12.5 mg total) by mouth daily. 01/19/22   Sarina Ill, DO  atorvastatin (LIPITOR) 40 MG tablet Take 1 tablet (40 mg total) by mouth daily. Patient taking differently: Take 40 mg by mouth at bedtime. 01/18/22 02/17/22  Sarina Ill, DO  divalproex (DEPAKOTE) 500 MG DR tablet Take 1 tablet (500 mg total) by mouth at bedtime. 01/18/22 04/18/22  Sarina Ill, DO  ibuprofen (ADVIL) 800 MG tablet Take 800 mg by mouth every 8 (eight) hours as needed for headache or moderate pain.    [provider]  lisinopril (ZESTRIL) 10 MG tablet Take 1 tablet (10 mg total) by mouth daily. Patient taking differently: Take 10 mg by mouth every evening. 01/18/22 02/17/22  Sarina Ill, DO  methimazole (TAPAZOLE) 10 MG tablet Take 1 tablet (10 mg total) by mouth daily. 01/18/22 02/17/22  Sarina Ill, DO  OLANZapine (ZYPREXA) 10 MG tablet Take 1 tablet (10 mg total) by mouth at bedtime. 01/18/22 04/18/22  Sarina Ill, DO  pantoprazole (PROTONIX) 40 MG tablet Take 1 tablet (40 mg total) by mouth 2 (two) times daily before a meal. 01/18/22 02/17/22  Sarina Ill, DO  traZODone (DESYREL) 50 MG tablet Take 1 tablet (50 mg total) by mouth at bedtime. Patient taking differently: Take 50 mg by mouth at bedtime as needed for  sleep. 01/18/22 04/18/22  Sarina Ill, DO      Allergies    Aspirin, Chocolate, Cocoa, Penicillins, and Sulfa antibiotics    Review of Systems   Review of Systems  Gastrointestinal:  Positive for abdominal pain.   Physical Exam Updated Vital Signs BP 108/87 (BP Location: Right Arm)   Pulse 100   Temp 98.1 F (36.7 C) (Oral)   Ht 5\' 4"  (1.626 m)   Wt 65 kg   SpO2 98%   BMI 24.60 kg/m  Physical Exam Vitals and nursing note reviewed.  Constitutional:      General: [name] not in acute distress.    Appearance: [name] well-developed. [name] not diaphoretic.  HENT:     Head: Normocephalic and atraumatic.  Eyes:     Pupils: Pupils are equal, round, and reactive to light.  Cardiovascular:     Rate and Rhythm: Normal rate and regular rhythm.     Heart sounds: No murmur heard.   No friction rub. No gallop.  Pulmonary:     Effort: Pulmonary effort is normal.     Breath sounds: No wheezing or rales.  Abdominal:     General: There is no distension.     Palpations: Abdomen is soft.     Tenderness: There is no abdominal tenderness.     Comments: Points to her lower abdomen as area of pain.  No pain  on palpation.  Musculoskeletal:        General: No tenderness.     Cervical back: Normal range of motion and neck supple.  Skin:    General: Skin is warm and dry.  Neurological:     Mental Status: [name] alert and oriented to person, place, and time.  Psychiatric:        Behavior: Behavior normal.    ED Results / Procedures / Treatments   Labs (all labs ordered are listed, but only abnormal results are displayed) Labs Reviewed - No data to display  EKG None  Radiology No results found.  Procedures Procedures    Medications Ordered in ED Medications - No data to display  ED Course/ Medical Decision Making/ A&P                           Medical Decision Making  65 yo F with a chief complaint of lower abdominal discomfort.  This been going on for some days  now.  She has benign abdominal exam.  On record review the patient was actually just discharged a matter of hours ago from a nearby hospital.  She had lab work that I independently interpreted without concerning finding, no significant leukocytosis no significant anemia LFTs are unremarkable.  Her UA independently interpreted by me is without infection.  It appears that she got a cab voucher from the other hospital and came directly here.  I am concerned she may be here for secondary gain.  I think is unlikely she has an emergent medical condition based on her history my exam and the prior documentation.  Of note she did not mention abdominal pain at her visit and had complained of back pain.  I will discharge the patient home.  PCP follow-up.  7:33 AM:  I have discussed the diagnosis/risks/treatment options with the patient.  Evaluation and diagnostic testing in the emergency department does not suggest an emergent condition requiring admission or immediate intervention beyond what has been performed at this time.  They will follow up with  PCP. We also discussed returning to the ED immediately if new or worsening sx occur. We discussed the sx which are most concerning (e.g., sudden worsening pain, fever, inability to tolerate by mouth) that necessitate immediate return. Medications administered to the patient during their visit and any new prescriptions provided to the patient are listed below.  Medications given during this visit Medications - No data to display   The patient appears reasonably screen and/or stabilized for discharge and I doubt any other medical condition or other [facility] requiring further screening, evaluation, or treatment in the ED at this time prior to discharge.          Final Clinical Impression(s) / ED Diagnoses Final diagnoses:  Lower abdominal pain    Rx / DC Orders ED Discharge Orders     None         Melene Plan, DO 05/02/22 [phone]

## 2022-05-02 NOTE — Discharge Instructions (Signed)
Take Tylenol as needed for your pain.  Please follow-up with your family doctor in the office.

## 2022-05-02 NOTE — ED Triage Notes (Signed)
Pt arrives via cab, Pt ambulatory to room with steady gate. C/O lower dull abd pain and lower back pain. Denies n/v/d, nad in triage.

## 2022-05-30 DIAGNOSIS — R000 Tachycardia, unspecified: Secondary | ICD-10-CM | POA: Diagnosis not present

## 2022-05-30 DIAGNOSIS — I499 Cardiac arrhythmia, unspecified: Secondary | ICD-10-CM | POA: Diagnosis not present

## 2022-05-30 DIAGNOSIS — R0789 Other chest pain: Secondary | ICD-10-CM | POA: Diagnosis not present

## 2022-05-30 DIAGNOSIS — R079 Chest pain, unspecified: Secondary | ICD-10-CM | POA: Diagnosis not present

## 2022-05-30 DIAGNOSIS — R002 Palpitations: Secondary | ICD-10-CM | POA: Diagnosis not present

## 2022-05-30 DIAGNOSIS — R0602 Shortness of breath: Secondary | ICD-10-CM | POA: Diagnosis not present

## 2022-05-30 DIAGNOSIS — M79662 Pain in left lower leg: Secondary | ICD-10-CM | POA: Diagnosis not present

## 2022-05-30 DIAGNOSIS — Z743 Need for continuous supervision: Secondary | ICD-10-CM | POA: Diagnosis not present

## 2022-05-31 DIAGNOSIS — R000 Tachycardia, unspecified: Secondary | ICD-10-CM | POA: Diagnosis not present

## 2022-05-31 DIAGNOSIS — R079 Chest pain, unspecified: Secondary | ICD-10-CM | POA: Diagnosis not present

## 2022-06-15 ENCOUNTER — Encounter (HOSPITAL_COMMUNITY): Payer: Self-pay

## 2022-06-15 ENCOUNTER — Other Ambulatory Visit: Payer: Self-pay

## 2022-06-15 ENCOUNTER — Emergency Department (HOSPITAL_COMMUNITY)
Admission: EM | Admit: 2022-06-15 | Discharge: 2022-06-16 | Disposition: A | Discharge status: Home / Self Care | Payer: Medicare Other | Attending: Emergency Medicine | Admitting: Emergency Medicine

## 2022-06-15 ENCOUNTER — Emergency Department (HOSPITAL_COMMUNITY): Payer: Medicare Other

## 2022-06-15 DIAGNOSIS — R6889 Other general symptoms and signs: Secondary | ICD-10-CM | POA: Diagnosis not present

## 2022-06-15 DIAGNOSIS — F17210 Nicotine dependence, cigarettes, uncomplicated: Secondary | ICD-10-CM | POA: Insufficient documentation

## 2022-06-15 DIAGNOSIS — R0789 Other chest pain: Secondary | ICD-10-CM | POA: Insufficient documentation

## 2022-06-15 DIAGNOSIS — Z79899 Other long term (current) drug therapy: Secondary | ICD-10-CM | POA: Diagnosis not present

## 2022-06-15 DIAGNOSIS — I10 Essential (primary) hypertension: Secondary | ICD-10-CM | POA: Insufficient documentation

## 2022-06-15 DIAGNOSIS — Z743 Need for continuous supervision: Secondary | ICD-10-CM | POA: Diagnosis not present

## 2022-06-15 DIAGNOSIS — R079 Chest pain, unspecified: Secondary | ICD-10-CM

## 2022-06-15 DIAGNOSIS — E876 Hypokalemia: Secondary | ICD-10-CM | POA: Diagnosis not present

## 2022-06-15 DIAGNOSIS — I499 Cardiac arrhythmia, unspecified: Secondary | ICD-10-CM | POA: Diagnosis not present

## 2022-06-15 DIAGNOSIS — E119 Type 2 diabetes mellitus without complications: Secondary | ICD-10-CM | POA: Diagnosis not present

## 2022-06-15 LAB — CBC WITH DIFFERENTIAL/PLATELET
Abs Immature Granulocytes: 0.01 10*3/uL (ref 0.00–0.07)
Basophils Absolute: 0 10*3/uL (ref 0.0–0.1)
Basophils Relative: 0 %
Eosinophils Absolute: 0 10*3/uL (ref 0.0–0.5)
Eosinophils Relative: 0 %
HCT: 41.4 % (ref 36.0–46.0)
Hemoglobin: 13.8 g/dL (ref 12.0–15.0)
Immature Granulocytes: 0 %
Lymphocytes Relative: 33 %
Lymphs Abs: 1.5 10*3/uL (ref 0.7–4.0)
MCH: 27.9 pg (ref 26.0–34.0)
MCHC: 33.3 g/dL (ref 30.0–36.0)
MCV: 83.6 fL (ref 80.0–100.0)
Monocytes Absolute: 0.2 10*3/uL (ref 0.1–1.0)
Monocytes Relative: 5 %
Neutro Abs: 2.7 10*3/uL (ref 1.7–7.7)
Neutrophils Relative %: 62 %
Platelets: 280 10*3/uL (ref 150–400)
RBC: 4.95 MIL/uL (ref 3.87–5.11)
RDW: 13.6 % (ref 11.5–15.5)
WBC: 4.5 10*3/uL (ref 4.0–10.5)
nRBC: 0 % (ref 0.0–0.2)

## 2022-06-15 LAB — COMPREHENSIVE METABOLIC PANEL
ALT: 14 U/L (ref 0–44)
AST: 19 U/L (ref 15–41)
Albumin: 4 g/dL (ref 3.5–5.0)
Alkaline Phosphatase: 118 U/L (ref 38–126)
Anion gap: 13 (ref 5–15)
BUN: 9 mg/dL (ref 8–23)
CO2: 20 mmol/L — ABNORMAL LOW (ref 22–32)
Calcium: 10.1 mg/dL (ref 8.9–10.3)
Chloride: 106 mmol/L (ref 98–111)
Creatinine, Ser: 0.86 mg/dL (ref 0.44–1.00)
GFR, Estimated: >60 mL/min (ref >60)
Glucose, Bld: 91 mg/dL (ref 70–99)
Potassium: 3.2 mmol/L — ABNORMAL LOW (ref 3.5–5.1)
Sodium: 139 mmol/L (ref 135–145)
Total Bilirubin: 1.2 mg/dL (ref 0.3–1.2)
Total Protein: 7.3 g/dL (ref 6.5–8.1)

## 2022-06-15 LAB — TROPONIN I (HIGH SENSITIVITY)
Troponin I (High Sensitivity): 4 ng/L (ref <18)
Troponin I (High Sensitivity): 5 ng/L (ref <18)

## 2022-06-15 NOTE — ED Provider Triage Note (Signed)
Emergency Medicine Provider Triage Evaluation Note  Shari [name] , a 66 y.o. female  was evaluated in triage.  Pt complains of chest pain.  Reports chest pain started 1 hour ago while at rest.  Pain is located to the center chest and wraps around to her back.  Patient is unable to describe what the pain feels like.  Denies any associated nausea, vomiting, diaphoresis, lightheadedness, dizziness, syncope, palpitations.  Patient is allergic to ASA.  Patient received nitro x1 with EMS with no change in pain.  Review of Systems  Positive: See above Negative: See above  Physical Exam  BP 97/79   Pulse (!) 101   Temp 98.4 F (36.9 C) (Oral)   Resp (!) 40   Ht 5\' 4"  (1.626 m)   Wt 64.9 kg   SpO2 100%   BMI 24.55 kg/m  Gen:   Awake, no distress   Resp:  Normal effort, clear to auscultation bilaterally MSK:   Moves extremities without difficulty; no swelling or tenderness to bilateral lower extremities Other:  +2 radial pulse bilaterally  Medical Decision Making  Medically screening exam initiated at 1:58 PM.  Appropriate orders placed.  Shari [name] was informed that the remainder of the evaluation will be completed by another provider, this initial triage assessment does not replace that evaluation, and the importance of remaining in the ED until their evaluation is complete.  ACS work-up initiated   Shari [name] 06/15/22 1359

## 2022-06-15 NOTE — ED Triage Notes (Signed)
Patient is homeless complaining of chest pain allergic to asa had one nitro. Radiates to back. No sob n.v diaphoretic.  Patient pain 3/10

## 2022-06-16 ENCOUNTER — Other Ambulatory Visit (HOSPITAL_COMMUNITY): Payer: Self-pay

## 2022-06-16 LAB — D-DIMER, QUANTITATIVE: D-Dimer, Quant: 0.55 ug/mL-FEU — ABNORMAL HIGH (ref 0.00–0.50)

## 2022-06-16 MED ORDER — POTASSIUM CHLORIDE CRYS ER 20 MEQ PO TBCR
20.0000 meq | EXTENDED_RELEASE_TABLET | Freq: Two times a day (BID) | ORAL | 0 refills | Status: DC
  Filled 2022-06-16: qty 20, 10d supply, fill #0

## 2022-06-16 MED ORDER — POTASSIUM CHLORIDE CRYS ER 20 MEQ PO TBCR
40.0000 meq | EXTENDED_RELEASE_TABLET | Freq: Once | ORAL | Status: AC
  Administered 2022-06-16: 40 meq via ORAL
  Filled 2022-06-16: qty 2

## 2022-06-16 MED ORDER — KETOROLAC TROMETHAMINE 30 MG/ML IJ SOLN
15.0000 mg | Freq: Once | INTRAMUSCULAR | Status: AC
  Administered 2022-06-16: 15 mg via INTRAVENOUS
  Filled 2022-06-16: qty 1

## 2022-06-16 NOTE — ED Provider Notes (Signed)
[facility] EMERGENCY DEPARTMENT Provider Note   CSN: [id number] Arrival date & time: 06/15/22  1332     History  Chief Complaint  Patient presents with   Chest Pain    Shari [name] is a 66 y.o. female.  The history is provided by the patient.  Chest Pain She has history of hypertension, diabetes, hyperlipidemia, paroxysmal atrial fibrillation not on anticoagulation, Graves' disease, schizophrenia and comes in complaining of left-sided chest pain which started this afternoon.  Pain does radiate into the back and somewhat to the epigastric area.  There is no associated dyspnea, nausea, diaphoresis.  Pain is slightly worse when she takes a deep breath.  She denies fever or chills and denies cough.  [name] a cigarette smoker.  There is a positive family history for premature coronary atherosclerosis.   Home Medications Prior to Admission medications   Medication Sig Start Date End Date Taking? Authorizing Provider  atenolol (TENORMIN) 25 MG tablet Take 0.5 tablets (12.5 mg total) by mouth daily. 01/19/22   Sarina Ill, DO  atorvastatin (LIPITOR) 40 MG tablet Take 1 tablet (40 mg total) by mouth daily. Patient taking differently: Take 40 mg by mouth at bedtime. 01/18/22 02/17/22  Sarina Ill, DO  divalproex (DEPAKOTE) 500 MG DR tablet Take 1 tablet (500 mg total) by mouth at bedtime. 01/18/22 04/18/22  Sarina Ill, DO  ibuprofen (ADVIL) 800 MG tablet Take 800 mg by mouth every 8 (eight) hours as needed for headache or moderate pain.    [provider]  lisinopril (ZESTRIL) 10 MG tablet Take 1 tablet (10 mg total) by mouth daily. Patient taking differently: Take 10 mg by mouth every evening. 01/18/22 02/17/22  Sarina Ill, DO  methimazole (TAPAZOLE) 10 MG tablet Take 1 tablet (10 mg total) by mouth daily. 01/18/22 02/17/22  Sarina Ill, DO  OLANZapine (ZYPREXA) 10 MG tablet Take 1 tablet (10 mg total) by mouth  at bedtime. 01/18/22 04/18/22  Sarina Ill, DO  pantoprazole (PROTONIX) 40 MG tablet Take 1 tablet (40 mg total) by mouth 2 (two) times daily before a meal. 01/18/22 02/17/22  Sarina Ill, DO  traZODone (DESYREL) 50 MG tablet Take 1 tablet (50 mg total) by mouth at bedtime. Patient taking differently: Take 50 mg by mouth at bedtime as needed for sleep. 01/18/22 04/18/22  Sarina Ill, DO      Allergies    Aspirin, Chocolate, Cocoa, Penicillins, and Sulfa antibiotics    Review of Systems   Review of Systems  Cardiovascular:  Positive for chest pain.  All other systems reviewed and are negative.   Physical Exam Updated Vital Signs BP (!) 113/58   Pulse 72   Temp 98 F (36.7 C) (Oral)   Resp 16   Ht 5\' 4"  (1.626 m)   Wt 64.9 kg   SpO2 100%   BMI 24.55 kg/m  Physical Exam Vitals and nursing note reviewed.   66 year old female, resting comfortably and in no acute distress. Vital signs are normal. Oxygen saturation is 100%, which is normal. Head is normocephalic and atraumatic.  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 Oropharynx is clear. Neck is nontender and supple without adenopathy or JVD. Back is nontender and there is no CVA tenderness. Lungs are clear without rales, wheezes, or rhonchi. Chest is mildly tender in the left parasternal area.  There is no crepitus.  Heart has regular rate and rhythm without murmur. Abdomen is soft, flat, nontender. Extremities have no cyanosis or edema, full range of motion is present. Skin is warm and dry without rash. Neurologic: Mental status is normal, cranial nerves are intact, moves all extremities equally.  ED Results / Procedures / Treatments   Labs (all labs ordered are listed, but only abnormal results are displayed) Labs Reviewed  COMPREHENSIVE METABOLIC PANEL - Abnormal; Notable for the following components:      Result Value    Potassium 3.2 (*)    CO2 20 (*)    All other components within normal limits  CBC WITH DIFFERENTIAL/PLATELET  TROPONIN I (HIGH SENSITIVITY)  TROPONIN I (HIGH SENSITIVITY)    EKG EKG Interpretation  Date/Time:  Wednesday June 15 2022 13:46:42 EDT Ventricular Rate:  102 PR Interval:  128 QRS Duration: 70 QT Interval:  356 QTC Calculation: 463 R Axis:   81 Text Interpretation: Sinus tachycardia Premature atrial complexes Right atrial enlargement Minimal voltage criteria for LVH, may be normal variant ( Sokolow-Lyon ) T wave abnormality, consider inferior ischemia T wave abnormality, consider anterolateral ischemia Abnormal ECG When compared with ECG of 04-Jan-2022 21:11, No significant change was found Confirmed by Dione Booze ([id number]) on 06/16/2022 12:50:06 AM  Radiology DG Chest 2 View  Result Date: 06/15/2022 CLINICAL DATA:  Chest pain EXAM: CHEST - 2 VIEW COMPARISON:  05/30/2022 FINDINGS: The heart size and mediastinal contours are within normal limits. Both lungs are clear. The visualized skeletal structures are unremarkable. IMPRESSION: No active cardiopulmonary disease. Electronically Signed   By: Ernie Avena M.D.   On: 06/15/2022 14:31    Procedures Procedures  Cardiac monitor shows sinus rhythm with PACs, per my interpretation.  Medications Ordered in ED Medications - No data to display  ED Course/ Medical Decision Making/ A&P                           Medical Decision Making Amount and/or Complexity of Data Reviewed Labs: ordered.  Risk Prescription drug management.   Left-sided chest pain of uncertain cause.  Consider ACS, pulmonary embolism, musculoskeletal pain.  I have reviewed and interpreted the ECG, and my interpretation is borderline sinus tachycardia with PACs with nonspecific T wave abnormalities which are unchanged from prior.  Chest x-ray shows no active cardiopulmonary disease.  I have independently viewed the images, and agree with radiologist  interpretation.  I have reviewed and interpreted the laboratory tests and my interpretation is mild hypokalemia.  Troponin is normal x2, no evidence of ACS.  Oral potassium is ordered.  I have ordered D-dimer to rule out pulmonary embolism, a dose of ketorolac to treat possible chest wall pain.  Of note, she is allergic to aspirin but tells me that she has been able to take ibuprofen without difficulty.  I have interpreted the D-dimer result, it and my interpretation is normal when adjusted for age, pulmonary embolism effectively ruled out.  Patient states that she had significant but not complete relief of pain with ketorolac.  I have recommended that she continue using over-the-counter ibuprofen and acetaminophen for pain, follow-up with PCP in 1 week, return precautions discussed.  Final Clinical Impression(s) / ED Diagnoses Final diagnoses:  Nonspecific chest pain  Hypokalemia    Rx / DC Orders ED Discharge Orders          Ordered    potassium chloride SA (KLOR-CON M) 20 MEQ tablet  2 times daily  06/16/22 0346              Dione Booze, MD 06/16/22 [phone]

## 2022-06-16 NOTE — ED Notes (Signed)
Patient verbalizes understanding of d/c instructions. Opportunities for questions and answers were provided. Pt d/c from ED and wheeled to lobby.  

## 2022-06-16 NOTE — Discharge Instructions (Addendum)
You may take ibuprofen and/or acetaminophen as needed for pain.  Please be aware that when you combine ibuprofen and acetaminophen, you get better pain relief and you get from taking either medication by itself.  You may apply ice to the sore areas in your chest.  Ice to be applied for 30 minutes at a time, 4 times a day.  Return if you have any new, worsening, or concerning symptoms.

## 2022-06-24 ENCOUNTER — Other Ambulatory Visit (HOSPITAL_COMMUNITY): Payer: Self-pay

## 2022-06-29 ENCOUNTER — Other Ambulatory Visit: Payer: Self-pay

## 2022-06-29 ENCOUNTER — Emergency Department (HOSPITAL_COMMUNITY): Payer: Medicare Other

## 2022-06-29 ENCOUNTER — Emergency Department (HOSPITAL_COMMUNITY)
Admission: EM | Admit: 2022-06-29 | Discharge: 2022-06-30 | Disposition: A | Discharge status: Home / Self Care | Payer: Medicare Other | Attending: Emergency Medicine | Admitting: Emergency Medicine

## 2022-06-29 ENCOUNTER — Ambulatory Visit (INDEPENDENT_AMBULATORY_CARE_PROVIDER_SITE_OTHER)
Admission: EM | Admit: 2022-06-29 | Discharge: 2022-06-29 | Disposition: A | Discharge status: Home / Self Care | Payer: Medicare Other | Source: Home / Self Care

## 2022-06-29 ENCOUNTER — Other Ambulatory Visit (HOSPITAL_COMMUNITY): Payer: Self-pay

## 2022-06-29 DIAGNOSIS — Z5900 Homelessness unspecified: Secondary | ICD-10-CM | POA: Insufficient documentation

## 2022-06-29 DIAGNOSIS — R079 Chest pain, unspecified: Secondary | ICD-10-CM | POA: Diagnosis not present

## 2022-06-29 DIAGNOSIS — F200 Paranoid schizophrenia: Secondary | ICD-10-CM | POA: Insufficient documentation

## 2022-06-29 DIAGNOSIS — R002 Palpitations: Secondary | ICD-10-CM | POA: Diagnosis not present

## 2022-06-29 DIAGNOSIS — R0602 Shortness of breath: Secondary | ICD-10-CM | POA: Insufficient documentation

## 2022-06-29 DIAGNOSIS — Z79899 Other long term (current) drug therapy: Secondary | ICD-10-CM | POA: Insufficient documentation

## 2022-06-29 DIAGNOSIS — Z733 Stress, not elsewhere classified: Secondary | ICD-10-CM | POA: Insufficient documentation

## 2022-06-29 DIAGNOSIS — E119 Type 2 diabetes mellitus without complications: Secondary | ICD-10-CM | POA: Diagnosis not present

## 2022-06-29 DIAGNOSIS — R42 Dizziness and giddiness: Secondary | ICD-10-CM | POA: Insufficient documentation

## 2022-06-29 DIAGNOSIS — I10 Essential (primary) hypertension: Secondary | ICD-10-CM | POA: Diagnosis not present

## 2022-06-29 DIAGNOSIS — R519 Headache, unspecified: Secondary | ICD-10-CM | POA: Diagnosis not present

## 2022-06-29 DIAGNOSIS — R072 Precordial pain: Secondary | ICD-10-CM

## 2022-06-29 DIAGNOSIS — F17200 Nicotine dependence, unspecified, uncomplicated: Secondary | ICD-10-CM | POA: Insufficient documentation

## 2022-06-29 DIAGNOSIS — E876 Hypokalemia: Secondary | ICD-10-CM | POA: Insufficient documentation

## 2022-06-29 DIAGNOSIS — R0789 Other chest pain: Secondary | ICD-10-CM | POA: Diagnosis not present

## 2022-06-29 LAB — CBC
HCT: 41.4 % (ref 36.0–46.0)
Hemoglobin: 13.3 g/dL (ref 12.0–15.0)
MCH: 27.5 pg (ref 26.0–34.0)
MCHC: 32.1 g/dL (ref 30.0–36.0)
MCV: 85.5 fL (ref 80.0–100.0)
Platelets: 356 10*3/uL (ref 150–400)
RBC: 4.84 MIL/uL (ref 3.87–5.11)
RDW: 13.9 % (ref 11.5–15.5)
WBC: 6 10*3/uL (ref 4.0–10.5)
nRBC: 0 % (ref 0.0–0.2)

## 2022-06-29 LAB — BASIC METABOLIC PANEL
Anion gap: 9 (ref 5–15)
BUN: 16 mg/dL (ref 8–23)
CO2: 23 mmol/L (ref 22–32)
Calcium: 9.9 mg/dL (ref 8.9–10.3)
Chloride: 105 mmol/L (ref 98–111)
Creatinine, Ser: 0.92 mg/dL (ref 0.44–1.00)
GFR, Estimated: >60 mL/min (ref >60)
Glucose, Bld: 100 mg/dL — ABNORMAL HIGH (ref 70–99)
Potassium: 3.2 mmol/L — ABNORMAL LOW (ref 3.5–5.1)
Sodium: 137 mmol/L (ref 135–145)

## 2022-06-29 LAB — TROPONIN I (HIGH SENSITIVITY): Troponin I (High Sensitivity): 11 ng/L (ref <18)

## 2022-06-29 MED ORDER — OLANZAPINE 10 MG PO TABS
10.0000 mg | ORAL_TABLET | Freq: Every day | ORAL | 0 refills | Status: DC
  Filled 2022-06-29: qty 30, 30d supply, fill #0

## 2022-06-29 NOTE — ED Triage Notes (Signed)
Pt c/o intermittent centralized chest pain that started at rest approx 1 hour ago while sitting at the bus stop. CP associated with dizziness and SOB.

## 2022-06-29 NOTE — ED Provider Notes (Signed)
Behavioral Health Urgent Care Medical Screening Exam  Patient Name: Shari [name] MRN: [id number] Date of Evaluation: 06/29/22 Chief Complaint:   Diagnosis:  Final diagnoses:  Paranoid schizophrenia (HCC)  Homelessness    History of Present illness: Shari [name] is a 66 y.o. female. Patient presents voluntarily to [facility] behavioral health for walk-in assessment.   Patient is assessed, face-to-face, by nurse practitioner, seated in assessment area, no acute distress. She  is alert and oriented, pleasant and cooperative during assessment.   Anallely requests assistance with establishing outpatient psychiatry follow-up and restarting olanzapine. She stopped her olanzapine several weeks ago after she ran out of medication. She endorses chronic auditory hallucinations. She states "I hear voice talk" I hear my family members talking about me, I always hear it, I have for years." Denies command hallucinations, denies increasing or intrusive hallucinations.    [name] unable to have medication shipped from pharmacy as she does not have permanent address currently. Would prefer to pick up medications at local pharmacy. [name] also seeking housing resources, currently residing in hotels, shelters when unable to financially afford hotel stay. Plans to reside at shelter until 07/07/2022.  She has been diagnosed with schizoaffective disorder, bipolar type and paranoid schizophrenia.  [name] not linked with outpatient psychiatry currently.  She endorses history of multiple previous inpatient psychiatric hospitalizations.  No family mental health history reported.  Patient  presents with euthymic mood, congruent affect.  She denies suicidal and homicidal ideations. Denies history of suicide attempts, denies history of self-harm.  Patient easily  contracts verbally for safety with this Clinical research associate.    Patient has normal speech and behavior.  She  denies visual hallucinations.  Patient is able to converse  coherently with goal-directed thoughts and no distractibility or preoccupation.  Denies symptoms of paranoia.  Objectively there is no evidence of psychosis/mania or delusional thinking.  Shari [name] is currently homeless "off an on" for several years. She plans to seek shelter and follow up with housing resources provided. She denies alcohol and substance use. Patient endorses average sleep and appetite.  Patient offered support and encouragement.  She gives verbal consent for this writer to speak with her sister, Elnoria Howard phone number [phone].  Attempted to reach patient's sister x2, HIPAA compliant voicemail left.   Patient and family are educated and verbalize understanding of mental health resources and other crisis services in the community. They are instructed to call 911 and present to the nearest emergency room should patient experience any suicidal/homicidal ideation, auditory/visual/hallucinations, or detrimental worsening of mental health condition.    Psychiatric Specialty Exam  Presentation  General Appearance:Appropriate for Environment; Neat; Well Groomed  Eye Contact:Good  Speech:Clear and Coherent; Normal Rate  Speech Volume:Normal  Handedness:Right   Mood and Affect  Mood:Euthymic  Affect:Appropriate; Congruent   Thought Process  Thought Processes:Coherent; Goal Directed; Linear  Descriptions of Associations:Intact  Orientation:Full (Time, Place and Person)  Thought Content:Logical; WDL  Diagnosis of Schizophrenia or Schizoaffective disorder in past: Yes  Duration of Psychotic Symptoms: Greater than six months  Hallucinations:Auditory "I always hear "voice talk." I have heard my family members voices for years."  Ideas of Reference:None  Suicidal Thoughts:No  Homicidal Thoughts:No   Sensorium  Memory:Immediate Good; Recent Good  Judgment:Fair  Insight:Fair   Executive Functions  Concentration:Good  Attention  Span:Good  Recall:Good  Fund of Knowledge:Good  Language:Good   Psychomotor Activity  Psychomotor Activity:Normal   Assets  Assets:Communication Skills; Desire for Improvement; Financial Resources/Insurance; Intimacy; Leisure  Time; Physical Health; Resilience; Social Support   Sleep  Sleep:Good  Number of hours: 7   No data recorded  Physical Exam: Physical Exam Vitals and nursing note reviewed.  Constitutional:      Appearance: Normal appearance. [name] well-developed and normal weight.  HENT:     Head: Normocephalic and atraumatic.     Nose: Nose normal.  Cardiovascular:     Rate and Rhythm: Normal rate.  Pulmonary:     Effort: Pulmonary effort is normal.  Musculoskeletal:        General: Normal range of motion.     Cervical back: Normal range of motion.  Skin:    General: Skin is warm and dry.  Neurological:     Mental Status: [name] alert and oriented to person, place, and time.  Psychiatric:        Attention and Perception: Attention normal. She perceives auditory hallucinations.        Mood and Affect: Mood and affect normal.        Speech: Speech normal.        Behavior: Behavior normal. Behavior is cooperative.        Thought Content: Thought content normal.        Cognition and Memory: Cognition and memory normal.    Review of Systems  Constitutional: Negative.   HENT: Negative.    Eyes: Negative.   Respiratory: Negative.    Cardiovascular: Negative.   Gastrointestinal: Negative.   Genitourinary: Negative.   Musculoskeletal: Negative.   Skin: Negative.   Neurological: Negative.   Psychiatric/Behavioral:  Positive for hallucinations.    Blood pressure 131/77, pulse (!) 103, temperature 98.6 F (37 C), temperature source Oral, resp. rate 18, SpO2 100 %. There is no height or weight on file to calculate BMI.  Musculoskeletal: Strength & Muscle Tone: within normal limits Gait & Station: normal Patient leans: N/A   [facility] MSE Discharge  Disposition for Follow up and Recommendations: Patient reviewed with Dr Nelly Rout.  Based on my evaluation the patient does not appear to have an emergency medical condition and can be discharged with resources and follow up care in outpatient services for Medication Management and Individual Therapy Follow up with outpatient psychiatry, resources provided.  Medications: -olanzapine 5mg  daily/mood  Follow up with housing resources provided.  Follow up with primary care provider.    , FNP 06/29/2022, 12:52 PM

## 2022-06-29 NOTE — ED Notes (Signed)
Pt given a cab voucher to get medication from [facility] pharmacy and then to transport  to [address].  Pt given AVS and follow up instructions and resources.  Pt was escorted to lobby and left with CAB 34.  No distress noted and pt verbalized understanding of instructions.

## 2022-06-29 NOTE — BH Assessment (Signed)
LCSW Progress Note   Per Doran Heater, NP, this pt does not require psychiatric hospitalization at this time.  Pt is psychiatrically cleared.  Discharge instructions include several resources for outpatient therapy and psychiatric services, shelters, hot meals, community resources for the geriatric population including [facility].  EDP Doran Heater, NP, has been notified.  Hansel Starling, MSW, LCSW [facility] [phone] or [phone]

## 2022-06-29 NOTE — Discharge Instructions (Addendum)
Patient is instructed prior to discharge to:  Take all medications as prescribed by his/her mental healthcare provider. Report any adverse effects and or reactions from the medicines to his/her outpatient provider promptly. Keep all scheduled appointments, to ensure that you are getting refills on time and to avoid any interruption in your medication.  If you are unable to keep an appointment call to reschedule.  Be sure to follow-up with resources and follow-up appointments provided.  Patient has been instructed & cautioned: To not engage in alcohol and or illegal drug use while on prescription medicines. In the event of worsening symptoms, patient is instructed to call the crisis hotline, 911 and or go to the nearest ED for appropriate evaluation and treatment of symptoms. To follow-up with his/her primary care provider for your other medical issues, concerns and or health care needs.   [name] [address] [place], [place], [address] [phone] phone [phone] fax  [name] is a Program of All-inclusive Care for the Elderly ([facility]) that provides community-based services to enrolled individuals who need medical care and support to continue living at home.   "[facility] provides everything I need in one place. Since I have been with [facility], I have been able to work with the physical therapists and doctors. I have also reached certain health goals such as getting off of most of the medications I was taking."  ~ Shari [name], a [facility] participant  Here are some of the services PACE may cover:  Adult day primary care (including doctor and recreational therapy nursing services) Dentistry Emergency services Home care Hospital care Laboratory/x-ray services Meals Nursing home care Nutritional counseling Occupational therapy Physical therapy Preventive care Social work counseling Transportation to the [facility] center for activities or medical appointments You'll get your Part-D  covered drugs and all other necessary medication from the [facility]ACE program. If you join a separate Medicare drug plan while you're in the PACE program, you'll be disenrolled from [facility].   Base on the information you have provided and the presenting issue, outpatient services with therapy and psychiatry have been recommended.  It is imperative that you follow through with treatment recommendations within 5-7 days from the of discharge to mitigate further risk to your safety and mental well-being. A list of referrals has been provided below to get you started.  You are not limited to the list provided.  In case of an urgent crisis, you may contact the Mobile Crisis Unit with [facility] at [phone].  Outpatient Therapy and Psychiatry for Medicare Recipients  [facility] [address] [place], [place], [address] [phone] phone  [facility] [address], [address] [place], [place], [address] [phone] phone  [facility] [address], [address] [place], [place], [address] [phone] phone  [facility] [address] [place], [place], [address] [phone] phone [phone] fax  [facility] [place] [address], [place], [address] [phone] phone  [facility] [address] [place], [place], [address] [phone] phone [phone] fax  [facility] [address] [place], [place], [address] [phone] phone  [facility] [address] [place], [place], [address] [phone] phone  [facility] [address] [place], [place], [address] [phone] phone [phone] fax    FOOD PANTRIES  [facility] [address] [place], [place], [place] [address] [phone] ext. [phone] Visit or call between 8:30am-5pm  Eligibility: Valid photo ID & Social Visual merchandiser.[email or web address] [address] [place], [place] [address] [phone] Wed. & Sat. 2-6pm; appointments preferred Eligibility: Valid Photo ID with accurate address  [facility] [address] [place] [address], [place], [place] [address] (Appointments required for food)  [phone] Tues. 11pm-4pm; Sat. 12-2pm For utility assistance: MWF [phone] [email or web address].[facility] [employer] [address], [place], [place] [address] [phone] [email or web address] Thurs. 2-4pm Picture ID, proof of residency in [facility]  [address] [place], [place] [address] [phone] Mon.-Fri. 1:00-4:30pm All assistance is first come, first served and  requires documentation. Call for info. [email or web address]  [employer] [facility] [place] [address], [place], [place] [address] [phone] Tuesday and Thursday 9am - 12pm Eligibility: No referral needed [email or web address]  SHELTERS  [facility] [address] [place], [place] [address] [phone] Population served: Adult men & women (59 years old and older, able to perform activities for daily living)  [facility] [address] [place], [place]  [address] [phone] Population served: Single women 18+ without dependents Documents required:  Valid ID & Social Security [employer] broker of [employer] [address], [place], [place] [address] [phone] Population Served: Families with children, adult women, and adult men.   HOT MEALS  Please call the churches/agencies/organizations listed below to learn more about when and where hot meals are provided.   [facility] [address], [place], [place] [address]  [facility] [address]  [place], [place], [place] [address]  [facility] ([address]) [address] [place], [place] [address] Dinner: Tuesdays & Thursdays 6:00PM - 6:30PM  [facility] [address], [place], [place]  [address] [phone]  [facility] [address] [place], [place] [address] [phone]  [facility]  [address] [place], [place] [address] [phone] Documents required: Valid ID & Social Security Card

## 2022-06-29 NOTE — ED Provider Triage Note (Signed)
Emergency Medicine Provider Triage Evaluation Note  Shari [name] , a 66 y.o. female  was evaluated in triage.  Pt complains of chest pain, shortness of breath.  States that same began about 2 hours ago when she was sitting at a bus stop.  Pain is in her left breast and does not radiate.  It is not reproducible to palpation or pleuritic in nature.  States she has a history of A-fib, she is not anticoagulated.  No other history of heart problems.  Denies any leg pain or leg swelling.  She does smoke.  Review of Systems  Positive:  Negative:   Physical Exam  BP 124/83   Pulse 89   Temp 98.3 F (36.8 C) (Oral)   Resp 19   SpO2 100%  Gen:   Awake, no distress  Resp:  Normal effort  MSK:   Moves extremities without difficulty  Other:    Medical Decision Making  Medically screening exam initiated at 8:14 PM.  Appropriate orders placed.  BARBAR BREDE was informed that the remainder of the evaluation will be completed by another provider, this initial triage assessment does not replace that evaluation, and the importance of remaining in the ED until their evaluation is complete.     Silva Bandy, PA-C 06/29/22 2015

## 2022-06-29 NOTE — BH Assessment (Signed)
[name], Routine; 66 year old presents voluntarily to [facility] via Behavioral  Health Response Team.  Pt denies SI, or AVH/or Drug and alcohol.   Pt reports hopelessness, and anxious. Pt reports vague homicidal ideations against her sister, "I think about hurting my sister, my family, they put me out in the streets, I am homeless".  Pt admits to prior MH diagnosis or prescribed medication for symptom management.  MSE signed by patient.

## 2022-06-30 ENCOUNTER — Emergency Department (HOSPITAL_COMMUNITY)
Admission: EM | Admit: 2022-06-30 | Discharge: 2022-07-01 | Disposition: A | Discharge status: Home / Self Care | Payer: Medicare Other | Source: Home / Self Care | Attending: Emergency Medicine | Admitting: Emergency Medicine

## 2022-06-30 ENCOUNTER — Emergency Department (HOSPITAL_COMMUNITY): Payer: Medicare Other

## 2022-06-30 DIAGNOSIS — Z79899 Other long term (current) drug therapy: Secondary | ICD-10-CM | POA: Insufficient documentation

## 2022-06-30 DIAGNOSIS — R6889 Other general symptoms and signs: Secondary | ICD-10-CM | POA: Diagnosis not present

## 2022-06-30 DIAGNOSIS — R079 Chest pain, unspecified: Secondary | ICD-10-CM | POA: Insufficient documentation

## 2022-06-30 DIAGNOSIS — I10 Essential (primary) hypertension: Secondary | ICD-10-CM | POA: Insufficient documentation

## 2022-06-30 DIAGNOSIS — R0789 Other chest pain: Secondary | ICD-10-CM | POA: Diagnosis not present

## 2022-06-30 DIAGNOSIS — R002 Palpitations: Secondary | ICD-10-CM | POA: Insufficient documentation

## 2022-06-30 DIAGNOSIS — R42 Dizziness and giddiness: Secondary | ICD-10-CM | POA: Diagnosis not present

## 2022-06-30 DIAGNOSIS — Z743 Need for continuous supervision: Secondary | ICD-10-CM | POA: Diagnosis not present

## 2022-06-30 DIAGNOSIS — Z733 Stress, not elsewhere classified: Secondary | ICD-10-CM | POA: Insufficient documentation

## 2022-06-30 DIAGNOSIS — F17200 Nicotine dependence, unspecified, uncomplicated: Secondary | ICD-10-CM | POA: Insufficient documentation

## 2022-06-30 DIAGNOSIS — E119 Type 2 diabetes mellitus without complications: Secondary | ICD-10-CM | POA: Insufficient documentation

## 2022-06-30 DIAGNOSIS — R0602 Shortness of breath: Secondary | ICD-10-CM | POA: Insufficient documentation

## 2022-06-30 LAB — TROPONIN I (HIGH SENSITIVITY): Troponin I (High Sensitivity): 6 ng/L (ref <18)

## 2022-06-30 MED ORDER — POTASSIUM CHLORIDE CRYS ER 20 MEQ PO TBCR
40.0000 meq | EXTENDED_RELEASE_TABLET | Freq: Once | ORAL | Status: AC
  Administered 2022-06-30: 40 meq via ORAL
  Filled 2022-06-30: qty 2

## 2022-06-30 MED ORDER — IOHEXOL 350 MG/ML SOLN
57.0000 mL | Freq: Once | INTRAVENOUS | Status: AC | PRN
Start: 2022-06-30 — End: 2022-06-30
  Administered 2022-06-30: 57 mL via INTRAVENOUS

## 2022-06-30 NOTE — ED Provider Notes (Signed)
I provided a substantive portion of the care of this patient.  I personally performed the entirety of the medical decision making for this encounter.  Is a 66 year old female with history of hypertension, diabetes, hyperlipidemia comes in with episode of chest pain which came on at rest, now resolved.  Patient states that she has pains fairly frequently.  ECG is unchanged from prior, troponin is normal x2.  EKG Interpretation  Date/Time:  Wednesday June 29 2022 19:21:47 EDT Ventricular Rate:  100 PR Interval:  124 QRS Duration: 78 QT Interval:  340 QTC Calculation: 438 R Axis:   74 Text Interpretation: Sinus rhythm with Premature atrial complexes T wave abnormality, consider inferior ischemia T wave abnormality, consider anterolateral ischemia Abnormal ECG When compared with ECG of 15-Jun-2022 13:46, PREVIOUS ECG IS PRESENT similar to prior no stemi Confirmed by Tanda Rockers ([id number]) on 06/29/2022 7:26:07 PM    Dione Booze, MD 06/30/22 [phone]

## 2022-06-30 NOTE — Discharge Instructions (Signed)
You came to the emergency department today to be evaluated for your chest pain.  Your work-up was reassuring that you are not having a heart attack today.  The CT scan of your chest did not show any blood clots in your lungs.  The CT scan of your chest did find a nodule within your thyroid that you will need to have repeat imaging of.  It also showed groundglass opacities to your left lower lung lobe which will need repeat CT imaging in 3 months.  Please follow-up with your primary care doctor or the [facility] to help schedule these.  Get help right away if: Your chest pain gets worse. You have a cough that gets worse, or you cough up blood. You have severe pain in your abdomen. You faint. You have sudden, unexplained chest discomfort. You have sudden, unexplained discomfort in your arms, back, neck, or jaw. You have shortness of breath at any time. You suddenly start to sweat, or your skin gets clammy. You feel nausea or you vomit. You suddenly feel lightheaded or dizzy. You have severe weakness, or unexplained weakness or fatigue. Your heart begins to beat quickly, or it feels like it is skipping beats.

## 2022-06-30 NOTE — ED Provider Notes (Signed)
[facility] EMERGENCY DEPARTMENT Provider Note   CSN: [id number] Arrival date & time: 06/29/22  1859     History  Chief Complaint  Patient presents with   Chest Pain    Shari [name] is a 66 y.o. female with a history of hypertension, hyperlipidemia, type 2 diabetes mellitus, atrial fibrillation (not on anticoagulation), schizophrenia.  Presents to the emergency department with a chief plaint of chest pain.  Patient reports that chest pain started approximately 30 to 40 minutes prior to arrival in the emergency department.  Patient reports that pain started while sitting at rest.  Pain is located to the left side of her chest and did not radiate.  Patient states "it is hard to describe the pain."  Patient reports that pain has resolved.  Patient is unsure how long the pain lasted for.  Patient reports that she did have associated shortness of breath, headache, and lightheadedness with her symptoms.  The symptoms to have resolved.  Patient denies any leg swelling or tenderness, palpitations, hemoptysis, nausea, vomiting, syncope.   Chest Pain Associated symptoms: no abdominal pain, no back pain, no dizziness, no fever, no headache, no nausea, no numbness, no palpitations, no shortness of breath, no vomiting and no weakness        Home Medications Prior to Admission medications   Medication Sig Start Date End Date Taking? Authorizing Provider  atenolol (TENORMIN) 25 MG tablet Take 0.5 tablets (12.5 mg total) by mouth daily. 01/19/22   Parks Ranger, DO  atorvastatin (LIPITOR) 40 MG tablet Take 1 tablet (40 mg total) by mouth daily. Patient taking differently: Take 40 mg by mouth at bedtime. 01/18/22 02/17/22  Parks Ranger, DO  divalproex (DEPAKOTE) 500 MG DR tablet Take 1 tablet (500 mg total) by mouth at bedtime. 01/18/22 04/18/22  Parks Ranger, DO  ibuprofen (ADVIL) 800 MG tablet Take 800 mg by mouth every 8 (eight) hours as needed for  headache or moderate pain.    [provider]  lisinopril (ZESTRIL) 10 MG tablet Take 1 tablet (10 mg total) by mouth daily. Patient taking differently: Take 10 mg by mouth every evening. 01/18/22 02/17/22  Parks Ranger, DO  methimazole (TAPAZOLE) 10 MG tablet Take 1 tablet (10 mg total) by mouth daily. 01/18/22 02/17/22  Parks Ranger, DO  OLANZapine (ZYPREXA) 10 MG tablet Take 1 tablet (10 mg total) by mouth at bedtime. 06/29/22   Lucky Rathke, FNP  pantoprazole (PROTONIX) 40 MG tablet Take 1 tablet (40 mg total) by mouth 2 (two) times daily before a meal. 01/18/22 02/17/22  Parks Ranger, DO  potassium chloride SA (KLOR-CON M) 20 MEQ tablet Take 1 tablet (20 mEq total) by mouth 2 (two) times daily. 0000000   Delora Fuel, MD  traZODone (DESYREL) 50 MG tablet Take 1 tablet (50 mg total) by mouth at bedtime. Patient taking differently: Take 50 mg by mouth at bedtime as needed for sleep. 01/18/22 04/18/22  Parks Ranger, DO      Allergies    Aspirin, Chocolate, Cocoa, Penicillins, and Sulfa antibiotics    Review of Systems   Review of Systems  Constitutional:  Negative for chills and fever.  Eyes:  Negative for visual disturbance.  Respiratory:  Negative for shortness of breath.   Cardiovascular:  Positive for chest pain. Negative for palpitations and leg swelling.  Gastrointestinal:  Negative for abdominal pain, nausea and vomiting.  Genitourinary:  Negative for difficulty urinating and dysuria.  Musculoskeletal:  Negative for back pain and neck pain.  Skin:  Negative for color change and rash.  Neurological:  Positive for light-headedness. Negative for dizziness, tremors, seizures, syncope, facial asymmetry, speech difficulty, weakness, numbness and headaches.  Psychiatric/Behavioral:  Negative for confusion.     Physical Exam Updated Vital Signs BP 114/72   Pulse 67   Temp 97.9 F (36.6 C) (Oral)   Resp 16   SpO2 100%  Physical  Exam Vitals and nursing note reviewed.  Constitutional:      General: [name] not in acute distress.    Appearance: [name] not ill-appearing, toxic-appearing or diaphoretic.  HENT:     Head: Normocephalic.  Eyes:     General: No scleral icterus.       Right eye: No discharge.        Left eye: No discharge.  Cardiovascular:     Rate and Rhythm: Normal rate.     Pulses:          Radial pulses are 2+ on the right side and 2+ on the left side.     Heart sounds: Normal heart sounds, S1 normal and S2 normal. Heart sounds not distant. No murmur heard. Pulmonary:     Effort: Pulmonary effort is normal. No tachypnea, bradypnea or respiratory distress.     Breath sounds: Normal breath sounds. No stridor.  Musculoskeletal:     Right lower leg: No swelling, tenderness or bony tenderness. No edema.     Left lower leg: No swelling, tenderness or bony tenderness. No edema.  Skin:    General: Skin is warm and dry.  Neurological:     General: No focal deficit present.     Mental Status: [name] alert.  Psychiatric:        Behavior: Behavior is cooperative.     ED Results / Procedures / Treatments   Labs (all labs ordered are listed, but only abnormal results are displayed) Labs Reviewed  BASIC METABOLIC PANEL - Abnormal; Notable for the following components:      Result Value   Potassium 3.2 (*)    Glucose, Bld 100 (*)    All other components within normal limits  CBC  TROPONIN I (HIGH SENSITIVITY)  TROPONIN I (HIGH SENSITIVITY)    EKG EKG Interpretation  Date/Time:  Wednesday June 29 2022 19:21:47 EDT Ventricular Rate:  100 PR Interval:  124 QRS Duration: 78 QT Interval:  340 QTC Calculation: 438 R Axis:   74 Text Interpretation: Sinus rhythm with Premature atrial complexes T wave abnormality, consider inferior ischemia T wave abnormality, consider anterolateral ischemia Abnormal ECG When compared with ECG of 15-Jun-2022 13:46, PREVIOUS ECG IS PRESENT similar to prior no stemi  Confirmed by Tanda Rockers ([id number]) on 06/29/2022 7:26:07 PM  Radiology CT Angio Chest PE W and/or Wo Contrast  Result Date: 06/30/2022 CLINICAL DATA:  Intermittent centralized chest pain with dizziness and shortness of breath. EXAM: CT ANGIOGRAPHY CHEST WITH CONTRAST TECHNIQUE: Multidetector CT imaging of the chest was performed using the standard protocol during bolus administration of intravenous contrast. Multiplanar CT image reconstructions and MIPs were obtained to evaluate the vascular anatomy. RADIATION DOSE REDUCTION: This exam was performed according to the departmental dose-optimization program which includes automated exposure control, adjustment of the mA and/or kV according to patient size and/or use of iterative reconstruction technique. CONTRAST:  29mL OMNIPAQUE IOHEXOL 350 MG/ML SOLN COMPARISON:  PA and lateral chest today, chest x-ray 06/15/2022, CT abdomen and pelvis without contrast 03/16/2018, and CTA chest 09/02/2008.  FINDINGS: Cardiovascular: The pulmonary arteries are normal in caliber without evidence of thromboemboli. The cardiac size is normal. There is no pericardial effusion, no visible coronary artery calcification. There is aortic atherosclerosis without aneurysm or dissection. The great vessels are unremarkable. The pulmonary veins are decompressed. Mediastinum/Nodes: Heterogeneous 2.1 x 2.1 cm mass has developed in the superior pole right lobe of the thyroid gland warranting ultrasound follow-up. Left lobe and the isthmus are unremarkable. The trachea is clear. There is no esophageal thickening. There is no intrathoracic or axillary adenopathy. Lungs/Pleura: There are mild centrilobular and paraseptal emphysematous changes in the lung apices. There is mild posterior atelectasis in the lower lobes. No lung nodules are seen. The central airways are clear. There are infrahilar ground-glass opacities in the left lower lobe which could be additional atelectasis or could be due to  pneumonitis. Short interval follow-up study recommended to ensure clearing. Remaining lungs are clear. There is no pleural effusion, thickening or pneumothorax. Upper Abdomen: No acute abnormality. Old cholecystectomy. Mild hepatic steatosis. Musculoskeletal: No chest wall abnormality. No acute or significant osseous findings. Review of the MIP images confirms the above findings. IMPRESSION: 1. No evidence of arterial dilatation or embolus. 2. Left lower lobe infrahilar ground-glass opacities which could be atelectasis or pneumonitis. Three-month follow-up study recommended to ensure clearing. 3. 2.1 cm right thyroid nodule not seen in 2009. Ultrasound follow-up recommended. Reference: J Am Coll Radiol. 2015 Feb;12(2): 143-50. 4. Aortic atherosclerosis. 5. Mild hepatic steatosis. Electronically Signed   By: Telford Nab M.D.   On: 06/30/2022 02:42   DG Chest 2 View  Result Date: 06/29/2022 CLINICAL DATA:  chest pain.  Dizziness EXAM: CHEST - 2 VIEW COMPARISON:  Chest x-ray 06/15/2022, CT chest 09/02/2008 FINDINGS: The heart and mediastinal contours are within normal limits. No focal consolidation. No pulmonary edema. No pleural effusion. No pneumothorax. No acute osseous abnormality.  Right upper quadrant surgical clips. IMPRESSION: No active cardiopulmonary disease. Electronically Signed   By: Iven Finn M.D.   On: 06/29/2022 20:35    Procedures Procedures    Medications Ordered in ED Medications  potassium chloride SA (KLOR-CON M) CR tablet 40 mEq (has no administration in time range)  iohexol (OMNIPAQUE) 350 MG/ML injection 57 mL (57 mLs Intravenous Contrast Given 06/30/22 0215)    ED Course/ Medical Decision Making/ A&P                           Medical Decision Making Amount and/or Complexity of Data Reviewed Radiology: ordered.  Risk Prescription drug management.   Alert 66 year old female no acute distress, nontoxic-appearing.  Presents to ED with a chief complaint of chest  pain.  Information was obtained from patient.  I reviewed patient's past medical records including previous provider notes, labs, and imaging.  Patient has medical history as outlined in HPI was complicates her care.  Per chart review patient has been seen multiple times in the past for chest pain.  Most recently patient was seen on 06/15/2022 for nonspecific chest pain with reassuring work-up.  Patient was seen yesterday at behavioral urgent care for hallucinations related to her paranoid schizophrenia.  Patient was discharged with outpatient resources.  Due to patient's chest pain ACS work-up was initiated.  With history of A-fib not on anticoagulation and chest pain in the setting of lightheadedness will obtain CTA of chest to evaluate for possible PE.  I personally viewed interpret patient's lab results.  Pertinent findings include: -Potassium 3.2 -Troponin  11 and 6 with delta of -5  I personally viewed and interpreted patient's chest x-ray.  Imaging shows no active cardiopulmonary disease.  I personally viewed and interpreted patient's EKG.  Tracing shows sinus rhythm with LVH.  No significant change from previous tracing.  I personally viewed and interpreted patient CT imaging.  Agree with radiology interpretation of no evidence of arterial dilation or embolus.  2.1 cm right thyroid nodule.  Left lower lobe infrahilar groundglass opacities which could be atelectasis or pneumonitis.  Patient is hemodynamically stable in no acute distress.  Will discharge to follow-up with PCP in the outpatient setting.  Patient will need to follow-up with PCP about thyroid nodule and repeat CT imaging.  Patient was given copy of her CT scan results.  Patient was given information follow-up with  and wellness clinic as well.  Based on patient's chief complaint, I considered admission might be necessary, however after reassuring ED workup feel patient is reasonable for discharge.  Discussed results,  findings, treatment and follow up. Patient advised of return precautions. Patient verbalized understanding and agreed with plan.  Portions of this note were generated with Scientist, clinical (histocompatibility and immunogenetics). Dictation errors may occur despite best attempts at proofreading.         Final Clinical Impression(s) / ED Diagnoses Final diagnoses:  None    Rx / DC Orders ED Discharge Orders     None         Berneice Heinrich 06/30/22 0327    Shon Baton, MD 06/30/22 [phone]

## 2022-06-30 NOTE — ED Triage Notes (Signed)
Pt c/o "stress & heart fluttering." Pt advises she's "been under some stress" recently, unable to provide more information. Pt states she also thinks "something is going on w my head."  120/58 HR

## 2022-07-01 ENCOUNTER — Emergency Department (HOSPITAL_COMMUNITY): Payer: Medicare Other

## 2022-07-01 DIAGNOSIS — R079 Chest pain, unspecified: Secondary | ICD-10-CM | POA: Diagnosis not present

## 2022-07-01 LAB — BASIC METABOLIC PANEL
Anion gap: 11 (ref 5–15)
BUN: 10 mg/dL (ref 8–23)
CO2: 19 mmol/L — ABNORMAL LOW (ref 22–32)
Calcium: 9.9 mg/dL (ref 8.9–10.3)
Chloride: 107 mmol/L (ref 98–111)
Creatinine, Ser: 0.87 mg/dL (ref 0.44–1.00)
GFR, Estimated: >60 mL/min (ref >60)
Glucose, Bld: 93 mg/dL (ref 70–99)
Potassium: 3.2 mmol/L — ABNORMAL LOW (ref 3.5–5.1)
Sodium: 137 mmol/L (ref 135–145)

## 2022-07-01 LAB — CBC
HCT: 41.9 % (ref 36.0–46.0)
Hemoglobin: 13.7 g/dL (ref 12.0–15.0)
MCH: 27.7 pg (ref 26.0–34.0)
MCHC: 32.7 g/dL (ref 30.0–36.0)
MCV: 84.8 fL (ref 80.0–100.0)
Platelets: 339 10*3/uL (ref 150–400)
RBC: 4.94 MIL/uL (ref 3.87–5.11)
RDW: 13.9 % (ref 11.5–15.5)
WBC: 5.8 10*3/uL (ref 4.0–10.5)
nRBC: 0 % (ref 0.0–0.2)

## 2022-07-01 LAB — TROPONIN I (HIGH SENSITIVITY)
Troponin I (High Sensitivity): 4 ng/L (ref <18)
Troponin I (High Sensitivity): 4 ng/L (ref <18)

## 2022-07-01 MED ORDER — POTASSIUM CHLORIDE CRYS ER 20 MEQ PO TBCR
40.0000 meq | EXTENDED_RELEASE_TABLET | Freq: Once | ORAL | Status: AC
  Administered 2022-07-01: 40 meq via ORAL
  Filled 2022-07-01: qty 2

## 2022-07-01 NOTE — ED Provider Notes (Addendum)
[facility] EMERGENCY DEPARTMENT Provider Note   CSN: [id number] Arrival date & time: 06/30/22  2335     History  Chief Complaint  Patient presents with   Stress    Shari [name] is a 66 y.o. female.  Shari [name] is a 66 y.o. female with a history of hypertension, hyperlipidemia, diabetes, schizophrenia, who presents to the emergency department for evaluation of stress, chest pain and heart fluttering.  Patient was seen in the ED for chest pain last night.  Reports that she returns today because she started having some chest pain again.  She reports since being here she slept and upon waking up has no more chest pain or palpitations.  She does report that she has been under some stress intermittently denies any SI, HI or AVH.  Reports shortness of breath occasionally only when chest pain is present, chest pain is not pleuritic or exertional.  No lower extremity swelling or pain, no lightheadedness or syncope, no abdominal pain, nausea or vomiting.  Patient is a current smoker, denies other drug use or alcohol use.  The history is provided by the patient.       Home Medications Prior to Admission medications   Medication Sig Start Date End Date Taking? Authorizing Provider  atenolol (TENORMIN) 25 MG tablet Take 0.5 tablets (12.5 mg total) by mouth daily. 01/19/22   Sarina Ill, DO  atorvastatin (LIPITOR) 40 MG tablet Take 1 tablet (40 mg total) by mouth daily. Patient taking differently: Take 40 mg by mouth at bedtime. 01/18/22 02/17/22  Sarina Ill, DO  divalproex (DEPAKOTE) 500 MG DR tablet Take 1 tablet (500 mg total) by mouth at bedtime. 01/18/22 04/18/22  Sarina Ill, DO  ibuprofen (ADVIL) 800 MG tablet Take 800 mg by mouth every 8 (eight) hours as needed for headache or moderate pain.    [provider]  lisinopril (ZESTRIL) 10 MG tablet Take 1 tablet (10 mg total) by mouth daily. Patient taking differently: Take 10 mg  by mouth every evening. 01/18/22 02/17/22  Sarina Ill, DO  methimazole (TAPAZOLE) 10 MG tablet Take 1 tablet (10 mg total) by mouth daily. 01/18/22 02/17/22  Sarina Ill, DO  OLANZapine (ZYPREXA) 10 MG tablet Take 1 tablet (10 mg total) by mouth at bedtime. 06/29/22   Lenard Lance, FNP  pantoprazole (PROTONIX) 40 MG tablet Take 1 tablet (40 mg total) by mouth 2 (two) times daily before a meal. 01/18/22 02/17/22  Sarina Ill, DO  potassium chloride SA (KLOR-CON M) 20 MEQ tablet Take 1 tablet (20 mEq total) by mouth 2 (two) times daily. 06/16/22   Dione Booze, MD  traZODone (DESYREL) 50 MG tablet Take 1 tablet (50 mg total) by mouth at bedtime. Patient taking differently: Take 50 mg by mouth at bedtime as needed for sleep. 01/18/22 04/18/22  Sarina Ill, DO      Allergies    Aspirin, Chocolate, Cocoa, Penicillins, and Sulfa antibiotics    Review of Systems   Review of Systems  Cardiovascular:  Positive for chest pain.    Physical Exam Updated Vital Signs BP 112/64 (BP Location: Right Arm)   Pulse 70   Temp 98.2 F (36.8 C) (Oral)   Resp 15   SpO2 100%  Physical Exam Vitals and nursing note reviewed.  Constitutional:      General: [name] not in acute distress.    Appearance: Normal appearance. [name] well-developed. [name] not diaphoretic.  HENT:     Head: Normocephalic and atraumatic.  Eyes:     General:        Right eye: No discharge.        Left eye: No discharge.     Pupils: Pupils are equal, round, and reactive to light.  Cardiovascular:     Rate and Rhythm: Normal rate and regular rhythm.     Pulses: Normal pulses.     Heart sounds: Normal heart sounds.  Pulmonary:     Effort: Pulmonary effort is normal. No respiratory distress.     Breath sounds: Normal breath sounds. No wheezing or rales.     Comments: Respirations equal and unlabored, patient able to speak in full sentences, lungs clear to auscultation bilaterally  Chest:      Chest wall: No tenderness.  Abdominal:     General: Bowel sounds are normal. There is no distension.     Palpations: Abdomen is soft. There is no mass.     Tenderness: There is no abdominal tenderness. There is no guarding.     Comments: Abdomen soft, nondistended, nontender to palpation in all quadrants without guarding or peritoneal signs  Musculoskeletal:        General: No deformity.     Cervical back: Neck supple.  Skin:    General: Skin is warm and dry.     Capillary Refill: Capillary refill takes less than 2 seconds.  Neurological:     Mental Status: [name] alert and oriented to person, place, and time.     Coordination: Coordination normal.     Comments: Speech is clear, able to follow commands Moves extremities without ataxia, coordination intact  Psychiatric:        Mood and Affect: Mood normal.        Behavior: Behavior normal.     ED Results / Procedures / Treatments   Labs (all labs ordered are listed, but only abnormal results are displayed) Labs Reviewed  BASIC METABOLIC PANEL - Abnormal; Notable for the following components:      Result Value   Potassium 3.2 (*)    CO2 19 (*)    All other components within normal limits  CBC  TROPONIN I (HIGH SENSITIVITY)  TROPONIN I (HIGH SENSITIVITY)    EKG None  Radiology DG Chest 2 View  Result Date: 07/01/2022 CLINICAL DATA:  Chest pain EXAM: CHEST - 2 VIEW COMPARISON:  06/29/2022 FINDINGS: Lungs are clear.  No pleural effusion or pneumothorax. The heart is normal in size. Visualized osseous structures are within normal limits. IMPRESSION: Normal chest radiographs. Electronically Signed   By: Charline Bills M.D.   On: 07/01/2022 00:14   CT Angio Chest PE W and/or Wo Contrast  Result Date: 06/30/2022 CLINICAL DATA:  Intermittent centralized chest pain with dizziness and shortness of breath. EXAM: CT ANGIOGRAPHY CHEST WITH CONTRAST TECHNIQUE: Multidetector CT imaging of the chest was performed using the standard  protocol during bolus administration of intravenous contrast. Multiplanar CT image reconstructions and MIPs were obtained to evaluate the vascular anatomy. RADIATION DOSE REDUCTION: This exam was performed according to the departmental dose-optimization program which includes automated exposure control, adjustment of the mA and/or kV according to patient size and/or use of iterative reconstruction technique. CONTRAST:  52mL OMNIPAQUE IOHEXOL 350 MG/ML SOLN COMPARISON:  PA and lateral chest today, chest x-ray 06/15/2022, CT abdomen and pelvis without contrast 03/16/2018, and CTA chest 09/02/2008. FINDINGS: Cardiovascular: The pulmonary arteries are normal in caliber without evidence of thromboemboli. The cardiac  size is normal. There is no pericardial effusion, no visible coronary artery calcification. There is aortic atherosclerosis without aneurysm or dissection. The great vessels are unremarkable. The pulmonary veins are decompressed. Mediastinum/Nodes: Heterogeneous 2.1 x 2.1 cm mass has developed in the superior pole right lobe of the thyroid gland warranting ultrasound follow-up. Left lobe and the isthmus are unremarkable. The trachea is clear. There is no esophageal thickening. There is no intrathoracic or axillary adenopathy. Lungs/Pleura: There are mild centrilobular and paraseptal emphysematous changes in the lung apices. There is mild posterior atelectasis in the lower lobes. No lung nodules are seen. The central airways are clear. There are infrahilar ground-glass opacities in the left lower lobe which could be additional atelectasis or could be due to pneumonitis. Short interval follow-up study recommended to ensure clearing. Remaining lungs are clear. There is no pleural effusion, thickening or pneumothorax. Upper Abdomen: No acute abnormality. Old cholecystectomy. Mild hepatic steatosis. Musculoskeletal: No chest wall abnormality. No acute or significant osseous findings. Review of the MIP images  confirms the above findings. IMPRESSION: 1. No evidence of arterial dilatation or embolus. 2. Left lower lobe infrahilar ground-glass opacities which could be atelectasis or pneumonitis. Three-month follow-up study recommended to ensure clearing. 3. 2.1 cm right thyroid nodule not seen in 2009. Ultrasound follow-up recommended. Reference: J Am Coll Radiol. 2015 Feb;12(2): 143-50. 4. Aortic atherosclerosis. 5. Mild hepatic steatosis. Electronically Signed   By: Almira Bar M.D.   On: 06/30/2022 02:42   DG Chest 2 View  Result Date: 06/29/2022 CLINICAL DATA:  chest pain.  Dizziness EXAM: CHEST - 2 VIEW COMPARISON:  Chest x-ray 06/15/2022, CT chest 09/02/2008 FINDINGS: The heart and mediastinal contours are within normal limits. No focal consolidation. No pulmonary edema. No pleural effusion. No pneumothorax. No acute osseous abnormality.  Right upper quadrant surgical clips. IMPRESSION: No active cardiopulmonary disease. Electronically Signed   By: Tish Frederickson M.D.   On: 06/29/2022 20:35    Procedures Procedures    Medications Ordered in ED Medications  potassium chloride SA (KLOR-CON M) CR tablet 40 mEq (40 mEq Oral Given 07/01/22 9407)    ED Course/ Medical Decision Making/ A&P                           Medical Decision Making Amount and/or Complexity of Data Reviewed Labs: ordered. Radiology: ordered.  Risk Prescription drug management.   Patient presents to the emergency department with chest pain. Patient nontoxic appearing, in no apparent distress, vitals without significant abnormality. Fairly benign physical exam.   DDX including but not limited to: ACS, pulmonary embolism, dissection, pneumothorax, pneumonia, arrhythmia, severe anemia, MSK, GERD, anxiety, abdominal process.   Additional history obtained:  Chart & nursing note reviewed.  Patient seen in the ED multiple times recently for chest pain including last night, labs and prior evaluation reviewed  EKG: Normal  sinus rhythm with some nonspecific T wave changes, no significant changes when compared to prior EKG  Lab Tests:  I reviewed & interpreted labs including:  No leukocytosis and normal hemoglobin, mild hypokalemia of 3.2, normal renal function, troponin negative x2  Imaging Studies ordered:  I ordered and viewed and interpreted patient's chest x-ray which shows no active cardiopulmonary disease  ED Course:  I ordered medications including oral potassium replacement  RE-EVAL: Patient sleeping and upon waking up reports no further chest pain and denies any current symptoms.  EKG without obvious acute ischemia, delta troponin negative, doubt ACS. Patient is  low risk wells, PERC negative, doubt pulmonary embolism. Pain is not a tearing sensation, symmetric pulses, no widening of mediastinum on CXR, doubt dissection. Cardiac monitor reviewed, no notable arrhythmias or tachycardia   Based on patient's chief complaint, I considered admission might be necessary, however after reassuring ED workup feel patient is reasonable for discharge.   I discussed results, treatment plan, need for PCP follow-up, and return precautions with the patient. Provided opportunity for questions, patient confirmed understanding and is in agreement with plan.    Social determinants:  -Schizophrenia, frequent ED visits  Portions of this note were generated with Scientist, clinical (histocompatibility and immunogenetics). Dictation errors may occur despite best attempts at proofreading.   Final Clinical Impression(s) / ED Diagnoses Final diagnoses:  Central chest pain    Rx / DC Orders ED Discharge Orders          Ordered    Ambulatory referral to Cardiology       Comments: If you have not heard from the Cardiology office within the next 72 hours please call [phone].   07/01/22 0814              Dartha Lodge, PA-C 07/01/22 0831    Dartha Lodge, PA-C 07/01/22 1008    Jacalyn Lefevre, MD 07/01/22 1012

## 2022-07-01 NOTE — Discharge Instructions (Addendum)
You were seen in the emergency department today for chest pain. Your work-up in the emergency department has been overall reassuring. Your labs have been fairly normal and or similar to previous blood work you have had done. Your EKG and the enzyme we use to check your heart did not show an acute heart attack at this time. Your chest x-ray was normal.   We would like you to follow up closely with your primary care provider and/or the cardiologist provided in your discharge instructions within. Return to the ER immediately should you experience any new or worsening symptoms including but not limited to return of pain, worsened pain, vomiting, shortness of breath, dizziness, lightheadedness, passing out, or any other concerns that you may have.

## 2022-07-01 NOTE — ED Notes (Signed)
Discharge instructions reviewed with patient. Patient denies any questions or concerns. Pt ambulatory from ED.

## 2022-07-02 ENCOUNTER — Other Ambulatory Visit: Payer: Self-pay

## 2022-07-02 ENCOUNTER — Emergency Department (HOSPITAL_COMMUNITY)
Admission: EM | Admit: 2022-07-02 | Discharge: 2022-07-02 | Disposition: A | Discharge status: Home / Self Care | Payer: Medicare Other | Attending: Emergency Medicine | Admitting: Emergency Medicine

## 2022-07-02 ENCOUNTER — Emergency Department (HOSPITAL_COMMUNITY): Payer: Medicare Other

## 2022-07-02 ENCOUNTER — Encounter (HOSPITAL_COMMUNITY): Payer: Self-pay

## 2022-07-02 DIAGNOSIS — Z5900 Homelessness unspecified: Secondary | ICD-10-CM | POA: Insufficient documentation

## 2022-07-02 DIAGNOSIS — S0990XA Unspecified injury of head, initial encounter: Secondary | ICD-10-CM | POA: Diagnosis not present

## 2022-07-02 DIAGNOSIS — E119 Type 2 diabetes mellitus without complications: Secondary | ICD-10-CM | POA: Insufficient documentation

## 2022-07-02 DIAGNOSIS — M47812 Spondylosis without myelopathy or radiculopathy, cervical region: Secondary | ICD-10-CM | POA: Diagnosis not present

## 2022-07-02 DIAGNOSIS — R5383 Other fatigue: Secondary | ICD-10-CM | POA: Diagnosis not present

## 2022-07-02 DIAGNOSIS — S199XXA Unspecified injury of neck, initial encounter: Secondary | ICD-10-CM | POA: Diagnosis not present

## 2022-07-02 DIAGNOSIS — Z79899 Other long term (current) drug therapy: Secondary | ICD-10-CM | POA: Insufficient documentation

## 2022-07-02 DIAGNOSIS — I10 Essential (primary) hypertension: Secondary | ICD-10-CM | POA: Insufficient documentation

## 2022-07-02 DIAGNOSIS — R42 Dizziness and giddiness: Secondary | ICD-10-CM | POA: Diagnosis not present

## 2022-07-02 DIAGNOSIS — J439 Emphysema, unspecified: Secondary | ICD-10-CM | POA: Diagnosis not present

## 2022-07-02 DIAGNOSIS — R404 Transient alteration of awareness: Secondary | ICD-10-CM | POA: Diagnosis not present

## 2022-07-02 DIAGNOSIS — Z743 Need for continuous supervision: Secondary | ICD-10-CM | POA: Diagnosis not present

## 2022-07-02 LAB — COMPREHENSIVE METABOLIC PANEL
ALT: 13 U/L (ref 0–44)
AST: 16 U/L (ref 15–41)
Albumin: 3.8 g/dL (ref 3.5–5.0)
Alkaline Phosphatase: 114 U/L (ref 38–126)
Anion gap: 7 (ref 5–15)
BUN: 10 mg/dL (ref 8–23)
CO2: 26 mmol/L (ref 22–32)
Calcium: 10 mg/dL (ref 8.9–10.3)
Chloride: 107 mmol/L (ref 98–111)
Creatinine, Ser: 0.83 mg/dL (ref 0.44–1.00)
GFR, Estimated: >60 mL/min (ref >60)
Glucose, Bld: 93 mg/dL (ref 70–99)
Potassium: 4.3 mmol/L (ref 3.5–5.1)
Sodium: 140 mmol/L (ref 135–145)
Total Bilirubin: 0.9 mg/dL (ref 0.3–1.2)
Total Protein: 7.8 g/dL (ref 6.5–8.1)

## 2022-07-02 LAB — CBC WITH DIFFERENTIAL/PLATELET
Abs Immature Granulocytes: 0 10*3/uL (ref 0.00–0.07)
Basophils Absolute: 0 10*3/uL (ref 0.0–0.1)
Basophils Relative: 1 %
Eosinophils Absolute: 0.1 10*3/uL (ref 0.0–0.5)
Eosinophils Relative: 1 %
HCT: 40.3 % (ref 36.0–46.0)
Hemoglobin: 13.1 g/dL (ref 12.0–15.0)
Immature Granulocytes: 0 %
Lymphocytes Relative: 36 %
Lymphs Abs: 2.2 10*3/uL (ref 0.7–4.0)
MCH: 27.8 pg (ref 26.0–34.0)
MCHC: 32.5 g/dL (ref 30.0–36.0)
MCV: 85.4 fL (ref 80.0–100.0)
Monocytes Absolute: 0.3 10*3/uL (ref 0.1–1.0)
Monocytes Relative: 5 %
Neutro Abs: 3.5 10*3/uL (ref 1.7–7.7)
Neutrophils Relative %: 57 %
Platelets: 324 10*3/uL (ref 150–400)
RBC: 4.72 MIL/uL (ref 3.87–5.11)
RDW: 14.1 % (ref 11.5–15.5)
WBC: 6.1 10*3/uL (ref 4.0–10.5)
nRBC: 0 % (ref 0.0–0.2)

## 2022-07-02 LAB — URINALYSIS, ROUTINE W REFLEX MICROSCOPIC
Bilirubin Urine: NEGATIVE
Glucose, UA: NEGATIVE mg/dL
Hgb urine dipstick: NEGATIVE
Ketones, ur: NEGATIVE mg/dL
Nitrite: NEGATIVE
Protein, ur: NEGATIVE mg/dL
Specific Gravity, Urine: 1.01 (ref 1.005–1.030)
pH: 5 (ref 5.0–8.0)

## 2022-07-02 LAB — ETHANOL: Alcohol, Ethyl (B): <10 mg/dL (ref <10)

## 2022-07-02 LAB — RAPID URINE DRUG SCREEN, HOSP PERFORMED
Amphetamines: NOT DETECTED
Barbiturates: NOT DETECTED
Benzodiazepines: NOT DETECTED
Cocaine: NOT DETECTED
Opiates: NOT DETECTED
Tetrahydrocannabinol: NOT DETECTED

## 2022-07-02 LAB — CK: Total CK: 75 U/L (ref 38–234)

## 2022-07-02 MED ORDER — LACTATED RINGERS IV BOLUS
1000.0000 mL | Freq: Once | INTRAVENOUS | Status: AC
  Administered 2022-07-02: 1000 mL via INTRAVENOUS

## 2022-07-02 NOTE — ED Triage Notes (Signed)
Patient [place]IB EMS from park. Security Guard at park found patient who was unable to walk and talk at the time. Security Guard states patient was drinking alcohol this morning, patient denies. Patient c/o dizziness and some weakness.   Vital signs were:   122/69 95% room air 85-HR 98-CBG

## 2022-07-02 NOTE — Discharge Instructions (Addendum)
You were seen in the emergency department for evaluation after you were sleeping in the park. Your labs are unremarkable. Make sure you are staying in shaded areas and stayin well hydrated.  Your CT shows "A few scattered small rounded lucent lesions within the cervical vertebral bodies, most notably within the C2, C3, and C6 vertebrae. Findings are nonspecific and could represent benign lesions such as hemangiomas, however metastatic disease or multiple myeloma could have a similar appearance. Further evaluation with nonemergent MRI of the cervical spine with contrast is recommended." Please follow up with your PCP or the oncologist listed on this discharge paperwork. Please call to schedule an appointment as soon as possible. If you have any concern, new or worsening symptoms, please return to the ER for re-evaluation.

## 2022-07-02 NOTE — ED Provider Notes (Signed)
[facility] DEPT Provider Note   CSN: [id number] Arrival date & time: 07/02/22  1435     History No chief complaint on file.   MELEANE [name] is a 66 y.o. female with h/o hypertension, hyperlipidemia, diabetes, schizophrenia  HPI     Home Medications Prior to Admission medications   Medication Sig Start Date End Date Taking? Authorizing Provider  atenolol (TENORMIN) 25 MG tablet Take 0.5 tablets (12.5 mg total) by mouth daily. 01/19/22   Parks Ranger, DO  atorvastatin (LIPITOR) 40 MG tablet Take 1 tablet (40 mg total) by mouth daily. Patient taking differently: Take 40 mg by mouth at bedtime. 01/18/22 02/17/22  Parks Ranger, DO  divalproex (DEPAKOTE) 500 MG DR tablet Take 1 tablet (500 mg total) by mouth at bedtime. 01/18/22 04/18/22  Parks Ranger, DO  ibuprofen (ADVIL) 800 MG tablet Take 800 mg by mouth every 8 (eight) hours as needed for headache or moderate pain.    [provider]  lisinopril (ZESTRIL) 10 MG tablet Take 1 tablet (10 mg total) by mouth daily. Patient taking differently: Take 10 mg by mouth every evening. 01/18/22 02/17/22  Parks Ranger, DO  methimazole (TAPAZOLE) 10 MG tablet Take 1 tablet (10 mg total) by mouth daily. 01/18/22 02/17/22  Parks Ranger, DO  OLANZapine (ZYPREXA) 10 MG tablet Take 1 tablet (10 mg total) by mouth at bedtime. 06/29/22   Lucky Rathke, FNP  pantoprazole (PROTONIX) 40 MG tablet Take 1 tablet (40 mg total) by mouth 2 (two) times daily before a meal. 01/18/22 02/17/22  Parks Ranger, DO  potassium chloride SA (KLOR-CON M) 20 MEQ tablet Take 1 tablet (20 mEq total) by mouth 2 (two) times daily. 9/38/10   Delora Fuel, MD  traZODone (DESYREL) 50 MG tablet Take 1 tablet (50 mg total) by mouth at bedtime. Patient taking differently: Take 50 mg by mouth at bedtime as needed for sleep. 01/18/22 04/18/22  Parks Ranger, DO      Allergies     Aspirin, Chocolate, Cocoa, Penicillins, and Sulfa antibiotics    Review of Systems   Review of Systems  Physical Exam Updated Vital Signs BP 125/63   Pulse 70   Temp 98.8 F (37.1 C) (Oral)   Resp 17   Ht $R'5\' 4"'tT$  (1.626 m)   Wt 64.8 kg   SpO2 97%   BMI 24.52 kg/m  Physical Exam  ED Results / Procedures / Treatments   Labs (all labs ordered are listed, but only abnormal results are displayed) Labs Reviewed  CK  CBC WITH DIFFERENTIAL/PLATELET  COMPREHENSIVE METABOLIC PANEL  ETHANOL  URINALYSIS, ROUTINE W REFLEX MICROSCOPIC  RAPID URINE DRUG SCREEN, HOSP PERFORMED    EKG None  Radiology CT Head Wo Contrast  Result Date: 07/02/2022 CLINICAL DATA:  Head trauma, moderate-severe; Neck trauma (Age >= 65y) EXAM: CT HEAD WITHOUT CONTRAST CT CERVICAL SPINE WITHOUT CONTRAST TECHNIQUE: Multidetector CT imaging of the head and cervical spine was performed following the standard protocol without intravenous contrast. Multiplanar CT image reconstructions of the cervical spine were also generated. RADIATION DOSE REDUCTION: This exam was performed according to the departmental dose-optimization program which includes automated exposure control, adjustment of the mA and/or kV according to patient size and/or use of iterative reconstruction technique. COMPARISON:  10/28/2019 FINDINGS: CT HEAD FINDINGS Brain: No evidence of acute infarction, hemorrhage, hydrocephalus, extra-axial collection or mass lesion/mass effect. Vascular: No hyperdense vessel or unexpected calcification. Skull: Normal. Negative for fracture or  focal lesion. Sinuses/Orbits: Right orbital prosthesis.  No acute findings. Other: None. CT CERVICAL SPINE FINDINGS Alignment: Facet joints are aligned without dislocation or traumatic listhesis. Dens and lateral masses are aligned. Skull base and vertebrae: No acute fracture. There are a few scattered small rounded non expansile lucent lesions within the cervical vertebral bodies, most  notably within the C2, C3, and C6 vertebrae. Soft tissues and spinal canal: No prevertebral fluid or swelling. No visible canal hematoma. Disc levels: Intervertebral disc heights are preserved. Mild-to-moderate multilevel cervical facet arthropathy. Upper chest: Emphysematous changes within the included lung apices. Other: Bilateral carotid atherosclerosis. IMPRESSION: 1. No acute intracranial abnormality. 2. No acute fracture or subluxation of the cervical spine. 3. A few scattered small rounded lucent lesions within the cervical vertebral bodies, most notably within the C2, C3, and C6 vertebrae. Findings are nonspecific and could represent benign lesions such as hemangiomas, however metastatic disease or multiple myeloma could have a similar appearance. Further evaluation with nonemergent MRI of the cervical spine with contrast is recommended. Emphysema (ICD10-J43.9). Electronically Signed   By: Davina Poke D.O.   On: 07/02/2022 16:36   CT Cervical Spine Wo Contrast  Result Date: 07/02/2022 CLINICAL DATA:  Head trauma, moderate-severe; Neck trauma (Age >= 65y) EXAM: CT HEAD WITHOUT CONTRAST CT CERVICAL SPINE WITHOUT CONTRAST TECHNIQUE: Multidetector CT imaging of the head and cervical spine was performed following the standard protocol without intravenous contrast. Multiplanar CT image reconstructions of the cervical spine were also generated. RADIATION DOSE REDUCTION: This exam was performed according to the departmental dose-optimization program which includes automated exposure control, adjustment of the mA and/or kV according to patient size and/or use of iterative reconstruction technique. COMPARISON:  10/28/2019 FINDINGS: CT HEAD FINDINGS Brain: No evidence of acute infarction, hemorrhage, hydrocephalus, extra-axial collection or mass lesion/mass effect. Vascular: No hyperdense vessel or unexpected calcification. Skull: Normal. Negative for fracture or focal lesion. Sinuses/Orbits: Right orbital  prosthesis.  No acute findings. Other: None. CT CERVICAL SPINE FINDINGS Alignment: Facet joints are aligned without dislocation or traumatic listhesis. Dens and lateral masses are aligned. Skull base and vertebrae: No acute fracture. There are a few scattered small rounded non expansile lucent lesions within the cervical vertebral bodies, most notably within the C2, C3, and C6 vertebrae. Soft tissues and spinal canal: No prevertebral fluid or swelling. No visible canal hematoma. Disc levels: Intervertebral disc heights are preserved. Mild-to-moderate multilevel cervical facet arthropathy. Upper chest: Emphysematous changes within the included lung apices. Other: Bilateral carotid atherosclerosis. IMPRESSION: 1. No acute intracranial abnormality. 2. No acute fracture or subluxation of the cervical spine. 3. A few scattered small rounded lucent lesions within the cervical vertebral bodies, most notably within the C2, C3, and C6 vertebrae. Findings are nonspecific and could represent benign lesions such as hemangiomas, however metastatic disease or multiple myeloma could have a similar appearance. Further evaluation with nonemergent MRI of the cervical spine with contrast is recommended. Emphysema (ICD10-J43.9). Electronically Signed   By: Davina Poke D.O.   On: 07/02/2022 16:36   DG Chest 2 View  Result Date: 07/01/2022 CLINICAL DATA:  Chest pain EXAM: CHEST - 2 VIEW COMPARISON:  06/29/2022 FINDINGS: Lungs are clear.  No pleural effusion or pneumothorax. The heart is normal in size. Visualized osseous structures are within normal limits. IMPRESSION: Normal chest radiographs. Electronically Signed   By: Julian Hy M.D.   On: 07/01/2022 00:14    Procedures Procedures   Medications Ordered in ED Medications  lactated ringers bolus 1,000 mL (1,000 mLs  Intravenous New Bag/Given 07/02/22 1643)    ED Course/ Medical Decision Making/ A&P Clinical Course as of 07/02/22 2029  Sat Jul 02, 2022  1941  Reassessed patient.  [name] alert and oriented x3.  Denies any illicit drug use or alcohol use.  Says that she is currently homeless and is resting.  Denies any headache or neck pain.  Says that she does have mild pain in her back which was left and paraspinal in her mid thoracic region.  No midline tenderness or step-offs.  [name] moving all 4 extremities equally.  [RP]    Clinical Course User Index [RP] Fransico Meadow, MD                           Medical Decision Making Amount and/or Complexity of Data Reviewed Labs: ordered. Radiology: ordered.   ***  I discussed this case with my attending physician who cosigned this note including patient's presenting symptoms, physical exam, and planned diagnostics and interventions. Attending physician stated agreement with plan or made changes to plan which were implemented.   Attending physician assessed patient at bedside.  Final Clinical Impression(s) / ED Diagnoses Final diagnoses:  Homelessness  Other fatigue    Rx / DC Orders ED Discharge Orders     None

## 2022-07-19 ENCOUNTER — Encounter: Payer: Self-pay | Admitting: *Deleted

## 2022-09-12 ENCOUNTER — Other Ambulatory Visit: Payer: Self-pay

## 2022-11-03 ENCOUNTER — Encounter (HOSPITAL_COMMUNITY): Payer: Self-pay | Admitting: Emergency Medicine

## 2022-11-03 ENCOUNTER — Emergency Department (HOSPITAL_COMMUNITY)
Admission: EM | Admit: 2022-11-03 | Discharge: 2022-11-03 | Disposition: A | Discharge status: Home / Self Care | Payer: Medicare Other | Attending: Emergency Medicine | Admitting: Emergency Medicine

## 2022-11-03 ENCOUNTER — Emergency Department (HOSPITAL_COMMUNITY): Payer: Medicare Other

## 2022-11-03 ENCOUNTER — Other Ambulatory Visit: Payer: Self-pay

## 2022-11-03 DIAGNOSIS — R059 Cough, unspecified: Secondary | ICD-10-CM | POA: Diagnosis not present

## 2022-11-03 DIAGNOSIS — I499 Cardiac arrhythmia, unspecified: Secondary | ICD-10-CM | POA: Diagnosis not present

## 2022-11-03 DIAGNOSIS — J439 Emphysema, unspecified: Secondary | ICD-10-CM | POA: Diagnosis not present

## 2022-11-03 DIAGNOSIS — R079 Chest pain, unspecified: Secondary | ICD-10-CM | POA: Diagnosis not present

## 2022-11-03 DIAGNOSIS — M549 Dorsalgia, unspecified: Secondary | ICD-10-CM | POA: Diagnosis not present

## 2022-11-03 DIAGNOSIS — R6889 Other general symptoms and signs: Secondary | ICD-10-CM | POA: Diagnosis not present

## 2022-11-03 DIAGNOSIS — Z743 Need for continuous supervision: Secondary | ICD-10-CM | POA: Diagnosis not present

## 2022-11-03 DIAGNOSIS — R0789 Other chest pain: Secondary | ICD-10-CM | POA: Diagnosis not present

## 2022-11-03 DIAGNOSIS — R0602 Shortness of breath: Secondary | ICD-10-CM | POA: Diagnosis not present

## 2022-11-03 DIAGNOSIS — R0600 Dyspnea, unspecified: Secondary | ICD-10-CM | POA: Diagnosis not present

## 2022-11-03 LAB — TROPONIN I (HIGH SENSITIVITY): Troponin I (High Sensitivity): 7 ng/L (ref <18)

## 2022-11-03 LAB — COMPREHENSIVE METABOLIC PANEL
ALT: 19 U/L (ref 0–44)
AST: 30 U/L (ref 15–41)
Albumin: 3.2 g/dL — ABNORMAL LOW (ref 3.5–5.0)
Alkaline Phosphatase: 78 U/L (ref 38–126)
Anion gap: 8 (ref 5–15)
BUN: 33 mg/dL — ABNORMAL HIGH (ref 8–23)
CO2: 23 mmol/L (ref 22–32)
Calcium: 9.1 mg/dL (ref 8.9–10.3)
Chloride: 108 mmol/L (ref 98–111)
Creatinine, Ser: 0.74 mg/dL (ref 0.44–1.00)
GFR, Estimated: >60 mL/min (ref >60)
Glucose, Bld: 99 mg/dL (ref 70–99)
Potassium: 3.8 mmol/L (ref 3.5–5.1)
Sodium: 139 mmol/L (ref 135–145)
Total Bilirubin: 0.3 mg/dL (ref 0.3–1.2)
Total Protein: 6.5 g/dL (ref 6.5–8.1)

## 2022-11-03 LAB — CBC WITH DIFFERENTIAL/PLATELET
Abs Immature Granulocytes: 0.01 10*3/uL (ref 0.00–0.07)
Basophils Absolute: 0 10*3/uL (ref 0.0–0.1)
Basophils Relative: 0 %
Eosinophils Absolute: 0 10*3/uL (ref 0.0–0.5)
Eosinophils Relative: 0 %
HCT: 37.1 % (ref 36.0–46.0)
Hemoglobin: 12.1 g/dL (ref 12.0–15.0)
Immature Granulocytes: 0 %
Lymphocytes Relative: 35 %
Lymphs Abs: 0.9 10*3/uL (ref 0.7–4.0)
MCH: 28.4 pg (ref 26.0–34.0)
MCHC: 32.6 g/dL (ref 30.0–36.0)
MCV: 87.1 fL (ref 80.0–100.0)
Monocytes Absolute: 0.3 10*3/uL (ref 0.1–1.0)
Monocytes Relative: 12 %
Neutro Abs: 1.4 10*3/uL — ABNORMAL LOW (ref 1.7–7.7)
Neutrophils Relative %: 53 %
Platelets: 177 10*3/uL (ref 150–400)
RBC: 4.26 MIL/uL (ref 3.87–5.11)
RDW: 13.4 % (ref 11.5–15.5)
WBC: 2.6 10*3/uL — ABNORMAL LOW (ref 4.0–10.5)
nRBC: 0 % (ref 0.0–0.2)

## 2022-11-03 LAB — D-DIMER, QUANTITATIVE: D-Dimer, Quant: 0.74 ug/mL-FEU — ABNORMAL HIGH (ref 0.00–0.50)

## 2022-11-03 LAB — BRAIN NATRIURETIC PEPTIDE: B Natriuretic Peptide: 9.1 pg/mL (ref 0.0–100.0)

## 2022-11-03 MED ORDER — PANTOPRAZOLE SODIUM 40 MG PO TBEC: 40.0000 mg | DELAYED_RELEASE_TABLET | Freq: Two times a day (BID) | ORAL | 0 refills | Status: DC

## 2022-11-03 MED ORDER — ATENOLOL 25 MG PO TABS
12.5000 mg | ORAL_TABLET | Freq: Once | ORAL | Status: AC
  Administered 2022-11-03: 12.5 mg via ORAL
  Filled 2022-11-03 (×2): qty 1

## 2022-11-03 MED ORDER — METOPROLOL TARTRATE 5 MG/5ML IV SOLN
5.0000 mg | Freq: Once | INTRAVENOUS | Status: AC
  Administered 2022-11-03: 5 mg via INTRAVENOUS
  Filled 2022-11-03: qty 5

## 2022-11-03 MED ORDER — LISINOPRIL 10 MG PO TABS: 10.0000 mg | ORAL_TABLET | Freq: Every evening | ORAL | 0 refills | Status: DC

## 2022-11-03 MED ORDER — ACETAMINOPHEN 500 MG PO TABS
1000.0000 mg | ORAL_TABLET | Freq: Once | ORAL | Status: AC
  Administered 2022-11-03: 1000 mg via ORAL
  Filled 2022-11-03: qty 2

## 2022-11-03 MED ORDER — ATENOLOL 25 MG PO TABS
12.5000 mg | ORAL_TABLET | Freq: Every day | ORAL | 0 refills | Status: DC
Start: 2022-11-03 — End: 2024-03-16

## 2022-11-03 MED ORDER — OLANZAPINE 10 MG PO TABS: 10.0000 mg | ORAL_TABLET | Freq: Every day | ORAL | 0 refills | Status: DC

## 2022-11-03 MED ORDER — ATORVASTATIN CALCIUM 40 MG PO TABS: 40.0000 mg | ORAL_TABLET | Freq: Every day | ORAL | 0 refills | Status: DC

## 2022-11-03 MED ORDER — IOHEXOL 350 MG/ML SOLN
65.0000 mL | Freq: Once | INTRAVENOUS | Status: AC | PRN
  Administered 2022-11-03: 65 mL via INTRAVENOUS

## 2022-11-03 MED ORDER — DIVALPROEX SODIUM 500 MG PO DR TAB: 500.0000 mg | DELAYED_RELEASE_TABLET | Freq: Every day | ORAL | 2 refills | Status: DC

## 2022-11-03 MED ORDER — TRAZODONE HCL 50 MG PO TABS: 50.0000 mg | ORAL_TABLET | Freq: Every evening | ORAL | 0 refills | Status: DC | PRN

## 2022-11-03 NOTE — ED Notes (Signed)
CMP recollected and sent to lab per request along with troponin

## 2022-11-03 NOTE — ED Provider Notes (Signed)
[facility] EMERGENCY DEPARTMENT Provider Note   CSN: [id number] Arrival date & time: 11/03/22  1221     History  Chief Complaint  Patient presents with   Chest Pain    Shari [name] is a 66 y.o. female.  Patient presents with chest pain radiates to right and middle neck and mildly to the back.  Patient has had cough mild productive for the past 2 days and does correlate with timing of anterior chest pressure.  History of atrial fibrillation not on anticoagulation but in the past is taking atenolol.  Chest discomfort minimal at this time.  No history of stents or ACS or stroke known.  Patient has mild shortness of breath, no classic blood clot risk factors.       Home Medications Prior to Admission medications   Medication Sig Start Date End Date Taking? Authorizing Provider  atenolol (TENORMIN) 25 MG tablet Take 0.5 tablets (12.5 mg total) by mouth daily. 01/19/22   Sarina Ill, DO  atorvastatin (LIPITOR) 40 MG tablet Take 1 tablet (40 mg total) by mouth daily. Patient taking differently: Take 40 mg by mouth at bedtime. 01/18/22 02/17/22  Sarina Ill, DO  divalproex (DEPAKOTE) 500 MG DR tablet Take 1 tablet (500 mg total) by mouth at bedtime. 01/18/22 04/18/22  Sarina Ill, DO  ibuprofen (ADVIL) 800 MG tablet Take 800 mg by mouth every 8 (eight) hours as needed for headache or moderate pain.    [provider]  lisinopril (ZESTRIL) 10 MG tablet Take 1 tablet (10 mg total) by mouth daily. Patient taking differently: Take 10 mg by mouth every evening. 01/18/22 02/17/22  Sarina Ill, DO  methimazole (TAPAZOLE) 10 MG tablet Take 1 tablet (10 mg total) by mouth daily. 01/18/22 02/17/22  Sarina Ill, DO  OLANZapine (ZYPREXA) 10 MG tablet Take 1 tablet (10 mg total) by mouth at bedtime. 06/29/22   Lenard Lance, FNP  pantoprazole (PROTONIX) 40 MG tablet Take 1 tablet (40 mg total) by mouth 2 (two) times daily  before a meal. 01/18/22 02/17/22  Sarina Ill, DO  potassium chloride SA (KLOR-CON M) 20 MEQ tablet Take 1 tablet (20 mEq total) by mouth 2 (two) times daily. 06/16/22   Dione Booze, MD  traZODone (DESYREL) 50 MG tablet Take 1 tablet (50 mg total) by mouth at bedtime. Patient taking differently: Take 50 mg by mouth at bedtime as needed for sleep. 01/18/22 04/18/22  Sarina Ill, DO      Allergies    Aspirin, Chocolate, Cocoa, Penicillins, and Sulfa antibiotics    Review of Systems   Review of Systems  Constitutional:  Negative for chills and fever.  HENT:  Positive for congestion.   Eyes:  Negative for visual disturbance.  Respiratory:  Positive for cough and shortness of breath.   Cardiovascular:  Positive for chest pain. Negative for leg swelling.  Gastrointestinal:  Negative for abdominal pain and vomiting.  Genitourinary:  Negative for dysuria and flank pain.  Musculoskeletal:  Negative for back pain, neck pain and neck stiffness.  Skin:  Negative for rash.  Neurological:  Negative for light-headedness and headaches.    Physical Exam Updated Vital Signs BP 131/61 (BP Location: Right Arm)   Pulse (!) 46   Temp 98.1 F (36.7 C) (Oral)   Resp 19   Ht 5\' 4"  (1.626 m)   Wt 59 kg   SpO2 93%   BMI 22.31 kg/m  Physical Exam Vitals and  nursing note reviewed.  Constitutional:      General: [name] not in acute distress.    Appearance: [name] well-developed.  HENT:     Head: Normocephalic and atraumatic.     Mouth/Throat:     Mouth: Mucous membranes are moist.  Eyes:     General:        Right eye: No discharge.        Left eye: No discharge.     Conjunctiva/sclera: Conjunctivae normal.  Neck:     Trachea: No tracheal deviation.  Cardiovascular:     Rate and Rhythm: Normal rate and regular rhythm.     Pulses:          Radial pulses are 2+ on the right side and 2+ on the left side.     Heart sounds: Normal heart sounds. No murmur heard. Pulmonary:      Effort: Pulmonary effort is normal.     Breath sounds: Normal breath sounds.  Abdominal:     General: There is no distension.     Palpations: Abdomen is soft.     Tenderness: There is no abdominal tenderness. There is no guarding.  Musculoskeletal:     Cervical back: Normal range of motion and neck supple. No rigidity.     Right lower leg: No tenderness. No edema.     Left lower leg: No tenderness. No edema.  Skin:    General: Skin is warm.     Capillary Refill: Capillary refill takes less than 2 seconds.     Findings: No rash.  Neurological:     General: No focal deficit present.     Mental Status: [name] alert.     Cranial Nerves: No cranial nerve deficit.  Psychiatric:        Mood and Affect: Mood normal.     ED Results / Procedures / Treatments   Labs (all labs ordered are listed, but only abnormal results are displayed) Labs Reviewed  CBC WITH DIFFERENTIAL/PLATELET - Abnormal; Notable for the following components:      Result Value   WBC 2.6 (*)    Neutro Abs 1.4 (*)    All other components within normal limits  D-DIMER, QUANTITATIVE - Abnormal; Notable for the following components:   D-Dimer, Quant 0.74 (*)    All other components within normal limits  BRAIN NATRIURETIC PEPTIDE  COMPREHENSIVE METABOLIC PANEL  TROPONIN I (HIGH SENSITIVITY)  TROPONIN I (HIGH SENSITIVITY)    EKG EKG Interpretation  Date/Time:  Thursday November 03 2022 12:22:04 EST Ventricular Rate:  132 PR Interval:  143 QRS Duration: 82 QT Interval:  391 QTC Calculation: 548 R Axis:   58 Text Interpretation: Sinus tachycardia Multiform ventricular premature complexes LAE, consider biatrial enlargement Abnormal R-wave progression, early transition Probable left ventricular hypertrophy Abnormal T, consider ischemia, diffuse leads Prolonged QT interval Confirmed by Blane Ohara [phone]) on 11/03/2022 12:42:42 PM  Radiology DG Chest Portable 1 View  Result Date: 11/03/2022 CLINICAL DATA:   Dyspnea, chest pain EXAM: PORTABLE CHEST 1 VIEW COMPARISON:  07/01/2022 chest radiograph. FINDINGS: Stable cardiomediastinal silhouette with normal heart size. No pneumothorax. No pleural effusion. Lungs appear clear, with no acute consolidative airspace disease and no pulmonary edema. IMPRESSION: No active disease. Electronically Signed   By: Delbert Phenix M.D.   On: 11/03/2022 13:38    Procedures Procedures    Medications Ordered in ED Medications  acetaminophen (TYLENOL) tablet 1,000 mg (1,000 mg Oral Given 11/03/22 1302)    ED Course/  Medical Decision Making/ A&P                           Medical Decision Making Amount and/or Complexity of Data Reviewed Labs: ordered. Radiology: ordered.  Risk OTC drugs.   Patient presents with fairly persistent chest pain for over 2 days associated with a cough as well.  Patient does have risk factors for ACS, plan for delta troponin, EKG reviewed showing sinus/multifocal atrial tachycardia type.  Other differentials include pulm embolism patient very low risk, D-dimer sent.  Patient does associate timing with cough and congestion so pneumonia and pleuritis on the differential in addition to pericarditis. Patient denies drug use, does use cigarettes occasionally.  Hemoglobin reviewed unremarkable, electrolytes unremarkable reviewed.  D-dimer mild positive CT scan pending.  Plan follow-up blood work, monitoring.  Tylenol as needed for pain.  Patient's medical records reviewed her echo was done in 2020 normal systolic ejection fraction no wall motion abnormalities.  Delay in troponins, recent by nursing discussed with him.  Patient care be signed out to follow-up troponins and CT pulmonary embolism results for final disposition.  Patient pain still present plan for pain medicines.        Final Clinical Impression(s) / ED Diagnoses Final diagnoses:  Acute chest pain    Rx / DC Orders ED Discharge Orders     None         Blane Ohara, MD 11/03/22 1510

## 2022-11-03 NOTE — ED Notes (Signed)
Patient transported to CT 

## 2022-11-03 NOTE — ED Triage Notes (Signed)
Pt BIB [facility] from home due to chest pain that radiates to right side of neck and back.  Pt states that she has had a cough along with it for 2 days.  Hx of A-fib.  Pt normally takes atenolol and has not taken it for a while.  VSS.  20g left hand.  NS given en route.

## 2022-11-03 NOTE — ED Provider Notes (Signed)
Patient handed off to me pending troponin and CT scan.  Here with nonspecific chest discomfort.  Seems like this is more of a social visit.  [name] asking for medication refill.  Does not have any chest pain to my evaluation.  She has been having some tachycardic rhythms while here.  She was given her home atenolol dose and Lopressor and heart rate improved..  She has sinus tachycardia with PVCs.  History of the same.  Troponin is normal.  There is no significant anemia or electrolyte abnormality or kidney injury.  PE scan shows no blood clot.  Overall she is well-appearing.  I have written her for her home medications.  I have referred her to cardiology and primary care.  Discharged in good condition.  This chart was dictated using voice recognition software.  Despite best efforts to proofread,  errors can occur which can change the documentation meaning.    Virgina Norfolk, DO 11/03/22 1954

## 2022-11-21 ENCOUNTER — Telehealth: Payer: Self-pay

## 2022-11-21 NOTE — Patient Outreach (Signed)
  Care Coordination   11/21/2022 Name: Shari [name] MRN: [id number] DOB: [birth year]   Care Coordination Outreach Attempts:  An unsuccessful telephone outreach was attempted today to offer the patient information about available care coordination services as a benefit of their health plan.   Follow Up Plan:  Additional outreach attempts will be made to offer the patient care coordination information and services.   Encounter Outcome:  No Answer   Care Coordination Interventions:  No, not indicated    Rowe Pavy, RN, BSN, [facility] [employer] [phone]

## 2022-11-25 ENCOUNTER — Telehealth: Payer: Self-pay

## 2022-11-25 NOTE — Patient Outreach (Signed)
  Care Coordination   11/25/2022 Name: Shari [name] MRN: [id number] DOB: 1956-06-09   Care Coordination Outreach Attempts:  A second unsuccessful outreach was attempted today to offer the patient with information about available care coordination services as a benefit of their health plan.     Follow Up Plan:  Additional outreach attempts will be made to offer the patient care coordination information and services.   Encounter Outcome:  No Answer   Care Coordination Interventions:  No, not indicated    Rowe Pavy, RN, BSN, [facility] [employer] [phone]

## 2022-11-30 ENCOUNTER — Telehealth: Payer: Self-pay

## 2022-11-30 NOTE — Patient Outreach (Signed)
  Care Coordination   11/30/2022 Name: Shari [name] MRN: [id number] DOB: August 09, 1956   Care Coordination Outreach Attempts:  A third unsuccessful outreach was attempted today to offer the patient with information about available care coordination services as a benefit of their health plan.   Follow Up Plan:  No further outreach attempts will be made at this time. We have been unable to contact the patient to offer or enroll patient in care coordination services  Encounter Outcome:  No Answer   Care Coordination Interventions:  No, not indicated    Rowe Pavy, RN, BSN, [facility] Coordinator [phone]

## 2023-02-02 DIAGNOSIS — R0602 Shortness of breath: Secondary | ICD-10-CM | POA: Diagnosis not present

## 2023-02-02 DIAGNOSIS — R030 Elevated blood-pressure reading, without diagnosis of hypertension: Secondary | ICD-10-CM | POA: Diagnosis not present

## 2023-02-02 DIAGNOSIS — E559 Vitamin D deficiency, unspecified: Secondary | ICD-10-CM | POA: Diagnosis not present

## 2023-02-02 DIAGNOSIS — D539 Nutritional anemia, unspecified: Secondary | ICD-10-CM | POA: Diagnosis not present

## 2023-02-02 DIAGNOSIS — R5383 Other fatigue: Secondary | ICD-10-CM | POA: Diagnosis not present

## 2023-02-02 DIAGNOSIS — I10 Essential (primary) hypertension: Secondary | ICD-10-CM | POA: Diagnosis not present

## 2023-02-02 DIAGNOSIS — Z1159 Encounter for screening for other viral diseases: Secondary | ICD-10-CM | POA: Diagnosis not present

## 2023-02-02 DIAGNOSIS — Z6820 Body mass index (BMI) 20.0-20.9, adult: Secondary | ICD-10-CM | POA: Diagnosis not present

## 2023-02-02 DIAGNOSIS — Z79899 Other long term (current) drug therapy: Secondary | ICD-10-CM | POA: Diagnosis not present

## 2023-02-02 DIAGNOSIS — Z0000 Encounter for general adult medical examination without abnormal findings: Secondary | ICD-10-CM | POA: Diagnosis not present

## 2023-02-08 DIAGNOSIS — Z6821 Body mass index (BMI) 21.0-21.9, adult: Secondary | ICD-10-CM | POA: Diagnosis not present

## 2023-02-08 DIAGNOSIS — R300 Dysuria: Secondary | ICD-10-CM | POA: Diagnosis not present

## 2023-02-08 DIAGNOSIS — E0500 Thyrotoxicosis with diffuse goiter without thyrotoxic crisis or storm: Secondary | ICD-10-CM | POA: Diagnosis not present

## 2023-02-08 DIAGNOSIS — I10 Essential (primary) hypertension: Secondary | ICD-10-CM | POA: Diagnosis not present

## 2023-02-08 DIAGNOSIS — Z780 Asymptomatic menopausal state: Secondary | ICD-10-CM | POA: Diagnosis not present

## 2023-02-08 DIAGNOSIS — R030 Elevated blood-pressure reading, without diagnosis of hypertension: Secondary | ICD-10-CM | POA: Diagnosis not present

## 2023-02-08 DIAGNOSIS — R0602 Shortness of breath: Secondary | ICD-10-CM | POA: Diagnosis not present

## 2023-02-08 DIAGNOSIS — E785 Hyperlipidemia, unspecified: Secondary | ICD-10-CM | POA: Diagnosis not present

## 2023-02-08 DIAGNOSIS — E781 Pure hyperglyceridemia: Secondary | ICD-10-CM | POA: Diagnosis not present

## 2023-02-08 DIAGNOSIS — Z1231 Encounter for screening mammogram for malignant neoplasm of breast: Secondary | ICD-10-CM | POA: Diagnosis not present

## 2023-02-08 DIAGNOSIS — E559 Vitamin D deficiency, unspecified: Secondary | ICD-10-CM | POA: Diagnosis not present

## 2023-02-28 DIAGNOSIS — Z1211 Encounter for screening for malignant neoplasm of colon: Secondary | ICD-10-CM | POA: Diagnosis not present

## 2023-03-06 ENCOUNTER — Encounter (HOSPITAL_COMMUNITY): Payer: Self-pay | Admitting: Radiology

## 2023-03-06 ENCOUNTER — Other Ambulatory Visit: Payer: Self-pay

## 2023-03-06 ENCOUNTER — Emergency Department (HOSPITAL_COMMUNITY)
Admission: EM | Admit: 2023-03-06 | Discharge: 2023-03-07 | Disposition: A | Discharge status: Home / Self Care | Payer: 59 | Attending: Emergency Medicine | Admitting: Emergency Medicine

## 2023-03-06 DIAGNOSIS — R42 Dizziness and giddiness: Secondary | ICD-10-CM | POA: Insufficient documentation

## 2023-03-06 DIAGNOSIS — I10 Essential (primary) hypertension: Secondary | ICD-10-CM | POA: Diagnosis not present

## 2023-03-06 DIAGNOSIS — Z743 Need for continuous supervision: Secondary | ICD-10-CM | POA: Diagnosis not present

## 2023-03-06 DIAGNOSIS — R519 Headache, unspecified: Secondary | ICD-10-CM | POA: Diagnosis not present

## 2023-03-06 DIAGNOSIS — R110 Nausea: Secondary | ICD-10-CM | POA: Diagnosis not present

## 2023-03-06 DIAGNOSIS — R29818 Other symptoms and signs involving the nervous system: Secondary | ICD-10-CM | POA: Diagnosis not present

## 2023-03-06 DIAGNOSIS — E039 Hypothyroidism, unspecified: Secondary | ICD-10-CM | POA: Insufficient documentation

## 2023-03-06 DIAGNOSIS — R1111 Vomiting without nausea: Secondary | ICD-10-CM | POA: Diagnosis not present

## 2023-03-06 DIAGNOSIS — R6889 Other general symptoms and signs: Secondary | ICD-10-CM | POA: Diagnosis not present

## 2023-03-06 DIAGNOSIS — R112 Nausea with vomiting, unspecified: Secondary | ICD-10-CM | POA: Diagnosis not present

## 2023-03-06 DIAGNOSIS — Z79899 Other long term (current) drug therapy: Secondary | ICD-10-CM | POA: Diagnosis not present

## 2023-03-06 DIAGNOSIS — N281 Cyst of kidney, acquired: Secondary | ICD-10-CM | POA: Diagnosis not present

## 2023-03-06 LAB — CBC WITH DIFFERENTIAL/PLATELET
Abs Immature Granulocytes: 0.04 10*3/uL (ref 0.00–0.07)
Basophils Absolute: 0 10*3/uL (ref 0.0–0.1)
Basophils Relative: 0 %
Eosinophils Absolute: 0 10*3/uL (ref 0.0–0.5)
Eosinophils Relative: 0 %
HCT: 39.7 % (ref 36.0–46.0)
Hemoglobin: 12.9 g/dL (ref 12.0–15.0)
Immature Granulocytes: 0 %
Lymphocytes Relative: 8 %
Lymphs Abs: 0.9 10*3/uL (ref 0.7–4.0)
MCH: 27.3 pg (ref 26.0–34.0)
MCHC: 32.5 g/dL (ref 30.0–36.0)
MCV: 84.1 fL (ref 80.0–100.0)
Monocytes Absolute: 0.8 10*3/uL (ref 0.1–1.0)
Monocytes Relative: 7 %
Neutro Abs: 8.9 10*3/uL — ABNORMAL HIGH (ref 1.7–7.7)
Neutrophils Relative %: 85 %
Platelets: 351 10*3/uL (ref 150–400)
RBC: 4.72 MIL/uL (ref 3.87–5.11)
RDW: 13.2 % (ref 11.5–15.5)
WBC: 10.7 10*3/uL — ABNORMAL HIGH (ref 4.0–10.5)
nRBC: 0 % (ref 0.0–0.2)

## 2023-03-06 LAB — COMPREHENSIVE METABOLIC PANEL
ALT: 22 U/L (ref 0–44)
AST: 20 U/L (ref 15–41)
Albumin: 3.7 g/dL (ref 3.5–5.0)
Alkaline Phosphatase: 160 U/L — ABNORMAL HIGH (ref 38–126)
Anion gap: 13 (ref 5–15)
BUN: 14 mg/dL (ref 8–23)
CO2: 24 mmol/L (ref 22–32)
Calcium: 10.1 mg/dL (ref 8.9–10.3)
Chloride: 106 mmol/L (ref 98–111)
Creatinine, Ser: 1.16 mg/dL — ABNORMAL HIGH (ref 0.44–1.00)
GFR, Estimated: 52 mL/min — ABNORMAL LOW (ref >60)
Glucose, Bld: 110 mg/dL — ABNORMAL HIGH (ref 70–99)
Potassium: 4 mmol/L (ref 3.5–5.1)
Sodium: 143 mmol/L (ref 135–145)
Total Bilirubin: 0.5 mg/dL (ref 0.3–1.2)
Total Protein: 7.7 g/dL (ref 6.5–8.1)

## 2023-03-06 LAB — LIPASE, BLOOD: Lipase: 34 U/L (ref 11–51)

## 2023-03-06 MED ORDER — METOCLOPRAMIDE HCL 5 MG/ML IJ SOLN
10.0000 mg | Freq: Once | INTRAMUSCULAR | Status: AC
  Administered 2023-03-07: 10 mg via INTRAVENOUS
  Filled 2023-03-06: qty 2

## 2023-03-06 MED ORDER — ONDANSETRON HCL 4 MG/2ML IJ SOLN: 4.0000 mg | Freq: Once | INTRAMUSCULAR | Status: DC

## 2023-03-06 MED ORDER — SODIUM CHLORIDE 0.9 % IV BOLUS
500.0000 mL | Freq: Once | INTRAVENOUS | Status: AC
  Administered 2023-03-07: 500 mL via INTRAVENOUS

## 2023-03-06 NOTE — ED Triage Notes (Signed)
Pt states she ate a sandwich that was given to her about an hour ago and has vomited multiple times. Pt unsure if sandwich was old. Pt endorses feeling a bit dizzy. Pt also endorses being out of her atenolol for 1 week.

## 2023-03-06 NOTE — ED Provider Triage Note (Signed)
Emergency Medicine Provider Triage Evaluation Note  Shari [name] , a 67 y.o. female  was evaluated in triage.  Pt complains of n/v.  Patient arrives [facility], she reports that her roommate gave her half of a chicken sandwich and she felt nauseated and vomited shortly after.  Feels like she is going to have diarrhea as well.  No fevers or chills, no dyspnea.  Does not believe the same which had any illicit substance in it, denies any recent drug use.  Review of Systems  Positive: nausea Negative: dib  Physical Exam  BP (!) 100/56 (BP Location: Right Arm)   Pulse 89   Temp 98.3 F (36.8 C) (Oral)   Resp 17   Ht 5\' 4"  (1.626 m)   Wt 55.8 kg   SpO2 98%   BMI 21.11 kg/m  Gen:   Awake, no distress   Resp:  Normal effort  MSK:   Moves extremities without difficulty  Other:  Abd not peritoneal  Medical Decision Making  Medically screening exam initiated at 5:27 PM.  Appropriate orders placed.  Shari [name] was informed that the remainder of the evaluation will be completed by another provider, this initial triage assessment does not replace that evaluation, and the importance of remaining in the ED until their evaluation is complete.  Screening labs/imaging/zofran ordered    Jeanell Sparrow, DO 03/06/23 1727

## 2023-03-06 NOTE — ED Provider Notes (Signed)
[facility] Provider Note   CSN: [id number] Arrival date & time: 03/06/23  1651     History {Add pertinent medical, surgical, social history, OB history to HPI:1} Chief Complaint  Patient presents with   Emesis   Dizziness    Shari [name] is a 67 y.o. female.  The history is provided by the patient and medical records.  Emesis Dizziness Associated symptoms: vomiting   Shari [name] is a 67 y.o. female who presents to the Emergency Department complaining of *** Dizzy/vomiting about 5 minutes after eating half a chicken sandwhich at the [facility].   Had a headache earlier - now gone.   No fever. No weakness/numbness.   Hx/o hypothyroid, hypertension.  No blood thinners. No prior similar sxs. No dysuria.       Home Medications Prior to Admission medications   Medication Sig Start Date End Date Taking? Authorizing Provider  atenolol (TENORMIN) 25 MG tablet Take 0.5 tablets (12.5 mg total) by mouth daily. 11/03/22   Curatolo, Adam, DO  atorvastatin (LIPITOR) 40 MG tablet Take 1 tablet (40 mg total) by mouth at bedtime. 11/03/22 12/03/22  Curatolo, Adam, DO  divalproex (DEPAKOTE) 500 MG DR tablet Take 1 tablet (500 mg total) by mouth at bedtime. 11/03/22 02/01/23  Curatolo, Adam, DO  ibuprofen (ADVIL) 800 MG tablet Take 800 mg by mouth every 8 (eight) hours as needed for headache or moderate pain.    [provider]  lisinopril (ZESTRIL) 10 MG tablet Take 1 tablet (10 mg total) by mouth every evening. 11/03/22 12/03/22  Curatolo, Adam, DO  methimazole (TAPAZOLE) 10 MG tablet Take 1 tablet (10 mg total) by mouth daily. 01/18/22 02/17/22  Parks Ranger, DO  OLANZapine (ZYPREXA) 10 MG tablet Take 1 tablet (10 mg total) by mouth at bedtime. 11/03/22   Curatolo, Adam, DO  pantoprazole (PROTONIX) 40 MG tablet Take 1 tablet (40 mg total) by mouth 2 (two) times daily before a meal. 11/03/22 12/03/22  Curatolo, Adam, DO  potassium  chloride SA (KLOR-CON M) 20 MEQ tablet Take 1 tablet (20 mEq total) by mouth 2 (two) times daily. 0000000   Delora Fuel, MD  traZODone (DESYREL) 50 MG tablet Take 1 tablet (50 mg total) by mouth at bedtime as needed for sleep. 11/03/22 02/01/23  Lennice Sites, DO      Allergies    Aspirin, Chocolate, Cocoa, Penicillins, and Sulfa antibiotics    Review of Systems   Review of Systems  Gastrointestinal:  Positive for vomiting.  Neurological:  Positive for dizziness.  All other systems reviewed and are negative.   Physical Exam Updated Vital Signs BP (!) 116/58 (BP Location: Right Arm)   Pulse 81   Temp 98.3 F (36.8 C) (Oral)   Resp 16   Ht 5\' 4"  (1.626 m)   Wt 55.8 kg   SpO2 100%   BMI 21.11 kg/m  Physical Exam Vitals and nursing note reviewed.  Constitutional:      Appearance: [name] well-developed.  HENT:     Head: Normocephalic and atraumatic.  Cardiovascular:     Rate and Rhythm: Normal rate and regular rhythm.     Heart sounds: No murmur heard. Pulmonary:     Effort: Pulmonary effort is normal. No respiratory distress.     Breath sounds: Normal breath sounds.  Abdominal:     Palpations: Abdomen is soft.     Tenderness: There is no abdominal tenderness. There is no guarding or rebound.  Musculoskeletal:        General: No tenderness.  Skin:    General: Skin is warm and dry.  Neurological:     Mental Status: [name] alert and oriented to person, place, and time.     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 Visual fields grossly intact.  No asymmetry of facial movement.  5/5 strength in all four extremities with sensation to light touch intact in all four extremities.  No ataxia on FTN bilaterally  Psychiatric:        Behavior: Behavior normal.     ED Results / Procedures / Treatments   Labs (all labs ordered are listed, but only abnormal results are displayed) Labs Reviewed  CBC WITH DIFFERENTIAL/PLATELET - Abnormal; Notable for the  following components:      Result Value   WBC 10.7 (*)    Neutro Abs 8.9 (*)    All other components within normal limits  COMPREHENSIVE METABOLIC PANEL - Abnormal; Notable for the following components:   Glucose, Bld 110 (*)    Creatinine, Ser 1.16 (*)    Alkaline Phosphatase 160 (*)    GFR, Estimated 52 (*)    All other components within normal limits  LIPASE, BLOOD  URINALYSIS, ROUTINE W REFLEX MICROSCOPIC    EKG EKG Interpretation  Date/Time:  Monday March 06 2023 17:09:03 EDT Ventricular Rate:  106 PR Interval:  116 QRS Duration: 74 QT Interval:  348 QTC Calculation: 462 R Axis:   66 Text Interpretation: Sinus tachycardia Minimal voltage criteria for LVH, may be normal variant ( Sokolow-Lyon ) Nonspecific T wave abnormality Abnormal ECG Confirmed by Quintella Reichert [phone]) on 03/06/2023 11:05:54 PM  Radiology No results found.  Procedures Procedures  {Document cardiac monitor, telemetry assessment procedure when appropriate:1}  Medications Ordered in ED Medications  ondansetron (ZOFRAN) injection 4 mg (has no administration in time range)    ED Course/ Medical Decision Making/ A&P   {   Click here for ABCD2, HEART and other calculatorsREFRESH Note before signing :1}                          Medical Decision Making  ***  {Document critical care time when appropriate:1} {Document review of labs and clinical decision tools ie heart score, Chads2Vasc2 etc:1}  {Document your independent review of radiology images, and any outside records:1} {Document your discussion with family members, caretakers, and with consultants:1} {Document social determinants of health affecting pt's care:1} {Document your decision making why or why not admission, treatments were needed:1} Final Clinical Impression(s) / ED Diagnoses Final diagnoses:  None    Rx / DC Orders ED Discharge Orders     None

## 2023-03-07 ENCOUNTER — Emergency Department (HOSPITAL_COMMUNITY): Payer: 59

## 2023-03-07 DIAGNOSIS — R112 Nausea with vomiting, unspecified: Secondary | ICD-10-CM | POA: Diagnosis not present

## 2023-03-07 DIAGNOSIS — N281 Cyst of kidney, acquired: Secondary | ICD-10-CM | POA: Diagnosis not present

## 2023-03-07 DIAGNOSIS — R29818 Other symptoms and signs involving the nervous system: Secondary | ICD-10-CM | POA: Diagnosis not present

## 2023-03-07 DIAGNOSIS — R519 Headache, unspecified: Secondary | ICD-10-CM | POA: Diagnosis not present

## 2023-03-07 MED ORDER — IOHEXOL 350 MG/ML SOLN
75.0000 mL | Freq: Once | INTRAVENOUS | Status: AC | PRN
  Administered 2023-03-07: 75 mL via INTRAVENOUS

## 2023-03-07 MED ORDER — ONDANSETRON HCL 4 MG PO TABS: 4.0000 mg | ORAL_TABLET | Freq: Three times a day (TID) | ORAL | 0 refills | Status: DC | PRN

## 2023-03-07 NOTE — ED Notes (Signed)
Pt able to tolerate PO and ambulates without difficulty.

## 2023-03-10 ENCOUNTER — Telehealth: Payer: Self-pay

## 2023-03-10 NOTE — Telephone Encounter (Signed)
        Patient  visited [place] on 4/2     Telephone encounter attempt :  1st  A HIPAA compliant voice message was left requesting a return call.  Instructed patient to call back .   Lenard Forth [facility] Guide, [facility] [phone] [address] [place], [place], [place] [address] Phone: [phone] Email: [name]

## 2023-03-10 NOTE — Telephone Encounter (Signed)
        Patient  visited [place] on 4/2   Telephone encounter attempt : 2nd  A HIPAA compliant voice message was left requesting a return call.  Instructed patient to call back .    Lenard Forth [facility] Guide, [facility] [phone] [address] [place], [place], [place] [address] Phone: [phone] Email: [name]

## 2023-03-27 DIAGNOSIS — Z1211 Encounter for screening for malignant neoplasm of colon: Secondary | ICD-10-CM | POA: Diagnosis not present

## 2023-03-27 DIAGNOSIS — K635 Polyp of colon: Secondary | ICD-10-CM | POA: Diagnosis not present

## 2024-03-13 ENCOUNTER — Observation Stay (HOSPITAL_COMMUNITY)
Admission: EM | Admit: 2024-03-13 | Discharge: 2024-03-16 | Disposition: A | Discharge status: Home / Self Care | Attending: Emergency Medicine | Admitting: Emergency Medicine

## 2024-03-13 ENCOUNTER — Encounter (HOSPITAL_COMMUNITY): Payer: Self-pay | Admitting: Internal Medicine

## 2024-03-13 ENCOUNTER — Observation Stay (HOSPITAL_COMMUNITY)

## 2024-03-13 ENCOUNTER — Emergency Department (HOSPITAL_COMMUNITY)

## 2024-03-13 ENCOUNTER — Other Ambulatory Visit: Payer: Self-pay

## 2024-03-13 DIAGNOSIS — Z79899 Other long term (current) drug therapy: Secondary | ICD-10-CM | POA: Diagnosis not present

## 2024-03-13 DIAGNOSIS — E785 Hyperlipidemia, unspecified: Secondary | ICD-10-CM

## 2024-03-13 DIAGNOSIS — R509 Fever, unspecified: Secondary | ICD-10-CM | POA: Diagnosis not present

## 2024-03-13 DIAGNOSIS — E0590 Thyrotoxicosis, unspecified without thyrotoxic crisis or storm: Secondary | ICD-10-CM

## 2024-03-13 DIAGNOSIS — R931 Abnormal findings on diagnostic imaging of heart and coronary circulation: Secondary | ICD-10-CM

## 2024-03-13 DIAGNOSIS — R0602 Shortness of breath: Secondary | ICD-10-CM

## 2024-03-13 DIAGNOSIS — R079 Chest pain, unspecified: Principal | ICD-10-CM

## 2024-03-13 DIAGNOSIS — I480 Paroxysmal atrial fibrillation: Secondary | ICD-10-CM | POA: Insufficient documentation

## 2024-03-13 DIAGNOSIS — R000 Tachycardia, unspecified: Secondary | ICD-10-CM | POA: Insufficient documentation

## 2024-03-13 DIAGNOSIS — F259 Schizoaffective disorder, unspecified: Secondary | ICD-10-CM

## 2024-03-13 DIAGNOSIS — I491 Atrial premature depolarization: Secondary | ICD-10-CM | POA: Diagnosis not present

## 2024-03-13 DIAGNOSIS — Z7982 Long term (current) use of aspirin: Secondary | ICD-10-CM | POA: Insufficient documentation

## 2024-03-13 DIAGNOSIS — R2681 Unsteadiness on feet: Secondary | ICD-10-CM | POA: Diagnosis not present

## 2024-03-13 DIAGNOSIS — E876 Hypokalemia: Secondary | ICD-10-CM | POA: Diagnosis not present

## 2024-03-13 DIAGNOSIS — F17210 Nicotine dependence, cigarettes, uncomplicated: Secondary | ICD-10-CM | POA: Diagnosis not present

## 2024-03-13 DIAGNOSIS — I10 Essential (primary) hypertension: Secondary | ICD-10-CM | POA: Diagnosis not present

## 2024-03-13 DIAGNOSIS — E119 Type 2 diabetes mellitus without complications: Secondary | ICD-10-CM | POA: Insufficient documentation

## 2024-03-13 DIAGNOSIS — E0500 Thyrotoxicosis with diffuse goiter without thyrotoxic crisis or storm: Secondary | ICD-10-CM

## 2024-03-13 LAB — CBC
HCT: 35.2 % — ABNORMAL LOW (ref 36.0–46.0)
Hemoglobin: 11.1 g/dL — ABNORMAL LOW (ref 12.0–15.0)
MCH: 25.8 pg — ABNORMAL LOW (ref 26.0–34.0)
MCHC: 31.5 g/dL (ref 30.0–36.0)
MCV: 81.9 fL (ref 80.0–100.0)
Platelets: 251 10*3/uL (ref 150–400)
RBC: 4.3 MIL/uL (ref 3.87–5.11)
RDW: 13.1 % (ref 11.5–15.5)
WBC: 5.4 10*3/uL (ref 4.0–10.5)
nRBC: 0 % (ref 0.0–0.2)

## 2024-03-13 LAB — LIPID PANEL
Cholesterol: 133 mg/dL (ref 0–200)
HDL: 46 mg/dL (ref >40)
LDL Cholesterol: 73 mg/dL (ref 0–99)
Total CHOL/HDL Ratio: 2.9 ratio
Triglycerides: 68 mg/dL (ref <150)
VLDL: 14 mg/dL (ref 0–40)

## 2024-03-13 LAB — BASIC METABOLIC PANEL WITH GFR
Anion gap: 13 (ref 5–15)
Anion gap: 10 (ref 5–15)
BUN: 7 mg/dL — ABNORMAL LOW (ref 8–23)
BUN: 6 mg/dL — ABNORMAL LOW (ref 8–23)
CO2: 20 mmol/L — ABNORMAL LOW (ref 22–32)
CO2: 21 mmol/L — ABNORMAL LOW (ref 22–32)
Calcium: 9.7 mg/dL (ref 8.9–10.3)
Calcium: 9.5 mg/dL (ref 8.9–10.3)
Chloride: 106 mmol/L (ref 98–111)
Chloride: 110 mmol/L (ref 98–111)
Creatinine, Ser: 0.56 mg/dL (ref 0.44–1.00)
Creatinine, Ser: 0.44 mg/dL (ref 0.44–1.00)
GFR, Estimated: >60 mL/min (ref >60)
GFR, Estimated: >60 mL/min (ref >60)
Glucose, Bld: 123 mg/dL — ABNORMAL HIGH (ref 70–99)
Glucose, Bld: 93 mg/dL (ref 70–99)
Potassium: 2.7 mmol/L — CL (ref 3.5–5.1)
Potassium: 4.1 mmol/L (ref 3.5–5.1)
Sodium: 139 mmol/L (ref 135–145)
Sodium: 141 mmol/L (ref 135–145)

## 2024-03-13 LAB — RESP PANEL BY RT-PCR (RSV, FLU A&B, COVID)  RVPGX2
Influenza A by PCR: NEGATIVE
Influenza B by PCR: NEGATIVE
Resp Syncytial Virus by PCR: NEGATIVE
SARS Coronavirus 2 by RT PCR: NEGATIVE

## 2024-03-13 LAB — URINALYSIS, ROUTINE W REFLEX MICROSCOPIC
Bilirubin Urine: NEGATIVE
Glucose, UA: NEGATIVE mg/dL
Ketones, ur: NEGATIVE mg/dL
Nitrite: NEGATIVE
Protein, ur: NEGATIVE mg/dL
Specific Gravity, Urine: 1.014 (ref 1.005–1.030)
pH: 5 (ref 5.0–8.0)

## 2024-03-13 LAB — TROPONIN I (HIGH SENSITIVITY)
Troponin I (High Sensitivity): 8 ng/L (ref <18)
Troponin I (High Sensitivity): 7 ng/L (ref <18)

## 2024-03-13 LAB — NA AND K (SODIUM & POTASSIUM), RAND UR
Potassium Urine: 28 mmol/L
Sodium, Ur: 144 mmol/L

## 2024-03-13 LAB — ECHOCARDIOGRAM COMPLETE
Calc EF: 48 %
Height: 64 in
S' Lateral: 3.6 cm
Single Plane A2C EF: 46.8 %
Single Plane A4C EF: 49.7 %
Weight: 1600 [oz_av]

## 2024-03-13 LAB — MAGNESIUM
Magnesium: 1.3 mg/dL — ABNORMAL LOW (ref 1.7–2.4)
Magnesium: 3.2 mg/dL — ABNORMAL HIGH (ref 1.7–2.4)

## 2024-03-13 LAB — D-DIMER, QUANTITATIVE: D-Dimer, Quant: 0.3 ug{FEU}/mL (ref 0.00–0.50)

## 2024-03-13 LAB — BRAIN NATRIURETIC PEPTIDE: B Natriuretic Peptide: 111 pg/mL — ABNORMAL HIGH (ref 0.0–100.0)

## 2024-03-13 LAB — TSH: TSH: <0.01 u[IU]/mL — ABNORMAL LOW (ref 0.350–4.500)

## 2024-03-13 LAB — HIV ANTIBODY (ROUTINE TESTING W REFLEX): HIV Screen 4th Generation wRfx: NONREACTIVE

## 2024-03-13 LAB — T4, FREE: Free T4: 2.12 ng/dL — ABNORMAL HIGH (ref 0.61–1.12)

## 2024-03-13 MED ORDER — POLYETHYLENE GLYCOL 3350 17 G PO PACK: 17.0000 g | PACK | Freq: Every day | ORAL | Status: DC | PRN

## 2024-03-13 MED ORDER — POTASSIUM CHLORIDE CRYS ER 20 MEQ PO TBCR
60.0000 meq | EXTENDED_RELEASE_TABLET | Freq: Once | ORAL | Status: AC
  Administered 2024-03-13: 60 meq via ORAL
  Filled 2024-03-13: qty 3

## 2024-03-13 MED ORDER — ENOXAPARIN SODIUM 30 MG/0.3ML IJ SOSY
30.0000 mg | PREFILLED_SYRINGE | Freq: Every day | INTRAMUSCULAR | Status: DC
  Administered 2024-03-13 – 2024-03-16 (×4): 30 mg via SUBCUTANEOUS
  Filled 2024-03-13 (×4): qty 0.3

## 2024-03-13 MED ORDER — DIVALPROEX SODIUM 500 MG PO DR TAB
500.0000 mg | DELAYED_RELEASE_TABLET | Freq: Every day | ORAL | Status: DC
Start: 2024-03-13 — End: 2024-03-16
  Administered 2024-03-13 – 2024-03-15 (×3): 500 mg via ORAL
  Filled 2024-03-13 (×3): qty 1

## 2024-03-13 MED ORDER — POTASSIUM CHLORIDE 10 MEQ/100ML IV SOLN
10.0000 meq | Freq: Once | INTRAVENOUS | Status: AC
  Administered 2024-03-13: 10 meq via INTRAVENOUS
  Filled 2024-03-13: qty 100

## 2024-03-13 MED ORDER — METHIMAZOLE 10 MG PO TABS
10.0000 mg | ORAL_TABLET | Freq: Every day | ORAL | Status: DC
  Administered 2024-03-13 – 2024-03-16 (×4): 10 mg via ORAL
  Filled 2024-03-13 (×4): qty 1

## 2024-03-13 MED ORDER — SODIUM CHLORIDE 0.9% FLUSH
3.0000 mL | Freq: Two times a day (BID) | INTRAVENOUS | Status: DC
  Administered 2024-03-13 – 2024-03-15 (×5): 3 mL via INTRAVENOUS

## 2024-03-13 MED ORDER — ACETAMINOPHEN 325 MG PO TABS
650.0000 mg | ORAL_TABLET | Freq: Four times a day (QID) | ORAL | Status: DC | PRN
  Administered 2024-03-13 – 2024-03-15 (×3): 650 mg via ORAL
  Filled 2024-03-13 (×3): qty 2

## 2024-03-13 MED ORDER — ATENOLOL 25 MG PO TABS
12.5000 mg | ORAL_TABLET | Freq: Every day | ORAL | Status: DC
  Administered 2024-03-13 – 2024-03-14 (×2): 12.5 mg via ORAL
  Filled 2024-03-13: qty 0.5
  Filled 2024-03-13: qty 1
  Filled 2024-03-13: qty 0.5

## 2024-03-13 MED ORDER — POTASSIUM CHLORIDE CRYS ER 20 MEQ PO TBCR
40.0000 meq | EXTENDED_RELEASE_TABLET | Freq: Two times a day (BID) | ORAL | Status: AC
  Administered 2024-03-13 (×2): 40 meq via ORAL
  Filled 2024-03-13 (×2): qty 2

## 2024-03-13 MED ORDER — OLANZAPINE 10 MG PO TABS
10.0000 mg | ORAL_TABLET | Freq: Every day | ORAL | Status: DC
  Administered 2024-03-13 – 2024-03-15 (×3): 10 mg via ORAL
  Filled 2024-03-13 (×3): qty 1

## 2024-03-13 MED ORDER — POTASSIUM CHLORIDE 2 MEQ/ML IV SOLN
INTRAVENOUS | Status: DC
  Filled 2024-03-13: qty 1000

## 2024-03-13 MED ORDER — MAGNESIUM SULFATE 4 GM/100ML IV SOLN
4.0000 g | Freq: Once | INTRAVENOUS | Status: AC
  Administered 2024-03-13: 4 g via INTRAVENOUS
  Filled 2024-03-13: qty 100

## 2024-03-13 MED ORDER — MELATONIN 5 MG PO TABS: 5.0000 mg | ORAL_TABLET | Freq: Every evening | ORAL | Status: DC | PRN

## 2024-03-13 MED ORDER — MAGNESIUM SULFATE 2 GM/50ML IV SOLN
2.0000 g | Freq: Once | INTRAVENOUS | Status: AC
  Administered 2024-03-13: 2 g via INTRAVENOUS
  Filled 2024-03-13: qty 50

## 2024-03-13 MED ORDER — PROCHLORPERAZINE EDISYLATE 10 MG/2ML IJ SOLN: 5.0000 mg | Freq: Four times a day (QID) | INTRAMUSCULAR | Status: DC | PRN

## 2024-03-13 NOTE — Consult Note (Signed)
  Psych consult received for patient Medication clarification and adjustments, homeless lady. Psych provider attempted to assess this patient x 2. She has been off the unit most of the morning and afternoon. Will attempt to reassess tomorrow. Ordering provider has been notified.

## 2024-03-13 NOTE — Plan of Care (Signed)

## 2024-03-13 NOTE — H&P (Addendum)
 History and Physical    Patient: Shari [name] UEA:[id number] DOB: 02-28-1956 DOA: 03/13/2024 DOS: the patient was seen and examined on 03/13/2024 PCP: Jamey Reas, PA-C  Patient coming from: Homeless  Chief Complaint:  Chief Complaint  Patient presents with   Shortness of Breath   Chest Pain   HPI: Shari [name] is a 68 y.o. female with medical history significant of homelessness, Graves' disease/hyperthyroidism, schizoaffective disorder, postmenopausal bleeding and associated enlargement, paroxysmal atrial fibrillation not on chronic anticoagulation, GERD, dyslipidemia and hypertension.  Due to her homeless state lack of resources patient has been unable to obtain any of her prescribed medications including her Tapazole and beta-blocker for her Luiz Blare' disease.  She presented to to the ED from [facility] complaining of shortness of breath and substernal chest pain that did not radiate.  At rest she was minimally tachycardic with a heart rate of 110, mildly hypertensive with a BP of 149/81 and febrile with a temperature of 101.8 degrees.  She was not hypoxic.  Her chest x-ray was normal.  Initial EKG demonstrated atrial fibrillation with ventricular response 141.  She has subsequently converted spontaneously to normal sinus rhythm and has frequent PACs.  Found to have moderate hypokalemia with potassium of 2.7 and hypomagnesemia with a magnesium of 1.3.  She has been given IV and oral replacement of these electrolytes by the EDP.  Hospitalist service was asked to see the patient to evaluate for admission.  Upon my evaluation of the patient she confirmed that she has been without her medications for greater than 3 months and possibly longer.  She has no awareness of her tachycardia.  She has not had any UTI type symptoms.  She has not had any sick contacts.  She has not had any runny nose, she states her cough is chronic.  She has not had any nausea vomiting or abdominal pain.  She reports her weight  fluctuates.   Review of Systems: As mentioned in the history of present illness. All other systems reviewed and are negative. Past Medical History:  Diagnosis Date   Eye globe prosthesis    GERD (gastroesophageal reflux disease)    History of blood transfusion 1973   "related to abscess burst in my stomach"   Hyperlipemia    Hyperlipidemia    Hypertension    Osteoarthritis of back    Lowerback    SVD (spontaneous vaginal delivery)    x 1, baby died at 2 wks of age   Type II diabetes mellitus (HCC)    Past Surgical History:  Procedure Laterality Date   APPENDECTOMY     COLONOSCOPY     DILATATION & CURETTAGE/HYSTEROSCOPY WITH MYOSURE N/A 02/25/2015   Procedure: DILATATION & CURETTAGE/HYSTEROSCOPY WITH MYOSURE;  Surgeon: [name], MD;  Location: [facility];  Service: Gynecology;  Laterality: N/A;   DILATION AND CURETTAGE OF UTERUS     ENUCLEATION Right 03/16/2017   ENUCLEATION Right 03/16/2017   Procedure: ENUCLEATION RIGHT EYE;  Surgeon: [name], MD;  Location: [facility] OR;  Service: Ophthalmology;  Laterality: Right;   EYE SURGERY     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6, 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 She has a 20 pack-year smoking history. She has never used smokeless tobacco. She reports current alcohol use. She reports that she does not use drugs.  Allergies  Allergen Reactions   Aspirin Hives and Itching   Chocolate Hives and Itching   Cocoa Itching and Rash   Penicillins Hives, Itching, Swelling and Other (See Comments)    Has patient had a PCN reaction causing IMMEDIATE RASH, FACIAL/TONGUE/THROAT SWELLING, SOB, OR LIGHTHEADEDNESS WITH HYPOTENSION:  #  #  #  YES  #  #  #  Has  patient had a PCN reaction causing severe rash involving mucus membranes or skin necrosis: No Has patient had a PCN reaction that required hospitalization No Has patient had a PCN reaction occurring within the last 10 years: No If all of the above answers are "NO", then may proceed with Cephalosporin use.    Sulfa Antibiotics Hives and Itching    Family History  Problem Relation Age of Onset   Diabetes Mother    CAD Mother    Lung cancer Father     Prior to Admission medications   Medication Sig Start Date End Date Taking? Authorizing Provider  atenolol (TENORMIN) 25 MG tablet Take 0.5 tablets (12.5 mg total) by mouth daily. 11/03/22   Curatolo, Adam, DO  atorvastatin (LIPITOR) 40 MG tablet Take 1 tablet (40 mg total) by mouth at bedtime. 11/03/22 12/03/22  Curatolo, Adam, DO  divalproex (DEPAKOTE) 500 MG DR tablet Take 1 tablet (500 mg total) by mouth at bedtime. 11/03/22 02/01/23  Curatolo, Adam, DO  ibuprofen (ADVIL) 800 MG tablet Take 800 mg by mouth every 8 (eight) hours as needed for headache or moderate pain.    [provider]  lisinopril (ZESTRIL) 10 MG tablet Take 1 tablet (10 mg total) by mouth every evening. 11/03/22 12/03/22  Curatolo, Adam, DO  methimazole (TAPAZOLE) 10 MG tablet Take 1 tablet (10 mg total) by mouth daily. 01/18/22 02/17/22  Sarina Ill, DO  OLANZapine (ZYPREXA) 10 MG tablet Take 1 tablet (10 mg total) by mouth at bedtime. 11/03/22   Curatolo, Adam, DO  ondansetron (ZOFRAN) 4 MG tablet Take 1 tablet (4 mg total) by mouth every 8 (eight) hours as needed for nausea or vomiting. 03/07/23   Tilden Fossa, MD  pantoprazole (PROTONIX) 40 MG tablet Take 1 tablet (40 mg total) by mouth 2 (two) times daily before a meal. 11/03/22 12/03/22  Curatolo, Adam, DO  potassium chloride SA (KLOR-CON M) 20 MEQ tablet Take 1 tablet (20 mEq total) by mouth 2 (two) times daily. 06/16/22   Dione Booze, MD  traZODone (DESYREL) 50 MG tablet Take 1 tablet (50 mg  total) by mouth at bedtime as needed for sleep. 11/03/22 02/01/23  Virgina Norfolk, DO    Physical Exam: Vitals:   03/13/24 0221 03/13/24 0230 03/13/24 0500 03/13/24 0534  BP: 126/71 127/70 108/69 117/65  Pulse: 80   81  Resp: 17 20 16 17   Temp:    98.5 F (36.9 C)  TempSrc:    Oral  SpO2: 100%   99%  Weight:      Height:       Constitutional: NAD, calm, appears very cachectic- notable BMI of 17 Eyes: PERRL, lids and conjunctivae normal, does not have any current features consistent with thyroid eye disease.  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  Neck: normal, supple, no masses, palpation I was unable to appreciate any thyromegaly (prior imaging with documented  cysts) Respiratory: clear to auscultation bilaterally, no wheezing, no crackles. Normal respiratory effort. No accessory muscle use.  Cardiovascular: Regular rate and rhythm, no murmurs / rubs / gallops. No extremity edema. 2+ pedal pulses.  Abdomen: no tenderness, no masses palpated. No hepatosplenomegaly. Bowel sounds positive.  Musculoskeletal: no clubbing / cyanosis. No joint deformity upper and lower extremities. Good ROM, no contractures. Normal muscle tone.  Skin: no rashes, lesions, ulcers. No induration Neurologic: CN 2-12 grossly intact. Sensation intact, DTR normal. Strength 5/5 x all 4 extremities.  Psychiatric: Normal judgment and insight. Alert and oriented x 3. Normal mood.    Data Reviewed:  Sodium 139, potassium 2.7, chloride 106, CO2 20, glucose 123, BUN 7, creatinine 0.56, anion gap 13, magnesium 1.3  BNP 111, troponin 8 and 7, D-dimer 0.3  WBC 5400, differential not obtained, hemoglobin 11.1, platelets 251,000  Chest x-ray without any acute process  EKG with atrial fibrillation ventricular response 141 bpm  Assessment and Plan: Fever/chest pain/shortness of breath Initial infectious workup unremarkable: Chest x-ray negative, subsequent PCR for COVID, flu and RSV negative.  Chest pain  appears atypical noting EKG unremarkable and troponins have been negative as well as D-dimer Urinalysis and urine culture have been obtained as well as blood cultures With her history, attending physician has recommended echocardiogram  Graves' disease/hyperthyroidism  Current TSH <0.010 with free T4 of 2.12 Which is consistent with severe hyperthyroidism Currently no signs of acute thyrotoxicosis Patient off Tapazole at least 3 months - resume Currently not tachycardic but did present with atrial fibrillation and RVR with a ventricular rate 141 while febrile therefore we will resume Tenormin 12.5 mg cautiously given current suboptimal blood pressure readings. Review of Care everywhere does not demonstrate any follow-up with an endocrinology physician Unclear if she is a candidate for ablation procedure Will obtain ultrasound thyroid to determine degree of cystic infiltration and/or thyromegaly not detected by external exam  Hypokalemia and hypomagnesemia Patient was supposed to be taking potassium supplementation prior to admission She did admit to beer ingestion at least once per week but was not specific in regards to how much Continue to follow electrolytes and replace as needed. Potassium has increased to 4.1 and magnesium has increased to 3.2 after replacement therapy in the ED  Schizoaffective disorder Per med rec she is supposed to be taking Zyprexa and Depakote Patient has previously been on trazodone Will need TOC assistance regarding access to medications prior to discharge According to her facesheet she has Medicare part A and B-need to confirm that current medications are covered.  Also she may have reached what is known as the donut hole and because of this Medicare was not covering her medications. Psych consult for medication adjustments/recommendations.  Hypertension Current blood pressure readings are suboptimal It appears she has not been on lisinopril since  2023  Dyslipidemia Appears patient has not taken her Lipitor since 2023 Check lipid panel  Tobacco abuse History 1-1 and half packs per day   Advance Care Planning:   Code Status: Full Code   VTE prophylaxis: Lovenox  Consults: Psychiatry  Family Communication: Patient only  Severity of Illness: The appropriate patient status for this patient is OBSERVATION. Observation status is judged to be reasonable and necessary in order to provide the required intensity of service to ensure the patient's safety. The patient's presenting symptoms, physical exam findings, and initial radiographic and laboratory data in the context of their medical condition is felt to place them at decreased risk for further clinical  deterioration. Furthermore, it is anticipated that the patient will be medically stable for discharge from the hospital within 2 midnights of admission.   Author: Junious Silk, NP 03/13/2024 7:29 AM  For on call review [email or web address].

## 2024-03-13 NOTE — ED Provider Notes (Signed)
 [place] EMERGENCY DEPARTMENT AT [facility] Provider Note   CSN: [id number] Arrival date & time: 03/13/24  0030     History  Chief Complaint  Patient presents with   Shortness of Breath   Chest Pain    Shari [name] is a 68 y.o. female with medical history significant for type 2 diabetes, hypertension, hyperlipidemia, GERD, Graves' disease, paroxysmal A-fib.  Patient presents to ED for evaluation of chest pain and shortness of breath.  She reports that her symptoms began 1 hour ago.  States that prior to this she was in her usual state of health.  She reports that the chest pain is located centrally, does not radiate.  She denies any exertional component to her chest pain or shortness of breath.  She denies lower extremity edema, nausea, vomiting, abdominal pain.  [name] endorsing lightheadedness on standing.  Reports history of A-fib, she is not anticoagulated.  She does not take rate control medication.  She reports the last time she had these medications was 8 months ago.  Denies any recent surgery or travel, history of DVT or PE, hormone use, unilateral leg swelling or hemoptysis.   Shortness of Breath Associated symptoms: chest pain   Chest Pain Associated symptoms: shortness of breath        Home Medications Prior to Admission medications   Medication Sig Start Date End Date Taking? Authorizing Provider  atenolol (TENORMIN) 25 MG tablet Take 0.5 tablets (12.5 mg total) by mouth daily. 11/03/22   Curatolo, Adam, DO  atorvastatin (LIPITOR) 40 MG tablet Take 1 tablet (40 mg total) by mouth at bedtime. 11/03/22 12/03/22  Curatolo, Adam, DO  divalproex (DEPAKOTE) 500 MG DR tablet Take 1 tablet (500 mg total) by mouth at bedtime. 11/03/22 02/01/23  Curatolo, Adam, DO  ibuprofen (ADVIL) 800 MG tablet Take 800 mg by mouth every 8 (eight) hours as needed for headache or moderate pain.    [provider]  lisinopril (ZESTRIL) 10 MG tablet Take 1 tablet (10 mg total)  by mouth every evening. 11/03/22 12/03/22  Curatolo, Adam, DO  methimazole (TAPAZOLE) 10 MG tablet Take 1 tablet (10 mg total) by mouth daily. 01/18/22 02/17/22  Sarina Ill, DO  OLANZapine (ZYPREXA) 10 MG tablet Take 1 tablet (10 mg total) by mouth at bedtime. 11/03/22   Curatolo, Adam, DO  ondansetron (ZOFRAN) 4 MG tablet Take 1 tablet (4 mg total) by mouth every 8 (eight) hours as needed for nausea or vomiting. 03/07/23   Tilden Fossa, MD  pantoprazole (PROTONIX) 40 MG tablet Take 1 tablet (40 mg total) by mouth 2 (two) times daily before a meal. 11/03/22 12/03/22  Curatolo, Adam, DO  potassium chloride SA (KLOR-CON M) 20 MEQ tablet Take 1 tablet (20 mEq total) by mouth 2 (two) times daily. 06/16/22   Dione Booze, MD  traZODone (DESYREL) 50 MG tablet Take 1 tablet (50 mg total) by mouth at bedtime as needed for sleep. 11/03/22 02/01/23  Virgina Norfolk, DO      Allergies    Aspirin, Chocolate, Cocoa, Penicillins, and Sulfa antibiotics    Review of Systems   Review of Systems  Respiratory:  Positive for shortness of breath.   Cardiovascular:  Positive for chest pain.  Neurological:  Positive for light-headedness.  All other systems reviewed and are negative.   Physical Exam Updated Vital Signs BP 127/70   Pulse 80   Temp 97.6 F (36.4 C) (Oral)   Resp 20   Ht 5\' 4"  (  1.626 m)   Wt 45.4 kg   SpO2 100%   BMI 17.16 kg/m  Physical Exam Vitals and nursing note reviewed.  Constitutional:      General: [name] not in acute distress.    Appearance: [name] well-developed.  HENT:     Head: Normocephalic and atraumatic.  Eyes:     Conjunctiva/sclera: Conjunctivae normal.  Cardiovascular:     Rate and Rhythm: Normal rate. Rhythm irregular.     Heart sounds: No murmur heard. Pulmonary:     Effort: Pulmonary effort is normal. No respiratory distress.     Breath sounds: Normal breath sounds.  Abdominal:     Palpations: Abdomen is soft.     Tenderness: There is no abdominal  tenderness.  Musculoskeletal:        General: No swelling.     Cervical back: Neck supple.     Right lower leg: No edema.     Left lower leg: No edema.  Skin:    General: Skin is warm and dry.     Capillary Refill: Capillary refill takes less than 2 seconds.  Neurological:     Mental Status: [name] alert and oriented to person, place, and time.  Psychiatric:        Mood and Affect: Mood normal.     ED Results / Procedures / Treatments   Labs (all labs ordered are listed, but only abnormal results are displayed) Labs Reviewed  BASIC METABOLIC PANEL WITH GFR - Abnormal; Notable for the following components:      Result Value   Potassium 2.7 (*)    CO2 20 (*)    Glucose, Bld 123 (*)    BUN 7 (*)    All other components within normal limits  CBC - Abnormal; Notable for the following components:   Hemoglobin 11.1 (*)    HCT 35.2 (*)    MCH 25.8 (*)    All other components within normal limits  BRAIN NATRIURETIC PEPTIDE - Abnormal; Notable for the following components:   B Natriuretic Peptide 111.0 (*)    All other components within normal limits  MAGNESIUM - Abnormal; Notable for the following components:   Magnesium 1.3 (*)    All other components within normal limits  D-DIMER, QUANTITATIVE  HIV ANTIBODY (ROUTINE TESTING W REFLEX)  NA AND K (SODIUM & POTASSIUM), RAND UR  TROPONIN I (HIGH SENSITIVITY)  TROPONIN I (HIGH SENSITIVITY)    EKG EKG Interpretation Date/Time:  Wednesday March 13 2024 00:42:30 EDT Ventricular Rate:  141 PR Interval:  141 QRS Duration:  83 QT Interval:  334 QTC Calculation: 512 R Axis:   64  Text Interpretation: Sinus tachycardia Multiple premature complexes, vent & supraven Sinus pause with ventricular escape Aberrant conduction of SV complex(es) Left atrial enlargement Abnormal R-wave progression, early transition Probable left ventricular hypertrophy Abnormal T, consider ischemia, diffuse leads Prolonged QT interval Confirmed by Marily Memos [phone]) on 03/13/2024 12:45:08 AM  Radiology DG Chest Port 1 View Result Date: 03/13/2024 CLINICAL DATA:  cp EXAM: PORTABLE CHEST 1 VIEW COMPARISON:  Chest x-ray 11/03/2022 FINDINGS: The heart and mediastinal contours are within normal limits. No focal consolidation. No pulmonary edema. No pleural effusion. No pneumothorax. No acute osseous abnormality. IMPRESSION: No active disease. Electronically Signed   By: Tish Frederickson M.D.   On: 03/13/2024 03:47    Procedures Procedures   Medications Ordered in ED Medications  enoxaparin (LOVENOX) injection 40 mg (has no administration in time range)  magnesium sulfate  IVPB 4 g 100 mL (has no administration in time range)  lactated ringers 1,000 mL with potassium chloride 40 mEq infusion (has no administration in time range)  potassium chloride SA (KLOR-CON M) CR tablet 40 mEq (has no administration in time range)  acetaminophen (TYLENOL) tablet 650 mg (has no administration in time range)  prochlorperazine (COMPAZINE) injection 5 mg (has no administration in time range)  polyethylene glycol (MIRALAX / GLYCOLAX) packet 17 g (has no administration in time range)  melatonin tablet 5 mg (has no administration in time range)  potassium chloride SA (KLOR-CON M) CR tablet 60 mEq (60 mEq Oral Given 03/13/24 0319)  potassium chloride 10 mEq in 100 mL IVPB (10 mEq Intravenous New Bag/Given 03/13/24 0323)  magnesium sulfate IVPB 2 g 50 mL (2 g Intravenous New Bag/Given 03/13/24 0454)    ED Course/ Medical Decision Making/ A&P  Medical Decision Making Amount and/or Complexity of Data Reviewed Labs: ordered. Radiology: ordered.  Risk Prescription drug management. Decision regarding hospitalization.   68 year old female presents to ED for evaluation of chest pain and shortness of breath.  Please see HPI for further details.  On examination the patient is afebrile, tachycardic and an irregular rhythm.  Her pulse rate transitions between 90 to 95 bpm.   Initially arrived tachycardic however this normalized without any intervention.  Her lung sounds are clear bilaterally, she is nonhypoxic on room air.  Her abdomen is soft and compressible with no tenderness.  Her neurological examination is at baseline without focal neurodeficits.  She has no edema to bilateral lower extremities.  Overall nontoxic in appearance.  Will assess patient with CBC, BMP, troponins, D-dimer, chest x-ray, EKG, magnesium, BNP.  Patient CBC without leukocytosis, baseline hemoglobin.  Patient magnesium decreased to 1.3.  BNP elevated 111.  D-dimer negative.  Metabolic panel shows potassium 2.7, glucose 123, BUN 7, anion gap 13.  Patient troponin 8, 7 with delta.  EKG is nonischemic.  Chest x-ray shows no acute abnormality.  Patient potassium repleted with 60 mEq oral potassium, 10 IV.  Patient was given 2 g magnesium sulfate for hypomagnesemia.  At this time, feel patient requires admission due to hypokalemia.  She reports that she is currently homeless, unable to afford medication.  She would theoretically be unable to afford her outpatient potassium.  Will admit patient.  Discussed with Dr. Margo Aye, [facility] hospitalist.  Dr. Margo Aye has agreed to admit patient.   Final Clinical Impression(s) / ED Diagnoses Final diagnoses:  Chest pain, unspecified type  Shortness of breath  Hypokalemia  Hypomagnesemia    Rx / DC Orders ED Discharge Orders     None         Al Decant, PA-C 03/13/24 0981    Marily Memos, MD 03/13/24 [phone]

## 2024-03-13 NOTE — Progress Notes (Signed)
 Echocardiogram 2D Echocardiogram has been performed.  Warren Lacy Tayna Smethurst RDCS 03/13/2024, 3:38 PM

## 2024-03-13 NOTE — ED Notes (Signed)
 Pt called out complaining of ongoing shoulder pain.

## 2024-03-13 NOTE — ED Triage Notes (Signed)
 Per EMS, pt from [facility], c/o SOB and substernal chest pain that does not radiate.  Pt has not had any med in months.  She has hx of afib but 5 lead shows a dysrhythmia.    Fever of 101.8 149/81 110hr IV L hand 20G Chronic cough CBG 103

## 2024-03-13 NOTE — ED Notes (Signed)
 CCMD notified patient placed on monitor.

## 2024-03-14 DIAGNOSIS — I480 Paroxysmal atrial fibrillation: Secondary | ICD-10-CM | POA: Diagnosis not present

## 2024-03-14 DIAGNOSIS — R931 Abnormal findings on diagnostic imaging of heart and coronary circulation: Secondary | ICD-10-CM

## 2024-03-14 DIAGNOSIS — R0602 Shortness of breath: Secondary | ICD-10-CM | POA: Diagnosis not present

## 2024-03-14 DIAGNOSIS — R079 Chest pain, unspecified: Secondary | ICD-10-CM | POA: Diagnosis not present

## 2024-03-14 DIAGNOSIS — R509 Fever, unspecified: Secondary | ICD-10-CM | POA: Diagnosis not present

## 2024-03-14 DIAGNOSIS — I491 Atrial premature depolarization: Secondary | ICD-10-CM | POA: Diagnosis not present

## 2024-03-14 DIAGNOSIS — I5020 Unspecified systolic (congestive) heart failure: Secondary | ICD-10-CM | POA: Diagnosis not present

## 2024-03-14 DIAGNOSIS — E876 Hypokalemia: Secondary | ICD-10-CM | POA: Diagnosis not present

## 2024-03-14 LAB — COMPREHENSIVE METABOLIC PANEL WITH GFR
ALT: 14 U/L (ref 0–44)
AST: 16 U/L (ref 15–41)
Albumin: 3 g/dL — ABNORMAL LOW (ref 3.5–5.0)
Alkaline Phosphatase: 115 U/L (ref 38–126)
Anion gap: 11 (ref 5–15)
BUN: 7 mg/dL — ABNORMAL LOW (ref 8–23)
CO2: 22 mmol/L (ref 22–32)
Calcium: 10.1 mg/dL (ref 8.9–10.3)
Chloride: 107 mmol/L (ref 98–111)
Creatinine, Ser: 0.65 mg/dL (ref 0.44–1.00)
GFR, Estimated: >60 mL/min (ref >60)
Glucose, Bld: 84 mg/dL (ref 70–99)
Potassium: 5 mmol/L (ref 3.5–5.1)
Sodium: 140 mmol/L (ref 135–145)
Total Bilirubin: 0.6 mg/dL (ref 0.0–1.2)
Total Protein: 6.3 g/dL — ABNORMAL LOW (ref 6.5–8.1)

## 2024-03-14 LAB — URINE CULTURE: Culture: 20000 — AB

## 2024-03-14 LAB — CBC
HCT: 35.2 % — ABNORMAL LOW (ref 36.0–46.0)
Hemoglobin: 11.1 g/dL — ABNORMAL LOW (ref 12.0–15.0)
MCH: 25.9 pg — ABNORMAL LOW (ref 26.0–34.0)
MCHC: 31.5 g/dL (ref 30.0–36.0)
MCV: 82.2 fL (ref 80.0–100.0)
Platelets: 262 10*3/uL (ref 150–400)
RBC: 4.28 MIL/uL (ref 3.87–5.11)
RDW: 13.2 % (ref 11.5–15.5)
WBC: 6.2 10*3/uL (ref 4.0–10.5)
nRBC: 0 % (ref 0.0–0.2)

## 2024-03-14 LAB — MAGNESIUM: Magnesium: 1.6 mg/dL — ABNORMAL LOW (ref 1.7–2.4)

## 2024-03-14 MED ORDER — LOSARTAN POTASSIUM 50 MG PO TABS
25.0000 mg | ORAL_TABLET | Freq: Every day | ORAL | Status: DC
  Administered 2024-03-15 – 2024-03-16 (×2): 25 mg via ORAL
  Filled 2024-03-14 (×2): qty 1

## 2024-03-14 MED ORDER — MAGNESIUM SULFATE 4 GM/100ML IV SOLN
4.0000 g | Freq: Once | INTRAVENOUS | Status: AC
  Administered 2024-03-14: 4 g via INTRAVENOUS
  Filled 2024-03-14: qty 100

## 2024-03-14 NOTE — Evaluation (Signed)
 Physical Therapy Evaluation and Discharge  Patient Details Name: Shari [name] MRN: [id number] DOB: 02/07/1956 Today's Date: 03/14/2024  History of Present Illness  Pt is a 68 y/o F presenting to ED on 4/9 from [facility] with substernal chest pain, CXR engative, admitted for hypokalemia. PMH includes DM2, HTN, HLD, GERD, Graves disease, paroxysmal A fib, schizoaffective disorder  Clinical Impression  Patient is independent at baseline with mobility. Today she reports feeling better overall. [name] independent with transfers and has been getting up in the room without assistance. Hallway ambulation performed with no dyspnea, chest pain, or loss of balance. Steady with turns, navigation around obstacles, reaching outside base of support. No dyspnea with exertion and Sp02 100% on room air after walking. No apparent acute PT needs at this time. PT will sign off.       If plan is discharge home, recommend the following: Assist for transportation   Can travel by private vehicle        Equipment Recommendations None recommended by PT  Recommendations for Other Services       Functional Status Assessment Patient has not had a recent decline in their functional status     Precautions / Restrictions Precautions Precautions: None Restrictions Weight Bearing Restrictions Per Provider Order: No      Mobility  Bed Mobility               General bed mobility comments: not tested    Transfers Overall transfer level: Independent                      Ambulation/Gait Ambulation/Gait assistance: Modified independent (Device/Increase time) (increased time) Gait Distance (Feet): 150 Feet Assistive device: None Gait Pattern/deviations: Step-through pattern Gait velocity: decreased     General Gait Details: no loss of balance or shortness of breath with activity. stable with turns, stops, navigation around obstacles in hallway  Stairs            Wheelchair Mobility      Tilt Bed    Modified Rankin (Stroke Patients Only)       Balance Overall balance assessment: No apparent balance deficits (not formally assessed)                                           Pertinent Vitals/Pain Pain Assessment Pain Assessment: No/denies pain    Home Living Family/patient expects to be discharged to:: Shelter/Homeless                   Additional Comments: goes to [facility] daily for bathing, meals, sleeping    Prior Function Prior Level of Function : Independent/Modified Independent             Mobility Comments: independent ADLs Comments: independent     Extremity/Trunk Assessment   Upper Extremity Assessment Upper Extremity Assessment: Defer to OT evaluation    Lower Extremity Assessment Lower Extremity Assessment: RLE deficits/detail;LLE deficits/detail RLE Deficits / Details: 4+/5 knee extension. 5/5 dorsiflexion/plantarflexion LLE Deficits / Details: 4+/5 knee extension. 5/5 dorsiflexion/plantarflexion    Cervical / Trunk Assessment Cervical / Trunk Assessment: Normal  Communication   Communication Communication: No apparent difficulties    Cognition Arousal: Alert Behavior During Therapy: Flat affect   PT - Cognitive impairments: No apparent impairments  Following commands: Intact       Cueing Cueing Techniques: Gestural cues, Verbal cues     General Comments General comments (skin integrity, edema, etc.): heart rate in the 80's and 90's with walking. no dyspnea with exertion. Sp02 100% on room air    Exercises     Assessment/Plan    PT Assessment Patient does not need any further PT services  PT Problem List         PT Treatment Interventions      PT Goals (Current goals can be found in the Care Plan section)  Acute Rehab PT Goals PT Goal Formulation: All assessment and education complete, DC therapy    Frequency       Co-evaluation                AM-PAC PT "6 Clicks" Mobility  Outcome Measure Help needed turning from your back to your side while in a flat bed without using bedrails?: None Help needed moving from lying on your back to sitting on the side of a flat bed without using bedrails?: None Help needed moving to and from a bed to a chair (including a wheelchair)?: None Help needed standing up from a chair using your arms (e.g., wheelchair or bedside chair)?: None Help needed to walk in hospital room?: None Help needed climbing 3-5 steps with a railing? : None 6 Click Score: 24    End of Session   Activity Tolerance: Patient tolerated treatment well Patient left: in bed;with call bell/phone within reach   PT Visit Diagnosis: Muscle weakness (generalized) (M62.81)    Time: 1308-6578 PT Time Calculation (min) (ACUTE ONLY): 12 min   Charges:   PT Evaluation $PT Eval Low Complexity: 1 Low   PT General Charges $$ ACUTE PT VISIT: 1 Visit        Shari [name], PT, MPT   Shari [name] 03/14/2024, 2:01 PM

## 2024-03-14 NOTE — Plan of Care (Signed)

## 2024-03-14 NOTE — Care Management Obs Status (Signed)
 MEDICARE OBSERVATION STATUS NOTIFICATION   Patient Details  Name: Shari [name] MRN: [id number] Date of Birth: [birth year]   Medicare Observation Status Notification Given:  Yes  Moon/Obs letter signed and given  Dorena Bodo 03/14/2024, 9:29 AM

## 2024-03-14 NOTE — Consult Note (Signed)
 Cardiology Consultation   Patient ID: BRITTENY FIEBELKORN MRN: [id number]; DOB: August 17, 1956  Admit date: 03/13/2024 Date of Consult: 03/14/2024  PCP:  Jamey Reas, PA-C   [facility] Providers Cardiologist:  None        Patient Profile:   Shari [name] is a 68 y.o. female with a hx of Graves' disease, schizoaffective disorder, postmenopausal bleeding, paroxysmal atrial fibrillation (not on anticoagulation), hypertension, hyperlipidemia, GERD, tobacco use who is being seen 03/14/2024 for the evaluation of abnormal echocardiogram at the request of Dr. Janee Morn.  History of Present Illness:   Shari [name] presented to the emergency department on/9 with symptoms of shortness of breath and substernal chest pain.  Upon triage patient found to be tachycardic with heart rate of 110 and moderately hypertensive with blood pressure 140/81.  Patient also febrile with temperature of 101.8 F.  Her EKG demonstrated tachycardic arrhythmia, heart rate of approximately 112 bpm.  While in the emergency department, appears patient has spontaneous improvement to normal sinus rhythm with PACs.  Initial labs notable for potassium of 2.7, magnesium of 1.3, hemoglobin of 11.1.  Troponin negative x 2.  BNP 111.  TSH less than 0.010 with T4 2.12.  Respiratory virus panel negative.  Chest x-ray without acute disease process.  Patient with significant social challenges at baseline, homelessness.  Has not had resources to obtain prescription medications for several months.  Admitting team ordered an echocardiogram which was completed on 4/9.  Patient found to have mild global hypokinesis with an LVEF of 45 to 50%.  With previous echocardiogram from 2020 showing normal left ventricular ejection fraction, cardiology consulted.  Appears that 2020 echocardiogram was obtained in the setting of physical exam notable for murmur at that time.  By my personal review of previous medical records, does not appear the patient  has ever been put on oral anticoagulation. Apparently afib was noted by EMS back in 2020, possibly seen intermittently in the ED at [facility]. Never confirmed by EKG.   On exam today, patient reports chronic exertional dyspnea over the past several months. Denies orthopnea or LE edema. She confirms chest tightness on 4/8 prompting ED visit. Says that nothing seemed to make this pain better or worse, though since admission has not recurred.   Past Medical History:  Diagnosis Date   Eye globe prosthesis    GERD (gastroesophageal reflux disease)    History of blood transfusion 1973   "related to abscess burst in my stomach"   Hyperlipemia    Hyperlipidemia    Hypertension    Osteoarthritis of back    Lowerback    SVD (spontaneous vaginal delivery)    x 1, baby died at 2 wks of age   Type II diabetes mellitus (HCC)     Past Surgical History:  Procedure Laterality Date   APPENDECTOMY     COLONOSCOPY     DILATATION & CURETTAGE/HYSTEROSCOPY WITH MYOSURE N/A 02/25/2015   Procedure: DILATATION & CURETTAGE/HYSTEROSCOPY WITH MYOSURE;  Surgeon: [name], MD;  Location: [facility];  Service: Gynecology;  Laterality: N/A;   DILATION AND CURETTAGE OF UTERUS     ENUCLEATION Right 03/16/2017   ENUCLEATION Right 03/16/2017   Procedure: ENUCLEATION RIGHT EYE;  Surgeon: [name], MD;  Location: [facility] OR;  Service: Ophthalmology;  Laterality: Right;   EYE SURGERY     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6, 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 Authorizing Provider  acetaminophen (TYLENOL) 500 MG tablet Take 1,000 mg by mouth every 6 (six) hours as needed for mild pain (pain  score 1-3) or moderate pain (pain score 4-6).   Yes [provider]  atenolol (TENORMIN) 25 MG tablet Take 0.5 tablets (12.5 mg total) by mouth daily. Patient not taking: Reported on 03/13/2024 11/03/22   Virgina Norfolk, DO  atorvastatin (LIPITOR) 40 MG tablet Take 1 tablet (40 mg total) by mouth at bedtime. Patient not taking: Reported on 03/13/2024 11/03/22 12/03/22  Virgina Norfolk, DO  divalproex (DEPAKOTE) 500 MG DR tablet Take 1 tablet (500 mg total) by mouth at bedtime. Patient not taking: Reported on 03/13/2024 11/03/22 02/01/23  Virgina Norfolk, DO  ibuprofen (ADVIL) 800 MG tablet Take 800 mg by mouth every 8 (eight) hours as needed for headache or moderate pain. Patient not taking: Reported on 03/13/2024    [provider]  lisinopril (ZESTRIL) 10 MG tablet Take 1 tablet (10 mg total) by mouth every evening. Patient not taking: Reported on 03/13/2024 11/03/22 12/03/22  Virgina Norfolk, DO  methimazole (TAPAZOLE) 10 MG tablet Take 1 tablet (10 mg total) by mouth daily. Patient not taking: Reported on 03/13/2024 01/18/22 02/17/22  Sarina Ill, DO  OLANZapine (ZYPREXA) 10 MG tablet Take 1 tablet (10 mg total) by mouth at bedtime. Patient not taking: Reported on 03/13/2024 11/03/22   Virgina Norfolk, DO  ondansetron (ZOFRAN) 4 MG tablet Take 1 tablet (4 mg total) by mouth every 8 (eight) hours as needed for nausea or vomiting. Patient not taking: Reported on 03/13/2024 03/07/23   Tilden Fossa, MD  pantoprazole (PROTONIX) 40 MG tablet Take 1 tablet (40 mg total) by mouth 2 (two) times daily before a meal. Patient not taking: Reported on 03/13/2024 11/03/22 12/03/22  Virgina Norfolk, DO  potassium chloride SA (KLOR-CON M) 20 MEQ tablet Take 1 tablet (20 mEq total) by mouth 2 (two) times daily. Patient not taking: Reported on 03/13/2024 06/16/22   Dione Booze, MD  traZODone (DESYREL) 50 MG tablet Take 1 tablet (50 mg total) by mouth at bedtime as needed for sleep. Patient not taking:  Reported on 03/13/2024 11/03/22 02/01/23  Virgina Norfolk, DO    Inpatient Medications: Scheduled Meds:  atenolol  12.5 mg Oral Daily   divalproex  500 mg Oral QHS   enoxaparin (LOVENOX) injection  30 mg Subcutaneous Daily   methimazole  10 mg Oral Daily   OLANZapine  10 mg Oral QHS   sodium chloride flush  3 mL Intravenous Q12H   Continuous Infusions:  PRN Meds: acetaminophen, melatonin, polyethylene glycol, prochlorperazine  Allergies:    Allergies  Allergen Reactions   Aspirin Hives and Itching   Chocolate Hives and Itching   Cocoa Itching and Rash   Penicillins Hives, Itching, Swelling and Other (See Comments)    Has patient had a PCN reaction causing IMMEDIATE RASH, FACIAL/TONGUE/THROAT SWELLING, SOB, OR LIGHTHEADEDNESS WITH HYPOTENSION:  #  #  #  YES  #  #  #  Has patient had a PCN reaction causing severe rash involving mucus membranes or skin necrosis: No Has patient had a PCN reaction that required hospitalization No Has patient  had a PCN reaction occurring within the last 10 years: No If all of the above answers are "NO", then may proceed with Cephalosporin use.    Sulfa Antibiotics Hives and Itching    Social History:   Social History   Socioeconomic History   Marital status: Widowed    Spouse name: Not on file   Number of children: Not on file   Years of education: Not on file   Highest education level: Not on file  Occupational History   Not on file  Tobacco Use   Smoking status: Every Day    Current packs/day: 0.50    Average packs/day: 0.5 packs/day for 40.0 years (20.0 ttl pk-yrs)    Types: Cigarettes   Smokeless tobacco: Never  Vaping Use   Vaping status: Never Used  Substance and Sexual Activity   Alcohol use: Yes    Comment: beer occasionally   Drug use: No   Sexual activity: Not Currently    Birth control/protection: Post-menopausal  Other Topics Concern   Not on file  Social History Narrative   Not on file   Social Drivers of Health    Financial Resource Strain: Not on file  Food Insecurity: No Food Insecurity (03/13/2024)   Hunger Vital Sign    Worried About Running Out of Food in the Last Year: Never true    Ran Out of Food in the Last Year: Never true  Transportation Needs: Unmet Transportation Needs (03/13/2024)   PRAPARE - Administrator, Civil Service (Medical): Yes    Lack of Transportation (Non-Medical): Yes  Physical Activity: Not on file  Stress: Not on file  Social Connections: Unknown (04/19/2022)   Received from [facility]   Social Network    Social Network: Not on file  Intimate Partner Violence: Not At Risk (03/13/2024)   Humiliation, Afraid, Rape, and Kick questionnaire    Fear of Current or Ex-Partner: No    Emotionally Abused: No    Physically Abused: No    Sexually Abused: No    Family History:    Family History  Problem Relation Age of Onset   Diabetes Mother    CAD Mother    Lung cancer Father      ROS:  Please see the history of present illness.   All other ROS reviewed and negative.     Physical Exam/Data:   Vitals:   03/13/24 2102 03/14/24 0427 03/14/24 0800 03/14/24 1207  BP: 132/62 135/89 126/63 121/61  Pulse: 87 72 75 80  Resp: 17 18    Temp: 98.6 F (37 C) 99.7 F (37.6 C)    TempSrc: Oral Oral    SpO2: 99% 98% 95% 100%  Weight:      Height:       No intake or output data in the 24 hours ending 03/14/24 1345    03/13/2024    1:08 AM 03/06/2023    4:53 PM 11/03/2022   12:25 PM  Last 3 Weights  Weight (lbs) 100 lb 123 lb 130 lb  Weight (kg) 45.36 kg 55.792 kg 58.968 kg     Body mass index is 17.16 kg/m.  General:  Under nourished appearing, well developed, in no acute distress HEENT: normal Neck: no JVD Vascular: No carotid bruits; Distal pulses 2+ bilaterally Cardiac:  normal S1, S2; irregular with PACs; no murmur  Lungs:  clear to auscultation bilaterally, no wheezing, rhonchi or rales  Abd: soft, nontender, no hepatomegaly  Ext:  no  edema Musculoskeletal:  No deformities, BUE and BLE strength normal and equal Skin: warm and dry  Neuro:  CNs 2-12 intact, no focal abnormalities noted Psych:  Normal affect   EKG:  The EKG was personally reviewed and demonstrates:  sinus arrhythmia frequent PACs. Rate reported on initial ECG is incorrect. Closer to 110bpm.  Telemetry:  Telemetry was personally reviewed and demonstrates:  sinus rhythm with frequent PACs.  Relevant CV Studies: 03/13/24 TTE  IMPRESSIONS     1. Left ventricular ejection fraction, by estimation, is 45 to 50%. Left  ventricular ejection fraction by 2D MOD biplane is 48.0 %. The left  ventricle has mildly decreased function. The left ventricle demonstrates  global hypokinesis. Left ventricular  diastolic parameters are indeterminate.   2. Right ventricular systolic function is normal. The right ventricular  size is normal. There is mildly elevated pulmonary artery systolic  pressure. The estimated right ventricular systolic pressure is 44.6 mmHg.   3. Left atrial size was mildly dilated.   4. The mitral valve is normal in structure. Mild mitral valve  regurgitation. No evidence of mitral stenosis.   5. The aortic valve is grossly normal. There is mild calcification of the  aortic valve. Aortic valve regurgitation is mild. Aortic valve sclerosis  is present, with no evidence of aortic valve stenosis.   6. The inferior vena cava is dilated in size with <50% respiratory  variability, suggesting right atrial pressure of 15 mmHg.   Comparison(s): Changes from prior study are noted. LVEF mildly reduced  compared to prior from 2020 (EF 60-65%). Mild global hypokinesis.   FINDINGS   Left Ventricle: Left ventricular ejection fraction, by estimation, is 45  to 50%. Left ventricular ejection fraction by 2D MOD biplane is 48.0 %.  The left ventricle has mildly decreased function. The left ventricle  demonstrates global hypokinesis. The  left ventricular  internal cavity size was normal in size. There is no left  ventricular hypertrophy. Left ventricular diastolic parameters are  indeterminate.   Right Ventricle: The right ventricular size is normal. No increase in  right ventricular wall thickness. Right ventricular systolic function is  normal. There is mildly elevated pulmonary artery systolic pressure. The  tricuspid regurgitant velocity is 2.72   m/s, and with an assumed right atrial pressure of 15 mmHg, the estimated  right ventricular systolic pressure is 44.6 mmHg.   Left Atrium: Left atrial size was mildly dilated.   Right Atrium: Right atrial size was normal in size.   Pericardium: There is no evidence of pericardial effusion.   Mitral Valve: The mitral valve is normal in structure. There is mild  thickening of the mitral valve leaflet(s). Mild mitral valve  regurgitation. No evidence of mitral valve stenosis.   Tricuspid Valve: The tricuspid valve is normal in structure. Tricuspid  valve regurgitation is mild . No evidence of tricuspid stenosis.   Aortic Valve: The aortic valve is grossly normal. There is mild  calcification of the aortic valve. Aortic valve regurgitation is mild.  Aortic valve sclerosis is present, with no evidence of aortic valve  stenosis.   Pulmonic Valve: The pulmonic valve was not well visualized. Pulmonic valve  regurgitation is not visualized. No evidence of pulmonic stenosis.   Aorta: The ascending aorta was not well visualized and the aortic root is  normal in size and structure.   Venous: The inferior vena cava is dilated in size with less than 50%  respiratory variability, suggesting right atrial pressure of 15 mmHg.  IAS/Shunts: No atrial level shunt detected by color flow Doppler.   Laboratory Data:  High Sensitivity Troponin:   Recent Labs  Lab 03/13/24 0124 03/13/24 0246  TROPONINIHS 8 7     Chemistry Recent Labs  Lab 03/13/24 0124 03/13/24 0740 03/14/24 0631  NA 139  141 140  K 2.7* 4.1 5.0  CL 106 110 107  CO2 20* 21* 22  GLUCOSE 123* 93 84  BUN 7* 6* 7*  CREATININE 0.56 0.44 0.65  CALCIUM 9.7 9.5 10.1  MG 1.3* 3.2* 1.6*  GFRNONAA >60 >60 >60  ANIONGAP 13 10 11     Recent Labs  Lab 03/14/24 0631  PROT 6.3*  ALBUMIN 3.0*  AST 16  ALT 14  ALKPHOS 115  BILITOT 0.6   Lipids  Recent Labs  Lab 03/13/24 0740  CHOL 133  TRIG 68  HDL 46  LDLCALC 73  CHOLHDL 2.9    Hematology Recent Labs  Lab 03/13/24 0124 03/14/24 0631  WBC 5.4 6.2  RBC 4.30 4.28  HGB 11.1* 11.1*  HCT 35.2* 35.2*  MCV 81.9 82.2  MCH 25.8* 25.9*  MCHC 31.5 31.5  RDW 13.1 13.2  PLT 251 262   Thyroid  Recent Labs  Lab 03/13/24 0740  TSH <0.010*  FREET4 2.12*    BNP Recent Labs  Lab 03/13/24 0125  BNP 111.0*    DDimer  Recent Labs  Lab 03/13/24 0124  DDIMER 0.30     Radiology/Studies:  ECHOCARDIOGRAM COMPLETE Result Date: 03/13/2024    ECHOCARDIOGRAM REPORT   Patient Name:   JAYNA MULNIX Date of Exam: 03/13/2024 Medical Rec #:  [id number]     Height:       64.0 in Accession #:    [id number]    Weight:       100.0 lb Date of Birth:  [birth year]     BSA:          1.457 m Patient Age:    67 years      BP:           129/65 mmHg Patient Gender: F             HR:           72 bpm. Exam Location:  Inpatient Procedure: 2D Echo, Cardiac Doppler and Color Doppler (Both Spectral and Color            Flow Doppler were utilized during procedure). Indications:    R07.9* Chest pain, unspecified; R06.02 SOB, Fever  History:        Patient has prior history of Echocardiogram examinations, most                 recent 07/12/2019. Arrythmias:Atrial Fibrillation; Risk                 Factors:Hypertension, Diabetes and Dyslipidemia.  Sonographer:    Irving Burton Senior RDCS Referring Phys: Russella Dar IMPRESSIONS  1. Left ventricular ejection fraction, by estimation, is 45 to 50%. Left ventricular ejection fraction by 2D MOD biplane is 48.0 %. The left ventricle has mildly decreased  function. The left ventricle demonstrates global hypokinesis. Left ventricular diastolic parameters are indeterminate.  2. Right ventricular systolic function is normal. The right ventricular size is normal. There is mildly elevated pulmonary artery systolic pressure. The estimated right ventricular systolic pressure is 44.6 mmHg.  3. Left atrial size was mildly dilated.  4. The mitral valve is normal in structure. Mild mitral valve regurgitation. No evidence of mitral  stenosis.  5. The aortic valve is grossly normal. There is mild calcification of the aortic valve. Aortic valve regurgitation is mild. Aortic valve sclerosis is present, with no evidence of aortic valve stenosis.  6. The inferior vena cava is dilated in size with <50% respiratory variability, suggesting right atrial pressure of 15 mmHg. Comparison(s): Changes from prior study are noted. LVEF mildly reduced compared to prior from 2020 (EF 60-65%). Mild global hypokinesis. FINDINGS  Left Ventricle: Left ventricular ejection fraction, by estimation, is 45 to 50%. Left ventricular ejection fraction by 2D MOD biplane is 48.0 %. The left ventricle has mildly decreased function. The left ventricle demonstrates global hypokinesis. The left ventricular internal cavity size was normal in size. There is no left ventricular hypertrophy. Left ventricular diastolic parameters are indeterminate. Right Ventricle: The right ventricular size is normal. No increase in right ventricular wall thickness. Right ventricular systolic function is normal. There is mildly elevated pulmonary artery systolic pressure. The tricuspid regurgitant velocity is 2.72  m/s, and with an assumed right atrial pressure of 15 mmHg, the estimated right ventricular systolic pressure is 44.6 mmHg. Left Atrium: Left atrial size was mildly dilated. Right Atrium: Right atrial size was normal in size. Pericardium: There is no evidence of pericardial effusion. Mitral Valve: The mitral valve is normal  in structure. There is mild thickening of the mitral valve leaflet(s). Mild mitral valve regurgitation. No evidence of mitral valve stenosis. Tricuspid Valve: The tricuspid valve is normal in structure. Tricuspid valve regurgitation is mild . No evidence of tricuspid stenosis. Aortic Valve: The aortic valve is grossly normal. There is mild calcification of the aortic valve. Aortic valve regurgitation is mild. Aortic valve sclerosis is present, with no evidence of aortic valve stenosis. Pulmonic Valve: The pulmonic valve was not well visualized. Pulmonic valve regurgitation is not visualized. No evidence of pulmonic stenosis. Aorta: The ascending aorta was not well visualized and the aortic root is normal in size and structure. Venous: The inferior vena cava is dilated in size with less than 50% respiratory variability, suggesting right atrial pressure of 15 mmHg. IAS/Shunts: No atrial level shunt detected by color flow Doppler.  LEFT VENTRICLE PLAX 2D                        Biplane EF (MOD) LVIDd:         4.70 cm         LV Biplane EF:   Left LVIDs:         3.60 cm                          ventricular LV PW:         0.90 cm                          ejection LV IVS:        0.70 cm                          fraction by LVOT diam:     1.80 cm                          2D MOD LV SV:         58  biplane is LV SV Index:   40                               48.0 %. LVOT Area:     2.54 cm  LV Volumes (MOD) LV vol d, MOD    130.0 ml A2C: LV vol d, MOD    100.0 ml A4C: LV vol s, MOD    69.1 ml A2C: LV vol s, MOD    50.3 ml A4C: LV SV MOD A2C:   60.9 ml LV SV MOD A4C:   100.0 ml LV SV MOD BP:    54.7 ml RIGHT VENTRICLE RV S prime:     11.70 cm/s TAPSE (M-mode): 2.1 cm LEFT ATRIUM             Index        RIGHT ATRIUM           Index LA diam:        3.00 cm 2.06 cm/m   RA Area:     17.20 cm LA Vol (A2C):   58.6 ml 40.25 ml/m  RA Volume:   47.10 ml  32.32 ml/m LA Vol (A4C):   40.9 ml 28.07 ml/m LA  Biplane Vol: 56.5 ml 38.77 ml/m  AORTIC VALVE LVOT Vmax:   131.00 cm/s LVOT Vmean:  87.800 cm/s LVOT VTI:    0.229 m  AORTA Ao Root diam: 2.60 cm TRICUSPID VALVE TR Peak grad:   29.6 mmHg TR Vmax:        272.00 cm/s  SHUNTS Systemic VTI:  0.23 m Systemic Diam: 1.80 cm Jodelle Red MD Electronically signed by Jodelle Red MD Signature Date/Time: 03/13/2024/9:51:48 PM    Final    DG Chest Port 1 View Result Date: 03/13/2024 CLINICAL DATA:  cp EXAM: PORTABLE CHEST 1 VIEW COMPARISON:  Chest x-ray 11/03/2022 FINDINGS: The heart and mediastinal contours are within normal limits. No focal consolidation. No pulmonary edema. No pleural effusion. No pneumothorax. No acute osseous abnormality. IMPRESSION: No active disease. Electronically Signed   By: Tish Frederickson M.D.   On: 03/13/2024 03:47     Assessment and Plan:   Sinus tachycardia/Frequent PACs Patient without access to prescription medications admitted in the setting of chest discomfort with acute hyperthyroidism.  Initially found to be in a tachyarrhythmia that admitting team felt was atrial fibrillation with RVR.  Subsequently had spontaneous improvement in HR. Upon my personal review of EKG tracings this admission, patient not in atrial fibrillation or flutter but rather an sinus tachycardia with frequent PACs.  This tracing mimics historical EKG tracings that are available in patient's chart.  No clear documented occurrence of atrial fibrillation in the past. Patient remains in normal sinus rhythm today.  Given acute on chronic hypothyroidism, would suggest transitioning from atenolol to propranolol.  This would benefit patient both from rate control standpoint as well as have endocrine benefit in reducing conversion of free T4 to T3.  New HFmrEF Patient found to have borderline reduced ejection fraction, 45 to 60%, on echocardiogram this admission.  Given negative troponin x 2 as well as reassuring BNP and no overt symptoms of  congestive heart failure, suspect that patient likely has mild cardiomyopathy in the setting of atrial tachycardia likely provoked by untreated hypothyroidism. On my personal review of TTE, LVEF appears slightly better than 50%. PACs were frequent during this study and make interpretation more difficult. Primary focus should be on thyroid management  and HR control. Will plan to optimize medications for heart failure with moderately reduced ejection fraction, though will need to be very conscious of patient's financial limitations. Start low dose Losartan 25mg  on 4/11. Given no evidence of hypervolemia and frailty, would be hesitant to add MRA.  Hyperlipidemia LDL at goal of 100mg /dL for patient without known CAD.   Lab Results  Component Value Date   CHOL 133 03/13/2024   HDL 46 03/13/2024   LDLCALC 73 03/13/2024   TRIG 68 03/13/2024   CHOLHDL 2.9 03/13/2024     Per primary team: Graves disease Schizoaffective disorder Tobacco use    Risk Assessment/Risk Scores:        New York Heart Association (NYHA) Functional Class NYHA Class II      For questions or updates, please contact [facility] Please consult [email or web address] for contact info under    Signed, Perlie Gold, PA-C  03/14/2024 1:45 PM

## 2024-03-14 NOTE — Plan of Care (Signed)

## 2024-03-14 NOTE — Evaluation (Signed)
 Occupational Therapy Evaluation Patient Details Name: Shari [name] MRN: [id number] DOB: 29-Oct-1956 Today's Date: 03/14/2024   History of Present Illness   Pt is a 68 y/o F presenting to ED on 4/9 from [facility] with substernal chest pain, CXR engative, admitted for hypokalemia. PMH includes DM2, HTN, HLD, GERD, Graves disease, paroxysmal A fib, schizoaffective disorder     Clinical Impressions Pt reports ind at baseline with ADLs/functional mobility, from [facility] and reports she goes there daily for meals/ADLs. Pt currently close to baseline needing supervision- mod I for ADLs, ind with bed mobility and ind with transfers without AD. Pt presenting with impairments listed below, however has no acute OT needs at this time, will s/o. Please reconsult if there is a change in pt status.     If plan is discharge home, recommend the following:   Assist for transportation;Assistance with cooking/housework     Functional Status Assessment   Patient has had a recent decline in their functional status and demonstrates the ability to make significant improvements in function in a reasonable and predictable amount of time.     Equipment Recommendations   None recommended by OT     Recommendations for Other Services   PT consult     Precautions/Restrictions         Mobility Bed Mobility Overal bed mobility: Independent             General bed mobility comments: long sitting in bed upon arrival    Transfers Overall transfer level: Independent Equipment used: None                      Balance Overall balance assessment: No apparent balance deficits (not formally assessed)                                         ADL either performed or assessed with clinical judgement   ADL Overall ADL's : Needs assistance/impaired Eating/Feeding: Independent   Grooming: Supervision/safety;Standing   Upper Body Bathing: Supervision/ safety   Lower Body  Bathing: Supervison/ safety   Upper Body Dressing : Modified independent   Lower Body Dressing: Modified independent   Toilet Transfer: Independent   Toileting- Clothing Manipulation and Hygiene: Supervision/safety       Functional mobility during ADLs: Independent       Vision   Additional Comments: not formally assessed, WFL for BADL and mobility     Perception Perception: Not tested       Praxis Praxis: Not tested       Pertinent Vitals/Pain Pain Assessment Pain Assessment: Faces Pain Score: 2  Faces Pain Scale: Hurts a little bit Pain Location: R shoulder Pain Descriptors / Indicators: Discomfort Pain Intervention(s): Limited activity within patient's tolerance, Monitored during session, Repositioned     Extremity/Trunk Assessment Upper Extremity Assessment Upper Extremity Assessment: Generalized weakness;Right hand dominant   Lower Extremity Assessment Lower Extremity Assessment: Defer to PT evaluation   Cervical / Trunk Assessment Cervical / Trunk Assessment: Normal   Communication Communication Communication: No apparent difficulties   Cognition Arousal: Alert Behavior During Therapy: Flat affect Cognition: No family/caregiver present to determine baseline                               Following commands: Intact       Cueing  General Comments  Cueing Techniques: Gestural cues;Verbal cues  VSS   Exercises     Shoulder Instructions      Home Living Family/patient expects to be discharged to:: Shelter/Homeless                                 Additional Comments: goes to [facility] daily for bathing, meals, sleeping      Prior Functioning/Environment Prior Level of Function : Independent/Modified Independent             Mobility Comments: ind ADLs Comments: ind    OT Problem List: Decreased strength;Decreased range of motion;Decreased activity tolerance;Impaired balance (sitting and/or standing)   OT  Treatment/Interventions:        OT Goals(Current goals can be found in the care plan section)   Acute Rehab OT Goals Patient Stated Goal: none stated OT Goal Formulation: With patient Time For Goal Achievement: 03/28/24 Potential to Achieve Goals: Good   OT Frequency:       Co-evaluation              AM-PAC OT "6 Clicks" Daily Activity     Outcome Measure Help from another person eating meals?: None Help from another person taking care of personal grooming?: None Help from another person toileting, which includes using toliet, bedpan, or urinal?: None Help from another person bathing (including washing, rinsing, drying)?: None Help from another person to put on and taking off regular upper body clothing?: None Help from another person to put on and taking off regular lower body clothing?: None 6 Click Score: 24   End of Session Nurse Communication: Mobility status  Activity Tolerance: Patient tolerated treatment well Patient left: in bed;with call bell/phone within reach  OT Visit Diagnosis: Unsteadiness on feet (R26.81);Other abnormalities of gait and mobility (R26.89);Muscle weakness (generalized) (M62.81)                Time: 5409-8119 OT Time Calculation (min): 12 min Charges:  OT General Charges $OT Visit: 1 Visit OT Evaluation $OT Eval Low Complexity: 1 Low  Aidyn Sportsman K, OTD, OTR/L SecureChat Preferred Acute Rehab [phone]   Jasma Seevers K Koonce 03/14/2024, 12:16 PM

## 2024-03-14 NOTE — Progress Notes (Addendum)
 PROGRESS NOTE    Shari [name]  ZOX:[id number] DOB: [birth year] DOA: 03/13/2024 PCP: Jamey Reas, PA-C   Chief Complaint  Patient presents with   Shortness of Breath   Chest Pain    Brief Narrative:  Patient is a 68 year old female with homelessness, Graves' disease/hyperthyroidism off medication for the past 8 months, schizoaffective disorder, postmenopausal bleeding, paroxysmal A-fib not on chronic anticoagulation, GERD, dyslipidemia, hypertension presenting to the ED with shortness of breath and substernal chest pain nonradiating. Patient initially on presentation noted to be tachycardic, BP of 149/81, noted to be febrile on presentation to triage with a temp of 101.8. Patient noted not to be hypoxic. Initial EKG with A-fib with RVR of 141 and spontaneously converted to normal sinus rhythm. Patient noted to have frequent PACs. Patient also with electrolyte abnormalities.    Assessment & Plan:   Principal Problem:   Hypokalemia Active Problems:   Hypomagnesemia   Chest pain   Shortness of breath   Fever   Hyperlipidemia  #1 fever/chest pain/shortness of breath/abnormal 2D echo -??  Etiology. -Chest x-ray done on admission negative, COVID-19 PCR negative, influenza AMB PCR negative, RSV PCR negative. -Urinalysis pending. -Blood cultures ordered and pending with no growth to date. -Chest pain atypical in nature, EKG with no ischemic changes noted, cardiac enzymes negative as well as D-dimer. -2D echo obtained with a EF of 45 to 50%, left ventricular global hypokinesis.  Mildly dilated left atrial size.  Mild MVR.   -Due to abnormal 2D echo findings we will consult with cardiology for further evaluation and management.   -Patient currently afebrile, no leukocytosis, supportive care.    2.  Graves' disease/hyperthyroidism -TSH obtained <0.010, free T4 of 212 consistent with severe hypothyroidism. -Patient with no signs of acute thyrotoxicosis however on presentation also  noted to be in A-fib with RVR transiently. -Patient noted to have been off all her medications including Tapazole for over 8 months. -Continue Tapazole, atenolol 12.5 mg daily. -Will need outpatient follow-up with her endocrinologist for further management.   3.  Hypokalemia/hypomagnesemia -Repleted. -Magnesium at 1.6. -Magnesium sulfate 4 g IV x 1. -Repeat labs in the AM.   4.  Schizoaffective disorder -Per med rec patient supposed to be taking Zyprexa and Depakote and had previously been on trazodone however patient states has not been on any medication for over the past 8 months. -Continue Depakote 500 mg nightly, Zyprexa 10 mg nightly. -Psychiatry consulted and assessed the patient and recommended continuation of Zyprexa and Depakote at current doses with outpatient follow-up.    5.  Hypertension -Atenolol.    6.  Hyperlipidemia -It appears patient has not taken Lipitor since 2023. -Fasting lipid panel with a LDL of 73. -Continue lifestyle modification.   7.  Tobacco abuse -Tobacco cessation. -Patient states unable to tolerate nicotine patch and at this time declining nicotine gum.   DVT prophylaxis: Lovenox Code Status: Full Family Communication: Updated patient.  No family at bedside. Disposition: Patient noted to be homeless.  TOC consulted.  TBD  Status is: Observation The patient remains OBS appropriate and will d/c before 2 midnights.   Consultants:  Psychiatry Cardiology  Procedures:  Chest x-ray 03/13/2024 2D echo 03/13/2024 Thyroid ultrasound 03/13/2024  Antimicrobials:  Anti-infectives (From admission, onward)    None         Subjective: Patient sitting up in bed.  Looking at the menu.  States occasionally feels like her heart is going slow.  Complaining of pain in the right shoulder.  States some intermittent shortness of breath.  Overall feeling better than she did on admission.  Objective: Vitals:   03/13/24 2102 03/14/24 0427 03/14/24 0800  03/14/24 1207  BP: 132/62 135/89 126/63 121/61  Pulse: 87 72 75 80  Resp: 17 18    Temp: 98.6 F (37 C) 99.7 F (37.6 C)    TempSrc: Oral Oral    SpO2: 99% 98% 95% 100%  Weight:      Height:       No intake or output data in the 24 hours ending 03/14/24 1225 Filed Weights   03/13/24 0108  Weight: 45.4 kg    Examination:  General exam: Appears calm and comfortable  Respiratory system: Clear to auscultation. Respiratory effort normal. Cardiovascular system: S1 & S2 heard, RRR. No JVD, murmurs, rubs, gallops or clicks. No pedal edema. Gastrointestinal system: Abdomen is nondistended, soft and nontender. No organomegaly or masses felt. Normal bowel sounds heard. Central nervous system: Alert and oriented. No focal neurological deficits. Extremities: Symmetric 5 x 5 power. Skin: No rashes, lesions or ulcers Psychiatry: Judgement and insight appear normal. Mood & affect appropriate.     Data Reviewed: I have personally reviewed following labs and imaging studies  CBC: Recent Labs  Lab 03/13/24 0124 03/14/24 0631  WBC 5.4 6.2  HGB 11.1* 11.1*  HCT 35.2* 35.2*  MCV 81.9 82.2  PLT 251 262    Basic Metabolic Panel: Recent Labs  Lab 03/13/24 0124 03/13/24 0740 03/14/24 0631  NA 139 141 140  K 2.7* 4.1 5.0  CL 106 110 107  CO2 20* 21* 22  GLUCOSE 123* 93 84  BUN 7* 6* 7*  CREATININE 0.56 0.44 0.65  CALCIUM 9.7 9.5 10.1  MG 1.3* 3.2* 1.6*    GFR: Estimated Creatinine Clearance: 48.9 mL/min (by C-G formula based on SCr of 0.65 mg/dL).  Liver Function Tests: Recent Labs  Lab 03/14/24 0631  AST 16  ALT 14  ALKPHOS 115  BILITOT 0.6  PROT 6.3*  ALBUMIN 3.0*    CBG: No results for input(s): "GLUCAP" in the last 168 hours.   Recent Results (from the past 240 hours)  Resp panel by RT-PCR (RSV, Flu A&B, Covid) Anterior Nasal Swab     Status: None   Collection Time: 03/13/24  7:29 AM   Specimen: Anterior Nasal Swab  Result Value Ref Range Status   SARS  Coronavirus 2 by RT PCR NEGATIVE NEGATIVE Final   Influenza A by PCR NEGATIVE NEGATIVE Final   Influenza B by PCR NEGATIVE NEGATIVE Final    Comment: (NOTE) The Xpert Xpress SARS-CoV-2/FLU/RSV plus assay is intended as an aid in the diagnosis of influenza from Nasopharyngeal swab specimens and should not be used as a sole basis for treatment. Nasal washings and aspirates are unacceptable for Xpert Xpress SARS-CoV-2/FLU/RSV testing.  Fact Sheet for Patients: BloggerCourse.com  Fact Sheet for Healthcare Providers: SeriousBroker.it  This test is not yet approved or cleared by the [place] FDA and has been authorized for detection and/or diagnosis of SARS-CoV-2 by FDA under an Emergency Use Authorization (EUA). This EUA will remain in effect (meaning this test can be used) for the duration of the COVID-19 declaration under Section 564(b)(1) of the Act, 21 U.S.C. section 360bbb-3(b)(1), unless the authorization is terminated or revoked.     Resp Syncytial Virus by PCR NEGATIVE NEGATIVE Final    Comment: (NOTE) Fact Sheet for Patients: BloggerCourse.com  Fact Sheet for Healthcare Providers: SeriousBroker.it  This test is not yet approved  or cleared by the [place] and has been authorized for detection and/or diagnosis of SARS-CoV-2 by FDA under an Emergency Use Authorization (EUA). This EUA will remain in effect (meaning this test can be used) for the duration of the COVID-19 declaration under Section 564(b)(1) of the Act, 21 U.S.C. section 360bbb-3(b)(1), unless the authorization is terminated or revoked.  Performed at [facility], [address] [place], [place] [address]   Culture, blood (Routine X 2) w Reflex to ID Panel     Status: None (Preliminary result)   Collection Time: 03/13/24  8:00 AM   Specimen: BLOOD  Result Value Ref Range Status   Specimen  Description BLOOD SITE NOT SPECIFIED  Final   Special Requests   Final    BOTTLES DRAWN AEROBIC ONLY Blood Culture results may not be optimal due to an inadequate volume of blood received in culture bottles   Culture   Final    NO GROWTH < 24 HOURS Performed at [facility], [address] [place], [place] [address]    Report Status PENDING  Incomplete  Culture, blood (Routine X 2) w Reflex to ID Panel     Status: None (Preliminary result)   Collection Time: 03/13/24  8:05 AM   Specimen: BLOOD  Result Value Ref Range Status   Specimen Description BLOOD SITE NOT SPECIFIED  Final   Special Requests   Final    BOTTLES DRAWN AEROBIC ONLY Blood Culture results may not be optimal due to an inadequate volume of blood received in culture bottles   Culture   Final    NO GROWTH < 24 HOURS Performed at [facility], [address] [place], [place] [address]    Report Status PENDING  Incomplete  Urine Culture (for pregnant, neutropenic or urologic patients or patients with an indwelling urinary catheter)     Status: None (Preliminary result)   Collection Time: 03/13/24 11:51 AM   Specimen: Urine, Clean Catch  Result Value Ref Range Status   Specimen Description URINE, CLEAN CATCH  Final   Special Requests NONE  Final   Culture   Final    CULTURE REINCUBATED FOR BETTER GROWTH Performed at [facility], [address] [place], [place] [address]    Report Status PENDING  Incomplete         Radiology Studies: ECHOCARDIOGRAM COMPLETE Result Date: 03/13/2024    ECHOCARDIOGRAM REPORT   Patient Name:   Shari [name] Date of Exam: 03/13/2024 Medical Rec #:  [id number]     Height:       64.0 in Accession #:    [id number]    Weight:       100.0 lb Date of Birth:  [birth year]     BSA:          1.457 m Patient Age:    67 years      BP:           129/65 mmHg Patient Gender: F             HR:           72 bpm. Exam Location:  Inpatient Procedure: 2D Echo, Cardiac Doppler and Color Doppler  (Both Spectral and Color            Flow Doppler were utilized during procedure). Indications:    R07.9* Chest pain, unspecified; R06.02 SOB, Fever  History:        Patient has prior history  of Echocardiogram examinations, most                 recent 07/12/2019. Arrythmias:Atrial Fibrillation; Risk                 Factors:Hypertension, Diabetes and Dyslipidemia.  Sonographer:    Irving Burton Senior RDCS Referring Phys: Russella Dar IMPRESSIONS  1. Left ventricular ejection fraction, by estimation, is 45 to 50%. Left ventricular ejection fraction by 2D MOD biplane is 48.0 %. The left ventricle has mildly decreased function. The left ventricle demonstrates global hypokinesis. Left ventricular diastolic parameters are indeterminate.  2. Right ventricular systolic function is normal. The right ventricular size is normal. There is mildly elevated pulmonary artery systolic pressure. The estimated right ventricular systolic pressure is 44.6 mmHg.  3. Left atrial size was mildly dilated.  4. The mitral valve is normal in structure. Mild mitral valve regurgitation. No evidence of mitral stenosis.  5. The aortic valve is grossly normal. There is mild calcification of the aortic valve. Aortic valve regurgitation is mild. Aortic valve sclerosis is present, with no evidence of aortic valve stenosis.  6. The inferior vena cava is dilated in size with <50% respiratory variability, suggesting right atrial pressure of 15 mmHg. Comparison(s): Changes from prior study are noted. LVEF mildly reduced compared to prior from 2020 (EF 60-65%). Mild global hypokinesis. FINDINGS  Left Ventricle: Left ventricular ejection fraction, by estimation, is 45 to 50%. Left ventricular ejection fraction by 2D MOD biplane is 48.0 %. The left ventricle has mildly decreased function. The left ventricle demonstrates global hypokinesis. The left ventricular internal cavity size was normal in size. There is no left ventricular hypertrophy. Left ventricular  diastolic parameters are indeterminate. Right Ventricle: The right ventricular size is normal. No increase in right ventricular wall thickness. Right ventricular systolic function is normal. There is mildly elevated pulmonary artery systolic pressure. The tricuspid regurgitant velocity is 2.72  m/s, and with an assumed right atrial pressure of 15 mmHg, the estimated right ventricular systolic pressure is 44.6 mmHg. Left Atrium: Left atrial size was mildly dilated. Right Atrium: Right atrial size was normal in size. Pericardium: There is no evidence of pericardial effusion. Mitral Valve: The mitral valve is normal in structure. There is mild thickening of the mitral valve leaflet(s). Mild mitral valve regurgitation. No evidence of mitral valve stenosis. Tricuspid Valve: The tricuspid valve is normal in structure. Tricuspid valve regurgitation is mild . No evidence of tricuspid stenosis. Aortic Valve: The aortic valve is grossly normal. There is mild calcification of the aortic valve. Aortic valve regurgitation is mild. Aortic valve sclerosis is present, with no evidence of aortic valve stenosis. Pulmonic Valve: The pulmonic valve was not well visualized. Pulmonic valve regurgitation is not visualized. No evidence of pulmonic stenosis. Aorta: The ascending aorta was not well visualized and the aortic root is normal in size and structure. Venous: The inferior vena cava is dilated in size with less than 50% respiratory variability, suggesting right atrial pressure of 15 mmHg. IAS/Shunts: No atrial level shunt detected by color flow Doppler.  LEFT VENTRICLE PLAX 2D                        Biplane EF (MOD) LVIDd:         4.70 cm         LV Biplane EF:   Left LVIDs:         3.60 cm  ventricular LV PW:         0.90 cm                          ejection LV IVS:        0.70 cm                          fraction by LVOT diam:     1.80 cm                          2D MOD LV SV:         58                                biplane is LV SV Index:   40                               48.0 %. LVOT Area:     2.54 cm  LV Volumes (MOD) LV vol d, MOD    130.0 ml A2C: LV vol d, MOD    100.0 ml A4C: LV vol s, MOD    69.1 ml A2C: LV vol s, MOD    50.3 ml A4C: LV SV MOD A2C:   60.9 ml LV SV MOD A4C:   100.0 ml LV SV MOD BP:    54.7 ml RIGHT VENTRICLE RV S prime:     11.70 cm/s TAPSE (M-mode): 2.1 cm LEFT ATRIUM             Index        RIGHT ATRIUM           Index LA diam:        3.00 cm 2.06 cm/m   RA Area:     17.20 cm LA Vol (A2C):   58.6 ml 40.25 ml/m  RA Volume:   47.10 ml  32.32 ml/m LA Vol (A4C):   40.9 ml 28.07 ml/m LA Biplane Vol: 56.5 ml 38.77 ml/m  AORTIC VALVE LVOT Vmax:   131.00 cm/s LVOT Vmean:  87.800 cm/s LVOT VTI:    0.229 m  AORTA Ao Root diam: 2.60 cm TRICUSPID VALVE TR Peak grad:   29.6 mmHg TR Vmax:        272.00 cm/s  SHUNTS Systemic VTI:  0.23 m Systemic Diam: 1.80 cm Jodelle Red MD Electronically signed by Jodelle Red MD Signature Date/Time: 03/13/2024/9:51:48 PM    Final    DG Chest Port 1 View Result Date: 03/13/2024 CLINICAL DATA:  cp EXAM: PORTABLE CHEST 1 VIEW COMPARISON:  Chest x-ray 11/03/2022 FINDINGS: The heart and mediastinal contours are within normal limits. No focal consolidation. No pulmonary edema. No pleural effusion. No pneumothorax. No acute osseous abnormality. IMPRESSION: No active disease. Electronically Signed   By: Tish Frederickson M.D.   On: 03/13/2024 03:47        Scheduled Meds:  atenolol  12.5 mg Oral Daily   divalproex  500 mg Oral QHS   enoxaparin (LOVENOX) injection  30 mg Subcutaneous Daily   methimazole  10 mg Oral Daily   OLANZapine  10 mg Oral QHS   sodium chloride flush  3 mL Intravenous Q12H   Continuous Infusions:   LOS: 0 days    Time spent: 40 minutes  Ramiro Harvest, MD [facility]   To contact the attending provider between 7A-7P or the covering provider during after hours 7P-7A, please log into the web site  [email or web address] and access using universal [place] password for that web site. If you do not have the password, please call the hospital operator.  03/14/2024, 12:25 PM

## 2024-03-14 NOTE — TOC Progression Note (Signed)
 Transition of Care [facility]) - Progression Note    Patient Details  Name: Shari [name] MRN: [id number] Date of Birth: 30-May-1956  [facility] of [facility]) CM/SW Contact  Theran Vandergrift A [place], LCSW Phone Number: 03/14/2024, 4:36 PM  Clinical Narrative:      CSW scheduled behavioral health follow up appointment for pt at [facility]. Details in AVS.  No other needs identified at this time. TOC will sign off, please consult again if TOC needs arise.         Expected Discharge Plan and Services                                               Social Determinants of Health (SDOH) Interventions SDOH Screenings   Food Insecurity: No Food Insecurity (03/13/2024)  Housing: High Risk (03/13/2024)  Transportation Needs: Unmet Transportation Needs (03/13/2024)  Utilities: Not At Risk (03/13/2024)  Alcohol Screen: Low Risk  (01/05/2022)  Social Connections: Unknown (04/19/2022)   Received from [facility]  Tobacco Use: High Risk (03/13/2024)    Readmission Risk Interventions     No data to display

## 2024-03-14 NOTE — Consult Note (Signed)
 [facility] Psychiatric Consult Initial  Patient Name: .Shari [name]  MRN: [id number]  DOB: 11-Oct-1956  Consult Order details:  Orders (From admission, onward)     Start     Ordered   03/13/24 1019  IP CONSULT TO PSYCHIATRY       Comments: Homeless lady.  Not taking medications for greater than 3 months.  Unclear exactly what she is supposed to be taking for schizoaffective disorder.  I have resumed previously ordered Zyprexa and Depakote.  LFTs are normal.  Ordering Provider: Russella Dar, NP  Provider:  (Not yet assigned)  Question Answer Comment  Location [facility]   Reason for Consult? Medication clarification/adjustments      03/13/24 1018             Mode of Visit: In person    Psychiatry Consult Evaluation  Service Date: March 14, 2024 LOS:  LOS: 0 days  Chief Complaint "I'm here for SOB and my heart"  Primary Psychiatric Diagnoses  1.  Schizoaffective D/O, bipolar type by hx   Assessment  Shari [name] is a 68 y.o. female admitted: Medically for SOB and substernal chest pain that does not radiate on 03/13/2024 12:30 AM. She carries the psychiatric diagnoses of Schizoaffective d/o and has a past medical history of  GERD, Dyslipidemia, HTN and Afib with current EKG showing dysrhythmia.     Shari [name] is a 68 y.o. female with a history of Schizoaffective d/o and multiple ED presentation and an extensive history of treatment non compliance. Current outpatient psychotropic medications include Zyprexa and Depakote  which she has not being compliant with for several months. On initial examination, patient is alert and oriented x 4, appears guarded and uninterested with assessment. Conversation was largely coherent and goal directed without preoccupation.She denies having suicidal or homicidal ideation. Denies AH/VH, no paranoia or delusion voiced and does not appear to be RTIS but mentions being a "voice talker." [name] historically noted to hear  voices.   Given the chronicity of her illness, as evidence by multiple ED presentations, history of treatment noncompliance possibly being complicated by psychosocial stressors such as chronic homeless and limited support, patient will benefit from closer monitoring and support by their outpatient team.   At this time, patient's symptoms does not require acute psychiatric hospitalization but may ultimately benefit from re-engaging with outpatient psychiatry with consideration for a long acting injectable to manage noncompliance.    Please see plan below for detailed recommendations.    Diagnoses:  Active Hospital problems: Principal Problem:   Hypokalemia Active Problems:   Hypomagnesemia   Chest pain   Shortness of breath   Fever   Hyperlipidemia    Plan   ## Psychiatric Medication Recommendations:  -- Continue Zyprexa 10 mg po nightly -- Continue Depakote 500 mg po nightly -- Recommend re-engagement with outpatient treatment -- Provide patient with community resources -- Recommend consideration for ACTT  ## Medical Decision Making Capacity: Not specifically addressed in this encounter  ## Further Work-up:  -- most recent EKG on 4/9 had QtC of 512 -- Pertinent labwork reviewed earlier this admission includes: -TSH obtained <0.010, free T4 of 2.12 consistent with severe hyperthyroidism, BUN- 7, CBC, BMP     ## Disposition:-- There are no psychiatric contraindications to discharge at this time  ## Behavioral / Environmental: - No specific recommendations at this time.     ## Safety and Observation Level:  - Based on my clinical evaluation, I  estimate the patient to be at Low risk of self harm in the current setting. - At this time, we recommend  routine. This decision is based on my review of the chart including patient's history and current presentation, interview of the patient, mental status examination, and consideration of suicide risk including evaluating suicidal  ideation, plan, intent, suicidal or self-harm behaviors, risk factors, and protective factors. This judgment is based on our ability to directly address suicide risk, implement suicide prevention strategies, and develop a safety plan while the patient is in the clinical setting. Please contact our team if there is a concern that risk level has changed.  CSSR Risk Category:C-SSRS RISK CATEGORY: No Risk  Suicide Risk Assessment: Patient has following modifiable risk factors for suicide: lack of access to outpatient mental health resources, which we are addressing by medication re initiation. Patient has following non-modifiable or demographic risk factors for suicide: psychiatric hospitalization Patient has the following protective factors against suicide: no history of suicide attempts and no history of NSSIB  Thank you for this consult request. Recommendations have been communicated to the primary team.  We will sign off at this time.   Marcell Anger, NP       History of Present Illness  Relevant Aspects of [facility] Course:  Admitted on 03/13/2024 for or SOB and substernal chest pain that does not radiate . They   Patient Report:  Patient met sitting up in bed watching TV. She was initially approachable but quickly became guarded and uninterested with the mental health questions. She describes her presenting problem as "I'm here for SOB and my heart." Currently denies CP or SOB. Patient states that she is not interested in discussing about her mental health as she is only here for medical reasons. She admits to noncompliance with medication and is not currently connected with outpatient services. Patient describes her mood  as "good, I'm stable." She denies feeling suicidal or homicidal. Also denies AH/VH, does not appear to be RTIS but mentions but made a bizarre statement about being a "voice talker" without willing to explain. Patient is currently homeless and lives at the the  [employer].    Psych ROS:  Depression: Patient denies feeling depressed Anxiety:  Patient denies symptoms of anxiety Mania (lifetime and current): Patient denies manic symptoms Psychosis: (lifetime and current): Hx but currently denies   Collateral information:  Contacted   Review of Systems  HENT: Negative.    Eyes: Negative.   Respiratory:  Positive for shortness of breath. Negative for cough and sputum production.        Occasional SOB  Cardiovascular:  Negative for chest pain and orthopnea.  Genitourinary: Negative.   Psychiatric/Behavioral:  Negative for depression, hallucinations, memory loss, substance abuse and suicidal ideas. The patient is not nervous/anxious and does not have insomnia.      Psychiatric and Social History  Psychiatric History:  Information collected from Patient and chart review.   Prev Dx/Sx: Schizoaffective d/o, Depression Current Psych Provider: Not currently connected Home Meds (current): None in 8 months Previous Med Trials: Olanzapine,  Therapy: denies  Prior Psych Hospitalization: Multiple  Prior Self Harm: Denies hx Prior Violence: denies  Family Psych History: denies Family Hx suicide: denies  Social History:  Developmental Hx: Unknown Educational Hx: Some college Occupational Hx: Unemployed Legal Hx: denies Living Situation: homeless and living in a shelter Spiritual Hx: denies Access to weapons/lethal means: Denies   Substance History Alcohol: denies  Type of alcohol -n/a  Last Drink -n/a Number of drinks per day -n/a History of alcohol withdrawal seizures -n/a History of DT's -n/a Tobacco: smokes about 3 cigarettes a day Illicit drugs: denies Prescription drug abuse: denies Rehab hx: denies  Exam Findings  Physical Exam:  Vital Signs:  Temp:  [98.2 F (36.8 C)-99.7 F (37.6 C)] 99.7 F (37.6 C) (04/10 0427) Pulse Rate:  [72-87] 75 (04/10 0800) Resp:  [17-26] 18 (04/10 0427) BP:  (121-140)/(61-89) 126/63 (04/10 0800) SpO2:  [95 %-100 %] 95 % (04/10 0800) Blood pressure 126/63, pulse 75, temperature 99.7 F (37.6 C), temperature source Oral, resp. rate 18, height 5\' 4"  (1.626 m), weight 45.4 kg, SpO2 95%. Body mass index is 17.16 kg/m.  Physical Exam  Mental Status Exam: General Appearance: Casual and thin, fairly groomed.  Orientation:  Full (Time, Place, and Person)  Memory:  Immediate;   Good  Concentration:  grossly intact but not formally tested  Recall:  Goodgrossly intact but not formally tested  Attention  grossly intact but not formally tested  Eye Contact:  Good  Speech:  Normal Rate and difficult to understand at times  Language:  Good  Volume:  Normal  Mood: "good, I'm stable"  Affect:   congruent  Thought Process:  Goal Directed and Linear  Thought Content:  Logical  Suicidal Thoughts:  No  Homicidal Thoughts:  No  Judgement:  Fair  Insight:  Fair  Psychomotor Activity:  Normal  Akathisia:  No  Fund of Knowledge:  Fair      Assets:  Communication Skills  Cognition:  WNL  ADL's:  Intact  AIMS (if indicated):        Other History   These have been pulled in through the EMR, reviewed, and updated if appropriate.  Family History:  The patient's family history includes CAD in her mother; Diabetes in her mother; Lung cancer in her father.  Medical History: Past Medical History:  Diagnosis Date   Eye globe prosthesis    GERD (gastroesophageal reflux disease)    History of blood transfusion 1973   "related to abscess burst in my stomach"   Hyperlipemia    Hyperlipidemia    Hypertension    Osteoarthritis of back    Lowerback    SVD (spontaneous vaginal delivery)    x 1, baby died at 2 wks of age   Type II diabetes mellitus (HCC)     Surgical History: Past Surgical History:  Procedure Laterality Date   APPENDECTOMY     COLONOSCOPY     DILATATION & CURETTAGE/HYSTEROSCOPY WITH MYOSURE N/A 02/25/2015   Procedure: DILATATION &  CURETTAGE/HYSTEROSCOPY WITH MYOSURE;  Surgeon: [name], MD;  Location: [facility];  Service: Gynecology;  Laterality: N/A;   DILATION AND CURETTAGE OF UTERUS     ENUCLEATION Right 03/16/2017   ENUCLEATION Right 03/16/2017   Procedure: ENUCLEATION RIGHT EYE;  Surgeon: [name], MD;  Location: [facility];  Service: Ophthalmology;  Laterality: Right;   EYE SURGERY     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6, 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   EYE SURGERY Right ~ 1974   "S/P initial eye injury; scissors stuck in my eye""   LAPAROSCOPIC CHOLECYSTECTOMY     SHOULDER ARTHROSCOPY WITH ROTATOR CUFF REPAIR Left    wire stitches per patient   SHOULDER ARTHROSCOPY WITH ROTATOR CUFF REPAIR Right      Medications:   Current Facility-Administered Medications:    acetaminophen (TYLENOL) tablet 650 mg, 650 mg, Oral, Q6H PRN, Darlin Drop,  DO, 650 mg at 03/13/24 0848   atenolol (TENORMIN) tablet 12.5 mg, 12.5 mg, Oral, Daily, Russella Dar, NP, 12.5 mg at 03/14/24 6962   divalproex (DEPAKOTE) DR tablet 500 mg, 500 mg, Oral, QHS, Russella Dar, NP, 500 mg at 03/13/24 2202   enoxaparin (LOVENOX) injection 30 mg, 30 mg, Subcutaneous, Daily, Hall, Carole N, DO, 30 mg at 03/14/24 9528   melatonin tablet 5 mg, 5 mg, Oral, QHS PRN, Darlin Drop, DO   methimazole (TAPAZOLE) tablet 10 mg, 10 mg, Oral, Daily, Russella Dar, NP, 10 mg at 03/14/24 0829   OLANZapine (ZYPREXA) tablet 10 mg, 10 mg, Oral, QHS, Russella Dar, NP, 10 mg at 03/13/24 2202   polyethylene glycol (MIRALAX / GLYCOLAX) packet 17 g, 17 g, Oral, Daily PRN, Margo Aye, Carole N, DO   prochlorperazine (COMPAZINE) injection 5 mg, 5 mg, Intravenous, Q6H PRN, Hall, Carole N, DO   sodium chloride flush (NS) 0.9 % injection 3 mL, 3 mL, Intravenous, Q12H, Russella Dar, NP, 3 mL at 03/14/24 4132  Allergies: Allergies  Allergen Reactions   Aspirin Hives and Itching   Chocolate Hives and Itching   Cocoa Itching and Rash   Penicillins Hives, Itching, Swelling and  Other (See Comments)    Has patient had a PCN reaction causing IMMEDIATE RASH, FACIAL/TONGUE/THROAT SWELLING, SOB, OR LIGHTHEADEDNESS WITH HYPOTENSION:  #  #  #  YES  #  #  #  Has patient had a PCN reaction causing severe rash involving mucus membranes or skin necrosis: No Has patient had a PCN reaction that required hospitalization No Has patient had a PCN reaction occurring within the last 10 years: No If all of the above answers are "NO", then may proceed with Cephalosporin use.    Sulfa Antibiotics Hives and Itching    Syrus Nakama, NP

## 2024-03-15 DIAGNOSIS — R0602 Shortness of breath: Secondary | ICD-10-CM | POA: Diagnosis not present

## 2024-03-15 DIAGNOSIS — R000 Tachycardia, unspecified: Secondary | ICD-10-CM

## 2024-03-15 DIAGNOSIS — R931 Abnormal findings on diagnostic imaging of heart and coronary circulation: Secondary | ICD-10-CM

## 2024-03-15 DIAGNOSIS — I491 Atrial premature depolarization: Secondary | ICD-10-CM

## 2024-03-15 DIAGNOSIS — R079 Chest pain, unspecified: Secondary | ICD-10-CM | POA: Diagnosis not present

## 2024-03-15 DIAGNOSIS — R509 Fever, unspecified: Secondary | ICD-10-CM | POA: Diagnosis not present

## 2024-03-15 DIAGNOSIS — E876 Hypokalemia: Secondary | ICD-10-CM | POA: Diagnosis not present

## 2024-03-15 LAB — BASIC METABOLIC PANEL WITH GFR
Anion gap: 10 (ref 5–15)
BUN: 11 mg/dL (ref 8–23)
CO2: 26 mmol/L (ref 22–32)
Calcium: 10.2 mg/dL (ref 8.9–10.3)
Chloride: 104 mmol/L (ref 98–111)
Creatinine, Ser: 0.69 mg/dL (ref 0.44–1.00)
GFR, Estimated: >60 mL/min (ref >60)
Glucose, Bld: 98 mg/dL (ref 70–99)
Potassium: 4.5 mmol/L (ref 3.5–5.1)
Sodium: 140 mmol/L (ref 135–145)

## 2024-03-15 LAB — CBC
HCT: 39 % (ref 36.0–46.0)
Hemoglobin: 12.5 g/dL (ref 12.0–15.0)
MCH: 26.1 pg (ref 26.0–34.0)
MCHC: 32.1 g/dL (ref 30.0–36.0)
MCV: 81.4 fL (ref 80.0–100.0)
Platelets: 283 10*3/uL (ref 150–400)
RBC: 4.79 MIL/uL (ref 3.87–5.11)
RDW: 13 % (ref 11.5–15.5)
WBC: 4.8 10*3/uL (ref 4.0–10.5)
nRBC: 0 % (ref 0.0–0.2)

## 2024-03-15 LAB — MAGNESIUM: Magnesium: 1.8 mg/dL (ref 1.7–2.4)

## 2024-03-15 MED ORDER — MAGNESIUM SULFATE 2 GM/50ML IV SOLN
2.0000 g | Freq: Once | INTRAVENOUS | Status: AC
  Administered 2024-03-15: 2 g via INTRAVENOUS
  Filled 2024-03-15: qty 50

## 2024-03-15 MED ORDER — METOPROLOL TARTRATE 5 MG/5ML IV SOLN
5.0000 mg | Freq: Once | INTRAVENOUS | Status: DC
  Filled 2024-03-15: qty 5

## 2024-03-15 MED ORDER — PROPRANOLOL HCL 10 MG PO TABS
10.0000 mg | ORAL_TABLET | Freq: Two times a day (BID) | ORAL | Status: DC
  Administered 2024-03-15 – 2024-03-16 (×3): 10 mg via ORAL
  Filled 2024-03-15 (×3): qty 1

## 2024-03-15 NOTE — Progress Notes (Signed)
 Patient found near the elevator, was trying to go out for smoke. RN got her back in the room. Asked to offer something help with. She refused. Made the provider aware

## 2024-03-15 NOTE — Plan of Care (Signed)
  Problem: Education: Goal: Knowledge of General Education information will improve Description: Including pain rating scale, medication(s)/side effects and non-pharmacologic comfort measures Outcome: Progressing   Problem: Health Behavior/Discharge Planning: Goal: Ability to manage health-related needs will improve Outcome: Progressing   Problem: Clinical Measurements: Goal: Will remain free from infection Outcome: Progressing Goal: Respiratory complications will improve Outcome: Progressing   Problem: Activity: Goal: Risk for activity intolerance will decrease Outcome: Progressing   Problem: Nutrition: Goal: Adequate nutrition will be maintained Outcome: Progressing   Problem: Coping: Goal: Level of anxiety will decrease Outcome: Progressing   Problem: Elimination: Goal: Will not experience complications related to bowel motility Outcome: Progressing Goal: Will not experience complications related to urinary retention Outcome: Progressing   Problem: Pain Managment: Goal: General experience of comfort will improve and/or be controlled Outcome: Progressing   Problem: Safety: Goal: Ability to remain free from injury will improve Outcome: Progressing   Problem: Skin Integrity: Goal: Risk for impaired skin integrity will decrease Outcome: Progressing

## 2024-03-15 NOTE — Plan of Care (Signed)

## 2024-03-15 NOTE — Progress Notes (Signed)
 PROGRESS NOTE    Shari [name]  OZH:[id number] DOB: January 11, 1956 DOA: 03/13/2024 PCP: Jamey Reas, PA-C   Chief Complaint  Patient presents with   Shortness of Breath   Chest Pain    Brief Narrative:  Patient is a 68 year old female with homelessness, Graves' disease/hyperthyroidism off medication for the past 8 months, schizoaffective disorder, postmenopausal bleeding, ? paroxysmal A-fib not on chronic anticoagulation, GERD, dyslipidemia, hypertension presenting to the ED with shortness of breath and substernal chest pain nonradiating. Patient initially on presentation noted to be tachycardic, BP of 149/81, noted to be febrile on presentation to triage with a temp of 101.8. Patient noted not to be hypoxic. Initial EKG with A-fib with RVR of 141 and spontaneously converted to normal sinus rhythm. Patient noted to have frequent PACs. Patient also with electrolyte abnormalities.    Assessment & Plan:   Principal Problem:   Hypokalemia Active Problems:   Hypomagnesemia   Chest pain   Shortness of breath   Fever   Hyperlipidemia   Sinus tachycardia   PAC (premature atrial contraction)   Abnormal echocardiogram  #1 fever/chest pain/shortness of breath/abnormal 2D echo -??  Etiology. -Chest x-ray done on admission negative, COVID-19 PCR negative, influenza AMB PCR negative, RSV PCR negative. -Urinalysis pending. -Blood cultures ordered and pending with no growth to date. -Chest pain atypical in nature, EKG with no ischemic changes noted, cardiac enzymes negative as well as D-dimer. -2D echo obtained with a EF of 45 to 50%, left ventricular global hypokinesis.  Mildly dilated left atrial size.  Mild MVR.   -Due to abnormal 2D echo, cardiology was consulted.  -Patient seen in consultation by cardiology who recommended starting patient on losartan 25 mg daily and recommended changing atenolol to propranolol with outpatient follow-up with cardiology.  -Patient currently afebrile,  no leukocytosis, supportive care.    2.  Graves' disease/hyperthyroidism -TSH obtained <0.010, free T4 of 212 consistent with severe hypothyroidism. -Patient with no signs of acute thyrotoxicosis however on presentation also noted to be in A-fib with RVR transiently. -Patient noted to have been off all her medications including Tapazole for over 8 months. -Continue Tapazole. -Change atenolol to propranolol 10 mg p.o. twice daily for rate control and also in reducing conversion of free T4 to T3.. -Will need outpatient follow-up with her endocrinologist for further management.  3.  sinus tachycardia/frequent PACs -Change atenolol to propranolol 10 mg twice daily. -Continue management of hyperthyroidism. -Patient seen by cardiology who reviewed telemetry and feel patient has a sinus tachycardia with frequent PACs and not in atrial fibrillation or atrial flutter. -Outpatient follow-up with cardiology.   4.  Hypokalemia/hypomagnesemia -Repleted. -Magnesium at 1.8. -Repeat labs in the AM.   5.  Schizoaffective disorder -Per med rec patient supposed to be taking Zyprexa and Depakote and had previously been on trazodone however patient states has not been on any medication for over the past 8 months. -Continue Depakote 500 mg nightly, Zyprexa 10 mg nightly. -Psychiatry consulted and assessed the patient and recommended continuation of Zyprexa and Depakote at current doses with outpatient follow-up.    6.  Hypertension -Change atenolol to propranolol.   7.  Hyperlipidemia -It appears patient has not taken Lipitor since 2023. -Fasting lipid panel with a LDL of 73. -Continue lifestyle modification.   8.  Tobacco abuse -Tobacco cessation. -Patient states unable to tolerate nicotine patch and at this time declining nicotine gum.   DVT prophylaxis: Lovenox Code Status: Full Family Communication: Updated patient.  No family at  bedside. Disposition: Patient noted to be homeless.  TOC  consulted.  TBD  Status is: Observation The patient remains OBS appropriate and will d/c before 2 midnights.   Consultants:  Psychiatry Cardiology  Procedures:  Chest x-ray 03/13/2024 2D echo 03/13/2024 Thyroid ultrasound 03/13/2024  Antimicrobials:  Anti-infectives (From admission, onward)    None         Subjective: Sitting up in bed.  Denies any chest pain.  Denies any ongoing shortness of breath however states from time to time has some intermittent shortness of breath.  Denies any abdominal pain.  Tolerating current diet.    Objective: Vitals:   03/15/24 0800 03/15/24 0900 03/15/24 1233 03/15/24 1642  BP: (!) 113/54 117/69 130/60 126/78  Pulse:  64 (!) 43 97  Resp:      Temp:    98.1 F (36.7 C)  TempSrc:    Oral  SpO2:  100% 95% 100%  Weight:      Height:       No intake or output data in the 24 hours ending 03/15/24 1743 Filed Weights   03/13/24 0108  Weight: 45.4 kg    Examination:  General exam: NAD. Respiratory system: CTAB.  No wheezes, no crackles, no rhonchi.  Fair air movement.  Speaking in full sentences.   Cardiovascular system: Irregularly irregular.  No JVD, no murmurs rubs or gallops.  No lower extremity edema.  DVT ruled Gastrointestinal system: Abdomen is soft, nontender, nondistended, positive bowel sounds.  No rebound.  No guarding.  Extremities: Symmetric 5 x 5 power. Skin: No rashes, lesions or ulcers Psychiatry: Judgement and insight appear normal. Mood & affect appropriate.     Data Reviewed: I have personally reviewed following labs and imaging studies  CBC: Recent Labs  Lab 03/13/24 0124 03/14/24 0631 03/15/24 0726  WBC 5.4 6.2 4.8  HGB 11.1* 11.1* 12.5  HCT 35.2* 35.2* 39.0  MCV 81.9 82.2 81.4  PLT 251 262 283    Basic Metabolic Panel: Recent Labs  Lab 03/13/24 0124 03/13/24 0740 03/14/24 0631 03/15/24 0726  NA 139 141 140 140  K 2.7* 4.1 5.0 4.5  CL 106 110 107 104  CO2 20* 21* 22 26  GLUCOSE 123* 93 84 98   BUN 7* 6* 7* 11  CREATININE 0.56 0.44 0.65 0.69  CALCIUM 9.7 9.5 10.1 10.2  MG 1.3* 3.2* 1.6* 1.8    GFR: Estimated Creatinine Clearance: 48.9 mL/min (by C-G formula based on SCr of 0.69 mg/dL).  Liver Function Tests: Recent Labs  Lab 03/14/24 0631  AST 16  ALT 14  ALKPHOS 115  BILITOT 0.6  PROT 6.3*  ALBUMIN 3.0*    CBG: No results for input(s): "GLUCAP" in the last 168 hours.   Recent Results (from the past 240 hours)  Resp panel by RT-PCR (RSV, Flu A&B, Covid) Anterior Nasal Swab     Status: None   Collection Time: 03/13/24  7:29 AM   Specimen: Anterior Nasal Swab  Result Value Ref Range Status   SARS Coronavirus 2 by RT PCR NEGATIVE NEGATIVE Final   Influenza A by PCR NEGATIVE NEGATIVE Final   Influenza B by PCR NEGATIVE NEGATIVE Final    Comment: (NOTE) The Xpert Xpress SARS-CoV-2/FLU/RSV plus assay is intended as an aid in the diagnosis of influenza from Nasopharyngeal swab specimens and should not be used as a sole basis for treatment. Nasal washings and aspirates are unacceptable for Xpert Xpress SARS-CoV-2/FLU/RSV testing.  Fact Sheet for Patients: BloggerCourse.com  Fact Sheet  for Healthcare Providers: SeriousBroker.it  This test is not yet approved or cleared by the [place] and has been authorized for detection and/or diagnosis of SARS-CoV-2 by FDA under an Emergency Use Authorization (EUA). This EUA will remain in effect (meaning this test can be used) for the duration of the COVID-19 declaration under Section 564(b)(1) of the Act, 21 U.S.C. section 360bbb-3(b)(1), unless the authorization is terminated or revoked.     Resp Syncytial Virus by PCR NEGATIVE NEGATIVE Final    Comment: (NOTE) Fact Sheet for Patients: BloggerCourse.com  Fact Sheet for Healthcare Providers: SeriousBroker.it  This test is not yet approved or cleared by the  [place] FDA and has been authorized for detection and/or diagnosis of SARS-CoV-2 by FDA under an Emergency Use Authorization (EUA). This EUA will remain in effect (meaning this test can be used) for the duration of the COVID-19 declaration under Section 564(b)(1) of the Act, 21 U.S.C. section 360bbb-3(b)(1), unless the authorization is terminated or revoked.  Performed at [facility], [address], [place], [place] [address]   Culture, blood (Routine X 2) w Reflex to ID Panel     Status: None (Preliminary result)   Collection Time: 03/13/24  8:00 AM   Specimen: BLOOD  Result Value Ref Range Status   Specimen Description BLOOD SITE NOT SPECIFIED  Final   Special Requests   Final    BOTTLES DRAWN AEROBIC ONLY Blood Culture results may not be optimal due to an inadequate volume of blood received in culture bottles   Culture   Final    NO GROWTH 2 DAYS Performed at [facility], [address], [place], [place] [address]    Report Status PENDING  Incomplete  Culture, blood (Routine X 2) w Reflex to ID Panel     Status: None (Preliminary result)   Collection Time: 03/13/24  8:05 AM   Specimen: BLOOD  Result Value Ref Range Status   Specimen Description BLOOD SITE NOT SPECIFIED  Final   Special Requests   Final    BOTTLES DRAWN AEROBIC ONLY Blood Culture results may not be optimal due to an inadequate volume of blood received in culture bottles   Culture   Final    NO GROWTH 2 DAYS Performed at [facility], [address], [place], [place] [address]    Report Status PENDING  Incomplete  Urine Culture (for pregnant, neutropenic or urologic patients or patients with an indwelling urinary catheter)     Status: Abnormal   Collection Time: 03/13/24 11:51 AM   Specimen: Urine, Clean Catch  Result Value Ref Range Status   Specimen Description URINE, CLEAN CATCH  Final   Special Requests NONE  Final   Culture (A)  Final    20,000 COLONIES/mL GROUP B  STREP(S.AGALACTIAE)ISOLATED TESTING AGAINST S. AGALACTIAE NOT ROUTINELY PERFORMED DUE TO PREDICTABILITY OF AMP/PEN/VAN SUSCEPTIBILITY. Performed at [facility], [address], [place], [place] [address]    Report Status 03/14/2024 FINAL  Final         Radiology Studies: No results found.       Scheduled Meds:  divalproex  500 mg Oral QHS   enoxaparin (LOVENOX) injection  30 mg Subcutaneous Daily   losartan  25 mg Oral Daily   methimazole  10 mg Oral Daily   OLANZapine  10 mg Oral QHS   propranolol  10 mg Oral BID   sodium chloride flush  3 mL Intravenous Q12H  Continuous Infusions:   LOS: 0 days    Time spent: 40 minutes    Ramiro Harvest, MD [facility]   To contact the attending provider between 7A-7P or the covering provider during after hours 7P-7A, please log into the web site [email or web address] and access using universal [place] password for that web site. If you do not have the password, please call the hospital operator.  03/15/2024, 5:43 PM

## 2024-03-16 ENCOUNTER — Other Ambulatory Visit (HOSPITAL_COMMUNITY): Payer: Self-pay

## 2024-03-16 DIAGNOSIS — E785 Hyperlipidemia, unspecified: Secondary | ICD-10-CM | POA: Diagnosis not present

## 2024-03-16 DIAGNOSIS — E876 Hypokalemia: Secondary | ICD-10-CM | POA: Diagnosis not present

## 2024-03-16 DIAGNOSIS — R931 Abnormal findings on diagnostic imaging of heart and coronary circulation: Secondary | ICD-10-CM | POA: Diagnosis not present

## 2024-03-16 DIAGNOSIS — R079 Chest pain, unspecified: Secondary | ICD-10-CM | POA: Diagnosis not present

## 2024-03-16 MED ORDER — OLANZAPINE 10 MG PO TABS
10.0000 mg | ORAL_TABLET | Freq: Every day | ORAL | 1 refills | Status: AC
  Filled 2024-03-16: qty 30, 30d supply, fill #0

## 2024-03-16 MED ORDER — PANTOPRAZOLE SODIUM 40 MG PO TBEC
40.0000 mg | DELAYED_RELEASE_TABLET | Freq: Every day | ORAL | 0 refills | Status: AC
Start: 2024-03-16 — End: ?
  Filled 2024-03-16: qty 30, 30d supply, fill #0

## 2024-03-16 MED ORDER — METHIMAZOLE 10 MG PO TABS
10.0000 mg | ORAL_TABLET | Freq: Every day | ORAL | 1 refills | Status: AC
  Filled 2024-03-16: qty 30, 30d supply, fill #0

## 2024-03-16 MED ORDER — LOSARTAN POTASSIUM 25 MG PO TABS
25.0000 mg | ORAL_TABLET | Freq: Every day | ORAL | 1 refills | Status: AC
  Filled 2024-03-16: qty 30, 30d supply, fill #0

## 2024-03-16 MED ORDER — DIVALPROEX SODIUM 500 MG PO DR TAB
500.0000 mg | DELAYED_RELEASE_TABLET | Freq: Every day | ORAL | 1 refills | Status: AC
  Filled 2024-03-16: qty 30, 30d supply, fill #0

## 2024-03-16 MED ORDER — PROPRANOLOL HCL 10 MG PO TABS
10.0000 mg | ORAL_TABLET | Freq: Two times a day (BID) | ORAL | 1 refills | Status: AC
  Filled 2024-03-16: qty 60, 30d supply, fill #0

## 2024-03-16 MED ORDER — ONDANSETRON HCL 4 MG PO TABS
4.0000 mg | ORAL_TABLET | Freq: Three times a day (TID) | ORAL | 0 refills | Status: DC | PRN
  Filled 2024-03-16: qty 12, 4d supply, fill #0

## 2024-03-16 NOTE — Discharge Summary (Signed)
 Physician Discharge Summary  Shari [name] QMV:[id number] DOB: [birth year] DOA: 03/13/2024  PCP: Chares Commons, PA-C  Admit date: 03/13/2024 Discharge date: 03/16/2024  Time spent: 60 minutes  Recommendations for Outpatient Follow-up:  Follow-up with Chares Commons, PA-C in 1 to 2 weeks.  On follow-up patient will need a basic metabolic profile, magnesium level done to follow-up on electrolytes and renal function.  Hyperthyroidism will need to be followed up upon.  Patient may benefit from referral to endocrinology for further follow-up on her hyperthyroidism. Follow-up with Dr. Maximo Spar, cardiology in 3 weeks. [facility]   Discharge Diagnoses:  Principal Problem:   Hypokalemia Active Problems:   Hypomagnesemia   Chest pain   Shortness of breath   Fever   Hyperlipidemia   Sinus tachycardia   PAC (premature atrial contraction)   Abnormal echocardiogram   Discharge Condition: Stable and improved.  Diet recommendation: Heart healthy  Filed Weights   03/13/24 0108  Weight: 45.4 kg    History of present illness:   Shari [name] is a 68 y.o. female with medical history significant of homelessness, Graves' disease/hyperthyroidism, schizoaffective disorder, postmenopausal bleeding and associated enlargement, paroxysmal atrial fibrillation not on chronic anticoagulation, GERD, dyslipidemia and hypertension.  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  She presented to to the ED from [facility] complaining of shortness of breath and substernal chest pain that did not radiate.  At rest she was minimally tachycardic with a heart rate of 110, mildly hypertensive with a BP of 149/81 and febrile with a temperature of 101.8 degrees.  She was not hypoxic.  Her chest x-ray was normal.  Initial EKG demonstrated atrial  fibrillation with ventricular response 141.  She has subsequently converted spontaneously to normal sinus rhythm and has frequent PACs.  Found to have moderate hypokalemia with potassium of 2.7 and hypomagnesemia with a magnesium of 1.3.  She has been given IV and oral replacement of these electrolytes by the EDP.  Hospitalist service was asked to see the patient to evaluate for admission.   Upon my evaluation of the patient she confirmed that she has been without her medications for greater than 3 months and possibly longer.  She has no awareness of her tachycardia.  She has not had any UTI type symptoms.  She has not had any sick contacts.  She has not had any runny nose, she states her cough is chronic.  She has not had any nausea vomiting or abdominal pain.  She reports her weight fluctuates.  Hospital Course:  #1 fever/chest pain/shortness of breath/abnormal 2D echo -??  Etiology. -Chest x-ray done on admission negative, COVID-19 PCR negative, influenza AMB PCR negative, RSV PCR negative. -Urinalysis pending. -Blood cultures ordered and pending with no growth to date. -Chest pain atypical in nature, EKG with no ischemic changes noted, cardiac enzymes negative as well as D-dimer. -2D echo obtained with a EF of 45 to 50%, left ventricular global hypokinesis.  Mildly dilated left atrial size.  Mild MVR.   -Due to abnormal 2D echo, cardiology was consulted.  -Patient seen in consultation by cardiology who recommended starting patient on losartan 25 mg daily and recommended changing atenolol to propranolol with outpatient follow-up with cardiology.  -Patient improved, remained in stable condition, remained afebrile, no leukocytosis. -Patient will be discharged in stable and improved condition.   2.  Graves' disease/hyperthyroidism -TSH obtained <0.010, free T4 of 212 consistent with severe hyperthyroidism. -Patient with no signs of acute thyrotoxicosis however on presentation also noted to be in ?  A-fib with RVR versus sinus tachycardia with frequent PACs transiently. -Patient noted to have been off all her medications including Tapazole for over 8 months. -Patient placed back on home regimen of Tapazole.   -Patient initially was placed on atenolol however subsequently changed to propranolol 10 mg p.o. twice daily for rate control and also in reducing conversion of free T4 to T3.. -Will need outpatient follow-up with endocrinologist for further management.   3.  sinus tachycardia/frequent PACs -Changed atenolol to propranolol 10 mg twice daily. -Continued management of hyperthyroidism. -Patient seen by cardiology who reviewed telemetry and feel patient has a sinus tachycardia with frequent PACs and not in atrial fibrillation or atrial flutter. -Outpatient follow-up with cardiology.   4.  Hypokalemia/hypomagnesemia -Repleted during the hospitalization.   5.  Schizoaffective disorder -Per med rec patient supposed to be taking Zyprexa and Depakote and had previously been on trazodone however patient states has not been on any medication for over the past 8 months. -Patient was placed back on her prior medication regimen of Depakote 500 mg nightly, Zyprexa 10 mg nightly. -Psychiatry consulted and assessed the patient and recommended continuation of Zyprexa and Depakote at current doses with outpatient follow-up.    6.  Hypertension -Atenolol was changed to propranolol to also aid with patient's hyperthyroidism.   -Outpatient follow-up with PCP.    7.  Hyperlipidemia -It appears patient has not taken Lipitor since 2023. -Fasting lipid panel with a LDL of 73. -Continue lifestyle modification.   8.  Tobacco abuse -Tobacco cessation. -Patient states unable to tolerate nicotine patch and at this time declined nicotine gum.      Procedures: Chest x-ray 03/13/2024 2D echo 03/13/2024 Thyroid ultrasound 03/13/2024  Consultations: Psychiatry Cardiology  Discharge Exam: Vitals:    03/16/24 0456 03/16/24 1000  BP: (!) 107/53 (!) 127/57  Pulse: 78 67  Resp: 17   Temp:    SpO2: 99% 97%    General: NAD Cardiovascular: RRR no murmurs rubs or gallops.  No JVD.  No lower extremity edema. Respiratory: Clear to auscultation bilaterally.  No wheezes, no crackles, no rhonchi.  Fair air movement.  Speaking in full sentences.  Discharge Instructions   Discharge Instructions     Diet - low sodium heart healthy   Complete by: As directed    Increase activity slowly   Complete by: As directed       Allergies as of 03/16/2024       Reactions   Aspirin Hives, Itching   Chocolate Hives, Itching   Cocoa Itching, Rash   Penicillins Hives, Itching, Swelling, Other (See Comments)   Has patient had a PCN reaction causing IMMEDIATE RASH, FACIAL/TONGUE/THROAT SWELLING, SOB, OR LIGHTHEADEDNESS WITH HYPOTENSION:  #  #  #  YES  #  #  #  Has patient had a PCN reaction causing severe rash involving mucus membranes or skin necrosis: No Has patient had a PCN reaction that required hospitalization No Has patient had a PCN reaction occurring within the last 10 years: No If all of the above answers are "NO", then may proceed with Cephalosporin use.   Sulfa Antibiotics Hives, Itching        Medication List     STOP taking these medications    atenolol 25 MG tablet Commonly known as: TENORMIN   atorvastatin 40 MG  tablet Commonly known as: LIPITOR   ibuprofen 800 MG tablet Commonly known as: ADVIL   lisinopril 10 MG tablet Commonly known as: ZESTRIL   potassium chloride SA 20 MEQ tablet Commonly known as: KLOR-CON M   traZODone 50 MG tablet Commonly known as: DESYREL       TAKE these medications    acetaminophen 500 MG tablet Commonly known as: TYLENOL Take 1,000 mg by mouth every 6 (six) hours as needed for mild pain (pain score 1-3) or moderate pain (pain score 4-6).   divalproex 500 MG DR tablet Commonly known as: DEPAKOTE Take 1 tablet (500 mg total) by  mouth at bedtime.   losartan 25 MG tablet Commonly known as: COZAAR Take 1 tablet (25 mg total) by mouth daily.   methimazole 10 MG tablet Commonly known as: TAPAZOLE Take 1 tablet (10 mg total) by mouth daily.   OLANZapine 10 MG tablet Commonly known as: ZYPREXA Take 1 tablet (10 mg total) by mouth at bedtime.   ondansetron 4 MG tablet Commonly known as: ZOFRAN Take 1 tablet (4 mg total) by mouth every 8 (eight) hours as needed for nausea or vomiting.   pantoprazole 40 MG tablet Commonly known as: PROTONIX Take 1 tablet (40 mg total) by mouth daily. What changed: when to take this   propranolol 10 MG tablet Commonly known as: INDERAL Take 1 tablet (10 mg total) by mouth 2 (two) times daily.       Allergies  Allergen Reactions   Aspirin Hives and Itching   Chocolate Hives and Itching   Cocoa Itching and Rash   Penicillins Hives, Itching, Swelling and Other (See Comments)    Has patient had a PCN reaction causing IMMEDIATE RASH, FACIAL/TONGUE/THROAT SWELLING, SOB, OR LIGHTHEADEDNESS WITH HYPOTENSION:  #  #  #  YES  #  #  #  Has patient had a PCN reaction causing severe rash involving mucus membranes or skin necrosis: No Has patient had a PCN reaction that required hospitalization No Has patient had a PCN reaction occurring within the last 10 years: No If all of the above answers are "NO", then may proceed with Cephalosporin use.    Sulfa Antibiotics Hives and Itching    Follow-up Information     [facility] Follow up on 04/24/2024.   Specialty: Behavioral Health Why: You have a follow up appointment scheduled for Wednesday May 21st at 2pm. Please arrive at 1:30pm to complete paperwork. Thank you! Contact office if need to reschedule. Contact information: [address] [place]  [address] [phone]        Chares Commons, PA-C Follow up.   Specialty: Physician Assistant Why: Call the office  and schedule a hospital follow up in the next 7-10 days. Contact information: [address] [place], [place] [address] [phone]         Hazle Lites, MD. Schedule an appointment as soon as possible for a visit in 3 week(s).   Specialty: Cardiology Contact information: [address] [place] [place] [address] [phone]                  The results of significant diagnostics from this hospitalization (including imaging, microbiology, ancillary and laboratory) are listed below for reference.    Significant Diagnostic Studies: US  THYROID Result Date: 03/15/2024 CLINICAL DATA:  98452 Hyperthyroidism 98452 EXAM: THYROID ULTRASOUND TECHNIQUE: Ultrasound examination of the thyroid gland and adjacent soft tissues was performed. COMPARISON:  None  Available. FINDINGS: Parenchymal Echotexture: Normal Isthmus: 3 mm Right lobe: 4.8 x 1.9 x 1.9 cm Left lobe: 4.4 x 1.7 x 1.9 cm _________________________________________________________ Estimated total number of nodules >/= 1 cm: 0 Number of spongiform nodules >/=  2 cm not described below (TR1): 0 Number of mixed cystic and solid nodules >/= 1.5 cm not described below (TR2): 0 _________________________________________________________ Normal thyroid echotexture and vascularity. Incidental left mid thyroid subcentimeter hypoechoic nodule measures only 6 mm. No follow-up or biopsy recommended. No additional significant thyroid abnormality. Prominent but benign-appearing regional lymph nodes noted. No bulky adenopathy. Largest lymph node posterior to the right thyroid midpole measures 13 mm in length but only 6 mm in short axis. IMPRESSION: No significant thyroid abnormality by ultrasound. The above is in keeping with the ACR TI-RADS recommendations - J Am Coll Radiol 2017;14:587-595. Electronically Signed   By: Melven Stable.  Shick M.D.   On: 03/15/2024 09:36   ECHOCARDIOGRAM COMPLETE Result Date: 03/13/2024    ECHOCARDIOGRAM REPORT   Patient  Name:   Shari [name] Date of Exam: 03/13/2024 Medical Rec #:  [id number]     Height:       64.0 in Accession #:    [id number]    Weight:       100.0 lb Date of Birth:  [birth year]     BSA:          1.457 m Patient Age:    67 years      BP:           129/65 mmHg Patient Gender: F             HR:           72 bpm. Exam Location:  Inpatient Procedure: 2D Echo, Cardiac Doppler and Color Doppler (Both Spectral and Color            Flow Doppler were utilized during procedure). Indications:    R07.9* Chest pain, unspecified; R06.02 SOB, Fever  History:        Patient has prior history of Echocardiogram examinations, most                 recent 07/12/2019. Arrythmias:Atrial Fibrillation; Risk                 Factors:Hypertension, Diabetes and Dyslipidemia.  Sonographer:    Sherline Distel Senior RDCS Referring Phys: Lovell Rubenstein IMPRESSIONS  1. Left ventricular ejection fraction, by estimation, is 45 to 50%. Left ventricular ejection fraction by 2D MOD biplane is 48.0 %. The left ventricle has mildly decreased function. The left ventricle demonstrates global hypokinesis. Left ventricular diastolic parameters are indeterminate.  2. Right ventricular systolic function is normal. The right ventricular size is normal. There is mildly elevated pulmonary artery systolic pressure. The estimated right ventricular systolic pressure is 44.6 mmHg.  3. Left atrial size was mildly dilated.  4. The mitral valve is normal in structure. Mild mitral valve regurgitation. No evidence of mitral stenosis.  5. The aortic valve is grossly normal. There is mild calcification of the aortic valve. Aortic valve regurgitation is mild. Aortic valve sclerosis is present, with no evidence of aortic valve stenosis.  6. The inferior vena cava is dilated in size with <50% respiratory variability, suggesting right atrial pressure of 15 mmHg. Comparison(s): Changes from prior study are noted. LVEF mildly reduced compared to prior from 2020 (EF 60-65%). Mild global  hypokinesis. FINDINGS  Left Ventricle: Left ventricular ejection fraction, by estimation, is 45 to 50%. Left ventricular ejection  fraction by 2D MOD biplane is 48.0 %. The left ventricle has mildly decreased function. The left ventricle demonstrates global hypokinesis. The left ventricular internal cavity size was normal in size. There is no left ventricular hypertrophy. Left ventricular diastolic parameters are indeterminate. Right Ventricle: The right ventricular size is normal. No increase in right ventricular wall thickness. Right ventricular systolic function is normal. There is mildly elevated pulmonary artery systolic pressure. The tricuspid regurgitant velocity is 2.72  m/s, and with an assumed right atrial pressure of 15 mmHg, the estimated right ventricular systolic pressure is 44.6 mmHg. Left Atrium: Left atrial size was mildly dilated. Right Atrium: Right atrial size was normal in size. Pericardium: There is no evidence of pericardial effusion. Mitral Valve: The mitral valve is normal in structure. There is mild thickening of the mitral valve leaflet(s). Mild mitral valve regurgitation. No evidence of mitral valve stenosis. Tricuspid Valve: The tricuspid valve is normal in structure. Tricuspid valve regurgitation is mild . No evidence of tricuspid stenosis. Aortic Valve: The aortic valve is grossly normal. There is mild calcification of the aortic valve. Aortic valve regurgitation is mild. Aortic valve sclerosis is present, with no evidence of aortic valve stenosis. Pulmonic Valve: The pulmonic valve was not well visualized. Pulmonic valve regurgitation is not visualized. No evidence of pulmonic stenosis. Aorta: The ascending aorta was not well visualized and the aortic root is normal in size and structure. Venous: The inferior vena cava is dilated in size with less than 50% respiratory variability, suggesting right atrial pressure of 15 mmHg. IAS/Shunts: No atrial level shunt detected by color flow  Doppler.  LEFT VENTRICLE PLAX 2D                        Biplane EF (MOD) LVIDd:         4.70 cm         LV Biplane EF:   Left LVIDs:         3.60 cm                          ventricular LV PW:         0.90 cm                          ejection LV IVS:        0.70 cm                          fraction by LVOT diam:     1.80 cm                          2D MOD LV SV:         58                               biplane is LV SV Index:   40                               48.0 %. LVOT Area:     2.54 cm  LV Volumes (MOD) LV vol d, MOD    130.0 ml A2C: LV vol d, MOD    100.0 ml A4C: LV vol s, MOD  69.1 ml A2C: LV vol s, MOD    50.3 ml A4C: LV SV MOD A2C:   60.9 ml LV SV MOD A4C:   100.0 ml LV SV MOD BP:    54.7 ml RIGHT VENTRICLE RV S prime:     11.70 cm/s TAPSE (M-mode): 2.1 cm LEFT ATRIUM             Index        RIGHT ATRIUM           Index LA diam:        3.00 cm 2.06 cm/m   RA Area:     17.20 cm LA Vol (A2C):   58.6 ml 40.25 ml/m  RA Volume:   47.10 ml  32.32 ml/m LA Vol (A4C):   40.9 ml 28.07 ml/m LA Biplane Vol: 56.5 ml 38.77 ml/m  AORTIC VALVE LVOT Vmax:   131.00 cm/s LVOT Vmean:  87.800 cm/s LVOT VTI:    0.229 m  AORTA Ao Root diam: 2.60 cm TRICUSPID VALVE TR Peak grad:   29.6 mmHg TR Vmax:        272.00 cm/s  SHUNTS Systemic VTI:  0.23 m Systemic Diam: 1.80 cm Sheryle Donning MD Electronically signed by Sheryle Donning MD Signature Date/Time: 03/13/2024/9:51:48 PM    Final    DG Chest Port 1 View Result Date: 03/13/2024 CLINICAL DATA:  cp EXAM: PORTABLE CHEST 1 VIEW COMPARISON:  Chest x-ray 11/03/2022 FINDINGS: The heart and mediastinal contours are within normal limits. No focal consolidation. No pulmonary edema. No pleural effusion. No pneumothorax. No acute osseous abnormality. IMPRESSION: No active disease. Electronically Signed   By: Morgane  Naveau M.D.   On: 03/13/2024 03:47    Microbiology: Recent Results (from the past 240 hours)  Resp panel by RT-PCR (RSV, Flu A&B, Covid) Anterior  Nasal Swab     Status: None   Collection Time: 03/13/24  7:29 AM   Specimen: Anterior Nasal Swab  Result Value Ref Range Status   SARS Coronavirus 2 by RT PCR NEGATIVE NEGATIVE Final   Influenza A by PCR NEGATIVE NEGATIVE Final   Influenza B by PCR NEGATIVE NEGATIVE Final    Comment: (NOTE) The Xpert Xpress SARS-CoV-2/FLU/RSV plus assay is intended as an aid in the diagnosis of influenza from Nasopharyngeal swab specimens and should not be used as a sole basis for treatment. Nasal washings and aspirates are unacceptable for Xpert Xpress SARS-CoV-2/FLU/RSV testing.  Fact Sheet for Patients: BloggerCourse.com  Fact Sheet for Healthcare Providers: SeriousBroker.it  This test is not yet approved or cleared by the [place]  FDA and has been authorized for detection and/or diagnosis of SARS-CoV-2 by FDA under an Emergency Use Authorization (EUA). This EUA will remain in effect (meaning this test can be used) for the duration of the COVID-19 declaration under Section 564(b)(1) of the Act, 21 U.S.C. section 360bbb-3(b)(1), unless the authorization is terminated or revoked.     Resp Syncytial Virus by PCR NEGATIVE NEGATIVE Final    Comment: (NOTE) Fact Sheet for Patients: BloggerCourse.com  Fact Sheet for Healthcare Providers: SeriousBroker.it  This test is not yet approved or cleared by the [place]  FDA and has been authorized for detection and/or diagnosis of SARS-CoV-2 by FDA under an Emergency Use Authorization (EUA). This EUA will remain in effect (meaning this test can be used) for the duration of the COVID-19 declaration under Section 564(b)(1) of the Act, 21 U.S.C. section 360bbb-3(b)(1), unless the authorization is terminated or revoked.  Performed at [facility], [address] [place], [place] [address]   Culture, blood (Routine X 2) w Reflex to ID Panel      Status: None (Preliminary result)   Collection Time: 03/13/24  8:00 AM   Specimen: BLOOD  Result Value Ref Range Status   Specimen Description BLOOD SITE NOT SPECIFIED  Final   Special Requests   Final    BOTTLES DRAWN AEROBIC ONLY Blood Culture results may not be optimal due to an inadequate volume of blood received in culture bottles   Culture   Final    NO GROWTH 3 DAYS Performed at [facility], [address] [place], [place] [address]    Report Status PENDING  Incomplete  Culture, blood (Routine X 2) w Reflex to ID Panel     Status: None (Preliminary result)   Collection Time: 03/13/24  8:05 AM   Specimen: BLOOD  Result Value Ref Range Status   Specimen Description BLOOD SITE NOT SPECIFIED  Final   Special Requests   Final    BOTTLES DRAWN AEROBIC ONLY Blood Culture results may not be optimal due to an inadequate volume of blood received in culture bottles   Culture   Final    NO GROWTH 3 DAYS Performed at [facility], [address], [place], [place] [address]    Report Status PENDING  Incomplete  Urine Culture (for pregnant, neutropenic or urologic patients or patients with an indwelling urinary catheter)     Status: Abnormal   Collection Time: 03/13/24 11:51 AM   Specimen: Urine, Clean Catch  Result Value Ref Range Status   Specimen Description URINE, CLEAN CATCH  Final   Special Requests NONE  Final   Culture (A)  Final    20,000 COLONIES/mL GROUP B STREP(S.AGALACTIAE)ISOLATED TESTING AGAINST S. AGALACTIAE NOT ROUTINELY PERFORMED DUE TO PREDICTABILITY OF AMP/PEN/VAN SUSCEPTIBILITY. Performed at [facility], [address] [place], [place] [address]    Report Status 03/14/2024 FINAL  Final     Labs: Basic Metabolic Panel: Recent Labs  Lab 03/13/24 0124 03/13/24 0740 03/14/24 0631 03/15/24 0726  NA 139 141 140 140  K 2.7* 4.1 5.0 4.5  CL 106 110 107 104  CO2 20* 21* 22 26  GLUCOSE 123* 93 84 98  BUN 7* 6* 7* 11  CREATININE 0.56  0.44 0.65 0.69  CALCIUM 9.7 9.5 10.1 10.2  MG 1.3* 3.2* 1.6* 1.8   Liver Function Tests: Recent Labs  Lab 03/14/24 0631  AST 16  ALT 14  ALKPHOS 115  BILITOT 0.6  PROT 6.3*  ALBUMIN 3.0*   No results for input(s): "LIPASE", "AMYLASE" in the last 168 hours. No results for input(s): "AMMONIA" in the last 168 hours. CBC: Recent Labs  Lab 03/13/24 0124 03/14/24 0631 03/15/24 0726  WBC 5.4 6.2 4.8  HGB 11.1* 11.1* 12.5  HCT 35.2* 35.2* 39.0  MCV 81.9 82.2 81.4  PLT 251 262 283   Cardiac Enzymes: No results for input(s): "CKTOTAL", "CKMB", "CKMBINDEX", "TROPONINI" in the last 168 hours. BNP: BNP (last 3 results) Recent Labs    03/13/24 0125  BNP 111.0*    ProBNP (last 3 results) No results for input(s): "PROBNP" in the last 8760 hours.  CBG: No results for input(s): "GLUCAP" in the last 168 hours.     Signed:  Hilda Lovings MD.  [facility] 03/16/2024, 11:14 AM

## 2024-03-16 NOTE — Plan of Care (Signed)

## 2024-03-18 LAB — CULTURE, BLOOD (ROUTINE X 2)
Culture: NO GROWTH
Culture: NO GROWTH

## 2024-05-01 ENCOUNTER — Ambulatory Visit (HOSPITAL_COMMUNITY): Payer: Self-pay | Admitting: Psychiatry

## 2024-10-14 ENCOUNTER — Ambulatory Visit (HOSPITAL_COMMUNITY)
Admission: EM | Admit: 2024-10-14 | Discharge: 2024-10-17 | Disposition: A | Discharge status: Other Acute Inpatient Hospital

## 2024-10-14 DIAGNOSIS — E039 Hypothyroidism, unspecified: Secondary | ICD-10-CM | POA: Insufficient documentation

## 2024-10-14 DIAGNOSIS — F200 Paranoid schizophrenia: Secondary | ICD-10-CM | POA: Insufficient documentation

## 2024-10-14 DIAGNOSIS — G4700 Insomnia, unspecified: Secondary | ICD-10-CM | POA: Diagnosis not present

## 2024-10-14 DIAGNOSIS — T43506A Underdosing of unspecified antipsychotics and neuroleptics, initial encounter: Secondary | ICD-10-CM | POA: Insufficient documentation

## 2024-10-14 DIAGNOSIS — Z79899 Other long term (current) drug therapy: Secondary | ICD-10-CM | POA: Insufficient documentation

## 2024-10-14 DIAGNOSIS — Z046 Encounter for general psychiatric examination, requested by authority: Secondary | ICD-10-CM

## 2024-10-14 DIAGNOSIS — F259 Schizoaffective disorder, unspecified: Secondary | ICD-10-CM | POA: Diagnosis present

## 2024-10-14 LAB — CBC WITH DIFFERENTIAL/PLATELET
Abs Immature Granulocytes: 0.01 K/uL (ref 0.00–0.07)
Basophils Absolute: 0 K/uL (ref 0.0–0.1)
Basophils Relative: 0 %
Eosinophils Absolute: 0 K/uL (ref 0.0–0.5)
Eosinophils Relative: 0 %
HCT: 36.9 % (ref 36.0–46.0)
Hemoglobin: 11.6 g/dL — ABNORMAL LOW (ref 12.0–15.0)
Immature Granulocytes: 0 %
Lymphocytes Relative: 42 %
Lymphs Abs: 2.3 K/uL (ref 0.7–4.0)
MCH: 25.9 pg — ABNORMAL LOW (ref 26.0–34.0)
MCHC: 31.4 g/dL (ref 30.0–36.0)
MCV: 82.4 fL (ref 80.0–100.0)
Monocytes Absolute: 0.4 K/uL (ref 0.1–1.0)
Monocytes Relative: 7 %
Neutro Abs: 2.7 K/uL (ref 1.7–7.7)
Neutrophils Relative %: 51 %
Platelets: 311 K/uL (ref 150–400)
RBC: 4.48 MIL/uL (ref 3.87–5.11)
RDW: 14.2 % (ref 11.5–15.5)
WBC: 5.4 K/uL (ref 4.0–10.5)
nRBC: 0 % (ref 0.0–0.2)

## 2024-10-14 LAB — COMPREHENSIVE METABOLIC PANEL WITH GFR
ALT: 15 U/L (ref 0–44)
AST: 18 U/L (ref 15–41)
Albumin: 3.4 g/dL — ABNORMAL LOW (ref 3.5–5.0)
Alkaline Phosphatase: 209 U/L — ABNORMAL HIGH (ref 38–126)
Anion gap: 14 (ref 5–15)
BUN: <5 mg/dL — ABNORMAL LOW (ref 8–23)
CO2: 25 mmol/L (ref 22–32)
Calcium: 10 mg/dL (ref 8.9–10.3)
Chloride: 105 mmol/L (ref 98–111)
Creatinine, Ser: 0.51 mg/dL (ref 0.44–1.00)
GFR, Estimated: >60 mL/min (ref >60)
Glucose, Bld: 83 mg/dL (ref 70–99)
Potassium: 3.8 mmol/L (ref 3.5–5.1)
Sodium: 144 mmol/L (ref 135–145)
Total Bilirubin: 0.6 mg/dL (ref 0.0–1.2)
Total Protein: 7.4 g/dL (ref 6.5–8.1)

## 2024-10-14 LAB — ETHANOL: Alcohol, Ethyl (B): <15 mg/dL (ref <15)

## 2024-10-14 LAB — MAGNESIUM: Magnesium: 1.6 mg/dL — ABNORMAL LOW (ref 1.7–2.4)

## 2024-10-14 LAB — HEMOGLOBIN A1C
Hgb A1c MFr Bld: 5.5 % (ref 4.8–5.6)
Mean Plasma Glucose: 111.15 mg/dL

## 2024-10-14 LAB — LIPID PANEL
Cholesterol: 167 mg/dL (ref 0–200)
HDL: 60 mg/dL (ref >40)
LDL Cholesterol: 93 mg/dL (ref 0–99)
Total CHOL/HDL Ratio: 2.8 ratio
Triglycerides: 68 mg/dL (ref <150)
VLDL: 14 mg/dL (ref 0–40)

## 2024-10-14 LAB — TSH: TSH: <0.1 u[IU]/mL — ABNORMAL LOW (ref 0.350–4.500)

## 2024-10-14 LAB — HIV ANTIBODY (ROUTINE TESTING W REFLEX): HIV Screen 4th Generation wRfx: NONREACTIVE

## 2024-10-14 MED ORDER — ALUM & MAG HYDROXIDE-SIMETH 200-200-20 MG/5ML PO SUSP: 30.0000 mL | ORAL | Status: DC | PRN

## 2024-10-14 MED ORDER — TRAZODONE HCL 50 MG PO TABS
50.0000 mg | ORAL_TABLET | Freq: Every evening | ORAL | Status: DC | PRN
  Administered 2024-10-15 – 2024-10-16 (×2): 50 mg via ORAL
  Filled 2024-10-14 (×2): qty 1

## 2024-10-14 MED ORDER — OLANZAPINE 5 MG PO TBDP: 5.0000 mg | ORAL_TABLET | Freq: Three times a day (TID) | ORAL | Status: DC | PRN

## 2024-10-14 MED ORDER — OLANZAPINE 10 MG IM SOLR: 5.0000 mg | Freq: Three times a day (TID) | INTRAMUSCULAR | Status: DC | PRN

## 2024-10-14 MED ORDER — MAGNESIUM HYDROXIDE 400 MG/5ML PO SUSP: 30.0000 mL | Freq: Every day | ORAL | Status: DC | PRN

## 2024-10-14 MED ORDER — CLONIDINE HCL 0.1 MG PO TABS: 0.1000 mg | ORAL_TABLET | Freq: Two times a day (BID) | ORAL | Status: DC | PRN

## 2024-10-14 MED ORDER — ACETAMINOPHEN 325 MG PO TABS
650.0000 mg | ORAL_TABLET | Freq: Four times a day (QID) | ORAL | Status: DC | PRN
  Administered 2024-10-15: 650 mg via ORAL
  Filled 2024-10-14: qty 2

## 2024-10-14 NOTE — Progress Notes (Signed)
   10/14/24 1319  [facility] Triage Screening (Walk-ins at [facility] only)  What Is the Reason for Your Visit/Call Today? Jeni presents to [facility] under IVC. Pt states she doesn not know why she is here. Pt denies SI, HI, AVH, Drug use. Per IVC: Pt is diagnosed with Schizophrenia and refusing medications. pt isnt sleeping, up all night yelling. pt is resonding to internal stimuli. pt is verbally aggressive. Pt has locked asll residents outside of home.  How Long Has This Been Causing You Problems? <Week  Have You Recently Had Any Thoughts About Hurting Yourself? No  Are You Planning to Commit Suicide/Harm Yourself At This time? No  Have you Recently Had Thoughts About Hurting Someone [name]? No  Are You Planning To Harm Someone At This Time? No  Are you currently experiencing any auditory, visual or other hallucinations? No  Have You Used Any Alcohol or Drugs in the Past 24 Hours? Yes  What Did You Use and How Much? 3 beers last night  Do you have any current medical co-morbidities that require immediate attention? No  What Do You Feel Would Help You the Most Today? Treatment for Depression or other mood problem  Determination of Need Urgent (48 hours)  Options For Referral Inpatient Hospitalization;Intensive Outpatient Therapy;Medication Management;BH Urgent Care;Therapeutic Triage Services;Partial Hospitalization;Outpatient Therapy  Determination of Need filed? Yes

## 2024-10-14 NOTE — ED Provider Notes (Signed)
 [facility] Urgent Care Continuous Assessment Admission H&P  Date: 10/14/24 Patient Name: Shari [name] MRN: [id number] Chief Complaint: Delusional thinking  Diagnoses:  Final diagnoses:  Schizophrenia, paranoid [facility])   HPI: Shari [name] is a 68 y.o. female with a prior psychiatry history of paranoid schizophrenia, as per documentation from [facility] on 05/25/2022, who presents to the [facility] accompanied by her case manager with complaints of delusional thinking.  Patient petitioned by case manager, and as per IVC petition:  [name] has been diagnosed with schizophrenia and is refusing to medication. [name] isn't sleeping.  Residents reported her screaming and yelling all night long. [name] responding to internal stimuli. [name] very aggressive verbally. The respondent has locked all of the residents inside of the transitional housing home.  They could not leave the home which prompted the IVC.  Residence did not have access to food, they restroom or anything as a result of being locked in. [name] is currently on the adult protective services care.  Assessment: Patient is seen during encounter with [facility] counselor, and presents irritable, presents with delusions of persecution, presents with paranoid ideation: Persistently stating that Rutha Jama Molt resides at her home, states that she owns the home, whereas she resides at a transitional housing home.  Reports that she has resided at the home since 68 years old, denies having any mental health problems, but has been diagnosed with schizophrenia in the past.  She also admits to drinking 3 beers yesterday, but in her current mental state, might be drinking more.  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  Patient escalating and irritability, refusing to voluntarily comply with getting treatment for current mental status.  States that she will  not take any medications for her mental health.  Denies symptoms consistent with other mental health conditions, but as per her case manager, patient has not been sleeping, has been exhibiting aggressive behaviors towards her peers at the transitional housing.  Patient is not cooperative with the rest of the assessment, and asking to be discharged.  She denies past suicide attempts, but again is a poor historian at this time, and also due to her current mental state, unable to provide an accurate history.  Due to the imminent risks of danger to her peers at the transitional housing, we are upholding her involuntary commitment, to enhance treatment and stabilization.  Please see note from [facility] counselor for a comprehensive history and patient.  Recommendations: We are recommending inpatient hospitalization for treatment and stabilization of mental status.  And patient's current mental state, she presents as a risk of danger to herself and others.  See complete orders below.  Total Time spent with patient: 1.5 hours  Musculoskeletal  Strength & Muscle Tone: within normal limits Gait & Station: normal Patient leans: N/A  Psychiatric Specialty Exam  Presentation General Appearance: Fairly Groomed  Eye Contact:Fair  Speech:Clear and Coherent  Speech Volume:Normal  Handedness:Right   Mood and Affect  Mood:Anxious; Depressed  Affect:Congruent   Thought Process  Thought Processes:Disorganized  Descriptions of Associations:Circumstantial  Orientation:Partial  Thought Content:Paranoid Ideation; Perseveration; Delusions; Illogical  Diagnosis of Schizophrenia or Schizoaffective disorder in past: Yes  Duration of Psychotic Symptoms: Greater than six months  Hallucinations:Hallucinations: None  Ideas of Reference:Percusatory  Suicidal Thoughts:Suicidal Thoughts: No  Homicidal Thoughts:Homicidal Thoughts: No   Sensorium  Memory:Recent  Poor  Judgment:Poor  Insight:Poor   Art Therapist  Concentration:Poor  Attention Span:Poor  Recall:Poor  Fund of Knowledge:Poor  Language:Fair   Psychomotor Activity  Psychomotor Activity:Psychomotor Activity: Normal   Assets  Assets:Resilience   Sleep  Sleep:Sleep: Poor   Nutritional Assessment (For OBS and FBC admissions only) Has the patient had a weight loss or gain of 10 pounds or more in the last 3 months?: No Has the patient had a decrease in food intake/or appetite?: No Does the patient have dental problems?: No Does the patient have eating habits or behaviors that may be indicators of an eating disorder including binging or inducing vomiting?: No Has the patient recently lost weight without trying?: 0 Has the patient been eating poorly because of a decreased appetite?: 0 Malnutrition Screening Tool Score: 0    Physical Exam Review of Systems  Psychiatric/Behavioral:  Positive for hallucinations. Negative for depression, memory loss, substance abuse and suicidal ideas. The patient is nervous/anxious and has insomnia.     Blood pressure (!) 164/64, pulse (!) 42, temperature 98.6 F (37 C), temperature source Oral, resp. rate 18, SpO2 98%. There is no height or weight on file to calculate BMI.  Past Psychiatric History: Schizophrenia  Is the patient at risk to self? Yes  Has the patient been a risk to self in the past 6 months? Yes .    Has the patient been a risk to self within the distant past? Yes   Is the patient a risk to others? Yes   Has the patient been a risk to others in the past 6 months? Yes   Has the patient been a risk to others within the distant past? unknown  Mental health, medical and social history  Homeless, but residing in transitional housing.  Hyperlipemia   Hypertension   Paranoid schizophrenia (HCC)   Pre-diabetes   Schizoaffective disorder, bipolar type (HCC)  Family History: Reports that she does not have any  family  Last Labs:  No visits with results within 6 Month(s) from this visit.  Latest known visit with results is:  Admission on 03/13/2024, Discharged on 03/16/2024  Component Date Value Ref Range Status   Sodium 03/13/2024 139  135 - 145 mmol/L Final   Potassium 03/13/2024 2.7 (LL)  3.5 - 5.1 mmol/L Final   CRITICAL RESULT CALLED TO, READ BACK BY AND VERIFIED WITH DORETHA RAMAN, RN 0207 03/13/2024 SANDOVAL K   Chloride 03/13/2024 106  98 - 111 mmol/L Final   CO2 03/13/2024 20 (L)  22 - 32 mmol/L Final   Glucose, Bld 03/13/2024 123 (H)  70 - 99 mg/dL Final   Glucose reference range applies only to samples taken after fasting for at least 8 hours.   BUN 03/13/2024 7 (L)  8 - 23 mg/dL Final   Creatinine, Ser 03/13/2024 0.56  0.44 - 1.00 mg/dL Final   Calcium  03/13/2024 9.7  8.9 - 10.3 mg/dL Final   GFR, Estimated 03/13/2024 >60  >60 mL/min Final   Comment: (NOTE) Calculated using the CKD-EPI Creatinine Equation (2021)    Anion gap 03/13/2024 13  5 - 15 Final   Performed at [facility] Lab, [address] [place], [place] [address]   WBC 03/13/2024 5.4  4.0 - 10.5 K/uL Final   RBC 03/13/2024 4.30  3.87 - 5.11 MIL/uL Final   Hemoglobin 03/13/2024 11.1 (L)  12.0 - 15.0 g/dL Final   HCT 95/90/7974 35.2 (L)  36.0 - 46.0 % Final   MCV 03/13/2024 81.9  80.0 - 100.0 fL Final   MCH  03/13/2024 25.8 (L)  26.0 - 34.0 pg Final   MCHC 03/13/2024 31.5  30.0 - 36.0 g/dL Final   RDW 95/90/7974 13.1  11.5 - 15.5 % Final   Platelets 03/13/2024 251  150 - 400 K/uL Final   nRBC 03/13/2024 0.0  0.0 - 0.2 % Final   Performed at [facility] Lab, [address], [place], [place] [address]   Troponin I (High Sensitivity) 03/13/2024 8  <18 ng/L Final   Comment: (NOTE) Elevated high sensitivity troponin I (hsTnI) values and significant  changes across serial measurements may suggest ACS but many other  chronic and acute conditions are known to elevate hsTnI results.  Refer to the Links section for  chest pain algorithms and additional  guidance. Performed at [facility], [address], [place], [place] [address]    B Natriuretic Peptide 03/13/2024 111.0 (H)  0.0 - 100.0 pg/mL Final   Performed at [facility], [address], [place], [place] [address]   D-Dimer, Quant 03/13/2024 0.30  0.00 - 0.50 ug/mL-FEU Final   Comment: (NOTE) At the manufacturer cut-off value of 0.5 g/mL FEU, this assay has a negative predictive value of 95-100%.This assay is intended for use in conjunction with a clinical pretest probability (PTP) assessment model to exclude pulmonary embolism (PE) and deep venous thrombosis (DVT) in outpatients suspected of PE or DVT. Results should be correlated with clinical presentation. Performed at [facility], [address] [place], [place] [address]    Magnesium  03/13/2024 1.3 (L)  1.7 - 2.4 mg/dL Final   Performed at [facility] Lab, [address], [place], [place] [address]   Troponin I (High Sensitivity) 03/13/2024 7  <18 ng/L Final   Comment: (NOTE) Elevated high sensitivity troponin I (hsTnI) values and significant  changes across serial measurements may suggest ACS but many other  chronic and acute conditions are known to elevate hsTnI results.  Refer to the Links section for chest pain algorithms and additional  guidance. Performed at [facility] Lab, [address], [place], [place] [address]    HIV Screen 4th Generation wRfx 03/13/2024 Non Reactive  Non Reactive Final   Performed at [facility], [address], [place], [place] [address]   Sodium, Ur 03/13/2024 144  mmol/L Final   Potassium Urine 03/13/2024 28  mmol/L Final   Performed at [facility], [address], [place], [place] [address]   Sodium 03/13/2024 141  135 - 145 mmol/L Final   Potassium 03/13/2024 4.1  3.5 - 5.1 mmol/L Final   Chloride 03/13/2024 110  98 - 111 mmol/L Final   CO2 03/13/2024 21 (L)  22 - 32 mmol/L Final   Glucose, Bld  03/13/2024 93  70 - 99 mg/dL Final   Glucose reference range applies only to samples taken after fasting for at least 8 hours.   BUN 03/13/2024 6 (L)  8 - 23 mg/dL Final   Creatinine, Ser 03/13/2024 0.44  0.44 - 1.00 mg/dL Final   Calcium  03/13/2024 9.5  8.9 - 10.3 mg/dL Final   GFR, Estimated 03/13/2024 >60  >60 mL/min Final   Comment: (NOTE) Calculated using the CKD-EPI Creatinine Equation (2021)    Anion gap 03/13/2024 10  5 - 15 Final   Performed at [facility] Lab, [address], [place], [place] [address]   Magnesium  03/13/2024 3.2 (H)  1.7 - 2.4 mg/dL Final   Performed at [facility] Lab, [address], [place], [place] [address]   TSH 03/13/2024 <0.010 (L)  0.350 - 4.500 uIU/mL Final   Comment: Performed by a 3rd Generation assay with a functional sensitivity of <=0.01 uIU/mL. Performed at [facility] Lab, [address] [place], [place] [address]    Free T4 03/13/2024 2.12 (H)  0.61 - 1.12 ng/dL Final   Comment: (NOTE) Biotin ingestion may interfere with free T4 tests. If the results are inconsistent with the TSH level, previous test results, or the clinical presentation, then consider biotin interference. If needed, order repeat testing after stopping biotin. Performed at [facility] Lab, [address], [place], [place] [address]    SARS Coronavirus 2 by RT PCR 03/13/2024 NEGATIVE  NEGATIVE Final   Influenza A by PCR 03/13/2024 NEGATIVE  NEGATIVE Final   Influenza B by PCR 03/13/2024 NEGATIVE  NEGATIVE Final   Comment: (NOTE) The Xpert Xpress SARS-CoV-2/FLU/RSV plus assay is intended as an aid in the diagnosis of influenza from Nasopharyngeal swab specimens and should not be used as a sole basis for treatment. Nasal washings and aspirates are unacceptable for Xpert Xpress SARS-CoV-2/FLU/RSV testing.  Fact Sheet for Patients: bloggercourse.com  Fact Sheet for Healthcare  Providers: seriousbroker.it  This test is not yet approved or cleared by the [place]  FDA and has been authorized for detection and/or diagnosis of SARS-CoV-2 by FDA under an Emergency Use Authorization (EUA). This EUA will remain in effect (meaning this test can be used) for the duration of the COVID-19 declaration under Section 564(b)(1) of the Act, 21 U.S.C. section 360bbb-3(b)(1), unless the authorization is terminated or revoked.     Resp Syncytial Virus by PCR 03/13/2024 NEGATIVE  NEGATIVE Final   Comment: (NOTE) Fact Sheet for Patients: bloggercourse.com  Fact Sheet for Healthcare Providers: seriousbroker.it  This test is not yet approved or cleared by the [place]  FDA and has been authorized for detection and/or diagnosis of SARS-CoV-2 by FDA under an Emergency Use Authorization (EUA). This EUA will remain in effect (meaning this test can be used) for the duration of the COVID-19 declaration under Section 564(b)(1) of the Act, 21 U.S.C. section 360bbb-3(b)(1), unless the authorization is terminated or revoked.  Performed at [facility] Lab, [address], [place], [place] [address]    Color, Urine 03/13/2024 YELLOW  YELLOW Final   APPearance 03/13/2024 CLEAR  CLEAR Final   Specific Gravity, Urine 03/13/2024 1.014  1.005 - 1.030 Final   pH 03/13/2024 5.0  5.0 - 8.0 Final   Glucose, UA 03/13/2024 NEGATIVE  NEGATIVE mg/dL Final   Hgb urine dipstick 03/13/2024 SMALL (A)  NEGATIVE Final   Bilirubin Urine 03/13/2024 NEGATIVE  NEGATIVE Final   Ketones, ur 03/13/2024 NEGATIVE  NEGATIVE mg/dL Final   Protein, ur 95/90/7974 NEGATIVE  NEGATIVE mg/dL Final   Nitrite 95/90/7974 NEGATIVE  NEGATIVE Final   Leukocytes,Ua 03/13/2024 SMALL (A)  NEGATIVE Final   RBC / HPF 03/13/2024 0-5  0 - 5 RBC/hpf Final   WBC, UA 03/13/2024 0-5  0 - 5 WBC/hpf Final   Bacteria, UA 03/13/2024 RARE (A)  NONE SEEN  Final   Squamous Epithelial / HPF 03/13/2024 0-5  0 - 5 /HPF Final   Performed at [facility] Lab, [address], [place], [place] [address]   Specimen Description 03/13/2024 URINE, CLEAN CATCH   Final   Special Requests 03/13/2024 NONE   Final   Culture 03/13/2024  (A)   Final  Value:20,000 COLONIES/mL GROUP B STREP(S.AGALACTIAE)ISOLATED TESTING AGAINST S. AGALACTIAE NOT ROUTINELY PERFORMED DUE TO PREDICTABILITY OF AMP/PEN/VAN SUSCEPTIBILITY. Performed at [facility], [address], [place], [place] [address]    Report Status 03/13/2024 03/14/2024 FINAL   Final   Specimen Description 03/13/2024 BLOOD SITE NOT SPECIFIED   Final   Special Requests 03/13/2024 BOTTLES DRAWN AEROBIC ONLY Blood Culture results may not be optimal due to an inadequate volume of blood received in culture bottles   Final   Culture 03/13/2024    Final                   Value:NO GROWTH 5 DAYS Performed at [facility], [address], [place], [place] [address]    Report Status 03/13/2024 03/18/2024 FINAL   Final   Specimen Description 03/13/2024 BLOOD SITE NOT SPECIFIED   Final   Special Requests 03/13/2024 BOTTLES DRAWN AEROBIC ONLY Blood Culture results may not be optimal due to an inadequate volume of blood received in culture bottles   Final   Culture 03/13/2024    Final                   Value:NO GROWTH 5 DAYS Performed at [facility], [address], [place], [place] [address]    Report Status 03/13/2024 03/18/2024 FINAL   Final   Weight 03/13/2024 1,600  oz Final   Height 03/13/2024 64  in Final   BP 03/13/2024 129/65  mmHg Final   Single Plane A2C EF 03/13/2024 46.8  % Final   Single Plane A4C EF 03/13/2024 49.7  % Final   Calc EF 03/13/2024 48.0  % Final   S' Lateral 03/13/2024 3.60  cm Final   Est EF 03/13/2024 45 - 50%   Final   Cholesterol 03/13/2024 133  0 - 200 mg/dL Final   Triglycerides 95/90/7974 68  <150 mg/dL Final   HDL 95/90/7974 46  >40 mg/dL  Final   Total CHOL/HDL Ratio 03/13/2024 2.9  RATIO Final   VLDL 03/13/2024 14  0 - 40 mg/dL Final   LDL Cholesterol 03/13/2024 73  0 - 99 mg/dL Final   Comment:        Total Cholesterol/HDL:CHD Risk Coronary Heart Disease Risk Table                     Men   Women  1/2 Average Risk   3.4   3.3  Average Risk       5.0   4.4  2 X Average Risk   9.6   7.1  3 X Average Risk  23.4   11.0        Use the calculated Patient Ratio above and the CHD Risk Table to determine the patient's CHD Risk.        ATP III CLASSIFICATION (LDL):  <100     mg/dL   Optimal  899-870  mg/dL   Near or Above                    Optimal  130-159  mg/dL   Borderline  839-810  mg/dL   High  >809     mg/dL   Very High Performed at [facility] Lab, [address] [place], [place] [address]    Sodium 03/14/2024 140  135 - 145 mmol/L Final   Potassium 03/14/2024 5.0  3.5 - 5.1 mmol/L Final   Chloride 03/14/2024 107  98 -  111 mmol/L Final   CO2 03/14/2024 22  22 - 32 mmol/L Final   Glucose, Bld 03/14/2024 84  70 - 99 mg/dL Final   Glucose reference range applies only to samples taken after fasting for at least 8 hours.   BUN 03/14/2024 7 (L)  8 - 23 mg/dL Final   Creatinine, Ser 03/14/2024 0.65  0.44 - 1.00 mg/dL Final   Calcium  03/14/2024 10.1  8.9 - 10.3 mg/dL Final   Total Protein 95/89/7974 6.3 (L)  6.5 - 8.1 g/dL Final   Albumin 95/89/7974 3.0 (L)  3.5 - 5.0 g/dL Final   AST 95/89/7974 16  15 - 41 U/L Final   ALT 03/14/2024 14  0 - 44 U/L Final   Alkaline Phosphatase 03/14/2024 115  38 - 126 U/L Final   Total Bilirubin 03/14/2024 0.6  0.0 - 1.2 mg/dL Final   GFR, Estimated 03/14/2024 >60  >60 mL/min Final   Comment: (NOTE) Calculated using the CKD-EPI Creatinine Equation (2021)    Anion gap 03/14/2024 11  5 - 15 Final   Performed at [facility] Lab, [address], [place], [place] [address]   WBC 03/14/2024 6.2  4.0 - 10.5 K/uL Final   RBC 03/14/2024 4.28  3.87 - 5.11 MIL/uL Final    Hemoglobin 03/14/2024 11.1 (L)  12.0 - 15.0 g/dL Final   HCT 95/89/7974 35.2 (L)  36.0 - 46.0 % Final   MCV 03/14/2024 82.2  80.0 - 100.0 fL Final   MCH 03/14/2024 25.9 (L)  26.0 - 34.0 pg Final   MCHC 03/14/2024 31.5  30.0 - 36.0 g/dL Final   RDW 95/89/7974 13.2  11.5 - 15.5 % Final   Platelets 03/14/2024 262  150 - 400 K/uL Final   nRBC 03/14/2024 0.0  0.0 - 0.2 % Final   Performed at [facility], [address], [place], [place] [address]   Magnesium  03/14/2024 1.6 (L)  1.7 - 2.4 mg/dL Final   Performed at [facility], [address], [place], [place] [address]   Magnesium  03/15/2024 1.8  1.7 - 2.4 mg/dL Final   Performed at [facility], [address] [place], [place] [address]   Sodium 03/15/2024 140  135 - 145 mmol/L Final   Potassium 03/15/2024 4.5  3.5 - 5.1 mmol/L Final   Chloride 03/15/2024 104  98 - 111 mmol/L Final   CO2 03/15/2024 26  22 - 32 mmol/L Final   Glucose, Bld 03/15/2024 98  70 - 99 mg/dL Final   Glucose reference range applies only to samples taken after fasting for at least 8 hours.   BUN 03/15/2024 11  8 - 23 mg/dL Final   Creatinine, Ser 03/15/2024 0.69  0.44 - 1.00 mg/dL Final   Calcium  03/15/2024 10.2  8.9 - 10.3 mg/dL Final   GFR, Estimated 03/15/2024 >60  >60 mL/min Final   Comment: (NOTE) Calculated using the CKD-EPI Creatinine Equation (2021)    Anion gap 03/15/2024 10  5 - 15 Final   Performed at [facility], [address], [place], [place] [address]   WBC 03/15/2024 4.8  4.0 - 10.5 K/uL Final   RBC 03/15/2024 4.79  3.87 - 5.11 MIL/uL Final   Hemoglobin 03/15/2024 12.5  12.0 - 15.0 g/dL Final   HCT 95/88/7974 39.0  36.0 - 46.0 % Final   MCV 03/15/2024 81.4  80.0 - 100.0 fL Final   MCH 03/15/2024 26.1  26.0 - 34.0 pg Final  MCHC 03/15/2024 32.1  30.0 - 36.0 g/dL Final   RDW 95/88/7974 13.0  11.5 - 15.5 % Final   Platelets 03/15/2024 283  150 - 400 K/uL Final   nRBC 03/15/2024 0.0  0.0 - 0.2 % Final   Performed at  [facility] Lab, [address], [place], [place] [address]    Allergies: Aspirin, Chocolate, Cocoa, Penicillins, and Sulfa antibiotics  Medications:  Facility Ordered Medications  Medication   acetaminophen  (TYLENOL ) tablet 650 mg   alum & mag hydroxide-simeth (MAALOX/MYLANTA) 200-200-20 MG/5ML suspension 30 mL   magnesium  hydroxide (MILK OF MAGNESIA) suspension 30 mL   OLANZapine  (ZYPREXA ) injection 5 mg   traZODone  (DESYREL ) tablet 50 mg   OLANZapine  zydis (ZYPREXA ) disintegrating tablet 5 mg   cloNIDine (CATAPRES) tablet 0.1 mg   PTA Medications  Medication Sig   losartan  (COZAAR ) 25 MG tablet Take 1 tablet (25 mg total) by mouth daily. (Patient not taking: Reported on 10/14/2024)   propranolol  (INDERAL ) 10 MG tablet Take 1 tablet (10 mg total) by mouth 2 (two) times daily. (Patient not taking: Reported on 10/14/2024)   OLANZapine  (ZYPREXA ) 10 MG tablet Take 1 tablet (10 mg total) by mouth at bedtime. (Patient not taking: Reported on 10/14/2024)   methimazole  (TAPAZOLE ) 10 MG tablet Take 1 tablet (10 mg total) by mouth daily. (Patient not taking: Reported on 10/14/2024)   pantoprazole  (PROTONIX ) 40 MG tablet Take 1 tablet (40 mg total) by mouth daily. (Patient not taking: Reported on 10/14/2024)   divalproex  (DEPAKOTE ) 500 MG DR tablet Take 1 tablet (500 mg total) by mouth at bedtime. (Patient not taking: Reported on 10/14/2024)   Medical Decision Making  -Patient meets criteria for continuous involuntary commitment, will uphold IVC, and recommend inpatient treatment for stabilization of mental status. -Start Zyprexa  5 mg twice daily for psychosis - Start 5 mg Zyprexa  p.o. or IM twice daily for agitation - Ordered baseline labs: TSH, lipid panel, hemoglobin A1c, CMP, CBC, UA, RPR, GC chlamydia, HIV, UDS, urinalysis to ascertain current psychosis.  Recommendations  Based on my evaluation the patient appears to have an emergency mental health condition for which I recommend the  patient be transferred to an inpatient behavioral health unit for treatment and stabilization.   Donia Snell, NP 10/14/24  2:58 PM

## 2024-10-14 NOTE — Progress Notes (Signed)
 LCSW Progress Note  [id number]   Shari [name]  10/14/2024  3:21 PM  Description:   Inpatient Psychiatric Referral  Patient was recommended inpatient per Donia Snell  (NP). There are no available beds at [facility], per [facility] Carlo RN). Patient was referred to the following out of network facilities:   Destination  Service Provider Address Phone Fax  [facility]  [address] [place], [place] [phone]  [facility]  [address] [place] [address] [phone]      Situation ongoing, CSW to continue following and update chart as more information becomes available.      Shari [name], MSW, LCSW  10/14/2024 3:21 PM
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
## 2024-10-14 NOTE — ED Notes (Signed)
 Patient cordial and nutrition/toileting offered. Patient alert/oriented x4 with no complaints of pain, SI, HI, or AVH. or No acute distress noted. Active listening, support and encouragement provided. Routine safety checks conducted according to unit policy.
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
## 2024-10-15 DIAGNOSIS — Z046 Encounter for general psychiatric examination, requested by authority: Secondary | ICD-10-CM | POA: Diagnosis not present

## 2024-10-15 DIAGNOSIS — F200 Paranoid schizophrenia: Secondary | ICD-10-CM | POA: Diagnosis not present

## 2024-10-15 LAB — SARS CORONAVIRUS 2 BY RT PCR: SARS Coronavirus 2 by RT PCR: NEGATIVE

## 2024-10-15 LAB — RPR: RPR Ser Ql: NONREACTIVE

## 2024-10-15 LAB — T4, FREE: Free T4: 3.18 ng/dL — ABNORMAL HIGH (ref 0.61–1.12)

## 2024-10-15 MED ORDER — OLANZAPINE 10 MG PO TABS
10.0000 mg | ORAL_TABLET | Freq: Every day | ORAL | Status: DC
  Administered 2024-10-15 – 2024-10-16 (×2): 10 mg via ORAL
  Filled 2024-10-15 (×2): qty 1

## 2024-10-15 MED ORDER — DIVALPROEX SODIUM 500 MG PO DR TAB
500.0000 mg | DELAYED_RELEASE_TABLET | Freq: Every day | ORAL | Status: DC
  Administered 2024-10-15 – 2024-10-16 (×2): 500 mg via ORAL
  Filled 2024-10-15 (×2): qty 1

## 2024-10-15 MED ORDER — LOSARTAN POTASSIUM 50 MG PO TABS
25.0000 mg | ORAL_TABLET | Freq: Every day | ORAL | Status: DC
  Administered 2024-10-15 – 2024-10-16 (×2): 25 mg via ORAL
  Filled 2024-10-15 (×2): qty 1

## 2024-10-15 MED ORDER — METHIMAZOLE 5 MG PO TABS
10.0000 mg | ORAL_TABLET | Freq: Every day | ORAL | Status: DC
  Administered 2024-10-15 – 2024-10-16 (×2): 10 mg via ORAL
  Filled 2024-10-15 (×2): qty 2

## 2024-10-15 MED ORDER — MAGNESIUM OXIDE -MG SUPPLEMENT 400 (240 MG) MG PO TABS
400.0000 mg | ORAL_TABLET | Freq: Every day | ORAL | Status: DC
  Administered 2024-10-15 – 2024-10-16 (×2): 400 mg via ORAL
  Filled 2024-10-15 (×2): qty 1

## 2024-10-15 MED ORDER — PANTOPRAZOLE SODIUM 40 MG PO TBEC
40.0000 mg | DELAYED_RELEASE_TABLET | Freq: Every day | ORAL | Status: DC
Start: 2024-10-15 — End: 2024-10-17
  Administered 2024-10-15 – 2024-10-16 (×2): 40 mg via ORAL
  Filled 2024-10-15 (×2): qty 1

## 2024-10-15 NOTE — ED Notes (Signed)
 Pt sleeping at this time. Rise and fall of chest noted. Pt in NAD at this time. Will continue to monitor.

## 2024-10-15 NOTE — ED Notes (Signed)
 Pt A&Ox3, calm & cooperative and in NAD at this time. Denies SI/HI/AVH. Contracts for safety. Encouragement and support given. Will continue to monitor.

## 2024-10-15 NOTE — Progress Notes (Signed)
 Inpatient Psychiatric Referral  Patient was recommended inpatient per Donia Snell (NP). There are no available beds at [facility], per [facility]. Patient was referred to the following out of network facilities:  Destination  Service Provider Address Phone Fax  [facility] EFAX  [address] [place], [place] [phone]  [facility]  [address] [place] [address] [phone]  [facility]  [address], [place] [address] [phone]  [facility]  [address] [place], [place] [address] [phone]  [facility]  [address] [place] [address] [phone]  [facility]  [address] [place] [address] [phone]  [facility]  [address], [place] [address] [phone]  [facility]  [address] [place] [address] [phone]  [facility] [address]  [address], [place] [address] [phone]  [facility]  [address] [place], [place] [address] [phone]  [facility]  [address] [place] [address] [phone]  [facility]  [address] [place] [address] [phone]  [facility]  [address] [place] [address] [phone]  [facility]  [address], [place] [address] [phone]    Situation ongoing, CSW to continue following and update chart as more information becomes available.   Harrie Sofia MSW, LCSWA 10/15/2024

## 2024-10-15 NOTE — Progress Notes (Incomplete)
 Inpatient Psychiatric Referral  Patient was recommended inpatient per (NP). There are no available beds at [facility], per [facility] (name). Patient was referred to the following out of network facilities:  Destination  Service Provider Request Status Services Address Phone Fax Patient Preferred  [facility]  Pending - Request Sent -- [address] [place] [phone] --  [facility]  Pending - Request Sent -- [address] [place] [address] [phone] --      Situation ongoing, CSW to continue following and update chart as more information becomes available.

## 2024-10-15 NOTE — ED Notes (Signed)
Patient sleeping with no s/s of distress.

## 2024-10-15 NOTE — ED Notes (Signed)
 Patient had oatmeal and orange juice

## 2024-10-15 NOTE — Progress Notes (Signed)
 LCSW Progress Note  [id number]   Shari [name]  10/15/2024  8:44 AM  Description:   Inpatient Psychiatric Referral  Patient was recommended inpatient per Donia Snell (NP). There are no available beds at [facility], per [facility] RN). Patient was referred to the following out of network facilities: Destination  Service Provider Address Phone Fax  [facility] EFAX  [address] [place], [place] [phone]  [facility]  [address], [place] [address] [phone]  [facility]  [address], [place] [address] [phone]  [facility]  [address] [place], [place] [address] [phone]  [facility]  [address] [place] [address] [phone]  [facility]  [address] [place] [address] [phone]  [facility]  [address] [place] [address] [phone]  [facility]  [address] [place] [address] [phone]  [facility] [address]  [address], [place] [address] [phone]  [facility]  [address] [place], [place] [address] [phone]  [facility]  [address] [place] [address] [phone]  [facility]  [address] [place] [address] [phone]  [facility]  [address] [place] [address] [phone]  [facility]  [address], [place] [address] [phone]      Situation ongoing, CSW to continue following and update chart as more information becomes available.      [place] Alexa Blish, MSW, LCSW   10/15/2024 8:44 AM

## 2024-10-15 NOTE — BH Assessment (Signed)
 Received a call from the intake coordinator at [facility], she said she will need to follow up with someone and she may give us  a call back to accept the pt.    Jackson JONETTA Broach, MS, [facility] Triage Specialist [phone]

## 2024-10-15 NOTE — Progress Notes (Signed)
 Inpatient Psychiatric Referral  Patient was recommended inpatient per Donia Snell  (NP). There are no available beds at [facility], per [facility]. Patient was referred to the following out of network facilities:  Destination  Service Provider Address Phone Fax  [facility] EFAX  [address] [place], [place] [phone]  [facility]  [address], [place] [address] [phone]    Destination  Service Provider Address Phone Fax  [facility] EFAX  [address] [place], [place] [phone]  [facility]  [address] [address] [phone]  [facility]  [address], [place] [address] [phone]  [facility]  [address] [place], [place] [address] [phone]  [facility]  [address] [place] [address] [phone]  [facility]  [address] [place] [address] [phone]  [facility]  [address], [place] [address] [phone]    Situation ongoing, CSW to continue following and update chart as more information becomes available.   Harrie Sofia MSW, LCSWA 10/15/2024

## 2024-10-15 NOTE — ED Notes (Signed)
Pt responding to internal stimuli

## 2024-10-15 NOTE — ED Notes (Signed)
 Called lab and they said they would add T4 onto previous sample.

## 2024-10-15 NOTE — ED Notes (Signed)
 Patient given sandwich,chips, fruit cup and juice
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
## 2024-10-16 DIAGNOSIS — Z046 Encounter for general psychiatric examination, requested by authority: Secondary | ICD-10-CM | POA: Diagnosis not present

## 2024-10-16 DIAGNOSIS — F200 Paranoid schizophrenia: Secondary | ICD-10-CM | POA: Diagnosis not present

## 2024-10-16 LAB — POCT URINE DRUG SCREEN - MANUAL ENTRY (I-SCREEN)
POC Amphetamine UR: NOT DETECTED
POC Buprenorphine (BUP): NOT DETECTED
POC Cocaine UR: NOT DETECTED
POC Marijuana UR: NOT DETECTED
POC Methadone UR: NOT DETECTED
POC Methamphetamine UR: NOT DETECTED
POC Morphine: NOT DETECTED
POC Oxazepam (BZO): NOT DETECTED
POC Oxycodone UR: NOT DETECTED
POC Secobarbital (BAR): NOT DETECTED

## 2024-10-16 LAB — URINALYSIS, ROUTINE W REFLEX MICROSCOPIC
Bilirubin Urine: NEGATIVE
Glucose, UA: NEGATIVE mg/dL
Ketones, ur: NEGATIVE mg/dL
Nitrite: NEGATIVE
Protein, ur: 30 mg/dL — AB
Specific Gravity, Urine: 1.025 (ref 1.005–1.030)
pH: 5 (ref 5.0–8.0)

## 2024-10-16 LAB — PROLACTIN: Prolactin: 5.8 ng/mL (ref 3.6–25.2)

## 2024-10-16 NOTE — Progress Notes (Signed)
 Pt was accepted to [facility] 10/16/2024 Bed Assignment L27   Address: [address] [place], [place], [place] [address]  [facility] Fax: [phone]  Pt meets inpatient criteria per: Donia Snell NP  Attending Physician will be Dr. Allyn Donnelly COME  Report can be called to: [phone]  Pt can arrive after [facility] WILL UPDATE   Care Team notified: Cherylynn Ernst RN, Alycia Lehmann RN    [place] Deyona Soza MSW, [facility] 10/16/2024 3:02 PM

## 2024-10-16 NOTE — ED Notes (Signed)
Patient provided snack.

## 2024-10-16 NOTE — Discharge Instructions (Signed)
 Please transfer to [facility] when bed becomes available

## 2024-10-16 NOTE — ED Notes (Signed)
 Pt sitting in dayroom eating meal . No acute distress noted. No concerns voiced. Informed pt to notify staff with any needs or assistance. Pt verbalized understanding and agreement. Will continue to monitor for safety.

## 2024-10-16 NOTE — ED Provider Notes (Signed)
 [facility] Continuous Assessment Progress note:  Date: 10/16/24 Patient Name: Shari [name] MRN: [id number] Chief Complaint: Delusional thinking  Diagnoses:  Final diagnoses:  Schizophrenia, paranoid [facility])  Involuntary commitment   HPI: Shari [name] is a 68 y.o. female with a prior psychiatry history of paranoid schizophrenia, as per documentation from [facility] on 05/25/2022, who presents to the [facility] accompanied by her case manager with complaints of delusional thinking.  Patient petitioned by case manager, and as per IVC petition:  [name] has been diagnosed with schizophrenia and is refusing to medication. [name] isn't sleeping.  Residents reported her screaming and yelling all night long. [name] responding to internal stimuli. [name] very aggressive verbally. The respondent has locked all of the residents inside of the transitional housing home.  They could not leave the home which prompted the IVC.  Residence did not have access to food, they restroom or anything as a result of being locked in. [name] is currently on the adult protective services care.  Assessment 10/16/2024: Patient is seen during encounter in the observation area, and is calm and cooperative, continues to present with delusions of persecution,states that she was removed from her home against her will, otherwise, no other concerns. She denies SI, denies HI, denies AVH. Paranoia is persistent, and she will benefit from a higher level of care in an inpatient behavioral health unit for continuous betterment of her mental health [facility] unit has accepted her and we will transfer her there when a bed becomes available and law enforcement is able to provide her with the transportation there.  Recommendations: We are continuing to recommend inpatient hospitalization for treatment and stabilization of mental status.  And patient's current mental state, she  presents as a risk of danger to herself and others.  See complete orders below.  Total Time spent with patient: 25 minutes  Musculoskeletal  Strength & Muscle Tone: within normal limits Gait & Station: normal Patient leans: N/A  Psychiatric Specialty Exam  Presentation General Appearance: Fairly Groomed  Eye Contact:Fair  Speech:Clear and Coherent; Normal Rate  Speech Volume:Normal  Handedness:Right   Mood and Affect  Mood:Anxious  Affect:Labile   Thought Process  Thought Processes:Disorganized  Descriptions of Associations:Tangential  Orientation:Full (Time, Place and Person)  Thought Content:Paranoid Ideation; Tangential; Delusions  Diagnosis of Schizophrenia or Schizoaffective disorder in past: Yes  Duration of Psychotic Symptoms: Greater than six months  Hallucinations:Hallucinations: None  Ideas of Reference:Paranoia; Delusions  Suicidal Thoughts:Suicidal Thoughts: No  Homicidal Thoughts:Homicidal Thoughts: No   Sensorium  Memory:Immediate Fair; Recent Poor  Judgment:Impaired  Insight:Lacking   Executive Functions  Concentration:Fair  Attention Span:Poor  Recall:Fair  Fund of Knowledge:Fair  Language:Fair   Psychomotor Activity  Psychomotor Activity:Psychomotor Activity: Normal   Assets  Assets:Communication Skills; Financial Resources/Insurance; Housing; Physical Health; Resilience  Sleep  Sleep:Sleep: Fair  No data recorded  Physical Exam Review of Systems  Psychiatric/Behavioral:  Positive for hallucinations. Negative for depression, memory loss, substance abuse and suicidal ideas. The patient is nervous/anxious and has insomnia.    Blood pressure 120/81, pulse 92, temperature 98.2 F (36.8 C), temperature source Oral, resp. rate 16, SpO2 100%. There is no height or weight on file to calculate BMI.  Past Psychiatric History: Schizophrenia  Is the patient at risk to self? Yes  Has the patient been a risk to self in the  past 6 months? Yes .    Has the patient been a risk to self within the  distant past? Yes   Is the patient a risk to others? Yes   Has the patient been a risk to others in the past 6 months? Yes   Has the patient been a risk to others within the distant past? unknown  Mental health, medical and social history  Homeless, but residing in transitional housing.  Hyperlipemia   Hypertension   Paranoid schizophrenia (HCC)   Pre-diabetes   Schizoaffective disorder, bipolar type (HCC)  Family History: Reports that she does not have any family  Last Labs:  Admission on 10/14/2024  Component Date Value Ref Range Status   SARS Coronavirus 2 by RT PCR 10/15/2024 NEGATIVE  NEGATIVE Final   Performed at [facility], [address] [place], [place] [address]   WBC 10/14/2024 5.4  4.0 - 10.5 K/uL Final   RBC 10/14/2024 4.48  3.87 - 5.11 MIL/uL Final   Hemoglobin 10/14/2024 11.6 (L)  12.0 - 15.0 g/dL Final   HCT 88/89/7974 36.9  36.0 - 46.0 % Final   MCV 10/14/2024 82.4  80.0 - 100.0 fL Final   MCH 10/14/2024 25.9 (L)  26.0 - 34.0 pg Final   MCHC 10/14/2024 31.4  30.0 - 36.0 g/dL Final   RDW 88/89/7974 14.2  11.5 - 15.5 % Final   Platelets 10/14/2024 311  150 - 400 K/uL Final   nRBC 10/14/2024 0.0  0.0 - 0.2 % Final   Neutrophils Relative % 10/14/2024 51  % Final   Neutro Abs 10/14/2024 2.7  1.7 - 7.7 K/uL Final   Lymphocytes Relative 10/14/2024 42  % Final   Lymphs Abs 10/14/2024 2.3  0.7 - 4.0 K/uL Final   Monocytes Relative 10/14/2024 7  % Final   Monocytes Absolute 10/14/2024 0.4  0.1 - 1.0 K/uL Final   Eosinophils Relative 10/14/2024 0  % Final   Eosinophils Absolute 10/14/2024 0.0  0.0 - 0.5 K/uL Final   Basophils Relative 10/14/2024 0  % Final   Basophils Absolute 10/14/2024 0.0  0.0 - 0.1 K/uL Final   Immature Granulocytes 10/14/2024 0  % Final   Abs Immature Granulocytes 10/14/2024 0.01  0.00 - 0.07 K/uL Final   Performed at [facility], [address],  [place], [place] [address]   Sodium 10/14/2024 144  135 - 145 mmol/L Final   Potassium 10/14/2024 3.8  3.5 - 5.1 mmol/L Final   Chloride 10/14/2024 105  98 - 111 mmol/L Final   CO2 10/14/2024 25  22 - 32 mmol/L Final   Glucose, Bld 10/14/2024 83  70 - 99 mg/dL Final   Glucose reference range applies only to samples taken after fasting for at least 8 hours.   BUN 10/14/2024 <5 (L)  8 - 23 mg/dL Final   Creatinine, Ser 10/14/2024 0.51  0.44 - 1.00 mg/dL Final   Calcium  10/14/2024 10.0  8.9 - 10.3 mg/dL Final   Total Protein 88/89/7974 7.4  6.5 - 8.1 g/dL Final   Albumin 88/89/7974 3.4 (L)  3.5 - 5.0 g/dL Final   AST 88/89/7974 18  15 - 41 U/L Final   ALT 10/14/2024 15  0 - 44 U/L Final   Alkaline Phosphatase 10/14/2024 209 (H)  38 - 126 U/L Final   Total Bilirubin 10/14/2024 0.6  0.0 - 1.2 mg/dL Final   GFR, Estimated 10/14/2024 >60  >60 mL/min Final   Comment: (NOTE) Calculated using the CKD-EPI Creatinine Equation (2021)    Anion gap 10/14/2024 14  5 - 15 Final  Performed at [facility], [address], [place], [place] [address]   Hgb A1c MFr Bld 10/14/2024 5.5  4.8 - 5.6 % Final   Comment: (NOTE) Diagnosis of Diabetes The following HbA1c ranges recommended by the American Diabetes Association (ADA) may be used as an aid in the diagnosis of diabetes mellitus.  Hemoglobin             Suggested A1C NGSP%              Diagnosis  <5.7                   Non Diabetic  5.7-6.4                Pre-Diabetic  >6.4                   Diabetic  <7.0                   Glycemic control for                       adults with diabetes.     Mean Plasma Glucose 10/14/2024 111.15  mg/dL Final   Performed at [facility], [address], [place], [place] [address]   Magnesium  10/14/2024 1.6 (L)  1.7 - 2.4 mg/dL Final   Performed at [facility], [address] [place], [place] [address]   Alcohol, Ethyl (B) 10/14/2024 <15  <15 mg/dL Final   Comment: (NOTE) For medical  purposes only. Performed at [facility], [address], [place], [place] [address]    Cholesterol 10/14/2024 167  0 - 200 mg/dL Final   Triglycerides 88/89/7974 68  <150 mg/dL Final   HDL 88/89/7974 60  >40 mg/dL Final   Total CHOL/HDL Ratio 10/14/2024 2.8  RATIO Final   VLDL 10/14/2024 14  0 - 40 mg/dL Final   LDL Cholesterol 10/14/2024 93  0 - 99 mg/dL Final   Comment:        Total Cholesterol/HDL:CHD Risk Coronary Heart Disease Risk Table                     Men   Women  1/2 Average Risk   3.4   3.3  Average Risk       5.0   4.4  2 X Average Risk   9.6   7.1  3 X Average Risk  23.4   11.0        Use the calculated Patient Ratio above and the CHD Risk Table to determine the patient's CHD Risk.        ATP III CLASSIFICATION (LDL):  <100     mg/dL   Optimal  899-870  mg/dL   Near or Above                    Optimal  130-159  mg/dL   Borderline  839-810  mg/dL   High  >809     mg/dL   Very High Performed at [facility], [address], [place], [place] [address]    TSH 10/14/2024 <0.100 (L)  0.350 - 4.500 uIU/mL Final   Comment: Performed by a 3rd Generation assay with a functional sensitivity of <=0.01 uIU/mL. Performed at [facility], [address] [place], [place] [address]    Prolactin 10/14/2024 5.8  3.6 - 25.2 ng/mL Final  Comment: (NOTE) Performed At: [facility] [address] [place], [place] [id number] Jennette Shorter MD Ey:[id number]    Color, Urine 10/16/2024 AMBER (A)  YELLOW Final   BIOCHEMICALS MAY BE AFFECTED BY COLOR   APPearance 10/16/2024 HAZY (A)  CLEAR Final   Specific Gravity, Urine 10/16/2024 1.025  1.005 - 1.030 Final   pH 10/16/2024 5.0  5.0 - 8.0 Final   Glucose, UA 10/16/2024 NEGATIVE  NEGATIVE mg/dL Final   Hgb urine dipstick 10/16/2024 SMALL (A)  NEGATIVE Final   Bilirubin Urine 10/16/2024 NEGATIVE  NEGATIVE Final   Ketones, ur 10/16/2024 NEGATIVE  NEGATIVE mg/dL Final   Protein, ur 88/87/7974 30 (A)   NEGATIVE mg/dL Final   Nitrite 88/87/7974 NEGATIVE  NEGATIVE Final   Leukocytes,Ua 10/16/2024 LARGE (A)  NEGATIVE Final   RBC / HPF 10/16/2024 11-20  0 - 5 RBC/hpf Final   WBC, UA 10/16/2024 21-50  0 - 5 WBC/hpf Final   Bacteria, UA 10/16/2024 RARE (A)  NONE SEEN Final   Squamous Epithelial / HPF 10/16/2024 6-10  0 - 5 /HPF Final   Mucus 10/16/2024 PRESENT   Final   Hyaline Casts, UA 10/16/2024 PRESENT   Final   Non Squamous Epithelial 10/16/2024 0-5 (A)  NONE SEEN Final   Performed at [facility], [address] [place], [place] [address]   RPR Ser Ql 10/14/2024 NON REACTIVE  NON REACTIVE Final   Performed at [facility], [address] [place], [place] [address]   POC Amphetamine UR 10/16/2024 None Detected  NONE DETECTED (Cut Off Level 1000 ng/mL) Final   POC Secobarbital (BAR) 10/16/2024 None Detected  NONE DETECTED (Cut Off Level 300 ng/mL) Final   POC Buprenorphine (BUP) 10/16/2024 None Detected  NONE DETECTED (Cut Off Level 10 ng/mL) Final   POC Oxazepam (BZO) 10/16/2024 None Detected  NONE DETECTED (Cut Off Level 300 ng/mL) Final   POC Cocaine UR 10/16/2024 None Detected  NONE DETECTED (Cut Off Level 300 ng/mL) Final   POC Methamphetamine UR 10/16/2024 None Detected  NONE DETECTED (Cut Off Level 1000 ng/mL) Final   POC Morphine  10/16/2024 None Detected  NONE DETECTED (Cut Off Level 300 ng/mL) Final   POC Methadone UR 10/16/2024 None Detected  NONE DETECTED (Cut Off Level 300 ng/mL) Final   POC Oxycodone  UR 10/16/2024 None Detected  NONE DETECTED (Cut Off Level 100 ng/mL) Final   POC Marijuana UR 10/16/2024 None Detected  NONE DETECTED (Cut Off Level 50 ng/mL) Final   HIV Screen 4th Generation wRfx 10/14/2024 Non Reactive  Non Reactive Final   Performed at [facility], [address], [place], [place] [address]   Free T4 10/14/2024 3.18 (H)  0.61 - 1.12 ng/dL Final   Comment: (NOTE) Biotin ingestion may interfere with free T4 tests. If the results  are inconsistent with the TSH level, previous test results, or the clinical presentation, then consider biotin interference. If needed, order repeat testing after stopping biotin. Performed at [facility], [address] [place], [place] [address]     Allergies: Aspirin, Chocolate, Cocoa, Penicillins, and Sulfa antibiotics  Medications:  Facility Ordered Medications  Medication   acetaminophen  (TYLENOL ) tablet 650 mg   alum & mag hydroxide-simeth (MAALOX/MYLANTA) 200-200-20 MG/5ML suspension 30 mL   magnesium  hydroxide (MILK OF MAGNESIA) suspension 30 mL   OLANZapine  (ZYPREXA ) injection 5 mg   traZODone  (DESYREL ) tablet 50 mg   OLANZapine  zydis (ZYPREXA ) disintegrating tablet 5 mg   cloNIDine (CATAPRES) tablet  0.1 mg   losartan  (COZAAR ) tablet 25 mg   methimazole  (TAPAZOLE ) tablet 10 mg   OLANZapine  (ZYPREXA ) tablet 10 mg   pantoprazole  (PROTONIX ) EC tablet 40 mg   magnesium  oxide (MAG-OX) tablet 400 mg   divalproex  (DEPAKOTE ) DR tablet 500 mg   PTA Medications  Medication Sig   losartan  (COZAAR ) 25 MG tablet Take 1 tablet (25 mg total) by mouth daily. (Patient not taking: Reported on 10/14/2024)   propranolol  (INDERAL ) 10 MG tablet Take 1 tablet (10 mg total) by mouth 2 (two) times daily. (Patient not taking: Reported on 10/14/2024)   OLANZapine  (ZYPREXA ) 10 MG tablet Take 1 tablet (10 mg total) by mouth at bedtime. (Patient not taking: Reported on 10/14/2024)   methimazole  (TAPAZOLE ) 10 MG tablet Take 1 tablet (10 mg total) by mouth daily. (Patient not taking: Reported on 10/14/2024)   pantoprazole  (PROTONIX ) 40 MG tablet Take 1 tablet (40 mg total) by mouth daily. (Patient not taking: Reported on 10/14/2024)   divalproex  (DEPAKOTE ) 500 MG DR tablet Take 1 tablet (500 mg total) by mouth at bedtime. (Patient not taking: Reported on 10/14/2024)   Medical Decision Making  -Patient meets criteria for continuous involuntary commitment, will uphold IVC, and recommend  inpatient treatment for stabilization of mental status. -Continue Zyprexa  10 mg nightly for psychosis -Continue 5 mg Zyprexa  p.o. or IM twice daily for agitation Labs Reviewed: recommending unit at [facility] recheck urine for possible UTI, or other disease process since patient has large amounts of blood in her urine which seem to be chronic.   Based on my evaluation the patient appears to have an emergency mental health condition for which I recommend the patient be transferred to an inpatient behavioral health unit for treatment and stabilization.   Donia Snell, NP 10/16/24  7:25 PM

## 2024-10-16 NOTE — ED Notes (Signed)
 Patient resting quietly in bed with eyes closed, Respirations equal and unlabored, skin warm and dry, NAD. Routine safety checks conducted according to facility protocol. Will continue to monitor for safety.

## 2024-10-16 NOTE — ED Notes (Signed)
Patient sleeping with no s/s of distress.

## 2024-10-17 ENCOUNTER — Encounter: Payer: Self-pay | Admitting: Psychiatry

## 2024-10-17 ENCOUNTER — Inpatient Hospital Stay: Admission: AD | Admit: 2024-10-17 | Source: Intra-hospital | Admitting: Psychiatry

## 2024-10-17 ENCOUNTER — Other Ambulatory Visit: Payer: Self-pay

## 2024-10-17 DIAGNOSIS — Z046 Encounter for general psychiatric examination, requested by authority: Secondary | ICD-10-CM

## 2024-10-17 DIAGNOSIS — F200 Paranoid schizophrenia: Principal | ICD-10-CM

## 2024-10-17 LAB — VALPROIC ACID LEVEL: Valproic Acid Lvl: 28 ug/mL — ABNORMAL LOW (ref 50–100)

## 2024-10-17 MED ORDER — DIVALPROEX SODIUM 250 MG PO DR TAB
500.0000 mg | DELAYED_RELEASE_TABLET | Freq: Every day | ORAL | Status: DC
  Filled 2024-10-17: qty 2

## 2024-10-17 MED ORDER — TRAZODONE HCL 50 MG PO TABS
50.0000 mg | ORAL_TABLET | Freq: Every evening | ORAL | Status: AC | PRN
  Administered 2024-10-27 – 2025-01-10 (×62): 50 mg via ORAL
  Filled 2024-10-17 (×58): qty 1

## 2024-10-17 MED ORDER — OLANZAPINE 10 MG PO TABS
10.0000 mg | ORAL_TABLET | Freq: Every day | ORAL | Status: DC
Start: 2024-10-17 — End: 2024-10-23
  Administered 2024-10-17 – 2024-10-22 (×6): 10 mg via ORAL
  Filled 2024-10-17 (×2): qty 1
  Filled 2024-10-17: qty 2
  Filled 2024-10-17 (×2): qty 1
  Filled 2024-10-17 (×5): qty 2

## 2024-10-17 MED ORDER — ALUM & MAG HYDROXIDE-SIMETH 200-200-20 MG/5ML PO SUSP: 30.0000 mL | ORAL | Status: AC | PRN

## 2024-10-17 MED ORDER — DIVALPROEX SODIUM 250 MG PO DR TAB
500.0000 mg | DELAYED_RELEASE_TABLET | Freq: Two times a day (BID) | ORAL | Status: DC
  Administered 2024-10-17 – 2024-10-25 (×17): 500 mg via ORAL
  Filled 2024-10-17 (×16): qty 2

## 2024-10-17 MED ORDER — OLANZAPINE 10 MG IM SOLR: 5.0000 mg | Freq: Three times a day (TID) | INTRAMUSCULAR | Status: AC | PRN

## 2024-10-17 MED ORDER — MAGNESIUM HYDROXIDE 400 MG/5ML PO SUSP
30.0000 mL | Freq: Every day | ORAL | Status: AC | PRN
  Filled 2024-10-17: qty 30

## 2024-10-17 MED ORDER — CLONIDINE HCL 0.1 MG PO TABS: 0.1000 mg | ORAL_TABLET | Freq: Two times a day (BID) | ORAL | Status: AC | PRN

## 2024-10-17 MED ORDER — MAGNESIUM OXIDE -MG SUPPLEMENT 400 (240 MG) MG PO TABS
400.0000 mg | ORAL_TABLET | Freq: Every day | ORAL | Status: AC
  Administered 2024-10-17 – 2024-10-19 (×3): 400 mg via ORAL
  Filled 2024-10-17 (×3): qty 1

## 2024-10-17 MED ORDER — METHIMAZOLE 10 MG PO TABS
10.0000 mg | ORAL_TABLET | Freq: Every day | ORAL | Status: AC
  Administered 2024-10-17 – 2025-01-10 (×86): 10 mg via ORAL
  Filled 2024-10-17 (×81): qty 1

## 2024-10-17 MED ORDER — PANTOPRAZOLE SODIUM 40 MG PO TBEC
40.0000 mg | DELAYED_RELEASE_TABLET | Freq: Every day | ORAL | Status: DC
  Administered 2024-10-17 – 2024-12-12 (×57): 40 mg via ORAL
  Filled 2024-10-17 (×50): qty 1

## 2024-10-17 MED ORDER — LOSARTAN POTASSIUM 25 MG PO TABS
25.0000 mg | ORAL_TABLET | Freq: Every day | ORAL | Status: AC
  Administered 2024-10-19 – 2025-01-10 (×83): 25 mg via ORAL
  Filled 2024-10-17 (×80): qty 1

## 2024-10-17 MED ORDER — OLANZAPINE 5 MG PO TBDP: 5.0000 mg | ORAL_TABLET | Freq: Three times a day (TID) | ORAL | Status: AC | PRN

## 2024-10-17 MED ORDER — ACETAMINOPHEN 325 MG PO TABS
650.0000 mg | ORAL_TABLET | Freq: Four times a day (QID) | ORAL | Status: AC | PRN
  Administered 2024-10-17 – 2025-01-10 (×65): 650 mg via ORAL
  Filled 2024-10-17 (×61): qty 2

## 2024-10-17 NOTE — Progress Notes (Signed)
 Patient is a 68 year old female admitted involuntarily to the [facility] floor from [facility] for reports of medication noncompliance, poor sleep, hallucinations, and essentially holding residents captive at her transitional group home. Patient presents to assessment via wheelchair but is ambulatory. [name] A+O x 3. She currently denies SI/HI/AVH. Patient's affect is flat and speech is soft. Patient denies depression and anxiety. She currently denies pain. Patient denies the use of a mobility aid at home but walks with care and declines one for use on the unit. Reports the use of reading glasses and has them on her person. Reports last [facility] November 12th. Patient reports smoking  pack cigs but declines any nicotine  replacement. States that she does not drink alcohol or use substances. Patient says that she does not have a support system but is in the care of APS. Her goal while she is here is "to get better."   Skin assessment and body search completed with Nicanor, RN Skin: warm/dry. Mid abdominal scar. No contrabands found.   Emotional support and reassurance provided throughout admission intake. Consents signed. Afterwards, oriented patient to unit, room and call light, reviewed POC with all questions answered and concerns voiced. Patient verbalized understanding. Denies any needs at this time.  Will continue to monitor with ongoing Q 15 minute safety checks.

## 2024-10-17 NOTE — ED Notes (Signed)
 Patient is transferring to [facility] psych unit at this time via sheriff. IVC, EMTALA, and other transfer paperwork sent with patient and provided to transport. Patient alert and cooperative at time of transport. Denies SI,HI, and A/V/H.  All valuables/belongings sent with patient. Patient in no current distress.

## 2024-10-17 NOTE — Plan of Care (Signed)
  Problem: Activity: Goal: Interest or engagement in activities will improve Outcome: Progressing   Problem: Physical Regulation: Goal: Ability to maintain clinical measurements within normal limits will improve Outcome: Progressing   Problem: Safety: Goal: Periods of time without injury will increase Outcome: Progressing

## 2024-10-17 NOTE — ED Notes (Signed)
Pt rates depression 5/10 and anxiety 5/10. Pt reports a good appetite, and no physical problems. Pt denies SI/HI/AVH and verbally contracts for safety. Provided support and encouragement. Pt safe on the unit. Q 15 minute safety checks continued.

## 2024-10-17 NOTE — ED Notes (Signed)
 Patient currently resting in recliner. Patient denies SI,HI, and A/V/H with no plan or intent. Patient is cooperative at this time.

## 2024-10-17 NOTE — ED Notes (Signed)
 Sheriff contacted for transport. Will receive a call back when transportation is on the way.

## 2024-10-17 NOTE — Group Note (Signed)
 LCSW Group Therapy Note  Group Date: 10/17/2024 Start Time: 1330 End Time: 1400   Type of Therapy and Topic:  Group Therapy - Healthy vs Unhealthy Coping Skills  Participation Level:  Did Not Attend   Description of Group The focus of this group was to determine what unhealthy coping techniques typically are used by group members and what healthy coping techniques would be helpful in coping with various problems. Patients were guided in becoming aware of the differences between healthy and unhealthy coping techniques. Patients were asked to identify 2-3 healthy coping skills they would like to learn to use more effectively.  Therapeutic Goals Patients learned that coping is what human beings do all day long to deal with various situations in their lives Patients defined and discussed healthy vs unhealthy coping techniques Patients identified their preferred coping techniques and identified whether these were healthy or unhealthy Patients determined 2-3 healthy coping skills they would like to become more familiar with and use more often. Patients provided support and ideas to each other   Summary of Patient Progress:  X   Therapeutic Modalities Cognitive Behavioral Therapy Motivational Interviewing  [name], [place] 10/17/2024  2:23 PM

## 2024-10-17 NOTE — Plan of Care (Signed)
  Problem: Education: Goal: Knowledge of [facility] Education information/materials will improve Outcome: Progressing Goal: Emotional status will improve Outcome: Progressing Goal: Mental status will improve Outcome: Progressing   Problem: Activity: Goal: Sleeping patterns will improve Outcome: Progressing   Problem: Activity: Goal: Interest or engagement in activities will improve Outcome: Not Progressing

## 2024-10-17 NOTE — ED Provider Notes (Signed)
 FBC/OBS ASAP Discharge Summary  Date and Time: 10/17/2024 9:33 AM  Name: Shari [name]  MRN:  [id number]   Discharge Diagnoses:  Final diagnoses:  Schizophrenia, paranoid (HCC)  Involuntary commitment    Subjective: Shari [name] is a 68 y.o. female with a prior psychiatry history of paranoid schizophrenia, as per documentation from [facility] on 05/25/2022, who presents to the [facility] accompanied by her case manager with complaints of delusional thinking.  Patient petitioned by case manager, and as per IVC petition:   [name] has been diagnosed with schizophrenia and is refusing to medication. [name] isn't sleeping.  Residents reported her screaming and yelling all night long. [name] responding to internal stimuli. [name] very aggressive verbally. The respondent has locked all of the residents inside of the transitional housing home.  They could not leave the home which prompted the IVC.  Residence did not have access to food, they restroom or anything as a result of being locked in. [name] is currently on the adult protective services care.   Assessment: Patient was evaluated by a provider and it is reported that patient  presented  irritable,  with delusions of persecution,  with paranoid ideation: Was persistently stating that Rutha Jama Molt resides at her home, stated that she owns the home, whereas she resides at a transitional housing home. Patient  reported  that she has resided at the home since 68 years old, denied having any mental health problems, but has been diagnosed with schizophrenia in the past. Patient admitted  to drinking 3 beers the day before admission , but in her presenting  mental state, might be drinking more. Patient presented with no  SI, and denied HI.  She was experiencing  escalating irritability, refusing to voluntarily comply with getting treatment for current mental status. Stated  that she will not take any  medications for her mental health.  Denied  symptoms consistent with other mental health conditions, but as per her case manager, patient had  not been sleeping, had been exhibiting aggressive behaviors towards her peers at the transitional housing.  Patient was not cooperative with the rest of the assessment, and was asking to be discharged. Patient presented as  a poor historian  and could not provide accurate information.    Due to the imminent risks of danger to her peers at the transitional housing,  involuntary commitment was initiated to enhance treatment and stabilization.      Stay Summary:  Patient was admitted to the Observation unit, pending admission to inpatient.  Lab work was completed per facility protocol. Home medications were reviewed.   Agitation protocol was ordered Home medications were continued Per nursing, patient remained cooperative on the unit and rested  well.   Face-to-face evaluation this morning 10/17/2024:   Patient seen on the unit, sitting in the bedside chair. [name] alert and oriented, calm and cooperative, but somewhat guarded. Continues to say that she was removed from her home against her will, that she did not have to go to the hospital, that there is nothing wrong with her. Appears somewhat anxious. [name] educated about the benefits of receiving inpatient treatment and becomes more and more understanding. Her speech is clear and eye contact is improved. Patient denies SI/HI. Denies hallucinations but somehow guarded or reserved. Reports that sleep and appetite are improved.   Patient continues to meet criteria for inpatient and she is being transferred to [facility] unit. Accepted by Dr Rogelia,  MD.    Total Time spent with patient: 45 minutes  Past Psychiatric History: Schizophrenia Past Medical History:  See chart Family History: NA Family Psychiatric History: NA Social History: Currently resides at a transitional home Tobacco Cessation:   N/A, patient does not currently use tobacco products  Current Medications:  Current Facility-Administered Medications  Medication Dose Route Frequency Provider Last Rate Last Admin   acetaminophen  (TYLENOL ) tablet 650 mg  650 mg Oral Q6H PRN Tex Drilling, NP   650 mg at 10/15/24 1319   alum & mag hydroxide-simeth (MAALOX/MYLANTA) 200-200-20 MG/5ML suspension 30 mL  30 mL Oral Q4H PRN Nkwenti, Doris, NP       cloNIDine (CATAPRES) tablet 0.1 mg  0.1 mg Oral BID PRN Tex Drilling, NP       divalproex  (DEPAKOTE ) DR tablet 500 mg  500 mg Oral QHS Dasie Ellouise CROME, FNP   500 mg at 10/16/24 2151   losartan  (COZAAR ) tablet 25 mg  25 mg Oral Daily Dasie Ellouise CROME, FNP   25 mg at 10/16/24 9073   magnesium  hydroxide (MILK OF MAGNESIA) suspension 30 mL  30 mL Oral Daily PRN Tex Drilling, NP       magnesium  oxide (MAG-OX) tablet 400 mg  400 mg Oral Daily Dasie Ellouise CROME, FNP   400 mg at 10/16/24 9073   methimazole  (TAPAZOLE ) tablet 10 mg  10 mg Oral Daily Dasie Ellouise CROME, FNP   10 mg at 10/16/24 9073   OLANZapine  (ZYPREXA ) injection 5 mg  5 mg Intramuscular TID PRN Tex Drilling, NP       OLANZapine  (ZYPREXA ) tablet 10 mg  10 mg Oral QHS Dasie Ellouise CROME, FNP   10 mg at 10/16/24 2151   OLANZapine  zydis (ZYPREXA ) disintegrating tablet 5 mg  5 mg Oral TID PRN Tex Drilling, NP       pantoprazole  (PROTONIX ) EC tablet 40 mg  40 mg Oral Daily Dasie Ellouise CROME, FNP   40 mg at 10/16/24 9073   traZODone  (DESYREL ) tablet 50 mg  50 mg Oral QHS PRN Tex Drilling, NP   50 mg at 10/16/24 2150   Current Outpatient Medications  Medication Sig Dispense Refill   Ascorbic Acid (VITAMIN C PO) Take 1 tablet by mouth daily. (Patient not taking: Reported on 10/14/2024)     divalproex  (DEPAKOTE ) 500 MG DR tablet Take 1 tablet (500 mg total) by mouth at bedtime. (Patient not taking: Reported on 10/14/2024) 30 tablet 1   losartan  (COZAAR ) 25 MG tablet Take 1 tablet (25 mg total) by mouth daily. (Patient not taking: Reported on  10/14/2024) 90 tablet 1   methimazole  (TAPAZOLE ) 10 MG tablet Take 1 tablet (10 mg total) by mouth daily. (Patient not taking: Reported on 10/14/2024) 90 tablet 1   OLANZapine  (ZYPREXA ) 10 MG tablet Take 1 tablet (10 mg total) by mouth at bedtime. (Patient not taking: Reported on 10/14/2024) 30 tablet 1   pantoprazole  (PROTONIX ) 40 MG tablet Take 1 tablet (40 mg total) by mouth daily. (Patient not taking: Reported on 10/14/2024) 90 tablet 0   propranolol  (INDERAL ) 10 MG tablet Take 1 tablet (10 mg total) by mouth 2 (two) times daily. (Patient not taking: Reported on 10/14/2024) 180 tablet 1   VITAMIN D PO Take 1 tablet by mouth daily. (Patient not taking: Reported on 10/14/2024)     VITAMIN E PO Take 1 tablet by mouth daily. (Patient not taking: Reported on 10/14/2024)      PTA Medications:  Facility Ordered Medications  Medication  acetaminophen  (TYLENOL ) tablet 650 mg   alum & mag hydroxide-simeth (MAALOX/MYLANTA) 200-200-20 MG/5ML suspension 30 mL   magnesium  hydroxide (MILK OF MAGNESIA) suspension 30 mL   OLANZapine  (ZYPREXA ) injection 5 mg   traZODone  (DESYREL ) tablet 50 mg   OLANZapine  zydis (ZYPREXA ) disintegrating tablet 5 mg   cloNIDine (CATAPRES) tablet 0.1 mg   losartan  (COZAAR ) tablet 25 mg   methimazole  (TAPAZOLE ) tablet 10 mg   OLANZapine  (ZYPREXA ) tablet 10 mg   pantoprazole  (PROTONIX ) EC tablet 40 mg   magnesium  oxide (MAG-OX) tablet 400 mg   divalproex  (DEPAKOTE ) DR tablet 500 mg   PTA Medications  Medication Sig   losartan  (COZAAR ) 25 MG tablet Take 1 tablet (25 mg total) by mouth daily. (Patient not taking: Reported on 10/14/2024)   propranolol  (INDERAL ) 10 MG tablet Take 1 tablet (10 mg total) by mouth 2 (two) times daily. (Patient not taking: Reported on 10/14/2024)   OLANZapine  (ZYPREXA ) 10 MG tablet Take 1 tablet (10 mg total) by mouth at bedtime. (Patient not taking: Reported on 10/14/2024)   methimazole  (TAPAZOLE ) 10 MG tablet Take 1 tablet (10 mg total) by  mouth daily. (Patient not taking: Reported on 10/14/2024)   pantoprazole  (PROTONIX ) 40 MG tablet Take 1 tablet (40 mg total) by mouth daily. (Patient not taking: Reported on 10/14/2024)   divalproex  (DEPAKOTE ) 500 MG DR tablet Take 1 tablet (500 mg total) by mouth at bedtime. (Patient not taking: Reported on 10/14/2024)        No data to display          Flowsheet Row ED from 10/14/2024 in [facility] ED to Hosp-Admission (Discharged) from 03/13/2024 in [facility] ED from 03/06/2023 in [facility] Emergency Department at [facility]  C-SSRS RISK CATEGORY No Risk No Risk No Risk    Musculoskeletal  Strength & Muscle Tone: within normal limits Gait & Station: normal Patient leans: N/A  Psychiatric Specialty Exam  Presentation  General Appearance:  Fairly Groomed  Eye Contact: Fair  Speech: Clear and Coherent  Speech Volume: Normal  Handedness: Right   Mood and Affect  Mood: Anxious  Affect: Labile   Thought Process  Thought Processes: Disorganized  Descriptions of Associations:Tangential  Orientation:Full (Time, Place and Person)  Thought Content:Paranoid Ideation; Tangential  Diagnosis of Schizophrenia or Schizoaffective disorder in past: Yes  Duration of Psychotic Symptoms: Greater than six months   Hallucinations:Hallucinations: None  Ideas of Reference:Paranoia; Delusions  Suicidal Thoughts:Suicidal Thoughts: No  Homicidal Thoughts:Homicidal Thoughts: No   Sensorium  Memory: Immediate Fair; Recent Poor; Remote Poor  Judgment: Impaired  Insight: Poor   Executive Functions  Concentration: Fair  Attention Span: Fair  Recall: Fair  Fund of Knowledge: Fair  Language: Fair   Psychomotor Activity  Psychomotor Activity: Psychomotor Activity: Normal   Assets  Assets: Communication Skills; Resilience; Financial Resources/Insurance   Sleep  Sleep: Sleep: Fair   Estimated Sleeping Duration (Last 24 Hours): 13.00-14.50 hours (Due to Daylight Saving Time, the durations displayed may not accurately represent documentation during the time change interval)  Nutritional Assessment (For OBS and FBC admissions only) Has the patient had a weight loss or gain of 10 pounds or more in the last 3 months?: No Has the patient had a decrease in food intake/or appetite?: No Does the patient have dental problems?: No Does the patient have eating habits or behaviors that may be indicators of an eating disorder including binging or inducing vomiting?: No Has the patient recently  lost weight without trying?: 0 Has the patient been eating poorly because of a decreased appetite?: 0 Malnutrition Screening Tool Score: 0    Physical Exam  Physical Exam Vitals and nursing note reviewed.  HENT:     Head: Normocephalic and atraumatic.     Right Ear: Tympanic membrane normal.     Left Ear: Tympanic membrane normal.     Nose: Nose normal.     Mouth/Throat:     Mouth: Mucous membranes are moist.  Eyes:     Extraocular Movements: Extraocular movements intact.     Pupils: Pupils are equal, round, and reactive to light.  Pulmonary:     Effort: Pulmonary effort is normal.  Musculoskeletal:        General: Normal range of motion.     Cervical back: Normal range of motion.  Neurological:     General: No focal deficit present.     Mental Status: [name] alert.    Review of Systems  Constitutional: Negative.   HENT: Negative.    Eyes: Negative.   Respiratory: Negative.    Cardiovascular: Negative.   Gastrointestinal: Negative.   Genitourinary: Negative.   Musculoskeletal: Negative.   Skin: Negative.   Neurological: Negative.   Endo/Heme/Allergies: Negative.   Psychiatric/Behavioral:  The patient is nervous/anxious.    Blood pressure 121/88, pulse 80, temperature 98.7 F (37.1 C), resp. rate 16, SpO2 98%. There is no height or weight on file to calculate  BMI.  Demographic Factors:  NA  Loss Factors: NA  Historical Factors: NA  Risk Reduction Factors:   Positive social support  Continued Clinical Symptoms:  Schizophrenia:   Paranoid or undifferentiated type Previous Psychiatric Diagnoses and Treatments Medical Diagnoses and Treatments/Surgeries  Cognitive Features That Contribute To Risk:  None    Suicide Risk:  Minimal: No identifiable suicidal ideation.  Patients presenting with no risk factors but with morbid ruminations; may be classified as minimal risk based on the severity of the depressive symptoms  Plan Of Care/Follow-up recommendations:  Activity:  As tolerated Diet:  Regular  Disposition: Admission to [facility] unit. Accepted by DR Rogelia, MD.   Randall Bouquet, NP 10/17/2024, 9:33 AM

## 2024-10-17 NOTE — BHH Suicide Risk Assessment (Signed)
 [facility] Admission Suicide Risk Assessment   Nursing information obtained from:    Demographic factors:    Current Mental Status:    Loss Factors:    Historical Factors:    Risk Reduction Factors:     Total Time spent with patient: 30 minutes Principal Problem: Schizophrenia, paranoid (HCC) Diagnosis:  Principal Problem:   Schizophrenia, paranoid (HCC)  Subjective Data: Patient is a 68 year old female with a history of Schizoaffective Disorder, Bipolar Type who presents via [employer] under IVC, initiated by CM, Falencio with re-entry program, to [facility] for assessment. Per IVC, [name] has been diagnosed with Schizophrenia and is refusing to take medication. [name] isn't sleeping. Residents report her screaming and yelling all night long.  [name] responding to internal stimuli.  [name] verbally aggressive.  The respondent has locked all of the residents inside of the transitional housing home.  They could not leave the home which prompted the IVC.  Residents did not have access to food, the restroom or anything as a result of being locked in.  [name] is currently under Adult protective services care . Patient is admitted to [facility] unit with Q15 min safety monitoring. Multidisciplinary team approach is offered. Medication management; group/milieu therapy is offered.   Continued Clinical Symptoms:    The Alcohol Use Disorders Identification Test, Guidelines for Use in Primary Care, Second Edition.  World Science Writer [facility]). Score between 0-7:  no or low risk or alcohol related problems. Score between 8-15:  moderate risk of alcohol related problems. Score between 16-19:  high risk of alcohol related problems. Score 20 or above:  warrants further diagnostic evaluation for alcohol dependence and treatment.   CLINICAL FACTORS:   Schizophrenia:   Paranoid or undifferentiated type   Musculoskeletal: Strength & Muscle Tone: within normal limits Gait &  Station: normal Patient leans: N/A  Psychiatric Specialty Exam:  Presentation  General Appearance:  Appropriate for Environment  Eye Contact: Fleeting  Speech: Normal Rate  Speech Volume: Decreased  Handedness: Right   Mood and Affect  Mood: Anxious; Irritable  Affect: Flat   Thought Process  Thought Processes: Disorganized  Descriptions of Associations:Loose  Orientation:Partial  Thought Content:Illogical; Paranoid Ideation  History of Schizophrenia/Schizoaffective disorder:Yes  Duration of Psychotic Symptoms:Greater than six months  Hallucinations:Hallucinations: None  Ideas of Reference:Delusions; Paranoia  Suicidal Thoughts:Suicidal Thoughts: No  Homicidal Thoughts:Homicidal Thoughts: No   Sensorium  Memory: Immediate Fair; Recent Fair  Judgment: Impaired  Insight: Shallow   Executive Functions  Concentration: Fair  Attention Span: Fair  Recall: Poor  Fund of Knowledge: Fair  Language: Fair   Psychomotor Activity  Psychomotor Activity: Psychomotor Activity: Normal   Assets  Assets: Communication Skills; Desire for Improvement; Social Support   Sleep  Sleep: Sleep: Poor    Physical Exam: Physical Exam Vitals and nursing note reviewed.    ROS There were no vitals taken for this visit. There is no height or weight on file to calculate BMI.   COGNITIVE FEATURES THAT CONTRIBUTE TO RISK:  None    SUICIDE RISK:   Minimal: No identifiable suicidal ideation.  Patients presenting with no risk factors but with morbid ruminations; may be classified as minimal risk based on the severity of the depressive symptoms  PLAN OF CARE: Patient is admitted to [facility] psych unit with Q15 min safety monitoring. Multidisciplinary team approach is offered. Medication management; group/milieu therapy is offered.   I certify that inpatient services furnished can reasonably be expected to improve  the patient's condition.   Allyn Foil, MD 10/17/2024, 11:59 AM

## 2024-10-17 NOTE — Group Note (Signed)
 Recreation Therapy Group Note   Group Topic:Relaxation  Group Date: 10/17/2024 Start Time: 1100 End Time: 1130 Facilitators: Celestia Jeoffrey BRAVO, LRT, CTRS Location: Dayroom  Group Description: PMR (Progressive Muscle Relaxation). LRT educates patients on what PMR is and the benefits that come from it. Patients are asked to sit with their feet flat on the floor while sitting up and all the way back in their chair, if possible. LRT and pts follow a prompt through a speaker that requires you to tense and release different muscles in their body and focus on their breathing. During session, lights are off and soft music is being played. Pts are given a stress ball to use if needed.   Goal Area(s) Addressed:  Patients will be able to describe progressive muscle relaxation.  Patient will practice using relaxation technique. Patient will identify a new coping skill.  Patient will follow multistep directions to reduce anxiety and stress.   Affect/Mood: N/A   Participation Level: Did not attend    Clinical Observations/Individualized Feedback: Patient did not attend group.   Plan: Continue to engage patient in RT group sessions 2-3x/week.   Jeoffrey BRAVO Celestia, LRT, CTRS 10/17/2024 1:28 PM

## 2024-10-17 NOTE — Tx Team (Signed)
 Initial Treatment Plan 10/17/2024 2:14 PM Shari [name] FMW:[id number]    PATIENT STRESSORS: Medication change or noncompliance     PATIENT STRENGTHS: Communication skills    PATIENT IDENTIFIED PROBLEMS:   I don't know why I am here.                   DISCHARGE CRITERIA:  Ability to meet basic life and health needs Adequate post-discharge living arrangements Improved stabilization in mood, thinking, and/or behavior Safe-care adequate arrangements made  PRELIMINARY DISCHARGE PLAN: Outpatient therapy Return to previous living arrangement  PATIENT/FAMILY INVOLVEMENT: This treatment plan has been presented to and reviewed with the patient, [name]. The patient has been given the opportunity to ask questions ans make suggestions.  Garen CINDERELLA Daring, RN 10/17/2024, 2:14 PM

## 2024-10-17 NOTE — Group Note (Signed)
 Date:  10/17/2024 Time:  2:21 PM  Group Topic/Focus:  Crisis Planning:   The purpose of this group is to help patients create a crisis plan for use upon discharge or in the future, as needed.    Participation Level:  Did Not Attend  [name] 10/17/2024, 2:21 PM

## 2024-10-17 NOTE — H&P (Signed)
 Psychiatric Admission Assessment Adult  Patient Identification: Shari [name] MRN:  [id number] Date of Evaluation:  10/17/2024 Chief Complaint:  Schizophrenia, paranoid (HCC) [F20.0]   History of Present Illness: Patient is a 68 year old female with a history of Schizoaffective Disorder, Bipolar Type who presents via [employer] under IVC, initiated by CM, Falencio with re-entry program, to [facility] for assessment. Per IVC, [name] has been diagnosed with Schizophrenia and is refusing to take medication. [name] isn't sleeping. Residents report her screaming and yelling all night long.  [name] responding to internal stimuli.  [name] verbally aggressive.  The respondent has locked all of the residents inside of the transitional housing home.  They could not leave the home which prompted the IVC.  Residents did not have access to food, the restroom or anything as a result of being locked in.  [name] is currently under Adult protective services care. Patient is admitted to [facility] unit with Q15 min safety monitoring. Multidisciplinary team approach is offered. Medication management; group/milieu therapy is offered.   On assessment today patient is noted to be resting in bed.  [name] able to answer some of the orientation questions as date as October 17, 2024, location as [place].  When asked about the events that led up to hospital visit she reports that 2 cops came to her house handcuffed her and brought her to the emergency room.  She then reports that she lives by herself in her own house and there were people coming and bothering her.  She then makes statements of people doing prostitution coming into the house, making the home filthy, doing drugs.  Patient reports spending time cleaning up the house.  Patient is not able to reflect back on her actions of locking up the residents of the transitional house that she has been staying at.  Patient clearly displays paranoid  delusions.  She reports feeling depressed but has fair appetite, has fair energy and motivation, denies having any weight changes.  Reports good appetite.  She denies SI/HI/plan.  She reports having anxiety but denies any panic attacks.  She denies any history of abuse and denies nightmares and flashbacks.  She denies recent or previous episodes of mania/hypomania.  She denies any use of alcohol or drugs but reports smoking half pack per day  Total Time spent with patient: 1 hour Sleep  Sleep:Sleep: Poor  Past Psychiatric History:  Psychiatric History:  Information collected from patient/chart  Prev Dx/Sx: Schizoaffective d/o, Depression Current Psych Provider: Not currently connected Home Meds (current): None in 8 months Previous Med Trials: Olanzapine ,  Therapy: denies   Prior Psych Hospitalization: Multiple  Prior Self Harm: Denies hx Prior Violence: denies   Family Psych History: denies Family Hx suicide: denies   Social History:  Developmental Hx: Unknown Educational Hx: Some college Occupational Hx: Unemployed [employer] Hx: denies Living Situation: homeless and living in a shelter Spiritual Hx: denies Access to weapons/lethal means: Denies    Substance History Alcohol: denies  Type of alcohol -n/a Last Drink -n/a Number of drinks per day -n/a History of alcohol withdrawal seizures -n/a History of DT's -n/a Tobacco: smokes about 3 cigarettes a day Illicit drugs: denies Prescription drug abuse: denies Rehab hx: denies   Is the patient at risk to self? No.  Has the patient been a risk to self in the past 6 months? No.  Has the patient been a risk to self within the distant past? Yes.  Is the patient a risk to others? No.  Has the patient been a risk to others in the past 6 months? No.  Has the patient been a risk to others within the distant past? No.   Columbia Scale:  Flowsheet Row ED from 10/14/2024 in [facility] ED to  Hosp-Admission (Discharged) from 03/13/2024 in [facility] ED from 03/06/2023 in [facility] Emergency Department at [facility]  C-SSRS RISK CATEGORY No Risk No Risk No Risk     Past Medical History:  Past Medical History:  Diagnosis Date   Eye globe prosthesis    GERD (gastroesophageal reflux disease)    History of blood transfusion 1973   related to abscess burst in my stomach   Hyperlipemia    Hyperlipidemia    Hypertension    Osteoarthritis of back    Lowerback    SVD (spontaneous vaginal delivery)    x 1, baby died at 2 wks of age   Type II diabetes mellitus (HCC)     Past Surgical History:  Procedure Laterality Date   APPENDECTOMY     COLONOSCOPY     DILATATION & CURETTAGE/HYSTEROSCOPY WITH MYOSURE N/A 02/25/2015   Procedure: DILATATION & CURETTAGE/HYSTEROSCOPY WITH MYOSURE;  Surgeon: Truman Corona, MD;  Location: [facility];  Service: Gynecology;  Laterality: N/A;   DILATION AND CURETTAGE OF UTERUS     ENUCLEATION Right 03/16/2017   ENUCLEATION Right 03/16/2017   Procedure: ENUCLEATION RIGHT EYE;  Surgeon: Loyd Kathryne Palm, MD;  Location: [facility] OR;  Service: Ophthalmology;  Laterality: Right;   EYE SURGERY     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6, 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   EYE SURGERY Right ~ 1974   S/P initial eye injury; scissors stuck in my eye   LAPAROSCOPIC CHOLECYSTECTOMY     SHOULDER ARTHROSCOPY WITH ROTATOR CUFF REPAIR Left    wire stitches per patient   SHOULDER ARTHROSCOPY WITH ROTATOR CUFF REPAIR Right    Family History:  Family History  Problem Relation Age of Onset   Diabetes Mother    CAD Mother    Lung cancer Father     Social History:  Social History   Substance and Sexual Activity  Alcohol Use Yes   Comment: beer occasionally     Social History   Substance and Sexual Activity  Drug Use No      Allergies:   Allergies  Allergen Reactions   Aspirin Hives and Itching   Chocolate Hives and Itching   Cocoa Itching and Rash    Penicillins Hives, Itching, Swelling and Other (See Comments)    Has patient had a PCN reaction causing IMMEDIATE RASH, FACIAL/TONGUE/THROAT SWELLING, SOB, OR LIGHTHEADEDNESS WITH HYPOTENSION:  #  #  #  YES  #  #  #  Has patient had a PCN reaction causing severe rash involving mucus membranes or skin necrosis: No Has patient had a PCN reaction that required hospitalization No Has patient had a PCN reaction occurring within the last 10 years: No If all of the above answers are NO, then may proceed with Cephalosporin use.    Sulfa Antibiotics Hives and Itching   Lab Results:  Results for orders placed or performed during the hospital encounter of 10/14/24 (from the past 48 hours)  Urinalysis, Routine w reflex microscopic -Urine, Clean Catch     Status: Abnormal   Collection Time: 10/16/24  5:57 PM  Result Value Ref Range   Color, Urine AMBER (  A) YELLOW    Comment: BIOCHEMICALS MAY BE AFFECTED BY COLOR   APPearance HAZY (A) CLEAR   Specific Gravity, Urine 1.025 1.005 - 1.030   pH 5.0 5.0 - 8.0   Glucose, UA NEGATIVE NEGATIVE mg/dL   Hgb urine dipstick SMALL (A) NEGATIVE   Bilirubin Urine NEGATIVE NEGATIVE   Ketones, ur NEGATIVE NEGATIVE mg/dL   Protein, ur 30 (A) NEGATIVE mg/dL   Nitrite NEGATIVE NEGATIVE   Leukocytes,Ua LARGE (A) NEGATIVE   RBC / HPF 11-20 0 - 5 RBC/hpf   WBC, UA 21-50 0 - 5 WBC/hpf   Bacteria, UA RARE (A) NONE SEEN   Squamous Epithelial / HPF 6-10 0 - 5 /HPF   Mucus PRESENT    Hyaline Casts, UA PRESENT    Non Squamous Epithelial 0-5 (A) NONE SEEN    Comment: Performed at [facility], [address], [place], [place] [address]  POCT Urine Drug Screen - (I-Screen)     Status: None   Collection Time: 10/16/24  5:57 PM  Result Value Ref Range   POC Amphetamine UR None Detected NONE DETECTED (Cut Off Level 1000 ng/mL)   POC Secobarbital (BAR) None Detected NONE DETECTED (Cut Off Level 300 ng/mL)   POC Buprenorphine (BUP) None Detected NONE DETECTED (Cut Off  Level 10 ng/mL)   POC Oxazepam (BZO) None Detected NONE DETECTED (Cut Off Level 300 ng/mL)   POC Cocaine UR None Detected NONE DETECTED (Cut Off Level 300 ng/mL)   POC Methamphetamine UR None Detected NONE DETECTED (Cut Off Level 1000 ng/mL)   POC Morphine  None Detected NONE DETECTED (Cut Off Level 300 ng/mL)   POC Methadone UR None Detected NONE DETECTED (Cut Off Level 300 ng/mL)   POC Oxycodone  UR None Detected NONE DETECTED (Cut Off Level 100 ng/mL)   POC Marijuana UR None Detected NONE DETECTED (Cut Off Level 50 ng/mL)    Blood Alcohol level:  Lab Results  Component Value Date   [facility] <15 10/14/2024   ETH <10 07/02/2022    Metabolic Disorder Labs:  Lab Results  Component Value Date   HGBA1C 5.5 10/14/2024   MPG 111.15 10/14/2024   MPG 137 01/06/2022   Lab Results  Component Value Date   PROLACTIN 5.8 10/14/2024   Lab Results  Component Value Date   CHOL 167 10/14/2024   TRIG 68 10/14/2024   HDL 60 10/14/2024   CHOLHDL 2.8 10/14/2024   VLDL 14 10/14/2024   LDLCALC 93 10/14/2024   LDLCALC 73 03/13/2024    Current Medications: Current Facility-Administered Medications  Medication Dose Route Frequency Provider Last Rate Last Admin   acetaminophen  (TYLENOL ) tablet 650 mg  650 mg Oral Q6H PRN Tex Drilling, NP       alum & mag hydroxide-simeth (MAALOX/MYLANTA) 200-200-20 MG/5ML suspension 30 mL  30 mL Oral Q4H PRN Nkwenti, Doris, NP       cloNIDine (CATAPRES) tablet 0.1 mg  0.1 mg Oral BID PRN Tex Drilling, NP       divalproex  (DEPAKOTE ) DR tablet 500 mg  500 mg Oral QHS Nkwenti, Doris, NP       losartan  (COZAAR ) tablet 25 mg  25 mg Oral Daily Nkwenti, Doris, NP       magnesium  hydroxide (MILK OF MAGNESIA) suspension 30 mL  30 mL Oral Daily PRN Nkwenti, Doris, NP       magnesium  oxide (MAG-OX) tablet 400 mg  400 mg Oral Daily Nkwenti, Doris, NP       methimazole  (TAPAZOLE ) tablet  10 mg  10 mg Oral Daily Nkwenti, Doris, NP       OLANZapine  (ZYPREXA ) injection 5 mg  5  mg Intramuscular TID PRN Tex Drilling, NP       OLANZapine  (ZYPREXA ) injection 5 mg  5 mg Intramuscular TID PRN Tex Drilling, NP       OLANZapine  (ZYPREXA ) tablet 10 mg  10 mg Oral QHS Nkwenti, Doris, NP       OLANZapine  zydis (ZYPREXA ) disintegrating tablet 5 mg  5 mg Oral TID PRN Tex Drilling, NP       OLANZapine  zydis (ZYPREXA ) disintegrating tablet 5 mg  5 mg Oral TID PRN Tex Drilling, NP       pantoprazole  (PROTONIX ) EC tablet 40 mg  40 mg Oral Daily Nkwenti, Doris, NP       traZODone  (DESYREL ) tablet 50 mg  50 mg Oral QHS PRN Tex Drilling, NP       PTA Medications: Medications Prior to Admission  Medication Sig Dispense Refill Last Dose/Taking   Ascorbic Acid (VITAMIN C PO) Take 1 tablet by mouth daily. (Patient not taking: Reported on 10/14/2024)      divalproex  (DEPAKOTE ) 500 MG DR tablet Take 1 tablet (500 mg total) by mouth at bedtime. (Patient not taking: Reported on 10/14/2024) 30 tablet 1    losartan  (COZAAR ) 25 MG tablet Take 1 tablet (25 mg total) by mouth daily. (Patient not taking: Reported on 10/14/2024) 90 tablet 1    methimazole  (TAPAZOLE ) 10 MG tablet Take 1 tablet (10 mg total) by mouth daily. (Patient not taking: Reported on 10/14/2024) 90 tablet 1    OLANZapine  (ZYPREXA ) 10 MG tablet Take 1 tablet (10 mg total) by mouth at bedtime. (Patient not taking: Reported on 10/14/2024) 30 tablet 1    pantoprazole  (PROTONIX ) 40 MG tablet Take 1 tablet (40 mg total) by mouth daily. (Patient not taking: Reported on 10/14/2024) 90 tablet 0    propranolol  (INDERAL ) 10 MG tablet Take 1 tablet (10 mg total) by mouth 2 (two) times daily. (Patient not taking: Reported on 10/14/2024) 180 tablet 1    VITAMIN D PO Take 1 tablet by mouth daily. (Patient not taking: Reported on 10/14/2024)      VITAMIN E PO Take 1 tablet by mouth daily. (Patient not taking: Reported on 10/14/2024)       Psychiatric Specialty Exam:  Presentation  General Appearance:  Appropriate for  Environment  Eye Contact: Fleeting  Speech: Normal Rate  Speech Volume: Decreased    Mood and Affect  Mood: Anxious; Irritable  Affect: Flat   Thought Process  Thought Processes: Disorganized  Descriptions of Associations:Loose  Orientation:Partial  Thought Content:Illogical; Paranoid Ideation  Hallucinations:Hallucinations: None  Ideas of Reference:Delusions; Paranoia  Suicidal Thoughts:Suicidal Thoughts: No  Homicidal Thoughts:Homicidal Thoughts: No   Sensorium  Memory: Immediate Fair; Recent Fair  Judgment: Impaired  Insight: Shallow   Executive Functions  Concentration: Fair  Attention Span: Fair  Recall: Poor  Fund of Knowledge: Fair  Language: Fair   Psychomotor Activity  Psychomotor Activity: Psychomotor Activity: Normal   Assets  Assets: Communication Skills; Desire for Improvement; Social Support    Musculoskeletal: Strength & Muscle Tone: within normal limits Gait & Station: normal  Physical Exam: Physical Exam Vitals and nursing note reviewed.  HENT:     Head: Normocephalic.     Nose: Nose normal.  Cardiovascular:     Rate and Rhythm: Normal rate.     Pulses: Normal pulses.  Pulmonary:     Effort: Pulmonary  effort is normal.  Neurological:     Mental Status: [name] alert.    Review of Systems  Constitutional: Negative.   HENT: Negative.    Eyes: Negative.   Cardiovascular: Negative.   Skin: Negative.    There were no vitals taken for this visit. There is no height or weight on file to calculate BMI.  Principal Diagnosis: Schizophrenia, paranoid (HCC) Diagnosis:  Principal Problem:   Schizophrenia, paranoid (HCC)   Clinical Decision Making:  Treatment Plan Summary:  Safety and Monitoring:             -- Involuntary admission to inpatient psychiatric unit for safety, stabilization and treatment             -- Daily contact with patient to assess and evaluate symptoms and progress in  treatment             -- Patient's case to be discussed in multi-disciplinary team meeting             -- Observation Level: q15 minute checks             -- Vital signs:  q12 hours             -- Precautions: suicide, elopement, and assault   2. Psychiatric Diagnoses and Treatment:              Increase Depakote  to 500 mg twice daily Continue Zyprexa  10 mg nightly     -- The risks/benefits/side-effects/alternatives to this medication were discussed in detail with the patient and time was given for questions. The patient consents to medication trial.                -- Metabolic profile and EKG monitoring obtained while on an atypical antipsychotic (BMI: Lipid Panel: HbgA1c: QTc:)              -- Encouraged patient to participate in unit milieu and in scheduled group therapies                            3. Medical Issues Being Addressed:      4. Discharge Planning:              -- Social work and case management to assist with discharge planning and identification of hospital follow-up needs prior to discharge             -- Estimated LOS: 5-7 days             -- Discharge Concerns: Need to establish a safety plan; Medication compliance and effectiveness             -- Discharge Goals: Return home with outpatient referrals follow ups  Physician Treatment Plan for Primary Diagnosis: Schizophrenia, paranoid (HCC) Long Term Goal(s): Improvement in symptoms so as ready for discharge  Short Term Goals: Ability to identify changes in lifestyle to reduce recurrence of condition will improve, Ability to verbalize feelings will improve, Ability to disclose and discuss suicidal ideas, Ability to demonstrate self-control will improve, Ability to identify and develop effective coping behaviors will improve, and Ability to maintain clinical measurements within normal limits will improve  Physician Treatment Plan for Secondary Diagnosis: Principal Problem:   Schizophrenia, paranoid (HCC)  Long  Term Goal(s): Improvement in symptoms so as ready for discharge  Short Term Goals: Ability to identify changes in lifestyle to reduce recurrence of condition will improve, Ability to verbalize feelings will  improve, Ability to disclose and discuss suicidal ideas, Ability to demonstrate self-control will improve, Ability to identify and develop effective coping behaviors will improve, and Ability to maintain clinical measurements within normal limits will improve  I certify that inpatient services furnished can reasonably be expected to improve the patient's condition.    Bronwyn Belasco, MD 11/13/202512:02 PM

## 2024-10-17 NOTE — Group Note (Signed)
 Recreation Therapy Group Note   Group Topic:General Recreation  Group Date: 10/17/2024 Start Time: 1500 End Time: 1535 Facilitators: Celestia Jeoffrey BRAVO, LRT, CTRS Location: Courtyard  Group Description: Outdoor Recreation. Patients had the option to play corn hole, ring toss, UNO, or listening to music while outside in the courtyard getting fresh air and sunlight. Patients helped water  and prune the raised garden beds. LRT and patients discussed things that they enjoy doing in their free time outside of the hospital. LRT encouraged patients to drink water  after being active and getting their heart rate up.   Goal Area(s) Addressed: Patient will identify leisure interests.  Patient will practice healthy decision making. Patient will engage in recreation activity.   Affect/Mood: N/A   Participation Level: Did not attend    Clinical Observations/Individualized Feedback: Patient did not attend group.   Plan: Continue to engage patient in RT group sessions 2-3x/week.   Jeoffrey BRAVO Celestia, LRT, CTRS 10/17/2024 4:07 PM

## 2024-10-18 DIAGNOSIS — F200 Paranoid schizophrenia: Secondary | ICD-10-CM | POA: Diagnosis not present

## 2024-10-18 NOTE — BH IP Treatment Plan (Signed)
 Interdisciplinary Treatment and Diagnostic Plan Update  10/18/2024 Time of Session: 11:10 AM  Shari [name] MRN: [id number]  Principal Diagnosis: Schizophrenia, paranoid (HCC)  Secondary Diagnoses: Principal Problem:   Schizophrenia, paranoid (HCC)   Current Medications:  Current Facility-Administered Medications  Medication Dose Route Frequency Provider Last Rate Last Admin   acetaminophen  (TYLENOL ) tablet 650 mg  650 mg Oral Q6H PRN Tex Drilling, NP   650 mg at 10/17/24 2141   alum & mag hydroxide-simeth (MAALOX/MYLANTA) 200-200-20 MG/5ML suspension 30 mL  30 mL Oral Q4H PRN Tex Drilling, NP       cloNIDine (CATAPRES) tablet 0.1 mg  0.1 mg Oral BID PRN Tex Drilling, NP       divalproex  (DEPAKOTE ) DR tablet 500 mg  500 mg Oral Q12H Jadapalle, Sree, MD   500 mg at 10/18/24 0940   losartan  (COZAAR ) tablet 25 mg  25 mg Oral Daily Nkwenti, Drilling, NP       magnesium  hydroxide (MILK OF MAGNESIA) suspension 30 mL  30 mL Oral Daily PRN Tex Drilling, NP       magnesium  oxide (MAG-OX) tablet 400 mg  400 mg Oral Daily Nkwenti, Doris, NP   400 mg at 10/18/24 0941   methimazole  (TAPAZOLE ) tablet 10 mg  10 mg Oral Daily Nkwenti, Doris, NP   10 mg at 10/18/24 0941   OLANZapine  (ZYPREXA ) injection 5 mg  5 mg Intramuscular TID PRN Tex Drilling, NP       OLANZapine  (ZYPREXA ) injection 5 mg  5 mg Intramuscular TID PRN Tex Drilling, NP       OLANZapine  (ZYPREXA ) tablet 10 mg  10 mg Oral QHS Nkwenti, Doris, NP   10 mg at 10/17/24 2141   OLANZapine  zydis (ZYPREXA ) disintegrating tablet 5 mg  5 mg Oral TID PRN Tex Drilling, NP       OLANZapine  zydis (ZYPREXA ) disintegrating tablet 5 mg  5 mg Oral TID PRN Tex Drilling, NP       pantoprazole  (PROTONIX ) EC tablet 40 mg  40 mg Oral Daily Nkwenti, Doris, NP   40 mg at 10/18/24 0941   traZODone  (DESYREL ) tablet 50 mg  50 mg Oral QHS PRN Tex Drilling, NP       PTA Medications: Medications Prior to Admission  Medication Sig Dispense Refill Last  Dose/Taking   Ascorbic Acid (VITAMIN C PO) Take 1 tablet by mouth daily. (Patient not taking: Reported on 10/14/2024)      divalproex  (DEPAKOTE ) 500 MG DR tablet Take 1 tablet (500 mg total) by mouth at bedtime. (Patient not taking: Reported on 10/14/2024) 30 tablet 1    losartan  (COZAAR ) 25 MG tablet Take 1 tablet (25 mg total) by mouth daily. (Patient not taking: Reported on 10/14/2024) 90 tablet 1    methimazole  (TAPAZOLE ) 10 MG tablet Take 1 tablet (10 mg total) by mouth daily. (Patient not taking: Reported on 10/14/2024) 90 tablet 1    OLANZapine  (ZYPREXA ) 10 MG tablet Take 1 tablet (10 mg total) by mouth at bedtime. (Patient not taking: Reported on 10/14/2024) 30 tablet 1    pantoprazole  (PROTONIX ) 40 MG tablet Take 1 tablet (40 mg total) by mouth daily. (Patient not taking: Reported on 10/14/2024) 90 tablet 0    propranolol  (INDERAL ) 10 MG tablet Take 1 tablet (10 mg total) by mouth 2 (two) times daily. (Patient not taking: Reported on 10/14/2024) 180 tablet 1    VITAMIN D PO Take 1 tablet by mouth daily. (Patient not taking: Reported on 10/14/2024)  VITAMIN E PO Take 1 tablet by mouth daily. (Patient not taking: Reported on 10/14/2024)       Patient Stressors: Medication change or noncompliance    Patient Strengths: Communication skills   Treatment Modalities: Medication Management, Group therapy, Case management,  1 to 1 session with clinician, Psychoeducation, Recreational therapy.   Physician Treatment Plan for Primary Diagnosis: Schizophrenia, paranoid (HCC) Long Term Goal(s): Improvement in symptoms so as ready for discharge   Short Term Goals: Ability to identify changes in lifestyle to reduce recurrence of condition will improve Ability to verbalize feelings will improve Ability to disclose and discuss suicidal ideas Ability to demonstrate self-control will improve Ability to identify and develop effective coping behaviors will improve Ability to maintain clinical  measurements within normal limits will improve  Medication Management: Evaluate patient's response, side effects, and tolerance of medication regimen.  Therapeutic Interventions: 1 to 1 sessions, Unit Group sessions and Medication administration.  Evaluation of Outcomes: Not Progressing  Physician Treatment Plan for Secondary Diagnosis: Principal Problem:   Schizophrenia, paranoid (HCC)  Long Term Goal(s): Improvement in symptoms so as ready for discharge   Short Term Goals: Ability to identify changes in lifestyle to reduce recurrence of condition will improve Ability to verbalize feelings will improve Ability to disclose and discuss suicidal ideas Ability to demonstrate self-control will improve Ability to identify and develop effective coping behaviors will improve Ability to maintain clinical measurements within normal limits will improve     Medication Management: Evaluate patient's response, side effects, and tolerance of medication regimen.  Therapeutic Interventions: 1 to 1 sessions, Unit Group sessions and Medication administration.  Evaluation of Outcomes: Not Progressing   RN Treatment Plan for Primary Diagnosis: Schizophrenia, paranoid (HCC) Long Term Goal(s): Knowledge of disease and therapeutic regimen to maintain health will improve  Short Term Goals: Ability to remain free from injury will improve, Ability to verbalize frustration and anger appropriately will improve, Ability to demonstrate self-control, Ability to participate in decision making will improve, Ability to verbalize feelings will improve, Ability to disclose and discuss suicidal ideas, Ability to identify and develop effective coping behaviors will improve, and Compliance with prescribed medications will improve  Medication Management: RN will administer medications as ordered by provider, will assess and evaluate patient's response and provide education to patient for prescribed medication. RN will  report any adverse and/or side effects to prescribing provider.  Therapeutic Interventions: 1 on 1 counseling sessions, Psychoeducation, Medication administration, Evaluate responses to treatment, Monitor vital signs and CBGs as ordered, Perform/monitor CIWA, COWS, AIMS and Fall Risk screenings as ordered, Perform wound care treatments as ordered.  Evaluation of Outcomes: Not Progressing   LCSW Treatment Plan for Primary Diagnosis: Schizophrenia, paranoid (HCC) Long Term Goal(s): Safe transition to appropriate next level of care at discharge, Engage patient in therapeutic group addressing interpersonal concerns.  Short Term Goals: Engage patient in aftercare planning with referrals and resources, Increase social support, Increase ability to appropriately verbalize feelings, Increase emotional regulation, Facilitate acceptance of mental health diagnosis and concerns, Facilitate patient progression through stages of change regarding substance use diagnoses and concerns, Identify triggers associated with mental health/substance abuse issues, and Increase skills for wellness and recovery  Therapeutic Interventions: Assess for all discharge needs, 1 to 1 time with Social worker, Explore available resources and support systems, Assess for adequacy in community support network, Educate family and significant other(s) on suicide prevention, Complete Psychosocial Assessment, Interpersonal group therapy.  Evaluation of Outcomes: Not Progressing   Progress in Treatment: Attending  groups: Yes. and No. Participating in groups: Yes. and No. Taking medication as prescribed: No. Toleration medication: No. Family/Significant other contact made: No, will contact:  CSW will contact if given permission  Patient understands diagnosis: No. Discussing patient identified problems/goals with staff: No. Medical problems stabilized or resolved: Yes. Denies suicidal/homicidal ideation: Yes. Issues/concerns per  patient self-inventory: No. Other: None   New problem(s) identified: No, Describe:  None identified   New Short Term/Long Term Goal(s): elimination of symptoms of psychosis, medication management for mood stabilization; elimination of SI thoughts; development of comprehensive mental wellness plan.   Patient Goals:  I haven't set any goals for the hospital yet. I don't need psychiatric help  Discharge Plan or Barriers: CSW will assist with appropriate discharge plan  Reason for Continuation of Hospitalization: Delusions  Medication stabilization  Estimated Length of Stay: 1 to 7 days  Last 3 [place] Suicide Severity Risk Score: Flowsheet Row Admission (Current) from 10/17/2024 in [facility] from 10/14/2024 in [facility] ED to Hosp-Admission (Discharged) from 03/13/2024 in [facility]  C-SSRS RISK CATEGORY No Risk No Risk No Risk    Last PHQ 2/9 Scores:     No data to display          Scribe for Treatment Team: Shari [name], Shari [name] 10/18/2024 2:39 PM

## 2024-10-18 NOTE — Plan of Care (Signed)
   Problem: Activity: Goal: Interest or engagement in activities will improve Outcome: Progressing   Problem: Coping: Goal: Ability to demonstrate self-control will improve Outcome: Progressing   Problem: Safety: Goal: Periods of time without injury will increase Outcome: Progressing

## 2024-10-18 NOTE — [facility] Suicide Risk Assessment (Signed)
 [facility] INPATIENT:  Family/Significant Other Suicide Prevention Education  Suicide Prevention Education:  Patient Refusal for Family/Significant Other Suicide Prevention Education: The patient Shari [name] has refused to provide written consent for family/significant other to be provided Family/Significant Other Suicide Prevention Education during admission and/or prior to discharge.  Physician notified.  SPE completed with pt, as pt refused to consent to family contact. SPI pamphlet provided to pt and pt was encouraged to share information with support network, ask questions, and talk about any concerns relating to SPE. Pt denies access to guns/firearms and verbalized understanding of information provided. Mobile Crisis information also provided to pt.    Alveta CHRISTELLA Kerns 10/18/2024, 10:07 AM

## 2024-10-18 NOTE — Group Note (Signed)
 Physical/Occupational Therapy Group Note  Group Topic: Pain Management and Coping   Group Date: 10/18/2024 Start Time: 1305 End Time: 1405 Facilitators: Clive Warren CROME, OT    Group Description:  Group discussed impact of chronic/acute pain on safety and independence with functional tasks and impact on mental health.  Identified and discussed any previously learned or implemented strategies used.  Discussed and reviewed cognitive behavioral pain coping strategies to address/improve overall management of pain. Discussed relaxation, distraction techniques, cognitive restructuring, activity pacing/energy conservation, environment/home safety modifications, and role of sleep and sleep hygiene. Allowed time for questions and further discussion.      Therapeutic Goal(s):   Identify and discuss previously utilized pain coping strategies and implications of pain on function/well-being Identify and discuss implementing new cognitive behavioral pain coping strategies into daily routines Demonstrate understanding and performance of learned cognitive behavioral pain coping strategies.    Individual Participation: Pt present for ~75% of session, participatory with prompting in group discussion and identifying strategies and preferences for distraction. Pt left the group near the end of the session prior to the relaxation exercise.   Participation Level: Moderate   Participation Quality: Minimal Cues   Behavior: Alert and Calm   Speech/Thought Process: Organized and Relevant   Affect/Mood: Flat   Insight: Fair   Judgement: Fair   Modes of Intervention: Activity, Clarification, Discussion, Education, Exploration, Problem-solving, Rapport Building, Socialization, and Support  Patient Response to Interventions:  Attentive and Receptive   Plan: Continue to engage patient in PT/OT groups 1 - 2x/week.  [name] R., MPH, MS, OTR/L ascom [phone] 10/18/24, 3:04 PM

## 2024-10-18 NOTE — [facility] Counselor (Signed)
 During assessment with CSW, patient declined outpatient follow up.   CSW team to continue to assess.   Armonie Mettler, MSW, LCSWA 10/18/2024 10:08 AM

## 2024-10-18 NOTE — Progress Notes (Signed)
   10/17/24 1958  Psych Admission Type (Psych Patients Only)  Admission Status Involuntary  Psychosocial Assessment  Patient Complaints None  Eye Contact Fair  Facial Expression Flat  Affect Flat  Speech Soft  Interaction Cautious  Motor Activity Slow  Appearance/Hygiene In scrubs  Behavior Characteristics Cooperative;Calm  Mood Pleasant  Thought Process  Coherency Disorganized  Content WDL  Delusions None reported or observed  Perception WDL  Hallucination None reported or observed  Judgment Impaired  Confusion None  Danger to Self  Current suicidal ideation? Denies  Agreement Not to Harm Self Yes  Description of Agreement Verbal  Danger to Others  Danger to Others None reported or observed

## 2024-10-18 NOTE — Progress Notes (Signed)
 Behavior:  Pleasant and cooperative.    Psych assessment:  Guarded with flat affect.  Denies SI/HI and AVH.    Interaction / Group attendance:  Present in the milieu for meals.  Minimal interaction. Attended OT group.   Medication/ PRNs: Compliant with encouragement.   Pain: Denies.  15 min checks in place for safety.

## 2024-10-18 NOTE — Progress Notes (Signed)
 [facility] MD Progress Note  10/18/2024 12:02 PM Shari [name]  MRN:  [id number] Patient is a 68 year old female with a history of Schizoaffective Disorder, Bipolar Type who presents via [employer] under IVC, initiated by CM, Falencio with re-entry program, to [facility] for assessment. Per IVC, [name] has been diagnosed with Schizophrenia and is refusing to take medication. [name] isn't sleeping. Residents report her screaming and yelling all night long.  [name] responding to internal stimuli.  [name] verbally aggressive.  The respondent has locked all of the residents inside of the transitional housing home.  They could not leave the home which prompted the IVC.  Residents did not have access to food, the restroom or anything as a result of being locked in.  [name] is currently under Adult protective services care. Patient is admitted to [facility] unit with Q15 min safety monitoring. Multidisciplinary team approach is offered. Medication management; group/milieu therapy is offered.   Subjective:  Chart reviewed, case discussed in multidisciplinary meeting, patient seen during rounds.  Patient met with the treatment team today.  She continues to remain delusional about the transition home being her apartment and the people living there or the perpetrators were prostituting and doing drugs in her house.  In spite of her history of schizophrenia she declines having any mental health history and refuses being on any psychotropic medications.  [name] able to talk about her medical history and the medications she takes for her high blood pressure and hypothyroidism up.  She remains superficial and denies auditory/visual hallucinations, she denies feeling depressed or anxious and denies SI/HI/plan.  Social work team is working with APS to look into possible guardianship as there is no legal next of kin identified at this time.   Past Psychiatric History: see h&P Family History:  Family  History  Problem Relation Age of Onset   Diabetes Mother    CAD Mother    Lung cancer Father    Social History:  Social History   Substance and Sexual Activity  Alcohol Use Yes   Comment: beer occasionally     Social History   Substance and Sexual Activity  Drug Use No    Social History   Socioeconomic History   Marital status: Widowed    Spouse name: Not on file   Number of children: Not on file   Years of education: Not on file   Highest education level: Not on file  Occupational History   Not on file  Tobacco Use   Smoking status: Every Day    Current packs/day: 0.50    Average packs/day: 0.5 packs/day for 40.0 years (20.0 ttl pk-yrs)    Types: Cigarettes   Smokeless tobacco: Never  Vaping Use   Vaping status: Never Used  Substance and Sexual Activity   Alcohol use: Yes    Comment: beer occasionally   Drug use: No   Sexual activity: Not Currently    Birth control/protection: Post-menopausal  Other Topics Concern   Not on file  Social History Narrative   Not on file   Social Drivers of Health   Financial Resource Strain: Not on file  Food Insecurity: No Food Insecurity (10/17/2024)   Hunger Vital Sign    Worried About Running Out of Food in the Last Year: Never true    Ran Out of Food in the Last Year: Never true  Transportation Needs: No Transportation Needs (10/17/2024)   PRAPARE - Transportation    Lack of  Transportation (Medical): No    Lack of Transportation (Non-Medical): No  Recent Concern: Transportation Needs - Unmet Transportation Needs (10/14/2024)   PRAPARE - Administrator, Civil Service (Medical): Yes    Lack of Transportation (Non-Medical): Yes  Physical Activity: Not on file  Stress: Not on file  Social Connections: Unknown (10/17/2024)   Social Connection and Isolation Panel    Frequency of Communication with Friends and Family: Patient unable to answer    Frequency of Social Gatherings with Friends and Family: Patient  unable to answer    Attends Religious Services: Patient unable to answer    Active Member of Clubs or Organizations: Patient declined    Attends Banker Meetings: Patient unable to answer    Marital Status: Widowed   Past Medical History:  Past Medical History:  Diagnosis Date   Eye globe prosthesis    GERD (gastroesophageal reflux disease)    History of blood transfusion 1973   related to abscess burst in my stomach   Hyperlipemia    Hyperlipidemia    Hypertension    Osteoarthritis of back    Lowerback    SVD (spontaneous vaginal delivery)    x 1, baby died at 2 wks of age   Type II diabetes mellitus (HCC)     Past Surgical History:  Procedure Laterality Date   APPENDECTOMY     COLONOSCOPY     DILATATION & CURETTAGE/HYSTEROSCOPY WITH MYOSURE N/A 02/25/2015   Procedure: DILATATION & CURETTAGE/HYSTEROSCOPY WITH MYOSURE;  Surgeon: Truman Corona, MD;  Location: [facility] OR[facility];  Service: Gynecology;  Laterality: N/A;   DILATION AND CURETTAGE OF UTERUS     ENUCLEATION Right 03/16/2017   ENUCLEATION Right 03/16/2017   Procedure: ENUCLEATION RIGHT EYE;  Surgeon: Loyd Kathryne Palm, MD;  Location: [facility] OR;  Service: Ophthalmology;  Laterality: Right;   EYE SURGERY     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6, 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   EYE SURGERY Right ~ 1974   S/P initial eye injury; scissors stuck in my eye   LAPAROSCOPIC CHOLECYSTECTOMY     SHOULDER ARTHROSCOPY WITH ROTATOR CUFF REPAIR Left    wire stitches per patient   SHOULDER ARTHROSCOPY WITH ROTATOR CUFF REPAIR Right     Current Medications: Current Facility-Administered Medications  Medication Dose Route Frequency Provider Last Rate Last Admin   acetaminophen  (TYLENOL ) tablet 650 mg  650 mg Oral Q6H PRN Tex Drilling, NP   650 mg at 10/17/24 2141   alum & mag hydroxide-simeth (MAALOX/MYLANTA) 200-200-20 MG/5ML suspension 30 mL  30 mL Oral Q4H PRN Tex Drilling, NP       cloNIDine (CATAPRES) tablet 0.1 mg  0.1 mg Oral BID PRN Tex Drilling, NP       divalproex  (DEPAKOTE ) DR tablet 500 mg  500 mg Oral Q12H Mahmoud Blazejewski, MD   500 mg at 10/18/24 0940   losartan  (COZAAR ) tablet 25 mg  25 mg Oral Daily Nkwenti, Doris, NP       magnesium  hydroxide (MILK OF MAGNESIA) suspension 30 mL  30 mL Oral Daily PRN Tex Drilling, NP       magnesium  oxide (MAG-OX) tablet 400 mg  400 mg Oral Daily Nkwenti, Doris, NP   400 mg at 10/18/24 0941   methimazole  (TAPAZOLE ) tablet 10 mg  10 mg Oral Daily Nkwenti, Doris, NP   10 mg at 10/18/24 0941   OLANZapine  (ZYPREXA ) injection 5 mg  5 mg Intramuscular TID PRN Tex Drilling, NP  OLANZapine  (ZYPREXA ) injection 5 mg  5 mg Intramuscular TID PRN Tex Drilling, NP       OLANZapine  (ZYPREXA ) tablet 10 mg  10 mg Oral QHS Tex Drilling, NP   10 mg at 10/17/24 2141   OLANZapine  zydis (ZYPREXA ) disintegrating tablet 5 mg  5 mg Oral TID PRN Tex Drilling, NP       OLANZapine  zydis (ZYPREXA ) disintegrating tablet 5 mg  5 mg Oral TID PRN Tex Drilling, NP       pantoprazole  (PROTONIX ) EC tablet 40 mg  40 mg Oral Daily Nkwenti, Doris, NP   40 mg at 10/18/24 0941   traZODone  (DESYREL ) tablet 50 mg  50 mg Oral QHS PRN Tex Drilling, NP        Lab Results:  Results for orders placed or performed during the hospital encounter of 10/17/24 (from the past 48 hours)  Valproic acid level     Status: Abnormal   Collection Time: 10/17/24 10:47 AM  Result Value Ref Range   Valproic Acid Lvl 28 (L) 50 - 100 ug/mL    Comment: Performed at [facility], [address] [place], [place] [address]    Blood Alcohol level:  Lab Results  Component Value Date   [facility] <15 10/14/2024   ETH <10 07/02/2022    Metabolic Disorder Labs: Lab Results  Component Value Date   HGBA1C 5.5 10/14/2024   MPG 111.15 10/14/2024   MPG 137 01/06/2022   Lab Results  Component Value Date   PROLACTIN 5.8 10/14/2024   Lab Results  Component Value Date   CHOL 167 10/14/2024   TRIG 68 10/14/2024   HDL 60  10/14/2024   CHOLHDL 2.8 10/14/2024   VLDL 14 10/14/2024   LDLCALC 93 10/14/2024   LDLCALC 73 03/13/2024    Physical Findings: AIMS:  , ,  ,  ,    CIWA:    COWS:      Psychiatric Specialty Exam:  Presentation  General Appearance:  Appropriate for Environment  Eye Contact: Fleeting  Speech: Normal Rate  Speech Volume: Decreased    Mood and Affect  Mood: Anxious; Irritable  Affect: Flat   Thought Process  Thought Processes: Disorganized  Orientation:Partial  Thought Content:Illogical; Paranoid Ideation  Hallucinations:Hallucinations: None  Ideas of Reference:Delusions; Paranoia  Suicidal Thoughts:Suicidal Thoughts: No  Homicidal Thoughts:Homicidal Thoughts: No   Sensorium  Memory: Immediate Fair; Recent Fair  Judgment: Impaired  Insight: Shallow   Executive Functions  Concentration: Fair  Attention Span: Fair  Recall: Poor  Fund of Knowledge: Fair  Language: Fair   Psychomotor Activity  Psychomotor Activity: Psychomotor Activity: Normal  Musculoskeletal: Strength & Muscle Tone: within normal limits Gait & Station: normal Assets  Assets: Manufacturing Systems Engineer; Desire for Improvement; Social Support    Physical Exam: Physical Exam ROS Blood pressure (!) 152/51, pulse 86, temperature 97.7 F (36.5 C), resp. rate 18, height 5' 8 (1.727 m), weight 48.3 kg, SpO2 100%. Body mass index is 16.19 kg/m.  Diagnosis: Principal Problem:   Schizophrenia, paranoid [facility])    Treatment Plan Summary:  Safety and Monitoring:             -- Involuntary admission to inpatient psychiatric unit for safety, stabilization and treatment             -- Daily contact with patient to assess and evaluate symptoms and progress in treatment             -- Patient's case to be discussed in multi-disciplinary team  meeting             -- Observation Level: q15 minute checks             -- Vital signs:  q12 hours             -- Precautions:  suicide, elopement, and assault   2. Psychiatric Diagnoses and Treatment:              Increase Depakote  to 500 mg twice daily Continue Zyprexa  10 mg nightly     -- The risks/benefits/side-effects/alternatives to this medication were discussed in detail with the patient and time was given for questions. The patient consents to medication trial.                -- Metabolic profile and EKG monitoring obtained while on an atypical antipsychotic (BMI: Lipid Panel: HbgA1c: QTc:)              -- Encouraged patient to participate in unit milieu and in scheduled group therapies  4. Discharge Planning:   -- Social work and case management to assist with discharge planning and identification of hospital follow-up needs prior to discharge  -- Estimated LOS: 3-4 days  Allyn Foil, MD 10/18/2024, 12:02 PM

## 2024-10-18 NOTE — Group Note (Signed)
 Date:  10/18/2024 Time:  10:27 AM  Group Topic/Focus:  Movement Therapy    Participation Level:  Did Not Attend   [name] 10/18/2024, 10:27 AM

## 2024-10-18 NOTE — Group Note (Signed)
 Recreation Therapy Group Note   Group Topic:Relaxation  Group Date: 10/18/2024 Start Time: 1055 End Time: 1120 Facilitators: Celestia Jeoffrey BRAVO, LRT, CTRS Location: Dayroom  Group Description: PMR (Progressive Muscle Relaxation). LRT educates patients on what PMR is and the benefits that come from it. Patients are asked to sit with their feet flat on the floor while sitting up and all the way back in their chair, if possible. LRT and pts follow a prompt through a speaker that requires you to tense and release different muscles in their body and focus on their breathing. During session, lights are off and soft music is being played. Pts are given a stress ball to use if needed.   Goal Area(s) Addressed:  Patients will be able to describe progressive muscle relaxation.  Patient will practice using relaxation technique. Patient will identify a new coping skill.  Patient will follow multistep directions to reduce anxiety and stress.    Affect/Mood: N/A   Participation Level: Did not attend    Clinical Observations/Individualized Feedback: Patient did not attend group.   Plan: Continue to engage patient in RT group sessions 2-3x/week.   Jeoffrey BRAVO Celestia, LRT, CTRS 10/18/2024 11:34 AM

## 2024-10-18 NOTE — Group Note (Signed)
 Date:  10/18/2024 Time:  8:58 PM  Group Topic/Focus:  Coping With Mental Health Crisis:   The purpose of this group is to help patients identify strategies for coping with mental health crisis.  Group discusses possible causes of crisis and ways to manage them effectively.    Participation Level:  Minimal  Participation Quality:  Redirectable  Affect:  Not Congruent  Cognitive:  Confused and Hallucinating  Insight: Limited  Engagement in Group:  Limited  Modes of Intervention:  Discussion  Additional Comments:    Shari [name] 10/18/2024, 8:58 PM

## 2024-10-18 NOTE — Group Note (Signed)
 Recreation Therapy Group Note   Group Topic:Emotion Expression  Group Date: 10/18/2024 Start Time: 1500 End Time: 1545 Facilitators: Celestia Jeoffrey BRAVO, LRT, CTRS Location: Courtyard  Group Description: Music. Patients are encouraged to name their favorite song(s) for LRT to play song through speaker for group to hear, while in the courtyard getting fresh air and sunlight. Patients educated on the definition of leisure and the importance of having different leisure interests outside of the hospital. Group discussed how leisure activities can often be used as pharmacologist and that listening to music and being outside are examples.    Goal Area(s) Addressed:  Patient will identify a current leisure interest.  Patient will practice making a positive decision. Patient will have the opportunity to try a new leisure activity.   Affect/Mood: N/A   Participation Level: Did not attend    Clinical Observations/Individualized Feedback: Patient did not attend group.   Plan: Continue to engage patient in RT group sessions 2-3x/week.   Jeoffrey BRAVO Celestia, LRT, CTRS 10/18/2024 5:14 PM

## 2024-10-18 NOTE — BHH Counselor (Signed)
 Adult Comprehensive Assessment  [name], female   DOB: 1956-08-28, 68 y.o.   MRN: [id number]  Information Source: Information source: Patient  Current Stressors:  Patient states their primary concerns and needs for treatment are:: I was sitting at home and two police knocked on the door and said they had to take me to the hospital. Patient states their goals for this hospitilization and ongoing recovery are:: Not really. Educational / Learning stressors: Patient denied. Employment / Job issues: I'm retired since 2013. Family Relationships: I do have problems with them but I haven't been around them in 5 years. Financial / Lack of resources (include bankruptcy): Patient denied. Housing / Lack of housing: Patient denied. Physical health (include injuries & life threatening diseases): Hyperthiroid. Social relationships: I don't have any friendships. Substance abuse: Patient reported drinking beers occasionally. Bereavement / Loss: Great great neice some months ago. I'm doing ok.  Living/Environment/Situation:  Living Arrangements: Alone Living conditions (as described by patient or guardian): WNL Who else lives in the home?: I was supposed to live alone but then they bought other people. How long has patient lived in current situation?: Since August. What is atmosphere in current home: Other (Comment)  Family History:  Marital status: Widowed Divorced, when?: My husband died in 14. Widowed, when?: In 1988. What types of issues is patient dealing with in the relationship?: Patient denied. Are you sexually active?: No What is your sexual orientation?: Unable to assess. Has your sexual activity been affected by drugs, alcohol, medication, or emotional stress?: Patient denied. Does patient have children?: No  Childhood History:  By whom was/is the patient raised?: Both parents Description of patient's relationship with caregiver when they were  a child: Good. Patient's description of current relationship with people who raised him/her: My parents are both deceased. How were you disciplined when you got in trouble as a child/adolescent?: Spanking. Does patient have siblings?: No Did patient suffer any verbal/emotional/physical/sexual abuse as a child?: No Did patient suffer from severe childhood neglect?: No Has patient ever been sexually abused/assaulted/raped as an adolescent or adult?: No Was the patient ever a victim of a crime or a disaster?: No Spoken with a professional about abuse?: No Witnessed domestic violence?: No Has patient been affected by domestic violence as an adult?: No  Education:  Highest grade of school patient has completed: Automotive Engineer. Currently a student?: No Learning disability?: No  Employment/Work Situation:   Employment Situation: Retired Passenger Transport Manager has Been Impacted by Current Illness: No What is the Longest Time Patient has Held a Job?: Unable to assess. Where was the Patient Employed at that Time?: [employer]. Has Patient ever Been in the [employer]?: No  Financial Resources:   Financial resources: [employer], Actor SSDI Does patient have a representative payee or guardian?: No  Alcohol/Substance Abuse:   What has been your use of drugs/alcohol within the last 12 months?: I drink beers every now and then. If attempted suicide, did drugs/alcohol play a role in this?: No Alcohol/Substance Abuse Treatment Hx: Denies past history Has alcohol/substance abuse ever caused legal problems?: Yes (I got two DWI's in my life.)  Social Support System:   Patient's Community Support System: Poor Describe Community Support System: No support. Type of faith/religion: Patient denied. How does patient's faith help to cope with current illness?: Patient denied.  Leisure/Recreation:   Do You Have Hobbies?: Yes Leisure and Hobbies: I like playing solitatre.  Strengths/Needs:    What is the patient's perception of their strengths?:  I'm proud I made it to the top. Patient states they can use these personal strengths during their treatment to contribute to their recovery: Patient reported it helps her to keep going. Patient states these barriers may affect/interfere with their treatment: None reported. Patient states these barriers may affect their return to the community: None reported. Other important information patient would like considered in planning for their treatment: Patient declined therapy and psychiatry.  Discharge Plan:   Currently receiving community mental health services: No Patient states concerns and preferences for aftercare planning are: Patient declined therapy and psychiatry. Patient states they will know when they are safe and ready for discharge when: I'm not going to hurt myself or anything. Does patient have access to transportation?: No Does patient have financial barriers related to discharge medications?: No Patient description of barriers related to discharge medications: Patient denied. Plan for no access to transportation at discharge: CSW to assist with transportation needs. Will patient be returning to same living situation after discharge?: Yes  Summary/Recommendations:   Summary and Recommendations (to be completed by the evaluator): Patient is a 68 year old woman from [place], [place] [facility] [place]) with a history of Schizoaffective Disorder, Bipolar Type who presented via GPD under IVC, initiated by CM, Falencio with re-entry program, to [facility] for assessment according to chart. During assessment with this clinical research associate, patient reported I was sitting at home and two police knocked on the door and said they had to take me to the hospital. Patient reported that she has been retired since "2013." When asked of stressors, patient reported family stressors explaining I do have problems with them but I haven't been  around them in 5 years. Regarding her physical health, patient reported Hyperthyroid. Patient reported drinking beers occasionally. When asked of bereavement stressors, patient reported that she lost her Great, great niece some months ago. I'm doing ok. Patient reported that she lives alone but later reported I was supposed to live alone but then they bought other people. Chart indicates that patient lives in a Group Home. Patient reported that she is widowed and reported My husband died in 14. Patient reported receiving poor support. Patient is not currently followed by a therapist or psychiatrist and declined outpatient follow up. Patient denied SI, HI and AVH. Patient's current diagnosis is Schizophrenia, paranoid (HCC). Recommendations include: crisis stabilization, therapeutic milieu, encourage group attendance and participation, medication management for mood stabilization and development of a comprehensive mental wellness plan.  Delsin Copen M Seddrick Flax. 10/18/2024

## 2024-10-19 DIAGNOSIS — F200 Paranoid schizophrenia: Secondary | ICD-10-CM | POA: Diagnosis not present

## 2024-10-19 NOTE — Group Note (Signed)
 Date:  10/19/2024 Time:  8:34 PM  Group Topic/Focus:  Identifying Needs:   The focus of this group is to help patients identify their personal needs that have been historically problematic and identify healthy behaviors to address their needs.    Participation Level:  Active  Participation Quality:  Appropriate  Affect:  Appropriate  Cognitive:  Appropriate  Insight: Appropriate  Engagement in Group:  Engaged  Modes of Intervention:  Education  Additional Comments:    Gabrella Stroh L 10/19/2024, 8:34 PM

## 2024-10-19 NOTE — Group Note (Signed)
 [facility] LCSW Group Therapy Note   Group Date: 10/19/2024 Start Time: 1030 End Time: 1115   Type of Therapy/Topic:  Group Therapy:  Balance in Life  Participation Level:  Did Not Attend   Description of Group:    This group will address the concept of balance and how it feels and looks when one is unbalanced. Patients will be encouraged to process areas in their lives that are out of balance, and identify reasons for remaining unbalanced. Facilitators will guide patients utilizing problem- solving interventions to address and correct the stressor making their life unbalanced. Understanding and applying boundaries will be explored and addressed for obtaining  and maintaining a balanced life. Patients will be encouraged to explore ways to assertively make their unbalanced needs known to significant others in their lives, using other group members and facilitator for support and feedback.  Therapeutic Goals: Patient will identify two or more emotions or situations they have that consume much of in their lives. Patient will identify signs/triggers that life has become out of balance:  Patient will identify two ways to set boundaries in order to achieve balance in their lives:  Patient will demonstrate ability to communicate their needs through discussion and/or role plays  Summary of Patient Progress:  Patient did not attend Group      Therapeutic Modalities:   Cognitive Behavioral Therapy Solution-Focused Therapy Assertiveness Training   [name], LCSWA

## 2024-10-19 NOTE — Progress Notes (Signed)
 [facility] MD Progress Note  10/19/2024 7:39 PM Shari [name]  MRN:  [id number] Patient is a 68 year old female with a history of Schizoaffective Disorder, Bipolar Type who presents via GPD under IVC, initiated by CM, Falencio with re-entry program, to [facility] for assessment. Per IVC, [name] has been diagnosed with Schizophrenia and is refusing to take medication. [name] isn't sleeping. Residents report her screaming and yelling all night long.  [name] responding to internal stimuli.  [name] verbally aggressive.  The respondent has locked all of the residents inside of the transitional housing home.  They could not leave the home which prompted the IVC.  Residents did not have access to food, the restroom or anything as a result of being locked in.  [name] is currently under Adult protective services care. Patient is admitted to [facility] unit with Q15 min safety monitoring. Multidisciplinary team approach is offered. Medication management; group/milieu therapy is offered.   Subjective:  Chart reviewed, case discussed in multidisciplinary meeting, patient seen during rounds.  Patient met with the treatment team today.  She continues to remain delusional about the transition home being her apartment and the people living there or the perpetrators were prostituting and doing drugs in her house.  In spite of her history of schizophrenia she declines having any mental health history and refuses being on any psychotropic medications.  [name] able to talk about her medical history and the medications she takes for her high blood pressure and hypothyroidism up.  She remains superficial and denies auditory/visual hallucinations, she denies feeling depressed or anxious and denies SI/HI/plan.  Social work team is working with APS to look into possible guardianship as there is no legal next of kin identified at this time.   Past Psychiatric History: see h&P Family History:  Family  History  Problem Relation Age of Onset   Diabetes Mother    CAD Mother    Lung cancer Father    Social History:  Social History   Substance and Sexual Activity  Alcohol Use Yes   Comment: beer occasionally     Social History   Substance and Sexual Activity  Drug Use No    Social History   Socioeconomic History   Marital status: Widowed    Spouse name: Not on file   Number of children: Not on file   Years of education: Not on file   Highest education level: Not on file  Occupational History   Not on file  Tobacco Use   Smoking status: Every Day    Current packs/day: 0.50    Average packs/day: 0.5 packs/day for 40.0 years (20.0 ttl pk-yrs)    Types: Cigarettes   Smokeless tobacco: Never  Vaping Use   Vaping status: Never Used  Substance and Sexual Activity   Alcohol use: Yes    Comment: beer occasionally   Drug use: No   Sexual activity: Not Currently    Birth control/protection: Post-menopausal  Other Topics Concern   Not on file  Social History Narrative   Not on file   Social Drivers of Health   Financial Resource Strain: Not on file  Food Insecurity: No Food Insecurity (10/17/2024)   Hunger Vital Sign    Worried About Running Out of Food in the Last Year: Never true    Ran Out of Food in the Last Year: Never true  Transportation Needs: No Transportation Needs (10/17/2024)   PRAPARE - Transportation    Lack of  Transportation (Medical): No    Lack of Transportation (Non-Medical): No  Recent Concern: Transportation Needs - Unmet Transportation Needs (10/14/2024)   PRAPARE - Administrator, Civil Service (Medical): Yes    Lack of Transportation (Non-Medical): Yes  Physical Activity: Not on file  Stress: Not on file  Social Connections: Unknown (10/17/2024)   Social Connection and Isolation Panel    Frequency of Communication with Friends and Family: Patient unable to answer    Frequency of Social Gatherings with Friends and Family: Patient  unable to answer    Attends Religious Services: Patient unable to answer    Active Member of Clubs or Organizations: Patient declined    Attends Banker Meetings: Patient unable to answer    Marital Status: Widowed   Past Medical History:  Past Medical History:  Diagnosis Date   Eye globe prosthesis    GERD (gastroesophageal reflux disease)    History of blood transfusion 1973   related to abscess burst in my stomach   Hyperlipemia    Hyperlipidemia    Hypertension    Osteoarthritis of back    Lowerback    SVD (spontaneous vaginal delivery)    x 1, baby died at 2 wks of age   Type II diabetes mellitus (HCC)     Past Surgical History:  Procedure Laterality Date   APPENDECTOMY     COLONOSCOPY     DILATATION & CURETTAGE/HYSTEROSCOPY WITH MYOSURE N/A 02/25/2015   Procedure: DILATATION & CURETTAGE/HYSTEROSCOPY WITH MYOSURE;  Surgeon: Truman Corona, MD;  Location: [facility] OR[facility];  Service: Gynecology;  Laterality: N/A;   DILATION AND CURETTAGE OF UTERUS     ENUCLEATION Right 03/16/2017   ENUCLEATION Right 03/16/2017   Procedure: ENUCLEATION RIGHT EYE;  Surgeon: Loyd Kathryne Palm, MD;  Location: [facility] OR;  Service: Ophthalmology;  Laterality: Right;   EYE SURGERY     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6, 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   EYE SURGERY Right ~ 1974   S/P initial eye injury; scissors stuck in my eye   LAPAROSCOPIC CHOLECYSTECTOMY     SHOULDER ARTHROSCOPY WITH ROTATOR CUFF REPAIR Left    wire stitches per patient   SHOULDER ARTHROSCOPY WITH ROTATOR CUFF REPAIR Right     Current Medications: Current Facility-Administered Medications  Medication Dose Route Frequency Provider Last Rate Last Admin   acetaminophen  (TYLENOL ) tablet 650 mg  650 mg Oral Q6H PRN Tex Drilling, NP   650 mg at 10/19/24 1432   alum & mag hydroxide-simeth (MAALOX/MYLANTA) 200-200-20 MG/5ML suspension 30 mL  30 mL Oral Q4H PRN Nkwenti, Doris, NP       cloNIDine (CATAPRES) tablet 0.1 mg  0.1 mg Oral BID PRN Tex Drilling, NP       divalproex  (DEPAKOTE ) DR tablet 500 mg  500 mg Oral Q12H Jadapalle, Sree, MD   500 mg at 10/19/24 9075   losartan  (COZAAR ) tablet 25 mg  25 mg Oral Daily Nkwenti, Doris, NP   25 mg at 10/19/24 9075   magnesium  hydroxide (MILK OF MAGNESIA) suspension 30 mL  30 mL Oral Daily PRN Tex Drilling, NP       methimazole  (TAPAZOLE ) tablet 10 mg  10 mg Oral Daily Nkwenti, Doris, NP   10 mg at 10/19/24 9075   OLANZapine  (ZYPREXA ) injection 5 mg  5 mg Intramuscular TID PRN Tex Drilling, NP       OLANZapine  (ZYPREXA ) injection 5 mg  5 mg Intramuscular TID PRN Tex Drilling, NP  OLANZapine  (ZYPREXA ) tablet 10 mg  10 mg Oral QHS Tex Drilling, NP   10 mg at 10/18/24 2054   OLANZapine  zydis (ZYPREXA ) disintegrating tablet 5 mg  5 mg Oral TID PRN Tex Drilling, NP       OLANZapine  zydis (ZYPREXA ) disintegrating tablet 5 mg  5 mg Oral TID PRN Tex Drilling, NP       pantoprazole  (PROTONIX ) EC tablet 40 mg  40 mg Oral Daily Nkwenti, Doris, NP   40 mg at 10/19/24 9075   traZODone  (DESYREL ) tablet 50 mg  50 mg Oral QHS PRN Tex Drilling, NP        Lab Results:  No results found for this or any previous visit (from the past 48 hours).   Blood Alcohol level:  Lab Results  Component Value Date   [facility] <15 10/14/2024   ETH <10 07/02/2022    Metabolic Disorder Labs: Lab Results  Component Value Date   HGBA1C 5.5 10/14/2024   MPG 111.15 10/14/2024   MPG 137 01/06/2022   Lab Results  Component Value Date   PROLACTIN 5.8 10/14/2024   Lab Results  Component Value Date   CHOL 167 10/14/2024   TRIG 68 10/14/2024   HDL 60 10/14/2024   CHOLHDL 2.8 10/14/2024   VLDL 14 10/14/2024   LDLCALC 93 10/14/2024   LDLCALC 73 03/13/2024    Physical Findings: AIMS:  , ,  ,  ,    CIWA:    COWS:      Psychiatric Specialty Exam:  Presentation  General Appearance:  Appropriate for Environment  Eye Contact: Fleeting  Speech: Normal Rate  Speech Volume: Decreased    Mood  and Affect  Mood: Anxious; Irritable  Affect: Flat   Thought Process  Thought Processes: Disorganized  Orientation:Partial  Thought Content:Illogical; Paranoid Ideation  Hallucinations:No data recorded  Ideas of Reference:Delusions; Paranoia  Suicidal Thoughts:No data recorded  Homicidal Thoughts:No data recorded   Sensorium  Memory: Immediate Fair; Recent Fair  Judgment: Impaired  Insight: Shallow   Executive Functions  Concentration: Fair  Attention Span: Fair  Recall: Poor  Fund of Knowledge: Fair  Language: Fair   Psychomotor Activity  Psychomotor Activity: No data recorded  Musculoskeletal: Strength & Muscle Tone: within normal limits Gait & Station: normal Assets  Assets: Manufacturing Systems Engineer; Desire for Improvement; Social Support    Physical Exam: Physical Exam ROS Blood pressure (!) 122/92, pulse 70, temperature (!) 97.5 F (36.4 C), resp. rate 16, height 5' 8 (1.727 m), weight 48.3 kg, SpO2 100%. Body mass index is 16.19 kg/m.  Diagnosis: Principal Problem:   Schizophrenia, paranoid (HCC)    Treatment Plan Summary:  Safety and Monitoring:             -- Involuntary admission to inpatient psychiatric unit for safety, stabilization and treatment             -- Daily contact with patient to assess and evaluate symptoms and progress in treatment             -- Patient's case to be discussed in multi-disciplinary team meeting             -- Observation Level: q15 minute checks             -- Vital signs:  q12 hours             -- Precautions: suicide, elopement, and assault   2. Psychiatric Diagnoses and Treatment:  Increase Depakote  to 500 mg twice daily Continue Zyprexa  10 mg nightly     -- The risks/benefits/side-effects/alternatives to this medication were discussed in detail with the patient and time was given for questions. The patient consents to medication trial.                -- Metabolic  profile and EKG monitoring obtained while on an atypical antipsychotic (BMI: Lipid Panel: HbgA1c: QTc:)              -- Encouraged patient to participate in unit milieu and in scheduled group therapies  4. Discharge Planning:   -- Social work and case management to assist with discharge planning and identification of hospital follow-up needs prior to discharge  -- Estimated LOS: 3-4 days  Millie JONELLE Manners, MD 10/19/2024, 7:39 PM

## 2024-10-19 NOTE — Progress Notes (Signed)
   10/18/24 2235  Psych Admission Type (Psych Patients Only)  Admission Status Involuntary  Psychosocial Assessment  Patient Complaints None  Eye Contact Fair  Facial Expression Flat  Affect Preoccupied  Speech Soft  Interaction Guarded  Motor Activity Slow  Appearance/Hygiene In scrubs  Behavior Characteristics Cooperative;Appropriate to situation  Mood Suspicious  Thought Process  Coherency Disorganized  Content WDL  Delusions None reported or observed  Perception WDL  Hallucination None reported or observed  Judgment Impaired  Confusion Mild  Danger to Self  Current suicidal ideation? Denies  Description of Suicide Plan n/a  Agreement Not to Harm Self Yes  Description of Agreement verbal  Danger to Others  Danger to Others None reported or observed    Estimated Sleeping Duration (Last 24 Hours): 8.50-10.00 hours (Due to Daylight Saving Time, the durations displayed may not accurately represent documentation during the time change interval)   (Sleep Hours) - 8.5 - 10 hrs (Any PRNs that were needed, meds refused, or side effects to meds)-  Received PRN tylenol  650mg  P.O. at 2055 for c/o shoulder pain, noted to be effective. (Any disturbances and when (visitation, over night)- None noted (Concerns raised by the patient)- None noted (SI/HI/AVH)- Denied all, but visible noted/ heard responding to internal stimuli.

## 2024-10-19 NOTE — Group Note (Signed)
 Date:  10/19/2024 Time:  5:13 PM  Group Topic/Focus:  Overcoming Stress:   The focus of this group is to define stress and help patients assess their triggers.    Participation Level:  Did Not Attend  Participation Quality:  none, did not attend  Affect:  did not attend  Cognitive:  did not attend  Insight: None  Engagement in Group:  did not attend  Modes of Intervention:  Activity and Socialization  Additional Comments:    Jarrad Mclees S.,RN 10/19/2024, 5:13 PM

## 2024-10-19 NOTE — Plan of Care (Signed)
  Problem: Activity: Goal: Sleeping patterns will improve Outcome: Progressing   Problem: Coping: Goal: Ability to demonstrate self-control will improve Outcome: Progressing   

## 2024-10-19 NOTE — Progress Notes (Signed)
 D- Patient alert and oriented x 3.Patient disoriented to current situation. Patient pleasant and cooperative. Denies SI, HI, AVH, and pain. Patient states she does not have any goal for today.  A- Scheduled and PRN medications administered to patient, per MD orders. Support and encouragement provided.  Routine safety checks conducted every 15 minutes.  Patient informed to notify staff with problems or concerns.  R- No adverse drug reactions noted. Patient contracts for safety at this time. Patient compliant with medications and treatment plan. Patient receptive, calm, and cooperative. Patient interacts well with others on the unit.  Patient remains safe at this time.    Caelyn Route S.,RN

## 2024-10-19 NOTE — Plan of Care (Signed)
  Problem: Education: Goal: Emotional status will improve Outcome: Progressing   Problem: Coping: Goal: Ability to demonstrate self-control will improve Outcome: Progressing   Problem: Safety: Goal: Periods of time without injury will increase Outcome: Progressing   Problem: Education: Goal: Verbalization of understanding the information provided will improve Outcome: Not Progressing   Problem: Activity: Goal: Interest or engagement in activities will improve Outcome: Not Progressing

## 2024-10-19 NOTE — Group Note (Signed)
 Date:  10/19/2024 Time:  3:26 PM  Group Topic/Focus:  Fresh air Therapy with cool music and conversation    Participation Level:  Active  Participation Quality:  Appropriate  Affect:  Appropriate  Cognitive:  Appropriate  Insight: Appropriate  Engagement in Group:  Engaged  Modes of Intervention:  Socialization  Additional Comments:  none  [name] 10/19/2024, 3:26 PM

## 2024-10-19 NOTE — Group Note (Signed)
 Date:  10/19/2024 Time:  10:52 AM  Group Topic/Focus:  Coping With Mental Health Crisis:   The purpose of this group is to help patients identify strategies for coping with mental health crisis.  Group discusses possible causes of crisis and ways to manage them effectively.    Participation Level:  Did Not Attend  Leigh VEAR Pais 10/19/2024, 10:52 AM

## 2024-10-20 DIAGNOSIS — F200 Paranoid schizophrenia: Secondary | ICD-10-CM | POA: Diagnosis not present

## 2024-10-20 NOTE — Progress Notes (Signed)
 [facility] MD Progress Note  10/20/2024 12:43 PM Shari [name]  MRN:  [id number] Patient is a 68 year old female with a history of Schizoaffective Disorder, Bipolar Type who presents via [employer] under IVC, initiated by CM, Falencio with re-entry program, to [facility] for assessment. Per IVC, [name] has been diagnosed with Schizophrenia and is refusing to take medication. [name] isn't sleeping. Residents report her screaming and yelling all night long.  [name] responding to internal stimuli.  [name] verbally aggressive.  The respondent has locked all of the residents inside of the transitional housing home.  They could not leave the home which prompted the IVC.  Residents did not have access to food, the restroom or anything as a result of being locked in.  [name] is currently under Adult protective services care. Patient is admitted to [facility] unit with Q15 min safety monitoring. Multidisciplinary team approach is offered. Medication management; group/milieu therapy is offered.   Subjective:  Chart reviewed, case discussed in multidisciplinary meeting, patient seen during rounds.      Patient has not shown any aggressive behaviors she wants to go home waiting for placement options no PRNs given says such.   Past Psychiatric History: see h&P Family History:  Family History  Problem Relation Age of Onset   Diabetes Mother    CAD Mother    Lung cancer Father    Social History:  Social History   Substance and Sexual Activity  Alcohol Use Yes   Comment: beer occasionally     Social History   Substance and Sexual Activity  Drug Use No    Social History   Socioeconomic History   Marital status: Widowed    Spouse name: Not on file   Number of children: Not on file   Years of education: Not on file   Highest education level: Not on file  Occupational History   Not on file  Tobacco Use   Smoking status: Every Day    Current packs/day: 0.50    Average  packs/day: 0.5 packs/day for 40.0 years (20.0 ttl pk-yrs)    Types: Cigarettes   Smokeless tobacco: Never  Vaping Use   Vaping status: Never Used  Substance and Sexual Activity   Alcohol use: Yes    Comment: beer occasionally   Drug use: No   Sexual activity: Not Currently    Birth control/protection: Post-menopausal  Other Topics Concern   Not on file  Social History Narrative   Not on file   Social Drivers of Health   Financial Resource Strain: Not on file  Food Insecurity: No Food Insecurity (10/17/2024)   Hunger Vital Sign    Worried About Running Out of Food in the Last Year: Never true    Ran Out of Food in the Last Year: Never true  Transportation Needs: No Transportation Needs (10/17/2024)   PRAPARE - Administrator, Civil Service (Medical): No    Lack of Transportation (Non-Medical): No  Recent Concern: Transportation Needs - Unmet Transportation Needs (10/14/2024)   PRAPARE - Administrator, Civil Service (Medical): Yes    Lack of Transportation (Non-Medical): Yes  Physical Activity: Not on file  Stress: Not on file  Social Connections: Unknown (10/17/2024)   Social Connection and Isolation Panel    Frequency of Communication with Friends and Family: Patient unable to answer    Frequency of Social Gatherings with Friends and Family: Patient unable to answer    Attends  Religious Services: Patient unable to answer    Active Member of Clubs or Organizations: Patient declined    Attends Banker Meetings: Patient unable to answer    Marital Status: Widowed   Past Medical History:  Past Medical History:  Diagnosis Date   Eye globe prosthesis    GERD (gastroesophageal reflux disease)    History of blood transfusion 1973   related to abscess burst in my stomach   Hyperlipemia    Hyperlipidemia    Hypertension    Osteoarthritis of back    Lowerback    SVD (spontaneous vaginal delivery)    x 1, baby died at 2 wks of age    Type II diabetes mellitus (HCC)     Past Surgical History:  Procedure Laterality Date   APPENDECTOMY     COLONOSCOPY     DILATATION & CURETTAGE/HYSTEROSCOPY WITH MYOSURE N/A 02/25/2015   Procedure: DILATATION & CURETTAGE/HYSTEROSCOPY WITH MYOSURE;  Surgeon: Truman Corona, MD;  Location: [facility];  Service: Gynecology;  Laterality: N/A;   DILATION AND CURETTAGE OF UTERUS     ENUCLEATION Right 03/16/2017   ENUCLEATION Right 03/16/2017   Procedure: ENUCLEATION RIGHT EYE;  Surgeon: Loyd Kathryne Palm, MD;  Location: [facility] OR;  Service: Ophthalmology;  Laterality: Right;   EYE SURGERY     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6, 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   EYE SURGERY Right ~ 1974   S/P initial eye injury; scissors stuck in my eye   LAPAROSCOPIC CHOLECYSTECTOMY     SHOULDER ARTHROSCOPY WITH ROTATOR CUFF REPAIR Left    wire stitches per patient   SHOULDER ARTHROSCOPY WITH ROTATOR CUFF REPAIR Right     Current Medications: Current Facility-Administered Medications  Medication Dose Route Frequency Provider Last Rate Last Admin   acetaminophen  (TYLENOL ) tablet 650 mg  650 mg Oral Q6H PRN Tex Drilling, NP   650 mg at 10/19/24 2130   alum & mag hydroxide-simeth (MAALOX/MYLANTA) 200-200-20 MG/5ML suspension 30 mL  30 mL Oral Q4H PRN Tex Drilling, NP       cloNIDine (CATAPRES) tablet 0.1 mg  0.1 mg Oral BID PRN Tex Drilling, NP       divalproex  (DEPAKOTE ) DR tablet 500 mg  500 mg Oral Q12H Jadapalle, Sree, MD   500 mg at 10/20/24 9087   losartan  (COZAAR ) tablet 25 mg  25 mg Oral Daily Nkwenti, Doris, NP   25 mg at 10/20/24 0913   magnesium  hydroxide (MILK OF MAGNESIA) suspension 30 mL  30 mL Oral Daily PRN Tex Drilling, NP       methimazole  (TAPAZOLE ) tablet 10 mg  10 mg Oral Daily Nkwenti, Doris, NP   10 mg at 10/20/24 9087   OLANZapine  (ZYPREXA ) injection 5 mg  5 mg Intramuscular TID PRN Tex Drilling, NP       OLANZapine  (ZYPREXA ) injection 5 mg  5 mg Intramuscular TID PRN Tex Drilling, NP       OLANZapine   (ZYPREXA ) tablet 10 mg  10 mg Oral QHS Nkwenti, Doris, NP   10 mg at 10/19/24 2117   OLANZapine  zydis (ZYPREXA ) disintegrating tablet 5 mg  5 mg Oral TID PRN Tex Drilling, NP       OLANZapine  zydis (ZYPREXA ) disintegrating tablet 5 mg  5 mg Oral TID PRN Tex Drilling, NP       pantoprazole  (PROTONIX ) EC tablet 40 mg  40 mg Oral Daily Nkwenti, Doris, NP   40 mg at 10/20/24 0913   traZODone  (DESYREL ) tablet 50 mg  50 mg Oral QHS PRN Tex Drilling, NP        Lab Results:  No results found for this or any previous visit (from the past 48 hours).   Blood Alcohol level:  Lab Results  Component Value Date   [facility] <15 10/14/2024   ETH <10 07/02/2022    Metabolic Disorder Labs: Lab Results  Component Value Date   HGBA1C 5.5 10/14/2024   MPG 111.15 10/14/2024   MPG 137 01/06/2022   Lab Results  Component Value Date   PROLACTIN 5.8 10/14/2024   Lab Results  Component Value Date   CHOL 167 10/14/2024   TRIG 68 10/14/2024   HDL 60 10/14/2024   CHOLHDL 2.8 10/14/2024   VLDL 14 10/14/2024   LDLCALC 93 10/14/2024   LDLCALC 73 03/13/2024    Physical Findings: AIMS:  , ,  ,  ,    CIWA:    COWS:      Psychiatric Specialty Exam:  Presentation  General Appearance:  Appropriate for Environment  Eye Contact: Fleeting  Speech: Normal Rate  Speech Volume: Decreased    Mood and Affect  Mood: Anxious; Irritable  Affect: Flat   Thought Process  Thought Processes: Disorganized  Orientation:Partial  Thought Content:Illogical; Paranoid Ideation  Hallucinations:No data recorded  Ideas of Reference:Delusions; Paranoia  Suicidal Thoughts:No data recorded  Homicidal Thoughts:No data recorded   Sensorium  Memory: Immediate Fair; Recent Fair  Judgment: Impaired  Insight: Shallow   Executive Functions  Concentration: Fair  Attention Span: Fair  Recall: Poor  Fund of Knowledge: Fair  Language: Fair   Psychomotor Activity  Psychomotor  Activity: No data recorded  Musculoskeletal: Strength & Muscle Tone: within normal limits Gait & Station: normal Assets  Assets: Manufacturing Systems Engineer; Desire for Improvement; Social Support    Physical Exam: Physical Exam ROS Blood pressure 121/84, pulse 97, temperature 97.9 F (36.6 C), resp. rate 18, height 5' 8 (1.727 m), weight 48.3 kg, SpO2 99%. Body mass index is 16.19 kg/m.  Diagnosis: Principal Problem:   Schizophrenia, paranoid (HCC)    Treatment Plan Summary:  Safety and Monitoring:             -- Involuntary admission to inpatient psychiatric unit for safety, stabilization and treatment             -- Daily contact with patient to assess and evaluate symptoms and progress in treatment             -- Patient's case to be discussed in multi-disciplinary team meeting             -- Observation Level: q15 minute checks             -- Vital signs:  q12 hours             -- Precautions: suicide, elopement, and assault   2. Psychiatric Diagnoses and Treatment:              Increase Depakote  to 500 mg twice daily Continue Zyprexa  10 mg nightly     -- The risks/benefits/side-effects/alternatives to this medication were discussed in detail with the patient and time was given for questions. The patient consents to medication trial.                -- Metabolic profile and EKG monitoring obtained while on an atypical antipsychotic (BMI: Lipid Panel: HbgA1c: QTc:)              -- Encouraged patient to participate in unit  milieu and in scheduled group therapies  4. Discharge Planning:   -- Social work and case management to assist with discharge planning and identification of hospital follow-up needs prior to discharge  -- Estimated LOS: 3-4 days  Millie JONELLE Manners, MD 10/20/2024, 12:43 PM

## 2024-10-20 NOTE — Group Note (Signed)
 Date:  10/20/2024 Time:  7:06 PM  Group Topic/Focus:  Coping With Mental Health Crisis:   The purpose of this group is to help patients identify strategies for coping with mental health crisis.  Group discusses possible causes of crisis and ways to manage them effectively. Healthy Communication:   The focus of this group is to discuss communication, barriers to communication, as well as healthy ways to communicate with others. Wellness Toolbox:   The focus of this group is to discuss various aspects of wellness, balancing those aspects and exploring ways to increase the ability to experience wellness.  Patients will create a wellness toolbox for use upon discharge.    Participation Level:  Did Not Attend  Participation Quality:    Affect:    Cognitive:    Insight:   Engagement in Group:    Modes of Intervention:    Additional Comments:    Deunta Beneke L Tyrek Lawhorn 10/20/2024, 7:06 PM

## 2024-10-20 NOTE — Group Note (Signed)
 Date:  10/20/2024 Time:  10:44 AM  Group Topic/Focus:  Getting fit with Trev Boley    Participation Level:  Did Not Attend    [name] 10/20/2024, 10:44 AM

## 2024-10-20 NOTE — Progress Notes (Signed)
 Patient is an involuntary admission to [facility] with APS seeking guardianship.  Hx of bipolar and was living in transitional housing when brought in. Denies SI, HI, AVH, anxiety and depression. Is isolative but comes out for meals. Takes her medication with no problems. Did take a shower today without prompting. Will continue to monitor.

## 2024-10-20 NOTE — Group Note (Signed)
 [facility] LCSW Group Therapy Note   Group Date: 10/20/2024 Start Time: 1100 End Time: 1135   Type of Therapy/Topic:  Group Therapy:  Emotion Regulation  Participation Level:  Did Not Attend   Mood:  Description of Group:    The purpose of this group is to assist patients in learning to regulate negative emotions and experience positive emotions. Patients will be guided to discuss ways in which they have been vulnerable to their negative emotions. These vulnerabilities will be juxtaposed with experiences of positive emotions or situations, and patients challenged to use positive emotions to combat negative ones. Special emphasis will be placed on coping with negative emotions in conflict situations, and patients will process healthy conflict resolution skills.  Therapeutic Goals: Patient will identify two positive emotions or experiences to reflect on in order to balance out negative emotions:  Patient will label two or more emotions that they find the most difficult to experience:  Patient will be able to demonstrate positive conflict resolution skills through discussion or role plays:   Summary of Patient Progress:  Patient did not attend group.     Therapeutic Modalities:   Cognitive Behavioral Therapy Feelings Identification Dialectical Behavioral Therapy   [name], LCSWA

## 2024-10-20 NOTE — Plan of Care (Signed)
  Problem: Activity: Goal: Sleeping patterns will improve Outcome: Progressing   Problem: Coping: Goal: Ability to verbalize frustrations and anger appropriately will improve Outcome: Progressing Goal: Ability to demonstrate self-control will improve Outcome: Progressing   

## 2024-10-20 NOTE — Progress Notes (Signed)
   10/19/24 2236  Psych Admission Type (Psych Patients Only)  Admission Status Involuntary  Psychosocial Assessment  Patient Complaints None  Eye Contact Fair  Facial Expression Flat  Affect Appropriate to circumstance  Speech Unremarkable  Interaction Guarded  Motor Activity Slow  Appearance/Hygiene In scrubs  Behavior Characteristics Cooperative;Calm  Mood Pleasant  Thought Process  Coherency Unable to assess  Content WDL  Delusions None reported or observed  Perception UTA  Hallucination None reported or observed  Judgment Impaired  Confusion Mild  Danger to Self  Current suicidal ideation? Denies  Description of Suicide Plan n/a  Agreement Not to Harm Self Yes  Description of Agreement verbal  Danger to Others  Danger to Others None reported or observed    Estimated Sleeping Duration (Last 24 Hours): 9.50-11.00 hours (Due to Daylight Saving Time, the durations displayed may not accurately represent documentation during the time change interval)

## 2024-10-21 DIAGNOSIS — F200 Paranoid schizophrenia: Secondary | ICD-10-CM | POA: Diagnosis not present

## 2024-10-21 NOTE — Progress Notes (Signed)
   10/21/24 0034  Psych Admission Type (Psych Patients Only)  Admission Status Involuntary  Psychosocial Assessment  Patient Complaints None  Eye Contact Fair  Facial Expression Flat  Affect Appropriate to circumstance  Speech Unremarkable  Interaction Forwards little  Motor Activity Slow  Appearance/Hygiene Unremarkable  Behavior Characteristics Cooperative  Mood Pleasant  Thought Process  Coherency Unable to assess  Content WDL  Delusions None reported or observed  Perception UTA  Hallucination None reported or observed  Judgment Impaired  Confusion UTA  Danger to Self  Current suicidal ideation? Denies  Description of Suicide Plan n/a  Agreement Not to Harm Self Yes  Description of Agreement verbal  Danger to Others  Danger to Others None reported or observed   Estimated Sleeping Duration (Last 24 Hours): 7.00-9.00 hours (Due to Daylight Saving Time, the durations displayed may not accurately represent documentation during the time change interval)

## 2024-10-21 NOTE — Plan of Care (Signed)
   Problem: Education: Goal: Verbalization of understanding the information provided will improve Outcome: Progressing   Problem: Activity: Goal: Sleeping patterns will improve Outcome: Progressing

## 2024-10-21 NOTE — Group Note (Signed)
 Recreation Therapy Group Note   Group Topic:Other  Group Date: 10/21/2024 Start Time: 1400 End Time: 1450 Facilitators: Celestia Jeoffrey BRAVO, LRT, CTRS Location: Dayroom  Activity Description/Intervention: Therapeutic Drumming. Patients with peers and staff were given the opportunity to engage in a leader facilitated [facility] Group Empowerment Drumming Circle with staff from the [employer], in partnership with [employer]. Teaching laboratory technician and trained [place], Norleen Mon leading with LRT observing and documenting intervention and pt response. This evidenced-based practice targets 7 areas of health and wellbeing in the human experience including: stress-reduction, exercise, self-expression, camaraderie/support, nurturing, spirituality, and music-making (leisure).    Goal Area(s) Addresses:  Patient will engage in pro-social way in music group.  Patient will follow directions of drum leader on the first prompt. Patient will demonstrate no behavioral issues during group.  Patient will identify if a reduction in stress level occurs as a result of participation in therapeutic drum circle.   Affect/Mood: N/A   Participation Level: Did not attend    Clinical Observations/Individualized Feedback: Patient did not attend group.   Plan: Continue to engage patient in RT group sessions 2-3x/week.   Jeoffrey BRAVO Celestia, LRT, CTRS 10/21/2024 5:18 PM

## 2024-10-21 NOTE — Group Note (Signed)
 Date:  10/21/2024 Time:  11:13 AM  Group Topic/Focus:  Morning strech with Nadina Fomby    Participation Level:  Active  Participation Quality:  Appropriate  Affect:  Appropriate  Cognitive:  Appropriate  Insight: Appropriate  Engagement in Group:  Engaged  Modes of Intervention:  Activity  Additional Comments:  none  [name] 10/21/2024, 11:13 AM

## 2024-10-21 NOTE — Group Note (Signed)
 Physical/Occupational Therapy Group Note  Group Topic: Yoga  Group Date: 10/21/2024 Start Time: 1300 End Time: 1325 Facilitators: Monesha Monreal, Alm Hamilton, PT   Group Description: Group participated with series of yoga poses, designed to emphasize functional sitting balance, core stability, generalized flexibility and overall posture.  Incorporated deep breathing techniques with poses, working to promote relaxation, mindfulness and focus with targeted activities.   Discussed benefits of yoga in improving mood and self-esteem, reducing stress and anxiety, and promoting functional strength and balance for each participant.  Discussed ways to integrate into each participant's daily routine.  Provided handout with written and pictorial descriptions of included yoga movements to be utilized as appropriate outside of group time.  Therapeutic Goal(s):  Demonstrate safe ability to participate with yoga poses during group activity. Identify one benefit of participation with yoga poses as part of each participant's exercise/movement routine. Identify 1-2 individual poses that participant feels most beneficial to his/her needs and that he/she can easily replicate outside of group.  Individual Participation: Pt was actively engaged to both the discussion and activity portions of the session.  Pt followed all commands well for proper technique of the various chair yoga positions with only minimal verbal and visual cuing.    Participation Level: Active and Engaged   Participation Quality: Minimal Cues   Behavior: Appropriate   Speech/Thought Process: Coherent   Affect/Mood: Appropriate   Insight: Good   Judgement: Good   Modes of Intervention: Activity, Discussion, and Education  Patient Response to Interventions:  Attentive, Engaged, Interested , and Receptive   Plan: Continue to engage patient in PT/OT groups 1 - 2x/week.  CHARM Hamilton Bertin PT, DPT 10/21/24, 1:47 PM

## 2024-10-21 NOTE — Progress Notes (Signed)
 [facility] MD Progress Note  10/21/2024 12:59 PM Shari [name]  MRN:  [id number] Patient is a 68 year old female with a history of Schizoaffective Disorder, Bipolar Type who presents via [facility] under IVC, initiated by CM, Falencio with re-entry program, to [facility] for assessment. Per IVC, [name] has been diagnosed with Schizophrenia and is refusing to take medication. [name] isn't sleeping. Residents report her screaming and yelling all night long.  [name] responding to internal stimuli.  [name] verbally aggressive.  The respondent has locked all of the residents inside of the transitional housing home.  They could not leave the home which prompted the IVC.  Residents did not have access to food, the restroom or anything as a result of being locked in.  [name] is currently under Adult protective services care. Patient is admitted to [facility] unit with Q15 min safety monitoring. Multidisciplinary team approach is offered. Medication management; group/milieu therapy is offered.   Subjective:  Chart reviewed, case discussed in multidisciplinary meeting, patient seen during rounds.    Patient is noted to be resting in bed.  She offers no complaints.  She denies feeling depressed or anxious.  She denies SI/HI/plan.  She denies auditory/visual hallucinations.  She continues to remain delusional and playing the transition houses her housing and she lives by herself.  Discussed with social work team to reach out to the caseworker of APS to look into her community living situation given her delusions. Past Psychiatric History: see h&P Family History:  Family History  Problem Relation Age of Onset   Diabetes Mother    CAD Mother    Lung cancer Father    Social History:  Social History   Substance and Sexual Activity  Alcohol Use Yes   Comment: beer occasionally     Social History   Substance and Sexual Activity  Drug Use No    Social History   Socioeconomic History    Marital status: Widowed    Spouse name: Not on file   Number of children: Not on file   Years of education: Not on file   Highest education level: Not on file  Occupational History   Not on file  Tobacco Use   Smoking status: Every Day    Current packs/day: 0.50    Average packs/day: 0.5 packs/day for 40.0 years (20.0 ttl pk-yrs)    Types: Cigarettes   Smokeless tobacco: Never  Vaping Use   Vaping status: Never Used  Substance and Sexual Activity   Alcohol use: Yes    Comment: beer occasionally   Drug use: No   Sexual activity: Not Currently    Birth control/protection: Post-menopausal  Other Topics Concern   Not on file  Social History Narrative   Not on file   Social Drivers of Health   Financial Resource Strain: Not on file  Food Insecurity: No Food Insecurity (10/17/2024)   Hunger Vital Sign    Worried About Running Out of Food in the Last Year: Never true    Ran Out of Food in the Last Year: Never true  Transportation Needs: No Transportation Needs (10/17/2024)   PRAPARE - Administrator, Civil Service (Medical): No    Lack of Transportation (Non-Medical): No  Recent Concern: Transportation Needs - Unmet Transportation Needs (10/14/2024)   PRAPARE - Administrator, Civil Service (Medical): Yes    Lack of Transportation (Non-Medical): Yes  Physical Activity: Not on file  Stress: Not on  file  Social Connections: Unknown (10/17/2024)   Social Connection and Isolation Panel    Frequency of Communication with Friends and Family: Patient unable to answer    Frequency of Social Gatherings with Friends and Family: Patient unable to answer    Attends Religious Services: Patient unable to answer    Active Member of Clubs or Organizations: Patient declined    Attends Banker Meetings: Patient unable to answer    Marital Status: Widowed   Past Medical History:  Past Medical History:  Diagnosis Date   Eye globe prosthesis    GERD  (gastroesophageal reflux disease)    History of blood transfusion 1973   related to abscess burst in my stomach   Hyperlipemia    Hyperlipidemia    Hypertension    Osteoarthritis of back    Lowerback    SVD (spontaneous vaginal delivery)    x 1, baby died at 2 wks of age   Type II diabetes mellitus (HCC)     Past Surgical History:  Procedure Laterality Date   APPENDECTOMY     COLONOSCOPY     DILATATION & CURETTAGE/HYSTEROSCOPY WITH MYOSURE N/A 02/25/2015   Procedure: DILATATION & CURETTAGE/HYSTEROSCOPY WITH MYOSURE;  Surgeon: Truman Corona, MD;  Location: [facility];  Service: Gynecology;  Laterality: N/A;   DILATION AND CURETTAGE OF UTERUS     ENUCLEATION Right 03/16/2017   ENUCLEATION Right 03/16/2017   Procedure: ENUCLEATION RIGHT EYE;  Surgeon: Loyd Kathryne Palm, MD;  Location: [facility] OR;  Service: Ophthalmology;  Laterality: Right;   EYE SURGERY     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6, 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   EYE SURGERY Right ~ 1974   S/P initial eye injury; scissors stuck in my eye   LAPAROSCOPIC CHOLECYSTECTOMY     SHOULDER ARTHROSCOPY WITH ROTATOR CUFF REPAIR Left    wire stitches per patient   SHOULDER ARTHROSCOPY WITH ROTATOR CUFF REPAIR Right     Current Medications: Current Facility-Administered Medications  Medication Dose Route Frequency Provider Last Rate Last Admin   acetaminophen  (TYLENOL ) tablet 650 mg  650 mg Oral Q6H PRN Tex Drilling, NP   650 mg at 10/21/24 0939   alum & mag hydroxide-simeth (MAALOX/MYLANTA) 200-200-20 MG/5ML suspension 30 mL  30 mL Oral Q4H PRN Tex Drilling, NP       cloNIDine (CATAPRES) tablet 0.1 mg  0.1 mg Oral BID PRN Tex Drilling, NP       divalproex  (DEPAKOTE ) DR tablet 500 mg  500 mg Oral Q12H Prim Morace, MD   500 mg at 10/21/24 9063   losartan  (COZAAR ) tablet 25 mg  25 mg Oral Daily Nkwenti, Doris, NP   25 mg at 10/21/24 0935   magnesium  hydroxide (MILK OF MAGNESIA) suspension 30 mL  30 mL Oral Daily PRN Tex Drilling, NP        methimazole  (TAPAZOLE ) tablet 10 mg  10 mg Oral Daily Nkwenti, Doris, NP   10 mg at 10/21/24 9063   OLANZapine  (ZYPREXA ) injection 5 mg  5 mg Intramuscular TID PRN Tex Drilling, NP       OLANZapine  (ZYPREXA ) injection 5 mg  5 mg Intramuscular TID PRN Tex Drilling, NP       OLANZapine  (ZYPREXA ) tablet 10 mg  10 mg Oral QHS Joshia Kitchings, MD   10 mg at 10/20/24 2130   OLANZapine  zydis (ZYPREXA ) disintegrating tablet 5 mg  5 mg Oral TID PRN Tex Drilling, NP       OLANZapine  zydis (ZYPREXA ) disintegrating tablet  5 mg  5 mg Oral TID PRN Tex Drilling, NP       pantoprazole  (PROTONIX ) EC tablet 40 mg  40 mg Oral Daily Nkwenti, Doris, NP   40 mg at 10/21/24 9063   traZODone  (DESYREL ) tablet 50 mg  50 mg Oral QHS PRN Tex Drilling, NP        Lab Results:  No results found for this or any previous visit (from the past 48 hours).   Blood Alcohol level:  Lab Results  Component Value Date   [facility] <15 10/14/2024   ETH <10 07/02/2022    Metabolic Disorder Labs: Lab Results  Component Value Date   HGBA1C 5.5 10/14/2024   MPG 111.15 10/14/2024   MPG 137 01/06/2022   Lab Results  Component Value Date   PROLACTIN 5.8 10/14/2024   Lab Results  Component Value Date   CHOL 167 10/14/2024   TRIG 68 10/14/2024   HDL 60 10/14/2024   CHOLHDL 2.8 10/14/2024   VLDL 14 10/14/2024   LDLCALC 93 10/14/2024   LDLCALC 73 03/13/2024    Physical Findings: AIMS:  , ,  ,  ,    CIWA:    COWS:      Psychiatric Specialty Exam:  Presentation  General Appearance:  Appropriate for Environment  Eye Contact: Fleeting  Speech: Normal Rate  Speech Volume: Decreased    Mood and Affect  Mood: Anxious; Irritable  Affect: Flat   Thought Process  Thought Processes: Disorganized  Orientation:Partial  Thought Content:Illogical; Paranoid Ideation  Hallucinations: Denies  Ideas of Reference:Delusions; Paranoia  Suicidal Thoughts: Denies  Homicidal Thoughts:  Denies   Sensorium  Memory: Immediate Fair; Recent Fair  Judgment: Impaired  Insight: Shallow   Executive Functions  Concentration: Fair  Attention Span: Fair  Recall: Poor  Fund of Knowledge: Fair  Language: Fair   Psychomotor Activity  Psychomotor Activity: No data recorded  Musculoskeletal: Strength & Muscle Tone: within normal limits Gait & Station: normal Assets  Assets: Manufacturing Systems Engineer; Desire for Improvement; Social Support    Physical Exam: Physical Exam ROS Blood pressure 133/65, pulse 79, temperature 97.7 F (36.5 C), resp. rate 18, height 5' 8 (1.727 m), weight 48.3 kg, SpO2 100%. Body mass index is 16.19 kg/m.  Diagnosis: Principal Problem:   Schizophrenia, paranoid (HCC)    Treatment Plan Summary:  Safety and Monitoring:             -- Involuntary admission to inpatient psychiatric unit for safety, stabilization and treatment             -- Daily contact with patient to assess and evaluate symptoms and progress in treatment             -- Patient's case to be discussed in multi-disciplinary team meeting             -- Observation Level: q15 minute checks             -- Vital signs:  q12 hours             -- Precautions: suicide, elopement, and assault   2. Psychiatric Diagnoses and Treatment:             Depakote   500 mg twice daily Continue Zyprexa  10 mg nightly     -- The risks/benefits/side-effects/alternatives to this medication were discussed in detail with the patient and time was given for questions. The patient consents to medication trial.                --  Metabolic profile and EKG monitoring obtained while on an atypical antipsychotic (BMI: Lipid Panel: HbgA1c: QTc:)              -- Encouraged patient to participate in unit milieu and in scheduled group therapies  4. Discharge Planning:   -- Social work and case management to assist with discharge planning and identification of hospital follow-up needs prior to  discharge  -- Estimated LOS: 3-4 days  Allyn Foil, MD 10/21/2024, 12:59 PM

## 2024-10-21 NOTE — Progress Notes (Signed)
   10/21/24 2200  Psych Admission Type (Psych Patients Only)  Admission Status Involuntary  Psychosocial Assessment  Patient Complaints None  Eye Contact Fair  Facial Expression Flat  Affect Appropriate to circumstance  Speech Soft  Interaction Assertive  Motor Activity Slow  Appearance/Hygiene In scrubs  Behavior Characteristics Cooperative  Mood Pleasant  Thought Process  Coherency Disorganized  Content Delusions  Delusions Paranoid  Perception WDL  Hallucination None reported or observed  Judgment Limited  Confusion None  Danger to Self  Current suicidal ideation? Denies  Agreement Not to Harm Self Yes  Description of Agreement Verbal  Danger to Others  Danger to Others None reported or observed

## 2024-10-22 DIAGNOSIS — F200 Paranoid schizophrenia: Secondary | ICD-10-CM | POA: Diagnosis not present

## 2024-10-22 NOTE — Group Note (Signed)
 Date:  10/22/2024 Time:  11:07 AM  Group Topic/Focus:   Exercise provides significant physical and mental health benefits for seniors, including improved bone and muscle strength, a lower risk of falls, and better management of chronic conditions like heart disease and diabetes. It also boosts cognitive function, enhances mood, and can lead to a longer, more independent life.  Participation Level:  Did Not Attend   Shari [name] 10/22/2024, 11:07 AM

## 2024-10-22 NOTE — Group Note (Signed)
 LCSW Group Therapy Note  Group Date: 10/22/2024 Start Time: 1300 End Time: 1400   Type of Therapy and Topic:  Group Therapy: Positive Affirmations  Participation Level:  Active   Description of Group:   This group addressed positive affirmation towards self and others.  Patients went around the room and identified two positive things about themselves and two positive things about a peer in the room.  Patients reflected on how it felt to share something positive with others, to identify positive things about themselves, and to hear positive things from others/ Patients were encouraged to have a daily reflection of positive characteristics or circumstances.   Therapeutic Goals: Patients will verbalize two of their positive qualities Patients will demonstrate empathy for others by stating two positive qualities about a peer in the group Patients will verbalize their feelings when voicing positive self affirmations and when voicing positive affirmations of others Patients will discuss the potential positive impact on their wellness/recovery of focusing on positive traits of self and others.  Summary of Patient Progress:  The patient engaged in a life-review exercise, discussing the various factors that have contributed to their health and well-being. They completed a puzzle activity in which each piece was drawn and colored to represent a personal protective factor. The patient identified their support systems, goals, plans, hobbies, and leisure activities. They clearly communicated their future aspirations and participated in conversations with peers about personal goals and challenges.  Therapeutic Modalities:   Cognitive Behavioral Therapy Motivational Interviewing    Alveta CHRISTELLA Kerns, LCSWA 10/22/2024  2:04 PM

## 2024-10-22 NOTE — Plan of Care (Signed)
  Problem: Activity: Goal: Sleeping patterns will improve Outcome: Progressing   

## 2024-10-22 NOTE — Group Note (Signed)
 Recreation Therapy Group Note   Group Topic:Coping Skills  Group Date: 10/22/2024 Start Time: 1400 End Time: 1455 Facilitators: Celestia Jeoffrey BRAVO, LRT, CTRS Location: Dayroom   Group Description: Mind Map.  Patient was provided a blank template of a diagram with 32 blank boxes in a tiered system, branching from the center (similar to a bubble chart). LRT directed patients to label the middle of the diagram Coping Skills. LRT and patients then came up with 8 different coping skills as examples. Pt were directed to record their coping skills in the 2nd tier boxes closest to the center.  Patients would then share their coping skills with the group as LRT wrote them out. LRT gave a handout of 99 different coping skills at the end of group. LRT and patients went outside to the courtyard for fresh air and sunlight with time remaining.   Goal Area(s) Addressed: Patients will be able to define "coping skills". Patient will identify new coping skills.  Patient will increase communication.   Affect/Mood: Appropriate   Participation Level: Minimal    Clinical Observations/Individualized Feedback: Issa was present in group. Pt shared that thinking skills are her coping skills. Pt did not complete a handout or interact with LRT or peers.   Plan: Continue to engage patient in RT group sessions 2-3x/week.   Jeoffrey BRAVO Celestia, LRT, CTRS 10/22/2024 4:47 PM

## 2024-10-22 NOTE — Progress Notes (Signed)
   10/22/24 1940  Psych Admission Type (Psych Patients Only)  Admission Status Involuntary  Psychosocial Assessment  Patient Complaints None  Eye Contact Fair  Facial Expression Flat  Affect Appropriate to circumstance  Speech Soft  Interaction Assertive  Motor Activity Slow  Appearance/Hygiene In scrubs  Behavior Characteristics Calm;Cooperative  Mood Pleasant  Thought Process  Coherency Disorganized  Content WDL  Delusions None reported or observed  Perception WDL  Hallucination None reported or observed  Judgment Limited  Confusion None  Danger to Self  Current suicidal ideation? Denies  Agreement Not to Harm Self Yes  Description of Agreement Verbal  Danger to Others  Danger to Others None reported or observed

## 2024-10-22 NOTE — Plan of Care (Signed)
  Problem: Activity: Goal: Interest or engagement in activities will improve Outcome: Progressing   Problem: Physical Regulation: Goal: Ability to maintain clinical measurements within normal limits will improve Outcome: Progressing   Problem: Safety: Goal: Periods of time without injury will increase Outcome: Progressing

## 2024-10-22 NOTE — Progress Notes (Signed)
   10/22/24 0617  15 Minute Checks  Location Bedroom  Visual Appearance Calm  Behavior Sleeping  Sleep (Behavioral Health Patients Only)  Calculate sleep? (Click Yes once per 24 hr at 0600 safety check) Yes  Documented sleep last 24 hours 9.25

## 2024-10-22 NOTE — Progress Notes (Signed)
 [facility] MD Progress Note  10/22/2024 11:00 AM TAESHA GOODELL  MRN:  [id number] Patient is a 68 year old female with a history of Schizoaffective Disorder, Bipolar Type who presents via [employer] under IVC, initiated by CM, Falencio with re-entry program, to [facility] for assessment. Per IVC, [name] has been diagnosed with Schizophrenia and is refusing to take medication. [name] isn't sleeping. Residents report her screaming and yelling all night long.  [name] responding to internal stimuli.  [name] verbally aggressive.  The respondent has locked all of the residents inside of the transitional housing home.  They could not leave the home which prompted the IVC.  Residents did not have access to food, the restroom or anything as a result of being locked in.  [name] is currently under Adult protective services care. Patient is admitted to [facility] unit with Q15 min safety monitoring. Multidisciplinary team approach is offered. Medication management; group/milieu therapy is offered.   Subjective:  Chart reviewed, case discussed in multidisciplinary meeting, patient seen during rounds.   Patient is noted to be sitting in her bed.  She offers no complaints.  When asked about her family or friends she reports Mr. Debby from [employer] department can be reached out.  When asked if she has a caseworker she did acknowledge case worker but reports that caseworker is through [facility] department.  She continues to endorse the transitional housing being her home and the need for her to return back to her home.  She denies SI/HI/plan.  Even though she denies auditory/visual hallucinations patient clearly remains delusional and paranoid with lack of insight into her mental health problems.  Per nursing patient is participating in ADLs and is taking her medications with a lot of encouragement  Past Psychiatric History: see h&P Family History:  Family History  Problem Relation Age of Onset    Diabetes Mother    CAD Mother    Lung cancer Father    Social History:  Social History   Substance and Sexual Activity  Alcohol Use Yes   Comment: beer occasionally     Social History   Substance and Sexual Activity  Drug Use No    Social History   Socioeconomic History   Marital status: Widowed    Spouse name: Not on file   Number of children: Not on file   Years of education: Not on file   Highest education level: Not on file  Occupational History   Not on file  Tobacco Use   Smoking status: Every Day    Current packs/day: 0.50    Average packs/day: 0.5 packs/day for 40.0 years (20.0 ttl pk-yrs)    Types: Cigarettes   Smokeless tobacco: Never  Vaping Use   Vaping status: Never Used  Substance and Sexual Activity   Alcohol use: Yes    Comment: beer occasionally   Drug use: No   Sexual activity: Not Currently    Birth control/protection: Post-menopausal  Other Topics Concern   Not on file  Social History Narrative   Not on file   Social Drivers of Health   Financial Resource Strain: Not on file  Food Insecurity: No Food Insecurity (10/17/2024)   Hunger Vital Sign    Worried About Running Out of Food in the Last Year: Never true    Ran Out of Food in the Last Year: Never true  Transportation Needs: No Transportation Needs (10/17/2024)   PRAPARE - Administrator, Civil Service (Medical):  No    Lack of Transportation (Non-Medical): No  Recent Concern: Transportation Needs - Unmet Transportation Needs (10/14/2024)   PRAPARE - Administrator, Civil Service (Medical): Yes    Lack of Transportation (Non-Medical): Yes  Physical Activity: Not on file  Stress: Not on file  Social Connections: Unknown (10/17/2024)   Social Connection and Isolation Panel    Frequency of Communication with Friends and Family: Patient unable to answer    Frequency of Social Gatherings with Friends and Family: Patient unable to answer    Attends Religious  Services: Patient unable to answer    Active Member of Clubs or Organizations: Patient declined    Attends Banker Meetings: Patient unable to answer    Marital Status: Widowed   Past Medical History:  Past Medical History:  Diagnosis Date   Eye globe prosthesis    GERD (gastroesophageal reflux disease)    History of blood transfusion 1973   related to abscess burst in my stomach   Hyperlipemia    Hyperlipidemia    Hypertension    Osteoarthritis of back    Lowerback    SVD (spontaneous vaginal delivery)    x 1, baby died at 2 wks of age   Type II diabetes mellitus (HCC)     Past Surgical History:  Procedure Laterality Date   APPENDECTOMY     COLONOSCOPY     DILATATION & CURETTAGE/HYSTEROSCOPY WITH MYOSURE N/A 02/25/2015   Procedure: DILATATION & CURETTAGE/HYSTEROSCOPY WITH MYOSURE;  Surgeon: Truman Corona, MD;  Location: [facility];  Service: Gynecology;  Laterality: N/A;   DILATION AND CURETTAGE OF UTERUS     ENUCLEATION Right 03/16/2017   ENUCLEATION Right 03/16/2017   Procedure: ENUCLEATION RIGHT EYE;  Surgeon: Loyd Kathryne Palm, MD;  Location: [facility] OR;  Service: Ophthalmology;  Laterality: Right;   EYE SURGERY     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6, 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   EYE SURGERY Right ~ 1974   S/P initial eye injury; scissors stuck in my eye   LAPAROSCOPIC CHOLECYSTECTOMY     SHOULDER ARTHROSCOPY WITH ROTATOR CUFF REPAIR Left    wire stitches per patient   SHOULDER ARTHROSCOPY WITH ROTATOR CUFF REPAIR Right     Current Medications: Current Facility-Administered Medications  Medication Dose Route Frequency Provider Last Rate Last Admin   acetaminophen  (TYLENOL ) tablet 650 mg  650 mg Oral Q6H PRN Tex Drilling, NP   650 mg at 10/21/24 0939   alum & mag hydroxide-simeth (MAALOX/MYLANTA) 200-200-20 MG/5ML suspension 30 mL  30 mL Oral Q4H PRN Tex Drilling, NP       cloNIDine (CATAPRES) tablet 0.1 mg  0.1 mg Oral BID PRN Tex Drilling, NP       divalproex  (DEPAKOTE )  DR tablet 500 mg  500 mg Oral Q12H Shiann Kam, MD   500 mg at 10/22/24 9093   losartan  (COZAAR ) tablet 25 mg  25 mg Oral Daily Nkwenti, Doris, NP   25 mg at 10/22/24 9093   magnesium  hydroxide (MILK OF MAGNESIA) suspension 30 mL  30 mL Oral Daily PRN Tex Drilling, NP       methimazole  (TAPAZOLE ) tablet 10 mg  10 mg Oral Daily Nkwenti, Doris, NP   10 mg at 10/22/24 9093   OLANZapine  (ZYPREXA ) injection 5 mg  5 mg Intramuscular TID PRN Tex Drilling, NP       OLANZapine  (ZYPREXA ) injection 5 mg  5 mg Intramuscular TID PRN Tex Drilling, NP  OLANZapine  (ZYPREXA ) tablet 10 mg  10 mg Oral QHS Mariena Meares, MD   10 mg at 10/21/24 2116   OLANZapine  zydis (ZYPREXA ) disintegrating tablet 5 mg  5 mg Oral TID PRN Tex Drilling, NP       OLANZapine  zydis (ZYPREXA ) disintegrating tablet 5 mg  5 mg Oral TID PRN Tex Drilling, NP       pantoprazole  (PROTONIX ) EC tablet 40 mg  40 mg Oral Daily Nkwenti, Doris, NP   40 mg at 10/22/24 9093   traZODone  (DESYREL ) tablet 50 mg  50 mg Oral QHS PRN Tex Drilling, NP        Lab Results:  No results found for this or any previous visit (from the past 48 hours).   Blood Alcohol level:  Lab Results  Component Value Date   [facility] <15 10/14/2024   ETH <10 07/02/2022    Metabolic Disorder Labs: Lab Results  Component Value Date   HGBA1C 5.5 10/14/2024   MPG 111.15 10/14/2024   MPG 137 01/06/2022   Lab Results  Component Value Date   PROLACTIN 5.8 10/14/2024   Lab Results  Component Value Date   CHOL 167 10/14/2024   TRIG 68 10/14/2024   HDL 60 10/14/2024   CHOLHDL 2.8 10/14/2024   VLDL 14 10/14/2024   LDLCALC 93 10/14/2024   LDLCALC 73 03/13/2024    Physical Findings: AIMS:  , ,  ,  ,    CIWA:    COWS:      Psychiatric Specialty Exam:  Presentation  General Appearance:  Appropriate for Environment  Eye Contact: Fleeting  Speech: Normal Rate  Speech Volume: Decreased    Mood and Affect   Mood:fine  Affect: Flat   Thought Process  Thought Processes: Disorganized  Orientation:Partial  Thought Content:Illogical; Paranoid Ideation  Hallucinations: Denies  Ideas of Reference:Delusions; Paranoia  Suicidal Thoughts: Denies  Homicidal Thoughts: Denies   Sensorium  Memory: Immediate Fair; Recent Fair  Judgment: Impaired  Insight: Shallow   Executive Functions  Concentration: Fair  Attention Span: Fair  Recall: Poor  Fund of Knowledge: Fair  Language: Fair   Psychomotor Activity  Psychomotor Activity: No data recorded  Musculoskeletal: Strength & Muscle Tone: within normal limits Gait & Station: normal Assets  Assets: Manufacturing Systems Engineer; Desire for Improvement; Social Support    Physical Exam: Physical Exam Vitals and nursing note reviewed.    ROS Blood pressure 134/67, pulse 88, temperature (!) 97.3 F (36.3 C), resp. rate 18, height 5' 8 (1.727 m), weight 48.3 kg, SpO2 100%. Body mass index is 16.19 kg/m.  Diagnosis: Principal Problem:   Schizophrenia, paranoid (HCC)    Treatment Plan Summary:  Safety and Monitoring:             -- Involuntary admission to inpatient psychiatric unit for safety, stabilization and treatment             -- Daily contact with patient to assess and evaluate symptoms and progress in treatment             -- Patient's case to be discussed in multi-disciplinary team meeting             -- Observation Level: q15 minute checks             -- Vital signs:  q12 hours             -- Precautions: suicide, elopement, and assault   2. Psychiatric Diagnoses and Treatment:  Depakote   500 mg twice daily Continue Zyprexa  10 mg nightly     -- The risks/benefits/side-effects/alternatives to this medication were discussed in detail with the patient and time was given for questions. The patient consents to medication trial.                -- Metabolic profile and EKG monitoring obtained  while on an atypical antipsychotic (BMI: Lipid Panel: HbgA1c: QTc:)              -- Encouraged patient to participate in unit milieu and in scheduled group therapies  4. Discharge Planning:   -- Social work and case management to assist with discharge planning and identification of hospital follow-up needs prior to discharge  -- Estimated LOS: 3-4 days  Charlaine Utsey, MD 10/22/2024, 11:00 AM

## 2024-10-22 NOTE — Group Note (Signed)
 Date:  10/22/2024 Time:  10:40 PM  Group Topic/Focus:  Wrap-Up Group:   The focus of this group is to help patients review their daily goal of treatment and discuss progress on daily workbooks.    Participation Level:  Active  Participation Quality:  Appropriate  Affect:  Appropriate  Cognitive:  Alert  Insight: Appropriate  Engagement in Group:  Engaged  Modes of Intervention:  Discussion  Additional Comments:    Shari [name] [name] 10/22/2024, 10:40 PM

## 2024-10-22 NOTE — Plan of Care (Signed)
   Problem: Education: Goal: Emotional status will improve Outcome: Progressing Goal: Mental status will improve Outcome: Progressing   Problem: Activity: Goal: Interest or engagement in activities will improve Outcome: Progressing

## 2024-10-22 NOTE — Progress Notes (Signed)
   10/22/24 0900  Psych Admission Type (Psych Patients Only)  Admission Status Involuntary  Psychosocial Assessment  Patient Complaints None  Eye Contact Fair  Facial Expression Flat  Affect Appropriate to circumstance  Speech Soft  Interaction Assertive  Motor Activity Slow  Appearance/Hygiene In scrubs  Behavior Characteristics Cooperative  Mood Pleasant  Thought Process  Coherency Disorganized  Content Delusions  Delusions None reported or observed  Perception WDL  Hallucination None reported or observed  Judgment Limited  Confusion None  Danger to Self  Current suicidal ideation? Denies  Agreement Not to Harm Self Yes  Description of Agreement verbal  Danger to Others  Danger to Others None reported or observed

## 2024-10-23 DIAGNOSIS — F200 Paranoid schizophrenia: Secondary | ICD-10-CM | POA: Diagnosis not present

## 2024-10-23 MED ORDER — OLANZAPINE 5 MG PO TABS
15.0000 mg | ORAL_TABLET | Freq: Every day | ORAL | Status: DC
  Administered 2024-10-23 – 2024-10-27 (×5): 15 mg via ORAL
  Filled 2024-10-23 (×5): qty 3

## 2024-10-23 NOTE — BH IP Treatment Plan (Unsigned)
 Interdisciplinary Treatment and Diagnostic Plan Update  10/23/2024 Time of Session: 2:45 PM  Shari [name] MRN: [id number]  Principal Diagnosis: Schizophrenia, paranoid (HCC)  Secondary Diagnoses: Principal Problem:   Schizophrenia, paranoid (HCC)   Current Medications:  Current Facility-Administered Medications  Medication Dose Route Frequency Provider Last Rate Last Admin   acetaminophen  (TYLENOL ) tablet 650 mg  650 mg Oral Q6H PRN Tex Drilling, NP   650 mg at 10/22/24 1948   alum & mag hydroxide-simeth (MAALOX/MYLANTA) 200-200-20 MG/5ML suspension 30 mL  30 mL Oral Q4H PRN Tex Drilling, NP       cloNIDine (CATAPRES) tablet 0.1 mg  0.1 mg Oral BID PRN Tex Drilling, NP       divalproex  (DEPAKOTE ) DR tablet 500 mg  500 mg Oral Q12H Jadapalle, Sree, MD   500 mg at 10/23/24 9077   losartan  (COZAAR ) tablet 25 mg  25 mg Oral Daily Nkwenti, Doris, NP   25 mg at 10/23/24 9077   magnesium  hydroxide (MILK OF MAGNESIA) suspension 30 mL  30 mL Oral Daily PRN Tex Drilling, NP       methimazole  (TAPAZOLE ) tablet 10 mg  10 mg Oral Daily Nkwenti, Doris, NP   10 mg at 10/23/24 9077   OLANZapine  (ZYPREXA ) injection 5 mg  5 mg Intramuscular TID PRN Tex Drilling, NP       OLANZapine  (ZYPREXA ) injection 5 mg  5 mg Intramuscular TID PRN Tex Drilling, NP       OLANZapine  (ZYPREXA ) tablet 15 mg  15 mg Oral QHS Jadapalle, Sree, MD       OLANZapine  zydis (ZYPREXA ) disintegrating tablet 5 mg  5 mg Oral TID PRN Tex Drilling, NP       OLANZapine  zydis (ZYPREXA ) disintegrating tablet 5 mg  5 mg Oral TID PRN Tex Drilling, NP       pantoprazole  (PROTONIX ) EC tablet 40 mg  40 mg Oral Daily Nkwenti, Doris, NP   40 mg at 10/23/24 9077   traZODone  (DESYREL ) tablet 50 mg  50 mg Oral QHS PRN Tex Drilling, NP       PTA Medications: Medications Prior to Admission  Medication Sig Dispense Refill Last Dose/Taking   Ascorbic Acid (VITAMIN C PO) Take 1 tablet by mouth daily. (Patient not taking: Reported on  10/14/2024)      divalproex  (DEPAKOTE ) 500 MG DR tablet Take 1 tablet (500 mg total) by mouth at bedtime. (Patient not taking: Reported on 10/14/2024) 30 tablet 1    losartan  (COZAAR ) 25 MG tablet Take 1 tablet (25 mg total) by mouth daily. (Patient not taking: Reported on 10/14/2024) 90 tablet 1    methimazole  (TAPAZOLE ) 10 MG tablet Take 1 tablet (10 mg total) by mouth daily. (Patient not taking: Reported on 10/14/2024) 90 tablet 1    OLANZapine  (ZYPREXA ) 10 MG tablet Take 1 tablet (10 mg total) by mouth at bedtime. (Patient not taking: Reported on 10/14/2024) 30 tablet 1    pantoprazole  (PROTONIX ) 40 MG tablet Take 1 tablet (40 mg total) by mouth daily. (Patient not taking: Reported on 10/14/2024) 90 tablet 0    propranolol  (INDERAL ) 10 MG tablet Take 1 tablet (10 mg total) by mouth 2 (two) times daily. (Patient not taking: Reported on 10/14/2024) 180 tablet 1    VITAMIN D PO Take 1 tablet by mouth daily. (Patient not taking: Reported on 10/14/2024)      VITAMIN E PO Take 1 tablet by mouth daily. (Patient not taking: Reported on 10/14/2024)  Patient Stressors: Medication change or noncompliance    Patient Strengths: Communication skills   Treatment Modalities: Medication Management, Group therapy, Case management,  1 to 1 session with clinician, Psychoeducation, Recreational therapy.   Physician Treatment Plan for Primary Diagnosis: Schizophrenia, paranoid (HCC) Long Term Goal(s): Improvement in symptoms so as ready for discharge   Short Term Goals: Ability to identify changes in lifestyle to reduce recurrence of condition will improve Ability to verbalize feelings will improve Ability to disclose and discuss suicidal ideas Ability to demonstrate self-control will improve Ability to identify and develop effective coping behaviors will improve Ability to maintain clinical measurements within normal limits will improve  Medication Management: Evaluate patient's response, side  effects, and tolerance of medication regimen.  Therapeutic Interventions: 1 to 1 sessions, Unit Group sessions and Medication administration.  Evaluation of Outcomes: {BHH Tx Plan Outcomes:30414004}  Physician Treatment Plan for Secondary Diagnosis: Principal Problem:   Schizophrenia, paranoid (HCC)  Long Term Goal(s): Improvement in symptoms so as ready for discharge   Short Term Goals: Ability to identify changes in lifestyle to reduce recurrence of condition will improve Ability to verbalize feelings will improve Ability to disclose and discuss suicidal ideas Ability to demonstrate self-control will improve Ability to identify and develop effective coping behaviors will improve Ability to maintain clinical measurements within normal limits will improve     Medication Management: Evaluate patient's response, side effects, and tolerance of medication regimen.  Therapeutic Interventions: 1 to 1 sessions, Unit Group sessions and Medication administration.  Evaluation of Outcomes: {B[facility] Tx Plan Outcomes:30414004}   RN Treatment Plan for Primary Diagnosis: Schizophrenia, paranoid (HCC) Long Term Goal(s): {BHH RN Tx Plan Long Term Goals:30414009::Knowledge of disease and therapeutic regimen to maintain health will improve}  Short Term Goals: {BHH RN Tx Plan Short Term Hnjod:69585994}  Medication Management: RN will administer medications as ordered by provider, will assess and evaluate patient's response and provide education to patient for prescribed medication. RN will report any adverse and/or side effects to prescribing provider.  Therapeutic Interventions: 1 on 1 counseling sessions, Psychoeducation, Medication administration, Evaluate responses to treatment, Monitor vital signs and CBGs as ordered, Perform/monitor CIWA, COWS, AIMS and Fall Risk screenings as ordered, Perform wound care treatments as ordered.  Evaluation of Outcomes: {BHH Tx Plan Outcomes:30414004}   LCSW  Treatment Plan for Primary Diagnosis: Schizophrenia, paranoid (HCC) Long Term Goal(s): Safe transition to appropriate next level of care at discharge, Engage patient in therapeutic group addressing interpersonal concerns.  Short Term Goals: {BHH LCSW [place] PLAN SHORT TERM GOALS:30414010::Engage patient in aftercare planning with referrals and resources}  Therapeutic Interventions: Assess for all discharge needs, 1 to 1 time with Social worker, Explore available resources and support systems, Assess for adequacy in community support network, Educate family and significant other(s) on suicide prevention, Complete Psychosocial Assessment, Interpersonal group therapy.  Evaluation of Outcomes: {BHH Tx Plan Outcomes:30414004}   Progress in Treatment: Attending groups: {B[facility] ADULT:22608} Participating in groups: {B[facility] ADULT:22608} Taking medication as prescribed: {B[facility] ADULT:22608} Toleration medication: {B[facility] ADULT:22608} Family/Significant other contact made: {YES/NO/CONTACT:22665} Patient understands diagnosis: {BHH JILOU:77391} Discussing patient identified problems/goals with staff: {BHH JILOU:77391} Medical problems stabilized or resolved: {BHH ADULT:22608} Denies suicidal/homicidal ideation: {B[facility] ADULT:22608} Issues/concerns per patient self-inventory: {B[facility] ADULT:22608} Other: ***  New problem(s) identified: {BHH NEW PROBLEMS:22609}  New Short Term/Long Term Goal(s):  Patient Goals:    Discharge Plan or Barriers:   Reason for Continuation of Hospitalization: {BHH Reasons for continued hospitalization:22604}  Estimated Length of Stay:  Last 3 [place] Suicide  Severity Risk Score: Flowsheet Row Admission (Current) from 10/17/2024 in [facility] ED from [facility]/14/2024 in [facility] ED to Hosp-Admission (Discharged) from 03/13/2024 in [facility]  C-SSRS RISK CATEGORY No Risk No Risk No Risk    Last PHQ 2/9 Scores:      No data to display          Scribe for Treatment Team: [name], [place] 10/23/2024 3:20 PM

## 2024-10-23 NOTE — Group Note (Signed)
 Date:  10/23/2024 Time:  10:24 AM  Group Topic/Focus:  Meditation therapy    Participation Level:  Did Not Attend    [name] 10/23/2024, 10:24 AM

## 2024-10-23 NOTE — Plan of Care (Signed)
  Problem: Education: Goal: Knowledge of [facility] Education information/materials will improve 10/23/2024 1818 by Delores Garen GRADE, RN Outcome: Progressing 10/23/2024 1818 by Delores Garen GRADE, RN Outcome: Progressing Goal: Verbalization of understanding the information provided will improve 10/23/2024 1818 by Delores Garen GRADE, RN Outcome: Progressing 10/23/2024 1818 by Delores Garen GRADE, RN Outcome: Progressing

## 2024-10-23 NOTE — BHH Counselor (Addendum)
 CSW contacted APS social worker [name] [phone]) for collateral information.   CSW unable to reach left HIPAA compliant VM requesting return call.   Lum Croft, MSW, [place] 10/23/2024 9:39 AM

## 2024-10-23 NOTE — Progress Notes (Signed)
 SI/HI/AVH: denies  Behavior/Mood: appropriate/pleasant   Interaction/Group attendance: assertive/ 1 of 3 groups   Medication/PRNs: compliant/tylenol  x 1    Pain: 3/10 right shoulder  Other: na

## 2024-10-23 NOTE — Group Note (Signed)
 Date:  10/23/2024 Time:  4:14 PM  Group Topic/Focus:  Fresh air therapy with music and conversation    Participation Level:  Did Not Attend    [name] 10/23/2024, 4:14 PM

## 2024-10-23 NOTE — Progress Notes (Signed)
 [facility] MD Progress Note  10/23/2024 12:56 PM AYDIA MAJ  MRN:  [id number] Patient is a 68 year old female with a history of Schizoaffective Disorder, Bipolar Type who presents via [employer] under IVC, initiated by CM, Falencio with re-entry program, to [facility] for assessment. Per IVC, [name] has been diagnosed with Schizophrenia and is refusing to take medication. [name] isn't sleeping. Residents report her screaming and yelling all night long.  [name] responding to internal stimuli.  [name] verbally aggressive.  The respondent has locked all of the residents inside of the transitional housing home.  They could not leave the home which prompted the IVC.  Residents did not have access to food, the restroom or anything as a result of being locked in.  [name] is currently under Adult protective services care. Patient is admitted to [facility] unit with Q15 min safety monitoring. Multidisciplinary team approach is offered. Medication management; group/milieu therapy is offered.   Subjective:  Chart reviewed, case discussed in multidisciplinary meeting, patient seen during rounds.   Patient is noted to be in the day area.  She remains discharge focused and wanted to go home.  Provider updated patient waiting to hear back from APS social worker regarding her housing situation.  Patient continues to adamantly state that the transitional housing is her independent living and the people there were intruders.  Even though patient denies SI/HI and denies hallucinations she is noted to be responding to internal stimuli, talking to herself nonsensical.  Per nursing patient is taking her medications Past Psychiatric History: see h&P Family History:  Family History  Problem Relation Age of Onset   Diabetes Mother    CAD Mother    Lung cancer Father    Social History:  Social History   Substance and Sexual Activity  Alcohol Use Yes   Comment: beer occasionally     Social History    Substance and Sexual Activity  Drug Use No    Social History   Socioeconomic History   Marital status: Widowed    Spouse name: Not on file   Number of children: Not on file   Years of education: Not on file   Highest education level: Not on file  Occupational History   Not on file  Tobacco Use   Smoking status: Every Day    Current packs/day: 0.50    Average packs/day: 0.5 packs/day for 40.0 years (20.0 ttl pk-yrs)    Types: Cigarettes   Smokeless tobacco: Never  Vaping Use   Vaping status: Never Used  Substance and Sexual Activity   Alcohol use: Yes    Comment: beer occasionally   Drug use: No   Sexual activity: Not Currently    Birth control/protection: Post-menopausal  Other Topics Concern   Not on file  Social History Narrative   Not on file   Social Drivers of Health   Financial Resource Strain: Not on file  Food Insecurity: No Food Insecurity (10/17/2024)   Hunger Vital Sign    Worried About Running Out of Food in the Last Year: Never true    Ran Out of Food in the Last Year: Never true  Transportation Needs: No Transportation Needs (10/17/2024)   PRAPARE - Administrator, Civil Service (Medical): No    Lack of Transportation (Non-Medical): No  Recent Concern: Transportation Needs - Unmet Transportation Needs (10/14/2024)   PRAPARE - Transportation    Lack of Transportation (Medical): Yes    Lack of  Transportation (Non-Medical): Yes  Physical Activity: Not on file  Stress: Not on file  Social Connections: Unknown (10/17/2024)   Social Connection and Isolation Panel    Frequency of Communication with Friends and Family: Patient unable to answer    Frequency of Social Gatherings with Friends and Family: Patient unable to answer    Attends Religious Services: Patient unable to answer    Active Member of Clubs or Organizations: Patient declined    Attends Banker Meetings: Patient unable to answer    Marital Status: Widowed    Past Medical History:  Past Medical History:  Diagnosis Date   Eye globe prosthesis    GERD (gastroesophageal reflux disease)    History of blood transfusion 1973   related to abscess burst in my stomach   Hyperlipemia    Hyperlipidemia    Hypertension    Osteoarthritis of back    Lowerback    SVD (spontaneous vaginal delivery)    x 1, baby died at 2 wks of age   Type II diabetes mellitus (HCC)     Past Surgical History:  Procedure Laterality Date   APPENDECTOMY     COLONOSCOPY     DILATATION & CURETTAGE/HYSTEROSCOPY WITH MYOSURE N/A 02/25/2015   Procedure: DILATATION & CURETTAGE/HYSTEROSCOPY WITH MYOSURE;  Surgeon: Truman Corona, MD;  Location: [facility];  Service: Gynecology;  Laterality: N/A;   DILATION AND CURETTAGE OF UTERUS     ENUCLEATION Right 03/16/2017   ENUCLEATION Right 03/16/2017   Procedure: ENUCLEATION RIGHT EYE;  Surgeon: Loyd Kathryne Palm, MD;  Location: [facility] OR;  Service: Ophthalmology;  Laterality: Right;   EYE SURGERY     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6, 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   EYE SURGERY Right ~ 1974   S/P initial eye injury; scissors stuck in my eye   LAPAROSCOPIC CHOLECYSTECTOMY     SHOULDER ARTHROSCOPY WITH ROTATOR CUFF REPAIR Left    wire stitches per patient   SHOULDER ARTHROSCOPY WITH ROTATOR CUFF REPAIR Right     Current Medications: Current Facility-Administered Medications  Medication Dose Route Frequency Provider Last Rate Last Admin   acetaminophen  (TYLENOL ) tablet 650 mg  650 mg Oral Q6H PRN Tex Drilling, NP   650 mg at 10/22/24 1948   alum & mag hydroxide-simeth (MAALOX/MYLANTA) 200-200-20 MG/5ML suspension 30 mL  30 mL Oral Q4H PRN Nkwenti, Drilling, NP       cloNIDine (CATAPRES) tablet 0.1 mg  0.1 mg Oral BID PRN Tex Drilling, NP       divalproex  (DEPAKOTE ) DR tablet 500 mg  500 mg Oral Q12H Yulieth Carrender, MD   500 mg at 10/23/24 9077   losartan  (COZAAR ) tablet 25 mg  25 mg Oral Daily Nkwenti, Doris, NP   25 mg at 10/23/24 9077   magnesium   hydroxide (MILK OF MAGNESIA) suspension 30 mL  30 mL Oral Daily PRN Tex Drilling, NP       methimazole  (TAPAZOLE ) tablet 10 mg  10 mg Oral Daily Nkwenti, Doris, NP   10 mg at 10/23/24 9077   OLANZapine  (ZYPREXA ) injection 5 mg  5 mg Intramuscular TID PRN Tex Drilling, NP       OLANZapine  (ZYPREXA ) injection 5 mg  5 mg Intramuscular TID PRN Tex Drilling, NP       OLANZapine  (ZYPREXA ) tablet 10 mg  10 mg Oral QHS Celestina Gironda, MD   10 mg at 10/22/24 2138   OLANZapine  zydis (ZYPREXA ) disintegrating tablet 5 mg  5 mg Oral TID PRN Nkwenti,  Doris, NP       OLANZapine  zydis (ZYPREXA ) disintegrating tablet 5 mg  5 mg Oral TID PRN Tex Drilling, NP       pantoprazole  (PROTONIX ) EC tablet 40 mg  40 mg Oral Daily Nkwenti, Doris, NP   40 mg at 10/23/24 9077   traZODone  (DESYREL ) tablet 50 mg  50 mg Oral QHS PRN Tex Drilling, NP        Lab Results:  No results found for this or any previous visit (from the past 48 hours).   Blood Alcohol level:  Lab Results  Component Value Date   [facility] <15 10/14/2024   ETH <10 07/02/2022    Metabolic Disorder Labs: Lab Results  Component Value Date   HGBA1C 5.5 10/14/2024   MPG 111.15 10/14/2024   MPG 137 01/06/2022   Lab Results  Component Value Date   PROLACTIN 5.8 10/14/2024   Lab Results  Component Value Date   CHOL 167 10/14/2024   TRIG 68 10/14/2024   HDL 60 10/14/2024   CHOLHDL 2.8 10/14/2024   VLDL 14 10/14/2024   LDLCALC 93 10/14/2024   LDLCALC 73 03/13/2024    Physical Findings: AIMS:  , ,  ,  ,    CIWA:    COWS:      Psychiatric Specialty Exam:  Presentation  General Appearance:  Appropriate for Environment  Eye Contact: Fleeting  Speech: Normal Rate  Speech Volume: Decreased    Mood and Affect  Mood:fine  Affect: Flat   Thought Process  Thought Processes: Disorganized  Orientation:Partial  Thought Content:Illogical; Paranoid Ideation  Hallucinations: Denies  Ideas of Reference:Delusions;  Paranoia  Suicidal Thoughts: Denies  Homicidal Thoughts: Denies   Sensorium  Memory: Immediate Fair; Recent Fair  Judgment: Impaired  Insight: Shallow   Executive Functions  Concentration: Fair  Attention Span: Fair  Recall: Poor  Fund of Knowledge: Fair  Language: Fair   Psychomotor Activity  Psychomotor Activity: No data recorded  Musculoskeletal: Strength & Muscle Tone: within normal limits Gait & Station: normal Assets  Assets: Manufacturing Systems Engineer; Desire for Improvement; Social Support    Physical Exam: Physical Exam Vitals and nursing note reviewed.    ROS Blood pressure (!) 133/59, pulse 98, temperature 98.3 F (36.8 C), resp. rate 14, height 5' 8 (1.727 m), weight 48.3 kg, SpO2 98%. Body mass index is 16.19 kg/m.  Diagnosis: Principal Problem:   Schizophrenia, paranoid (HCC)    Treatment Plan Summary:  Safety and Monitoring:             -- Involuntary admission to inpatient psychiatric unit for safety, stabilization and treatment             -- Daily contact with patient to assess and evaluate symptoms and progress in treatment             -- Patient's case to be discussed in multi-disciplinary team meeting             -- Observation Level: q15 minute checks             -- Vital signs:  q12 hours             -- Precautions: suicide, elopement, and assault   2. Psychiatric Diagnoses and Treatment:             Depakote   500 mg twice daily Continue Zyprexa  10 mg nightly     -- The risks/benefits/side-effects/alternatives to this medication were discussed in detail with the patient and time was  given for questions. The patient consents to medication trial.                -- Metabolic profile and EKG monitoring obtained while on an atypical antipsychotic (BMI: Lipid Panel: HbgA1c: QTc:)              -- Encouraged patient to participate in unit milieu and in scheduled group therapies  4. Discharge Planning:   -- Social work and  case management to assist with discharge planning and identification of hospital follow-up needs prior to discharge  -- Estimated LOS: 3-4 days  Allyn Foil, MD 10/23/2024, 12:56 PM

## 2024-10-23 NOTE — BH IP Treatment Plan (Signed)
 Interdisciplinary Treatment and Diagnostic Plan Update  10/23/2024 Time of Session: 1500 Shari [name] MRN: [id number]  Principal Diagnosis: Schizophrenia, paranoid (HCC)  Secondary Diagnoses: Principal Problem:   Schizophrenia, paranoid (HCC)   Current Medications:  Current Facility-Administered Medications  Medication Dose Route Frequency Provider Last Rate Last Admin   acetaminophen  (TYLENOL ) tablet 650 mg  650 mg Oral Q6H PRN Tex Drilling, NP   650 mg at 10/22/24 1948   alum & mag hydroxide-simeth (MAALOX/MYLANTA) 200-200-20 MG/5ML suspension 30 mL  30 mL Oral Q4H PRN Tex Drilling, NP       cloNIDine (CATAPRES) tablet 0.1 mg  0.1 mg Oral BID PRN Tex Drilling, NP       divalproex  (DEPAKOTE ) DR tablet 500 mg  500 mg Oral Q12H Jadapalle, Sree, MD   500 mg at 10/23/24 9077   losartan  (COZAAR ) tablet 25 mg  25 mg Oral Daily Nkwenti, Doris, NP   25 mg at 10/23/24 9077   magnesium  hydroxide (MILK OF MAGNESIA) suspension 30 mL  30 mL Oral Daily PRN Tex Drilling, NP       methimazole  (TAPAZOLE ) tablet 10 mg  10 mg Oral Daily Nkwenti, Doris, NP   10 mg at 10/23/24 9077   OLANZapine  (ZYPREXA ) injection 5 mg  5 mg Intramuscular TID PRN Tex Drilling, NP       OLANZapine  (ZYPREXA ) injection 5 mg  5 mg Intramuscular TID PRN Tex Drilling, NP       OLANZapine  (ZYPREXA ) tablet 15 mg  15 mg Oral QHS Jadapalle, Sree, MD       OLANZapine  zydis (ZYPREXA ) disintegrating tablet 5 mg  5 mg Oral TID PRN Tex Drilling, NP       OLANZapine  zydis (ZYPREXA ) disintegrating tablet 5 mg  5 mg Oral TID PRN Tex Drilling, NP       pantoprazole  (PROTONIX ) EC tablet 40 mg  40 mg Oral Daily Nkwenti, Doris, NP   40 mg at 10/23/24 9077   traZODone  (DESYREL ) tablet 50 mg  50 mg Oral QHS PRN Tex Drilling, NP       PTA Medications: Medications Prior to Admission  Medication Sig Dispense Refill Last Dose/Taking   Ascorbic Acid (VITAMIN C PO) Take 1 tablet by mouth daily. (Patient not taking: Reported on  10/14/2024)      divalproex  (DEPAKOTE ) 500 MG DR tablet Take 1 tablet (500 mg total) by mouth at bedtime. (Patient not taking: Reported on 10/14/2024) 30 tablet 1    losartan  (COZAAR ) 25 MG tablet Take 1 tablet (25 mg total) by mouth daily. (Patient not taking: Reported on 10/14/2024) 90 tablet 1    methimazole  (TAPAZOLE ) 10 MG tablet Take 1 tablet (10 mg total) by mouth daily. (Patient not taking: Reported on 10/14/2024) 90 tablet 1    OLANZapine  (ZYPREXA ) 10 MG tablet Take 1 tablet (10 mg total) by mouth at bedtime. (Patient not taking: Reported on 10/14/2024) 30 tablet 1    pantoprazole  (PROTONIX ) 40 MG tablet Take 1 tablet (40 mg total) by mouth daily. (Patient not taking: Reported on 10/14/2024) 90 tablet 0    propranolol  (INDERAL ) 10 MG tablet Take 1 tablet (10 mg total) by mouth 2 (two) times daily. (Patient not taking: Reported on 10/14/2024) 180 tablet 1    VITAMIN D PO Take 1 tablet by mouth daily. (Patient not taking: Reported on 10/14/2024)      VITAMIN E PO Take 1 tablet by mouth daily. (Patient not taking: Reported on 10/14/2024)       Patient Stressors:  Medication change or noncompliance    Patient Strengths: Communication skills   Treatment Modalities: Medication Management, Group therapy, Case management,  1 to 1 session with clinician, Psychoeducation, Recreational therapy.   Physician Treatment Plan for Primary Diagnosis: Schizophrenia, paranoid (HCC) Long Term Goal(s): Improvement in symptoms so as ready for discharge   Short Term Goals: Ability to identify changes in lifestyle to reduce recurrence of condition will improve Ability to verbalize feelings will improve Ability to disclose and discuss suicidal ideas Ability to demonstrate self-control will improve Ability to identify and develop effective coping behaviors will improve Ability to maintain clinical measurements within normal limits will improve  Medication Management: Evaluate patient's response, side  effects, and tolerance of medication regimen.  Therapeutic Interventions: 1 to 1 sessions, Unit Group sessions and Medication administration.  Evaluation of Outcomes: Progressing  Physician Treatment Plan for Secondary Diagnosis: Principal Problem:   Schizophrenia, paranoid (HCC)  Long Term Goal(s): Improvement in symptoms so as ready for discharge   Short Term Goals: Ability to identify changes in lifestyle to reduce recurrence of condition will improve Ability to verbalize feelings will improve Ability to disclose and discuss suicidal ideas Ability to demonstrate self-control will improve Ability to identify and develop effective coping behaviors will improve Ability to maintain clinical measurements within normal limits will improve     Medication Management: Evaluate patient's response, side effects, and tolerance of medication regimen.  Therapeutic Interventions: 1 to 1 sessions, Unit Group sessions and Medication administration.  Evaluation of Outcomes: Progressing   RN Treatment Plan for Primary Diagnosis: Schizophrenia, paranoid (HCC) Long Term Goal(s): Knowledge of disease and therapeutic regimen to maintain health will improve  Short Term Goals: Ability to remain free from injury will improve, Ability to verbalize frustration and anger appropriately will improve, Ability to demonstrate self-control, Ability to participate in decision making will improve, Ability to verbalize feelings will improve, Ability to disclose and discuss suicidal ideas, Ability to identify and develop effective coping behaviors will improve, and Compliance with prescribed medications will improve  Medication Management: RN will administer medications as ordered by provider, will assess and evaluate patient's response and provide education to patient for prescribed medication. RN will report any adverse and/or side effects to prescribing provider.  Therapeutic Interventions: 1 on 1 counseling  sessions, Psychoeducation, Medication administration, Evaluate responses to treatment, Monitor vital signs and CBGs as ordered, Perform/monitor CIWA, COWS, AIMS and Fall Risk screenings as ordered, Perform wound care treatments as ordered.  Evaluation of Outcomes: Progressing   LCSW Treatment Plan for Primary Diagnosis: Schizophrenia, paranoid (HCC) Long Term Goal(s): Safe transition to appropriate next level of care at discharge, Engage patient in therapeutic group addressing interpersonal concerns.  Short Term Goals: Engage patient in aftercare planning with referrals and resources, Increase social support, Increase ability to appropriately verbalize feelings, Increase emotional regulation, Facilitate acceptance of mental health diagnosis and concerns, Facilitate patient progression through stages of change regarding substance use diagnoses and concerns, Identify triggers associated with mental health/substance abuse issues, and Increase skills for wellness and recovery  Therapeutic Interventions: Assess for all discharge needs, 1 to 1 time with Social worker, Explore available resources and support systems, Assess for adequacy in community support network, Educate family and significant other(s) on suicide prevention, Complete Psychosocial Assessment, Interpersonal group therapy.  Evaluation of Outcomes: Progressing   Progress in Treatment: Attending groups: Yes. and No. Participating in groups: Yes. and No. Taking medication as prescribed: Yes. Toleration medication: Yes. Family/Significant other contact made: No, will contact:  if  given permission.  Patient understands diagnosis: No. Discussing patient identified problems/goals with staff: No. Medical problems stabilized or resolved: Yes. Denies suicidal/homicidal ideation: Yes. Issues/concerns per patient self-inventory: No. Other: none.   New problem(s) identified: No, Describe:  None identified 10/23/24 Update: No changes at this  time.    New Short Term/Long Term Goal(s): elimination of symptoms of psychosis, medication management for mood stabilization; elimination of SI thoughts; development of comprehensive mental wellness plan. 10/23/24 Update: No changes at this time.   Patient Goals:  I haven't set any goals for the hospital yet. I don't need psychiatric help 10/23/24 Update: No changes at this time.   Discharge Plan or Barriers: CSW will assist with appropriate discharge plan 10/23/24 Update: No changes at this time.   Reason for Continuation of Hospitalization: Delusions  Medication stabilization   Estimated Length of Stay: 1 to 7 days 10/23/24 Update: No changes at this time.  Last 3 [place] Suicide Severity Risk Score: Flowsheet Row Admission (Current) from 10/17/2024 in [facility] from 10/14/2024 in [facility] ED to Hosp-Admission (Discharged) from 03/13/2024 in [facility]  C-SSRS RISK CATEGORY No Risk No Risk No Risk    Last PHQ 2/9 Scores:     No data to display          Scribe for Treatment Team: Nadara JONELLE Fam, LCSW 10/23/2024 3:12 PM

## 2024-10-23 NOTE — BHH Counselor (Signed)
 CSW received return call from [name] at [facility]. Arch reports the original report had been closed out after they placed pt in the transitional housing.   Arch reports CSW needs to submit another report.   CSW submitted report to [facility] APS requesting guardianship for pt.   Lum Croft, MSW, [place] 10/23/2024 1:08 PM

## 2024-10-24 DIAGNOSIS — F200 Paranoid schizophrenia: Secondary | ICD-10-CM | POA: Diagnosis not present

## 2024-10-24 NOTE — Group Note (Signed)
 Date:  10/24/2024 Time:  4:18 PM  Group Topic/Focus:  Developing a Wellness Toolbox:   The focus of this group is to help patients develop a wellness toolbox with skills and strategies to promote recovery upon discharge. Overcoming Stress:   The focus of this group is to define stress and help patients assess their triggers.    Participation Level:  Active  Participation Quality:  Attentive  Affect:  Appropriate  Cognitive:  Alert  Insight: Appropriate  Engagement in Group:  Engaged  Modes of Intervention:  Discussion  Additional Comments:     Garen CINDERELLA Daring 10/24/2024, 4:18 PM

## 2024-10-24 NOTE — Plan of Care (Signed)
   Problem: Activity: Goal: Interest or engagement in activities will improve Outcome: Progressing   Problem: Coping: Goal: Ability to demonstrate self-control will improve Outcome: Progressing

## 2024-10-24 NOTE — Group Note (Signed)
 Recreation Therapy Group Note   Group Topic:Health and Wellness  Group Date: 10/24/2024 Start Time: 1400 End Time: 1450 Facilitators: Celestia Jeoffrey BRAVO, LRT, CTRS Location: Courtyard  Group Description: Outdoor Recreation. Patients had the option to play corn hole, ring toss, UNO, or listening to music while outside in the courtyard getting fresh air and sunlight. Patients helped water  and prune the raised garden beds. LRT and patients discussed things that they enjoy doing in their free time outside of the hospital. LRT encouraged patients to drink water  after being active and getting their heart rate up.   Goal Area(s) Addressed: Patient will identify leisure interests.  Patient will practice healthy decision making. Patient will engage in recreation activity.   Affect/Mood: Flat   Participation Level: Minimal    Clinical Observations/Individualized Feedback: Shari [name] came late to group and left early. Pt was noted to be talking to herself while outside.  Plan: Continue to engage patient in RT group sessions 2-3x/week.   Jeoffrey BRAVO Celestia, LRT, CTRS 10/24/2024 4:36 PM

## 2024-10-24 NOTE — Plan of Care (Signed)
  Problem: Education: Goal: Emotional status will improve Outcome: Progressing   Problem: Activity: Goal: Sleeping patterns will improve Outcome: Progressing   Problem: Coping: Goal: Ability to demonstrate self-control will improve Outcome: Progressing

## 2024-10-24 NOTE — Progress Notes (Signed)
 [facility] MD Progress Note  10/24/2024 11:29 AM Shari [name]  MRN:  [id number] Patient is a 68 year old female with a history of Schizoaffective Disorder, Bipolar Type who presents via [employer] under IVC, initiated by CM, Falencio with re-entry program, to [facility] for assessment. Per IVC, [name] has been diagnosed with Schizophrenia and is refusing to take medication. [name] isn't sleeping. Residents report her screaming and yelling all night long.  [name] responding to internal stimuli.  [name] verbally aggressive.  The respondent has locked all of the residents inside of the transitional housing home.  They could not leave the home which prompted the IVC.  Residents did not have access to food, the restroom or anything as a result of being locked in.  [name] is currently under Adult protective services care. Patient is admitted to [facility] unit with Q15 min safety monitoring. Multidisciplinary team approach is offered. Medication management; group/milieu therapy is offered.   Subjective:  Chart reviewed, case discussed in multidisciplinary meeting, patient seen during rounds.  Patient is noted to be resting in her bed.  She reports feeling tired today morning after the breakfast wanting to take a nap.  She offers no other complaints.  Patient was able to talk about her medications including methimazole  for her thyroid  issues.  She remains delusional about transitional house being her house and the people living intruders.  She remains adamant about reaching out to the sheriff department to know the truth.  She demands to be discharged back to that house in spite of the team trying to explain to her that it is a transitional house.  She denies having any family members stating most of her family members died and she has 2 siblings that live far away and she is not willing to reach out to them.  She denies having any children alive.  [name] not endorsing SI/HI and is not endorsing  hallucinations but continues to display paranoid delusions about her living situation and people involved in her life in the community patient is noted to be talking to herself on multiple occasions  Past Psychiatric History: see h&P Family History:  Family History  Problem Relation Age of Onset   Diabetes Mother    CAD Mother    Lung cancer Father    Social History:  Social History   Substance and Sexual Activity  Alcohol Use Yes   Comment: beer occasionally     Social History   Substance and Sexual Activity  Drug Use No    Social History   Socioeconomic History   Marital status: Widowed    Spouse name: Not on file   Number of children: Not on file   Years of education: Not on file   Highest education level: Not on file  Occupational History   Not on file  Tobacco Use   Smoking status: Every Day    Current packs/day: 0.50    Average packs/day: 0.5 packs/day for 40.0 years (20.0 ttl pk-yrs)    Types: Cigarettes   Smokeless tobacco: Never  Vaping Use   Vaping status: Never Used  Substance and Sexual Activity   Alcohol use: Yes    Comment: beer occasionally   Drug use: No   Sexual activity: Not Currently    Birth control/protection: Post-menopausal  Other Topics Concern   Not on file  Social History Narrative   Not on file   Social Drivers of Health   Financial Resource Strain: Not on  file  Food Insecurity: No Food Insecurity (10/17/2024)   Hunger Vital Sign    Worried About Running Out of Food in the Last Year: Never true    Ran Out of Food in the Last Year: Never true  Transportation Needs: No Transportation Needs (10/17/2024)   PRAPARE - Administrator, Civil Service (Medical): No    Lack of Transportation (Non-Medical): No  Recent Concern: Transportation Needs - Unmet Transportation Needs (10/14/2024)   PRAPARE - Administrator, Civil Service (Medical): Yes    Lack of Transportation (Non-Medical): Yes  Physical Activity: Not on  file  Stress: Not on file  Social Connections: Unknown (10/17/2024)   Social Connection and Isolation Panel    Frequency of Communication with Friends and Family: Patient unable to answer    Frequency of Social Gatherings with Friends and Family: Patient unable to answer    Attends Religious Services: Patient unable to answer    Active Member of Clubs or Organizations: Patient declined    Attends Banker Meetings: Patient unable to answer    Marital Status: Widowed   Past Medical History:  Past Medical History:  Diagnosis Date   Eye globe prosthesis    GERD (gastroesophageal reflux disease)    History of blood transfusion 1973   related to abscess burst in my stomach   Hyperlipemia    Hyperlipidemia    Hypertension    Osteoarthritis of back    Lowerback    SVD (spontaneous vaginal delivery)    x 1, baby died at 2 wks of age   Type II diabetes mellitus (HCC)     Past Surgical History:  Procedure Laterality Date   APPENDECTOMY     COLONOSCOPY     DILATATION & CURETTAGE/HYSTEROSCOPY WITH MYOSURE N/A 02/25/2015   Procedure: DILATATION & CURETTAGE/HYSTEROSCOPY WITH MYOSURE;  Surgeon: Truman Corona, MD;  Location: [facility];  Service: Gynecology;  Laterality: N/A;   DILATION AND CURETTAGE OF UTERUS     ENUCLEATION Right 03/16/2017   ENUCLEATION Right 03/16/2017   Procedure: ENUCLEATION RIGHT EYE;  Surgeon: Loyd Kathryne Palm, MD;  Location: [facility] OR;  Service: Ophthalmology;  Laterality: Right;   EYE SURGERY     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6, 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   EYE SURGERY Right ~ 1974   S/P initial eye injury; scissors stuck in my eye   LAPAROSCOPIC CHOLECYSTECTOMY     SHOULDER ARTHROSCOPY WITH ROTATOR CUFF REPAIR Left    wire stitches per patient   SHOULDER ARTHROSCOPY WITH ROTATOR CUFF REPAIR Right     Current Medications: Current Facility-Administered Medications  Medication Dose Route Frequency Provider Last Rate Last Admin   acetaminophen  (TYLENOL ) tablet 650 mg   650 mg Oral Q6H PRN Tex Drilling, NP   650 mg at 10/23/24 1708   alum & mag hydroxide-simeth (MAALOX/MYLANTA) 200-200-20 MG/5ML suspension 30 mL  30 mL Oral Q4H PRN Nkwenti, Doris, NP       cloNIDine  (CATAPRES ) tablet 0.1 mg  0.1 mg Oral BID PRN Tex Drilling, NP       divalproex  (DEPAKOTE ) DR tablet 500 mg  500 mg Oral Q12H Lillyan Hitson, MD   500 mg at 10/24/24 9049   losartan  (COZAAR ) tablet 25 mg  25 mg Oral Daily Nkwenti, Doris, NP   25 mg at 10/24/24 0951   magnesium  hydroxide (MILK OF MAGNESIA) suspension 30 mL  30 mL Oral Daily PRN Tex Drilling, NP       methimazole  (  TAPAZOLE ) tablet 10 mg  10 mg Oral Daily Tex Drilling, NP   10 mg at 10/24/24 1000   OLANZapine  (ZYPREXA ) injection 5 mg  5 mg Intramuscular TID PRN Tex Drilling, NP       OLANZapine  (ZYPREXA ) injection 5 mg  5 mg Intramuscular TID PRN Tex Drilling, NP       OLANZapine  (ZYPREXA ) tablet 15 mg  15 mg Oral QHS Makesha Belitz, MD   15 mg at 10/23/24 2142   OLANZapine  zydis (ZYPREXA ) disintegrating tablet 5 mg  5 mg Oral TID PRN Tex Drilling, NP       OLANZapine  zydis (ZYPREXA ) disintegrating tablet 5 mg  5 mg Oral TID PRN Tex Drilling, NP       pantoprazole  (PROTONIX ) EC tablet 40 mg  40 mg Oral Daily Nkwenti, Doris, NP   40 mg at 10/24/24 0951   traZODone  (DESYREL ) tablet 50 mg  50 mg Oral QHS PRN Tex Drilling, NP        Lab Results:  No results found for this or any previous visit (from the past 48 hours).   Blood Alcohol level:  Lab Results  Component Value Date   [facility] <15 10/14/2024   ETH <10 07/02/2022    Metabolic Disorder Labs: Lab Results  Component Value Date   HGBA1C 5.5 10/14/2024   MPG 111.15 10/14/2024   MPG 137 01/06/2022   Lab Results  Component Value Date   PROLACTIN 5.8 10/14/2024   Lab Results  Component Value Date   CHOL 167 10/14/2024   TRIG 68 10/14/2024   HDL 60 10/14/2024   CHOLHDL 2.8 10/14/2024   VLDL 14 10/14/2024   LDLCALC 93 10/14/2024   LDLCALC 73  03/13/2024    Physical Findings: AIMS:  , ,  ,  ,    CIWA:    COWS:      Psychiatric Specialty Exam:  Presentation  General Appearance:  Appropriate for Environment  Eye Contact: Fleeting  Speech: Normal Rate  Speech Volume: Decreased    Mood and Affect  Mood:fine  Affect: Flat   Thought Process  Thought Processes: Disorganized  Orientation:Partial  Thought Content:Illogical; Paranoid Ideation  Hallucinations: Denies  Ideas of Reference:Delusions; Paranoia  Suicidal Thoughts: Denies  Homicidal Thoughts: Denies   Sensorium  Memory: Immediate Fair; Recent Fair  Judgment: Impaired  Insight: Shallow   Executive Functions  Concentration: Fair  Attention Span: Fair  Recall: Poor  Fund of Knowledge: Fair  Language: Fair   Psychomotor Activity  Psychomotor Activity: No data recorded  Musculoskeletal: Strength & Muscle Tone: within normal limits Gait & Station: normal Assets  Assets: Manufacturing Systems Engineer; Desire for Improvement; Social Support    Physical Exam: Physical Exam Vitals and nursing note reviewed.    ROS Blood pressure 125/63, pulse 96, temperature 97.8 F (36.6 C), resp. rate 16, height 5' 8 (1.727 m), weight 48.3 kg, SpO2 100%. Body mass index is 16.19 kg/m.  Diagnosis: Principal Problem:   Schizophrenia, paranoid [facility])    Treatment Plan Summary:  Safety and Monitoring:             -- Involuntary admission to inpatient psychiatric unit for safety, stabilization and treatment             -- Daily contact with patient to assess and evaluate symptoms and progress in treatment             -- Patient's case to be discussed in multi-disciplinary team meeting             --  Observation Level: q15 minute checks             -- Vital signs:  q12 hours             -- Precautions: suicide, elopement, and assault   2. Psychiatric Diagnoses and Treatment:             Depakote   500 mg twice daily-Depakote  level  on 10/17/2024 is subtherapeutic-28-Will recheck the Depakote  levels Increase Zyprexa  to 15 mg nightly     -- The risks/benefits/side-effects/alternatives to this medication were discussed in detail with the patient and time was given for questions. The patient consents to medication trial.                -- Metabolic profile and EKG monitoring obtained while on an atypical antipsychotic (BMI: Lipid Panel: HbgA1c: QTc:)              -- Encouraged patient to participate in unit milieu and in scheduled group therapies  4. Discharge Planning:   -- Social work and case management to assist with discharge planning and identification of hospital follow-up needs prior to discharge  -- Estimated LOS: 3-4 days  Allyn Foil, MD 10/24/2024, 11:29 AM

## 2024-10-24 NOTE — Group Note (Signed)
 LCSW Group Therapy Note  Group Date: 10/24/2024 Start Time: 1315 End Time: 1345   Type of Therapy and Topic:  Group Therapy - Healthy vs Unhealthy Coping Skills  Participation Level:  Did Not Attend   Description of Group The focus of this group was to determine what unhealthy coping techniques typically are used by group members and what healthy coping techniques would be helpful in coping with various problems. Patients were guided in becoming aware of the differences between healthy and unhealthy coping techniques. Patients were asked to identify 2-3 healthy coping skills they would like to learn to use more effectively.  Therapeutic Goals Patients learned that coping is what human beings do all day long to deal with various situations in their lives Patients defined and discussed healthy vs unhealthy coping techniques Patients identified their preferred coping techniques and identified whether these were healthy or unhealthy Patients determined 2-3 healthy coping skills they would like to become more familiar with and use more often. Patients provided support and ideas to each other   Summary of Patient Progress: X  Therapeutic Modalities Cognitive Behavioral Therapy Motivational Interviewing  Shari [name], [place] 10/24/2024  1:39 PM

## 2024-10-24 NOTE — Progress Notes (Signed)
 Behavior: Patient has flat affect. Short responses to questioning. Denies SI, HI or AVH.    Psych assessment:  Patient denies AVH but observed talking to herself.  Interaction / Group attendance:   Patient having meals with peers, observed watching TV with minimal interaction  Medication/ PRNs:  Medications given as prescribed. No PRNs this shift  Pain:  Patient denies pain.  15 min checks in place for safety.   No falls or safety concerns this shift. Ongoing care plan

## 2024-10-24 NOTE — Group Note (Signed)
 Date:  10/24/2024 Time:  10:54 AM  Group Topic/Focus:  Healthy Communication:   The focus of this group is to discuss communication, barriers to communication, as well as healthy ways to communicate with others.    Participation Level:  Did Not Attend   [name] 10/24/2024, 10:54 AM

## 2024-10-24 NOTE — Progress Notes (Signed)
   10/24/24 0114  Psych Admission Type (Psych Patients Only)  Admission Status Involuntary  Psychosocial Assessment  Patient Complaints None  Eye Contact Fair  Facial Expression Flat  Affect Appropriate to circumstance  Speech Soft  Interaction Assertive  Motor Activity Slow  Appearance/Hygiene In scrubs  Behavior Characteristics Appropriate to situation  Mood Pleasant  Thought Process  Coherency Disorganized  Content Delusions  Delusions None reported or observed  Perception WDL  Hallucination None reported or observed  Judgment Impaired  Confusion None  Danger to Self  Current suicidal ideation? Denies  Description of Suicide Plan n/a  Agreement Not to Harm Self Yes  Description of Agreement verbal  Danger to Others  Danger to Others None reported or observed    Patient remained withdrawn to her room for majority of the shift, only out for evening snacks. Compliant with scheduled medications. No complaints voiced. Noted responding to internal stimuli while in her room aloud, although denies when asked directly by nurse. Slept throughout the night. All orders maintained as written.   Estimated Sleeping Duration (Last 24 Hours): 6.25-8.00 hours (Due to Daylight Saving Time, the durations displayed may not accurately represent documentation during the time change interval)

## 2024-10-25 DIAGNOSIS — F200 Paranoid schizophrenia: Secondary | ICD-10-CM | POA: Diagnosis not present

## 2024-10-25 LAB — VALPROIC ACID LEVEL: Valproic Acid Lvl: 32 ug/mL — ABNORMAL LOW (ref 50–100)

## 2024-10-25 MED ORDER — DIVALPROEX SODIUM 250 MG PO DR TAB
750.0000 mg | DELAYED_RELEASE_TABLET | Freq: Two times a day (BID) | ORAL | Status: DC
  Administered 2024-10-26 – 2024-11-12 (×36): 750 mg via ORAL
  Filled 2024-10-25 (×36): qty 3

## 2024-10-25 NOTE — Plan of Care (Signed)
  Problem: Education: Goal: Emotional status will improve Outcome: Progressing   Problem: Activity: Goal: Interest or engagement in activities will improve Outcome: Progressing   Problem: Coping: Goal: Ability to demonstrate self-control will improve Outcome: Progressing   Problem: Education: Goal: Mental status will improve Outcome: Not Progressing   Problem: Coping: Goal: Ability to verbalize frustrations and anger appropriately will improve Outcome: Not Progressing

## 2024-10-25 NOTE — Group Note (Signed)
 Physical/Occupational Therapy Group Note  Group Topic: UE Therex   Group Date: 10/25/2024 Start Time: 1500 End Time: 1543 Facilitators: Lavonda Therisa CROME, OT   Group Description:  Group instructed in series of upper extremities exercises, aimed to promote strength, flexibility, range of motion and functional endurance.  Patients provided cuing for proper mechanics and proper pace of exercise; exercises adjusted as necessary for individualized patient needs.  Patient also engaged in cognitive components throughout session, working to integrate attention to task, command following, turn-taking and appropriate social interaction throughout session.  Allowed to ask questions as appropriate, and encouraged to identify specific exercises that they could complete independently outside of group sessions.     Therapeutic Goal(s):  * Demonstrate appropriate performance of upper extremity exercises to promote strength, flexibility, range of motion and functional endurance  * Identify 2-3 specific upper extremity exercises to complete as home exercise program outside of group session  Individual Participation: Pt attended group and participated with moderate cueing   Participation Level: Moderate   Participation Quality: Moderate Cues   Behavior: Alert   Speech/Thought Process: Focused   Affect/Mood: Appropriate   Insight: Moderate   Judgement: Moderate   Modes of Intervention: Activity and Education  Patient Response to Interventions:  Attentive   Plan: Continue to engage patient in PT/OT groups 1 - 2x/week.  10/25/2024  Therisa CROME Lavonda, OT   Therisa Lavonda, OTD OTR/L  10/25/24, 4:03 PM

## 2024-10-25 NOTE — Group Note (Signed)
 Date:  10/25/2024 Time:  10:54 AM  Group Topic/Focus:  Personal Choices and Values:   The focus of this group is to help patients assess and explore the importance of values in their lives, how their values affect their decisions, how they express their values and what opposes their expression.    Participation Level:  Did Not Attend   Shari [name] 10/25/2024, 10:54 AM

## 2024-10-25 NOTE — NC FL2 (Signed)
 [place]  MEDICAID FL2 LEVEL OF CARE FORM     IDENTIFICATION  Patient Name: Shari [name] Birthdate: [birth year] Sex: female Admission Date (Current Location): 10/17/2024  [place] Number:  [name] [id number] Facility and Address:  [facility], [address], [place], [place] [address]      Provider Number: [id number]  Attending Physician Name and Address:  Donnelly Mellow, MD  Relative Name and Phone Number:  N/A    Current Level of Care: Hospital Recommended Level of Care: Assisted Living Facility, Family Care Home Prior Approval Number:    Date Approved/Denied:   PASRR Number:    Discharge Plan: Other (Comment) (Assisted Living Facility or Family Care Home)    Current Diagnoses: Patient Active Problem List   Diagnosis Date Noted   Schizophrenia, paranoid (HCC) 10/17/2024   Sinus tachycardia 03/15/2024   PAC (premature atrial contraction) 03/15/2024   Abnormal echocardiogram 03/15/2024   Hypokalemia 03/13/2024   Hypomagnesemia 03/13/2024   Chest pain 03/13/2024   Shortness of breath 03/13/2024   Fever 03/13/2024   Hyperlipidemia 03/13/2024   Schizoaffective disorder, bipolar type (HCC) 01/05/2022   Schizophrenia (HCC) 01/05/2022   Encounter for psychiatric assessment 11/21/2021   Paranoid schizophrenia (HCC) 10/20/2021   Mild cognitive impairment 10/18/2021   Gastroesophageal reflux disease 02/26/2021   Hypercholesterolemia 02/26/2021   Hypertension 02/26/2021   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   Graves disease 09/20/2019   Palpitations 05/29/2019   Hyperthyroidism 05/29/2019   Mixed dyslipidemia 05/29/2019   Paroxysmal atrial fibrillation (HCC) 05/29/2019   PMB (postmenopausal bleeding) 03/21/2018   Urinary retention 03/21/2018   Uterine enlargement 03/21/2018   Surgery, elective 03/16/2017    Orientation RESPIRATION BLADDER Height & Weight     Self, Time  Normal  Continent Weight: 48.3 kg Height:  5' 8 (172.7 cm)  BEHAVIORAL SYMPTOMS/MOOD NEUROLOGICAL BOWEL NUTRITION STATUS      Continent Diet  AMBULATORY STATUS COMMUNICATION OF NEEDS Skin   Independent Verbally Normal                       Personal Care Assistance Level of Assistance              Functional Limitations Info             SPECIAL CARE FACTORS FREQUENCY                       Contractures Contractures Info: Not present    Additional Factors Info  Code Status, Allergies Code Status Info: Full Allergies Info: Aspirin   Chocolate   Cocoa   Penicillins   Sulfa Antibiotics           Current Medications (10/25/2024):  This is the current hospital active medication list Current Facility-Administered Medications  Medication Dose Route Frequency Provider Last Rate Last Admin   acetaminophen  (TYLENOL ) tablet 650 mg  650 mg Oral Q6H PRN Tex Drilling, NP   650 mg at 10/24/24 2133   alum & mag hydroxide-simeth (MAALOX/MYLANTA) 200-200-20 MG/5ML suspension 30 mL  30 mL Oral Q4H PRN Tex Drilling, NP       cloNIDine  (CATAPRES ) tablet 0.1 mg  0.1 mg Oral BID PRN Tex Drilling, NP       divalproex  (DEPAKOTE ) DR tablet 500 mg  500 mg Oral Q12H Jadapalle, Sree, MD   500 mg at 10/25/24 9062   losartan  (COZAAR ) tablet 25 mg  25 mg Oral Daily Nkwenti,  Doris, NP   25 mg at 10/25/24 9062   magnesium  hydroxide (MILK OF MAGNESIA) suspension 30 mL  30 mL Oral Daily PRN Tex Drilling, NP       methimazole  (TAPAZOLE ) tablet 10 mg  10 mg Oral Daily Nkwenti, Doris, NP   10 mg at 10/25/24 9062   OLANZapine  (ZYPREXA ) injection 5 mg  5 mg Intramuscular TID PRN Tex Drilling, NP       OLANZapine  (ZYPREXA ) injection 5 mg  5 mg Intramuscular TID PRN Tex Drilling, NP       OLANZapine  (ZYPREXA ) tablet 15 mg  15 mg Oral QHS Jadapalle, Sree, MD   15 mg at 10/24/24 2133   OLANZapine  zydis (ZYPREXA ) disintegrating tablet 5 mg  5 mg Oral TID PRN Tex Drilling, NP       OLANZapine   zydis (ZYPREXA ) disintegrating tablet 5 mg  5 mg Oral TID PRN Tex Drilling, NP       pantoprazole  (PROTONIX ) EC tablet 40 mg  40 mg Oral Daily Nkwenti, Doris, NP   40 mg at 10/25/24 9062   traZODone  (DESYREL ) tablet 50 mg  50 mg Oral QHS PRN Tex Drilling, NP         Discharge Medications: Please see discharge summary for a list of discharge medications.  Relevant Imaging Results:  Relevant Lab Results:   Additional Information SSN# [id number]  Alveta CHRISTELLA Kerns, LCSW

## 2024-10-25 NOTE — Group Note (Signed)
 Recreation Therapy Group Note   Group Topic:Leisure Education  Group Date: 10/25/2024 Start Time: 1400 End Time: 1445 Facilitators: Celestia Jeoffrey BRAVO, LRT, CTRS Location: Dayroom  Group Description: Leisure. Patients were given the option to choose from journaling, coloring, drawing, making origami, playing with playdoh, listening to music or singing karaoke. LRT and pts discussed the meaning of leisure, the importance of participating in leisure during their free time/when they're outside of the hospital, as well as how our leisure interests can also serve as coping skills.   Goal Area(s) Addressed:  Patient will identify a current leisure interest.  Patient will learn the definition of "leisure". Patient will practice making a positive decision. Patient will have the opportunity to try a new leisure activity. Patient will communicate with peers and LRT.    Affect/Mood: Appropriate   Participation Level: Moderate   Participation Quality: Independent   Behavior: Cooperative   Speech/Thought Process: Loose association   Insight: Fair   Judgement: Fair    Modes of Intervention: Education, Exploration, and Music   Patient Response to Interventions:  Receptive   Education Outcome:  In group clarification offered    Clinical Observations/Individualized Feedback: Shari [name] was mostly active in their participation of session activities and group discussion. Pt identified word searches as things she does in her free time.    Plan: Continue to engage patient in RT group sessions 2-3x/week.   Jeoffrey BRAVO Celestia, LRT, CTRS 10/25/2024 4:46 PM

## 2024-10-25 NOTE — [facility] Group Notes (Signed)
 Spirituality Group   Group Goal: Support / Education around grief and loss   Group Description: Following introductions and group rules, group members engaged in facilitated group dialog and support around topic of loss, with particular support around experiences of loss in their lives. Group members identified types of loss (relationships / self / things) as well as patterns, circumstances, and changes that precipitate loss. Reflection invited on thoughts / feelings around loss, normalized grief responses, and recognized variety in grief experience. Group noted Worden's four tasks of grief in discussion. Group drew on Adlerian / Rogerian, narrative, MI, with Yalom's group therapy as a primary framework.   Observations: Ms Shari [name] was present and participated in group discussion. She expressed view of being independent and this chaplain invited reframing around the times we need to receive assistance.  Kenon Delashmit L. Delores HERO.Div

## 2024-10-25 NOTE — Plan of Care (Signed)

## 2024-10-25 NOTE — BHH Counselor (Signed)
 Patient met with social worker, [name] with [facility] concerning APS report made on patient's behalf. Patient was able to explain to social worker her reason for being in treatment but was scattered in speech and disorganized. Patient was agitated and was not the most forthcoming with information.   Social worker engaged in discharge planning with the patient and attempted to gain collateral. Patient denied having any friends or family that can be contacted on her behalf.  Eventually, patient asked "What is this boiling down to?" When asked by the social worker of details of the reports made prior to her admission, patient denied.   Social worker explained that the next step is for DSS to petition for guardianship on behalf of the patient.   Social worker confirmed that it is apparent that patient is unable to make decisions independently.   Social worker to follow up with patient's primary clinical social worker to continue to engage in safe discharge planning and case management.   Shari [name], MSW, LCSWA 10/25/2024 3:47 PM

## 2024-10-25 NOTE — Progress Notes (Signed)
 Behavior: No behavioral concerns this shift. Denies SI/HI and AVH.   Psych assessment:  Flat affect but responsive during interaction with short phrases or one to 2 word responses.  Interaction / Group attendance:   Patient attended several group sessions and meal time with peers.   Medication/ PRNs:  Patient taking medications as prescribed. No PRNs this shift.  Pain:  No complaints of pain. No nonverbal indications of pain observed.   15 min checks in place for safety.   Ongoing monitoring continues.

## 2024-10-25 NOTE — Progress Notes (Signed)
 [facility] MD Progress Note  10/25/2024 3:19 PM Shari [name]  MRN:  [id number] Patient is a 68 year old female with a history of Schizoaffective Disorder, Bipolar Type who presents via [facility] under IVC, initiated by CM, Falencio with re-entry program, to [facility] for assessment. Per IVC, [name] has been diagnosed with Schizophrenia and is refusing to take medication. [name] isn't sleeping. Residents report her screaming and yelling all night long.  [name] responding to internal stimuli.  [name] verbally aggressive.  The respondent has locked all of the residents inside of the transitional housing home.  They could not leave the home which prompted the IVC.  Residents did not have access to food, the restroom or anything as a result of being locked in.  [name] is currently under Adult protective services care. Patient is admitted to [facility] unit with Q15 min safety monitoring. Multidisciplinary team approach is offered. Medication management; group/milieu therapy is offered.   Subjective:  Chart reviewed, case discussed in multidisciplinary meeting, patient seen during rounds.  Patient is noted to be resting in her room.  [name] visibly very frustrated about the IVC hearing serving she received.  She insist that she needs to go home and expressed her frustration of being in the hospital.  She continues to remain delusional about transitional housing being her house and not having any family members.  Provider tried to explain that APS has been reached out to look into her housing situation.  Patient is taking her medications with no reported side effect.  Patient is noted to be responding to stimuli on the unit.  Past Psychiatric History: see h&P Family History:  Family History  Problem Relation Age of Onset   Diabetes Mother    CAD Mother    Lung cancer Father    Social History:  Social History   Substance and Sexual Activity  Alcohol Use Yes   Comment: beer  occasionally     Social History   Substance and Sexual Activity  Drug Use No    Social History   Socioeconomic History   Marital status: Widowed    Spouse name: Not on file   Number of children: Not on file   Years of education: Not on file   Highest education level: Not on file  Occupational History   Not on file  Tobacco Use   Smoking status: Every Day    Current packs/day: 0.50    Average packs/day: 0.5 packs/day for 40.0 years (20.0 ttl pk-yrs)    Types: Cigarettes   Smokeless tobacco: Never  Vaping Use   Vaping status: Never Used  Substance and Sexual Activity   Alcohol use: Yes    Comment: beer occasionally   Drug use: No   Sexual activity: Not Currently    Birth control/protection: Post-menopausal  Other Topics Concern   Not on file  Social History Narrative   Not on file   Social Drivers of Health   Financial Resource Strain: Not on file  Food Insecurity: No Food Insecurity (10/17/2024)   Hunger Vital Sign    Worried About Running Out of Food in the Last Year: Never true    Ran Out of Food in the Last Year: Never true  Transportation Needs: No Transportation Needs (10/17/2024)   PRAPARE - Administrator, Civil Service (Medical): No    Lack of Transportation (Non-Medical): No  Recent Concern: Transportation Needs - Unmet Transportation Needs (10/14/2024)   PRAPARE - Transportation  Lack of Transportation (Medical): Yes    Lack of Transportation (Non-Medical): Yes  Physical Activity: Not on file  Stress: Not on file  Social Connections: Unknown (10/17/2024)   Social Connection and Isolation Panel    Frequency of Communication with Friends and Family: Patient unable to answer    Frequency of Social Gatherings with Friends and Family: Patient unable to answer    Attends Religious Services: Patient unable to answer    Active Member of Clubs or Organizations: Patient declined    Attends Banker Meetings: Patient unable to answer     Marital Status: Widowed   Past Medical History:  Past Medical History:  Diagnosis Date   Eye globe prosthesis    GERD (gastroesophageal reflux disease)    History of blood transfusion 1973   related to abscess burst in my stomach   Hyperlipemia    Hyperlipidemia    Hypertension    Osteoarthritis of back    Lowerback    SVD (spontaneous vaginal delivery)    x 1, baby died at 2 wks of age   Type II diabetes mellitus (HCC)     Past Surgical History:  Procedure Laterality Date   APPENDECTOMY     COLONOSCOPY     DILATATION & CURETTAGE/HYSTEROSCOPY WITH MYOSURE N/A 02/25/2015   Procedure: DILATATION & CURETTAGE/HYSTEROSCOPY WITH MYOSURE;  Surgeon: Truman Corona, MD;  Location: [facility];  Service: Gynecology;  Laterality: N/A;   DILATION AND CURETTAGE OF UTERUS     ENUCLEATION Right 03/16/2017   ENUCLEATION Right 03/16/2017   Procedure: ENUCLEATION RIGHT EYE;  Surgeon: Loyd Kathryne Palm, MD;  Location: [facility] OR;  Service: Ophthalmology;  Laterality: Right;   EYE SURGERY     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6, 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   EYE SURGERY Right ~ 1974   S/P initial eye injury; scissors stuck in my eye   LAPAROSCOPIC CHOLECYSTECTOMY     SHOULDER ARTHROSCOPY WITH ROTATOR CUFF REPAIR Left    wire stitches per patient   SHOULDER ARTHROSCOPY WITH ROTATOR CUFF REPAIR Right     Current Medications: Current Facility-Administered Medications  Medication Dose Route Frequency Provider Last Rate Last Admin   acetaminophen  (TYLENOL ) tablet 650 mg  650 mg Oral Q6H PRN Tex Drilling, NP   650 mg at 10/24/24 2133   alum & mag hydroxide-simeth (MAALOX/MYLANTA) 200-200-20 MG/5ML suspension 30 mL  30 mL Oral Q4H PRN Tex Drilling, NP       cloNIDine  (CATAPRES ) tablet 0.1 mg  0.1 mg Oral BID PRN Tex Drilling, NP       divalproex  (DEPAKOTE ) DR tablet 500 mg  500 mg Oral Q12H Zyshawn Bohnenkamp, MD   500 mg at 10/25/24 9062   losartan  (COZAAR ) tablet 25 mg  25 mg Oral Daily Nkwenti, Doris, NP   25 mg at  10/25/24 0937   magnesium  hydroxide (MILK OF MAGNESIA) suspension 30 mL  30 mL Oral Daily PRN Tex Drilling, NP       methimazole  (TAPAZOLE ) tablet 10 mg  10 mg Oral Daily Nkwenti, Doris, NP   10 mg at 10/25/24 9062   OLANZapine  (ZYPREXA ) injection 5 mg  5 mg Intramuscular TID PRN Tex Drilling, NP       OLANZapine  (ZYPREXA ) injection 5 mg  5 mg Intramuscular TID PRN Tex Drilling, NP       OLANZapine  (ZYPREXA ) tablet 15 mg  15 mg Oral QHS Cortlynn Hollinsworth, MD   15 mg at 10/24/24 2133   OLANZapine  zydis (ZYPREXA ) disintegrating  tablet 5 mg  5 mg Oral TID PRN Tex Drilling, NP       OLANZapine  zydis (ZYPREXA ) disintegrating tablet 5 mg  5 mg Oral TID PRN Tex Drilling, NP       pantoprazole  (PROTONIX ) EC tablet 40 mg  40 mg Oral Daily Nkwenti, Doris, NP   40 mg at 10/25/24 9062   traZODone  (DESYREL ) tablet 50 mg  50 mg Oral QHS PRN Tex Drilling, NP        Lab Results:  Results for orders placed or performed during the hospital encounter of 10/17/24 (from the past 48 hours)  Valproic acid  level     Status: Abnormal   Collection Time: 10/25/24  6:24 AM  Result Value Ref Range   Valproic Acid  Lvl 32 (L) 50 - 100 ug/mL    Comment: Performed at [facility], [address] [place], [place] [address]     Blood Alcohol level:  Lab Results  Component Value Date   [facility] <15 10/14/2024   ETH <10 07/02/2022    Metabolic Disorder Labs: Lab Results  Component Value Date   HGBA1C 5.5 10/14/2024   MPG 111.15 10/14/2024   MPG 137 01/06/2022   Lab Results  Component Value Date   PROLACTIN 5.8 10/14/2024   Lab Results  Component Value Date   CHOL 167 10/14/2024   TRIG 68 10/14/2024   HDL 60 10/14/2024   CHOLHDL 2.8 10/14/2024   VLDL 14 10/14/2024   LDLCALC 93 10/14/2024   LDLCALC 73 03/13/2024    Physical Findings: AIMS:  , ,  ,  ,    CIWA:    COWS:      Psychiatric Specialty Exam:  Presentation  General Appearance:  Appropriate for Environment  Eye  Contact: Fleeting  Speech: Normal Rate  Speech Volume: Decreased    Mood and Affect  Mood:fine  Affect: Flat   Thought Process  Thought Processes: Disorganized  Orientation:Partial  Thought Content:Illogical; Paranoid Ideation  Hallucinations: Denies  Ideas of Reference:Delusions; Paranoia  Suicidal Thoughts: Denies  Homicidal Thoughts: Denies   Sensorium  Memory: Immediate Fair; Recent Fair  Judgment: Impaired  Insight: Shallow   Executive Functions  Concentration: Fair  Attention Span: Fair  Recall: Poor  Fund of Knowledge: Fair  Language: Fair   Psychomotor Activity  Psychomotor Activity: No data recorded  Musculoskeletal: Strength & Muscle Tone: within normal limits Gait & Station: normal Assets  Assets: Manufacturing Systems Engineer; Desire for Improvement; Social Support    Physical Exam: Physical Exam Vitals and nursing note reviewed.    ROS Blood pressure 115/63, pulse 91, temperature 97.7 F (36.5 C), resp. rate 18, height 5' 8 (1.727 m), weight 48.3 kg, SpO2 100%. Body mass index is 16.19 kg/m.  Diagnosis: Principal Problem:   Schizophrenia, paranoid (HCC)    Treatment Plan Summary:  Safety and Monitoring:             -- Involuntary admission to inpatient psychiatric unit for safety, stabilization and treatment             -- Daily contact with patient to assess and evaluate symptoms and progress in treatment             -- Patient's case to be discussed in multi-disciplinary team meeting             -- Observation Level: q15 minute checks             -- Vital signs:  q12 hours             --  Precautions: suicide, elopement, and assault   2. Psychiatric Diagnoses and Treatment:           Increase dose pf   Depakote   750 mg twice daily-Depakote  level on 10/17/2024 is subtherapeutic-28- 10/25/24:recheck the Depakote  levels- 32 Increase Zyprexa  to 15 mg nightly     -- The risks/benefits/side-effects/alternatives  to this medication were discussed in detail with the patient and time was given for questions. The patient consents to medication trial.                -- Metabolic profile and EKG monitoring obtained while on an atypical antipsychotic (BMI: Lipid Panel: HbgA1c: QTc:)              -- Encouraged patient to participate in unit milieu and in scheduled group therapies  4. Discharge Planning:   -- Social work and case management to assist with discharge planning and identification of hospital follow-up needs prior to discharge  -- Estimated LOS: 3-4 days  Allyn Foil, MD 10/25/2024, 3:19 PM

## 2024-10-25 NOTE — Progress Notes (Addendum)
   10/24/24 2000  Psych Admission Type (Psych Patients Only)  Admission Status Involuntary  Psychosocial Assessment  Patient Complaints None  Eye Contact Fair  Facial Expression Flat  Affect Appropriate to circumstance  Speech Soft  Interaction Assertive  Motor Activity Slow  Appearance/Hygiene In scrubs  Behavior Characteristics Appropriate to situation  Mood Pleasant  Thought Process  Coherency Disorganized  Content Preoccupation  Delusions None reported or observed  Perception WDL  Hallucination None reported or observed  Judgment Impaired  Confusion None  Danger to Self  Current suicidal ideation? Denies  Agreement Not to Harm Self Yes  Description of Agreement verbal  Danger to Others  Danger to Others None reported or observed   Mood/Behavior:  Pleasant and cooperative. Preoccupied.   Psych assessment: Denies SI/HI and AVH.  Pt responding no different than how everyone else thinks.   Interaction / Group attendance:  Present in the milieu briefly the returns to her room. No interaction with peers. Minimal interaction with staff.     Medication/ PRNs: Compliant with scheduled medications. Required PRNs Tylenol  for pain and noted effective.   Pain: Shoulder pain   15 min checks in place for safety.

## 2024-10-25 NOTE — Group Note (Signed)
 Date:  10/25/2024 Time:  4:19 PM  Group Topic/Focus:  Overcoming Stress:   The focus of this group is to define stress and help patients assess their triggers.    Participation Level:  Active  Participation Quality:  Appropriate  Affect:  Appropriate  Cognitive:  Appropriate  Insight: Appropriate  Engagement in Group:  Engaged  Modes of Intervention:  Discussion   Shari [name] 10/25/2024, 4:19 PM

## 2024-10-26 DIAGNOSIS — F200 Paranoid schizophrenia: Secondary | ICD-10-CM | POA: Diagnosis not present

## 2024-10-26 NOTE — Plan of Care (Signed)
  Problem: Education: Goal: Emotional status will improve Outcome: Progressing   Problem: Activity: Goal: Sleeping patterns will improve Outcome: Progressing   Problem: Coping: Goal: Ability to demonstrate self-control will improve Outcome: Progressing   Problem: Education: Goal: Mental status will improve Outcome: Not Progressing

## 2024-10-26 NOTE — Group Note (Signed)
 Date:  10/26/2024 Time:  12:25 PM  Group Topic/Focus:  Making Healthy Choices:   The focus of this group is to help patients identify negative/unhealthy choices they were using prior to admission and identify positive/healthier coping strategies to replace them upon discharge.    Participation Level:  Minimal  Participation Quality:  Appropriate  Affect:  Appropriate  Cognitive:  Alert  Insight: Appropriate  Engagement in Group:  Limited  Modes of Intervention:  Activity and Discussion  Additional Comments:     Maglione,Riot Barrick E 10/26/2024, 12:25 PM

## 2024-10-26 NOTE — Progress Notes (Signed)
 [facility] MD Progress Note  10/26/2024 1:03 PM Shari [name]  MRN:  [id number] Patient is a 68 year old female with a history of Schizoaffective Disorder, Bipolar Type who presents via [employer] under IVC, initiated by CM, Falencio with re-entry program, to [facility] for assessment. Per IVC, [name] has been diagnosed with Schizophrenia and is refusing to take medication. [name] isn't sleeping. Residents report her screaming and yelling all night long.  [name] responding to internal stimuli.  [name] verbally aggressive.  The respondent has locked all of the residents inside of the transitional housing home.  They could not leave the home which prompted the IVC.  Residents did not have access to food, the restroom or anything as a result of being locked in.  [name] is currently under Adult protective services care. Patient is admitted to [facility] unit with Q15 min safety monitoring. Multidisciplinary team approach is offered. Medication management; group/milieu therapy is offered.   Subjective:  Chart reviewed, case discussed in multidisciplinary meeting, patient seen during rounds.  Patient is noted to be resting in her room. Patient continues to be confused about transitional housing and had family not being there she continues to be upset about being here somewhat irritable at times no PRNs given.  Past Psychiatric History: see h&P Family History:  Family History  Problem Relation Age of Onset   Diabetes Mother    CAD Mother    Lung cancer Father    Social History:  Social History   Substance and Sexual Activity  Alcohol Use Yes   Comment: beer occasionally     Social History   Substance and Sexual Activity  Drug Use No    Social History   Socioeconomic History   Marital status: Widowed    Spouse name: Not on file   Number of children: Not on file   Years of education: Not on file   Highest education level: Not on file  Occupational History   Not on file   Tobacco Use   Smoking status: Every Day    Current packs/day: 0.50    Average packs/day: 0.5 packs/day for 40.0 years (20.0 ttl pk-yrs)    Types: Cigarettes   Smokeless tobacco: Never  Vaping Use   Vaping status: Never Used  Substance and Sexual Activity   Alcohol use: Yes    Comment: beer occasionally   Drug use: No   Sexual activity: Not Currently    Birth control/protection: Post-menopausal  Other Topics Concern   Not on file  Social History Narrative   Not on file   Social Drivers of Health   Financial Resource Strain: Not on file  Food Insecurity: No Food Insecurity (10/17/2024)   Hunger Vital Sign    Worried About Running Out of Food in the Last Year: Never true    Ran Out of Food in the Last Year: Never true  Transportation Needs: No Transportation Needs (10/17/2024)   PRAPARE - Administrator, Civil Service (Medical): No    Lack of Transportation (Non-Medical): No  Recent Concern: Transportation Needs - Unmet Transportation Needs (10/14/2024)   PRAPARE - Administrator, Civil Service (Medical): Yes    Lack of Transportation (Non-Medical): Yes  Physical Activity: Not on file  Stress: Not on file  Social Connections: Unknown (10/17/2024)   Social Connection and Isolation Panel    Frequency of Communication with Friends and Family: Patient unable to answer    Frequency of Social Gatherings  with Friends and Family: Patient unable to answer    Attends Religious Services: Patient unable to answer    Active Member of Clubs or Organizations: Patient declined    Attends Banker Meetings: Patient unable to answer    Marital Status: Widowed   Past Medical History:  Past Medical History:  Diagnosis Date   Eye globe prosthesis    GERD (gastroesophageal reflux disease)    History of blood transfusion 1973   related to abscess burst in my stomach   Hyperlipemia    Hyperlipidemia    Hypertension    Osteoarthritis of back     Lowerback    SVD (spontaneous vaginal delivery)    x 1, baby died at 2 wks of age   Type II diabetes mellitus (HCC)     Past Surgical History:  Procedure Laterality Date   APPENDECTOMY     COLONOSCOPY     DILATATION & CURETTAGE/HYSTEROSCOPY WITH MYOSURE N/A 02/25/2015   Procedure: DILATATION & CURETTAGE/HYSTEROSCOPY WITH MYOSURE;  Surgeon: Truman Corona, MD;  Location: [facility];  Service: Gynecology;  Laterality: N/A;   DILATION AND CURETTAGE OF UTERUS     ENUCLEATION Right 03/16/2017   ENUCLEATION Right 03/16/2017   Procedure: ENUCLEATION RIGHT EYE;  Surgeon: Loyd Kathryne Palm, MD;  Location: [facility] OR;  Service: Ophthalmology;  Laterality: Right;   EYE SURGERY     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6, 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   EYE SURGERY Right ~ 1974   S/P initial eye injury; scissors stuck in my eye   LAPAROSCOPIC CHOLECYSTECTOMY     SHOULDER ARTHROSCOPY WITH ROTATOR CUFF REPAIR Left    wire stitches per patient   SHOULDER ARTHROSCOPY WITH ROTATOR CUFF REPAIR Right     Current Medications: Current Facility-Administered Medications  Medication Dose Route Frequency Provider Last Rate Last Admin   acetaminophen  (TYLENOL ) tablet 650 mg  650 mg Oral Q6H PRN Tex Drilling, NP   650 mg at 10/25/24 2113   alum & mag hydroxide-simeth (MAALOX/MYLANTA) 200-200-20 MG/5ML suspension 30 mL  30 mL Oral Q4H PRN Tex Drilling, NP       cloNIDine  (CATAPRES ) tablet 0.1 mg  0.1 mg Oral BID PRN Tex Drilling, NP       divalproex  (DEPAKOTE ) DR tablet 750 mg  750 mg Oral Q12H Jadapalle, Sree, MD   750 mg at 10/26/24 1016   losartan  (COZAAR ) tablet 25 mg  25 mg Oral Daily Nkwenti, Doris, NP   25 mg at 10/26/24 1016   magnesium  hydroxide (MILK OF MAGNESIA) suspension 30 mL  30 mL Oral Daily PRN Tex Drilling, NP       methimazole  (TAPAZOLE ) tablet 10 mg  10 mg Oral Daily Nkwenti, Doris, NP   10 mg at 10/26/24 1016   OLANZapine  (ZYPREXA ) injection 5 mg  5 mg Intramuscular TID PRN Tex Drilling, NP       OLANZapine   (ZYPREXA ) injection 5 mg  5 mg Intramuscular TID PRN Tex Drilling, NP       OLANZapine  (ZYPREXA ) tablet 15 mg  15 mg Oral QHS Jadapalle, Sree, MD   15 mg at 10/25/24 2108   OLANZapine  zydis (ZYPREXA ) disintegrating tablet 5 mg  5 mg Oral TID PRN Tex Drilling, NP       OLANZapine  zydis (ZYPREXA ) disintegrating tablet 5 mg  5 mg Oral TID PRN Tex Drilling, NP       pantoprazole  (PROTONIX ) EC tablet 40 mg  40 mg Oral Daily Nkwenti, Doris, NP   40  mg at 10/26/24 1016   traZODone  (DESYREL ) tablet 50 mg  50 mg Oral QHS PRN Tex Drilling, NP        Lab Results:  Results for orders placed or performed during the hospital encounter of 10/17/24 (from the past 48 hours)  Valproic acid  level     Status: Abnormal   Collection Time: 10/25/24  6:24 AM  Result Value Ref Range   Valproic Acid  Lvl 32 (L) 50 - 100 ug/mL    Comment: Performed at [facility], [address] [place], [place] [address]     Blood Alcohol level:  Lab Results  Component Value Date   [facility] <15 10/14/2024   ETH <10 07/02/2022    Metabolic Disorder Labs: Lab Results  Component Value Date   HGBA1C 5.5 10/14/2024   MPG 111.15 10/14/2024   MPG 137 01/06/2022   Lab Results  Component Value Date   PROLACTIN 5.8 10/14/2024   Lab Results  Component Value Date   CHOL 167 10/14/2024   TRIG 68 10/14/2024   HDL 60 10/14/2024   CHOLHDL 2.8 10/14/2024   VLDL 14 10/14/2024   LDLCALC 93 10/14/2024   LDLCALC 73 03/13/2024    Physical Findings: AIMS:  , ,  ,  ,    CIWA:    COWS:      Psychiatric Specialty Exam:  Presentation  General Appearance:  Appropriate for Environment  Eye Contact: Fleeting  Speech: Normal Rate  Speech Volume: Decreased    Mood and Affect  Mood:fine  Affect: Flat   Thought Process  Thought Processes: Disorganized  Orientation:Partial  Thought Content:Illogical; Paranoid Ideation  Hallucinations: Denies  Ideas of Reference:Delusions; Paranoia  Suicidal  Thoughts: Denies  Homicidal Thoughts: Denies   Sensorium  Memory: Immediate Fair; Recent Fair  Judgment: Impaired  Insight: Shallow   Executive Functions  Concentration: Fair  Attention Span: Fair  Recall: Poor  Fund of Knowledge: Fair  Language: Fair   Psychomotor Activity  Psychomotor Activity: No data recorded  Musculoskeletal: Strength & Muscle Tone: within normal limits Gait & Station: normal Assets  Assets: Manufacturing Systems Engineer; Desire for Improvement; Social Support    Physical Exam: Physical Exam Vitals and nursing note reviewed.    ROS Blood pressure 111/72, pulse 93, temperature 97.9 F (36.6 C), resp. rate 17, height 5' 8 (1.727 m), weight 48.3 kg, SpO2 98%. Body mass index is 16.19 kg/m.  Diagnosis: Principal Problem:   Schizophrenia, paranoid (HCC)    Treatment Plan Summary:  Safety and Monitoring:             -- Involuntary admission to inpatient psychiatric unit for safety, stabilization and treatment             -- Daily contact with patient to assess and evaluate symptoms and progress in treatment             -- Patient's case to be discussed in multi-disciplinary team meeting             -- Observation Level: q15 minute checks             -- Vital signs:  q12 hours             -- Precautions: suicide, elopement, and assault   2. Psychiatric Diagnoses and Treatment:           Increase dose pf   Depakote   750 mg twice daily-Depakote  level on 10/17/2024 is subtherapeutic-28- 10/25/24:recheck the Depakote  levels- 32 Increase Zyprexa  to 15 mg  nightly     -- The risks/benefits/side-effects/alternatives to this medication were discussed in detail with the patient and time was given for questions. The patient consents to medication trial.                -- Metabolic profile and EKG monitoring obtained while on an atypical antipsychotic (BMI: Lipid Panel: HbgA1c: QTc:)              -- Encouraged patient to participate in unit  milieu and in scheduled group therapies  4. Discharge Planning:   -- Social work and case management to assist with discharge planning and identification of hospital follow-up needs prior to discharge  -- Estimated LOS: 3-4 days  Millie JONELLE Manners, MD 10/26/2024, 1:03 PM

## 2024-10-26 NOTE — Group Note (Signed)
 Date:  10/26/2024 Time:  4:16 PM  Group Topic/Focus:  Emotional Education:   The focus of this group is to discuss what feelings/emotions are, and how they are experienced.    Participation Level:  Active  Participation Quality:  Appropriate  Affect:  Appropriate  Cognitive:  Appropriate  Insight: Appropriate  Engagement in Group:  Engaged  Modes of Intervention:  Discussion   [name] 10/26/2024, 4:16 PM

## 2024-10-26 NOTE — Progress Notes (Signed)
 Pt reports R. Shoulder pain rated 8/10. PRN Tylenol  650mg  given as per order, which was effective. She denies SI/HI/AVH. [name] med compliant and safe on the unit at this time with Q15 min safety checks in place.

## 2024-10-26 NOTE — Plan of Care (Signed)
   Problem: Education: Goal: Emotional status will improve Outcome: Progressing Goal: Mental status will improve Outcome: Progressing Goal: Verbalization of understanding the information provided will improve Outcome: Progressing

## 2024-10-26 NOTE — Progress Notes (Signed)
 Behavior: Patient responsive during interaction. Pleasant and calm.    Psych assessment:  Patient denies SI/HI/AVH. Observed talking to herself.  Interaction / Group attendance:   Patient attended group activities today. Observed outside for short period of time with peers. Medication/ PRNs:  Medications as prescribed. No PRNs this shift  Pain:  No complaints of pain this shift.   15 min checks in place for safety.   Ongoing monitoring continues.

## 2024-10-27 DIAGNOSIS — F200 Paranoid schizophrenia: Secondary | ICD-10-CM | POA: Diagnosis not present

## 2024-10-27 NOTE — Progress Notes (Signed)
 Pt reports R. Shoulder pain rated 7/10. PRN Tylenol  650mg  given as per order, which was effective. She denies SI/HI/AVH. [name] med compliant and safe on the unit at this time with Q15 min safety checks in place.

## 2024-10-27 NOTE — Group Note (Signed)
 Date:  10/27/2024 Time:  8:56 PM  Group Topic/Focus:  Wrap-Up Group:   The focus of this group is to help patients review their daily goal of treatment and discuss progress on daily workbooks.    Participation Level:  Active  Participation Quality:  Appropriate  Affect:  Appropriate  Cognitive:  Alert  Insight: Appropriate  Engagement in Group:  Engaged  Modes of Intervention:  Discussion  Additional Comments:    Shari [name] 10/27/2024, 8:56 PM

## 2024-10-27 NOTE — Plan of Care (Signed)
   Problem: Education: Goal: Knowledge of [facility] Education information/materials will improve Outcome: Progressing Goal: Emotional status will improve Outcome: Progressing Goal: Mental status will improve Outcome: Progressing

## 2024-10-27 NOTE — Group Note (Unsigned)
 [facility] LCSW Group Therapy Note    Group Date: 10/27/2024 Start Time: 1000 End Time: 1030  Type of Therapy and Topic:  Group Therapy:  Overcoming Obstacles  Participation Level:  {BHH PARTICIPATION LEVEL:22264:::1}  Mood:  Description of Group:   In this group patients will be encouraged to explore what they see as obstacles to their own wellness and recovery. They will be guided to discuss their thoughts, feelings, and behaviors related to these obstacles. The group will process together ways to cope with barriers, with attention given to specific choices patients can make. Each patient will be challenged to identify changes they are motivated to make in order to overcome their obstacles. This group will be process-oriented, with patients participating in exploration of their own experiences as well as giving and receiving support and challenge from other group members.  Therapeutic Goals: 1. Patient will identify personal and current obstacles as they relate to admission. 2. Patient will identify barriers that currently interfere with their wellness or overcoming obstacles.  3. Patient will identify feelings, thought process and behaviors related to these barriers. 4. Patient will identify two changes they are willing to make to overcome these obstacles:    Summary of Patient Progress   ***   Therapeutic Modalities:   Cognitive Behavioral Therapy Solution Focused Therapy Motivational Interviewing Relapse Prevention Therapy   [name], LCSWA

## 2024-10-27 NOTE — Progress Notes (Signed)
   10/27/24 1100  Psych Admission Type (Psych Patients Only)  Admission Status Involuntary  Psychosocial Assessment  Patient Complaints None  Eye Contact Brief  Facial Expression Flat  Affect Appropriate to circumstance  Speech Soft  Interaction Minimal  Motor Activity Slow  Appearance/Hygiene Unremarkable  Behavior Characteristics Cooperative  Mood Pleasant  Thought Process  Coherency Circumstantial  Content Blaming others  Delusions None reported or observed  Perception WDL  Hallucination None reported or observed  Judgment Impaired  Confusion None  Danger to Self  Current suicidal ideation? Denies  Agreement Not to Harm Self Yes  Description of Agreement verbal  Danger to Others  Danger to Others None reported or observed

## 2024-10-27 NOTE — Plan of Care (Signed)

## 2024-10-27 NOTE — Group Note (Signed)
 Date:  10/27/2024 Time:  4:08 PM  Group Topic/Focus:  Managing Feelings:   The focus of this group is to identify what feelings patients have difficulty handling and develop a plan to handle them in a healthier way upon discharge.    Participation Level:  Active  Participation Quality:  Appropriate  Affect:  Appropriate  Cognitive:  Appropriate  Insight: Appropriate  Engagement in Group:  Engaged  Modes of Intervention:  Discussion   [name] 10/27/2024, 4:08 PM

## 2024-10-27 NOTE — Group Note (Signed)
 [facility] LCSW Group Therapy Note    Group Date: 10/27/2024 Start Time: 1000 End Time: 1030  Type of Therapy and Topic:  Group Therapy:  Overcoming Obstacles  Participation Level:  [facility] PARTICIPATION LEVEL: Did Not Attend  Mood:  Description of Group:   In this group patients will be encouraged to explore what they see as obstacles to their own wellness and recovery. They will be guided to discuss their thoughts, feelings, and behaviors related to these obstacles. The group will process together ways to cope with barriers, with attention given to specific choices patients can make. Each patient will be challenged to identify changes they are motivated to make in order to overcome their obstacles. This group will be process-oriented, with patients participating in exploration of their own experiences as well as giving and receiving support and challenge from other group members.  Therapeutic Goals: 1. Patient will identify personal and current obstacles as they relate to admission. 2. Patient will identify barriers that currently interfere with their wellness or overcoming obstacles.  3. Patient will identify feelings, thought process and behaviors related to these barriers. 4. Patient will identify two changes they are willing to make to overcome these obstacles:    Summary of Patient Progress Pt did not participate in group     Therapeutic Modalities:   Cognitive Behavioral Therapy Solution Focused Therapy Motivational Interviewing Relapse Prevention Therapy   [name], LCSWA

## 2024-10-28 DIAGNOSIS — F200 Paranoid schizophrenia: Secondary | ICD-10-CM | POA: Diagnosis not present

## 2024-10-28 MED ORDER — OLANZAPINE 10 MG PO TABS
20.0000 mg | ORAL_TABLET | Freq: Every day | ORAL | Status: AC
  Administered 2024-10-28 – 2025-01-10 (×75): 20 mg via ORAL
  Filled 2024-10-28: qty 2
  Filled 2024-10-28: qty 4
  Filled 2024-10-28 (×6): qty 2
  Filled 2024-10-28: qty 4
  Filled 2024-10-28 (×5): qty 2
  Filled 2024-10-28 (×2): qty 4
  Filled 2024-10-28: qty 2
  Filled 2024-10-28: qty 4
  Filled 2024-10-28 (×2): qty 2
  Filled 2024-10-28 (×3): qty 4
  Filled 2024-10-28: qty 2
  Filled 2024-10-28: qty 4
  Filled 2024-10-28: qty 2
  Filled 2024-10-28: qty 4
  Filled 2024-10-28 (×5): qty 2
  Filled 2024-10-28 (×4): qty 4
  Filled 2024-10-28: qty 2
  Filled 2024-10-28: qty 4
  Filled 2024-10-28 (×4): qty 2
  Filled 2024-10-28: qty 4
  Filled 2024-10-28 (×2): qty 2
  Filled 2024-10-28: qty 4
  Filled 2024-10-28: qty 2
  Filled 2024-10-28: qty 4
  Filled 2024-10-28 (×3): qty 2
  Filled 2024-10-28 (×2): qty 4
  Filled 2024-10-28 (×3): qty 2
  Filled 2024-10-28 (×2): qty 4
  Filled 2024-10-28: qty 2
  Filled 2024-10-28 (×3): qty 4
  Filled 2024-10-28: qty 2
  Filled 2024-10-28: qty 4
  Filled 2024-10-28 (×9): qty 2
  Filled 2024-10-28 (×2): qty 4
  Filled 2024-10-28 (×11): qty 2
  Filled 2024-10-28 (×2): qty 4
  Filled 2024-10-28 (×2): qty 2
  Filled 2024-10-28: qty 4
  Filled 2024-10-28 (×2): qty 2
  Filled 2024-10-28: qty 4
  Filled 2024-10-28: qty 2

## 2024-10-28 NOTE — B[facility] Counselor (Signed)
 CSW contacted [name], DSS caseworker, [phone].   Arch visited patient on 10/25/24  following  CSW  submitting APS report and it being screened in.   According to [place], the plan is currently to pursue guardianship of pt, and she has emailed her supervisor regarding it.   Arch asks what kind of placement pt might be appropriate for. CSW informed Arch it would most likely be an [facility] or a Group Home.   Arch reports she will inform CSW once she knows whether or not DSS will file for guardianship.   Lum Croft, MSW, [place] 10/28/2024 1:36 PM

## 2024-10-28 NOTE — Progress Notes (Signed)
   10/28/24 1100  Psych Admission Type (Psych Patients Only)  Admission Status Involuntary  Psychosocial Assessment  Patient Complaints None  Eye Contact Brief  Facial Expression Flat  Affect Flat  Speech Soft  Interaction Minimal  Motor Activity Slow  Appearance/Hygiene Unremarkable  Behavior Characteristics Cooperative  Mood Pleasant  Thought Process  Coherency Circumstantial  Content Ambivalence  Delusions None reported or observed  Perception WDL  Hallucination None reported or observed  Judgment Impaired  Confusion Mild  Danger to Self  Current suicidal ideation? Denies  Agreement Not to Harm Self Yes  Description of Agreement verbal  Danger to Others  Danger to Others None reported or observed

## 2024-10-28 NOTE — Progress Notes (Signed)
 [facility] MD Progress Note  10/28/2024 12:52 PM Shari [name]  MRN:  [id number] Patient is a 68 year old female with a history of Schizoaffective Disorder, Bipolar Type who presents via [employer] under IVC, initiated by CM, Falencio with re-entry program, to [facility] for assessment. Per IVC, [name] has been diagnosed with Schizophrenia and is refusing to take medication. [name] isn't sleeping. Residents report her screaming and yelling all night long.  [name] responding to internal stimuli.  [name] verbally aggressive.  The respondent has locked all of the residents inside of the transitional housing home.  They could not leave the home which prompted the IVC.  Residents did not have access to food, the restroom or anything as a result of being locked in.  [name] is currently under Adult protective services care. Patient is admitted to [facility] unit with Q15 min safety monitoring. Multidisciplinary team approach is offered. Medication management; group/milieu therapy is offered.   Subjective:  Chart reviewed, case discussed in multidisciplinary meeting, patient seen during rounds.  Patient is noted to be sitting in the day area.  She offers no complaints.  She continues to remain delusional claiming that the transitional housing is her house.  She reports that she can take care of herself and tries to clarify that Mr. Debby from [employer] is not a retail buyer.  She did acknowledge that the APS case worker came to see her but minimizes the conversation stating there was nothing that was discussed and she is ready to go home.  When asked about her medications she reports not needing any psychotropic medications.  Nursing reports patient is taking her medications with no reported side effects. Past Psychiatric History: see h&P Family History:  Family History  Problem Relation Age of Onset   Diabetes Mother    CAD Mother    Lung cancer Father    Social History:   Social History   Substance and Sexual Activity  Alcohol Use Yes   Comment: beer occasionally     Social History   Substance and Sexual Activity  Drug Use No    Social History   Socioeconomic History   Marital status: Widowed    Spouse name: Not on file   Number of children: Not on file   Years of education: Not on file   Highest education level: Not on file  Occupational History   Not on file  Tobacco Use   Smoking status: Every Day    Current packs/day: 0.50    Average packs/day: 0.5 packs/day for 40.0 years (20.0 ttl pk-yrs)    Types: Cigarettes   Smokeless tobacco: Never  Vaping Use   Vaping status: Never Used  Substance and Sexual Activity   Alcohol use: Yes    Comment: beer occasionally   Drug use: No   Sexual activity: Not Currently    Birth control/protection: Post-menopausal  Other Topics Concern   Not on file  Social History Narrative   Not on file   Social Drivers of Health   Financial Resource Strain: Not on file  Food Insecurity: No Food Insecurity (10/17/2024)   Hunger Vital Sign    Worried About Running Out of Food in the Last Year: Never true    Ran Out of Food in the Last Year: Never true  Transportation Needs: No Transportation Needs (10/17/2024)   PRAPARE - Administrator, Civil Service (Medical): No    Lack of Transportation (Non-Medical): No  Recent  Concern: Transportation Needs - Unmet Transportation Needs (10/14/2024)   PRAPARE - Administrator, Civil Service (Medical): Yes    Lack of Transportation (Non-Medical): Yes  Physical Activity: Not on file  Stress: Not on file  Social Connections: Unknown (10/17/2024)   Social Connection and Isolation Panel    Frequency of Communication with Friends and Family: Patient unable to answer    Frequency of Social Gatherings with Friends and Family: Patient unable to answer    Attends Religious Services: Patient unable to answer    Active Member of Clubs or Organizations:  Patient declined    Attends Banker Meetings: Patient unable to answer    Marital Status: Widowed   Past Medical History:  Past Medical History:  Diagnosis Date   Eye globe prosthesis    GERD (gastroesophageal reflux disease)    History of blood transfusion 1973   related to abscess burst in my stomach   Hyperlipemia    Hyperlipidemia    Hypertension    Osteoarthritis of back    Lowerback    SVD (spontaneous vaginal delivery)    x 1, baby died at 2 wks of age   Type II diabetes mellitus (HCC)     Past Surgical History:  Procedure Laterality Date   APPENDECTOMY     COLONOSCOPY     DILATATION & CURETTAGE/HYSTEROSCOPY WITH MYOSURE N/A 02/25/2015   Procedure: DILATATION & CURETTAGE/HYSTEROSCOPY WITH MYOSURE;  Surgeon: Truman Corona, MD;  Location: [facility];  Service: Gynecology;  Laterality: N/A;   DILATION AND CURETTAGE OF UTERUS     ENUCLEATION Right 03/16/2017   ENUCLEATION Right 03/16/2017   Procedure: ENUCLEATION RIGHT EYE;  Surgeon: Loyd Kathryne Palm, MD;  Location: [facility] OR;  Service: Ophthalmology;  Laterality: Right;   EYE SURGERY     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6, 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   EYE SURGERY Right ~ 1974   S/P initial eye injury; scissors stuck in my eye   LAPAROSCOPIC CHOLECYSTECTOMY     SHOULDER ARTHROSCOPY WITH ROTATOR CUFF REPAIR Left    wire stitches per patient   SHOULDER ARTHROSCOPY WITH ROTATOR CUFF REPAIR Right     Current Medications: Current Facility-Administered Medications  Medication Dose Route Frequency Provider Last Rate Last Admin   acetaminophen  (TYLENOL ) tablet 650 mg  650 mg Oral Q6H PRN Tex Drilling, NP   650 mg at 10/27/24 2214   alum & mag hydroxide-simeth (MAALOX/MYLANTA) 200-200-20 MG/5ML suspension 30 mL  30 mL Oral Q4H PRN Tex Drilling, NP       cloNIDine  (CATAPRES ) tablet 0.1 mg  0.1 mg Oral BID PRN Tex Drilling, NP       divalproex  (DEPAKOTE ) DR tablet 750 mg  750 mg Oral Q12H Kenzi Bardwell, MD   750 mg at 10/28/24 9043    losartan  (COZAAR ) tablet 25 mg  25 mg Oral Daily Nkwenti, Doris, NP   25 mg at 10/28/24 9043   magnesium  hydroxide (MILK OF MAGNESIA) suspension 30 mL  30 mL Oral Daily PRN Tex Drilling, NP       methimazole  (TAPAZOLE ) tablet 10 mg  10 mg Oral Daily Nkwenti, Doris, NP   10 mg at 10/28/24 0957   OLANZapine  (ZYPREXA ) injection 5 mg  5 mg Intramuscular TID PRN Tex Drilling, NP       OLANZapine  (ZYPREXA ) injection 5 mg  5 mg Intramuscular TID PRN Tex Drilling, NP       OLANZapine  (ZYPREXA ) tablet 15 mg  15 mg Oral QHS  Noele Icenhour, MD   15 mg at 10/27/24 2158   OLANZapine  zydis (ZYPREXA ) disintegrating tablet 5 mg  5 mg Oral TID PRN Tex Drilling, NP       OLANZapine  zydis (ZYPREXA ) disintegrating tablet 5 mg  5 mg Oral TID PRN Tex Drilling, NP       pantoprazole  (PROTONIX ) EC tablet 40 mg  40 mg Oral Daily Nkwenti, Doris, NP   40 mg at 10/28/24 9043   traZODone  (DESYREL ) tablet 50 mg  50 mg Oral QHS PRN Tex Drilling, NP   50 mg at 10/27/24 2159    Lab Results:  No results found for this or any previous visit (from the past 48 hours).    Blood Alcohol level:  Lab Results  Component Value Date   [facility] <15 10/14/2024   ETH <10 07/02/2022    Metabolic Disorder Labs: Lab Results  Component Value Date   HGBA1C 5.5 10/14/2024   MPG 111.15 10/14/2024   MPG 137 01/06/2022   Lab Results  Component Value Date   PROLACTIN 5.8 10/14/2024   Lab Results  Component Value Date   CHOL 167 10/14/2024   TRIG 68 10/14/2024   HDL 60 10/14/2024   CHOLHDL 2.8 10/14/2024   VLDL 14 10/14/2024   LDLCALC 93 10/14/2024   LDLCALC 73 03/13/2024    Physical Findings: AIMS:  , ,  ,  ,    CIWA:    COWS:      Psychiatric Specialty Exam:  Presentation  General Appearance:  Appropriate for Environment  Eye Contact: Fleeting  Speech: Normal Rate  Speech Volume: Decreased    Mood and Affect  Mood:fine  Affect: Flat   Thought Process  Thought  Processes: Disorganized  Orientation:Partial  Thought Content:Illogical; Paranoid Ideation  Hallucinations: Denies  Ideas of Reference:Delusions; Paranoia  Suicidal Thoughts: Denies  Homicidal Thoughts: Denies   Sensorium  Memory: Immediate Fair; Recent Fair  Judgment: Impaired  Insight: Shallow   Executive Functions  Concentration: Fair  Attention Span: Fair  Recall: Poor  Fund of Knowledge: Fair  Language: Fair   Psychomotor Activity  Psychomotor Activity: No data recorded  Musculoskeletal: Strength & Muscle Tone: within normal limits Gait & Station: normal Assets  Assets: Manufacturing Systems Engineer; Desire for Improvement; Social Support    Physical Exam: Physical Exam Vitals and nursing note reviewed.    ROS Blood pressure (!) 120/59, pulse 80, temperature 98.3 F (36.8 C), resp. rate 17, height 5' 8 (1.727 m), weight 48.3 kg, SpO2 97%. Body mass index is 16.19 kg/m.  Diagnosis: Principal Problem:   Schizophrenia, paranoid (HCC)    Treatment Plan Summary:  Safety and Monitoring:             -- Involuntary admission to inpatient psychiatric unit for safety, stabilization and treatment             -- Daily contact with patient to assess and evaluate symptoms and progress in treatment             -- Patient's case to be discussed in multi-disciplinary team meeting             -- Observation Level: q15 minute checks             -- Vital signs:  q12 hours             -- Precautions: suicide, elopement, and assault   2. Psychiatric Diagnoses and Treatment:           Increase dose pf  Depakote   750 mg twice daily-Depakote  level on 10/17/2024 is subtherapeutic-28- 10/25/24:recheck the Depakote  levels- 32 Increase Zyprexa  to 15 mg nightly     -- The risks/benefits/side-effects/alternatives to this medication were discussed in detail with the patient and time was given for questions. The patient consents to medication trial.                 -- Metabolic profile and EKG monitoring obtained while on an atypical antipsychotic (BMI: Lipid Panel: HbgA1c: QTc:)              -- Encouraged patient to participate in unit milieu and in scheduled group therapies  4. Discharge Planning:   -- Social work and case management to assist with discharge planning and identification of hospital follow-up needs prior to discharge  -- Estimated LOS: 3-4 days  Allyn Foil, MD 10/28/2024, 12:52 PM

## 2024-10-28 NOTE — Group Note (Signed)
 Date:  10/28/2024 Time:  10:39 PM  Group Topic/Focus:  Wrap-Up Group:   The focus of this group is to help patients review their daily goal of treatment and discuss progress on daily workbooks.    Participation Level:  Active  Participation Quality:  Appropriate  Affect:  Appropriate  Cognitive:  Appropriate  Insight: Appropriate  Engagement in Group:  Engaged  Modes of Intervention:  Discussion  Additional Comments:    Shari [name] 10/28/2024, 10:39 PM

## 2024-10-28 NOTE — Progress Notes (Signed)
 11/ 24/2025 Letter to the Court Reg: Shari [name] DOB: [birth year]  Shari [name] has been hospitalized since 10/17/2024 at [facility]) inpatient psychiatric unit. [name] currently being treated for Schizophrenia and Major Neurocognitive disorder (Dementia) of rapid decline in functioning. Patient is  very delusional and thinks transitional house is her house and locked up the other residents saying they are prostituting an doing drugs. Other residents were rescued by cops and APS case workers brought patient to the ED and patient was admitted to the inpatient psychiatric unit. Patient is responding to medications very slowly and remains delusional. She lost touch with reality as she continues to state the people in the house are invading her house and doing drugs. Patient is unable to care for self. APS is fling for guardianship. As this process is time consuming patient has no safe place to go with out supervision.   We appreciate the court's assistance in stabilizing and currently we request another 30 days of inpatient care to further stabilize.  Sincerely, Tiegan Terpstra.MD [facility]

## 2024-10-28 NOTE — Plan of Care (Signed)
   Problem: Education: Goal: Emotional status will improve Outcome: Progressing Goal: Mental status will improve Outcome: Progressing   Problem: Activity: Goal: Interest or engagement in activities will improve Outcome: Progressing

## 2024-10-28 NOTE — Plan of Care (Signed)
   Problem: Education: Goal: Emotional status will improve Outcome: Progressing Goal: Mental status will improve Outcome: Progressing

## 2024-10-28 NOTE — Group Note (Signed)
 Date:  10/28/2024 Time:  5:34 PM  Group Topic/Focus:  Managing Feelings:   The focus of this group is to identify what feelings patients have difficulty handling and develop a plan to handle them in a healthier way upon discharge.    Participation Level:  Active  Participation Quality:  Appropriate  Affect:  Appropriate and Flat  Cognitive:  Appropriate  Insight: Appropriate  Engagement in Group:  Improving  Modes of Intervention:  Discussion  Additional Comments:    Garen CINDERELLA Daring 10/28/2024, 5:34 PM

## 2024-10-28 NOTE — Group Note (Signed)
 Date:  10/28/2024 Time:  10:51 AM  Group Topic/Focus:  Goals/Gratitude Group:   The focus of this group is to help patients establish daily goals to achieve during treatment and discuss how the patient can incorporate goal setting into their daily lives to aide in recovery. Also patients watched a video on gratitude and shared their thoughts on the video.    Participation Level:  Did Not Attend  Shari [name] 10/28/2024, 10:51 AM

## 2024-10-28 NOTE — Progress Notes (Signed)
   10/28/24 2000  Psych Admission Type (Psych Patients Only)  Admission Status Involuntary  Psychosocial Assessment  Patient Complaints None  Eye Contact Brief  Facial Expression Flat  Affect Flat  Speech Soft  Interaction Minimal  Motor Activity Slow  Appearance/Hygiene Unremarkable  Behavior Characteristics Cooperative;Appropriate to situation  Mood Pleasant  Thought Process  Coherency Circumstantial  Content Ambivalence  Delusions None reported or observed  Perception WDL  Hallucination None reported or observed  Judgment Impaired  Confusion Mild  Danger to Self  Current suicidal ideation? Denies  Agreement Not to Harm Self Yes  Description of Agreement verbal  Danger to Others  Danger to Others None reported or observed

## 2024-10-28 NOTE — BH IP Treatment Plan (Signed)
 Interdisciplinary Treatment and Diagnostic Plan Update  10/28/2024 Time of Session: 9:00 AM Shari [name] MRN: [id number]  Principal Diagnosis: Schizophrenia, paranoid (HCC)  Secondary Diagnoses: Principal Problem:   Schizophrenia, paranoid (HCC)   Current Medications:  Current Facility-Administered Medications  Medication Dose Route Frequency Provider Last Rate Last Admin   acetaminophen  (TYLENOL ) tablet 650 mg  650 mg Oral Q6H PRN Tex Drilling, NP   650 mg at 10/27/24 2214   alum & mag hydroxide-simeth (MAALOX/MYLANTA) 200-200-20 MG/5ML suspension 30 mL  30 mL Oral Q4H PRN Tex Drilling, NP       cloNIDine  (CATAPRES ) tablet 0.1 mg  0.1 mg Oral BID PRN Tex Drilling, NP       divalproex  (DEPAKOTE ) DR tablet 750 mg  750 mg Oral Q12H Jadapalle, Sree, MD   750 mg at 10/28/24 9043   losartan  (COZAAR ) tablet 25 mg  25 mg Oral Daily Nkwenti, Doris, NP   25 mg at 10/28/24 9043   magnesium  hydroxide (MILK OF MAGNESIA) suspension 30 mL  30 mL Oral Daily PRN Tex Drilling, NP       methimazole  (TAPAZOLE ) tablet 10 mg  10 mg Oral Daily Nkwenti, Doris, NP   10 mg at 10/28/24 9042   OLANZapine  (ZYPREXA ) injection 5 mg  5 mg Intramuscular TID PRN Tex Drilling, NP       OLANZapine  (ZYPREXA ) injection 5 mg  5 mg Intramuscular TID PRN Tex Drilling, NP       OLANZapine  (ZYPREXA ) tablet 15 mg  15 mg Oral QHS Jadapalle, Sree, MD   15 mg at 10/27/24 2158   OLANZapine  zydis (ZYPREXA ) disintegrating tablet 5 mg  5 mg Oral TID PRN Tex Drilling, NP       OLANZapine  zydis (ZYPREXA ) disintegrating tablet 5 mg  5 mg Oral TID PRN Tex Drilling, NP       pantoprazole  (PROTONIX ) EC tablet 40 mg  40 mg Oral Daily Nkwenti, Doris, NP   40 mg at 10/28/24 9043   traZODone  (DESYREL ) tablet 50 mg  50 mg Oral QHS PRN Tex Drilling, NP   50 mg at 10/27/24 2159   PTA Medications: Medications Prior to Admission  Medication Sig Dispense Refill Last Dose/Taking   Ascorbic Acid (VITAMIN C PO) Take 1 tablet by  mouth daily. (Patient not taking: Reported on 10/14/2024)      divalproex  (DEPAKOTE ) 500 MG DR tablet Take 1 tablet (500 mg total) by mouth at bedtime. (Patient not taking: Reported on 10/14/2024) 30 tablet 1    losartan  (COZAAR ) 25 MG tablet Take 1 tablet (25 mg total) by mouth daily. (Patient not taking: Reported on 10/14/2024) 90 tablet 1    methimazole  (TAPAZOLE ) 10 MG tablet Take 1 tablet (10 mg total) by mouth daily. (Patient not taking: Reported on 10/14/2024) 90 tablet 1    OLANZapine  (ZYPREXA ) 10 MG tablet Take 1 tablet (10 mg total) by mouth at bedtime. (Patient not taking: Reported on 10/14/2024) 30 tablet 1    pantoprazole  (PROTONIX ) 40 MG tablet Take 1 tablet (40 mg total) by mouth daily. (Patient not taking: Reported on 10/14/2024) 90 tablet 0    propranolol  (INDERAL ) 10 MG tablet Take 1 tablet (10 mg total) by mouth 2 (two) times daily. (Patient not taking: Reported on 10/14/2024) 180 tablet 1    VITAMIN D PO Take 1 tablet by mouth daily. (Patient not taking: Reported on 10/14/2024)      VITAMIN E PO Take 1 tablet by mouth daily. (Patient not taking: Reported on 10/14/2024)  Patient Stressors: Medication change or noncompliance    Patient Strengths: Communication skills   Treatment Modalities: Medication Management, Group therapy, Case management,  1 to 1 session with clinician, Psychoeducation, Recreational therapy.   Physician Treatment Plan for Primary Diagnosis: Schizophrenia, paranoid (HCC) Long Term Goal(s): Improvement in symptoms so as ready for discharge   Short Term Goals: Ability to identify changes in lifestyle to reduce recurrence of condition will improve Ability to verbalize feelings will improve Ability to disclose and discuss suicidal ideas Ability to demonstrate self-control will improve Ability to identify and develop effective coping behaviors will improve Ability to maintain clinical measurements within normal limits will improve  Medication  Management: Evaluate patient's response, side effects, and tolerance of medication regimen.  Therapeutic Interventions: 1 to 1 sessions, Unit Group sessions and Medication administration.  Evaluation of Outcomes: Progressing  Physician Treatment Plan for Secondary Diagnosis: Principal Problem:   Schizophrenia, paranoid (HCC)  Long Term Goal(s): Improvement in symptoms so as ready for discharge   Short Term Goals: Ability to identify changes in lifestyle to reduce recurrence of condition will improve Ability to verbalize feelings will improve Ability to disclose and discuss suicidal ideas Ability to demonstrate self-control will improve Ability to identify and develop effective coping behaviors will improve Ability to maintain clinical measurements within normal limits will improve     Medication Management: Evaluate patient's response, side effects, and tolerance of medication regimen.  Therapeutic Interventions: 1 to 1 sessions, Unit Group sessions and Medication administration.  Evaluation of Outcomes: Progressing   RN Treatment Plan for Primary Diagnosis: Schizophrenia, paranoid (HCC) Long Term Goal(s): Knowledge of disease and therapeutic regimen to maintain health will improve  Short Term Goals: Ability to verbalize frustration and anger appropriately will improve, Ability to demonstrate self-control, Ability to participate in decision making will improve, Ability to verbalize feelings will improve, Ability to disclose and discuss suicidal ideas, and Ability to identify and develop effective coping behaviors will improve  Medication Management: RN will administer medications as ordered by provider, will assess and evaluate patient's response and provide education to patient for prescribed medication. RN will report any adverse and/or side effects to prescribing provider.  Therapeutic Interventions: 1 on 1 counseling sessions, Psychoeducation, Medication administration, Evaluate  responses to treatment, Monitor vital signs and CBGs as ordered, Perform/monitor CIWA, COWS, AIMS and Fall Risk screenings as ordered, Perform wound care treatments as ordered.  Evaluation of Outcomes: Progressing   LCSW Treatment Plan for Primary Diagnosis: Schizophrenia, paranoid (HCC) Long Term Goal(s): Safe transition to appropriate next level of care at discharge, Engage patient in therapeutic group addressing interpersonal concerns.  Short Term Goals: Engage patient in aftercare planning with referrals and resources, Increase social support, Increase ability to appropriately verbalize feelings, Increase emotional regulation, Facilitate acceptance of mental health diagnosis and concerns, Facilitate patient progression through stages of change regarding substance use diagnoses and concerns, Identify triggers associated with mental health/substance abuse issues, and Increase skills for wellness and recovery  Therapeutic Interventions: Assess for all discharge needs, 1 to 1 time with Social worker, Explore available resources and support systems, Assess for adequacy in community support network, Educate family and significant other(s) on suicide prevention, Complete Psychosocial Assessment, Interpersonal group therapy.  Evaluation of Outcomes: Progressing   Progress in Treatment: Attending groups: Yes. and No. 10/28/24 Update: Yes.  Participating in groups: Yes. and No. 10/28/24 Update: Yes.  Taking medication as prescribed: Yes. 10/28/24 Update: Yes.  Toleration medication: Yes. 10/28/24 Update: Yes.  Family/Significant other contact made: No,  will contact:  if given permission. 10/28/24 Update: No, will contact: CSW to contact once permission is granted.  Patient understands diagnosis: No. 10/28/24 Update: No.  Discussing patient identified problems/goals with staff: No.  10/28/24 Update: No.  Medical problems stabilized or resolved: Yes.  10/28/24 Update: Yes.  Denies suicidal/homicidal  ideation: Yes. 10/28/24 Update: Yes.  Issues/concerns per patient self-inventory: No. 10/28/24 Update: No.  Other: none.   New problem(s) identified: No, Describe:  None identified 10/23/24 Update: No changes at this time. 10/28/24 Update: No changes at this time.    New Short Term/Long Term Goal(s): elimination of symptoms of psychosis, medication management for mood stabilization; elimination of SI thoughts; development of comprehensive mental wellness plan. 10/23/24 Update: No changes at this time. 10/28/24 Update: No changes at this time.    Patient Goals:  I haven't set any goals for the hospital yet. I don't need psychiatric help 10/23/24 Update: No changes at this time. 10/28/24 Update: No changes at this time.    Discharge Plan or Barriers: CSW will assist with appropriate discharge plan 10/23/24 Update: No changes at this time. 10/28/24 Update: CSW to continue to meet with DSS to engage in safe discharge planning and connect patient with appropriate resources.    Reason for Continuation of Hospitalization: Delusions  Medication stabilization 10/28/24 Update: Delusions  Medication stabilization   Estimated Length of Stay: 1 to 7 days 10/23/24 Update: No changes at this time. 10/28/24 Update: TBD.   Last 3 [place] Suicide Severity Risk Score: Flowsheet Row Admission (Current) from 10/17/2024 in [facility] ED from 10/14/2024 in [facility] ED to Hosp-Admission (Discharged) from 03/13/2024 in [place] [facility]  C-SSRS RISK CATEGORY No Risk No Risk No Risk    Last PHQ 2/9 Scores:     No data to display          Scribe for Treatment Team: Alveta CHRISTELLA Kerns, KEN 10/28/2024 10:33 AM

## 2024-10-28 NOTE — Progress Notes (Signed)
   10/27/24 2000  Psych Admission Type (Psych Patients Only)  Admission Status Involuntary  Psychosocial Assessment  Patient Complaints None  Eye Contact Brief  Facial Expression Flat  Affect Flat  Speech Soft  Interaction Minimal  Motor Activity Slow  Appearance/Hygiene Unremarkable  Behavior Characteristics Cooperative  Mood Pleasant  Thought Process  Coherency Circumstantial  Content Ambivalence  Delusions None reported or observed  Perception WDL  Hallucination None reported or observed  Judgment Impaired  Confusion Mild  Danger to Self  Current suicidal ideation? Denies  Agreement Not to Harm Self Yes  Description of Agreement Verbal  Danger to Others  Danger to Others None reported or observed

## 2024-10-28 NOTE — Group Note (Signed)
 Recreation Therapy Group Note   Group Topic:Emotion Expression  Group Date: 10/28/2024 Start Time: 1510 End Time: 1615 Facilitators: Celestia Jeoffrey BRAVO, LRT, CTRS Location: Courtyard  Group Description: Music. Patients are encouraged to name their favorite song(s) for LRT to play song through speaker for group to hear, while in the courtyard getting fresh air and sunlight. Patients educated on the definition of leisure and the importance of having different leisure interests outside of the hospital. Group discussed how leisure activities can often be used as pharmacologist and that listening to music and being outside are examples.    Goal Area(s) Addressed:  Patient will identify a current leisure interest.  Patient will practice making a positive decision. Patient will have the opportunity to try a new leisure activity.   Affect/Mood: Euthymic   Participation Level: Moderate   Participation Quality: Independent   Behavior: Calm and Cooperative   Speech/Thought Process: Loose association   Insight: Fair   Judgement: Fair    Modes of Intervention: Guided Discussion and Music   Patient Response to Interventions:  Receptive   Education Outcome:  In group clarification offered    Clinical Observations/Individualized Feedback: Shari [name] was active in their participation of session activities and group discussion. Pt chose to sit away from peers while outside. Pt was noted to be dancing along to the music.    Plan: Continue to engage patient in RT group sessions 2-3x/week.   Jeoffrey BRAVO Celestia, LRT, CTRS 10/28/2024 5:21 PM

## 2024-10-28 NOTE — Progress Notes (Signed)
 [facility] MD Progress Note  10/27/2024 9:52 AM UZMA HELLMER  MRN:  [id number] Patient is a 68 year old female with a history of Schizoaffective Disorder, Bipolar Type who presents via [employer] under IVC, initiated by CM, Falencio with re-entry program, to [facility] for assessment. Per IVC, [name] has been diagnosed with Schizophrenia and is refusing to take medication. [name] isn't sleeping. Residents report her screaming and yelling all night long.  [name] responding to internal stimuli.  [name] verbally aggressive.  The respondent has locked all of the residents inside of the transitional housing home.  They could not leave the home which prompted the IVC.  Residents did not have access to food, the restroom or anything as a result of being locked in.  [name] is currently under Adult protective services care. Patient is admitted to [facility] unit with Q15 min safety monitoring. Multidisciplinary team approach is offered. Medication management; group/milieu therapy is offered.   Subjective:  Chart reviewed, case discussed in multidisciplinary meeting, patient seen during rounds.   She mostly sits in her room and asks about when she is going home and she does not like that she has been here for long time Patient is noted to be resting in her room. Patient continues to be confused about transitional housing and had family not being there she continues to be upset about being here somewhat irritable at times no PRNs given.  Past Psychiatric History: see h&P Family History:  Family History  Problem Relation Age of Onset   Diabetes Mother    CAD Mother    Lung cancer Father    Social History:  Social History   Substance and Sexual Activity  Alcohol Use Yes   Comment: beer occasionally     Social History   Substance and Sexual Activity  Drug Use No    Social History   Socioeconomic History   Marital status: Widowed    Spouse name: Not on file   Number of  children: Not on file   Years of education: Not on file   Highest education level: Not on file  Occupational History   Not on file  Tobacco Use   Smoking status: Every Day    Current packs/day: 0.50    Average packs/day: 0.5 packs/day for 40.0 years (20.0 ttl pk-yrs)    Types: Cigarettes   Smokeless tobacco: Never  Vaping Use   Vaping status: Never Used  Substance and Sexual Activity   Alcohol use: Yes    Comment: beer occasionally   Drug use: No   Sexual activity: Not Currently    Birth control/protection: Post-menopausal  Other Topics Concern   Not on file  Social History Narrative   Not on file   Social Drivers of Health   Financial Resource Strain: Not on file  Food Insecurity: No Food Insecurity (10/17/2024)   Hunger Vital Sign    Worried About Running Out of Food in the Last Year: Never true    Ran Out of Food in the Last Year: Never true  Transportation Needs: No Transportation Needs (10/17/2024)   PRAPARE - Administrator, Civil Service (Medical): No    Lack of Transportation (Non-Medical): No  Recent Concern: Transportation Needs - Unmet Transportation Needs (10/14/2024)   PRAPARE - Administrator, Civil Service (Medical): Yes    Lack of Transportation (Non-Medical): Yes  Physical Activity: Not on file  Stress: Not on file  Social Connections: Unknown (10/17/2024)  Social Connection and Isolation Panel    Frequency of Communication with Friends and Family: Patient unable to answer    Frequency of Social Gatherings with Friends and Family: Patient unable to answer    Attends Religious Services: Patient unable to answer    Active Member of Clubs or Organizations: Patient declined    Attends Banker Meetings: Patient unable to answer    Marital Status: Widowed   Past Medical History:  Past Medical History:  Diagnosis Date   Eye globe prosthesis    GERD (gastroesophageal reflux disease)    History of blood transfusion  1973   related to abscess burst in my stomach   Hyperlipemia    Hyperlipidemia    Hypertension    Osteoarthritis of back    Lowerback    SVD (spontaneous vaginal delivery)    x 1, baby died at 2 wks of age   Type II diabetes mellitus (HCC)     Past Surgical History:  Procedure Laterality Date   APPENDECTOMY     COLONOSCOPY     DILATATION & CURETTAGE/HYSTEROSCOPY WITH MYOSURE N/A 02/25/2015   Procedure: DILATATION & CURETTAGE/HYSTEROSCOPY WITH MYOSURE;  Surgeon: Truman Corona, MD;  Location: [facility];  Service: Gynecology;  Laterality: N/A;   DILATION AND CURETTAGE OF UTERUS     ENUCLEATION Right 03/16/2017   ENUCLEATION Right 03/16/2017   Procedure: ENUCLEATION RIGHT EYE;  Surgeon: Loyd Kathryne Palm, MD;  Location: [facility] OR;  Service: Ophthalmology;  Laterality: Right;   EYE SURGERY     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6, 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   EYE SURGERY Right ~ 1974   S/P initial eye injury; scissors stuck in my eye   LAPAROSCOPIC CHOLECYSTECTOMY     SHOULDER ARTHROSCOPY WITH ROTATOR CUFF REPAIR Left    wire stitches per patient   SHOULDER ARTHROSCOPY WITH ROTATOR CUFF REPAIR Right     Current Medications: Current Facility-Administered Medications  Medication Dose Route Frequency Provider Last Rate Last Admin   acetaminophen  (TYLENOL ) tablet 650 mg  650 mg Oral Q6H PRN Tex Drilling, NP   650 mg at 10/27/24 2214   alum & mag hydroxide-simeth (MAALOX/MYLANTA) 200-200-20 MG/5ML suspension 30 mL  30 mL Oral Q4H PRN Tex Drilling, NP       cloNIDine  (CATAPRES ) tablet 0.1 mg  0.1 mg Oral BID PRN Tex Drilling, NP       divalproex  (DEPAKOTE ) DR tablet 750 mg  750 mg Oral Q12H Jadapalle, Sree, MD   750 mg at 10/27/24 2157   losartan  (COZAAR ) tablet 25 mg  25 mg Oral Daily Nkwenti, Doris, NP   25 mg at 10/27/24 9076   magnesium  hydroxide (MILK OF MAGNESIA) suspension 30 mL  30 mL Oral Daily PRN Tex Drilling, NP       methimazole  (TAPAZOLE ) tablet 10 mg  10 mg Oral Daily Nkwenti, Doris, NP    10 mg at 10/27/24 9076   OLANZapine  (ZYPREXA ) injection 5 mg  5 mg Intramuscular TID PRN Tex Drilling, NP       OLANZapine  (ZYPREXA ) injection 5 mg  5 mg Intramuscular TID PRN Tex Drilling, NP       OLANZapine  (ZYPREXA ) tablet 15 mg  15 mg Oral QHS Jadapalle, Sree, MD   15 mg at 10/27/24 2158   OLANZapine  zydis (ZYPREXA ) disintegrating tablet 5 mg  5 mg Oral TID PRN Tex Drilling, NP       OLANZapine  zydis (ZYPREXA ) disintegrating tablet 5 mg  5 mg Oral TID PRN  Tex Drilling, NP       pantoprazole  (PROTONIX ) EC tablet 40 mg  40 mg Oral Daily Nkwenti, Doris, NP   40 mg at 10/27/24 0923   traZODone  (DESYREL ) tablet 50 mg  50 mg Oral QHS PRN Tex Drilling, NP   50 mg at 10/27/24 2159    Lab Results:  No results found for this or any previous visit (from the past 48 hours).    Blood Alcohol level:  Lab Results  Component Value Date   [facility] <15 10/14/2024   ETH <10 07/02/2022    Metabolic Disorder Labs: Lab Results  Component Value Date   HGBA1C 5.5 10/14/2024   MPG 111.15 10/14/2024   MPG 137 01/06/2022   Lab Results  Component Value Date   PROLACTIN 5.8 10/14/2024   Lab Results  Component Value Date   CHOL 167 10/14/2024   TRIG 68 10/14/2024   HDL 60 10/14/2024   CHOLHDL 2.8 10/14/2024   VLDL 14 10/14/2024   LDLCALC 93 10/14/2024   LDLCALC 73 03/13/2024    Physical Findings: AIMS:  , ,  ,  ,    CIWA:    COWS:      Psychiatric Specialty Exam:  Presentation  General Appearance:  Appropriate for Environment  Eye Contact: Fleeting  Speech: Normal Rate  Speech Volume: Decreased    Mood and Affect  Mood:fine  Affect: Flat   Thought Process  Thought Processes: Disorganized  Orientation:Partial  Thought Content:Illogical; Paranoid Ideation  Hallucinations: Denies  Ideas of Reference:Delusions; Paranoia  Suicidal Thoughts: Denies  Homicidal Thoughts: Denies   Sensorium  Memory: Immediate Fair; Recent  Fair  Judgment: Impaired  Insight: Shallow   Executive Functions  Concentration: Fair  Attention Span: Fair  Recall: Poor  Fund of Knowledge: Fair  Language: Fair   Psychomotor Activity  Psychomotor Activity: No data recorded  Musculoskeletal: Strength & Muscle Tone: within normal limits Gait & Station: normal Assets  Assets: Manufacturing Systems Engineer; Desire for Improvement; Social Support    Physical Exam: Physical Exam Vitals and nursing note reviewed.    ROS Blood pressure (!) 120/59, pulse 80, temperature 98.3 F (36.8 C), resp. rate 17, height 5' 8 (1.727 m), weight 48.3 kg, SpO2 97%. Body mass index is 16.19 kg/m.  Diagnosis: Principal Problem:   Schizophrenia, paranoid (HCC)    Treatment Plan Summary:  Safety and Monitoring:             -- Involuntary admission to inpatient psychiatric unit for safety, stabilization and treatment             -- Daily contact with patient to assess and evaluate symptoms and progress in treatment             -- Patient's case to be discussed in multi-disciplinary team meeting             -- Observation Level: q15 minute checks             -- Vital signs:  q12 hours             -- Precautions: suicide, elopement, and assault   2. Psychiatric Diagnoses and Treatment:           Increase dose pf   Depakote   750 mg twice daily-Depakote  level on 10/17/2024 is subtherapeutic-28- 10/25/24:recheck the Depakote  levels- 32 Increase Zyprexa  to 15 mg nightly     -- The risks/benefits/side-effects/alternatives to this medication were discussed in detail with the patient and time was given for questions.  The patient consents to medication trial.                -- Metabolic profile and EKG monitoring obtained while on an atypical antipsychotic (BMI: Lipid Panel: HbgA1c: QTc:)              -- Encouraged patient to participate in unit milieu and in scheduled group therapies  4. Discharge Planning:   -- Social work and case  management to assist with discharge planning and identification of hospital follow-up needs prior to discharge  -- Estimated LOS: 3-4 days  Millie JONELLE Manners, MD 10/28/2024, 9:52 AM

## 2024-10-29 DIAGNOSIS — F200 Paranoid schizophrenia: Secondary | ICD-10-CM | POA: Diagnosis not present

## 2024-10-29 NOTE — Group Note (Signed)
 Date:  10/29/2024 Time:  9:17 PM  Group Topic/Focus:  Wrap-Up Group:   The focus of this group is to help patients review their daily goal of treatment and discuss progress on daily workbooks.    Participation Level:  Active  Participation Quality:  Appropriate  Affect:  Appropriate  Cognitive:  Alert  Insight: Appropriate  Engagement in Group:  Engaged  Modes of Intervention:  Discussion  Additional Comments:    Shari [name] [name] 10/29/2024, 9:17 PM

## 2024-10-29 NOTE — Progress Notes (Signed)
   10/29/24 1300  Psych Admission Type (Psych Patients Only)  Admission Status Involuntary  Psychosocial Assessment  Patient Complaints None  Eye Contact Brief  Facial Expression Flat  Affect Flat  Speech Soft  Interaction Minimal  Motor Activity Slow  Appearance/Hygiene Unremarkable  Behavior Characteristics Cooperative  Mood Pleasant  Thought Process  Coherency Circumstantial  Content Ambivalence  Delusions None reported or observed  Perception WDL  Hallucination None reported or observed  Judgment Impaired  Confusion Mild  Danger to Self  Current suicidal ideation? Denies  Agreement Not to Harm Self Yes  Description of Agreement verbal  Danger to Others  Danger to Others None reported or observed

## 2024-10-29 NOTE — Progress Notes (Signed)
   10/29/24 2300  Psych Admission Type (Psych Patients Only)  Admission Status Involuntary  Psychosocial Assessment  Patient Complaints None  Eye Contact Brief  Facial Expression Flat  Affect Flat  Speech Soft  Interaction Minimal  Motor Activity Slow  Appearance/Hygiene Unremarkable;In scrubs  Behavior Characteristics Cooperative  Mood Pleasant  Thought Process  Coherency Circumstantial  Content Ambivalence  Delusions None reported or observed  Perception WDL  Hallucination None reported or observed  Judgment Impaired  Confusion Mild  Danger to Self  Current suicidal ideation? Denies  Agreement Not to Harm Self Yes  Description of Agreement Verbal  Danger to Others  Danger to Others None reported or observed

## 2024-10-29 NOTE — Group Note (Signed)
 Recreation Therapy Group Note   Group Topic:Health and Wellness  Group Date: 10/29/2024 Start Time: 1445 End Time: 1530 Facilitators: Celestia Jeoffrey BRAVO, LRT, CTRS Location: Courtyard  Group Description: Outdoor Recreation. Patients had the option to play corn hole, ring toss, UNO, or listening to music while outside in the courtyard getting fresh air and sunlight. Patients helped water  and prune the raised garden beds. LRT and patients discussed things that they enjoy doing in their free time outside of the hospital. LRT encouraged patients to drink water  after being active and getting their heart rate up.   Goal Area(s) Addressed: Patient will identify leisure interests.  Patient will practice healthy decision making. Patient will engage in recreation activity.   Affect/Mood: Appropriate   Participation Level: Minimal    Clinical Observations/Individualized Feedback: Shari [name] came late to group. Pt was noted to be talking to herself while present. Pt was in group less than half of the session.   Plan: Continue to engage patient in RT group sessions 2-3x/week.   Jeoffrey BRAVO Celestia, LRT, CTRS 10/29/2024 4:36 PM

## 2024-10-29 NOTE — Group Note (Signed)
 LCSW Group Therapy Note  Group Date: 10/29/2024 Start Time: 1300 End Time: 1340   Type of Therapy and Topic:  Group Therapy - Healthy vs Unhealthy Coping Skills  Participation Level:  Did Not Attend   Description of Group The focus of this group was to determine what unhealthy coping techniques typically are used by group members and what healthy coping techniques would be helpful in coping with various problems. Patients were guided in becoming aware of the differences between healthy and unhealthy coping techniques. Patients were asked to identify 2-3 healthy coping skills they would like to learn to use more effectively.  Therapeutic Goals Patients learned that coping is what human beings do all day long to deal with various situations in their lives Patients defined and discussed healthy vs unhealthy coping techniques Patients identified their preferred coping techniques and identified whether these were healthy or unhealthy Patients determined 2-3 healthy coping skills they would like to become more familiar with and use more often. Patients provided support and ideas to each other   Summary of Patient Progress:  X   Therapeutic Modalities Cognitive Behavioral Therapy Motivational Interviewing  Shari [name], [place] 10/29/2024  2:29 PM

## 2024-10-29 NOTE — Plan of Care (Signed)
   Problem: Activity: Goal: Interest or engagement in activities will improve Outcome: Progressing Goal: Sleeping patterns will improve Outcome: Progressing

## 2024-10-29 NOTE — Progress Notes (Signed)
 [facility] MD Progress Note  10/29/2024 12:03 PM Shari [name]  MRN:  [id number] Patient is a 68 year old female with a history of Schizoaffective Disorder, Bipolar Type who presents via [employer] under IVC, initiated by CM, Falencio with re-entry program, to [facility] for assessment. Per IVC, [name] has been diagnosed with Schizophrenia and is refusing to take medication. [name] isn't sleeping. Residents report her screaming and yelling all night long.  [name] responding to internal stimuli.  [name] verbally aggressive.  The respondent has locked all of the residents inside of the transitional housing home.  They could not leave the home which prompted the IVC.  Residents did not have access to food, the restroom or anything as a result of being locked in.  [name] is currently under Adult protective services care. Patient is admitted to [facility] unit with Q15 min safety monitoring. Multidisciplinary team approach is offered. Medication management; group/milieu therapy is offered.   Subjective:  Chart reviewed, case discussed in multidisciplinary meeting, patient seen during rounds.  Patient is noted to be resting after lunch.  She offers no complaints.  She remains discharge focused and continues to demand to be discharged back home.  She remains delusional about her housing situation and argues that the transitional housing is her house and the people in the house or intruders.  Patient is unable to discuss the pros and cons of the treatment options and unable to rationalize the events that led up to the hospitalization.  [name] not endorsing SI/HI and is not endorsing hallucinations. Past Psychiatric History: see h&P Family History:  Family History  Problem Relation Age of Onset   Diabetes Mother    CAD Mother    Lung cancer Father    Social History:  Social History   Substance and Sexual Activity  Alcohol Use Yes   Comment: beer occasionally     Social History    Substance and Sexual Activity  Drug Use No    Social History   Socioeconomic History   Marital status: Widowed    Spouse name: Not on file   Number of children: Not on file   Years of education: Not on file   Highest education level: Not on file  Occupational History   Not on file  Tobacco Use   Smoking status: Every Day    Current packs/day: 0.50    Average packs/day: 0.5 packs/day for 40.0 years (20.0 ttl pk-yrs)    Types: Cigarettes   Smokeless tobacco: Never  Vaping Use   Vaping status: Never Used  Substance and Sexual Activity   Alcohol use: Yes    Comment: beer occasionally   Drug use: No   Sexual activity: Not Currently    Birth control/protection: Post-menopausal  Other Topics Concern   Not on file  Social History Narrative   Not on file   Social Drivers of Health   Financial Resource Strain: Not on file  Food Insecurity: No Food Insecurity (10/17/2024)   Hunger Vital Sign    Worried About Running Out of Food in the Last Year: Never true    Ran Out of Food in the Last Year: Never true  Transportation Needs: No Transportation Needs (10/17/2024)   PRAPARE - Administrator, Civil Service (Medical): No    Lack of Transportation (Non-Medical): No  Recent Concern: Transportation Needs - Unmet Transportation Needs (10/14/2024)   PRAPARE - Administrator, Civil Service (Medical): Yes  Lack of Transportation (Non-Medical): Yes  Physical Activity: Not on file  Stress: Not on file  Social Connections: Unknown (10/17/2024)   Social Connection and Isolation Panel    Frequency of Communication with Friends and Family: Patient unable to answer    Frequency of Social Gatherings with Friends and Family: Patient unable to answer    Attends Religious Services: Patient unable to answer    Active Member of Clubs or Organizations: Patient declined    Attends Banker Meetings: Patient unable to answer    Marital Status: Widowed    Past Medical History:  Past Medical History:  Diagnosis Date   Eye globe prosthesis    GERD (gastroesophageal reflux disease)    History of blood transfusion 1973   related to abscess burst in my stomach   Hyperlipemia    Hyperlipidemia    Hypertension    Osteoarthritis of back    Lowerback    SVD (spontaneous vaginal delivery)    x 1, baby died at 2 wks of age   Type II diabetes mellitus (HCC)     Past Surgical History:  Procedure Laterality Date   APPENDECTOMY     COLONOSCOPY     DILATATION & CURETTAGE/HYSTEROSCOPY WITH MYOSURE N/A 02/25/2015   Procedure: DILATATION & CURETTAGE/HYSTEROSCOPY WITH MYOSURE;  Surgeon: Truman Corona, MD;  Location: [facility];  Service: Gynecology;  Laterality: N/A;   DILATION AND CURETTAGE OF UTERUS     ENUCLEATION Right 03/16/2017   ENUCLEATION Right 03/16/2017   Procedure: ENUCLEATION RIGHT EYE;  Surgeon: Loyd Kathryne Palm, MD;  Location: [facility] OR;  Service: Ophthalmology;  Laterality: Right;   EYE SURGERY     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6, 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   EYE SURGERY Right ~ 1974   S/P initial eye injury; scissors stuck in my eye   LAPAROSCOPIC CHOLECYSTECTOMY     SHOULDER ARTHROSCOPY WITH ROTATOR CUFF REPAIR Left    wire stitches per patient   SHOULDER ARTHROSCOPY WITH ROTATOR CUFF REPAIR Right     Current Medications: Current Facility-Administered Medications  Medication Dose Route Frequency Provider Last Rate Last Admin   acetaminophen  (TYLENOL ) tablet 650 mg  650 mg Oral Q6H PRN Tex Drilling, NP   650 mg at 10/27/24 2214   alum & mag hydroxide-simeth (MAALOX/MYLANTA) 200-200-20 MG/5ML suspension 30 mL  30 mL Oral Q4H PRN Nkwenti, Drilling, NP       cloNIDine  (CATAPRES ) tablet 0.1 mg  0.1 mg Oral BID PRN Tex Drilling, NP       divalproex  (DEPAKOTE ) DR tablet 750 mg  750 mg Oral Q12H Cielle Aguila, MD   750 mg at 10/29/24 9052   losartan  (COZAAR ) tablet 25 mg  25 mg Oral Daily Nkwenti, Doris, NP   25 mg at 10/29/24 9052   magnesium   hydroxide (MILK OF MAGNESIA) suspension 30 mL  30 mL Oral Daily PRN Tex Drilling, NP       methimazole  (TAPAZOLE ) tablet 10 mg  10 mg Oral Daily Nkwenti, Doris, NP   10 mg at 10/29/24 9052   OLANZapine  (ZYPREXA ) injection 5 mg  5 mg Intramuscular TID PRN Tex Drilling, NP       OLANZapine  (ZYPREXA ) injection 5 mg  5 mg Intramuscular TID PRN Tex Drilling, NP       OLANZapine  (ZYPREXA ) tablet 20 mg  20 mg Oral QHS Leonardo Makris, MD   20 mg at 10/28/24 2112   OLANZapine  zydis (ZYPREXA ) disintegrating tablet 5 mg  5 mg Oral TID  PRN Tex Drilling, NP       OLANZapine  zydis (ZYPREXA ) disintegrating tablet 5 mg  5 mg Oral TID PRN Tex Drilling, NP       pantoprazole  (PROTONIX ) EC tablet 40 mg  40 mg Oral Daily Nkwenti, Doris, NP   40 mg at 10/29/24 0948   traZODone  (DESYREL ) tablet 50 mg  50 mg Oral QHS PRN Tex Drilling, NP   50 mg at 10/28/24 2111    Lab Results:  No results found for this or any previous visit (from the past 48 hours).    Blood Alcohol level:  Lab Results  Component Value Date   [facility] <15 10/14/2024   ETH <10 07/02/2022    Metabolic Disorder Labs: Lab Results  Component Value Date   HGBA1C 5.5 10/14/2024   MPG 111.15 10/14/2024   MPG 137 01/06/2022   Lab Results  Component Value Date   PROLACTIN 5.8 10/14/2024   Lab Results  Component Value Date   CHOL 167 10/14/2024   TRIG 68 10/14/2024   HDL 60 10/14/2024   CHOLHDL 2.8 10/14/2024   VLDL 14 10/14/2024   LDLCALC 93 10/14/2024   LDLCALC 73 03/13/2024    Physical Findings: AIMS:  , ,  ,  ,    CIWA:    COWS:      Psychiatric Specialty Exam:  Presentation  General Appearance:  Appropriate for Environment  Eye Contact: Fleeting  Speech: Normal Rate  Speech Volume: Decreased    Mood and Affect  Mood:fine  Affect: Flat   Thought Process  Thought Processes: Disorganized  Orientation:Partial  Thought Content:Illogical; Paranoid Ideation  Hallucinations: Denies  Ideas of  Reference:Delusions; Paranoia  Suicidal Thoughts: Denies  Homicidal Thoughts: Denies   Sensorium  Memory: Immediate Fair; Recent Fair  Judgment: Impaired  Insight: Shallow   Executive Functions  Concentration: Fair  Attention Span: Fair  Recall: Poor  Fund of Knowledge: Fair  Language: Fair   Psychomotor Activity  Psychomotor Activity: No data recorded  Musculoskeletal: Strength & Muscle Tone: within normal limits Gait & Station: normal Assets  Assets: Manufacturing Systems Engineer; Desire for Improvement; Social Support    Physical Exam: Physical Exam Vitals and nursing note reviewed.    ROS Blood pressure (!) 102/57, pulse 81, temperature (!) 97.5 F (36.4 C), resp. rate 14, height 5' 8 (1.727 m), weight 48.3 kg, SpO2 100%. Body mass index is 16.19 kg/m.  Diagnosis: Principal Problem:   Schizophrenia, paranoid (HCC)    Treatment Plan Summary:  Safety and Monitoring:             -- Involuntary admission to inpatient psychiatric unit for safety, stabilization and treatment             -- Daily contact with patient to assess and evaluate symptoms and progress in treatment             -- Patient's case to be discussed in multi-disciplinary team meeting             -- Observation Level: q15 minute checks             -- Vital signs:  q12 hours             -- Precautions: suicide, elopement, and assault   2. Psychiatric Diagnoses and Treatment:            Depakote   750 mg twice daily-Depakote  level on 10/17/2024 is subtherapeutic-28- 10/25/24:recheck the Depakote  levels- 32 Zyprexa  to 15 mg nightly     --  The risks/benefits/side-effects/alternatives to this medication were discussed in detail with the patient and time was given for questions. The patient consents to medication trial.                -- Metabolic profile and EKG monitoring obtained while on an atypical antipsychotic (BMI: Lipid Panel: HbgA1c: QTc:)              -- Encouraged patient  to participate in unit milieu and in scheduled group therapies  4. Discharge Planning:   -- Social work and case management to assist with discharge planning and identification of hospital follow-up needs prior to discharge  -- Estimated LOS: 3-4 days  Allyn Foil, MD 10/29/2024, 12:03 PM

## 2024-10-29 NOTE — Group Note (Signed)
 Date:  10/29/2024 Time:  11:21 AM  Group Topic/Focus:   Thanksgiving Gratitude  Gratitude helps us  slow down and see the good that's already here. Science shows that practicing gratitude can boost happiness, lower stress, and even strengthen relationships. But gratitude is more than positive thinking it's a way of shifting our perspective, one day at a time. Choosing gratitude every day is monumental in shaping the quality of your daily life for the better. But, gratitude grows best in community. Gratitude is powerful. It changes our perspective, softens our hearts, and helps us  see the good even when life feels heavy. Studies show that gratitude has measurable benefits, it boosts happiness, lowers stress, and strengthens relationships.   Participation Level:  Active  Participation Quality:  Appropriate  Affect:  Appropriate  Cognitive:  Appropriate  Insight: Appropriate  Engagement in Group:  Engaged  Modes of Intervention:  Activity and Discussion  Additional Comments:  N/A  Harlene LITTIE Gavel 10/29/2024, 11:21 AM

## 2024-10-29 NOTE — Group Note (Signed)
 Recreation Therapy Group Note   Group Topic:Stress Management  Group Date: 10/29/2024 Start Time: 1100 End Time: 1135 Facilitators: Celestia Jeoffrey BRAVO, LRT, CTRS Location: Dayroom  Group Description: PMR (Progressive Muscle Relaxation). LRT educates patients on what PMR is and the benefits that come from it. Patients are asked to sit with their feet flat on the floor while sitting up and all the way back in their chair, if possible. LRT and pts follow a prompt through a speaker that requires you to tense and release different muscles in their body and focus on their breathing. During session, lights are off and soft music is being played. Pts are given a stress ball to use if needed.   Goal Area(s) Addressed:  Patients will be able to describe progressive muscle relaxation.  Patient will practice using relaxation technique. Patient will identify a new coping skill.  Patient will follow multistep directions to reduce anxiety and stress.   Affect/Mood: N/A   Participation Level: Did not attend    Clinical Observations/Individualized Feedback: Patient did not attend group.   Plan: Continue to engage patient in RT group sessions 2-3x/week.   [address], LRT, CTRS 10/29/2024 1:42 PM

## 2024-10-30 DIAGNOSIS — F200 Paranoid schizophrenia: Secondary | ICD-10-CM | POA: Diagnosis not present

## 2024-10-30 LAB — THYROID PANEL WITH TSH
Free Thyroxine Index: 1.7 (ref 1.2–4.9)
T3 Uptake Ratio: 30 % (ref 24–39)
T4, Total: 5.8 ug/dL (ref 4.5–12.0)
TSH: <0.005 u[IU]/mL — ABNORMAL LOW (ref 0.450–4.500)

## 2024-10-30 NOTE — Plan of Care (Signed)
  Problem: Education: Goal: Emotional status will improve Outcome: Progressing   Problem: Activity: Goal: Interest or engagement in activities will improve Outcome: Progressing   Problem: Education: Goal: Mental status will improve Outcome: Not Progressing   

## 2024-10-30 NOTE — Progress Notes (Signed)
   10/30/24 2100  Psych Admission Type (Psych Patients Only)  Admission Status Involuntary  Psychosocial Assessment  Patient Complaints None  Eye Contact Brief  Facial Expression Flat  Affect Flat  Speech Soft  Interaction Minimal  Motor Activity Slow  Appearance/Hygiene In scrubs;Unremarkable  Behavior Characteristics Cooperative;Appropriate to situation  Mood Pleasant  Thought Process  Coherency Circumstantial  Content Ambivalence  Delusions None reported or observed  Perception WDL  Hallucination None reported or observed  Judgment Impaired  Confusion Mild  Danger to Self  Current suicidal ideation? Denies  Agreement Not to Harm Self Yes  Description of Agreement verbal  Danger to Others  Danger to Others None reported or observed   Mood/Behavior:  Pleasant and cooperative.    Psych assessment: Denies SI/HI and AVH.     Interaction / Group attendance:  Present in the milieu. Minimal interaction with peers and staff.  Attended group.   Medication/ PRNs: Compliant with scheduled medications. Required PRN Tyenol for shoulder and noted effective.   Pain: Denies   15 min checks in place for safety.

## 2024-10-30 NOTE — Progress Notes (Signed)
 [facility] MD Progress Note  10/30/2024 12:11 PM CISSY GALBREATH  MRN:  [id number] Patient is a 68 year old female with a history of Schizoaffective Disorder, Bipolar Type who presents via G[employer] under IVC, initiated by CM, Falencio with re-entry program, to [facility] for assessment. Per IVC, [name] has been diagnosed with Schizophrenia and is refusing to take medication. [name] isn't sleeping. Residents report her screaming and yelling all night long.  [name] responding to internal stimuli.  [name] verbally aggressive.  The respondent has locked all of the residents inside of the transitional housing home.  They could not leave the home which prompted the IVC.  Residents did not have access to food, the restroom or anything as a result of being locked in.  [name] is currently under Adult protective services care. Patient is admitted to [facility] unit with Q15 min safety monitoring. Multidisciplinary team approach is offered. Medication management; group/milieu therapy is offered.   Subjective:  Chart reviewed, case discussed in multidisciplinary meeting, patient seen during rounds.  Patient is noted to be resting in bed.  She offers no complaints.  She remains delusional and paranoid about her housing situation, her support in the community.  She reports that she needs to go back to her house and denies having any need for any help after her discharge.  She denies SI/HI/plan.  She denies hallucinations but patient is noted to be responding to stimuli in her room and leaving a day area.  Patient is attending her ADLs.  Past Psychiatric History: see h&P Family History:  Family History  Problem Relation Age of Onset   Diabetes Mother    CAD Mother    Lung cancer Father    Social History:  Social History   Substance and Sexual Activity  Alcohol Use Yes   Comment: beer occasionally     Social History   Substance and Sexual Activity  Drug Use No    Social History    Socioeconomic History   Marital status: Widowed    Spouse name: Not on file   Number of children: Not on file   Years of education: Not on file   Highest education level: Not on file  Occupational History   Not on file  Tobacco Use   Smoking status: Every Day    Current packs/day: 0.50    Average packs/day: 0.5 packs/day for 40.0 years (20.0 ttl pk-yrs)    Types: Cigarettes   Smokeless tobacco: Never  Vaping Use   Vaping status: Never Used  Substance and Sexual Activity   Alcohol use: Yes    Comment: beer occasionally   Drug use: No   Sexual activity: Not Currently    Birth control/protection: Post-menopausal  Other Topics Concern   Not on file  Social History Narrative   Not on file   Social Drivers of Health   Financial Resource Strain: Not on file  Food Insecurity: No Food Insecurity (10/17/2024)   Hunger Vital Sign    Worried About Running Out of Food in the Last Year: Never true    Ran Out of Food in the Last Year: Never true  Transportation Needs: No Transportation Needs (10/17/2024)   PRAPARE - Administrator, Civil Service (Medical): No    Lack of Transportation (Non-Medical): No  Recent Concern: Transportation Needs - Unmet Transportation Needs (10/14/2024)   PRAPARE - Administrator, Civil Service (Medical): Yes    Lack of Transportation (Non-Medical): Yes  Physical Activity: Not on file  Stress: Not on file  Social Connections: Unknown (10/17/2024)   Social Connection and Isolation Panel    Frequency of Communication with Friends and Family: Patient unable to answer    Frequency of Social Gatherings with Friends and Family: Patient unable to answer    Attends Religious Services: Patient unable to answer    Active Member of Clubs or Organizations: Patient declined    Attends Banker Meetings: Patient unable to answer    Marital Status: Widowed   Past Medical History:  Past Medical History:  Diagnosis Date   Eye  globe prosthesis    GERD (gastroesophageal reflux disease)    History of blood transfusion 1973   related to abscess burst in my stomach   Hyperlipemia    Hyperlipidemia    Hypertension    Osteoarthritis of back    Lowerback    SVD (spontaneous vaginal delivery)    x 1, baby died at 2 wks of age   Type II diabetes mellitus (HCC)     Past Surgical History:  Procedure Laterality Date   APPENDECTOMY     COLONOSCOPY     DILATATION & CURETTAGE/HYSTEROSCOPY WITH MYOSURE N/A 02/25/2015   Procedure: DILATATION & CURETTAGE/HYSTEROSCOPY WITH MYOSURE;  Surgeon: Truman Corona, MD;  Location: [facility];  Service: Gynecology;  Laterality: N/A;   DILATION AND CURETTAGE OF UTERUS     ENUCLEATION Right 03/16/2017   ENUCLEATION Right 03/16/2017   Procedure: ENUCLEATION RIGHT EYE;  Surgeon: Loyd Kathryne Palm, MD;  Location: [facility] OR;  Service: Ophthalmology;  Laterality: Right;   EYE SURGERY     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6, 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   EYE SURGERY Right ~ 1974   S/P initial eye injury; scissors stuck in my eye   LAPAROSCOPIC CHOLECYSTECTOMY     SHOULDER ARTHROSCOPY WITH ROTATOR CUFF REPAIR Left    wire stitches per patient   SHOULDER ARTHROSCOPY WITH ROTATOR CUFF REPAIR Right     Current Medications: Current Facility-Administered Medications  Medication Dose Route Frequency Provider Last Rate Last Admin   acetaminophen  (TYLENOL ) tablet 650 mg  650 mg Oral Q6H PRN Tex Drilling, NP   650 mg at 10/29/24 2126   alum & mag hydroxide-simeth (MAALOX/MYLANTA) 200-200-20 MG/5ML suspension 30 mL  30 mL Oral Q4H PRN Tex Drilling, NP       cloNIDine  (CATAPRES ) tablet 0.1 mg  0.1 mg Oral BID PRN Tex Drilling, NP       divalproex  (DEPAKOTE ) DR tablet 750 mg  750 mg Oral Q12H Helen Cuff, MD   750 mg at 10/30/24 9081   losartan  (COZAAR ) tablet 25 mg  25 mg Oral Daily Nkwenti, Doris, NP   25 mg at 10/30/24 9081   magnesium  hydroxide (MILK OF MAGNESIA) suspension 30 mL  30 mL Oral Daily PRN Tex Drilling, NP       methimazole  (TAPAZOLE ) tablet 10 mg  10 mg Oral Daily Nkwenti, Doris, NP   10 mg at 10/30/24 9080   OLANZapine  (ZYPREXA ) injection 5 mg  5 mg Intramuscular TID PRN Tex Drilling, NP       OLANZapine  (ZYPREXA ) injection 5 mg  5 mg Intramuscular TID PRN Tex Drilling, NP       OLANZapine  (ZYPREXA ) tablet 20 mg  20 mg Oral QHS Deiondre Harrower, MD   20 mg at 10/29/24 2118   OLANZapine  zydis (ZYPREXA ) disintegrating tablet 5 mg  5 mg Oral TID PRN Tex Drilling, NP  OLANZapine  zydis (ZYPREXA ) disintegrating tablet 5 mg  5 mg Oral TID PRN Tex Drilling, NP       pantoprazole  (PROTONIX ) EC tablet 40 mg  40 mg Oral Daily Tex Drilling, NP   40 mg at 10/30/24 0919   traZODone  (DESYREL ) tablet 50 mg  50 mg Oral QHS PRN Tex Drilling, NP   50 mg at 10/29/24 2118    Lab Results:  Results for orders placed or performed during the hospital encounter of 10/17/24 (from the past 48 hours)  Thyroid  Panel With TSH     Status: Abnormal   Collection Time: 10/29/24  7:12 AM  Result Value Ref Range   TSH <0.005 (L) 0.450 - 4.500 uIU/mL   T4, Total 5.8 4.5 - 12.0 ug/dL   T3 Uptake Ratio 30 24 - 39 %   Free Thyroxine Index 1.7 1.2 - 4.9    Comment: (NOTE) Performed At: [facility] [address] [place], [place] [id number] Jennette Shorter MD Ey:[id number]       Blood Alcohol level:  Lab Results  Component Value Date   [place] <15 10/14/2024   ETH <10 07/02/2022    Metabolic Disorder Labs: Lab Results  Component Value Date   HGBA1C 5.5 10/14/2024   MPG 111.15 10/14/2024   MPG 137 01/06/2022   Lab Results  Component Value Date   PROLACTIN 5.8 10/14/2024   Lab Results  Component Value Date   CHOL 167 10/14/2024   TRIG 68 10/14/2024   HDL 60 10/14/2024   CHOLHDL 2.8 10/14/2024   VLDL 14 10/14/2024   LDLCALC 93 10/14/2024   LDLCALC 73 03/13/2024    Physical Findings: AIMS:  , ,  ,  ,    CIWA:    COWS:      Psychiatric Specialty Exam:  Presentation   General Appearance:  Appropriate for Environment  Eye Contact: Fleeting  Speech: Normal Rate  Speech Volume: Decreased    Mood and Affect  Mood:fine  Affect: Flat   Thought Process  Thought Processes: Disorganized  Orientation:Partial  Thought Content:Illogical; Paranoid Ideation  Hallucinations: Denies  Ideas of Reference:Delusions; Paranoia  Suicidal Thoughts: Denies  Homicidal Thoughts: Denies   Sensorium  Memory: Immediate Fair; Recent Fair  Judgment: Impaired  Insight: Shallow   Executive Functions  Concentration: Fair  Attention Span: Fair  Recall: Poor  Fund of Knowledge: Fair  Language: Fair   Psychomotor Activity  Psychomotor Activity: No data recorded  Musculoskeletal: Strength & Muscle Tone: within normal limits Gait & Station: normal Assets  Assets: Manufacturing Systems Engineer; Desire for Improvement; Social Support    Physical Exam: Physical Exam Vitals and nursing note reviewed.    ROS Blood pressure (!) 162/75, pulse 80, temperature 98.5 F (36.9 C), resp. rate 20, height 5' 8 (1.727 m), weight 48.3 kg, SpO2 98%. Body mass index is 16.19 kg/m.  Diagnosis: Principal Problem:   Schizophrenia, paranoid (HCC)    Treatment Plan Summary:  Safety and Monitoring:             -- Involuntary admission to inpatient psychiatric unit for safety, stabilization and treatment             -- Daily contact with patient to assess and evaluate symptoms and progress in treatment             -- Patient's case to be discussed in multi-disciplinary team meeting             -- Observation Level: q15 minute checks             --  Vital signs:  q12 hours             -- Precautions: suicide, elopement, and assault   2. Psychiatric Diagnoses and Treatment:            Depakote   750 mg twice daily Zyprexa  to 15 mg nightly     -- The risks/benefits/side-effects/alternatives to this medication were discussed in detail with the  patient and time was given for questions. The patient consents to medication trial.                -- Metabolic profile and EKG monitoring obtained while on an atypical antipsychotic (BMI: Lipid Panel: HbgA1c: QTc:)              -- Encouraged patient to participate in unit milieu and in scheduled group therapies  4. Discharge Planning:   -- Social work and case management to assist with discharge planning and identification of hospital follow-up needs prior to discharge  -- Estimated LOS: 3-4 days  Allyn Foil, MD 10/30/2024, 12:11 PM

## 2024-10-30 NOTE — Plan of Care (Signed)
   Problem: Activity: Goal: Interest or engagement in activities will improve Outcome: Progressing Goal: Sleeping patterns will improve Outcome: Progressing

## 2024-10-30 NOTE — Plan of Care (Signed)

## 2024-10-30 NOTE — Progress Notes (Signed)
   10/30/24 1402  Psych Admission Type (Psych Patients Only)  Admission Status Involuntary  Psychosocial Assessment  Patient Complaints None  Eye Contact Brief  Facial Expression Flat  Affect Flat  Speech Soft  Interaction Minimal  Motor Activity Slow  Appearance/Hygiene In scrubs;Unremarkable  Behavior Characteristics Cooperative  Mood Pleasant  Thought Process  Coherency Circumstantial  Content Ambivalence  Delusions None reported or observed  Perception WDL  Hallucination None reported or observed  Judgment Impaired  Confusion Mild  Danger to Self  Current suicidal ideation? Denies  Description of Suicide Plan n/a  Agreement Not to Harm Self Yes  Description of Agreement verbal  Danger to Others  Danger to Others None reported or observed

## 2024-10-30 NOTE — Progress Notes (Signed)
   10/30/24 0544  15 Minute Checks  Location Bedroom  Visual Appearance Calm  Behavior Sleeping  Sleep (Behavioral Health Patients Only)  Calculate sleep? (Click Yes once per 24 hr at 0600 safety check) Yes  Documented sleep last 24 hours 8.75

## 2024-10-30 NOTE — Group Note (Signed)
 Date:  10/30/2024 Time:  8:42 PM  Group Topic/Focus:  Wrap-Up Group:   The focus of this group is to help patients review their daily goal of treatment and discuss progress on daily workbooks.    Participation Level:  Active  Participation Quality:  Appropriate  Affect:  Appropriate  Cognitive:  Alert and Appropriate  Insight: Appropriate and Good  Engagement in Group:  Engaged  Modes of Intervention:  Discussion  Additional Comments:    Sherrilyn JAYSON Redman 10/30/2024, 8:42 PM

## 2024-10-30 NOTE — Group Note (Signed)
 Date:  10/30/2024 Time:  10:56 AM  Group Topic/Focus:    Gratitude Gratitude is the act of recognizing and acknowledging the good things that happen, resulting in a state of appreciation [employer], 2010). Often when we consider what we are grateful for, overt and profound life experiences, circumstances, and events come to mind. Gratitude, thankfulness, or gratefulness is a feeling of appreciation by a recipient of another's kindness. This kindness can be gifts, help, favors, or another form of generosity to another person. Gratitude seems to reduce depression symptoms people with a grateful mindset report higher satisfaction with life, strong social relationships and more self-esteem than those who don't practice gratitude. But it's also possible that depressed people are less likely to practice gratitude.  Participation Level:  Did Not Attend  Harlene LITTIE Gavel 10/30/2024, 10:56 AM

## 2024-10-30 NOTE — Progress Notes (Incomplete)
   10/30/24 2100  Psych Admission Type (Psych Patients Only)  Admission Status Involuntary  Psychosocial Assessment  Patient Complaints None  Eye Contact Brief  Facial Expression Flat  Affect Flat  Speech Soft  Interaction Minimal  Motor Activity Slow  Appearance/Hygiene In scrubs;Unremarkable  Behavior Characteristics Cooperative;Appropriate to situation  Mood Pleasant  Thought Process  Coherency Circumstantial  Content Ambivalence  Delusions None reported or observed  Perception WDL  Hallucination None reported or observed  Judgment Impaired  Confusion Mild  Danger to Self  Current suicidal ideation? Denies  Agreement Not to Harm Self Yes  Description of Agreement verbal  Danger to Others  Danger to Others None reported or observed

## 2024-10-31 DIAGNOSIS — F200 Paranoid schizophrenia: Secondary | ICD-10-CM | POA: Diagnosis not present

## 2024-10-31 NOTE — Progress Notes (Signed)
 Behavior:   Pleasant and cooperative.   Psych assessment:  Denies SI/HI and AVH.    Interaction / Group attendance:  Present in the milieu.  Minimal interaction with peers and staff.  Attends most groups.  Medication/ PRNs: Compliant.  Pain: Denies.  15 min checks in place for safety.

## 2024-10-31 NOTE — Group Note (Signed)
 Date:  10/31/2024 Time:  11:24 AM  Group Topic/Focus:  Wellness Toolbox:   The focus of this group is to discuss various aspects of wellness, balancing those aspects and exploring ways to increase the ability to experience wellness.  Patients will create a wellness toolbox for use upon discharge.    Participation Level:  Active  Participation Quality:  Appropriate and Attentive  Affect:  Appropriate  Cognitive:  Alert and Appropriate  Insight: Appropriate  Engagement in Group:  Engaged  Modes of Intervention:  Activity and Discussion  Additional Comments:     Maglione,Carlisha Wisler E 10/31/2024, 11:24 AM

## 2024-10-31 NOTE — Group Note (Signed)
 Date:  10/31/2024 Time:  8:44 PM  Group Topic/Focus:  Making Healthy Choices:   The focus of this group is to help patients identify negative/unhealthy choices they were using prior to admission and identify positive/healthier coping strategies to replace them upon discharge.    Participation Level:  Active  Participation Quality:  Appropriate  Affect:  Appropriate  Cognitive:  Appropriate  Insight: Good  Engagement in Group:  Engaged  Modes of Intervention:  Discussion  Additional Comments:    Romero Earnie Hope 10/31/2024, 8:44 PM

## 2024-10-31 NOTE — Group Note (Signed)
 Date:  10/31/2024 Time:  3:17 PM  Group Topic/Focus:  Thanksgiving Football and conversation     Participation Level:  Did Not Attend    Shari [name] 10/31/2024, 3:17 PM

## 2024-10-31 NOTE — Progress Notes (Signed)
 [facility] MD Progress Note  10/31/2024 11:53 AM Shari [name]  MRN:  [id number] Patient is a 68 year old female with a history of Schizoaffective Disorder, Bipolar Type who presents via [facility] under IVC, initiated by CM, Falencio with re-entry program, to [facility] for assessment. Per IVC, [name] has been diagnosed with Schizophrenia and is refusing to take medication. [name] isn't sleeping. Residents report her screaming and yelling all night long.  [name] responding to internal stimuli.  [name] verbally aggressive.  The respondent has locked all of the residents inside of the transitional housing home.  They could not leave the home which prompted the IVC.  Residents did not have access to food, the restroom or anything as a result of being locked in.  [name] is currently under Adult protective services care. Patient is admitted to [facility] unit with Q15 min safety monitoring. Multidisciplinary team approach is offered. Medication management; group/milieu therapy is offered.   Subjective:  Chart reviewed, case discussed in multidisciplinary meeting, patient seen during rounds.  On interview today patient is noted to be participating in groups in the day area.  She came out to talk to the provider.  She offers no complaints.  She remains discharge focused.  She continues to reiterate the address of the residents that she claims being her house and spite of the APS worker's telling her that it is a transitional house.  Patient remains adamant that it is not transitional house and that the people deciding the air or intruders doing drugs and prostitution.  Provider discussed with APS is looking into the situation and will get back to us  early next week.  She denies SI/HI/plan and denies hallucinations.  Per nursing report patient continues to be mumbling to herself in her room and in the day area Past Psychiatric History: see h&P Family History:  Family History  Problem Relation  Age of Onset   Diabetes Mother    CAD Mother    Lung cancer Father    Social History:  Social History   Substance and Sexual Activity  Alcohol Use Yes   Comment: beer occasionally     Social History   Substance and Sexual Activity  Drug Use No    Social History   Socioeconomic History   Marital status: Widowed    Spouse name: Not on file   Number of children: Not on file   Years of education: Not on file   Highest education level: Not on file  Occupational History   Not on file  Tobacco Use   Smoking status: Every Day    Current packs/day: 0.50    Average packs/day: 0.5 packs/day for 40.0 years (20.0 ttl pk-yrs)    Types: Cigarettes   Smokeless tobacco: Never  Vaping Use   Vaping status: Never Used  Substance and Sexual Activity   Alcohol use: Yes    Comment: beer occasionally   Drug use: No   Sexual activity: Not Currently    Birth control/protection: Post-menopausal  Other Topics Concern   Not on file  Social History Narrative   Not on file   Social Drivers of Health   Financial Resource Strain: Not on file  Food Insecurity: No Food Insecurity (10/17/2024)   Hunger Vital Sign    Worried About Running Out of Food in the Last Year: Never true    Ran Out of Food in the Last Year: Never true  Transportation Needs: No Transportation Needs (10/17/2024)   PRAPARE -  Administrator, Civil Service (Medical): No    Lack of Transportation (Non-Medical): No  Recent Concern: Transportation Needs - Unmet Transportation Needs (10/14/2024)   PRAPARE - Administrator, Civil Service (Medical): Yes    Lack of Transportation (Non-Medical): Yes  Physical Activity: Not on file  Stress: Not on file  Social Connections: Unknown (10/17/2024)   Social Connection and Isolation Panel    Frequency of Communication with Friends and Family: Patient unable to answer    Frequency of Social Gatherings with Friends and Family: Patient unable to answer    Attends  Religious Services: Patient unable to answer    Active Member of Clubs or Organizations: Patient declined    Attends Banker Meetings: Patient unable to answer    Marital Status: Widowed   Past Medical History:  Past Medical History:  Diagnosis Date   Eye globe prosthesis    GERD (gastroesophageal reflux disease)    History of blood transfusion 1973   related to abscess burst in my stomach   Hyperlipemia    Hyperlipidemia    Hypertension    Osteoarthritis of back    Lowerback    SVD (spontaneous vaginal delivery)    x 1, baby died at 2 wks of age   Type II diabetes mellitus (HCC)     Past Surgical History:  Procedure Laterality Date   APPENDECTOMY     COLONOSCOPY     DILATATION & CURETTAGE/HYSTEROSCOPY WITH MYOSURE N/A 02/25/2015   Procedure: DILATATION & CURETTAGE/HYSTEROSCOPY WITH MYOSURE;  Surgeon: Truman Corona, MD;  Location: [facility];  Service: Gynecology;  Laterality: N/A;   DILATION AND CURETTAGE OF UTERUS     ENUCLEATION Right 03/16/2017   ENUCLEATION Right 03/16/2017   Procedure: ENUCLEATION RIGHT EYE;  Surgeon: Loyd Kathryne Palm, MD;  Location: [facility] OR;  Service: Ophthalmology;  Laterality: Right;   EYE SURGERY     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6, 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   EYE SURGERY Right ~ 1974   S/P initial eye injury; scissors stuck in my eye   LAPAROSCOPIC CHOLECYSTECTOMY     SHOULDER ARTHROSCOPY WITH ROTATOR CUFF REPAIR Left    wire stitches per patient   SHOULDER ARTHROSCOPY WITH ROTATOR CUFF REPAIR Right     Current Medications: Current Facility-Administered Medications  Medication Dose Route Frequency Provider Last Rate Last Admin   acetaminophen  (TYLENOL ) tablet 650 mg  650 mg Oral Q6H PRN Tex Drilling, NP   650 mg at 10/30/24 2111   alum & mag hydroxide-simeth (MAALOX/MYLANTA) 200-200-20 MG/5ML suspension 30 mL  30 mL Oral Q4H PRN Tex Drilling, NP       cloNIDine  (CATAPRES ) tablet 0.1 mg  0.1 mg Oral BID PRN Tex Drilling, NP       divalproex   (DEPAKOTE ) DR tablet 750 mg  750 mg Oral Q12H Shimshon Narula, MD   750 mg at 10/31/24 1024   losartan  (COZAAR ) tablet 25 mg  25 mg Oral Daily Nkwenti, Doris, NP   25 mg at 10/31/24 1024   magnesium  hydroxide (MILK OF MAGNESIA) suspension 30 mL  30 mL Oral Daily PRN Tex Drilling, NP       methimazole  (TAPAZOLE ) tablet 10 mg  10 mg Oral Daily Nkwenti, Doris, NP   10 mg at 10/31/24 1024   OLANZapine  (ZYPREXA ) injection 5 mg  5 mg Intramuscular TID PRN Tex Drilling, NP       OLANZapine  (ZYPREXA ) injection 5 mg  5 mg Intramuscular TID PRN  Tex Drilling, NP       OLANZapine  (ZYPREXA ) tablet 20 mg  20 mg Oral QHS Alishba Naples, MD   20 mg at 10/30/24 2111   OLANZapine  zydis (ZYPREXA ) disintegrating tablet 5 mg  5 mg Oral TID PRN Tex Drilling, NP       OLANZapine  zydis (ZYPREXA ) disintegrating tablet 5 mg  5 mg Oral TID PRN Tex Drilling, NP       pantoprazole  (PROTONIX ) EC tablet 40 mg  40 mg Oral Daily Nkwenti, Doris, NP   40 mg at 10/31/24 1024   traZODone  (DESYREL ) tablet 50 mg  50 mg Oral QHS PRN Tex Drilling, NP   50 mg at 10/30/24 2111    Lab Results:  No results found for this or any previous visit (from the past 48 hours).     Blood Alcohol level:  Lab Results  Component Value Date   [facility] <15 10/14/2024   ETH <10 07/02/2022    Metabolic Disorder Labs: Lab Results  Component Value Date   HGBA1C 5.5 10/14/2024   MPG 111.15 10/14/2024   MPG 137 01/06/2022   Lab Results  Component Value Date   PROLACTIN 5.8 10/14/2024   Lab Results  Component Value Date   CHOL 167 10/14/2024   TRIG 68 10/14/2024   HDL 60 10/14/2024   CHOLHDL 2.8 10/14/2024   VLDL 14 10/14/2024   LDLCALC 93 10/14/2024   LDLCALC 73 03/13/2024    Physical Findings: AIMS:  , ,  ,  ,    CIWA:    COWS:      Psychiatric Specialty Exam:  Presentation  General Appearance:  Appropriate for Environment  Eye Contact: Fleeting  Speech: Normal Rate  Speech Volume: Decreased    Mood  and Affect  Mood:fine  Affect: Flat   Thought Process  Thought Processes: Disorganized  Orientation:Partial  Thought Content:Illogical; Paranoid Ideation  Hallucinations: Denies  Ideas of Reference:Delusions; Paranoia  Suicidal Thoughts: Denies  Homicidal Thoughts: Denies   Sensorium  Memory: Immediate Fair; Recent Fair  Judgment: Impaired  Insight: Shallow   Executive Functions  Concentration: Fair  Attention Span: Fair  Recall: Poor  Fund of Knowledge: Fair  Language: Fair   Psychomotor Activity  Psychomotor Activity: No data recorded  Musculoskeletal: Strength & Muscle Tone: within normal limits Gait & Station: normal Assets  Assets: Manufacturing Systems Engineer; Desire for Improvement; Social Support    Physical Exam: Physical Exam Vitals and nursing note reviewed.    ROS Blood pressure 133/65, pulse 77, temperature (!) 97.3 F (36.3 C), resp. rate 20, height 5' 8 (1.727 m), weight 48.3 kg, SpO2 99%. Body mass index is 16.19 kg/m.  Diagnosis: Principal Problem:   Schizophrenia, paranoid (HCC)    Treatment Plan Summary:  Safety and Monitoring:             -- Involuntary admission to inpatient psychiatric unit for safety, stabilization and treatment             -- Daily contact with patient to assess and evaluate symptoms and progress in treatment             -- Patient's case to be discussed in multi-disciplinary team meeting             -- Observation Level: q15 minute checks             -- Vital signs:  q12 hours             -- Precautions: suicide, elopement, and assault  2. Psychiatric Diagnoses and Treatment:            Depakote   750 mg twice daily Zyprexa  to 15 mg nightly     -- The risks/benefits/side-effects/alternatives to this medication were discussed in detail with the patient and time was given for questions. The patient consents to medication trial.                -- Metabolic profile and EKG monitoring  obtained while on an atypical antipsychotic (BMI: Lipid Panel: HbgA1c: QTc:)              -- Encouraged patient to participate in unit milieu and in scheduled group therapies  4. Discharge Planning:   -- Social work and case management to assist with discharge planning and identification of hospital follow-up needs prior to discharge  -- Estimated LOS: 3-4 days  Allyn Foil, MD 10/31/2024, 11:53 AM

## 2024-10-31 NOTE — Plan of Care (Signed)
   Problem: Activity: Goal: Interest or engagement in activities will improve Outcome: Progressing   Problem: Coping: Goal: Ability to verbalize frustrations and anger appropriately will improve Outcome: Progressing

## 2024-10-31 NOTE — Group Note (Signed)
 LCSW Group Therapy Note   Group Date: 10/31/2024 Start Time: 1300 End Time: 1330   Type of Therapy and Topic:  Group Therapy: Challenging Core Beliefs  Participation Level:  Did Not Attend  Description of Group:  Patients were educated about core beliefs and asked to identify one harmful core belief that they have. Patients were asked to explore from where those beliefs originate. Patients were asked to discuss how those beliefs make them feel and the resulting behaviors of those beliefs. They were then be asked if those beliefs are true and, if so, what evidence they have to support them. Lastly, group members were challenged to replace those negative core beliefs with helpful beliefs.   Therapeutic Goals:   1. Patient will identify harmful core beliefs and explore the origins of such beliefs. 2. Patient will identify feelings and behaviors that result from those core beliefs. 3. Patient will discuss whether such beliefs are true. 4.  Patient will replace harmful core beliefs with helpful ones.  Summary of Patient Progress:  X  Therapeutic Modalities: Cognitive Behavioral Therapy; Solution-Focused Therapy   Christon Gallaway D Matisyn Cabeza, [place] 10/31/2024  1:34 PM

## 2024-10-31 NOTE — Group Note (Signed)
 Recreation Therapy Group Note   Group Topic:Communication  Group Date: 10/31/2024 Start Time: 1210 End Time: 1240 Facilitators: Celestia Jeoffrey FORBES ARTICE, CTRS Location: Craft Room  Group Description: Gratitude Conversations. Patients and LRT discussed what gratitude means, how we can express it and what it means to us , personally. LRT gave an educational handout on the definition of gratitude as well as the benefits that come from it. LRT and patients discussed different gratitude prompts and allowed patients to join in the guided discussion. LRT and pts processed how showing gratitude towards themselves, and others can be applied to everyday life post-discharge. LRT offered journals and folders to pts afterwards.   Goal Area(s) Addressed:  Patient will identify the definition of gratitude. Patient will learn different gratitude exercises. Patient will increase communication.  Patient will identify a new coping skill.   Affect/Mood: N/A   Participation Level: Did not attend    Clinical Observations/Individualized Feedback: Patient did not attend group.   Plan: Continue to engage patient in RT group sessions 2-3x/week.   Jeoffrey FORBES Celestia, LRT, CTRS 10/31/2024 1:06 PM

## 2024-10-31 NOTE — Progress Notes (Signed)
   10/30/24 1320  Spiritual Encounters  Type of Visit Declined chaplain visit   I attempted to visit with Shari [name] and invite her to join spirituality group.  She was resting and politely declined visit or to come to group. Will contnue to offer support.  Boleslaw Borghi L. Delores HERO.Div

## 2024-11-01 DIAGNOSIS — F200 Paranoid schizophrenia: Secondary | ICD-10-CM | POA: Diagnosis not present

## 2024-11-01 NOTE — Group Note (Signed)
 Date:  11/01/2024 Time:  10:33 AM  Group Topic/Focus:  Dimensions of Wellness:   The focus of this group is to introduce the topic of wellness and discuss the role each dimension of wellness plays in total health. Self Care:   The focus of this group is to help patients understand the importance of self-care in order to improve or restore emotional, physical, spiritual, interpersonal, and financial health.    Participation Level:  Did Not Attend  Participation Quality:  pt did not attend afternoon group.  Affect:  Appropriate  Cognitive:  Appropriate  Insight: Appropriate  Engagement in Group:  pt did not attend afternoon group.  Modes of Intervention:  pt did not attend group session.  Additional Comments:  Pt did not attend afternoon group session. Will to continue to motivate pt to attend group sessions with peers.  Brad GORMAN Ryder 11/01/2024, 10:33 AM

## 2024-11-01 NOTE — Progress Notes (Signed)
 Behavior: Pleasant and cooperative.  Soft spoken.   Psych assessment:  Denies SI/HI and AVH.    Interaction / Group attendance:  Present in the milieu.  Minimal interaction with peers and staff.  Attends groups.  Medication/ PRNs: Compliant. PRN pain medication given as ordered.   Pain: 7/10 shoulder  15 min checks in place for safety.

## 2024-11-01 NOTE — Group Note (Signed)
 Recreation Therapy Group Note   Group Topic:Leisure Education  Group Date: 11/01/2024 Start Time: 1215 End Time: 1300 Facilitators: Celestia Jeoffrey BRAVO, LRT, CTRS Location: Dayroom  Group Description: Bingo. Patients played multiple rounds of bingo. LRT and pts discussed the definition of leisure, things they do in their free time outside of the hospital, and how bingo is also a leisure activity. Pts received a journal or coloring book as their bingo prize.   Goal Area(s) Addressed:  Patient will identify a current leisure interest.  Patient will learn the definition of "leisure". Patient will have the opportunity to try a new leisure activity. Patient will communicate with peers and LRT.   Affect/Mood: N/A   Participation Level: Did not attend    Clinical Observations/Individualized Feedback: Patient did not attend group.   Plan: Continue to engage patient in RT group sessions 2-3x/week.   Jeoffrey BRAVO Celestia, LRT, CTRS 11/01/2024 1:58 PM

## 2024-11-01 NOTE — Group Note (Signed)
 Date:  11/01/2024 Time:  9:37 PM  Group Topic/Focus:  Coping With Mental Health Crisis:   The purpose of this group is to help patients identify strategies for coping with mental health crisis.  Group discusses possible causes of crisis and ways to manage them effectively.    Participation Level:  Minimal  Participation Quality:  Appropriate  Affect:  Appropriate  Cognitive:  Appropriate  Insight: Appropriate  Engagement in Group:  Engaged  Modes of Intervention:  Discussion  Additional Comments:    Shari [name] 11/01/2024, 9:37 PM

## 2024-11-01 NOTE — Plan of Care (Signed)
  Problem: Activity: Goal: Interest or engagement in activities will improve Outcome: Progressing   Problem: Coping: Goal: Ability to demonstrate self-control will improve Outcome: Progressing   Problem: Health Behavior/Discharge Planning: Goal: Compliance with treatment plan for underlying cause of condition will improve Outcome: Progressing   

## 2024-11-01 NOTE — Progress Notes (Addendum)
 The transfer of care for the patient was completed at the change of shift with the outgoing nurse. The patient was observed withdrawn to self in her room, and has limited interactions with peers. During the 1:1 assessment, the patient denied any suicidal ideation, self-injurious behavior, homicidal ideation, auditory or visual hallucinations, however, reported 7/10 pain on the shoulder. Patient receive PRN Tylenol  for pain with good effect. Patient was compliant with all scheduled medications. The patient appeared calm and cooperative, attending and participating in the evening groups. She described her mood as good,, congruent to her flat affect. No unsafe behaviors or concerns were noted. The patient slept throughout the shift, and safety checks, along with hourly rounding, were completed as per protocol.   11/01/24 0330  Psych Admission Type (Psych Patients Only)  Admission Status Involuntary  Psychosocial Assessment  Patient Complaints None  Eye Contact Brief  Facial Expression Flat  Affect Flat  Speech Soft  Interaction Minimal  Motor Activity Slow  Appearance/Hygiene Unremarkable  Behavior Characteristics Cooperative  Mood Pleasant  Thought Process  Coherency Circumstantial  Content Preoccupation  Delusions None reported or observed  Perception WDL  Hallucination None reported or observed  Judgment Impaired  Confusion Mild  Danger to Self  Current suicidal ideation? Denies  Agreement Not to Harm Self Yes  Description of Agreement Verbal  Danger to Others  Danger to Others None reported or observed

## 2024-11-01 NOTE — Progress Notes (Signed)
 [facility] MD Progress Note  11/01/2024 12:33 PM Shari [name]  MRN:  [id number] Patient is a 68 year old female with a history of Schizoaffective Disorder, Bipolar Type who presents via [employer] under IVC, initiated by CM, Falencio with re-entry program, to [facility] for assessment. Per IVC, [name] has been diagnosed with Schizophrenia and is refusing to take medication. [name] isn't sleeping. Residents report her screaming and yelling all night long.  [name] responding to internal stimuli.  [name] verbally aggressive.  The respondent has locked all of the residents inside of the transitional housing home.  They could not leave the home which prompted the IVC.  Residents did not have access to food, the restroom or anything as a result of being locked in.  [name] is currently under Adult protective services care. Patient is admitted to [facility] unit with Q15 min safety monitoring. Multidisciplinary team approach is offered. Medication management; group/milieu therapy is offered.   Subjective:  Chart reviewed, case discussed in multidisciplinary meeting, patient seen during rounds.  Patient is noted to be sitting in in the day area engaging with peers.  She offers no complaints.  She remains discharge focused and gets irritable when provider informs her that APS is involved to her housing situation as she continues to claim that is her house and not a transitional house.  Patient denies auditory/visual hallucinations but is noticed to be talking to herself at times in her room.  [name] taking her medications with no reported side effects.  She reports fair appetite and sleep Past Psychiatric History: see h&P Family History:  Family History  Problem Relation Age of Onset   Diabetes Mother    CAD Mother    Lung cancer Father    Social History:  Social History   Substance and Sexual Activity  Alcohol Use Yes   Comment: beer occasionally     Social History   Substance and  Sexual Activity  Drug Use No    Social History   Socioeconomic History   Marital status: Widowed    Spouse name: Not on file   Number of children: Not on file   Years of education: Not on file   Highest education level: Not on file  Occupational History   Not on file  Tobacco Use   Smoking status: Every Day    Current packs/day: 0.50    Average packs/day: 0.5 packs/day for 40.0 years (20.0 ttl pk-yrs)    Types: Cigarettes   Smokeless tobacco: Never  Vaping Use   Vaping status: Never Used  Substance and Sexual Activity   Alcohol use: Yes    Comment: beer occasionally   Drug use: No   Sexual activity: Not Currently    Birth control/protection: Post-menopausal  Other Topics Concern   Not on file  Social History Narrative   Not on file   Social Drivers of Health   Financial Resource Strain: Not on file  Food Insecurity: No Food Insecurity (10/17/2024)   Hunger Vital Sign    Worried About Running Out of Food in the Last Year: Never true    Ran Out of Food in the Last Year: Never true  Transportation Needs: No Transportation Needs (10/17/2024)   PRAPARE - Administrator, Civil Service (Medical): No    Lack of Transportation (Non-Medical): No  Recent Concern: Transportation Needs - Unmet Transportation Needs (10/14/2024)   PRAPARE - Administrator, Civil Service (Medical): Yes  Lack of Transportation (Non-Medical): Yes  Physical Activity: Not on file  Stress: Not on file  Social Connections: Unknown (10/17/2024)   Social Connection and Isolation Panel    Frequency of Communication with Friends and Family: Patient unable to answer    Frequency of Social Gatherings with Friends and Family: Patient unable to answer    Attends Religious Services: Patient unable to answer    Active Member of Clubs or Organizations: Patient declined    Attends Banker Meetings: Patient unable to answer    Marital Status: Widowed   Past Medical History:   Past Medical History:  Diagnosis Date   Eye globe prosthesis    GERD (gastroesophageal reflux disease)    History of blood transfusion 1973   related to abscess burst in my stomach   Hyperlipemia    Hyperlipidemia    Hypertension    Osteoarthritis of back    Lowerback    SVD (spontaneous vaginal delivery)    x 1, baby died at 2 wks of age   Type II diabetes mellitus (HCC)     Past Surgical History:  Procedure Laterality Date   APPENDECTOMY     COLONOSCOPY     DILATATION & CURETTAGE/HYSTEROSCOPY WITH MYOSURE N/A 02/25/2015   Procedure: DILATATION & CURETTAGE/HYSTEROSCOPY WITH MYOSURE;  Surgeon: Truman Corona, MD;  Location: [facility];  Service: Gynecology;  Laterality: N/A;   DILATION AND CURETTAGE OF UTERUS     ENUCLEATION Right 03/16/2017   ENUCLEATION Right 03/16/2017   Procedure: ENUCLEATION RIGHT EYE;  Surgeon: Loyd Kathryne Palm, MD;  Location: [facility] OR;  Service: Ophthalmology;  Laterality: Right;   EYE SURGERY     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6, 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   EYE SURGERY Right ~ 1974   S/P initial eye injury; scissors stuck in my eye   LAPAROSCOPIC CHOLECYSTECTOMY     SHOULDER ARTHROSCOPY WITH ROTATOR CUFF REPAIR Left    wire stitches per patient   SHOULDER ARTHROSCOPY WITH ROTATOR CUFF REPAIR Right     Current Medications: Current Facility-Administered Medications  Medication Dose Route Frequency Provider Last Rate Last Admin   acetaminophen  (TYLENOL ) tablet 650 mg  650 mg Oral Q6H PRN Tex Drilling, NP   650 mg at 11/01/24 0932   alum & mag hydroxide-simeth (MAALOX/MYLANTA) 200-200-20 MG/5ML suspension 30 mL  30 mL Oral Q4H PRN Nkwenti, Drilling, NP       cloNIDine  (CATAPRES ) tablet 0.1 mg  0.1 mg Oral BID PRN Tex Drilling, NP       divalproex  (DEPAKOTE ) DR tablet 750 mg  750 mg Oral Q12H Rilley Stash, MD   750 mg at 11/01/24 9071   losartan  (COZAAR ) tablet 25 mg  25 mg Oral Daily Nkwenti, Doris, NP   25 mg at 11/01/24 9071   magnesium  hydroxide (MILK OF MAGNESIA)  suspension 30 mL  30 mL Oral Daily PRN Tex Drilling, NP       methimazole  (TAPAZOLE ) tablet 10 mg  10 mg Oral Daily Nkwenti, Doris, NP   10 mg at 11/01/24 9071   OLANZapine  (ZYPREXA ) injection 5 mg  5 mg Intramuscular TID PRN Tex Drilling, NP       OLANZapine  (ZYPREXA ) injection 5 mg  5 mg Intramuscular TID PRN Tex Drilling, NP       OLANZapine  (ZYPREXA ) tablet 20 mg  20 mg Oral QHS Nerea Bordenave, MD   20 mg at 10/31/24 2125   OLANZapine  zydis (ZYPREXA ) disintegrating tablet 5 mg  5 mg Oral TID  PRN Tex Drilling, NP       OLANZapine  zydis (ZYPREXA ) disintegrating tablet 5 mg  5 mg Oral TID PRN Tex Drilling, NP       pantoprazole  (PROTONIX ) EC tablet 40 mg  40 mg Oral Daily Nkwenti, Doris, NP   40 mg at 11/01/24 9071   traZODone  (DESYREL ) tablet 50 mg  50 mg Oral QHS PRN Tex Drilling, NP   50 mg at 10/31/24 2124    Lab Results:  No results found for this or any previous visit (from the past 48 hours).     Blood Alcohol level:  Lab Results  Component Value Date   [facility] <15 10/14/2024   ETH <10 07/02/2022    Metabolic Disorder Labs: Lab Results  Component Value Date   HGBA1C 5.5 10/14/2024   MPG 111.15 10/14/2024   MPG 137 01/06/2022   Lab Results  Component Value Date   PROLACTIN 5.8 10/14/2024   Lab Results  Component Value Date   CHOL 167 10/14/2024   TRIG 68 10/14/2024   HDL 60 10/14/2024   CHOLHDL 2.8 10/14/2024   VLDL 14 10/14/2024   LDLCALC 93 10/14/2024   LDLCALC 73 03/13/2024    Physical Findings: AIMS:  , ,  ,  ,    CIWA:    COWS:      Psychiatric Specialty Exam:  Presentation  General Appearance:  Appropriate for Environment  Eye Contact: Fleeting  Speech: Normal Rate  Speech Volume: Decreased    Mood and Affect  Mood:fine  Affect: Flat   Thought Process  Thought Processes: Disorganized  Orientation:Partial  Thought Content:Illogical; Paranoid Ideation  Hallucinations: Denies  Ideas of Reference:Delusions;  Paranoia  Suicidal Thoughts: Denies  Homicidal Thoughts: Denies   Sensorium  Memory: Immediate Fair; Recent Fair  Judgment: Impaired  Insight: Shallow   Executive Functions  Concentration: Fair  Attention Span: Fair  Recall: Poor  Fund of Knowledge: Fair  Language: Fair   Psychomotor Activity  Psychomotor Activity: No data recorded  Musculoskeletal: Strength & Muscle Tone: within normal limits Gait & Station: normal Assets  Assets: Manufacturing Systems Engineer; Desire for Improvement; Social Support    Physical Exam: Physical Exam Vitals and nursing note reviewed.    ROS Blood pressure (!) 140/72, pulse 93, temperature (!) 97.2 F (36.2 C), resp. rate 18, height 5' 8 (1.727 m), weight 48.3 kg, SpO2 100%. Body mass index is 16.19 kg/m.  Diagnosis: Principal Problem:   Schizophrenia, paranoid (HCC)    Treatment Plan Summary:  Safety and Monitoring:             -- Involuntary admission to inpatient psychiatric unit for safety, stabilization and treatment             -- Daily contact with patient to assess and evaluate symptoms and progress in treatment             -- Patient's case to be discussed in multi-disciplinary team meeting             -- Observation Level: q15 minute checks             -- Vital signs:  q12 hours             -- Precautions: suicide, elopement, and assault   2. Psychiatric Diagnoses and Treatment:            Depakote   750 mg twice daily Zyprexa  to 15 mg nightly     -- The risks/benefits/side-effects/alternatives to this medication were discussed in  detail with the patient and time was given for questions. The patient consents to medication trial.                -- Metabolic profile and EKG monitoring obtained while on an atypical antipsychotic (BMI: Lipid Panel: HbgA1c: QTc:)              -- Encouraged patient to participate in unit milieu and in scheduled group therapies  4. Discharge Planning:   -- Social work and case  management to assist with discharge planning and identification of hospital follow-up needs prior to discharge  -- Estimated LOS: 3-4 days  Allyn Foil, MD 11/01/2024, 12:33 PM

## 2024-11-01 NOTE — Plan of Care (Signed)

## 2024-11-01 NOTE — Group Note (Deleted)
 Recreation Therapy Group Note   Group Topic:Leisure Education  Group Date: 11/01/2024 Start Time: 1120 End Time: 1200 Facilitators: Celestia Jeoffrey BRAVO, LRT Location: Craft Room  Group Description: Leisure. Patients were given the option to choose from journaling, coloring, drawing, making origami, playing with playdoh, listening to music or singing karaoke. LRT and pts discussed the meaning of leisure, the importance of participating in leisure during their free time/when they're outside of the hospital, as well as how our leisure interests can also serve as coping skills.   Goal Area(s) Addressed:  Patient will identify a current leisure interest.  Patient will learn the definition of "leisure". Patient will practice making a positive decision. Patient will have the opportunity to try a new leisure activity. Patient will communicate with peers and LRT.      Affect/Mood: {RT BHH Affect/Mood:26271}   Participation Level: {RT BHH Participation Level:26267}   Participation Quality: {RT BHH Participation Quality:26268}   Behavior: {RT BHH Group Behavior:26269}   Speech/Thought Process: {RT BHH Speech/Thought:26276}   Insight: {RT BHH Insight:26272}   Judgement: {RT BHH Judgement:26278}   Modes of Intervention: {RT BHH Modes of Intervention:26277}   Patient Response to Interventions:  {RT BHH Patient Response to Intervention:26274}   Education Outcome:  {RT BHH Education Outcome:26279}   Clinical Observations/Individualized Feedback: *** was *** in their participation of session activities and group discussion. Pt identified ***   Plan: {RT BHH Tx Eojw:73719}   Jeoffrey BRAVO Celestia, LRT,  11/01/2024 1:30 PM

## 2024-11-02 ENCOUNTER — Encounter: Payer: Self-pay | Admitting: Psychiatry

## 2024-11-02 DIAGNOSIS — F200 Paranoid schizophrenia: Secondary | ICD-10-CM | POA: Diagnosis not present

## 2024-11-02 NOTE — Progress Notes (Signed)
   11/02/24 1000  Psych Admission Type (Psych Patients Only)  Admission Status Involuntary  Psychosocial Assessment  Patient Complaints None  Eye Contact Fair  Facial Expression Flat  Affect Flat  Speech Soft  Interaction Minimal  Motor Activity Slow  Appearance/Hygiene Unremarkable  Behavior Characteristics Cooperative  Mood Pleasant  Thought Process  Coherency WDL  Content WDL  Delusions None reported or observed  Perception WDL  Hallucination None reported or observed  Judgment Impaired  Confusion Mild  Danger to Self  Current suicidal ideation? Denies  Danger to Others  Danger to Others None reported or observed

## 2024-11-02 NOTE — Group Note (Signed)
 Date:  11/02/2024 Time:  11:52 AM  Group Topic/Focus:  Coping With Mental Health Crisis:   The purpose of this group is to help patients identify strategies for coping with mental health crisis.  Group discusses possible causes of crisis and ways to manage them effectively. Conflict Resolution:   The focus of this group is to discuss the conflict resolution process and how it may be used upon discharge. Healthy Communication:   The focus of this group is to discuss communication, barriers to communication, as well as healthy ways to communicate with others.    Participation Level:  Did Not Attend  Participation Quality:    Affect:    Cognitive:    Insight:   Engagement in Group:    Modes of Intervention:   Additional Comments:   Kenji Mapel L Lorrie Gargan 11/02/2024, 11:52 AM

## 2024-11-02 NOTE — Plan of Care (Signed)

## 2024-11-02 NOTE — Group Note (Signed)
 Date:  11/02/2024 Time:  11:10 PM  Group Topic/Focus:  Healthy Communication:   The focus of this group is to discuss communication, barriers to communication, as well as healthy ways to communicate with others.    Participation Level:  Active  Participation Quality:  Appropriate  Affect:  Appropriate  Cognitive:  Appropriate  Insight: Appropriate  Engagement in Group:  Engaged  Modes of Intervention:  Discussion  Additional Comments:    Shari [name] 11/02/2024, 11:10 PM

## 2024-11-02 NOTE — Progress Notes (Signed)
 [facility] MD Progress Note  11/02/2024 1:59 PM MAKENLEIGH CROWNOVER  MRN:  [id number] Patient is a 68 year old female with a history of Schizoaffective Disorder, Bipolar Type who presents via G[employer] under IVC, initiated by CM, Falencio with re-entry program, to [facility] for assessment. Per IVC, [name] has been diagnosed with Schizophrenia and is refusing to take medication. [name] isn't sleeping. Residents report her screaming and yelling all night long.  [name] responding to internal stimuli.  [name] verbally aggressive.  The respondent has locked all of the residents inside of the transitional housing home.  They could not leave the home which prompted the IVC.  Residents did not have access to food, the restroom or anything as a result of being locked in.  [name] is currently under Adult protective services care. Patient is admitted to [facility] unit with Q15 min safety monitoring. Multidisciplinary team approach is offered. Medication management; group/milieu therapy is offered.   Subjective:  Chart reviewed, case discussed in multidisciplinary meeting, patient seen during rounds.  Patient is noted to be sitting in the day area.  She offers no complaints.  She remains discharge focused.  She reports fair appetite and sleep.  [name] taking her medications with no reported side effects.  [name] participating in groups Past Psychiatric History: see h&P Family History:  Family History  Problem Relation Age of Onset   Diabetes Mother    CAD Mother    Lung cancer Father    Social History:  Social History   Substance and Sexual Activity  Alcohol Use Yes   Comment: beer occasionally     Social History   Substance and Sexual Activity  Drug Use No    Social History   Socioeconomic History   Marital status: Widowed    Spouse name: Not on file   Number of children: Not on file   Years of education: Not on file   Highest education level: Not on file  Occupational  History   Not on file  Tobacco Use   Smoking status: Every Day    Current packs/day: 0.50    Average packs/day: 0.5 packs/day for 40.0 years (20.0 ttl pk-yrs)    Types: Cigarettes   Smokeless tobacco: Never  Vaping Use   Vaping status: Never Used  Substance and Sexual Activity   Alcohol use: Yes    Comment: beer occasionally   Drug use: No   Sexual activity: Not Currently    Birth control/protection: Post-menopausal  Other Topics Concern   Not on file  Social History Narrative   Not on file   Social Drivers of Health   Financial Resource Strain: Not on file  Food Insecurity: No Food Insecurity (10/17/2024)   Hunger Vital Sign    Worried About Running Out of Food in the Last Year: Never true    Ran Out of Food in the Last Year: Never true  Transportation Needs: No Transportation Needs (10/17/2024)   PRAPARE - Administrator, Civil Service (Medical): No    Lack of Transportation (Non-Medical): No  Recent Concern: Transportation Needs - Unmet Transportation Needs (10/14/2024)   PRAPARE - Administrator, Civil Service (Medical): Yes    Lack of Transportation (Non-Medical): Yes  Physical Activity: Not on file  Stress: Not on file  Social Connections: Unknown (10/17/2024)   Social Connection and Isolation Panel    Frequency of Communication with Friends and Family: Patient unable to answer  Frequency of Social Gatherings with Friends and Family: Patient unable to answer    Attends Religious Services: Patient unable to answer    Active Member of Clubs or Organizations: Patient declined    Attends Banker Meetings: Patient unable to answer    Marital Status: Widowed   Past Medical History:  Past Medical History:  Diagnosis Date   Eye globe prosthesis    GERD (gastroesophageal reflux disease)    History of blood transfusion 1973   related to abscess burst in my stomach   Hyperlipemia    Hyperlipidemia    Hypertension     Osteoarthritis of back    Lowerback    SVD (spontaneous vaginal delivery)    x 1, baby died at 2 wks of age   Type II diabetes mellitus (HCC)     Past Surgical History:  Procedure Laterality Date   APPENDECTOMY     COLONOSCOPY     DILATATION & CURETTAGE/HYSTEROSCOPY WITH MYOSURE N/A 02/25/2015   Procedure: DILATATION & CURETTAGE/HYSTEROSCOPY WITH MYOSURE;  Surgeon: Truman Corona, MD;  Location: [facility];  Service: Gynecology;  Laterality: N/A;   DILATION AND CURETTAGE OF UTERUS     ENUCLEATION Right 03/16/2017   ENUCLEATION Right 03/16/2017   Procedure: ENUCLEATION RIGHT EYE;  Surgeon: Loyd Kathryne Palm, MD;  Location: [facility] OR;  Service: Ophthalmology;  Laterality: Right;   EYE SURGERY     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6, 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   EYE SURGERY Right ~ 1974   S/P initial eye injury; scissors stuck in my eye   LAPAROSCOPIC CHOLECYSTECTOMY     SHOULDER ARTHROSCOPY WITH ROTATOR CUFF REPAIR Left    wire stitches per patient   SHOULDER ARTHROSCOPY WITH ROTATOR CUFF REPAIR Right     Current Medications: Current Facility-Administered Medications  Medication Dose Route Frequency Provider Last Rate Last Admin   acetaminophen  (TYLENOL ) tablet 650 mg  650 mg Oral Q6H PRN Tex Drilling, NP   650 mg at 11/02/24 0848   alum & mag hydroxide-simeth (MAALOX/MYLANTA) 200-200-20 MG/5ML suspension 30 mL  30 mL Oral Q4H PRN Tex Drilling, NP       cloNIDine  (CATAPRES ) tablet 0.1 mg  0.1 mg Oral BID PRN Tex Drilling, NP       divalproex  (DEPAKOTE ) DR tablet 750 mg  750 mg Oral Q12H Dollene Mallery, MD   750 mg at 11/02/24 0848   losartan  (COZAAR ) tablet 25 mg  25 mg Oral Daily Nkwenti, Doris, NP   25 mg at 11/02/24 0848   magnesium  hydroxide (MILK OF MAGNESIA) suspension 30 mL  30 mL Oral Daily PRN Tex Drilling, NP       methimazole  (TAPAZOLE ) tablet 10 mg  10 mg Oral Daily Nkwenti, Doris, NP   10 mg at 11/02/24 0848   OLANZapine  (ZYPREXA ) injection 5 mg  5 mg Intramuscular TID PRN Tex Drilling,  NP       OLANZapine  (ZYPREXA ) injection 5 mg  5 mg Intramuscular TID PRN Tex Drilling, NP       OLANZapine  (ZYPREXA ) tablet 20 mg  20 mg Oral QHS Charina Fons, MD   20 mg at 11/01/24 2111   OLANZapine  zydis (ZYPREXA ) disintegrating tablet 5 mg  5 mg Oral TID PRN Tex Drilling, NP       OLANZapine  zydis (ZYPREXA ) disintegrating tablet 5 mg  5 mg Oral TID PRN Tex Drilling, NP       pantoprazole  (PROTONIX ) EC tablet 40 mg  40 mg Oral Daily Nkwenti, Doris,  NP   40 mg at 11/02/24 0848   traZODone  (DESYREL ) tablet 50 mg  50 mg Oral QHS PRN Tex Drilling, NP   50 mg at 11/01/24 2111    Lab Results:  No results found for this or any previous visit (from the past 48 hours).     Blood Alcohol level:  Lab Results  Component Value Date   [facility] <15 10/14/2024   ETH <10 07/02/2022    Metabolic Disorder Labs: Lab Results  Component Value Date   HGBA1C 5.5 10/14/2024   MPG 111.15 10/14/2024   MPG 137 01/06/2022   Lab Results  Component Value Date   PROLACTIN 5.8 10/14/2024   Lab Results  Component Value Date   CHOL 167 10/14/2024   TRIG 68 10/14/2024   HDL 60 10/14/2024   CHOLHDL 2.8 10/14/2024   VLDL 14 10/14/2024   LDLCALC 93 10/14/2024   LDLCALC 73 03/13/2024    Physical Findings: AIMS:  , ,  ,  ,    CIWA:    COWS:      Psychiatric Specialty Exam:  Presentation  General Appearance:  Appropriate for Environment  Eye Contact: Fleeting  Speech: Normal Rate  Speech Volume: Decreased    Mood and Affect  Mood:fine  Affect: Flat   Thought Process  Thought Processes: Disorganized  Orientation:Partial  Thought Content:Illogical; Paranoid Ideation  Hallucinations: Denies  Ideas of Reference:Delusions; Paranoia  Suicidal Thoughts: Denies  Homicidal Thoughts: Denies   Sensorium  Memory: Immediate Fair; Recent Fair  Judgment: Impaired  Insight: Shallow   Executive Functions  Concentration: Fair  Attention  Span: Fair  Recall: Poor  Fund of Knowledge: Fair  Language: Fair   Psychomotor Activity  Psychomotor Activity: No data recorded  Musculoskeletal: Strength & Muscle Tone: within normal limits Gait & Station: normal Assets  Assets: Manufacturing Systems Engineer; Desire for Improvement; Social Support    Physical Exam: Physical Exam Vitals and nursing note reviewed.    ROS Blood pressure 135/67, pulse 93, temperature 98.2 F (36.8 C), resp. rate 16, height 5' 8 (1.727 m), weight 48.3 kg, SpO2 100%. Body mass index is 16.19 kg/m.  Diagnosis: Principal Problem:   Schizophrenia, paranoid (HCC)    Treatment Plan Summary:  Safety and Monitoring:             -- Involuntary admission to inpatient psychiatric unit for safety, stabilization and treatment             -- Daily contact with patient to assess and evaluate symptoms and progress in treatment             -- Patient's case to be discussed in multi-disciplinary team meeting             -- Observation Level: q15 minute checks             -- Vital signs:  q12 hours             -- Precautions: suicide, elopement, and assault   2. Psychiatric Diagnoses and Treatment:            Depakote   750 mg twice daily Zyprexa  to 15 mg nightly     -- The risks/benefits/side-effects/alternatives to this medication were discussed in detail with the patient and time was given for questions. The patient consents to medication trial.                -- Metabolic profile and EKG monitoring obtained while on an atypical antipsychotic (BMI: Lipid Panel: HbgA1c:  QTc:)              -- Encouraged patient to participate in unit milieu and in scheduled group therapies  4. Discharge Planning:   -- Social work and case management to assist with discharge planning and identification of hospital follow-up needs prior to discharge  -- Estimated LOS: 3-4 days  Allyn Foil, MD 11/02/2024, 1:59 PM

## 2024-11-03 DIAGNOSIS — F200 Paranoid schizophrenia: Secondary | ICD-10-CM | POA: Diagnosis not present

## 2024-11-03 NOTE — Group Note (Signed)
 Date:  11/03/2024 Time:  10:56 AM  Group Topic/Focus:  Goals/Boundaries Group:   The focus of this group is to help patients establish daily goals to achieve during treatment and discuss how the patient can incorporate goal setting into their daily lives to aide in recovery. Patients also learned the different boundaries types and discussed which categories that applied to them.     Participation Level:  Did Not Attend  Shari [name] 11/03/2024, 10:56 AM

## 2024-11-03 NOTE — BH IP Treatment Plan (Signed)
 Interdisciplinary Treatment and Diagnostic Plan Update  11/03/2024 Time of Session: 3:00 PM Shari [name] MRN: [id number]  Principal Diagnosis: Schizophrenia, paranoid (HCC)  Secondary Diagnoses: Principal Problem:   Schizophrenia, paranoid (HCC)   Current Medications:  Current Facility-Administered Medications  Medication Dose Route Frequency Provider Last Rate Last Admin   acetaminophen  (TYLENOL ) tablet 650 mg  650 mg Oral Q6H PRN Tex Drilling, NP   650 mg at 11/02/24 0848   alum & mag hydroxide-simeth (MAALOX/MYLANTA) 200-200-20 MG/5ML suspension 30 mL  30 mL Oral Q4H PRN Tex Drilling, NP       cloNIDine  (CATAPRES ) tablet 0.1 mg  0.1 mg Oral BID PRN Tex Drilling, NP       divalproex  (DEPAKOTE ) DR tablet 750 mg  750 mg Oral Q12H Jadapalle, Sree, MD   750 mg at 11/02/24 2113   losartan  (COZAAR ) tablet 25 mg  25 mg Oral Daily Nkwenti, Doris, NP   25 mg at 11/02/24 0848   magnesium  hydroxide (MILK OF MAGNESIA) suspension 30 mL  30 mL Oral Daily PRN Tex Drilling, NP       methimazole  (TAPAZOLE ) tablet 10 mg  10 mg Oral Daily Nkwenti, Doris, NP   10 mg at 11/02/24 0848   OLANZapine  (ZYPREXA ) injection 5 mg  5 mg Intramuscular TID PRN Tex Drilling, NP       OLANZapine  (ZYPREXA ) injection 5 mg  5 mg Intramuscular TID PRN Tex Drilling, NP       OLANZapine  (ZYPREXA ) tablet 20 mg  20 mg Oral QHS Jadapalle, Sree, MD   20 mg at 11/02/24 2113   OLANZapine  zydis (ZYPREXA ) disintegrating tablet 5 mg  5 mg Oral TID PRN Tex Drilling, NP       OLANZapine  zydis (ZYPREXA ) disintegrating tablet 5 mg  5 mg Oral TID PRN Tex Drilling, NP       pantoprazole  (PROTONIX ) EC tablet 40 mg  40 mg Oral Daily Nkwenti, Doris, NP   40 mg at 11/02/24 0848   traZODone  (DESYREL ) tablet 50 mg  50 mg Oral QHS PRN Tex Drilling, NP   50 mg at 11/01/24 2111   PTA Medications: Medications Prior to Admission  Medication Sig Dispense Refill Last Dose/Taking   Ascorbic Acid (VITAMIN C PO) Take 1 tablet by  mouth daily. (Patient not taking: Reported on 10/14/2024)      divalproex  (DEPAKOTE ) 500 MG DR tablet Take 1 tablet (500 mg total) by mouth at bedtime. (Patient not taking: Reported on 10/14/2024) 30 tablet 1    losartan  (COZAAR ) 25 MG tablet Take 1 tablet (25 mg total) by mouth daily. (Patient not taking: Reported on 10/14/2024) 90 tablet 1    methimazole  (TAPAZOLE ) 10 MG tablet Take 1 tablet (10 mg total) by mouth daily. (Patient not taking: Reported on 10/14/2024) 90 tablet 1    OLANZapine  (ZYPREXA ) 10 MG tablet Take 1 tablet (10 mg total) by mouth at bedtime. (Patient not taking: Reported on 10/14/2024) 30 tablet 1    pantoprazole  (PROTONIX ) 40 MG tablet Take 1 tablet (40 mg total) by mouth daily. (Patient not taking: Reported on 10/14/2024) 90 tablet 0    propranolol  (INDERAL ) 10 MG tablet Take 1 tablet (10 mg total) by mouth 2 (two) times daily. (Patient not taking: Reported on 10/14/2024) 180 tablet 1    VITAMIN D PO Take 1 tablet by mouth daily. (Patient not taking: Reported on 10/14/2024)      VITAMIN E PO Take 1 tablet by mouth daily. (Patient not taking: Reported on 10/14/2024)  Patient Stressors: Medication change or noncompliance    Patient Strengths: Communication skills   Treatment Modalities: Medication Management, Group therapy, Case management,  1 to 1 session with clinician, Psychoeducation, Recreational therapy.   Physician Treatment Plan for Primary Diagnosis: Schizophrenia, paranoid (HCC) Long Term Goal(s): Improvement in symptoms so as ready for discharge   Short Term Goals: Ability to identify changes in lifestyle to reduce recurrence of condition will improve Ability to verbalize feelings will improve Ability to disclose and discuss suicidal ideas Ability to demonstrate self-control will improve Ability to identify and develop effective coping behaviors will improve Ability to maintain clinical measurements within normal limits will improve  Medication  Management: Evaluate patient's response, side effects, and tolerance of medication regimen.  Therapeutic Interventions: 1 to 1 sessions, Unit Group sessions and Medication administration.  Evaluation of Outcomes: Not Progressing  Physician Treatment Plan for Secondary Diagnosis: Principal Problem:   Schizophrenia, paranoid (HCC)  Long Term Goal(s): Improvement in symptoms so as ready for discharge   Short Term Goals: Ability to identify changes in lifestyle to reduce recurrence of condition will improve Ability to verbalize feelings will improve Ability to disclose and discuss suicidal ideas Ability to demonstrate self-control will improve Ability to identify and develop effective coping behaviors will improve Ability to maintain clinical measurements within normal limits will improve     Medication Management: Evaluate patient's response, side effects, and tolerance of medication regimen.  Therapeutic Interventions: 1 to 1 sessions, Unit Group sessions and Medication administration.  Evaluation of Outcomes: Not Progressing   RN Treatment Plan for Primary Diagnosis: Schizophrenia, paranoid (HCC) Long Term Goal(s): Knowledge of disease and therapeutic regimen to maintain health will improve  Short Term Goals: Ability to remain free from injury will improve, Ability to verbalize frustration and anger appropriately will improve, Ability to demonstrate self-control, Ability to participate in decision making will improve, Ability to verbalize feelings will improve, Ability to disclose and discuss suicidal ideas, Ability to identify and develop effective coping behaviors will improve, and Compliance with prescribed medications will improve  Medication Management: RN will administer medications as ordered by provider, will assess and evaluate patient's response and provide education to patient for prescribed medication. RN will report any adverse and/or side effects to prescribing  provider.  Therapeutic Interventions: 1 on 1 counseling sessions, Psychoeducation, Medication administration, Evaluate responses to treatment, Monitor vital signs and CBGs as ordered, Perform/monitor CIWA, COWS, AIMS and Fall Risk screenings as ordered, Perform wound care treatments as ordered.  Evaluation of Outcomes: Not Progressing   LCSW Treatment Plan for Primary Diagnosis: Schizophrenia, paranoid (HCC) Long Term Goal(s): Safe transition to appropriate next level of care at discharge, Engage patient in therapeutic group addressing interpersonal concerns.  Short Term Goals: Engage patient in aftercare planning with referrals and resources, Increase social support, Increase ability to appropriately verbalize feelings, Increase emotional regulation, Facilitate acceptance of mental health diagnosis and concerns, Facilitate patient progression through stages of change regarding substance use diagnoses and concerns, Identify triggers associated with mental health/substance abuse issues, and Increase skills for wellness and recovery  Therapeutic Interventions: Assess for all discharge needs, 1 to 1 time with Social worker, Explore available resources and support systems, Assess for adequacy in community support network, Educate family and significant other(s) on suicide prevention, Complete Psychosocial Assessment, Interpersonal group therapy.  Evaluation of Outcomes: Not Progressing   Progress in Treatment: Attending groups: Yes. and No.  Participating in groups: Yes. and No.  Taking medication as prescribed: Yes.  Toleration medication: Yes.  Family/Significant other contact made: No, will contact:  if given permission.  Patient understands diagnosis: No.  Discussing patient identified problems/goals with staff: No.   Denies suicidal/homicidal ideation: Yes.  Issues/concerns per patient self-inventory: No. Other: none.    New problem(s) identified: No, Describe:  None identified 10/23/24  Update: No changes at this time. 10/28/24 Update: No changes at this time. Update 11/03/24: No changes at this time   New Short Term/Long Term Goal(s): elimination of symptoms of psychosis, medication management for mood stabilization; elimination of SI thoughts; development of comprehensive mental wellness plan. 10/23/24 Update: No changes at this time. 10/28/24 Update: No changes at this time. Update 11/03/24: No changes at this time   Patient Goals:  I haven't set any goals for the hospital yet. I don't need psychiatric help 10/23/24 Update: No changes at this time. 10/28/24 Update: No changes at this time. Update 11/03/24: No changes at this time   Discharge Plan or Barriers: CSW will assist with appropriate discharge plan 10/23/24 Update: No changes at this time. 10/28/24 Update: CSW to continue to meet with DSS to engage in safe discharge planning and connect patient with appropriate resources. Update 11/03/24: No changes at this time   Reason for Continuation of Hospitalization: Delusions  Medication stabilization 10/28/24 Update: Delusions  Medication stabilization    Estimated Length of Stay: 1 to 7 days 10/23/24 Update: No changes at this time. 10/28/24 Update: TBD. Update 11/03/24: TBD  Last 3 [place] Suicide Severity Risk Score: Flowsheet Row Admission (Current) from 10/17/2024 in [facility] ED from 10/14/2024 in [facility] ED to Hosp-Admission (Discharged) from 03/13/2024 in [place] [facility]  C-SSRS RISK CATEGORY No Risk No Risk No Risk    Last PHQ 2/9 Scores:     No data to display          Scribe for Treatment Team: [name], ISRAEL 11/03/2024 6:42 AM

## 2024-11-03 NOTE — Group Note (Signed)
 Date:  11/03/2024 Time:  9:19 PM  Group Topic/Focus:  Wrap-Up Group:   The focus of this group is to help patients review their daily goal of treatment and discuss progress on daily workbooks.    Participation Level:  Active  Participation Quality:  Appropriate  Affect:  Appropriate  Cognitive:  Alert  Insight: Appropriate  Engagement in Group:  Engaged  Modes of Intervention:  Discussion  Additional Comments:    Shari [name] 11/03/2024, 9:19 PM

## 2024-11-03 NOTE — Progress Notes (Signed)
   11/02/24 2329  Psych Admission Type (Psych Patients Only)  Admission Status Involuntary  Psychosocial Assessment  Patient Complaints None  Eye Contact Fair  Facial Expression Flat  Affect Flat  Speech Soft  Interaction Minimal  Motor Activity Slow  Appearance/Hygiene Unremarkable  Behavior Characteristics Cooperative  Mood Pleasant  Thought Process  Coherency WDL  Content WDL  Delusions None reported or observed  Perception WDL  Hallucination None reported or observed  Judgment Impaired  Confusion Mild  Danger to Self  Current suicidal ideation? Denies  Description of Suicide Plan n/a  Agreement Not to Harm Self Yes  Description of Agreement verbal  Danger to Others  Danger to Others None reported or observed    Estimated Sleeping Duration (Last 24 Hours): 7.00-9.25 hours

## 2024-11-03 NOTE — Progress Notes (Signed)
 D- Patient alert and oriented x 3. Pt disoriented to situation. Pt states she does not know why she is in the hospital. Pt presents with a pleasant mood and affect. Denies SI, HI, AVH, and pain. Pt states her goal is to be discharged. Writer encouragement pt for other goals as well. No other goals noted by pt.   A- Scheduled medications administered to patient, per MD orders. Support and encouragement provided.  Routine safety checks conducted every 15 minutes.  Patient informed to notify staff with problems or concerns.  R- No adverse drug reactions noted. Patient contracts for safety at this time. Patient compliant with medications and treatment plan. Patient receptive, calm, and cooperative. Patient interacts well with others on the unit.  Patient remains safe at this time.    Eliyahu Bille S.,RN

## 2024-11-03 NOTE — Plan of Care (Signed)
  Problem: Health Behavior/Discharge Planning: Goal: Compliance with treatment plan for underlying cause of condition will improve Outcome: Progressing   Problem: Physical Regulation: Goal: Ability to maintain clinical measurements within normal limits will improve Outcome: Progressing   Problem: Safety: Goal: Periods of time without injury will increase Outcome: Progressing   

## 2024-11-03 NOTE — Progress Notes (Signed)
 [facility] MD Progress Note  11/03/2024 6:44 PM Shari [name]  MRN:  [id number] Patient is a 68 year old female with a history of Schizoaffective Disorder, Bipolar Type who presents via [employer] under IVC, initiated by CM, Falencio with re-entry program, to [facility] for assessment. Per IVC, [name] has been diagnosed with Schizophrenia and is refusing to take medication. [name] isn't sleeping. Residents report her screaming and yelling all night long.  [name] responding to internal stimuli.  [name] verbally aggressive.  The respondent has locked all of the residents inside of the transitional housing home.  They could not leave the home which prompted the IVC.  Residents did not have access to food, the restroom or anything as a result of being locked in.  [name] is currently under Adult protective services care. Patient is admitted to [facility] unit with Q15 min safety monitoring. Multidisciplinary team approach is offered. Medication management; group/milieu therapy is offered.   Subjective:  Chart reviewed, case discussed in multidisciplinary meeting, patient seen during rounds.   On interview patient is noted to be resting in bed after lunch.  She offers no complaints.  She reports fair appetite and sleep.  Patient offers no complaints and remains discharge focused.  She denies SI/HI/plan and denies hallucinations.  Patient is taking medications with no reported side effects.    Past Psychiatric History: see h&P Family History:  Family History  Problem Relation Age of Onset   Diabetes Mother    CAD Mother    Lung cancer Father    Social History:  Social History   Substance and Sexual Activity  Alcohol Use Yes   Comment: beer occasionally     Social History   Substance and Sexual Activity  Drug Use No    Social History   Socioeconomic History   Marital status: Widowed    Spouse name: Not on file   Number of children: Not on file   Years of education: Not  on file   Highest education level: Not on file  Occupational History   Not on file  Tobacco Use   Smoking status: Every Day    Current packs/day: 0.50    Average packs/day: 0.5 packs/day for 40.0 years (20.0 ttl pk-yrs)    Types: Cigarettes   Smokeless tobacco: Never  Vaping Use   Vaping status: Never Used  Substance and Sexual Activity   Alcohol use: Yes    Comment: beer occasionally   Drug use: No   Sexual activity: Not Currently    Birth control/protection: Post-menopausal  Other Topics Concern   Not on file  Social History Narrative   Not on file   Social Drivers of Health   Financial Resource Strain: Not on file  Food Insecurity: No Food Insecurity (10/17/2024)   Hunger Vital Sign    Worried About Running Out of Food in the Last Year: Never true    Ran Out of Food in the Last Year: Never true  Transportation Needs: No Transportation Needs (10/17/2024)   PRAPARE - Administrator, Civil Service (Medical): No    Lack of Transportation (Non-Medical): No  Recent Concern: Transportation Needs - Unmet Transportation Needs (10/14/2024)   PRAPARE - Administrator, Civil Service (Medical): Yes    Lack of Transportation (Non-Medical): Yes  Physical Activity: Not on file  Stress: Not on file  Social Connections: Unknown (10/17/2024)   Social Connection and Isolation Panel    Frequency of Communication  with Friends and Family: Patient unable to answer    Frequency of Social Gatherings with Friends and Family: Patient unable to answer    Attends Religious Services: Patient unable to answer    Active Member of Clubs or Organizations: Patient declined    Attends Banker Meetings: Patient unable to answer    Marital Status: Widowed   Past Medical History:  Past Medical History:  Diagnosis Date   Eye globe prosthesis    GERD (gastroesophageal reflux disease)    History of blood transfusion 1973   related to abscess burst in my stomach    Hyperlipemia    Hyperlipidemia    Hypertension    Osteoarthritis of back    Lowerback    SVD (spontaneous vaginal delivery)    x 1, baby died at 2 wks of age   Type II diabetes mellitus (HCC)     Past Surgical History:  Procedure Laterality Date   APPENDECTOMY     COLONOSCOPY     DILATATION & CURETTAGE/HYSTEROSCOPY WITH MYOSURE N/A 02/25/2015   Procedure: DILATATION & CURETTAGE/HYSTEROSCOPY WITH MYOSURE;  Surgeon: Truman Corona, MD;  Location: [facility];  Service: Gynecology;  Laterality: N/A;   DILATION AND CURETTAGE OF UTERUS     ENUCLEATION Right 03/16/2017   ENUCLEATION Right 03/16/2017   Procedure: ENUCLEATION RIGHT EYE;  Surgeon: Loyd Kathryne Palm, MD;  Location: [facility] OR;  Service: Ophthalmology;  Laterality: Right;   EYE SURGERY     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6, 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   EYE SURGERY Right ~ 1974   S/P initial eye injury; scissors stuck in my eye   LAPAROSCOPIC CHOLECYSTECTOMY     SHOULDER ARTHROSCOPY WITH ROTATOR CUFF REPAIR Left    wire stitches per patient   SHOULDER ARTHROSCOPY WITH ROTATOR CUFF REPAIR Right     Current Medications: Current Facility-Administered Medications  Medication Dose Route Frequency Provider Last Rate Last Admin   acetaminophen  (TYLENOL ) tablet 650 mg  650 mg Oral Q6H PRN Tex Drilling, NP   650 mg at 11/03/24 0949   alum & mag hydroxide-simeth (MAALOX/MYLANTA) 200-200-20 MG/5ML suspension 30 mL  30 mL Oral Q4H PRN Tex Drilling, NP       cloNIDine  (CATAPRES ) tablet 0.1 mg  0.1 mg Oral BID PRN Tex Drilling, NP       divalproex  (DEPAKOTE ) DR tablet 750 mg  750 mg Oral Q12H Dawnisha Marquina, MD   750 mg at 11/03/24 9050   losartan  (COZAAR ) tablet 25 mg  25 mg Oral Daily Nkwenti, Doris, NP   25 mg at 11/03/24 0949   magnesium  hydroxide (MILK OF MAGNESIA) suspension 30 mL  30 mL Oral Daily PRN Tex Drilling, NP       methimazole  (TAPAZOLE ) tablet 10 mg  10 mg Oral Daily Nkwenti, Doris, NP   10 mg at 11/03/24 9045   OLANZapine  (ZYPREXA )  injection 5 mg  5 mg Intramuscular TID PRN Tex Drilling, NP       OLANZapine  (ZYPREXA ) injection 5 mg  5 mg Intramuscular TID PRN Tex Drilling, NP       OLANZapine  (ZYPREXA ) tablet 20 mg  20 mg Oral QHS Kazzandra Desaulniers, MD   20 mg at 11/02/24 2113   OLANZapine  zydis (ZYPREXA ) disintegrating tablet 5 mg  5 mg Oral TID PRN Tex Drilling, NP       OLANZapine  zydis (ZYPREXA ) disintegrating tablet 5 mg  5 mg Oral TID PRN Tex Drilling, NP       pantoprazole  (PROTONIX )  EC tablet 40 mg  40 mg Oral Daily Nkwenti, Doris, NP   40 mg at 11/03/24 9050   traZODone  (DESYREL ) tablet 50 mg  50 mg Oral QHS PRN Tex Drilling, NP   50 mg at 11/01/24 2111    Lab Results:  No results found for this or any previous visit (from the past 48 hours).     Blood Alcohol level:  Lab Results  Component Value Date   [facility] <15 10/14/2024   ETH <10 07/02/2022    Metabolic Disorder Labs: Lab Results  Component Value Date   HGBA1C 5.5 10/14/2024   MPG 111.15 10/14/2024   MPG 137 01/06/2022   Lab Results  Component Value Date   PROLACTIN 5.8 10/14/2024   Lab Results  Component Value Date   CHOL 167 10/14/2024   TRIG 68 10/14/2024   HDL 60 10/14/2024   CHOLHDL 2.8 10/14/2024   VLDL 14 10/14/2024   LDLCALC 93 10/14/2024   LDLCALC 73 03/13/2024    Physical Findings: AIMS:  , ,  ,  ,    CIWA:    COWS:      Psychiatric Specialty Exam:  Presentation  General Appearance:  Appropriate for Environment  Eye Contact: Fleeting  Speech: Normal Rate  Speech Volume: Decreased    Mood and Affect  Mood:fine  Affect: Flat   Thought Process  Thought Processes: impoverished  Orientation:Partial  Thought Content:Illogical; Paranoid Ideation  Hallucinations: Denies  Ideas of Reference:Delusions; Paranoia  Suicidal Thoughts: Denies  Homicidal Thoughts: Denies   Sensorium  Memory: Immediate Fair; Recent Fair  Judgment: Impaired  Insight: Shallow   Executive Functions   Concentration: Fair  Attention Span: Fair  Recall: Poor  Fund of Knowledge: Fair  Language: Fair   Psychomotor Activity  Psychomotor Activity: No data recorded  Musculoskeletal: Strength & Muscle Tone: within normal limits Gait & Station: normal Assets  Assets: Manufacturing Systems Engineer; Desire for Improvement; Social Support    Physical Exam: Physical Exam Vitals and nursing note reviewed.    ROS Blood pressure 131/79, pulse 76, temperature 97.9 F (36.6 C), resp. rate 17, height 5' 8 (1.727 m), weight 48.3 kg, SpO2 100%. Body mass index is 16.19 kg/m.  Diagnosis: Principal Problem:   Schizophrenia, paranoid (HCC)    Treatment Plan Summary:  Safety and Monitoring:             -- Involuntary admission to inpatient psychiatric unit for safety, stabilization and treatment             -- Daily contact with patient to assess and evaluate symptoms and progress in treatment             -- Patient's case to be discussed in multi-disciplinary team meeting             -- Observation Level: q15 minute checks             -- Vital signs:  q12 hours             -- Precautions: suicide, elopement, and assault   2. Psychiatric Diagnoses and Treatment:            Depakote   750 mg twice daily Zyprexa  to 15 mg nightly     -- The risks/benefits/side-effects/alternatives to this medication were discussed in detail with the patient and time was given for questions. The patient consents to medication trial.                -- Metabolic profile and EKG  monitoring obtained while on an atypical antipsychotic (BMI: Lipid Panel: HbgA1c: QTc:)              -- Encouraged patient to participate in unit milieu and in scheduled group therapies  4. Discharge Planning:   -- Social work and case management to assist with discharge planning and identification of hospital follow-up needs prior to discharge  -- Estimated LOS: 3-4 days  Allyn Foil, MD 11/03/2024, 6:44 PM

## 2024-11-03 NOTE — Plan of Care (Signed)
   Problem: Activity: Goal: Interest or engagement in activities will improve Outcome: Progressing Goal: Sleeping patterns will improve Outcome: Progressing

## 2024-11-03 NOTE — Group Note (Signed)
 LCSW Group Therapy Note  Group Date: 11/02/2024 Start Time: 1330 End Time: 1400   Type of Therapy and Topic:  Group Therapy - How To Cope with Nervousness about Discharge   Participation Level:  Did Not Attend   Description of Group This process group involved identification of patients' feelings about discharge. Some of them are scheduled to be discharged soon, while others are new admissions, but each of them was asked to share thoughts and feelings surrounding discharge from the hospital. One common theme was that they are excited at the prospect of going home, while another was that many of them are apprehensive about sharing why they were hospitalized. Patients were given the opportunity to discuss these feelings with their peers in preparation for discharge.  Therapeutic Goals  Patient will identify their overall feelings about pending discharge. Patient will think about how they might proactively address issues that they believe will once again arise once they get home (i.e. with parents). Patients will participate in discussion about having hope for change.   Summary of Patient Progress:  X   Therapeutic Modalities Cognitive Behavioral Therapy   Shari [name] D Ajanee Buren, LCSWA 11/03/2024  6:38 AM

## 2024-11-04 DIAGNOSIS — F200 Paranoid schizophrenia: Secondary | ICD-10-CM | POA: Diagnosis not present

## 2024-11-04 NOTE — Plan of Care (Signed)
   Problem: Activity: Goal: Interest or engagement in activities will improve Outcome: Progressing   Problem: Health Behavior/Discharge Planning: Goal: Compliance with treatment plan for underlying cause of condition will improve Outcome: Progressing   Problem: Safety: Goal: Periods of time without injury will increase Outcome: Progressing

## 2024-11-04 NOTE — Group Note (Signed)
 Recreation Therapy Group Note   Group Topic:Other  Group Date: 11/04/2024 Start Time: 1400 End Time: 1445 Facilitators: Celestia Jeoffrey BRAVO, LRT, CTRS Location: Dayroom  Activity Description/Intervention: Therapeutic Drumming. Patients with peers and staff were given the opportunity to engage in a leader facilitated [facility] Group Empowerment Drumming Circle with staff from the [employer], in partnership with [employer]. Teaching laboratory technician and trained [place], Norleen Mon leading with LRT observing and documenting intervention and pt response. This evidenced-based practice targets 7 areas of health and wellbeing in the human experience including: stress-reduction, exercise, self-expression, camaraderie/support, nurturing, spirituality, and music-making (leisure).    Goal Area(s) Addresses:  Patient will engage in pro-social way in music group.  Patient will follow directions of drum leader on the first prompt. Patient will demonstrate no behavioral issues during group.  Patient will identify if a reduction in stress level occurs as a result of participation in therapeutic drum circle.   Affect/Mood: N/A   Participation Level: Did not attend    Clinical Observations/Individualized Feedback: Patient did not attend group.   Plan: Continue to engage patient in RT group sessions 2-3x/week.   Jeoffrey BRAVO Celestia, LRT, CTRS 11/04/2024 4:55 PM

## 2024-11-04 NOTE — Progress Notes (Signed)
 [facility] MD Progress Note  11/04/2024 8:49 PM Shari [name]  MRN:  [id number] Patient is a 68 year old female with a history of Schizoaffective Disorder, Bipolar Type who presents via [facility] under IVC, initiated by CM, Falencio with re-entry program, to [facility] for assessment. Per IVC, [name] has been diagnosed with Schizophrenia and is refusing to take medication. [name] isn't sleeping. Residents report her screaming and yelling all night long.  [name] responding to internal stimuli.  [name] verbally aggressive.  The respondent has locked all of the residents inside of the transitional housing home.  They could not leave the home which prompted the IVC.  Residents did not have access to food, the restroom or anything as a result of being locked in.  [name] is currently under Adult protective services care. Patient is admitted to [facility] unit with Q15 min safety monitoring. Multidisciplinary team approach is offered. Medication management; group/milieu therapy is offered.   Subjective:  Chart reviewed, case discussed in multidisciplinary meeting, patient seen during rounds.   Patient is noted to be resting in her room.  She offers no complaints.  She remains discharge focused and wants to know when she can go to her house and spite of being told that she lives at the transitional house.  Per social work team APS has filed for guardianship and will be reaching out to the team soon about placement.  She denies SI/HI/plan and denies auditory/visual hallucinations.  Past Psychiatric History: see h&P Family History:  Family History  Problem Relation Age of Onset   Diabetes Mother    CAD Mother    Lung cancer Father    Social History:  Social History   Substance and Sexual Activity  Alcohol Use Yes   Comment: beer occasionally     Social History   Substance and Sexual Activity  Drug Use No    Social History   Socioeconomic History   Marital status: Widowed     Spouse name: Not on file   Number of children: Not on file   Years of education: Not on file   Highest education level: Not on file  Occupational History   Not on file  Tobacco Use   Smoking status: Every Day    Current packs/day: 0.50    Average packs/day: 0.5 packs/day for 40.0 years (20.0 ttl pk-yrs)    Types: Cigarettes   Smokeless tobacco: Never  Vaping Use   Vaping status: Never Used  Substance and Sexual Activity   Alcohol use: Yes    Comment: beer occasionally   Drug use: No   Sexual activity: Not Currently    Birth control/protection: Post-menopausal  Other Topics Concern   Not on file  Social History Narrative   Not on file   Social Drivers of Health   Financial Resource Strain: Not on file  Food Insecurity: No Food Insecurity (10/17/2024)   Hunger Vital Sign    Worried About Running Out of Food in the Last Year: Never true    Ran Out of Food in the Last Year: Never true  Transportation Needs: No Transportation Needs (10/17/2024)   PRAPARE - Administrator, Civil Service (Medical): No    Lack of Transportation (Non-Medical): No  Recent Concern: Transportation Needs - Unmet Transportation Needs (10/14/2024)   PRAPARE - Administrator, Civil Service (Medical): Yes    Lack of Transportation (Non-Medical): Yes  Physical Activity: Not on file  Stress: Not on  file  Social Connections: Unknown (10/17/2024)   Social Connection and Isolation Panel    Frequency of Communication with Friends and Family: Patient unable to answer    Frequency of Social Gatherings with Friends and Family: Patient unable to answer    Attends Religious Services: Patient unable to answer    Active Member of Clubs or Organizations: Patient declined    Attends Banker Meetings: Patient unable to answer    Marital Status: Widowed   Past Medical History:  Past Medical History:  Diagnosis Date   Eye globe prosthesis    GERD (gastroesophageal reflux  disease)    History of blood transfusion 1973   related to abscess burst in my stomach   Hyperlipemia    Hyperlipidemia    Hypertension    Osteoarthritis of back    Lowerback    SVD (spontaneous vaginal delivery)    x 1, baby died at 2 wks of age   Type II diabetes mellitus (HCC)     Past Surgical History:  Procedure Laterality Date   APPENDECTOMY     COLONOSCOPY     DILATATION & CURETTAGE/HYSTEROSCOPY WITH MYOSURE N/A 02/25/2015   Procedure: DILATATION & CURETTAGE/HYSTEROSCOPY WITH MYOSURE;  Surgeon: Truman Corona, MD;  Location: [facility];  Service: Gynecology;  Laterality: N/A;   DILATION AND CURETTAGE OF UTERUS     ENUCLEATION Right 03/16/2017   ENUCLEATION Right 03/16/2017   Procedure: ENUCLEATION RIGHT EYE;  Surgeon: Loyd Kathryne Palm, MD;  Location: [facility] OR;  Service: Ophthalmology;  Laterality: Right;   EYE SURGERY     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6, 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   EYE SURGERY Right ~ 1974   S/P initial eye injury; scissors stuck in my eye   LAPAROSCOPIC CHOLECYSTECTOMY     SHOULDER ARTHROSCOPY WITH ROTATOR CUFF REPAIR Left    wire stitches per patient   SHOULDER ARTHROSCOPY WITH ROTATOR CUFF REPAIR Right     Current Medications: Current Facility-Administered Medications  Medication Dose Route Frequency Provider Last Rate Last Admin   acetaminophen  (TYLENOL ) tablet 650 mg  650 mg Oral Q6H PRN Tex Drilling, NP   650 mg at 11/04/24 1023   alum & mag hydroxide-simeth (MAALOX/MYLANTA) 200-200-20 MG/5ML suspension 30 mL  30 mL Oral Q4H PRN Nkwenti, Drilling, NP       cloNIDine  (CATAPRES ) tablet 0.1 mg  0.1 mg Oral BID PRN Tex Drilling, NP       divalproex  (DEPAKOTE ) DR tablet 750 mg  750 mg Oral Q12H Inez Stantz, MD   750 mg at 11/04/24 1017   losartan  (COZAAR ) tablet 25 mg  25 mg Oral Daily Nkwenti, Doris, NP   25 mg at 11/04/24 1017   magnesium  hydroxide (MILK OF MAGNESIA) suspension 30 mL  30 mL Oral Daily PRN Tex Drilling, NP       methimazole  (TAPAZOLE ) tablet 10  mg  10 mg Oral Daily Nkwenti, Doris, NP   10 mg at 11/04/24 1017   OLANZapine  (ZYPREXA ) injection 5 mg  5 mg Intramuscular TID PRN Tex Drilling, NP       OLANZapine  (ZYPREXA ) injection 5 mg  5 mg Intramuscular TID PRN Tex Drilling, NP       OLANZapine  (ZYPREXA ) tablet 20 mg  20 mg Oral QHS Daking Westervelt, MD   20 mg at 11/03/24 2146   OLANZapine  zydis (ZYPREXA ) disintegrating tablet 5 mg  5 mg Oral TID PRN Tex Drilling, NP       OLANZapine  zydis (ZYPREXA ) disintegrating tablet  5 mg  5 mg Oral TID PRN Tex Drilling, NP       pantoprazole  (PROTONIX ) EC tablet 40 mg  40 mg Oral Daily Nkwenti, Doris, NP   40 mg at 11/04/24 1017   traZODone  (DESYREL ) tablet 50 mg  50 mg Oral QHS PRN Tex Drilling, NP   50 mg at 11/03/24 2146    Lab Results:  No results found for this or any previous visit (from the past 48 hours).     Blood Alcohol level:  Lab Results  Component Value Date   [facility] <15 10/14/2024   ETH <10 07/02/2022    Metabolic Disorder Labs: Lab Results  Component Value Date   HGBA1C 5.5 10/14/2024   MPG 111.15 10/14/2024   MPG 137 01/06/2022   Lab Results  Component Value Date   PROLACTIN 5.8 10/14/2024   Lab Results  Component Value Date   CHOL 167 10/14/2024   TRIG 68 10/14/2024   HDL 60 10/14/2024   CHOLHDL 2.8 10/14/2024   VLDL 14 10/14/2024   LDLCALC 93 10/14/2024   LDLCALC 73 03/13/2024    Physical Findings: AIMS:  , ,  ,  ,    CIWA:    COWS:      Psychiatric Specialty Exam:  Presentation  General Appearance:  Appropriate for Environment  Eye Contact: Fleeting  Speech: Normal Rate  Speech Volume: Decreased    Mood and Affect  Mood:fine  Affect: Flat   Thought Process  Thought Processes: impoverished  Orientation:Partial  Thought Content:Illogical; Paranoid Ideation  Hallucinations: Denies  Ideas of Reference:Delusions; Paranoia  Suicidal Thoughts: Denies  Homicidal Thoughts: Denies   Sensorium  Memory: Immediate  Fair; Recent Fair  Judgment: Impaired  Insight: Shallow   Executive Functions  Concentration: Fair  Attention Span: Fair  Recall: Poor  Fund of Knowledge: Fair  Language: Fair   Psychomotor Activity  Psychomotor Activity: No data recorded  Musculoskeletal: Strength & Muscle Tone: within normal limits Gait & Station: normal Assets  Assets: Manufacturing Systems Engineer; Desire for Improvement; Social Support    Physical Exam: Physical Exam Vitals and nursing note reviewed.    ROS Blood pressure 124/72, pulse 84, temperature 99.3 F (37.4 C), resp. rate 14, height 5' 8 (1.727 m), weight 48.3 kg, SpO2 99%. Body mass index is 16.19 kg/m.  Diagnosis: Principal Problem:   Schizophrenia, paranoid (HCC)    Treatment Plan Summary:  Safety and Monitoring:             -- Involuntary admission to inpatient psychiatric unit for safety, stabilization and treatment             -- Daily contact with patient to assess and evaluate symptoms and progress in treatment             -- Patient's case to be discussed in multi-disciplinary team meeting             -- Observation Level: q15 minute checks             -- Vital signs:  q12 hours             -- Precautions: suicide, elopement, and assault   2. Psychiatric Diagnoses and Treatment:            Depakote   750 mg twice daily Zyprexa  to 15 mg nightly     -- The risks/benefits/side-effects/alternatives to this medication were discussed in detail with the patient and time was given for questions. The patient consents to medication trial.                --  Metabolic profile and EKG monitoring obtained while on an atypical antipsychotic (BMI: Lipid Panel: HbgA1c: QTc:)              -- Encouraged patient to participate in unit milieu and in scheduled group therapies  4. Discharge Planning:   -- Social work and case management to assist with discharge planning and identification of hospital follow-up needs prior to  discharge  -- Estimated LOS: 3-4 days  Allyn Foil, MD 11/04/2024, 8:49 PM

## 2024-11-04 NOTE — Group Note (Signed)
 Date:  11/04/2024 Time:  9:54 PM  Group Topic/Focus:  Wrap-Up Group:   The focus of this group is to help patients review their daily goal of treatment and discuss progress on daily workbooks.    Participation Level:  Active  Participation Quality:  Appropriate  Affect:  Appropriate  Cognitive:  Appropriate  Insight: Appropriate  Engagement in Group:  Engaged  Modes of Intervention:  Discussion  Additional Comments:    Shari [name] [name] 11/04/2024, 9:54 PM

## 2024-11-04 NOTE — Group Note (Signed)
 Date:  11/04/2024 Time:  6:11 PM  Group Topic/Focus:   Christmas coloring  Coloring Relieves Stress Coloring is a stimulating activity that provides many health benefits similar to meditation. It relaxes the mind and focuses attention on the present moment, which may help get rid of negative thoughts and ease the stress of daily life Coloring improves fine motor skills and hand-eye coordination, which are crucial for maintaining dexterity in older adults. Engaging in creative activities like coloring stimulates cognitive function, aiding in memory retention and problem-solving skills.  Participation Level:  Did Not Attend   Harlene LITTIE Gavel 11/04/2024, 6:11 PM

## 2024-11-04 NOTE — Group Note (Signed)
 Date:  11/04/2024 Time:  4:30 PM  Group Topic/Focus:  Coping With Mental Health Crisis:   The purpose of this group is to help patients identify strategies for coping with mental health crisis.  Group discusses possible causes of crisis and ways to manage them effectively.    Participation Level:  Active  Participation Quality:  Appropriate  Affect:  Appropriate  Cognitive:  Appropriate  Insight: Appropriate  Engagement in Group:  Engaged  Modes of Intervention:  Activity  Additional Comments:    [name] 11/04/2024, 4:30 PM

## 2024-11-04 NOTE — Plan of Care (Signed)
   Problem: Education: Goal: Verbalization of understanding the information provided will improve Outcome: Progressing   Problem: Activity: Goal: Interest or engagement in activities will improve Outcome: Progressing Goal: Sleeping patterns will improve Outcome: Progressing

## 2024-11-04 NOTE — Progress Notes (Signed)
 Behavior: Pleasant and cooperative.     Psych assessment: Denies SI/HI and AVH.  Interaction / Group attendance:  Present in the milieu.  Attends groups.  Appropriate interaction with peers and staff.    Medication/ PRNs: Compliant.   Pain: Denies.    15 min checks in place for safety.

## 2024-11-04 NOTE — Progress Notes (Signed)
   11/03/24 2250  Psych Admission Type (Psych Patients Only)  Admission Status Involuntary  Psychosocial Assessment  Patient Complaints None  Eye Contact Fair  Facial Expression Flat  Affect Appropriate to circumstance  Speech Logical/coherent  Interaction Minimal  Motor Activity Other (Comment)  Appearance/Hygiene Unremarkable  Behavior Characteristics Cooperative;Appropriate to situation  Mood Pleasant  Thought Process  Coherency WDL  Content WDL  Delusions None reported or observed  Perception WDL  Hallucination None reported or observed  Judgment Impaired  Confusion Mild  Danger to Self  Current suicidal ideation? Denies  Description of Suicide Plan n/a  Agreement Not to Harm Self Yes  Description of Agreement verbal  Danger to Others  Danger to Others None reported or observed    Estimated Sleeping Duration (Last 24 Hours): 7.50-9.25 hours

## 2024-11-05 MED ORDER — NICOTINE POLACRILEX 2 MG MT GUM: 2.0000 mg | CHEWING_GUM | OROMUCOSAL | Status: DC | PRN

## 2024-11-05 MED ORDER — NICOTINE 7 MG/24HR TD PT24
7.0000 mg | MEDICATED_PATCH | Freq: Every day | TRANSDERMAL | Status: DC
  Filled 2024-11-05: qty 1

## 2024-11-05 MED ORDER — LIDOCAINE 5 % EX PTCH: 1.0000 | MEDICATED_PATCH | CUTANEOUS | Status: DC

## 2024-11-05 NOTE — Group Note (Signed)
 Date:  11/05/2024 Time:  9:23 PM  Group Topic/Focus:  Wrap-Up Group:   The focus of this group is to help patients review their daily goal of treatment and discuss progress on daily workbooks.    Participation Level:  Active  Participation Quality:  Appropriate  Affect:  Appropriate  Cognitive:  Alert  Insight: Appropriate  Engagement in Group:  Engaged  Modes of Intervention:  Discussion  Additional Comments:    Shari [name] Shari [name] 11/05/2024, 9:23 PM

## 2024-11-05 NOTE — Progress Notes (Signed)
 [facility] MD Progress Note  11/05/2024 1:02 PM Shari [name]  MRN:  [id number] Patient is a 68 year old female with a history of Schizoaffective Disorder, Bipolar Type who presents via [employer] under IVC, initiated by CM, Falencio with re-entry program, to [facility] for assessment. Per IVC, [name] has been diagnosed with Schizophrenia and is refusing to take medication. [name] isn't sleeping. Residents report her screaming and yelling all night long.  [name] responding to internal stimuli.  [name] verbally aggressive.  The respondent has locked all of the residents inside of the transitional housing home.  They could not leave the home which prompted the IVC.  Residents did not have access to food, the restroom or anything as a result of being locked in.  [name] is currently under Adult protective services care. Patient is admitted to [facility] unit with Q15 min safety monitoring. Multidisciplinary team approach is offered. Medication management; group/milieu therapy is offered.   Subjective:  Chart reviewed, case discussed in multidisciplinary meeting, patient seen during rounds.   Patient is noted to be sitting in the day area.  She offers no complaints.  Per nursing patient is participating in groups and is displaying no behavioral problems.  Patient continues to display intermittent bizarre behaviors of talking to herself.  Patient remains delusional about transitional house being her house and is not working with social work team regarding transition of care as she remains adamant about going back to the transitional house and getting rid of the people living there.  APS is working on legal guardianship this week.  Past Psychiatric History: see h&P Family History:  Family History  Problem Relation Age of Onset   Diabetes Mother    CAD Mother    Lung cancer Father    Social History:  Social History   Substance and Sexual Activity  Alcohol Use Yes   Comment: beer  occasionally     Social History   Substance and Sexual Activity  Drug Use No    Social History   Socioeconomic History   Marital status: Widowed    Spouse name: Not on file   Number of children: Not on file   Years of education: Not on file   Highest education level: Not on file  Occupational History   Not on file  Tobacco Use   Smoking status: Every Day    Current packs/day: 0.50    Average packs/day: 0.5 packs/day for 40.0 years (20.0 ttl pk-yrs)    Types: Cigarettes   Smokeless tobacco: Never  Vaping Use   Vaping status: Never Used  Substance and Sexual Activity   Alcohol use: Yes    Comment: beer occasionally   Drug use: No   Sexual activity: Not Currently    Birth control/protection: Post-menopausal  Other Topics Concern   Not on file  Social History Narrative   Not on file   Social Drivers of Health   Financial Resource Strain: Not on file  Food Insecurity: No Food Insecurity (10/17/2024)   Hunger Vital Sign    Worried About Running Out of Food in the Last Year: Never true    Ran Out of Food in the Last Year: Never true  Transportation Needs: No Transportation Needs (10/17/2024)   PRAPARE - Administrator, Civil Service (Medical): No    Lack of Transportation (Non-Medical): No  Recent Concern: Transportation Needs - Unmet Transportation Needs (10/14/2024)   PRAPARE - Transportation    Lack of Transportation (  Medical): Yes    Lack of Transportation (Non-Medical): Yes  Physical Activity: Not on file  Stress: Not on file  Social Connections: Unknown (10/17/2024)   Social Connection and Isolation Panel    Frequency of Communication with Friends and Family: Patient unable to answer    Frequency of Social Gatherings with Friends and Family: Patient unable to answer    Attends Religious Services: Patient unable to answer    Active Member of Clubs or Organizations: Patient declined    Attends Banker Meetings: Patient unable to answer     Marital Status: Widowed   Past Medical History:  Past Medical History:  Diagnosis Date   Eye globe prosthesis    GERD (gastroesophageal reflux disease)    History of blood transfusion 1973   related to abscess burst in my stomach   Hyperlipemia    Hyperlipidemia    Hypertension    Osteoarthritis of back    Lowerback    SVD (spontaneous vaginal delivery)    x 1, baby died at 2 wks of age   Type II diabetes mellitus (HCC)     Past Surgical History:  Procedure Laterality Date   APPENDECTOMY     COLONOSCOPY     DILATATION & CURETTAGE/HYSTEROSCOPY WITH MYOSURE N/A 02/25/2015   Procedure: DILATATION & CURETTAGE/HYSTEROSCOPY WITH MYOSURE;  Surgeon: Truman Corona, MD;  Location: [facility];  Service: Gynecology;  Laterality: N/A;   DILATION AND CURETTAGE OF UTERUS     ENUCLEATION Right 03/16/2017   ENUCLEATION Right 03/16/2017   Procedure: ENUCLEATION RIGHT EYE;  Surgeon: Loyd Kathryne Palm, MD;  Location: [facility] OR;  Service: Ophthalmology;  Laterality: Right;   EYE SURGERY     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6, 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   EYE SURGERY Right ~ 1974   S/P initial eye injury; scissors stuck in my eye   LAPAROSCOPIC CHOLECYSTECTOMY     SHOULDER ARTHROSCOPY WITH ROTATOR CUFF REPAIR Left    wire stitches per patient   SHOULDER ARTHROSCOPY WITH ROTATOR CUFF REPAIR Right     Current Medications: Current Facility-Administered Medications  Medication Dose Route Frequency Provider Last Rate Last Admin   acetaminophen  (TYLENOL ) tablet 650 mg  650 mg Oral Q6H PRN Tex Drilling, NP   650 mg at 11/05/24 0913   alum & mag hydroxide-simeth (MAALOX/MYLANTA) 200-200-20 MG/5ML suspension 30 mL  30 mL Oral Q4H PRN Tex Drilling, NP       cloNIDine  (CATAPRES ) tablet 0.1 mg  0.1 mg Oral BID PRN Tex Drilling, NP       divalproex  (DEPAKOTE ) DR tablet 750 mg  750 mg Oral Q12H Annah Jasko, MD   750 mg at 11/05/24 9096   losartan  (COZAAR ) tablet 25 mg  25 mg Oral Daily Nkwenti, Doris, NP   25 mg at  11/05/24 0903   magnesium  hydroxide (MILK OF MAGNESIA) suspension 30 mL  30 mL Oral Daily PRN Tex Drilling, NP       methimazole  (TAPAZOLE ) tablet 10 mg  10 mg Oral Daily Nkwenti, Doris, NP   10 mg at 11/05/24 9097   OLANZapine  (ZYPREXA ) injection 5 mg  5 mg Intramuscular TID PRN Tex Drilling, NP       OLANZapine  (ZYPREXA ) injection 5 mg  5 mg Intramuscular TID PRN Tex Drilling, NP       OLANZapine  (ZYPREXA ) tablet 20 mg  20 mg Oral QHS Garrett Mitchum, MD   20 mg at 11/04/24 2132   OLANZapine  zydis (ZYPREXA ) disintegrating tablet 5 mg  5 mg Oral TID PRN Tex Drilling, NP       OLANZapine  zydis (ZYPREXA ) disintegrating tablet 5 mg  5 mg Oral TID PRN Tex Drilling, NP       pantoprazole  (PROTONIX ) EC tablet 40 mg  40 mg Oral Daily Nkwenti, Doris, NP   40 mg at 11/05/24 9097   traZODone  (DESYREL ) tablet 50 mg  50 mg Oral QHS PRN Tex Drilling, NP   50 mg at 11/04/24 2131    Lab Results:  No results found for this or any previous visit (from the past 48 hours).     Blood Alcohol level:  Lab Results  Component Value Date   [facility] <15 10/14/2024   ETH <10 07/02/2022    Metabolic Disorder Labs: Lab Results  Component Value Date   HGBA1C 5.5 10/14/2024   MPG 111.15 10/14/2024   MPG 137 01/06/2022   Lab Results  Component Value Date   PROLACTIN 5.8 10/14/2024   Lab Results  Component Value Date   CHOL 167 10/14/2024   TRIG 68 10/14/2024   HDL 60 10/14/2024   CHOLHDL 2.8 10/14/2024   VLDL 14 10/14/2024   LDLCALC 93 10/14/2024   LDLCALC 73 03/13/2024    Physical Findings: AIMS:  , ,  ,  ,    CIWA:    COWS:      Psychiatric Specialty Exam:  Presentation  General Appearance:  Appropriate for Environment  Eye Contact: Fleeting  Speech: Normal Rate  Speech Volume: Decreased    Mood and Affect  Mood:fine  Affect: Flat   Thought Process  Thought Processes: impoverished  Orientation:Partial  Thought Content:Illogical; Paranoid  Ideation  Hallucinations: Denies  Ideas of Reference:Delusions; Paranoia  Suicidal Thoughts: Denies  Homicidal Thoughts: Denies   Sensorium  Memory: Immediate Fair; Recent Fair  Judgment: Impaired  Insight: Shallow   Executive Functions  Concentration: Fair  Attention Span: Fair  Recall: Poor  Fund of Knowledge: Fair  Language: Fair   Psychomotor Activity  Psychomotor Activity: No data recorded  Musculoskeletal: Strength & Muscle Tone: within normal limits Gait & Station: normal Assets  Assets: Manufacturing Systems Engineer; Desire for Improvement; Social Support    Physical Exam: Physical Exam Vitals and nursing note reviewed.    ROS Blood pressure 118/71, pulse 91, temperature 98.2 F (36.8 C), resp. rate 16, height 5' 8 (1.727 m), weight 48.3 kg, SpO2 100%. Body mass index is 16.19 kg/m.  Diagnosis: Principal Problem:   Schizophrenia, paranoid (HCC)    Treatment Plan Summary:  Safety and Monitoring:             -- Involuntary admission to inpatient psychiatric unit for safety, stabilization and treatment             -- Daily contact with patient to assess and evaluate symptoms and progress in treatment             -- Patient's case to be discussed in multi-disciplinary team meeting             -- Observation Level: q15 minute checks             -- Vital signs:  q12 hours             -- Precautions: suicide, elopement, and assault   2. Psychiatric Diagnoses and Treatment:            Depakote   750 mg twice daily Zyprexa  to 15 mg nightly     -- The risks/benefits/side-effects/alternatives to this medication were  discussed in detail with the patient and time was given for questions. The patient consents to medication trial.                -- Metabolic profile and EKG monitoring obtained while on an atypical antipsychotic (BMI: Lipid Panel: HbgA1c: QTc:)              -- Encouraged patient to participate in unit milieu and in scheduled group  therapies  4. Discharge Planning:   -- Social work and case management to assist with discharge planning and identification of hospital follow-up needs prior to discharge  -- Estimated LOS: 3-4 days  Allyn Foil, MD 11/05/2024, 1:02 PM

## 2024-11-05 NOTE — Group Note (Signed)
 LCSW Group Therapy Note  Group Date: 11/05/2024 Start Time: 1315 End Time: 1345   Type of Therapy and Topic:  Group Therapy: Self-Harm Alternatives  Participation Level:  Minimal   Description of Group:   Patients participated in a discussion regarding non-suicidal self-injurious behavior (NSSIB, or self-harm) and the stigma surrounding it. There was also discussion surrounding how other maladaptive coping skills could be seen as self-harm, such as substance abuse. Participants were invited to share their experiences with self-harm, with emphasis being placed on the motivation for self-harm (such as release, punishment, feeling numb, etc). Patients were then asked to brainstorm potential substitutions for self-harm and were provided with a handout entitled, Distraction Techniques and Alternative Coping Strategies, published by [employer] Self-Injury Recovery.  Therapeutic Goals:  Patients will be given the opportunity to discuss NSSIB in a non-judgmental and therapeutic environment. Patients will identify which feelings lead to NSSIB.  Patients will discuss potential healthy coping skills to replace NSSIB Open discussion will specifically address stigma and shame surrounding NSSIB.   Summary of Patient Progress:  Shari [name] was both present/active throughout the session and proved open to feedback from CSW and peers. Patient demonstrated fair insight into the subject matter, was respectful of peers, and was present throughout the entire session.  Therapeutic Modalities:   Cognitive Behavioral Therapy   Shari [name], [place] 11/05/2024  2:20 PM

## 2024-11-05 NOTE — Group Note (Signed)
 Date:  11/05/2024 Time:  4:22 PM  Group Topic/Focus:  Managing Feelings:   The focus of this group is to identify what feelings patients have difficulty handling and develop a plan to handle them in a healthier way upon discharge.    Participation Level:  Minimal  Participation Quality:  Appropriate  Affect:  Appropriate  Cognitive:  Appropriate  Insight: Appropriate  Engagement in Group:  Engaged  Modes of Intervention:  Discussion  Additional Comments:    Lavonne Cass K 11/05/2024, 4:22 PM

## 2024-11-05 NOTE — Progress Notes (Signed)
 Patient was cooperative, complained of R shoulder pain. Denied anxiety, depression, SI, HI &  AVH.   11/04/24 2000  Psych Admission Type (Psych Patients Only)  Admission Status Involuntary  Psychosocial Assessment  Patient Complaints None  Eye Contact Fair  Facial Expression Flat  Affect Appropriate to circumstance  Speech Logical/coherent  Interaction Minimal  Motor Activity Slow  Appearance/Hygiene Unremarkable  Behavior Characteristics Cooperative  Mood Pleasant  Thought Process  Coherency WDL  Content WDL  Delusions None reported or observed  Perception WDL  Hallucination None reported or observed  Judgment Impaired  Confusion Mild  Danger to Self  Current suicidal ideation? Denies  Agreement Not to Harm Self Yes  Description of Agreement verbal  Danger to Others  Danger to Others None reported or observed

## 2024-11-05 NOTE — Progress Notes (Signed)
 Behavior:  Pleasant and cooperative. No behavioral issues.   Psych assessment:  Flat facial expression. Denies SI/HI and AVH.  Interaction / Group attendance:  Present in the milieu.  Minimal interaction with peers and staff.  Attends some groups.   Medication/ PRNs: Compliant.  PRN medication for pain given as ordered.   Pain:  7/10 in shoulder  15 min checks in place for safety.

## 2024-11-05 NOTE — Plan of Care (Signed)
  Problem: Education: Goal: Knowledge of [facility] Education information/materials will improve Outcome: Progressing Goal: Mental status will improve Outcome: Progressing   

## 2024-11-05 NOTE — Plan of Care (Signed)
   Problem: Activity: Goal: Interest or engagement in activities will improve Outcome: Progressing   Problem: Health Behavior/Discharge Planning: Goal: Compliance with treatment plan for underlying cause of condition will improve Outcome: Progressing   Problem: Safety: Goal: Periods of time without injury will increase Outcome: Progressing

## 2024-11-05 NOTE — Group Note (Signed)
 Recreation Therapy Group Note   Group Topic:Relaxation  Group Date: 11/05/2024 Start Time: 1400 End Time: 1440 Facilitators: Celestia Jeoffrey BRAVO, LRT, CTRS Location: Dayroom  Group Description: PMR (Progressive Muscle Relaxation). LRT educates patients on what PMR is and the benefits that come from it. Patients are asked to sit with their feet flat on the floor while sitting up and all the way back in their chair, if possible. LRT and pts follow a prompt through a speaker that requires you to tense and release different muscles in their body and focus on their breathing. During session, lights are off and soft music is being played. Pts are given a stress ball to use if needed.   Goal Area(s) Addressed:  Patients will be able to describe progressive muscle relaxation.  Patient will practice using relaxation technique. Patient will identify a new coping skill.  Patient will follow multistep directions to reduce anxiety and stress.   Affect/Mood: Appropriate   Participation Level: Non-verbal    Clinical Observations/Individualized Feedback: Shari [name] was present in the dayroom at the time of group. Pt did not interact with LRT or peers while present. Pt did no complete any of the exercises as prompted.   Plan: Continue to engage patient in RT group sessions 2-3x/week.   Jeoffrey BRAVO Celestia, LRT, CTRS 11/05/2024 4:59 PM

## 2024-11-05 NOTE — Group Note (Signed)
 Date:  11/05/2024 Time:  6:16 PM  Group Topic/Focus:  Coping With Mental Health Crisis:   The purpose of this group is to help patients identify strategies for coping with mental health crisis.  Group discusses possible causes of crisis and ways to manage them effectively.    Participation Level:  Active  Participation Quality:  Appropriate  Affect:  Appropriate  Cognitive:  Appropriate  Insight: Appropriate  Engagement in Group:  Engaged  Modes of Intervention:  Activity  Additional Comments:    Krisanne Lich 11/05/2024, 6:16 PM

## 2024-11-06 NOTE — Group Note (Signed)
 Date:  11/06/2024 Time:  3:30 PM  Group Topic/Focus:  Movement therapy    Participation Level:  Active  Participation Quality:  Appropriate  Affect:  Appropriate  Cognitive:  Appropriate  Insight: Appropriate  Engagement in Group:  Engaged  Modes of Intervention:  Activity  Additional Comments:  none  [name] 11/06/2024, 3:30 PM

## 2024-11-06 NOTE — Group Note (Signed)
 Date:  11/06/2024 Time:  8:43 PM  Group Topic/Focus:  Wrap-Up Group:   The focus of this group is to help patients review their daily goal of treatment and discuss progress on daily workbooks.    Participation Level:  Active  Participation Quality:  Appropriate  Affect:  Appropriate  Cognitive:  Alert  Insight: Appropriate  Engagement in Group:  Engaged  Modes of Intervention:  Discussion  Additional Comments:    Shari [name] 11/06/2024, 8:43 PM

## 2024-11-06 NOTE — Plan of Care (Signed)

## 2024-11-06 NOTE — Progress Notes (Signed)
 [facility] MD Progress Note  11/06/2024 9:58 PM Shari [name]  MRN:  [id number] Patient is a 68 year old female with a history of Schizoaffective Disorder, Bipolar Type who presents via [facility] under IVC, initiated by CM, Falencio with re-entry program, to [facility] for assessment. Per IVC, [name] has been diagnosed with Schizophrenia and is refusing to take medication. [name] isn't sleeping. Residents report her screaming and yelling all night long.  [name] responding to internal stimuli.  [name] verbally aggressive.  The respondent has locked all of the residents inside of the transitional housing home.  They could not leave the home which prompted the IVC.  Residents did not have access to food, the restroom or anything as a result of being locked in.  [name] is currently under Adult protective services care. Patient is admitted to [facility] unit with Q15 min safety monitoring. Multidisciplinary team approach is offered. Medication management; group/milieu therapy is offered.   Subjective:  Chart reviewed, case discussed in multidisciplinary meeting, patient seen during rounds.   And is noted to be resting in her room.  She offers no complaints.  She denies having any physical problems.  [name] taking her meds with no problems.  She remains discharge focused and wants to know about her housing status.  She still claims the intruders in her house need to leave before she goes there.  [name] unable to comprehend that she was living in a transitional house with other residents.  Past Psychiatric History: see h&P Family History:  Family History  Problem Relation Age of Onset   Diabetes Mother    CAD Mother    Lung cancer Father    Social History:  Social History   Substance and Sexual Activity  Alcohol Use Yes   Comment: beer occasionally     Social History   Substance and Sexual Activity  Drug Use No    Social History   Socioeconomic History   Marital status:  Widowed    Spouse name: Not on file   Number of children: Not on file   Years of education: Not on file   Highest education level: Not on file  Occupational History   Not on file  Tobacco Use   Smoking status: Every Day    Current packs/day: 0.50    Average packs/day: 0.5 packs/day for 40.0 years (20.0 ttl pk-yrs)    Types: Cigarettes   Smokeless tobacco: Never  Vaping Use   Vaping status: Never Used  Substance and Sexual Activity   Alcohol use: Yes    Comment: beer occasionally   Drug use: No   Sexual activity: Not Currently    Birth control/protection: Post-menopausal  Other Topics Concern   Not on file  Social History Narrative   Not on file   Social Drivers of Health   Financial Resource Strain: Not on file  Food Insecurity: No Food Insecurity (10/17/2024)   Hunger Vital Sign    Worried About Running Out of Food in the Last Year: Never true    Ran Out of Food in the Last Year: Never true  Transportation Needs: No Transportation Needs (10/17/2024)   PRAPARE - Administrator, Civil Service (Medical): No    Lack of Transportation (Non-Medical): No  Recent Concern: Transportation Needs - Unmet Transportation Needs (10/14/2024)   PRAPARE - Administrator, Civil Service (Medical): Yes    Lack of Transportation (Non-Medical): Yes  Physical Activity: Not on  file  Stress: Not on file  Social Connections: Unknown (10/17/2024)   Social Connection and Isolation Panel    Frequency of Communication with Friends and Family: Patient unable to answer    Frequency of Social Gatherings with Friends and Family: Patient unable to answer    Attends Religious Services: Patient unable to answer    Active Member of Clubs or Organizations: Patient declined    Attends Banker Meetings: Patient unable to answer    Marital Status: Widowed   Past Medical History:  Past Medical History:  Diagnosis Date   Eye globe prosthesis    GERD (gastroesophageal  reflux disease)    History of blood transfusion 1973   related to abscess burst in my stomach   Hyperlipemia    Hyperlipidemia    Hypertension    Osteoarthritis of back    Lowerback    SVD (spontaneous vaginal delivery)    x 1, baby died at 2 wks of age   Type II diabetes mellitus (HCC)     Past Surgical History:  Procedure Laterality Date   APPENDECTOMY     COLONOSCOPY     DILATATION & CURETTAGE/HYSTEROSCOPY WITH MYOSURE N/A 02/25/2015   Procedure: DILATATION & CURETTAGE/HYSTEROSCOPY WITH MYOSURE;  Surgeon: Truman Corona, MD;  Location: [facility];  Service: Gynecology;  Laterality: N/A;   DILATION AND CURETTAGE OF UTERUS     ENUCLEATION Right 03/16/2017   ENUCLEATION Right 03/16/2017   Procedure: ENUCLEATION RIGHT EYE;  Surgeon: Loyd Kathryne Palm, MD;  Location: [facility] OR;  Service: Ophthalmology;  Laterality: Right;   EYE SURGERY     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6, 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   EYE SURGERY Right ~ 1974   S/P initial eye injury; scissors stuck in my eye   LAPAROSCOPIC CHOLECYSTECTOMY     SHOULDER ARTHROSCOPY WITH ROTATOR CUFF REPAIR Left    wire stitches per patient   SHOULDER ARTHROSCOPY WITH ROTATOR CUFF REPAIR Right     Current Medications: Current Facility-Administered Medications  Medication Dose Route Frequency Provider Last Rate Last Admin   acetaminophen  (TYLENOL ) tablet 650 mg  650 mg Oral Q6H PRN Tex Drilling, NP   650 mg at 11/05/24 2143   alum & mag hydroxide-simeth (MAALOX/MYLANTA) 200-200-20 MG/5ML suspension 30 mL  30 mL Oral Q4H PRN Tex Drilling, NP       cloNIDine  (CATAPRES ) tablet 0.1 mg  0.1 mg Oral BID PRN Tex Drilling, NP       divalproex  (DEPAKOTE ) DR tablet 750 mg  750 mg Oral Q12H Isbella Arline, MD   750 mg at 11/06/24 2130   losartan  (COZAAR ) tablet 25 mg  25 mg Oral Daily Nkwenti, Doris, NP   25 mg at 11/06/24 9080   magnesium  hydroxide (MILK OF MAGNESIA) suspension 30 mL  30 mL Oral Daily PRN Tex Drilling, NP       methimazole  (TAPAZOLE )  tablet 10 mg  10 mg Oral Daily Nkwenti, Doris, NP   10 mg at 11/06/24 9080   OLANZapine  (ZYPREXA ) injection 5 mg  5 mg Intramuscular TID PRN Tex Drilling, NP       OLANZapine  (ZYPREXA ) injection 5 mg  5 mg Intramuscular TID PRN Tex Drilling, NP       OLANZapine  (ZYPREXA ) tablet 20 mg  20 mg Oral QHS Sosie Gato, MD   20 mg at 11/06/24 2130   OLANZapine  zydis (ZYPREXA ) disintegrating tablet 5 mg  5 mg Oral TID PRN Tex Drilling, NP  OLANZapine  zydis (ZYPREXA ) disintegrating tablet 5 mg  5 mg Oral TID PRN Tex Drilling, NP       pantoprazole  (PROTONIX ) EC tablet 40 mg  40 mg Oral Daily Nkwenti, Doris, NP   40 mg at 11/06/24 0919   traZODone  (DESYREL ) tablet 50 mg  50 mg Oral QHS PRN Tex Drilling, NP   50 mg at 11/06/24 2131    Lab Results:  No results found for this or any previous visit (from the past 48 hours).     Blood Alcohol level:  Lab Results  Component Value Date   [facility] <15 10/14/2024   ETH <10 07/02/2022    Metabolic Disorder Labs: Lab Results  Component Value Date   HGBA1C 5.5 10/14/2024   MPG 111.15 10/14/2024   MPG 137 01/06/2022   Lab Results  Component Value Date   PROLACTIN 5.8 10/14/2024   Lab Results  Component Value Date   CHOL 167 10/14/2024   TRIG 68 10/14/2024   HDL 60 10/14/2024   CHOLHDL 2.8 10/14/2024   VLDL 14 10/14/2024   LDLCALC 93 10/14/2024   LDLCALC 73 03/13/2024    Physical Findings: AIMS:  , ,  ,  ,    CIWA:    COWS:      Psychiatric Specialty Exam:  Presentation  General Appearance:  Appropriate for Environment  Eye Contact: Fleeting  Speech: Normal Rate  Speech Volume: Decreased    Mood and Affect  Mood:fine  Affect: Flat   Thought Process  Thought Processes: impoverished  Orientation:Partial  Thought Content:Illogical; Paranoid Ideation  Hallucinations: Denies  Ideas of Reference:Delusions; Paranoia  Suicidal Thoughts: Denies  Homicidal Thoughts: Denies   Sensorium   Memory: Immediate Fair; Recent Fair  Judgment: Impaired  Insight: Shallow   Executive Functions  Concentration: Fair  Attention Span: Fair  Recall: Poor  Fund of Knowledge: Fair  Language: Fair   Psychomotor Activity  Psychomotor Activity: No data recorded  Musculoskeletal: Strength & Muscle Tone: within normal limits Gait & Station: normal Assets  Assets: Manufacturing Systems Engineer; Desire for Improvement; Social Support    Physical Exam: Physical Exam Vitals and nursing note reviewed.    ROS Blood pressure (!) 144/61, pulse 89, temperature 98.6 F (37 C), resp. rate 14, height 5' 8 (1.727 m), weight 48.3 kg, SpO2 98%. Body mass index is 16.19 kg/m.  Diagnosis: Principal Problem:   Schizophrenia, paranoid (HCC)    Treatment Plan Summary:  Safety and Monitoring:             -- Involuntary admission to inpatient psychiatric unit for safety, stabilization and treatment             -- Daily contact with patient to assess and evaluate symptoms and progress in treatment             -- Patient's case to be discussed in multi-disciplinary team meeting             -- Observation Level: q15 minute checks             -- Vital signs:  q12 hours             -- Precautions: suicide, elopement, and assault   2. Psychiatric Diagnoses and Treatment:            Depakote   750 mg twice daily Zyprexa  to 15 mg nightly     -- The risks/benefits/side-effects/alternatives to this medication were discussed in detail with the patient and time was given for questions. The  patient consents to medication trial.                -- Metabolic profile and EKG monitoring obtained while on an atypical antipsychotic (BMI: Lipid Panel: HbgA1c: QTc:)              -- Encouraged patient to participate in unit milieu and in scheduled group therapies  4. Discharge Planning:   -- Social work and case management to assist with discharge planning and identification of hospital follow-up  needs prior to discharge  -- Estimated LOS: 3-4 days  Allyn Foil, MD 11/06/2024, 9:58 PM

## 2024-11-06 NOTE — Progress Notes (Signed)
 Patient is an involuntary admission to [facility] for paranoid schizophrenia awaiting placement.  Patient is calm and cooperative and interacts well with staff and peers.  Patient ambulates without a walker and does her own ADL's. She denies SI, HI, AVH, anxiety and depression.  Her only request today was a throat lozenge for which the doctor was informed. Will continue to monitor.

## 2024-11-06 NOTE — Progress Notes (Signed)
   11/05/24 2100  Psych Admission Type (Psych Patients Only)  Admission Status Involuntary  Psychosocial Assessment  Patient Complaints None  Eye Contact Fair  Facial Expression Flat  Affect Appropriate to circumstance  Speech Logical/coherent  Interaction Minimal  Motor Activity Slow  Appearance/Hygiene Unremarkable;In scrubs  Behavior Characteristics Cooperative;Appropriate to situation  Mood Pleasant  Thought Process  Coherency WDL  Content WDL  Delusions None reported or observed  Perception WDL  Hallucination None reported or observed  Judgment Impaired  Confusion Mild  Danger to Self  Current suicidal ideation? Denies  Agreement Not to Harm Self Yes  Description of Agreement verbal  Danger to Others  Danger to Others None reported or observed   Mood/Behavior:  Pleasant and cooperative. Pt expressing I want a cigarette but, they said I cannot have one. Pt offered nicotine  gum and patches but, declined.    Psych assessment: Denies SI/HI and AVH.    Interaction / Group attendance:  Present in the milieu. Minimal interaction with peers and staff.  Attended group.   Medication/ PRNs: Compliant with scheduled medications. Required PRNs Trazodone  for sleep and noted effective and Tylenol  for shoulder pain noted effective.   Pain: Shoulder pain.    15 min checks in place for safety.

## 2024-11-06 NOTE — Group Note (Signed)
 Date:  11/06/2024 Time:  11:26 AM  Group Topic/Focus:  Personal Choices and Values:   The focus of this group is to help patients assess and explore the importance of values in their lives, how their values affect their decisions, how they express their values and what opposes their expression.    Participation Level:  Did Not Attend   Shari [name] Gavel 11/06/2024, 11:26 AM

## 2024-11-07 NOTE — Group Note (Signed)
 Date:  11/07/2024 Time:  8:45 PM  Group Topic/Focus:  Wrap-Up Group:   The focus of this group is to help patients review their daily goal of treatment and discuss progress on daily workbooks.    Participation Level:  Active  Participation Quality:  Appropriate  Affect:  Appropriate  Cognitive:  Appropriate  Insight: Appropriate  Engagement in Group:  Engaged  Modes of Intervention:  Activity and Discussion  Additional Comments:    [name] 11/07/2024, 8:45 PM

## 2024-11-07 NOTE — Progress Notes (Signed)
   11/06/24 2303  Psych Admission Type (Psych Patients Only)  Admission Status Involuntary  Psychosocial Assessment  Patient Complaints None  Eye Contact Fair  Facial Expression Flat  Affect Appropriate to circumstance  Speech Logical/coherent  Interaction Minimal  Motor Activity Slow  Appearance/Hygiene Unremarkable  Behavior Characteristics Cooperative  Mood Pleasant  Thought Process  Coherency WDL  Content WDL  Delusions None reported or observed  Perception WDL  Hallucination None reported or observed  Judgment Impaired  Confusion Mild  Danger to Self  Current suicidal ideation? Denies  Description of Suicide Plan yes  Agreement Not to Harm Self Yes  Description of Agreement verbal  Danger to Others  Danger to Others None reported or observed

## 2024-11-07 NOTE — Group Note (Signed)
 LCSW Group Therapy Note  Group Date: 11/07/2024 Start Time: 1320 End Time: 1445   Type of Therapy and Topic:  Group Therapy - Healthy vs Unhealthy Coping Skills  Participation Level:  Did Not Attend   Description of Group The focus of this group was to determine what unhealthy coping techniques typically are used by group members and what healthy coping techniques would be helpful in coping with various problems. Patients were guided in becoming aware of the differences between healthy and unhealthy coping techniques. Patients were asked to identify 2-3 healthy coping skills they would like to learn to use more effectively.  Therapeutic Goals Patients learned that coping is what human beings do all day long to deal with various situations in their lives Patients defined and discussed healthy vs unhealthy coping techniques Patients identified their preferred coping techniques and identified whether these were healthy or unhealthy Patients determined 2-3 healthy coping skills they would like to become more familiar with and use more often. Patients provided support and ideas to each other   Summary of Patient Progress: X   Therapeutic Modalities Cognitive Behavioral Therapy Motivational Interviewing  Shari [name], [place] 11/07/2024  2:49 PM

## 2024-11-07 NOTE — Group Note (Signed)
 Date:  11/07/2024 Time:  10:48 AM  Group Topic/Focus:  Managing Feelings:   The focus of this group is to identify what feelings patients have difficulty handling and develop a plan to handle them in a healthier way upon discharge.    Participation Level:  Did Not Attend  Shari [name] 11/07/2024, 10:48 AM

## 2024-11-07 NOTE — Progress Notes (Signed)
   11/07/24 0900  Psych Admission Type (Psych Patients Only)  Admission Status Involuntary  Psychosocial Assessment  Patient Complaints None  Eye Contact Fair  Facial Expression Flat  Affect Appropriate to circumstance  Speech Logical/coherent  Interaction Minimal  Motor Activity Slow  Appearance/Hygiene Unremarkable  Behavior Characteristics Cooperative  Mood Pleasant  Thought Process  Coherency WDL  Content WDL  Delusions None reported or observed  Perception WDL  Hallucination None reported or observed  Judgment Impaired  Confusion Mild  Danger to Self  Current suicidal ideation? Denies  Agreement Not to Harm Self Yes  Description of Agreement verbal  Danger to Others  Danger to Others None reported or observed

## 2024-11-07 NOTE — Progress Notes (Signed)
 [facility] MD Progress Note  11/07/2024 10:50 AM Shari [name]  MRN:  [id number] Patient is a 68 year old female with a history of Schizoaffective Disorder, Bipolar Type who presents via [employer] under IVC, initiated by CM, Falencio with re-entry program, to [facility] for assessment. Per IVC, [name] has been diagnosed with Schizophrenia and is refusing to take medication. [name] isn't sleeping. Residents report her screaming and yelling all night long.  [name] responding to internal stimuli.  [name] verbally aggressive.  The respondent has locked all of the residents inside of the transitional housing home.  They could not leave the home which prompted the IVC.  Residents did not have access to food, the restroom or anything as a result of being locked in.  [name] is currently under Adult protective services care. Patient is admitted to [facility] unit with Q15 min safety monitoring. Multidisciplinary team approach is offered. Medication management; group/milieu therapy is offered.   Subjective:  Chart reviewed, case discussed in multidisciplinary meeting, patient seen during rounds.   Patient is noted to be sitting in the day area.  She offers no complaints.  She denies SI/HI/plan and denies auditory/visual hallucinations.  She reports having fair appetite and sleep.  She offers no physical symptoms.  Per nursing patient is taking her medications with no reported side effects.  Social work team will be reaching out to APS for further updates on legal guardianship and placement Past Psychiatric History: see h&P Family History:  Family History  Problem Relation Age of Onset   Diabetes Mother    CAD Mother    Lung cancer Father    Social History:  Social History   Substance and Sexual Activity  Alcohol Use Yes   Comment: beer occasionally     Social History   Substance and Sexual Activity  Drug Use No    Social History   Socioeconomic History   Marital status:  Widowed    Spouse name: Not on file   Number of children: Not on file   Years of education: Not on file   Highest education level: Not on file  Occupational History   Not on file  Tobacco Use   Smoking status: Every Day    Current packs/day: 0.50    Average packs/day: 0.5 packs/day for 40.0 years (20.0 ttl pk-yrs)    Types: Cigarettes   Smokeless tobacco: Never  Vaping Use   Vaping status: Never Used  Substance and Sexual Activity   Alcohol use: Yes    Comment: beer occasionally   Drug use: No   Sexual activity: Not Currently    Birth control/protection: Post-menopausal  Other Topics Concern   Not on file  Social History Narrative   Not on file   Social Drivers of Health   Financial Resource Strain: Not on file  Food Insecurity: No Food Insecurity (10/17/2024)   Hunger Vital Sign    Worried About Running Out of Food in the Last Year: Never true    Ran Out of Food in the Last Year: Never true  Transportation Needs: No Transportation Needs (10/17/2024)   PRAPARE - Administrator, Civil Service (Medical): No    Lack of Transportation (Non-Medical): No  Recent Concern: Transportation Needs - Unmet Transportation Needs (10/14/2024)   PRAPARE - Administrator, Civil Service (Medical): Yes    Lack of Transportation (Non-Medical): Yes  Physical Activity: Not on file  Stress: Not on file  Social Connections:  Unknown (10/17/2024)   Social Connection and Isolation Panel    Frequency of Communication with Friends and Family: Patient unable to answer    Frequency of Social Gatherings with Friends and Family: Patient unable to answer    Attends Religious Services: Patient unable to answer    Active Member of Clubs or Organizations: Patient declined    Attends Banker Meetings: Patient unable to answer    Marital Status: Widowed   Past Medical History:  Past Medical History:  Diagnosis Date   Eye globe prosthesis    GERD (gastroesophageal  reflux disease)    History of blood transfusion 1973   related to abscess burst in my stomach   Hyperlipemia    Hyperlipidemia    Hypertension    Osteoarthritis of back    Lowerback    SVD (spontaneous vaginal delivery)    x 1, baby died at 2 wks of age   Type II diabetes mellitus (HCC)     Past Surgical History:  Procedure Laterality Date   APPENDECTOMY     COLONOSCOPY     DILATATION & CURETTAGE/HYSTEROSCOPY WITH MYOSURE N/A 02/25/2015   Procedure: DILATATION & CURETTAGE/HYSTEROSCOPY WITH MYOSURE;  Surgeon: Truman Corona, MD;  Location: [facility];  Service: Gynecology;  Laterality: N/A;   DILATION AND CURETTAGE OF UTERUS     ENUCLEATION Right 03/16/2017   ENUCLEATION Right 03/16/2017   Procedure: ENUCLEATION RIGHT EYE;  Surgeon: Loyd Kathryne Palm, MD;  Location: [facility] OR;  Service: Ophthalmology;  Laterality: Right;   EYE SURGERY     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6, 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   EYE SURGERY Right ~ 1974   S/P initial eye injury; scissors stuck in my eye   LAPAROSCOPIC CHOLECYSTECTOMY     SHOULDER ARTHROSCOPY WITH ROTATOR CUFF REPAIR Left    wire stitches per patient   SHOULDER ARTHROSCOPY WITH ROTATOR CUFF REPAIR Right     Current Medications: Current Facility-Administered Medications  Medication Dose Route Frequency Provider Last Rate Last Admin   acetaminophen  (TYLENOL ) tablet 650 mg  650 mg Oral Q6H PRN Tex Drilling, NP   650 mg at 11/05/24 2143   alum & mag hydroxide-simeth (MAALOX/MYLANTA) 200-200-20 MG/5ML suspension 30 mL  30 mL Oral Q4H PRN Nkwenti, Drilling, NP       cloNIDine  (CATAPRES ) tablet 0.1 mg  0.1 mg Oral BID PRN Tex Drilling, NP       divalproex  (DEPAKOTE ) DR tablet 750 mg  750 mg Oral Q12H Brendalee Matthies, MD   750 mg at 11/07/24 9076   losartan  (COZAAR ) tablet 25 mg  25 mg Oral Daily Nkwenti, Doris, NP   25 mg at 11/07/24 9075   magnesium  hydroxide (MILK OF MAGNESIA) suspension 30 mL  30 mL Oral Daily PRN Tex Drilling, NP       methimazole  (TAPAZOLE )  tablet 10 mg  10 mg Oral Daily Nkwenti, Doris, NP   10 mg at 11/07/24 9076   OLANZapine  (ZYPREXA ) injection 5 mg  5 mg Intramuscular TID PRN Tex Drilling, NP       OLANZapine  (ZYPREXA ) injection 5 mg  5 mg Intramuscular TID PRN Tex Drilling, NP       OLANZapine  (ZYPREXA ) tablet 20 mg  20 mg Oral QHS Coree Brame, MD   20 mg at 11/06/24 2130   OLANZapine  zydis (ZYPREXA ) disintegrating tablet 5 mg  5 mg Oral TID PRN Tex Drilling, NP       OLANZapine  zydis (ZYPREXA ) disintegrating tablet 5 mg  5  mg Oral TID PRN Tex Drilling, NP       pantoprazole  (PROTONIX ) EC tablet 40 mg  40 mg Oral Daily Nkwenti, Doris, NP   40 mg at 11/07/24 9076   traZODone  (DESYREL ) tablet 50 mg  50 mg Oral QHS PRN Tex Drilling, NP   50 mg at 11/06/24 2131    Lab Results:  No results found for this or any previous visit (from the past 48 hours).     Blood Alcohol level:  Lab Results  Component Value Date   [facility] <15 10/14/2024   ETH <10 07/02/2022    Metabolic Disorder Labs: Lab Results  Component Value Date   HGBA1C 5.5 10/14/2024   MPG 111.15 10/14/2024   MPG 137 01/06/2022   Lab Results  Component Value Date   PROLACTIN 5.8 10/14/2024   Lab Results  Component Value Date   CHOL 167 10/14/2024   TRIG 68 10/14/2024   HDL 60 10/14/2024   CHOLHDL 2.8 10/14/2024   VLDL 14 10/14/2024   LDLCALC 93 10/14/2024   LDLCALC 73 03/13/2024    Physical Findings: AIMS:  , ,  ,  ,    CIWA:    COWS:      Psychiatric Specialty Exam:  Presentation  General Appearance:  Appropriate for Environment  Eye Contact: Fleeting  Speech: Normal Rate  Speech Volume: Decreased    Mood and Affect  Mood:fine  Affect: Flat   Thought Process  Thought Processes: impoverished  Orientation:Partial  Thought Content:Illogical; Paranoid Ideation  Hallucinations: Denies  Ideas of Reference:Delusions; Paranoia  Suicidal Thoughts: Denies  Homicidal Thoughts: Denies   Sensorium   Memory: Immediate Fair; Recent Fair  Judgment: Impaired  Insight: Shallow   Executive Functions  Concentration: Fair  Attention Span: Fair  Recall: Poor  Fund of Knowledge: Fair  Language: Fair   Psychomotor Activity  Psychomotor Activity: No data recorded  Musculoskeletal: Strength & Muscle Tone: within normal limits Gait & Station: normal Assets  Assets: Manufacturing Systems Engineer; Desire for Improvement; Social Support    Physical Exam: Physical Exam Vitals and nursing note reviewed.    ROS Blood pressure (!) 129/56, pulse 81, temperature 98.1 F (36.7 C), resp. rate 14, height 5' 8 (1.727 m), weight 48.3 kg, SpO2 99%. Body mass index is 16.19 kg/m.  Diagnosis: Principal Problem:   Schizophrenia, paranoid (HCC)    Treatment Plan Summary:  Safety and Monitoring:             -- Involuntary admission to inpatient psychiatric unit for safety, stabilization and treatment             -- Daily contact with patient to assess and evaluate symptoms and progress in treatment             -- Patient's case to be discussed in multi-disciplinary team meeting             -- Observation Level: q15 minute checks             -- Vital signs:  q12 hours             -- Precautions: suicide, elopement, and assault   2. Psychiatric Diagnoses and Treatment:            Depakote   750 mg twice daily Zyprexa  to 15 mg nightly     -- The risks/benefits/side-effects/alternatives to this medication were discussed in detail with the patient and time was given for questions. The patient consents to medication trial.                --  Metabolic profile and EKG monitoring obtained while on an atypical antipsychotic (BMI: Lipid Panel: HbgA1c: QTc:)              -- Encouraged patient to participate in unit milieu and in scheduled group therapies  4. Discharge Planning:   -- Social work and case management to assist with discharge planning and identification of hospital follow-up  needs prior to discharge  -- Estimated LOS: 3-4 days  Allyn Foil, MD 11/07/2024, 10:50 AM

## 2024-11-07 NOTE — Group Note (Signed)
 Recreation Therapy Group Note   Group Topic:Communication  Group Date: 11/07/2024 Start Time: 1400 End Time: 1450 Facilitators: Celestia Jeoffrey BRAVO, LRT, CTRS Location: Dayroom  Group Description: Reminisce Conversation Cards. Patients drew a laminated card out of a bag that had a word or phrase on it. Pt encouraged to speak about a time in their life or fond memory that specifically relates to the word they chose out of the bag. An example would be: "parenthood, meals, siblings, travel, or home".  LRT prompted following questions and encouraged contribution from peers to increase communication.   Goal Area(s) Addressed: Patient will increase verbal communication by conversing with peers. Patient will contribute to group discussion with minimal prompting. Patient will reminisce a positive memory or moment in their life.   Affect/Mood: Appropriate   Participation Level: Non-verbal    Clinical Observations/Individualized Feedback: Shari [name] was in and out of group. Pt did not interact with LRT or peers while present.   Plan: Continue to engage patient in RT group sessions 2-3x/week.   Jeoffrey BRAVO Celestia, LRT, CTRS 11/07/2024 4:52 PM

## 2024-11-07 NOTE — Plan of Care (Signed)
   Problem: Education: Goal: Emotional status will improve Outcome: Progressing   Problem: Activity: Goal: Sleeping patterns will improve Outcome: Progressing   Problem: Coping: Goal: Ability to verbalize frustrations and anger appropriately will improve Outcome: Progressing

## 2024-11-08 DIAGNOSIS — F200 Paranoid schizophrenia: Secondary | ICD-10-CM | POA: Diagnosis not present

## 2024-11-08 NOTE — Progress Notes (Signed)
 [facility] MD Progress Note  11/08/2024 12:18 PM Shari [name]  MRN:  [id number] Patient is a 68 year old female with a history of Schizoaffective Disorder, Bipolar Type who presents via [employer] under IVC, initiated by CM, Falencio with re-entry program, to [facility] for assessment. Per IVC, [name] has been diagnosed with Schizophrenia and is refusing to take medication. [name] isn't sleeping. Residents report her screaming and yelling all night long.  [name] responding to internal stimuli.  [name] verbally aggressive.  The respondent has locked all of the residents inside of the transitional housing home.  They could not leave the home which prompted the IVC.  Residents did not have access to food, the restroom or anything as a result of being locked in.  [name] is currently under Adult protective services care. Patient is admitted to [facility] unit with Q15 min safety monitoring. Multidisciplinary team approach is offered. Medication management; group/milieu therapy is offered.   Subjective:  Chart reviewed, case discussed in multidisciplinary meeting, patient seen during rounds.   Patient was seen face-to-face today she denies any suicidal or homicidal thoughts offers no complaints is waiting for placement  Past Psychiatric History: see h&P Family History:  Family History  Problem Relation Age of Onset   Diabetes Mother    CAD Mother    Lung cancer Father    Social History:  Social History   Substance and Sexual Activity  Alcohol Use Yes   Comment: beer occasionally     Social History   Substance and Sexual Activity  Drug Use No    Social History   Socioeconomic History   Marital status: Widowed    Spouse name: Not on file   Number of children: Not on file   Years of education: Not on file   Highest education level: Not on file  Occupational History   Not on file  Tobacco Use   Smoking status: Every Day    Current packs/day: 0.50    Average  packs/day: 0.5 packs/day for 40.0 years (20.0 ttl pk-yrs)    Types: Cigarettes   Smokeless tobacco: Never  Vaping Use   Vaping status: Never Used  Substance and Sexual Activity   Alcohol use: Yes    Comment: beer occasionally   Drug use: No   Sexual activity: Not Currently    Birth control/protection: Post-menopausal  Other Topics Concern   Not on file  Social History Narrative   Not on file   Social Drivers of Health   Financial Resource Strain: Not on file  Food Insecurity: No Food Insecurity (10/17/2024)   Hunger Vital Sign    Worried About Running Out of Food in the Last Year: Never true    Ran Out of Food in the Last Year: Never true  Transportation Needs: No Transportation Needs (10/17/2024)   PRAPARE - Administrator, Civil Service (Medical): No    Lack of Transportation (Non-Medical): No  Recent Concern: Transportation Needs - Unmet Transportation Needs (10/14/2024)   PRAPARE - Administrator, Civil Service (Medical): Yes    Lack of Transportation (Non-Medical): Yes  Physical Activity: Not on file  Stress: Not on file  Social Connections: Unknown (10/17/2024)   Social Connection and Isolation Panel    Frequency of Communication with Friends and Family: Patient unable to answer    Frequency of Social Gatherings with Friends and Family: Patient unable to answer    Attends Religious Services: Patient unable to answer  Active Member of Clubs or Organizations: Patient declined    Attends Banker Meetings: Patient unable to answer    Marital Status: Widowed   Past Medical History:  Past Medical History:  Diagnosis Date   Eye globe prosthesis    GERD (gastroesophageal reflux disease)    History of blood transfusion 1973   related to abscess burst in my stomach   Hyperlipemia    Hyperlipidemia    Hypertension    Osteoarthritis of back    Lowerback    SVD (spontaneous vaginal delivery)    x 1, baby died at 2 wks of age    Type II diabetes mellitus (HCC)     Past Surgical History:  Procedure Laterality Date   APPENDECTOMY     COLONOSCOPY     DILATATION & CURETTAGE/HYSTEROSCOPY WITH MYOSURE N/A 02/25/2015   Procedure: DILATATION & CURETTAGE/HYSTEROSCOPY WITH MYOSURE;  Surgeon: Truman Corona, MD;  Location: [facility];  Service: Gynecology;  Laterality: N/A;   DILATION AND CURETTAGE OF UTERUS     ENUCLEATION Right 03/16/2017   ENUCLEATION Right 03/16/2017   Procedure: ENUCLEATION RIGHT EYE;  Surgeon: Loyd Kathryne Palm, MD;  Location: [facility] OR;  Service: Ophthalmology;  Laterality: Right;   EYE SURGERY     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6, 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   EYE SURGERY Right ~ 1974   S/P initial eye injury; scissors stuck in my eye   LAPAROSCOPIC CHOLECYSTECTOMY     SHOULDER ARTHROSCOPY WITH ROTATOR CUFF REPAIR Left    wire stitches per patient   SHOULDER ARTHROSCOPY WITH ROTATOR CUFF REPAIR Right     Current Medications: Current Facility-Administered Medications  Medication Dose Route Frequency Provider Last Rate Last Admin   acetaminophen  (TYLENOL ) tablet 650 mg  650 mg Oral Q6H PRN Tex Drilling, NP   650 mg at 11/05/24 2143   alum & mag hydroxide-simeth (MAALOX/MYLANTA) 200-200-20 MG/5ML suspension 30 mL  30 mL Oral Q4H PRN Tex Drilling, NP       cloNIDine  (CATAPRES ) tablet 0.1 mg  0.1 mg Oral BID PRN Tex Drilling, NP       divalproex  (DEPAKOTE ) DR tablet 750 mg  750 mg Oral Q12H Jadapalle, Sree, MD   750 mg at 11/08/24 9090   losartan  (COZAAR ) tablet 25 mg  25 mg Oral Daily Nkwenti, Doris, NP   25 mg at 11/08/24 0910   magnesium  hydroxide (MILK OF MAGNESIA) suspension 30 mL  30 mL Oral Daily PRN Tex Drilling, NP       methimazole  (TAPAZOLE ) tablet 10 mg  10 mg Oral Daily Nkwenti, Doris, NP   10 mg at 11/08/24 9090   OLANZapine  (ZYPREXA ) injection 5 mg  5 mg Intramuscular TID PRN Tex Drilling, NP       OLANZapine  (ZYPREXA ) injection 5 mg  5 mg Intramuscular TID PRN Tex Drilling, NP       OLANZapine   (ZYPREXA ) tablet 20 mg  20 mg Oral QHS Jadapalle, Sree, MD   20 mg at 11/07/24 2039   OLANZapine  zydis (ZYPREXA ) disintegrating tablet 5 mg  5 mg Oral TID PRN Tex Drilling, NP       OLANZapine  zydis (ZYPREXA ) disintegrating tablet 5 mg  5 mg Oral TID PRN Tex Drilling, NP       pantoprazole  (PROTONIX ) EC tablet 40 mg  40 mg Oral Daily Nkwenti, Doris, NP   40 mg at 11/08/24 9090   traZODone  (DESYREL ) tablet 50 mg  50 mg Oral QHS PRN Tex Drilling, NP  50 mg at 11/07/24 2039    Lab Results:  No results found for this or any previous visit (from the past 48 hours).     Blood Alcohol level:  Lab Results  Component Value Date   [facility] <15 10/14/2024   ETH <10 07/02/2022    Metabolic Disorder Labs: Lab Results  Component Value Date   HGBA1C 5.5 10/14/2024   MPG 111.15 10/14/2024   MPG 137 01/06/2022   Lab Results  Component Value Date   PROLACTIN 5.8 10/14/2024   Lab Results  Component Value Date   CHOL 167 10/14/2024   TRIG 68 10/14/2024   HDL 60 10/14/2024   CHOLHDL 2.8 10/14/2024   VLDL 14 10/14/2024   LDLCALC 93 10/14/2024   LDLCALC 73 03/13/2024    Physical Findings: AIMS:  , ,  ,  ,    CIWA:    COWS:      Psychiatric Specialty Exam:  Presentation  General Appearance:  Appropriate for Environment  Eye Contact: Fleeting  Speech: Normal Rate  Speech Volume: Decreased    Mood and Affect  Mood:fine  Affect: Flat   Thought Process  Thought Processes: impoverished  Orientation:Partial  Thought Content:Illogical; Paranoid Ideation  Hallucinations: Denies  Ideas of Reference:Delusions; Paranoia  Suicidal Thoughts: Denies  Homicidal Thoughts: Denies   Sensorium  Memory: Immediate Fair; Recent Fair  Judgment: Impaired  Insight: Shallow   Executive Functions  Concentration: Fair  Attention Span: Fair  Recall: Poor  Fund of Knowledge: Fair  Language: Fair   Psychomotor Activity  Psychomotor Activity: No data  recorded  Musculoskeletal: Strength & Muscle Tone: within normal limits Gait & Station: normal Assets  Assets: Manufacturing Systems Engineer; Desire for Improvement; Social Support    Physical Exam: Physical Exam Vitals and nursing note reviewed.    ROS Blood pressure 117/70, pulse 96, temperature 98.8 F (37.1 C), resp. rate 18, height 5' 8 (1.727 m), weight 48.3 kg, SpO2 97%. Body mass index is 16.19 kg/m.  Diagnosis: Principal Problem:   Schizophrenia, paranoid (HCC)    Treatment Plan Summary:  Safety and Monitoring:             -- Involuntary admission to inpatient psychiatric unit for safety, stabilization and treatment             -- Daily contact with patient to assess and evaluate symptoms and progress in treatment             -- Patient's case to be discussed in multi-disciplinary team meeting             -- Observation Level: q15 minute checks             -- Vital signs:  q12 hours             -- Precautions: suicide, elopement, and assault   2. Psychiatric Diagnoses and Treatment:            Depakote   750 mg twice daily Zyprexa  to 15 mg nightly     -- The risks/benefits/side-effects/alternatives to this medication were discussed in detail with the patient and time was given for questions. The patient consents to medication trial.                -- Metabolic profile and EKG monitoring obtained while on an atypical antipsychotic (BMI: Lipid Panel: HbgA1c: QTc:)              -- Encouraged patient to participate in unit milieu and in scheduled group  therapies  4. Discharge Planning:   -- Social work and case management to assist with discharge planning and identification of hospital follow-up needs prior to discharge  -- Estimated LOS: 3-4 days  Millie JONELLE Manners, MD 11/08/2024, 12:18 PM

## 2024-11-08 NOTE — Progress Notes (Incomplete)
   11/08/24 2131  Psych Admission Type (Psych Patients Only)  Admission Status Involuntary  Psychosocial Assessment  Patient Complaints None  Eye Contact Fair  Facial Expression Flat  Affect Appropriate to circumstance  Speech Logical/coherent  Interaction Minimal  Motor Activity Slow  Appearance/Hygiene Unremarkable  Behavior Characteristics Cooperative  Mood Pleasant  Thought Process  Coherency WDL  Content WDL  Delusions None reported or observed  Perception WDL  Hallucination None reported or observed  Judgment Impaired  Confusion Mild  Danger to Self  Current suicidal ideation? Denies  Agreement Not to Harm Self Yes  Description of Agreement verbal  Danger to Others  Danger to Others None reported or observed

## 2024-11-08 NOTE — Progress Notes (Signed)
   11/07/24 1938  Psych Admission Type (Psych Patients Only)  Admission Status Involuntary  Psychosocial Assessment  Patient Complaints None  Eye Contact Fair  Facial Expression Flat  Affect Appropriate to circumstance  Speech Logical/coherent  Interaction Minimal  Motor Activity Slow  Appearance/Hygiene Unremarkable  Behavior Characteristics Cooperative  Mood Pleasant  Thought Process  Coherency WDL  Content WDL  Delusions None reported or observed  Perception WDL  Hallucination None reported or observed  Judgment Impaired  Confusion Mild  Danger to Self  Current suicidal ideation? Denies  Agreement Not to Harm Self Yes  Description of Agreement verbal  Danger to Others  Danger to Others None reported or observed

## 2024-11-08 NOTE — Plan of Care (Signed)

## 2024-11-08 NOTE — Group Note (Signed)
 Date:  11/08/2024 Time:  8:42 PM  Group Topic/Focus:  Goals Group:   The focus of this group is to help patients establish daily goals to achieve during treatment and discuss how the patient can incorporate goal setting into their daily lives to aide in recovery.    Participation Level:  Active  Participation Quality:  Appropriate  Affect:  Appropriate  Cognitive:  Appropriate  Insight: Appropriate  Engagement in Group:  Engaged  Modes of Intervention:  Discussion  Additional Comments:    Shari [name] 11/08/2024, 8:42 PM

## 2024-11-08 NOTE — Group Note (Signed)
 Physical/Occupational Therapy Group Note  Group Topic: Neurographic Art  Group Date: 11/08/2024 Start Time: 1300 End Time: 1400 Facilitators: Clive Warren CROME, OT   Group Description: Group participated with Neurographic art activity, using watercolor paints to facilitate creative expression and meditation/relaxation for each individual.  Incorporated bimanual coordination, mental focus, emotional processing, task/command following and relaxation techniques as appropriate.  Patients engaged socially with therapist and other group participants throughout session. Allowed to ask questions as appropriate, and encouraged to identify ways they could use/share their creations with themselves and others.  Therapeutic Goal(s):  Demonstrate ability to independently manipulate utensils required to participate with and complete activity. Demonstrate ability to cognitively focus on task and follow commands necessary for completion. Demonstrate use of art as an outlet for emotional processing and expression. Identify and demonstrate importance of relaxation, neural calming and meditation for improved participation with life groups.  Individual Participation: Pt agreeable to participate and receptive to instruction throughout. Required minimal to moderate multimodal cues for initial instructions and set up of lines on the page but independence improved once she began painting. Engaged minimally in group discussion with prompting.   Participation Level: Active and Engaged   Participation Quality: Minimal Cues and Moderate Cues   Behavior: Alert, Appropriate, Calm, and Cooperative   Speech/Thought Process: Relevant   Affect/Mood: Appropriate and Stable    Insight: Moderate   Judgement: Moderate   Modes of Intervention: Activity, Clarification, Discussion, Education, Exploration, Problem-solving, Rapport Building, Socialization, and Support  Patient Response to Interventions:  Attentive, Engaged, and  Receptive   Plan: Continue to engage patient in PT/OT groups 1 - 2x/week.  Holbert Caples R., MPH, MS, OTR/L ascom [phone] 11/08/24, 2:59 PM

## 2024-11-08 NOTE — Group Note (Signed)
 Date:  11/08/2024 Time:  4:44 PM  Group Topic/Focus:  Rediscovering Joy:   The focus of this group is to explore various ways to relieve stress in a positive manner.    Participation Level:  Minimal  Participation Quality:  Appropriate  Affect:  Flat  Cognitive:  Alert  Insight: Limited  Engagement in Group:  Limited  Modes of Intervention:  Discussion  Additional Comments:     Maglione,Rhonin Trott E 11/08/2024, 4:44 PM

## 2024-11-08 NOTE — Group Note (Signed)
 Date:  11/08/2024 Time:  11:03 AM  Group Topic/Focus:  Making Healthy Choices:   The focus of this group is to help patients identify negative/unhealthy choices they were using prior to admission and identify positive/healthier coping strategies to replace them upon discharge.    Participation Level:  Active  Participation Quality:  Appropriate  Affect:  Appropriate  Cognitive:  Appropriate  Insight: Appropriate  Engagement in Group:  Engaged  Modes of Intervention:  Discussion   Shari [name] 11/08/2024, 11:03 AM

## 2024-11-08 NOTE — Progress Notes (Signed)
   11/08/24 2131  Psych Admission Type (Psych Patients Only)  Admission Status Involuntary  Psychosocial Assessment  Patient Complaints None  Eye Contact Fair  Facial Expression Flat  Affect Appropriate to circumstance  Speech Logical/coherent  Interaction Minimal  Motor Activity Slow  Appearance/Hygiene Unremarkable  Behavior Characteristics Cooperative  Mood Pleasant  Thought Process  Coherency WDL  Content WDL  Delusions None reported or observed  Perception WDL  Hallucination None reported or observed  Judgment Impaired  Confusion Mild  Danger to Self  Current suicidal ideation? Denies  Agreement Not to Harm Self Yes  Description of Agreement verbal  Danger to Others  Danger to Others None reported or observed

## 2024-11-08 NOTE — Group Note (Signed)
 Recreation Therapy Group Note   Group Topic:Leisure Education  Group Date: 11/08/2024 Start Time: 1415 End Time: 1440 Facilitators: Celestia Jeoffrey BRAVO, LRT, CTRS Location: Dayroom  Group Description: Music. Patients are encouraged to name their favorite song(s) for LRT to play song through speaker for group to hear, while in the courtyard getting fresh air and sunlight. Patients educated on the definition of leisure and the importance of having different leisure interests outside of the hospital. Group discussed how leisure activities can often be used as pharmacologist and that listening to music and being outside are examples.    Goal Area(s) Addressed:  Patient will identify a current leisure interest.  Patient will practice making a positive decision. Patient will have the opportunity to try a new leisure activity.   Affect/Mood: Flat   Participation Level: Minimal    Clinical Observations/Individualized Feedback: Shari [name] was present in the dayroom at the time of group. Pt shared that she didn't have a favorite song or anything she wanted to listen to. Pt was noted to be talking to herself while in group.   Plan: Continue to engage patient in RT group sessions 2-3x/week.   Jeoffrey BRAVO Celestia, LRT, CTRS 11/08/2024 5:06 PM

## 2024-11-08 NOTE — Progress Notes (Signed)
   11/08/24 1400  Psych Admission Type (Psych Patients Only)  Admission Status Involuntary  Psychosocial Assessment  Patient Complaints None  Eye Contact Fair  Facial Expression Flat  Affect Appropriate to circumstance  Speech Logical/coherent  Interaction Minimal  Motor Activity Slow  Appearance/Hygiene Unremarkable  Behavior Characteristics Cooperative  Mood Pleasant  Thought Process  Coherency WDL  Content WDL  Delusions None reported or observed  Perception WDL  Hallucination None reported or observed  Judgment Impaired  Confusion Mild  Danger to Self  Current suicidal ideation? Denies  Agreement Not to Harm Self Yes  Description of Agreement verbal  Danger to Others  Danger to Others None reported or observed

## 2024-11-08 NOTE — Plan of Care (Signed)
  Problem: Safety: Goal: Periods of time without injury will increase Outcome: Progressing   Problem: Coping: Goal: Ability to verbalize frustrations and anger appropriately will improve Outcome: Progressing   Problem: Education: Goal: Mental status will improve Outcome: Progressing

## 2024-11-09 NOTE — Plan of Care (Signed)
   Problem: Activity: Goal: Interest or engagement in activities will improve Outcome: Progressing   Problem: Coping: Goal: Ability to demonstrate self-control will improve Outcome: Progressing   Problem: Safety: Goal: Periods of time without injury will increase Outcome: Progressing

## 2024-11-09 NOTE — Progress Notes (Signed)
 [facility] MD Progress Note  11/09/2024 11:58 AM Shari [name]  MRN:  [id number] Patient is a 68 year old female with a history of Schizoaffective Disorder, Bipolar Type who presents via [facility] under IVC, initiated by CM, Falencio with re-entry program, to [facility] for assessment. Per IVC, [name] has been diagnosed with Schizophrenia and is refusing to take medication. [name] isn't sleeping. Residents report her screaming and yelling all night long.  [name] responding to internal stimuli.  [name] verbally aggressive.  The respondent has locked all of the residents inside of the transitional housing home.  They could not leave the home which prompted the IVC.  Residents did not have access to food, the restroom or anything as a result of being locked in.  [name] is currently under Adult protective services care. Patient is admitted to [facility] unit with Q15 min safety monitoring. Multidisciplinary team approach is offered. Medication management; group/milieu therapy is offered.   Subjective:  Chart reviewed, case discussed in multidisciplinary meeting, patient seen during rounds.   Patient is noted to be sitting in the day area.  She offers no complaints.  She does talk about some leg pain but had no falls recently.  She denies auditory/visual hallucinations.  She denies SI/HI/plan.  [name] taking her medications with no problems.  Patient remains discharge focused and still wants to get back to her house which is a transitional living place.  She continues to make comments about the people in the house as intruders.  Team is waiting to work with APS regarding guardianship and placement. Past Psychiatric History: see h&P Family History:  Family History  Problem Relation Age of Onset   Diabetes Mother    CAD Mother    Lung cancer Father    Social History:  Social History   Substance and Sexual Activity  Alcohol Use Yes   Comment: beer occasionally     Social History    Substance and Sexual Activity  Drug Use No    Social History   Socioeconomic History   Marital status: Widowed    Spouse name: Not on file   Number of children: Not on file   Years of education: Not on file   Highest education level: Not on file  Occupational History   Not on file  Tobacco Use   Smoking status: Every Day    Current packs/day: 0.50    Average packs/day: 0.5 packs/day for 40.0 years (20.0 ttl pk-yrs)    Types: Cigarettes   Smokeless tobacco: Never  Vaping Use   Vaping status: Never Used  Substance and Sexual Activity   Alcohol use: Yes    Comment: beer occasionally   Drug use: No   Sexual activity: Not Currently    Birth control/protection: Post-menopausal  Other Topics Concern   Not on file  Social History Narrative   Not on file   Social Drivers of Health   Financial Resource Strain: Not on file  Food Insecurity: No Food Insecurity (10/17/2024)   Hunger Vital Sign    Worried About Running Out of Food in the Last Year: Never true    Ran Out of Food in the Last Year: Never true  Transportation Needs: No Transportation Needs (10/17/2024)   PRAPARE - Administrator, Civil Service (Medical): No    Lack of Transportation (Non-Medical): No  Recent Concern: Transportation Needs - Unmet Transportation Needs (10/14/2024)   PRAPARE - Transportation    Lack of Transportation (  Medical): Yes    Lack of Transportation (Non-Medical): Yes  Physical Activity: Not on file  Stress: Not on file  Social Connections: Unknown (10/17/2024)   Social Connection and Isolation Panel    Frequency of Communication with Friends and Family: Patient unable to answer    Frequency of Social Gatherings with Friends and Family: Patient unable to answer    Attends Religious Services: Patient unable to answer    Active Member of Clubs or Organizations: Patient declined    Attends Banker Meetings: Patient unable to answer    Marital Status: Widowed    Past Medical History:  Past Medical History:  Diagnosis Date   Eye globe prosthesis    GERD (gastroesophageal reflux disease)    History of blood transfusion 1973   related to abscess burst in my stomach   Hyperlipemia    Hyperlipidemia    Hypertension    Osteoarthritis of back    Lowerback    SVD (spontaneous vaginal delivery)    x 1, baby died at 2 wks of age   Type II diabetes mellitus (HCC)     Past Surgical History:  Procedure Laterality Date   APPENDECTOMY     COLONOSCOPY     DILATATION & CURETTAGE/HYSTEROSCOPY WITH MYOSURE N/A 02/25/2015   Procedure: DILATATION & CURETTAGE/HYSTEROSCOPY WITH MYOSURE;  Surgeon: Truman Corona, MD;  Location: [facility];  Service: Gynecology;  Laterality: N/A;   DILATION AND CURETTAGE OF UTERUS     ENUCLEATION Right 03/16/2017   ENUCLEATION Right 03/16/2017   Procedure: ENUCLEATION RIGHT EYE;  Surgeon: Loyd Kathryne Palm, MD;  Location: [facility] OR;  Service: Ophthalmology;  Laterality: Right;   EYE SURGERY     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6, 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   EYE SURGERY Right ~ 1974   S/P initial eye injury; scissors stuck in my eye   LAPAROSCOPIC CHOLECYSTECTOMY     SHOULDER ARTHROSCOPY WITH ROTATOR CUFF REPAIR Left    wire stitches per patient   SHOULDER ARTHROSCOPY WITH ROTATOR CUFF REPAIR Right     Current Medications: Current Facility-Administered Medications  Medication Dose Route Frequency Provider Last Rate Last Admin   acetaminophen  (TYLENOL ) tablet 650 mg  650 mg Oral Q6H PRN Tex Drilling, NP   650 mg at 11/09/24 0838   alum & mag hydroxide-simeth (MAALOX/MYLANTA) 200-200-20 MG/5ML suspension 30 mL  30 mL Oral Q4H PRN Tex Drilling, NP       cloNIDine  (CATAPRES ) tablet 0.1 mg  0.1 mg Oral BID PRN Tex Drilling, NP       divalproex  (DEPAKOTE ) DR tablet 750 mg  750 mg Oral Q12H Averee Harb, MD   750 mg at 11/09/24 0830   losartan  (COZAAR ) tablet 25 mg  25 mg Oral Daily Nkwenti, Doris, NP   25 mg at 11/09/24 0830   magnesium   hydroxide (MILK OF MAGNESIA) suspension 30 mL  30 mL Oral Daily PRN Tex Drilling, NP       methimazole  (TAPAZOLE ) tablet 10 mg  10 mg Oral Daily Nkwenti, Doris, NP   10 mg at 11/09/24 9166   OLANZapine  (ZYPREXA ) injection 5 mg  5 mg Intramuscular TID PRN Tex Drilling, NP       OLANZapine  (ZYPREXA ) injection 5 mg  5 mg Intramuscular TID PRN Tex Drilling, NP       OLANZapine  (ZYPREXA ) tablet 20 mg  20 mg Oral QHS Jabri Blancett, MD   20 mg at 11/08/24 2129   OLANZapine  zydis (ZYPREXA ) disintegrating tablet 5 mg  5 mg Oral TID PRN Tex Drilling, NP       OLANZapine  zydis (ZYPREXA ) disintegrating tablet 5 mg  5 mg Oral TID PRN Tex Drilling, NP       pantoprazole  (PROTONIX ) EC tablet 40 mg  40 mg Oral Daily Nkwenti, Doris, NP   40 mg at 11/09/24 0831   traZODone  (DESYREL ) tablet 50 mg  50 mg Oral QHS PRN Tex Drilling, NP   50 mg at 11/08/24 2130    Lab Results:  No results found for this or any previous visit (from the past 48 hours).     Blood Alcohol level:  Lab Results  Component Value Date   [facility] <15 10/14/2024   ETH <10 07/02/2022    Metabolic Disorder Labs: Lab Results  Component Value Date   HGBA1C 5.5 10/14/2024   MPG 111.15 10/14/2024   MPG 137 01/06/2022   Lab Results  Component Value Date   PROLACTIN 5.8 10/14/2024   Lab Results  Component Value Date   CHOL 167 10/14/2024   TRIG 68 10/14/2024   HDL 60 10/14/2024   CHOLHDL 2.8 10/14/2024   VLDL 14 10/14/2024   LDLCALC 93 10/14/2024   LDLCALC 73 03/13/2024    Physical Findings: AIMS:  , ,  ,  ,    CIWA:    COWS:      Psychiatric Specialty Exam:  Presentation  General Appearance:  Appropriate for Environment  Eye Contact: Fleeting  Speech: Normal Rate  Speech Volume: Decreased    Mood and Affect  Mood:fine  Affect: Flat   Thought Process  Thought Processes: impoverished  Orientation:Partial  Thought Content:Illogical; Paranoid Ideation  Hallucinations: Denies  Ideas  of Reference:Delusions; Paranoia  Suicidal Thoughts: Denies  Homicidal Thoughts: Denies   Sensorium  Memory: Immediate Fair; Recent Fair  Judgment: Impaired  Insight: Shallow   Executive Functions  Concentration: Fair  Attention Span: Fair  Recall: Poor  Fund of Knowledge: Fair  Language: Fair   Psychomotor Activity  Psychomotor Activity: No data recorded  Musculoskeletal: Strength & Muscle Tone: within normal limits Gait & Station: normal Assets  Assets: Manufacturing Systems Engineer; Desire for Improvement; Social Support    Physical Exam: Physical Exam Vitals and nursing note reviewed.    ROS Blood pressure 119/63, pulse (!) 45, temperature 98.3 F (36.8 C), resp. rate 18, height 5' 8 (1.727 m), weight 48.3 kg, SpO2 100%. Body mass index is 16.19 kg/m.  Diagnosis: Principal Problem:   Schizophrenia, paranoid (HCC)    Treatment Plan Summary:  Safety and Monitoring:             -- Involuntary admission to inpatient psychiatric unit for safety, stabilization and treatment             -- Daily contact with patient to assess and evaluate symptoms and progress in treatment             -- Patient's case to be discussed in multi-disciplinary team meeting             -- Observation Level: q15 minute checks             -- Vital signs:  q12 hours             -- Precautions: suicide, elopement, and assault   2. Psychiatric Diagnoses and Treatment:            Depakote   750 mg twice daily-Will check Depakote  level Zyprexa  to 15 mg nightly     -- The risks/benefits/side-effects/alternatives  to this medication were discussed in detail with the patient and time was given for questions. The patient consents to medication trial.                -- Metabolic profile and EKG monitoring obtained while on an atypical antipsychotic (BMI: Lipid Panel: HbgA1c: QTc:)              -- Encouraged patient to participate in unit milieu and in scheduled group therapies  4.  Discharge Planning:   -- Social work and case management to assist with discharge planning and identification of hospital follow-up needs prior to discharge  -- Estimated LOS: 3-4 days  Kalub Morillo, MD 11/09/2024, 11:58 AM

## 2024-11-09 NOTE — Progress Notes (Signed)
 Behavior:  Pleasant and cooperative.     Psych assessment:  Denies SI/HI and AVH.    Interaction / Group attendance:  Present in the milieu.  Minimal interaction with peers and staff.  Attends groups.   Medication/ PRNs: Compliant.  PRN medication for pain given as ordered.   Pain: 7/10 in shoulder  15 min checks in place for safety.

## 2024-11-09 NOTE — Progress Notes (Signed)
   11/09/24 2121  Psych Admission Type (Psych Patients Only)  Admission Status Involuntary  Psychosocial Assessment  Patient Complaints None  Eye Contact Fair  Facial Expression Flat  Affect Appropriate to circumstance  Speech Logical/coherent;Soft  Interaction Minimal  Motor Activity Slow  Appearance/Hygiene Unremarkable  Behavior Characteristics Cooperative;Appropriate to situation  Mood Pleasant  Thought Process  Coherency WDL  Content WDL  Delusions None reported or observed  Perception WDL  Hallucination None reported or observed  Judgment Impaired  Confusion Mild  Danger to Self  Current suicidal ideation? Denies  Agreement Not to Harm Self Yes  Description of Agreement verbal  Danger to Others  Danger to Others None reported or observed

## 2024-11-09 NOTE — BH IP Treatment Plan (Signed)
 Interdisciplinary Treatment and Diagnostic Plan Update  11/09/2024 Time of Session: 3:00 PM  Shari [name] MRN: [id number]  Principal Diagnosis: Schizophrenia, paranoid (HCC)  Secondary Diagnoses: Principal Problem:   Schizophrenia, paranoid (HCC)   Current Medications:  Current Facility-Administered Medications  Medication Dose Route Frequency Provider Last Rate Last Admin   acetaminophen  (TYLENOL ) tablet 650 mg  650 mg Oral Q6H PRN Tex Drilling, NP   650 mg at 11/09/24 0838   alum & mag hydroxide-simeth (MAALOX/MYLANTA) 200-200-20 MG/5ML suspension 30 mL  30 mL Oral Q4H PRN Tex Drilling, NP       cloNIDine  (CATAPRES ) tablet 0.1 mg  0.1 mg Oral BID PRN Tex Drilling, NP       divalproex  (DEPAKOTE ) DR tablet 750 mg  750 mg Oral Q12H Jadapalle, Sree, MD   750 mg at 11/09/24 0830   losartan  (COZAAR ) tablet 25 mg  25 mg Oral Daily Nkwenti, Doris, NP   25 mg at 11/09/24 0830   magnesium  hydroxide (MILK OF MAGNESIA) suspension 30 mL  30 mL Oral Daily PRN Tex Drilling, NP       methimazole  (TAPAZOLE ) tablet 10 mg  10 mg Oral Daily Nkwenti, Doris, NP   10 mg at 11/09/24 9166   OLANZapine  (ZYPREXA ) injection 5 mg  5 mg Intramuscular TID PRN Tex Drilling, NP       OLANZapine  (ZYPREXA ) injection 5 mg  5 mg Intramuscular TID PRN Tex Drilling, NP       OLANZapine  (ZYPREXA ) tablet 20 mg  20 mg Oral QHS Jadapalle, Sree, MD   20 mg at 11/08/24 2129   OLANZapine  zydis (ZYPREXA ) disintegrating tablet 5 mg  5 mg Oral TID PRN Tex Drilling, NP       OLANZapine  zydis (ZYPREXA ) disintegrating tablet 5 mg  5 mg Oral TID PRN Tex Drilling, NP       pantoprazole  (PROTONIX ) EC tablet 40 mg  40 mg Oral Daily Nkwenti, Doris, NP   40 mg at 11/09/24 0831   traZODone  (DESYREL ) tablet 50 mg  50 mg Oral QHS PRN Tex Drilling, NP   50 mg at 11/08/24 2130   PTA Medications: Medications Prior to Admission  Medication Sig Dispense Refill Last Dose/Taking   Ascorbic Acid (VITAMIN C PO) Take 1 tablet by  mouth daily. (Patient not taking: Reported on 10/14/2024)      divalproex  (DEPAKOTE ) 500 MG DR tablet Take 1 tablet (500 mg total) by mouth at bedtime. (Patient not taking: Reported on 10/14/2024) 30 tablet 1    losartan  (COZAAR ) 25 MG tablet Take 1 tablet (25 mg total) by mouth daily. (Patient not taking: Reported on 10/14/2024) 90 tablet 1    methimazole  (TAPAZOLE ) 10 MG tablet Take 1 tablet (10 mg total) by mouth daily. (Patient not taking: Reported on 10/14/2024) 90 tablet 1    OLANZapine  (ZYPREXA ) 10 MG tablet Take 1 tablet (10 mg total) by mouth at bedtime. (Patient not taking: Reported on 10/14/2024) 30 tablet 1    pantoprazole  (PROTONIX ) 40 MG tablet Take 1 tablet (40 mg total) by mouth daily. (Patient not taking: Reported on 10/14/2024) 90 tablet 0    propranolol  (INDERAL ) 10 MG tablet Take 1 tablet (10 mg total) by mouth 2 (two) times daily. (Patient not taking: Reported on 10/14/2024) 180 tablet 1    VITAMIN D PO Take 1 tablet by mouth daily. (Patient not taking: Reported on 10/14/2024)      VITAMIN E PO Take 1 tablet by mouth daily. (Patient not taking: Reported on 10/14/2024)  Patient Stressors: Medication change or noncompliance    Patient Strengths: Communication skills   Treatment Modalities: Medication Management, Group therapy, Case management,  1 to 1 session with clinician, Psychoeducation, Recreational therapy.   Physician Treatment Plan for Primary Diagnosis: Schizophrenia, paranoid (HCC) Long Term Goal(s): Improvement in symptoms so as ready for discharge   Short Term Goals: Ability to identify changes in lifestyle to reduce recurrence of condition will improve Ability to verbalize feelings will improve Ability to disclose and discuss suicidal ideas Ability to demonstrate self-control will improve Ability to identify and develop effective coping behaviors will improve Ability to maintain clinical measurements within normal limits will improve  Medication  Management: Evaluate patient's response, side effects, and tolerance of medication regimen.  Therapeutic Interventions: 1 to 1 sessions, Unit Group sessions and Medication administration.  Evaluation of Outcomes: Progressing  Physician Treatment Plan for Secondary Diagnosis: Principal Problem:   Schizophrenia, paranoid (HCC)  Long Term Goal(s): Improvement in symptoms so as ready for discharge   Short Term Goals: Ability to identify changes in lifestyle to reduce recurrence of condition will improve Ability to verbalize feelings will improve Ability to disclose and discuss suicidal ideas Ability to demonstrate self-control will improve Ability to identify and develop effective coping behaviors will improve Ability to maintain clinical measurements within normal limits will improve     Medication Management: Evaluate patient's response, side effects, and tolerance of medication regimen.  Therapeutic Interventions: 1 to 1 sessions, Unit Group sessions and Medication administration.  Evaluation of Outcomes: Progressing   RN Treatment Plan for Primary Diagnosis: Schizophrenia, paranoid (HCC) Long Term Goal(s): Knowledge of disease and therapeutic regimen to maintain health will improve  Short Term Goals: Ability to remain free from injury will improve, Ability to verbalize frustration and anger appropriately will improve, Ability to demonstrate self-control, Ability to participate in decision making will improve, Ability to verbalize feelings will improve, Ability to disclose and discuss suicidal ideas, Ability to identify and develop effective coping behaviors will improve, and Compliance with prescribed medications will improve  Medication Management: RN will administer medications as ordered by provider, will assess and evaluate patient's response and provide education to patient for prescribed medication. RN will report any adverse and/or side effects to prescribing  provider.  Therapeutic Interventions: 1 on 1 counseling sessions, Psychoeducation, Medication administration, Evaluate responses to treatment, Monitor vital signs and CBGs as ordered, Perform/monitor CIWA, COWS, AIMS and Fall Risk screenings as ordered, Perform wound care treatments as ordered.  Evaluation of Outcomes: Progressing   LCSW Treatment Plan for Primary Diagnosis: Schizophrenia, paranoid (HCC) Long Term Goal(s): Safe transition to appropriate next level of care at discharge, Engage patient in therapeutic group addressing interpersonal concerns.  Short Term Goals: Engage patient in aftercare planning with referrals and resources, Increase social support, Increase ability to appropriately verbalize feelings, Increase emotional regulation, Facilitate acceptance of mental health diagnosis and concerns, Facilitate patient progression through stages of change regarding substance use diagnoses and concerns, Identify triggers associated with mental health/substance abuse issues, and Increase skills for wellness and recovery  Therapeutic Interventions: Assess for all discharge needs, 1 to 1 time with Social worker, Explore available resources and support systems, Assess for adequacy in community support network, Educate family and significant other(s) on suicide prevention, Complete Psychosocial Assessment, Interpersonal group therapy.  Evaluation of Outcomes: Progressing   Progress in Treatment: Attending groups: Yes. and No.  Participating in groups: Yes. and No.  Taking medication as prescribed: Yes.  Toleration medication: Yes.  Family/Significant other contact  made: No, will contact:  if given permission.  Patient understands diagnosis: No.  Discussing patient identified problems/goals with staff: No.   Denies suicidal/homicidal ideation: Yes.  Issues/concerns per patient self-inventory: No. Other: none.    New problem(s) identified: No, Describe:  None identified 10/23/24 Update:  No changes at this time. 10/28/24 Update: No changes at this time. Update 11/03/24: No changes at this time Update 11/09/24: No changes at this time     New Short Term/Long Term Goal(s): elimination of symptoms of psychosis, medication management for mood stabilization; elimination of SI thoughts; development of comprehensive mental wellness plan. 10/23/24 Update: No changes at this time. 10/28/24 Update: No changes at this time. Update 11/03/24: No changes at this time Update 11/09/24: No changes at this time   Patient Goals:  I haven't set any goals for the hospital yet. I don't need psychiatric help 10/23/24 Update: No changes at this time. 10/28/24 Update: No changes at this time. Update 11/03/24: No changes at this time Update 11/09/24: No changes at this time   Discharge Plan or Barriers: CSW will assist with appropriate discharge plan 10/23/24 Update: No changes at this time. 10/28/24 Update: CSW to continue to meet with DSS to engage in safe discharge planning and connect patient with appropriate resources. Update 11/03/24: No changes at this time Update 11/09/24: No changes at this time   Reason for Continuation of Hospitalization: Delusions  Medication stabilization 10/28/24 Update: Delusions  Medication stabilization    Estimated Length of Stay: 1 to 7 days 10/23/24 Update: No changes at this time. 10/28/24 Update: TBD. Update 11/03/24: TBD Update 11/09/24: TBD  Last 3 [place] Suicide Severity Risk Score: Flowsheet Row Admission (Current) from 10/17/2024 in [facility] from 10/14/2024 in [facility] ED to Hosp-Admission (Discharged) from 03/13/2024 in [place] [facility]  C-SSRS RISK CATEGORY No Risk No Risk No Risk    Last PHQ 2/9 Scores:     No data to display          Scribe for Treatment Team: [name], ISRAEL 11/09/2024 10:55 AM

## 2024-11-09 NOTE — Plan of Care (Signed)

## 2024-11-09 NOTE — Group Note (Signed)
 Date:  11/09/2024 Time:  11:12 AM  Group Topic/Focus:  Coping With Mental Health Crisis:   The purpose of this group is to help patients identify strategies for coping with mental health crisis.  Group discusses possible causes of crisis and ways to manage them effectively.    Participation Level:  Active  Participation Quality:  Appropriate and Attentive  Affect:  Appropriate  Cognitive:  Alert and Appropriate  Insight: Appropriate  Engagement in Group:  Limited  Modes of Intervention:  Activity, Discussion, and Socialization  Additional Comments:     Maglione,Leiana Rund E 11/09/2024, 11:12 AM

## 2024-11-09 NOTE — Group Note (Signed)
 Date:  11/09/2024 Time:  3:31 PM  Group Topic/Focus:  Movement Therapy    Participation Level:  Did Not Attend    [name] 11/09/2024, 3:31 PM

## 2024-11-10 LAB — VALPROIC ACID LEVEL: Valproic Acid Lvl: 31 ug/mL — ABNORMAL LOW (ref 50–100)

## 2024-11-10 NOTE — Group Note (Signed)
 Date:  11/10/2024 Time:  5:22 PM  Group Topic/Focus:  Self Care:   The focus of this group is to help patients understand the importance of self-care in order to improve or restore emotional, physical, spiritual, interpersonal, and financial health.    Participation Level:  Active  Participation Quality:  Appropriate  Affect:  Appropriate  Cognitive:  Alert  Insight: Improving  Engagement in Group:  None  Modes of Intervention:  Orientation  Additional Comments:     [name],Shari Previti E 11/10/2024, 5:22 PM

## 2024-11-10 NOTE — Group Note (Signed)
 Date:  11/10/2024 Time:  9:18 PM  Group Topic/Focus:  Wrap-Up Group:   The focus of this group is to help patients review their daily goal of treatment and discuss progress on daily workbooks.    Participation Level:  Active  Participation Quality:  Appropriate  Affect:  Appropriate  Cognitive:  Alert  Insight: Appropriate  Engagement in Group:  Engaged  Modes of Intervention:  Discussion  Additional Comments:    Shari [name] 11/10/2024, 9:18 PM

## 2024-11-10 NOTE — Plan of Care (Signed)
   Problem: Activity: Goal: Interest or engagement in activities will improve Outcome: Progressing   Problem: Coping: Goal: Ability to verbalize frustrations and anger appropriately will improve Outcome: Progressing Goal: Ability to demonstrate self-control will improve Outcome: Progressing

## 2024-11-10 NOTE — Progress Notes (Signed)
 [facility] MD Progress Note  11/10/2024 12:11 PM Shari [name]  MRN:  [id number] Patient is a 68 year old female with a history of Schizoaffective Disorder, Bipolar Type who presents via [employer] under IVC, initiated by CM, Falencio with re-entry program, to [facility] for assessment. Per IVC, [name] has been diagnosed with Schizophrenia and is refusing to take medication. [name] isn't sleeping. Residents report her screaming and yelling all night long.  [name] responding to internal stimuli.  [name] verbally aggressive.  The respondent has locked all of the residents inside of the transitional housing home.  They could not leave the home which prompted the IVC.  Residents did not have access to food, the restroom or anything as a result of being locked in.  [name] is currently under Adult protective services care. Patient is admitted to [facility] unit with Q15 min safety monitoring. Multidisciplinary team approach is offered. Medication management; group/milieu therapy is offered.   Subjective:  Chart reviewed, case discussed in multidisciplinary meeting, patient seen during rounds.  Patient is noted to be resting in bed.  She offers no complaints.  Per nursing patient is attending groups.  She has fair appetite and sleep.  She denies SI/HI/plan and denies auditory/visual hallucinations.  Patient remains discharge focused and wants to know her transition of care.  Patient is informed about APS working on her transition back to community.  Past Psychiatric History: see h&P Family History:  Family History  Problem Relation Age of Onset   Diabetes Mother    CAD Mother    Lung cancer Father    Social History:  Social History   Substance and Sexual Activity  Alcohol Use Yes   Comment: beer occasionally     Social History   Substance and Sexual Activity  Drug Use No    Social History   Socioeconomic History   Marital status: Widowed    Spouse name: Not on file    Number of children: Not on file   Years of education: Not on file   Highest education level: Not on file  Occupational History   Not on file  Tobacco Use   Smoking status: Every Day    Current packs/day: 0.50    Average packs/day: 0.5 packs/day for 40.0 years (20.0 ttl pk-yrs)    Types: Cigarettes   Smokeless tobacco: Never  Vaping Use   Vaping status: Never Used  Substance and Sexual Activity   Alcohol use: Yes    Comment: beer occasionally   Drug use: No   Sexual activity: Not Currently    Birth control/protection: Post-menopausal  Other Topics Concern   Not on file  Social History Narrative   Not on file   Social Drivers of Health   Financial Resource Strain: Not on file  Food Insecurity: No Food Insecurity (10/17/2024)   Hunger Vital Sign    Worried About Running Out of Food in the Last Year: Never true    Ran Out of Food in the Last Year: Never true  Transportation Needs: No Transportation Needs (10/17/2024)   PRAPARE - Administrator, Civil Service (Medical): No    Lack of Transportation (Non-Medical): No  Recent Concern: Transportation Needs - Unmet Transportation Needs (10/14/2024)   PRAPARE - Administrator, Civil Service (Medical): Yes    Lack of Transportation (Non-Medical): Yes  Physical Activity: Not on file  Stress: Not on file  Social Connections: Unknown (10/17/2024)   Social Connection and  Isolation Panel    Frequency of Communication with Friends and Family: Patient unable to answer    Frequency of Social Gatherings with Friends and Family: Patient unable to answer    Attends Religious Services: Patient unable to answer    Active Member of Clubs or Organizations: Patient declined    Attends Banker Meetings: Patient unable to answer    Marital Status: Widowed   Past Medical History:  Past Medical History:  Diagnosis Date   Eye globe prosthesis    GERD (gastroesophageal reflux disease)    History of blood  transfusion 1973   related to abscess burst in my stomach   Hyperlipemia    Hyperlipidemia    Hypertension    Osteoarthritis of back    Lowerback    SVD (spontaneous vaginal delivery)    x 1, baby died at 2 wks of age   Type II diabetes mellitus (HCC)     Past Surgical History:  Procedure Laterality Date   APPENDECTOMY     COLONOSCOPY     DILATATION & CURETTAGE/HYSTEROSCOPY WITH MYOSURE N/A 02/25/2015   Procedure: DILATATION & CURETTAGE/HYSTEROSCOPY WITH MYOSURE;  Surgeon: Truman Corona, MD;  Location: [facility];  Service: Gynecology;  Laterality: N/A;   DILATION AND CURETTAGE OF UTERUS     ENUCLEATION Right 03/16/2017   ENUCLEATION Right 03/16/2017   Procedure: ENUCLEATION RIGHT EYE;  Surgeon: Loyd Kathryne Palm, MD;  Location: [facility] OR;  Service: Ophthalmology;  Laterality: Right;   EYE SURGERY     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6, 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   EYE SURGERY Right ~ 1974   S/P initial eye injury; scissors stuck in my eye   LAPAROSCOPIC CHOLECYSTECTOMY     SHOULDER ARTHROSCOPY WITH ROTATOR CUFF REPAIR Left    wire stitches per patient   SHOULDER ARTHROSCOPY WITH ROTATOR CUFF REPAIR Right     Current Medications: Current Facility-Administered Medications  Medication Dose Route Frequency Provider Last Rate Last Admin   acetaminophen  (TYLENOL ) tablet 650 mg  650 mg Oral Q6H PRN Tex Drilling, NP   650 mg at 11/09/24 2101   alum & mag hydroxide-simeth (MAALOX/MYLANTA) 200-200-20 MG/5ML suspension 30 mL  30 mL Oral Q4H PRN Nkwenti, Drilling, NP       cloNIDine  (CATAPRES ) tablet 0.1 mg  0.1 mg Oral BID PRN Tex Drilling, NP       divalproex  (DEPAKOTE ) DR tablet 750 mg  750 mg Oral Q12H Jameyah Fennewald, MD   750 mg at 11/10/24 9144   losartan  (COZAAR ) tablet 25 mg  25 mg Oral Daily Nkwenti, Doris, NP   25 mg at 11/10/24 9145   magnesium  hydroxide (MILK OF MAGNESIA) suspension 30 mL  30 mL Oral Daily PRN Tex Drilling, NP       methimazole  (TAPAZOLE ) tablet 10 mg  10 mg Oral Daily Nkwenti,  Doris, NP   10 mg at 11/10/24 0854   OLANZapine  (ZYPREXA ) injection 5 mg  5 mg Intramuscular TID PRN Tex Drilling, NP       OLANZapine  (ZYPREXA ) injection 5 mg  5 mg Intramuscular TID PRN Tex Drilling, NP       OLANZapine  (ZYPREXA ) tablet 20 mg  20 mg Oral QHS Arial Galligan, MD   20 mg at 11/09/24 2101   OLANZapine  zydis (ZYPREXA ) disintegrating tablet 5 mg  5 mg Oral TID PRN Tex Drilling, NP       OLANZapine  zydis (ZYPREXA ) disintegrating tablet 5 mg  5 mg Oral TID PRN Tex Drilling, NP  pantoprazole  (PROTONIX ) EC tablet 40 mg  40 mg Oral Daily Nkwenti, Doris, NP   40 mg at 11/10/24 0856   traZODone  (DESYREL ) tablet 50 mg  50 mg Oral QHS PRN Tex Drilling, NP   50 mg at 11/09/24 2101    Lab Results:  No results found for this or any previous visit (from the past 48 hours).     Blood Alcohol level:  Lab Results  Component Value Date   [facility] <15 10/14/2024   ETH <10 07/02/2022    Metabolic Disorder Labs: Lab Results  Component Value Date   HGBA1C 5.5 10/14/2024   MPG 111.15 10/14/2024   MPG 137 01/06/2022   Lab Results  Component Value Date   PROLACTIN 5.8 10/14/2024   Lab Results  Component Value Date   CHOL 167 10/14/2024   TRIG 68 10/14/2024   HDL 60 10/14/2024   CHOLHDL 2.8 10/14/2024   VLDL 14 10/14/2024   LDLCALC 93 10/14/2024   LDLCALC 73 03/13/2024    Physical Findings: AIMS:  , ,  ,  ,    CIWA:    COWS:      Psychiatric Specialty Exam:  Presentation  General Appearance:  Appropriate for Environment  Eye Contact: Fleeting  Speech: Normal Rate  Speech Volume: Decreased    Mood and Affect  Mood:fine  Affect: Flat   Thought Process  Thought Processes: impoverished  Orientation:Partial  Thought Content:Illogical; Paranoid Ideation  Hallucinations: Denies  Ideas of Reference:Delusions; Paranoia  Suicidal Thoughts: Denies  Homicidal Thoughts: Denies   Sensorium  Memory: Immediate Fair; Recent  Fair  Judgment: Impaired  Insight: Shallow   Executive Functions  Concentration: Fair  Attention Span: Fair  Recall: Poor  Fund of Knowledge: Fair  Language: Fair   Psychomotor Activity  Psychomotor Activity: No data recorded  Musculoskeletal: Strength & Muscle Tone: within normal limits Gait & Station: normal Assets  Assets: Manufacturing Systems Engineer; Desire for Improvement; Social Support    Physical Exam: Physical Exam Vitals and nursing note reviewed.    ROS Blood pressure (!) 144/68, pulse 83, temperature 98 F (36.7 C), resp. rate 16, height 5' 8 (1.727 m), weight 48.3 kg, SpO2 99%. Body mass index is 16.19 kg/m.  Diagnosis: Principal Problem:   Schizophrenia, paranoid (HCC)    Treatment Plan Summary:  Safety and Monitoring:             -- Involuntary admission to inpatient psychiatric unit for safety, stabilization and treatment             -- Daily contact with patient to assess and evaluate symptoms and progress in treatment             -- Patient's case to be discussed in multi-disciplinary team meeting             -- Observation Level: q15 minute checks             -- Vital signs:  q12 hours             -- Precautions: suicide, elopement, and assault   2. Psychiatric Diagnoses and Treatment:            Depakote   750 mg twice daily-Will check Depakote  level Zyprexa  to 15 mg nightly     -- The risks/benefits/side-effects/alternatives to this medication were discussed in detail with the patient and time was given for questions. The patient consents to medication trial.                --  Metabolic profile and EKG monitoring obtained while on an atypical antipsychotic (BMI: Lipid Panel: HbgA1c: QTc:)              -- Encouraged patient to participate in unit milieu and in scheduled group therapies  4. Discharge Planning:   -- Social work and case management to assist with discharge planning and identification of hospital follow-up needs prior  to discharge  -- Estimated LOS: 3-4 days  Allyn Foil, MD 11/10/2024, 12:11 PM

## 2024-11-10 NOTE — Progress Notes (Signed)
 Behavior:  Pleasant and cooperative.     Psych assessment: Flat affect.  Denies SI/HI and AVH.  Interaction / Group attendance:  Present in milieu.  Minimal interaction with peers and staff.  Medication/ PRNs: Compliant.  Pain: Denies  15 min checks in place for safety.    Pt returned to her room after breakfast.  Pt stated she did not sleep well last night.

## 2024-11-10 NOTE — Group Note (Signed)
 LCSW Group Therapy Note  Group Date: 11/10/2024 Start Time: 1045 End Time: 1130   Type of Therapy and Topic:  Group Therapy: Using I Statements  Participation Level:  Active  Description of Group:  Patients were asked to provide details of some interpersonal conflicts they have experienced. Patients were then educated about "I" statements, communication which focuses on feelings or views of the speaker rather than what the other person is doing. The group members were asked to reflect on past conflicts and to provide specific examples for utilizing "I" statements.  Therapeutic Goals:  Patients will verbalize understanding of ineffective communication and effective communication. Patients will be able to empathize with whom they are having conflict. Patients will practice effective communication in the form of "I" statements.    Summary of Patient Progress:  The patient was present/active throughout the session and proved open to feedback from CSW and peers. Patient demonstrated insight into the subject matter, was respectful of peers, and was present throughout the entire session.  Therapeutic Modalities:   Cognitive Behavioral Therapy Solution-Focused Therapy    Rexene LELON Chesley KEN 11/10/2024  12:43 PM

## 2024-11-10 NOTE — Group Note (Signed)
 Date:  11/10/2024 Time:  10:42 AM  Group Topic/Focus:  Goals/Emotion Regulation Group:   The focus of this group is to help patients establish daily goals to achieve during treatment and discuss how the patient can incorporate goal setting into their daily lives to aide in recovery. Pt also learned what emotion regulation was and shared their thoughts and opinion on the topic.   Participation Level:  None  Additional Comments: Pt came into group when it was over.   Nisa Decaire T Zulema 11/10/2024, 10:42 AM

## 2024-11-11 LAB — CBC WITH DIFFERENTIAL/PLATELET
Abs Immature Granulocytes: 0.02 K/uL (ref 0.00–0.07)
Basophils Absolute: 0 K/uL (ref 0.0–0.1)
Basophils Relative: 1 %
Eosinophils Absolute: 0.1 K/uL (ref 0.0–0.5)
Eosinophils Relative: 1 %
HCT: 33.5 % — ABNORMAL LOW (ref 36.0–46.0)
Hemoglobin: 10.9 g/dL — ABNORMAL LOW (ref 12.0–15.0)
Immature Granulocytes: 0 %
Lymphocytes Relative: 46 %
Lymphs Abs: 2.8 K/uL (ref 0.7–4.0)
MCH: 26.4 pg (ref 26.0–34.0)
MCHC: 32.5 g/dL (ref 30.0–36.0)
MCV: 81.1 fL (ref 80.0–100.0)
Monocytes Absolute: 0.4 K/uL (ref 0.1–1.0)
Monocytes Relative: 7 %
Neutro Abs: 2.8 K/uL (ref 1.7–7.7)
Neutrophils Relative %: 45 %
Platelets: 219 K/uL (ref 150–400)
RBC: 4.13 MIL/uL (ref 3.87–5.11)
RDW: 14.7 % (ref 11.5–15.5)
WBC: 6.2 K/uL (ref 4.0–10.5)
nRBC: 0 % (ref 0.0–0.2)

## 2024-11-11 NOTE — Plan of Care (Signed)
   Problem: Education: Goal: Knowledge of [facility] Education information/materials will improve Outcome: Progressing Goal: Emotional status will improve Outcome: Progressing Goal: Mental status will improve Outcome: Progressing Goal: Verbalization of understanding the information provided will improve Outcome: Progressing   Problem: Activity: Goal: Interest or engagement in activities will improve Outcome: Progressing

## 2024-11-11 NOTE — Progress Notes (Signed)
   11/10/24 2109  Psych Admission Type (Psych Patients Only)  Admission Status Involuntary  Psychosocial Assessment  Patient Complaints None  Eye Contact Fair  Facial Expression Other (Comment) (wnl)  Affect Appropriate to circumstance  Speech Logical/coherent  Interaction Minimal  Motor Activity Slow  Appearance/Hygiene Unremarkable  Behavior Characteristics Appropriate to situation  Mood Pleasant  Thought Process  Coherency WDL  Content WDL  Delusions None reported or observed  Perception WDL  Hallucination None reported or observed  Judgment Impaired  Confusion Mild  Danger to Self  Current suicidal ideation? Denies  Agreement Not to Harm Self Yes  Description of Agreement Verbal  Danger to Others  Danger to Others None reported or observed

## 2024-11-11 NOTE — Group Note (Signed)
 Date:  11/11/2024 Time:  4:42 PM  Group Topic/Focus:  Making Healthy Choices:   The focus of this group is to help patients identify negative/unhealthy choices they were using prior to admission and identify positive/healthier coping strategies to replace them upon discharge.    Participation Level:  Active  Participation Quality:  Appropriate  Affect:  Appropriate  Cognitive:  Appropriate  Insight: Appropriate  Engagement in Group:  Engaged  Modes of Intervention:  Discussion   [name] 11/11/2024, 4:42 PM

## 2024-11-11 NOTE — Plan of Care (Signed)
  Problem: Education: Goal: Knowledge of [facility] Education information/materials will improve Outcome: Progressing Goal: Emotional status will improve Outcome: Progressing Goal: Mental status will improve Outcome: Progressing Goal: Verbalization of understanding the information provided will improve Outcome: Progressing   Problem: Activity: Goal: Interest or engagement in activities will improve Outcome: Progressing Goal: Sleeping patterns will improve Outcome: Progressing   Problem: Safety: Goal: Periods of time without injury will increase Outcome: Progressing

## 2024-11-11 NOTE — Progress Notes (Signed)
 [facility] MD Progress Note  11/11/2024 8:52 PM Shari [name]  MRN:  [id number] Patient is a 68 year old female with a history of Schizoaffective Disorder, Bipolar Type who presents via [facility] under IVC, initiated by CM, Falencio with re-entry program, to [facility] for assessment. Per IVC, [name] has been diagnosed with Schizophrenia and is refusing to take medication. [name] isn't sleeping. Residents report her screaming and yelling all night long.  [name] responding to internal stimuli.  [name] verbally aggressive.  The respondent has locked all of the residents inside of the transitional housing home.  They could not leave the home which prompted the IVC.  Residents did not have access to food, the restroom or anything as a result of being locked in.  [name] is currently under Adult protective services care. Patient is admitted to [facility] unit with Q15 min safety monitoring. Multidisciplinary team approach is offered. Medication management; group/milieu therapy is offered.   Subjective:  Chart reviewed, case discussed in multidisciplinary meeting, patient seen during rounds.   Patient is noted to be resting in bed.  She offers no complaints.  She reports she is looking forward to go back to her house.  She denies SI/HI/plan and denies hallucinations.  Per nursing patient is taking her medications very well.  She did inform the provider that she is feeling tired and sleepy during the daytime.  Patient is updated that APS is working on her placement. Past Psychiatric History: see h&P Family History:  Family History  Problem Relation Age of Onset   Diabetes Mother    CAD Mother    Lung cancer Father    Social History:  Social History   Substance and Sexual Activity  Alcohol Use Yes   Comment: beer occasionally     Social History   Substance and Sexual Activity  Drug Use No    Social History   Socioeconomic History   Marital status: Widowed    Spouse name:  Not on file   Number of children: Not on file   Years of education: Not on file   Highest education level: Not on file  Occupational History   Not on file  Tobacco Use   Smoking status: Every Day    Current packs/day: 0.50    Average packs/day: 0.5 packs/day for 40.0 years (20.0 ttl pk-yrs)    Types: Cigarettes   Smokeless tobacco: Never  Vaping Use   Vaping status: Never Used  Substance and Sexual Activity   Alcohol use: Yes    Comment: beer occasionally   Drug use: No   Sexual activity: Not Currently    Birth control/protection: Post-menopausal  Other Topics Concern   Not on file  Social History Narrative   Not on file   Social Drivers of Health   Financial Resource Strain: Not on file  Food Insecurity: No Food Insecurity (10/17/2024)   Hunger Vital Sign    Worried About Running Out of Food in the Last Year: Never true    Ran Out of Food in the Last Year: Never true  Transportation Needs: No Transportation Needs (10/17/2024)   PRAPARE - Administrator, Civil Service (Medical): No    Lack of Transportation (Non-Medical): No  Recent Concern: Transportation Needs - Unmet Transportation Needs (10/14/2024)   PRAPARE - Administrator, Civil Service (Medical): Yes    Lack of Transportation (Non-Medical): Yes  Physical Activity: Not on file  Stress: Not on file  Social Connections: Unknown (10/17/2024)   Social Connection and Isolation Panel    Frequency of Communication with Friends and Family: Patient unable to answer    Frequency of Social Gatherings with Friends and Family: Patient unable to answer    Attends Religious Services: Patient unable to answer    Active Member of Clubs or Organizations: Patient declined    Attends Banker Meetings: Patient unable to answer    Marital Status: Widowed   Past Medical History:  Past Medical History:  Diagnosis Date   Eye globe prosthesis    GERD (gastroesophageal reflux disease)    History  of blood transfusion 1973   related to abscess burst in my stomach   Hyperlipemia    Hyperlipidemia    Hypertension    Osteoarthritis of back    Lowerback    SVD (spontaneous vaginal delivery)    x 1, baby died at 2 wks of age   Type II diabetes mellitus (HCC)     Past Surgical History:  Procedure Laterality Date   APPENDECTOMY     COLONOSCOPY     DILATATION & CURETTAGE/HYSTEROSCOPY WITH MYOSURE N/A 02/25/2015   Procedure: DILATATION & CURETTAGE/HYSTEROSCOPY WITH MYOSURE;  Surgeon: Truman Corona, MD;  Location: [facility];  Service: Gynecology;  Laterality: N/A;   DILATION AND CURETTAGE OF UTERUS     ENUCLEATION Right 03/16/2017   ENUCLEATION Right 03/16/2017   Procedure: ENUCLEATION RIGHT EYE;  Surgeon: Loyd Kathryne Palm, MD;  Location: [facility] OR;  Service: Ophthalmology;  Laterality: Right;   EYE SURGERY     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6, 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   EYE SURGERY Right ~ 1974   S/P initial eye injury; scissors stuck in my eye   LAPAROSCOPIC CHOLECYSTECTOMY     SHOULDER ARTHROSCOPY WITH ROTATOR CUFF REPAIR Left    wire stitches per patient   SHOULDER ARTHROSCOPY WITH ROTATOR CUFF REPAIR Right     Current Medications: Current Facility-Administered Medications  Medication Dose Route Frequency Provider Last Rate Last Admin   acetaminophen  (TYLENOL ) tablet 650 mg  650 mg Oral Q6H PRN Tex Drilling, NP   650 mg at 11/10/24 2116   alum & mag hydroxide-simeth (MAALOX/MYLANTA) 200-200-20 MG/5ML suspension 30 mL  30 mL Oral Q4H PRN Tex Drilling, NP       cloNIDine  (CATAPRES ) tablet 0.1 mg  0.1 mg Oral BID PRN Tex Drilling, NP       divalproex  (DEPAKOTE ) DR tablet 750 mg  750 mg Oral Q12H Lester Crickenberger, MD   750 mg at 11/11/24 0950   losartan  (COZAAR ) tablet 25 mg  25 mg Oral Daily Nkwenti, Doris, NP   25 mg at 11/11/24 0950   magnesium  hydroxide (MILK OF MAGNESIA) suspension 30 mL  30 mL Oral Daily PRN Tex Drilling, NP       methimazole  (TAPAZOLE ) tablet 10 mg  10 mg Oral Daily  Nkwenti, Doris, NP   10 mg at 11/11/24 0950   OLANZapine  (ZYPREXA ) injection 5 mg  5 mg Intramuscular TID PRN Tex Drilling, NP       OLANZapine  (ZYPREXA ) injection 5 mg  5 mg Intramuscular TID PRN Tex Drilling, NP       OLANZapine  (ZYPREXA ) tablet 20 mg  20 mg Oral QHS Aylin Rhoads, MD   20 mg at 11/10/24 2109   OLANZapine  zydis (ZYPREXA ) disintegrating tablet 5 mg  5 mg Oral TID PRN Tex Drilling, NP       OLANZapine  zydis (ZYPREXA ) disintegrating tablet 5 mg  5 mg Oral TID PRN Tex Drilling, NP       pantoprazole  (PROTONIX ) EC tablet 40 mg  40 mg Oral Daily Nkwenti, Doris, NP   40 mg at 11/11/24 0950   traZODone  (DESYREL ) tablet 50 mg  50 mg Oral QHS PRN Tex Drilling, NP   50 mg at 11/10/24 2109    Lab Results:  Results for orders placed or performed during the hospital encounter of 10/17/24 (from the past 48 hours)  Valproic acid  level     Status: Abnormal   Collection Time: 11/10/24 10:18 AM  Result Value Ref Range   Valproic Acid  Lvl 31 (L) 50 - 100 ug/mL    Comment: Performed at [facility], [address] [place], [place] [address]       Blood Alcohol level:  Lab Results  Component Value Date   [facility] <15 10/14/2024   ETH <10 07/02/2022    Metabolic Disorder Labs: Lab Results  Component Value Date   HGBA1C 5.5 10/14/2024   MPG 111.15 10/14/2024   MPG 137 01/06/2022   Lab Results  Component Value Date   PROLACTIN 5.8 10/14/2024   Lab Results  Component Value Date   CHOL 167 10/14/2024   TRIG 68 10/14/2024   HDL 60 10/14/2024   CHOLHDL 2.8 10/14/2024   VLDL 14 10/14/2024   LDLCALC 93 10/14/2024   LDLCALC 73 03/13/2024    Physical Findings: AIMS:  , ,  ,  ,    CIWA:    COWS:      Psychiatric Specialty Exam:  Presentation  General Appearance:  Appropriate for Environment  Eye Contact: Fleeting  Speech: Normal Rate  Speech Volume: Decreased    Mood and Affect  Mood:fine  Affect: Flat   Thought Process  Thought  Processes: impoverished  Orientation:Partial  Thought Content: Chronic delusions Hallucinations: Denies  Ideas of Reference:Delusions; Paranoia  Suicidal Thoughts: Denies  Homicidal Thoughts: Denies   Sensorium  Memory: Immediate Fair; Recent Fair  Judgment: Impaired  Insight: Shallow   Executive Functions  Concentration: Fair  Attention Span: Fair  Recall: Poor  Fund of Knowledge: Fair  Language: Fair   Psychomotor Activity  Psychomotor Activity: No data recorded  Musculoskeletal: Strength & Muscle Tone: within normal limits Gait & Station: normal Assets  Assets: Manufacturing Systems Engineer; Desire for Improvement; Social Support    Physical Exam: Physical Exam Vitals and nursing note reviewed.    ROS Blood pressure (!) 121/53, pulse 84, temperature 98.8 F (37.1 C), resp. rate 14, height 5' 8 (1.727 m), weight 48.3 kg, SpO2 100%. Body mass index is 16.19 kg/m.  Diagnosis: Principal Problem:   Schizophrenia, paranoid (HCC)    Treatment Plan Summary:  Safety and Monitoring:             -- Involuntary admission to inpatient psychiatric unit for safety, stabilization and treatment             -- Daily contact with patient to assess and evaluate symptoms and progress in treatment             -- Patient's case to be discussed in multi-disciplinary team meeting             -- Observation Level: q15 minute checks             -- Vital signs:  q12 hours             -- Precautions: suicide, elopement, and assault   2. Psychiatric Diagnoses and Treatment:  Depakote   750 mg twice daily-Will check Depakote  level Zyprexa  to 15 mg nightly BMP   -- The risks/benefits/side-effects/alternatives to this medication were discussed in detail with the patient and time was given for questions. The patient consents to medication trial.                -- Metabolic profile and EKG monitoring obtained while on an atypical antipsychotic (BMI: Lipid  Panel: HbgA1c: QTc:)              -- Encouraged patient to participate in unit milieu and in scheduled group therapies  4. Discharge Planning:   -- Social work and case management to assist with discharge planning and identification of hospital follow-up needs prior to discharge  -- Estimated LOS: 3-4 days  Allyn Foil, MD 11/11/2024, 8:52 PM

## 2024-11-11 NOTE — Group Note (Signed)
 Recreation Therapy Group Note   Group Topic:Other  Group Date: 11/11/2024 Start Time: 1415 End Time: 1500 Facilitators: Celestia Jeoffrey BRAVO, LRT, CTRS Location: Dayroom  Activity Description/Intervention: Therapeutic Drumming. Patients with peers and staff were given the opportunity to engage in a leader facilitated [facility] Group Empowerment Drumming Circle with staff from the [employer], in partnership with [employer]. Teaching laboratory technician and trained [place], Norleen Mon leading with LRT observing and documenting intervention and pt response. This evidenced-based practice targets 7 areas of health and wellbeing in the human experience including: stress-reduction, exercise, self-expression, camaraderie/support, nurturing, spirituality, and music-making (leisure).    Goal Area(s) Addresses:  Patient will engage in pro-social way in music group.  Patient will follow directions of drum leader on the first prompt. Patient will demonstrate no behavioral issues during group.  Patient will identify if a reduction in stress level occurs as a result of participation in therapeutic drum circle.   Affect/Mood: N/A   Participation Level: Did not attend    Clinical Observations/Individualized Feedback: Patient did not attend group.   Plan: Continue to engage patient in RT group sessions 2-3x/week.   Jeoffrey BRAVO Celestia, LRT, CTRS 11/11/2024 5:05 PM

## 2024-11-11 NOTE — Progress Notes (Signed)
   11/11/24 1200  Psych Admission Type (Psych Patients Only)  Admission Status Involuntary  Psychosocial Assessment  Patient Complaints None  Eye Contact Fair  Facial Expression Anxious  Affect Appropriate to circumstance  Speech Logical/coherent  Interaction Assertive  Motor Activity Slow  Appearance/Hygiene Unremarkable  Behavior Characteristics Appropriate to situation  Mood Pleasant  Thought Process  Coherency WDL  Content WDL  Delusions None reported or observed  Perception WDL  Hallucination None reported or observed  Judgment Impaired  Confusion Mild  Danger to Self  Current suicidal ideation? Denies  Agreement Not to Harm Self Yes  Description of Agreement verbal  Danger to Others  Danger to Others None reported or observed

## 2024-11-11 NOTE — Group Note (Signed)
 Date:  11/11/2024 Time:  10:50 AM  Group Topic/Focus:  Goals/ Christmas Bingo  Group:   The focus of this group is to help patients establish daily goals to achieve during treatment and discuss how the patient can incorporate goal setting into their daily lives to aide in recovery. Patients also played 2 rounds of bingo and won cytogeneticist.     Participation Level:  Active  Participation Quality:  Appropriate  Affect:  Appropriate  Cognitive:  Appropriate  Insight: Appropriate  Engagement in Group:  Engaged  Modes of Intervention:  Activity and Discussion  Additional Comments:    Shari [name] 11/11/2024, 10:50 AM

## 2024-11-11 NOTE — Group Note (Signed)
 Date:  11/11/2024 Time:  9:40 PM  Group Topic/Focus:  Wrap-Up Group:   The focus of this group is to help patients review their daily goal of treatment and discuss progress on daily workbooks.    Participation Level:  Did Not Attend  Participation Quality:     Affect:     Cognitive:     Insight: None  Engagement in Group:  None  Modes of Intervention:     Additional Comments:    Shari [name] [name] 11/11/2024, 9:40 PM

## 2024-11-12 LAB — VALPROIC ACID LEVEL: Valproic Acid Lvl: 44 ug/mL — ABNORMAL LOW (ref 50–100)

## 2024-11-12 LAB — COMPREHENSIVE METABOLIC PANEL WITH GFR
ALT: 7 U/L (ref 0–44)
AST: 12 U/L — ABNORMAL LOW (ref 15–41)
Albumin: 3.8 g/dL (ref 3.5–5.0)
Alkaline Phosphatase: 151 U/L — ABNORMAL HIGH (ref 38–126)
Anion gap: 9 (ref 5–15)
BUN: 12 mg/dL (ref 8–23)
CO2: 28 mmol/L (ref 22–32)
Calcium: 10 mg/dL (ref 8.9–10.3)
Chloride: 104 mmol/L (ref 98–111)
Creatinine, Ser: 0.63 mg/dL (ref 0.44–1.00)
GFR, Estimated: >60 mL/min (ref >60)
Glucose, Bld: 113 mg/dL — ABNORMAL HIGH (ref 70–99)
Potassium: 4.3 mmol/L (ref 3.5–5.1)
Sodium: 140 mmol/L (ref 135–145)
Total Bilirubin: 0.3 mg/dL (ref 0.0–1.2)
Total Protein: 6.7 g/dL (ref 6.5–8.1)

## 2024-11-12 MED ORDER — DIVALPROEX SODIUM 250 MG PO DR TAB
500.0000 mg | DELAYED_RELEASE_TABLET | Freq: Every morning | ORAL | Status: DC
  Administered 2024-11-13: 500 mg via ORAL
  Filled 2024-11-12: qty 2

## 2024-11-12 MED ORDER — DIVALPROEX SODIUM 250 MG PO DR TAB
1500.0000 mg | DELAYED_RELEASE_TABLET | Freq: Every day | ORAL | Status: AC
  Administered 2024-11-13 – 2025-01-10 (×59): 1500 mg via ORAL
  Filled 2024-11-12 (×52): qty 6

## 2024-11-12 NOTE — Progress Notes (Signed)
 [facility] MD Progress Note  11/12/2024 9:03 PM Shari [name]  MRN:  [id number] Patient is a 68 year old female with a history of Schizoaffective Disorder, Bipolar Type who presents via [facility] under IVC, initiated by CM, Falencio with re-entry program, to [facility] for assessment. Per IVC, [name] has been diagnosed with Schizophrenia and is refusing to take medication. [name] isn't sleeping. Residents report her screaming and yelling all night long.  [name] responding to internal stimuli.  [name] verbally aggressive.  The respondent has locked all of the residents inside of the transitional housing home.  They could not leave the home which prompted the IVC.  Residents did not have access to food, the restroom or anything as a result of being locked in.  [name] is currently under Adult protective services care. Patient is admitted to [facility] unit with Q15 min safety monitoring. Multidisciplinary team approach is offered. Medication management; group/milieu therapy is offered.   Subjective:  Chart reviewed, case discussed in multidisciplinary meeting, patient seen during rounds.   Patient is noted to be resting in bed.  She reports feeling drowsy most of the day.  Provider and patient discussed about switching her Depakote  most of the dosing to the night.  Patient was educated about recent labs to check her BMP, Depakote  levels and then adjust the dose of Depakote .  Per nursing patient has been pleasant and cooperative.  Patient denies SI/HI/plan and denies hallucinations.  Past Psychiatric History: see h&P Family History:  Family History  Problem Relation Age of Onset   Diabetes Mother    CAD Mother    Lung cancer Father    Social History:  Social History   Substance and Sexual Activity  Alcohol Use Yes   Comment: beer occasionally     Social History   Substance and Sexual Activity  Drug Use No    Social History   Socioeconomic History   Marital status:  Widowed    Spouse name: Not on file   Number of children: Not on file   Years of education: Not on file   Highest education level: Not on file  Occupational History   Not on file  Tobacco Use   Smoking status: Every Day    Current packs/day: 0.50    Average packs/day: 0.5 packs/day for 40.0 years (20.0 ttl pk-yrs)    Types: Cigarettes   Smokeless tobacco: Never  Vaping Use   Vaping status: Never Used  Substance and Sexual Activity   Alcohol use: Yes    Comment: beer occasionally   Drug use: No   Sexual activity: Not Currently    Birth control/protection: Post-menopausal  Other Topics Concern   Not on file  Social History Narrative   Not on file   Social Drivers of Health   Financial Resource Strain: Not on file  Food Insecurity: No Food Insecurity (10/17/2024)   Hunger Vital Sign    Worried About Running Out of Food in the Last Year: Never true    Ran Out of Food in the Last Year: Never true  Transportation Needs: No Transportation Needs (10/17/2024)   PRAPARE - Administrator, Civil Service (Medical): No    Lack of Transportation (Non-Medical): No  Recent Concern: Transportation Needs - Unmet Transportation Needs (10/14/2024)   PRAPARE - Administrator, Civil Service (Medical): Yes    Lack of Transportation (Non-Medical): Yes  Physical Activity: Not on file  Stress: Not on file  Social Connections: Unknown (10/17/2024)   Social Connection and Isolation Panel    Frequency of Communication with Friends and Family: Patient unable to answer    Frequency of Social Gatherings with Friends and Family: Patient unable to answer    Attends Religious Services: Patient unable to answer    Active Member of Clubs or Organizations: Patient declined    Attends Banker Meetings: Patient unable to answer    Marital Status: Widowed   Past Medical History:  Past Medical History:  Diagnosis Date   Eye globe prosthesis    GERD (gastroesophageal  reflux disease)    History of blood transfusion 1973   related to abscess burst in my stomach   Hyperlipemia    Hyperlipidemia    Hypertension    Osteoarthritis of back    Lowerback    SVD (spontaneous vaginal delivery)    x 1, baby died at 2 wks of age   Type II diabetes mellitus (HCC)     Past Surgical History:  Procedure Laterality Date   APPENDECTOMY     COLONOSCOPY     DILATATION & CURETTAGE/HYSTEROSCOPY WITH MYOSURE N/A 02/25/2015   Procedure: DILATATION & CURETTAGE/HYSTEROSCOPY WITH MYOSURE;  Surgeon: Truman Corona, MD;  Location: [facility];  Service: Gynecology;  Laterality: N/A;   DILATION AND CURETTAGE OF UTERUS     ENUCLEATION Right 03/16/2017   ENUCLEATION Right 03/16/2017   Procedure: ENUCLEATION RIGHT EYE;  Surgeon: Loyd Kathryne Palm, MD;  Location: [facility] OR;  Service: Ophthalmology;  Laterality: Right;   EYE SURGERY     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6, 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   EYE SURGERY Right ~ 1974   S/P initial eye injury; scissors stuck in my eye   LAPAROSCOPIC CHOLECYSTECTOMY     SHOULDER ARTHROSCOPY WITH ROTATOR CUFF REPAIR Left    wire stitches per patient   SHOULDER ARTHROSCOPY WITH ROTATOR CUFF REPAIR Right     Current Medications: Current Facility-Administered Medications  Medication Dose Route Frequency Provider Last Rate Last Admin   acetaminophen  (TYLENOL ) tablet 650 mg  650 mg Oral Q6H PRN Tex Drilling, NP   650 mg at 11/11/24 2104   alum & mag hydroxide-simeth (MAALOX/MYLANTA) 200-200-20 MG/5ML suspension 30 mL  30 mL Oral Q4H PRN Tex Drilling, NP       cloNIDine  (CATAPRES ) tablet 0.1 mg  0.1 mg Oral BID PRN Tex Drilling, NP       divalproex  (DEPAKOTE ) DR tablet 750 mg  750 mg Oral Q12H Stace Peace, MD   750 mg at 11/12/24 2057   losartan  (COZAAR ) tablet 25 mg  25 mg Oral Daily Nkwenti, Doris, NP   25 mg at 11/12/24 9087   magnesium  hydroxide (MILK OF MAGNESIA) suspension 30 mL  30 mL Oral Daily PRN Tex Drilling, NP       methimazole  (TAPAZOLE )  tablet 10 mg  10 mg Oral Daily Nkwenti, Doris, NP   10 mg at 11/12/24 0912   OLANZapine  (ZYPREXA ) injection 5 mg  5 mg Intramuscular TID PRN Tex Drilling, NP       OLANZapine  (ZYPREXA ) injection 5 mg  5 mg Intramuscular TID PRN Tex Drilling, NP       OLANZapine  (ZYPREXA ) tablet 20 mg  20 mg Oral QHS Madgeline Rayo, MD   20 mg at 11/12/24 2058   OLANZapine  zydis (ZYPREXA ) disintegrating tablet 5 mg  5 mg Oral TID PRN Tex Drilling, NP       OLANZapine  zydis (ZYPREXA ) disintegrating tablet 5 mg  5 mg Oral TID PRN Tex Drilling, NP       pantoprazole  (PROTONIX ) EC tablet 40 mg  40 mg Oral Daily Tex Drilling, NP   40 mg at 11/12/24 0912   traZODone  (DESYREL ) tablet 50 mg  50 mg Oral QHS PRN Tex Drilling, NP   50 mg at 11/12/24 2057    Lab Results:  Results for orders placed or performed during the hospital encounter of 10/17/24 (from the past 48 hours)  CBC with Differential/Platelet     Status: Abnormal   Collection Time: 11/11/24  9:43 PM  Result Value Ref Range   WBC 6.2 4.0 - 10.5 K/uL   RBC 4.13 3.87 - 5.11 MIL/uL   Hemoglobin 10.9 (L) 12.0 - 15.0 g/dL   HCT 66.4 (L) 63.9 - 53.9 %   MCV 81.1 80.0 - 100.0 fL   MCH 26.4 26.0 - 34.0 pg   MCHC 32.5 30.0 - 36.0 g/dL   RDW 85.2 88.4 - 84.4 %   Platelets 219 150 - 400 K/uL   nRBC 0.0 0.0 - 0.2 %   Neutrophils Relative % 45 %   Neutro Abs 2.8 1.7 - 7.7 K/uL   Lymphocytes Relative 46 %   Lymphs Abs 2.8 0.7 - 4.0 K/uL   Monocytes Relative 7 %   Monocytes Absolute 0.4 0.1 - 1.0 K/uL   Eosinophils Relative 1 %   Eosinophils Absolute 0.1 0.0 - 0.5 K/uL   Basophils Relative 1 %   Basophils Absolute 0.0 0.0 - 0.1 K/uL   Immature Granulocytes 0 %   Abs Immature Granulocytes 0.02 0.00 - 0.07 K/uL    Comment: Performed at [facility], [address] [place], [place] [address]  Valproic acid  level     Status: Abnormal   Collection Time: 11/12/24  6:14 AM  Result Value Ref Range   Valproic Acid  Lvl 44 (L) 50 - 100 ug/mL     Comment: Performed at [facility], [address] [place], [place] [address]  Comprehensive metabolic panel     Status: Abnormal   Collection Time: 11/12/24  6:14 AM  Result Value Ref Range   Sodium 140 135 - 145 mmol/L   Potassium 4.3 3.5 - 5.1 mmol/L   Chloride 104 98 - 111 mmol/L   CO2 28 22 - 32 mmol/L   Glucose, Bld 113 (H) 70 - 99 mg/dL    Comment: Glucose reference range applies only to samples taken after fasting for at least 8 hours.   BUN 12 8 - 23 mg/dL   Creatinine, Ser 9.36 0.44 - 1.00 mg/dL   Calcium  10.0 8.9 - 10.3 mg/dL   Total Protein 6.7 6.5 - 8.1 g/dL   Albumin 3.8 3.5 - 5.0 g/dL   AST 12 (L) 15 - 41 U/L   ALT 7 0 - 44 U/L   Alkaline Phosphatase 151 (H) 38 - 126 U/L   Total Bilirubin 0.3 0.0 - 1.2 mg/dL   GFR, Estimated >39 >39 mL/min    Comment: (NOTE) Calculated using the CKD-EPI Creatinine Equation (2021)    Anion gap 9 5 - 15    Comment: Performed at [facility], [address], [place], [place] [address]       Blood Alcohol level:  Lab Results  Component Value Date   [facility] <15 10/14/2024   ETH <10 07/02/2022    Metabolic Disorder Labs: Lab Results  Component Value Date   HGBA1C 5.5 10/14/2024   MPG 111.15 10/14/2024  MPG 137 01/06/2022   Lab Results  Component Value Date   PROLACTIN 5.8 10/14/2024   Lab Results  Component Value Date   CHOL 167 10/14/2024   TRIG 68 10/14/2024   HDL 60 10/14/2024   CHOLHDL 2.8 10/14/2024   VLDL 14 10/14/2024   LDLCALC 93 10/14/2024   LDLCALC 73 03/13/2024    Physical Findings: AIMS:  , ,  ,  ,    CIWA:    COWS:      Psychiatric Specialty Exam:  Presentation  General Appearance:  Appropriate for Environment  Eye Contact: Fleeting  Speech: Normal Rate  Speech Volume: Decreased    Mood and Affect  Mood:fine  Affect: Flat   Thought Process  Thought Processes: impoverished  Orientation:Partial  Thought Content: Chronic delusions Hallucinations:  Denies  Ideas of Reference:Delusions; Paranoia  Suicidal Thoughts: Denies  Homicidal Thoughts: Denies   Sensorium  Memory: Immediate Fair; Recent Fair  Judgment: Impaired  Insight: Shallow   Executive Functions  Concentration: Fair  Attention Span: Fair  Recall: Poor  Fund of Knowledge: Fair  Language: Fair   Psychomotor Activity  Psychomotor Activity: No data recorded  Musculoskeletal: Strength & Muscle Tone: within normal limits Gait & Station: normal Assets  Assets: Manufacturing Systems Engineer; Desire for Improvement; Social Support    Physical Exam: Physical Exam Vitals and nursing note reviewed.    ROS Blood pressure 117/62, pulse 88, temperature 98.8 F (37.1 C), resp. rate 16, height 5' 8 (1.727 m), weight 48.3 kg, SpO2 99%. Body mass index is 16.19 kg/m.  Diagnosis: Principal Problem:   Schizophrenia, paranoid (HCC)    Treatment Plan Summary:  Safety and Monitoring:             -- Involuntary admission to inpatient psychiatric unit for safety, stabilization and treatment             -- Daily contact with patient to assess and evaluate symptoms and progress in treatment             -- Patient's case to be discussed in multi-disciplinary team meeting             -- Observation Level: q15 minute checks             -- Vital signs:  q12 hours             -- Precautions: suicide, elopement, and assault   2. Psychiatric Diagnoses and Treatment:            Depakote   750 mg twice daily-Will check Depakote  level Zyprexa  to 15 mg nightly BMP   -- The risks/benefits/side-effects/alternatives to this medication were discussed in detail with the patient and time was given for questions. The patient consents to medication trial.                -- Metabolic profile and EKG monitoring obtained while on an atypical antipsychotic (BMI: Lipid Panel: HbgA1c: QTc:)              -- Encouraged patient to participate in unit milieu and in scheduled group  therapies  4. Discharge Planning:   -- Social work and case management to assist with discharge planning and identification of hospital follow-up needs prior to discharge  -- Estimated LOS: 3-4 days  Allyn Foil, MD 11/12/2024, 9:03 PM

## 2024-11-12 NOTE — Group Note (Signed)
 Date:  11/12/2024 Time:  11:01 AM  Group Topic/Focus:  Building Self Esteem:   The Focus of this group is helping patients become aware of the effects of self-esteem on their lives, the things they and others do that enhance or undermine their self-esteem, seeing the relationship between their level of self-esteem and the choices they make and learning ways to enhance self-esteem.    Participation Level:  Did Not Attend   Harlene LITTIE Gavel 11/12/2024, 11:01 AM

## 2024-11-12 NOTE — Group Note (Signed)
 Date:  11/12/2024 Time:  4:23 PM  Group Topic/Focus:  Overcoming Stress:   The focus of this group is to define stress and help patients assess their triggers.    Participation Level:  Active  Participation Quality:  Appropriate  Affect:  Appropriate  Cognitive:  Appropriate  Insight: Appropriate  Engagement in Group:  Engaged  Modes of Intervention:  Discussion   Shari [name] 11/12/2024, 4:23 PM

## 2024-11-12 NOTE — Progress Notes (Signed)
   11/12/24 0000  Psych Admission Type (Psych Patients Only)  Admission Status Involuntary  Psychosocial Assessment  Patient Complaints None  Eye Contact Fair  Facial Expression Anxious  Affect Appropriate to circumstance  Speech Logical/coherent  Interaction Minimal  Motor Activity Slow  Appearance/Hygiene Unremarkable  Behavior Characteristics Appropriate to situation  Mood Pleasant  Thought Process  Coherency WDL  Content WDL  Delusions None reported or observed  Perception WDL  Hallucination None reported or observed  Judgment Impaired  Confusion Mild  Danger to Self  Current suicidal ideation? Denies  Agreement Not to Harm Self Yes  Description of Agreement Verbalized  Danger to Others  Danger to Others None reported or observed

## 2024-11-12 NOTE — Group Note (Signed)
 Date:  11/12/2024 Time:  9:21 PM  Group Topic/Focus:  Wrap-Up Group:   The focus of this group is to help patients review their daily goal of treatment and discuss progress on daily workbooks.    Participation Level:  Active  Participation Quality:  Appropriate  Affect:  Appropriate  Cognitive:  Alert  Insight: Appropriate  Engagement in Group:  Engaged  Modes of Intervention:  Discussion  Additional Comments:    [name] [name] 11/12/2024, 9:21 PM

## 2024-11-12 NOTE — Group Note (Signed)
 [facility] LCSW Group Therapy Note   Group Date: 11/12/2024 Start Time: 1330 End Time: 1400   Type of Therapy/Topic:  Group Therapy:  Emotion Regulation  Participation Level:  None   Mood:  Description of Group:    The purpose of this group is to assist patients in learning to regulate negative emotions and experience positive emotions. Patients will be guided to discuss ways in which they have been vulnerable to their negative emotions. These vulnerabilities will be juxtaposed with experiences of positive emotions or situations, and patients challenged to use positive emotions to combat negative ones. Special emphasis will be placed on coping with negative emotions in conflict situations, and patients will process healthy conflict resolution skills.  Therapeutic Goals: Patient will identify two positive emotions or experiences to reflect on in order to balance out negative emotions:  Patient will label two or more emotions that they find the most difficult to experience:  Patient will be able to demonstrate positive conflict resolution skills through discussion or role plays:   Summary of Patient Progress:   X    Therapeutic Modalities:   Cognitive Behavioral Therapy Feelings Identification Dialectical Behavioral Therapy   Shari [name], LCSWA

## 2024-11-12 NOTE — Group Note (Signed)
 Recreation Therapy Group Note   Group Topic:Communication  Group Date: 11/12/2024 Start Time: 1415 End Time: 1500 Facilitators: Celestia Jeoffrey BRAVO, LRT, CTRS Location: Dayroom  Group Description: Name That Food. Pt will randomly select 1 cut paper from laminated stack of popular food images from the 1970's-1990's from LRT. Without showing the group the image they selected, pt will use descriptive words to explain what food they selected. Once the image is correctly guessed, LRT and pts will reminisce and discuss past fond memories associated with the food as well as the importance of communication.  Goal Area(s) Addressed: Patient will increase communication skills.  Patient will increase frustration tolerance skills. Patient will reminisce a fond memory in their life.     Affect/Mood: Appropriate   Participation Level: Active and Engaged   Participation Quality: Independent   Behavior: Calm and Cooperative   Speech/Thought Process: Coherent   Insight: Fair   Judgement: Fair    Modes of Intervention: Guided Discussion, Rapport Building, and Socialization   Patient Response to Interventions:  Receptive   Education Outcome:  In group clarification offered    Clinical Observations/Individualized Feedback: Ethelwyn was active in their participation of session activities and group discussion. Pt interacted well with LRT and peers duration of session.    Plan: Continue to engage patient in RT group sessions 2-3x/week.   Jeoffrey BRAVO Celestia, LRT, CTRS 11/12/2024 4:26 PM

## 2024-11-12 NOTE — [facility] Counselor (Signed)
 CSW contacted [name], APS social worker [phone]) in regards to petition for guardianship.   CSW inquired about updates.   CSW awaiting response at this time.   Lum Croft, MSW, [place] 11/12/2024 10:17 AM

## 2024-11-12 NOTE — Plan of Care (Signed)
  Problem: Education: Goal: Mental status will improve Outcome: Progressing   Problem: Activity: Goal: Sleeping patterns will improve Outcome: Progressing   Problem: Health Behavior/Discharge Planning: Goal: Compliance with treatment plan for underlying cause of condition will improve Outcome: Progressing   Problem: Safety: Goal: Periods of time without injury will increase Outcome: Progressing

## 2024-11-12 NOTE — Plan of Care (Signed)
   Problem: Coping: Goal: Ability to verbalize frustrations and anger appropriately will improve Outcome: Progressing

## 2024-11-12 NOTE — [facility] Counselor (Signed)
 CSW received return information from [place] via text. Arch reports that she needs the [facility] and progress notes for pt.   CSW sent FL2 and last 7 days of progress notes via email.   Lum Croft, MSW, [place] 11/12/2024 2:39 PM

## 2024-11-13 NOTE — Group Note (Signed)
 Date:  11/13/2024 Time:  10:26 AM  Group Topic/Focus:  Morning Stretch with Norleen, Movement therapy    Participation Level:  Did Not Attend    [name] 11/13/2024, 10:26 AM

## 2024-11-13 NOTE — Progress Notes (Signed)
°   11/12/24 2000  Psych Admission Type (Psych Patients Only)  Admission Status Involuntary  Psychosocial Assessment  Patient Complaints None  Eye Contact Fair  Facial Expression Anxious  Affect Appropriate to circumstance  Speech Logical/coherent  Interaction Minimal  Motor Activity Slow  Appearance/Hygiene Unremarkable  Behavior Characteristics Appropriate to situation  Mood Pleasant  Thought Process  Coherency WDL  Content WDL  Delusions None reported or observed  Perception WDL  Hallucination None reported or observed  Judgment Impaired  Confusion Mild  Danger to Self  Current suicidal ideation? Denies  Agreement Not to Harm Self Yes  Description of Agreement verbal  Danger to Others  Danger to Others None reported or observed

## 2024-11-13 NOTE — Group Note (Signed)
 Date:  11/13/2024 Time:  9:23 PM  Group Topic/Focus:  Self Care:   The focus of this group is to help patients understand the importance of self-care in order to improve or restore emotional, physical, spiritual, interpersonal, and financial health.    Participation Level:  Active  Participation Quality:  Appropriate  Affect:  Appropriate  Cognitive:  Appropriate  Insight: Appropriate  Engagement in Group:  Engaged  Modes of Intervention:  Education  Additional Comments:    Laymon ONEIDA Finder 11/13/2024, 9:23 PM

## 2024-11-13 NOTE — Progress Notes (Signed)
°   11/13/24 1000  Psych Admission Type (Psych Patients Only)  Admission Status Involuntary  Psychosocial Assessment  Patient Complaints None  Eye Contact Fair  Facial Expression Anxious  Affect Appropriate to circumstance  Speech Logical/coherent  Interaction Minimal  Motor Activity Slow  Appearance/Hygiene Unremarkable  Behavior Characteristics Appropriate to situation  Mood Pleasant  Thought Process  Coherency WDL  Content WDL  Delusions None reported or observed  Perception WDL  Hallucination None reported or observed  Judgment Impaired  Confusion Mild  Danger to Self  Current suicidal ideation? Denies  Agreement Not to Harm Self Yes  Description of Agreement verbal  Danger to Others  Danger to Others None reported or observed

## 2024-11-13 NOTE — Progress Notes (Signed)
 [facility] MD Progress Note  11/13/2024 1:56 PM Shari [name]  MRN:  [id number] Patient is a 68 year old female with a history of Schizoaffective Disorder, Bipolar Type who presents via [facility] under IVC, initiated by CM, Falencio with re-entry program, to [facility] for assessment. Per IVC, [name] has been diagnosed with Schizophrenia and is refusing to take medication. [name] isn't sleeping. Residents report her screaming and yelling all night long.  [name] responding to internal stimuli.  [name] verbally aggressive.  The respondent has locked all of the residents inside of the transitional housing home.  They could not leave the home which prompted the IVC.  Residents did not have access to food, the restroom or anything as a result of being locked in.  [name] is currently under Adult protective services care. Patient is admitted to [facility] unit with Q15 min safety monitoring. Multidisciplinary team approach is offered. Medication management; group/milieu therapy is offered.   Subjective:  Chart reviewed, case discussed in multidisciplinary meeting, patient seen during rounds.   Patient is noted to be resting in bed.  Patient discussed and updated on her Depakote  being adjusted to 500 mg in the morning to reduce her daytime sedation and 1500 mg nightly.  [name] taking her Zyprexa  at night.  Patient remains confused about her living situation and denies having any family support in the community.  Patient denies SI/HI/plan and denies hallucinations.  Provider tried to discuss APS taking over guardianship and patient continues to lack insight into her cognitive limitations and insist that she is her own guardian and that she has a house to go back to which was reportedly a transitional house. Past Psychiatric History: see h&P Family History:  Family History  Problem Relation Age of Onset   Diabetes Mother    CAD Mother    Lung cancer Father    Social History:  Social  History   Substance and Sexual Activity  Alcohol Use Yes   Comment: beer occasionally     Social History   Substance and Sexual Activity  Drug Use No    Social History   Socioeconomic History   Marital status: Widowed    Spouse name: Not on file   Number of children: Not on file   Years of education: Not on file   Highest education level: Not on file  Occupational History   Not on file  Tobacco Use   Smoking status: Every Day    Current packs/day: 0.50    Average packs/day: 0.5 packs/day for 40.0 years (20.0 ttl pk-yrs)    Types: Cigarettes   Smokeless tobacco: Never  Vaping Use   Vaping status: Never Used  Substance and Sexual Activity   Alcohol use: Yes    Comment: beer occasionally   Drug use: No   Sexual activity: Not Currently    Birth control/protection: Post-menopausal  Other Topics Concern   Not on file  Social History Narrative   Not on file   Social Drivers of Health   Financial Resource Strain: Not on file  Food Insecurity: No Food Insecurity (10/17/2024)   Hunger Vital Sign    Worried About Running Out of Food in the Last Year: Never true    Ran Out of Food in the Last Year: Never true  Transportation Needs: No Transportation Needs (10/17/2024)   PRAPARE - Administrator, Civil Service (Medical): No    Lack of Transportation (Non-Medical): No  Recent Concern: Transportation Needs -  Unmet Transportation Needs (10/14/2024)   PRAPARE - Administrator, Civil Service (Medical): Yes    Lack of Transportation (Non-Medical): Yes  Physical Activity: Not on file  Stress: Not on file  Social Connections: Unknown (10/17/2024)   Social Connection and Isolation Panel    Frequency of Communication with Friends and Family: Patient unable to answer    Frequency of Social Gatherings with Friends and Family: Patient unable to answer    Attends Religious Services: Patient unable to answer    Active Member of Clubs or Organizations: Patient  declined    Attends Banker Meetings: Patient unable to answer    Marital Status: Widowed   Past Medical History:  Past Medical History:  Diagnosis Date   Eye globe prosthesis    GERD (gastroesophageal reflux disease)    History of blood transfusion 1973   related to abscess burst in my stomach   Hyperlipemia    Hyperlipidemia    Hypertension    Osteoarthritis of back    Lowerback    SVD (spontaneous vaginal delivery)    x 1, baby died at 2 wks of age   Type II diabetes mellitus (HCC)     Past Surgical History:  Procedure Laterality Date   APPENDECTOMY     COLONOSCOPY     DILATATION & CURETTAGE/HYSTEROSCOPY WITH MYOSURE N/A 02/25/2015   Procedure: DILATATION & CURETTAGE/HYSTEROSCOPY WITH MYOSURE;  Surgeon: Truman Corona, MD;  Location: [facility];  Service: Gynecology;  Laterality: N/A;   DILATION AND CURETTAGE OF UTERUS     ENUCLEATION Right 03/16/2017   ENUCLEATION Right 03/16/2017   Procedure: ENUCLEATION RIGHT EYE;  Surgeon: Loyd Kathryne Palm, MD;  Location: [facility] OR;  Service: Ophthalmology;  Laterality: Right;   EYE SURGERY     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6, 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   EYE SURGERY Right ~ 1974   S/P initial eye injury; scissors stuck in my eye   LAPAROSCOPIC CHOLECYSTECTOMY     SHOULDER ARTHROSCOPY WITH ROTATOR CUFF REPAIR Left    wire stitches per patient   SHOULDER ARTHROSCOPY WITH ROTATOR CUFF REPAIR Right     Current Medications: Current Facility-Administered Medications  Medication Dose Route Frequency Provider Last Rate Last Admin   acetaminophen  (TYLENOL ) tablet 650 mg  650 mg Oral Q6H PRN Tex Drilling, NP   650 mg at 11/11/24 2104   alum & mag hydroxide-simeth (MAALOX/MYLANTA) 200-200-20 MG/5ML suspension 30 mL  30 mL Oral Q4H PRN Nkwenti, Drilling, NP       cloNIDine  (CATAPRES ) tablet 0.1 mg  0.1 mg Oral BID PRN Tex Drilling, NP       divalproex  (DEPAKOTE ) DR tablet 1,500 mg  1,500 mg Oral QHS Marinda Tyer, MD       divalproex  (DEPAKOTE )  DR tablet 500 mg  500 mg Oral q AM Arcenia Scarbro, MD   500 mg at 11/13/24 9349   losartan  (COZAAR ) tablet 25 mg  25 mg Oral Daily Nkwenti, Doris, NP   25 mg at 11/13/24 0903   magnesium  hydroxide (MILK OF MAGNESIA) suspension 30 mL  30 mL Oral Daily PRN Tex Drilling, NP       methimazole  (TAPAZOLE ) tablet 10 mg  10 mg Oral Daily Nkwenti, Doris, NP   10 mg at 11/13/24 9097   OLANZapine  (ZYPREXA ) injection 5 mg  5 mg Intramuscular TID PRN Tex Drilling, NP       OLANZapine  (ZYPREXA ) injection 5 mg  5 mg Intramuscular TID PRN Tex Drilling,  NP       OLANZapine  (ZYPREXA ) tablet 20 mg  20 mg Oral QHS Julietta Batterman, MD   20 mg at 11/12/24 2058   OLANZapine  zydis (ZYPREXA ) disintegrating tablet 5 mg  5 mg Oral TID PRN Tex Drilling, NP       OLANZapine  zydis (ZYPREXA ) disintegrating tablet 5 mg  5 mg Oral TID PRN Tex Drilling, NP       pantoprazole  (PROTONIX ) EC tablet 40 mg  40 mg Oral Daily Tex Drilling, NP   40 mg at 11/13/24 9096   traZODone  (DESYREL ) tablet 50 mg  50 mg Oral QHS PRN Tex Drilling, NP   50 mg at 11/12/24 2057    Lab Results:  Results for orders placed or performed during the hospital encounter of 10/17/24 (from the past 48 hours)  CBC with Differential/Platelet     Status: Abnormal   Collection Time: 11/11/24  9:43 PM  Result Value Ref Range   WBC 6.2 4.0 - 10.5 K/uL   RBC 4.13 3.87 - 5.11 MIL/uL   Hemoglobin 10.9 (L) 12.0 - 15.0 g/dL   HCT 66.4 (L) 63.9 - 53.9 %   MCV 81.1 80.0 - 100.0 fL   MCH 26.4 26.0 - 34.0 pg   MCHC 32.5 30.0 - 36.0 g/dL   RDW 85.2 88.4 - 84.4 %   Platelets 219 150 - 400 K/uL   nRBC 0.0 0.0 - 0.2 %   Neutrophils Relative % 45 %   Neutro Abs 2.8 1.7 - 7.7 K/uL   Lymphocytes Relative 46 %   Lymphs Abs 2.8 0.7 - 4.0 K/uL   Monocytes Relative 7 %   Monocytes Absolute 0.4 0.1 - 1.0 K/uL   Eosinophils Relative 1 %   Eosinophils Absolute 0.1 0.0 - 0.5 K/uL   Basophils Relative 1 %   Basophils Absolute 0.0 0.0 - 0.1 K/uL   Immature  Granulocytes 0 %   Abs Immature Granulocytes 0.02 0.00 - 0.07 K/uL    Comment: Performed at [facility], [address] [place], [place] [address]  Valproic acid  level     Status: Abnormal   Collection Time: 11/12/24  6:14 AM  Result Value Ref Range   Valproic Acid  Lvl 44 (L) 50 - 100 ug/mL    Comment: Performed at [facility], [address] [place], [place] [address]  Comprehensive metabolic panel     Status: Abnormal   Collection Time: 11/12/24  6:14 AM  Result Value Ref Range   Sodium 140 135 - 145 mmol/L   Potassium 4.3 3.5 - 5.1 mmol/L   Chloride 104 98 - 111 mmol/L   CO2 28 22 - 32 mmol/L   Glucose, Bld 113 (H) 70 - 99 mg/dL    Comment: Glucose reference range applies only to samples taken after fasting for at least 8 hours.   BUN 12 8 - 23 mg/dL   Creatinine, Ser 9.36 0.44 - 1.00 mg/dL   Calcium  10.0 8.9 - 10.3 mg/dL   Total Protein 6.7 6.5 - 8.1 g/dL   Albumin 3.8 3.5 - 5.0 g/dL   AST 12 (L) 15 - 41 U/L   ALT 7 0 - 44 U/L   Alkaline Phosphatase 151 (H) 38 - 126 U/L   Total Bilirubin 0.3 0.0 - 1.2 mg/dL   GFR, Estimated >39 >39 mL/min    Comment: (NOTE) Calculated using the CKD-EPI Creatinine Equation (2021)    Anion gap 9 5 - 15    Comment: Performed at  [facility], [address] [place], [place] [address]       Blood Alcohol level:  Lab Results  Component Value Date   [facility] <15 10/14/2024   ETH <10 07/02/2022    Metabolic Disorder Labs: Lab Results  Component Value Date   HGBA1C 5.5 10/14/2024   MPG 111.15 10/14/2024   MPG 137 01/06/2022   Lab Results  Component Value Date   PROLACTIN 5.8 10/14/2024   Lab Results  Component Value Date   CHOL 167 10/14/2024   TRIG 68 10/14/2024   HDL 60 10/14/2024   CHOLHDL 2.8 10/14/2024   VLDL 14 10/14/2024   LDLCALC 93 10/14/2024   LDLCALC 73 03/13/2024    Physical Findings: AIMS:  , ,  ,  ,    CIWA:    COWS:      Psychiatric Specialty Exam:  Presentation   General Appearance:  Appropriate for Environment  Eye Contact: Fleeting  Speech: Normal Rate  Speech Volume: Decreased    Mood and Affect  Mood:fine  Affect: Flat   Thought Process  Thought Processes: impoverished  Orientation:Partial  Thought Content: Chronic delusions Hallucinations: Denies  Ideas of Reference:Delusions; Paranoia  Suicidal Thoughts: Denies  Homicidal Thoughts: Denies   Sensorium  Memory: Immediate Fair; Recent Fair  Judgment: Impaired  Insight: Shallow   Executive Functions  Concentration: Fair  Attention Span: Fair  Recall: Poor  Fund of Knowledge: Fair  Language: Fair   Psychomotor Activity  Psychomotor Activity: No data recorded  Musculoskeletal: Strength & Muscle Tone: within normal limits Gait & Station: normal Assets  Assets: Manufacturing Systems Engineer; Desire for Improvement; Social Support    Physical Exam: Physical Exam Vitals and nursing note reviewed.    ROS Blood pressure 122/64, pulse 87, temperature 98.4 F (36.9 C), resp. rate 15, height 5' 8 (1.727 m), weight 48.3 kg, SpO2 97%. Body mass index is 16.19 kg/m.  Diagnosis: Principal Problem:   Schizophrenia, paranoid (HCC)    Treatment Plan Summary:  Safety and Monitoring:             -- Involuntary admission to inpatient psychiatric unit for safety, stabilization and treatment             -- Daily contact with patient to assess and evaluate symptoms and progress in treatment             -- Patient's case to be discussed in multi-disciplinary team meeting             -- Observation Level: q15 minute checks             -- Vital signs:  q12 hours             -- Precautions: suicide, elopement, and assault   2. Psychiatric Diagnoses and Treatment:            Depakote  changed to 500 mg every morning and 1500 mg nightly-in next 5 days 11/13/2024 Depakote  level-44 Zyprexa  to 15 mg nightly BMP-within normal limits Occupational Therapy  evaluation for assessing appropriate level of care -- The risks/benefits/side-effects/alternatives to this medication were discussed in detail with the patient and time was given for questions. The patient consents to medication trial.                -- Metabolic profile and EKG monitoring obtained while on an atypical antipsychotic (BMI: Lipid Panel: HbgA1c: QTc:)              -- Encouraged patient to participate in  unit milieu and in scheduled group therapies  4. Discharge Planning:   -- Social work and case management to assist with discharge planning and identification of hospital follow-up needs prior to discharge  -- Estimated LOS: 3-4 days  Allyn Foil, MD 11/13/2024, 1:56 PM

## 2024-11-13 NOTE — Plan of Care (Signed)
  Problem: Education: Goal: Mental status will improve Outcome: Progressing   Problem: Activity: Goal: Interest or engagement in activities will improve Outcome: Progressing Goal: Sleeping patterns will improve Outcome: Progressing   Problem: Coping: Goal: Ability to demonstrate self-control will improve Outcome: Progressing

## 2024-11-14 MED ORDER — DIVALPROEX SODIUM 250 MG PO DR TAB
500.0000 mg | DELAYED_RELEASE_TABLET | Freq: Every morning | ORAL | Status: AC
  Administered 2024-11-14 – 2025-01-10 (×58): 500 mg via ORAL
  Filled 2024-11-14 (×51): qty 2

## 2024-11-14 NOTE — Plan of Care (Signed)
  Problem: Education: Goal: Mental status will improve Outcome: Progressing   Problem: Health Behavior/Discharge Planning: Goal: Compliance with treatment plan for underlying cause of condition will improve Outcome: Progressing

## 2024-11-14 NOTE — Group Note (Signed)
 LCSW Group Therapy Note  Group Date: 11/14/2024 Start Time: 1330 End Time: 1400   Type of Therapy and Topic:  Group Therapy - Healthy vs Unhealthy Coping Skills  Participation Level:  Minimal   Description of Group The focus of this group was to determine what unhealthy coping techniques typically are used by group members and what healthy coping techniques would be helpful in coping with various problems. Patients were guided in becoming aware of the differences between healthy and unhealthy coping techniques. Patients were asked to identify 2-3 healthy coping skills they would like to learn to use more effectively.  Therapeutic Goals Patients learned that coping is what human beings do all day long to deal with various situations in their lives Patients defined and discussed healthy vs unhealthy coping techniques Patients identified their preferred coping techniques and identified whether these were healthy or unhealthy Patients determined 2-3 healthy coping skills they would like to become more familiar with and use more often. Patients provided support and ideas to each other   Summary of Patient Progress:  During group, Shari [name] expressed ways that she was coped with past issues. Patient proved open to input from peers and feedback from CSW. Patient demonstrated fair insight into the subject matter, was respectful of peers, and participated throughout the entire session.   Therapeutic Modalities Cognitive Behavioral Therapy Motivational Interviewing  Shari [name], LCSWA 11/14/2024  2:11 PM

## 2024-11-14 NOTE — Group Note (Signed)
 Date:  11/14/2024 Time:  8:50 PM  Group Topic/Focus:  Healthy Communication:   The focus of this group is to discuss communication, barriers to communication, as well as healthy ways to communicate with others.    Participation Level:  Active  Participation Quality:  Appropriate  Affect:  Appropriate  Cognitive:  Appropriate  Insight: Good  Engagement in Group:  Engaged  Modes of Intervention:  Discussion  Additional Comments:    Shari [name] 11/14/2024, 8:50 PM

## 2024-11-14 NOTE — Plan of Care (Signed)
   Problem: Education: Goal: Knowledge of Shari [name] General Education information/materials will improve Outcome: Progressing Goal: Emotional status will improve Outcome: Progressing Goal: Mental status will improve Outcome: Progressing Goal: Verbalization of understanding the information provided will improve Outcome: Progressing   Problem: Activity: Goal: Interest or engagement in activities will improve Outcome: Progressing Goal: Sleeping patterns will improve Outcome: Progressing   Problem: Coping: Goal: Ability to verbalize frustrations and anger appropriately will improve Outcome: Progressing Goal: Ability to demonstrate self-control will improve Outcome: Progressing

## 2024-11-14 NOTE — Group Note (Signed)
 Date:  11/14/2024 Time:  3:56 PM  Group Topic/Focus:     Music for gero psych (geriatric psychology/dementia care) focuses on familiar, personalized music from a person's memory bump to reduce agitation, improve mood, spark memories, and enhance communication, with genres like soft classical, jazz, hymns, and old favorites (e.g., Singing in the [place], [place], big band) being effective, alongside tailored music therapy techniques that match tempo and use simple melodies for calming effects.   Participation Level:  Active  Participation Quality:  Appropriate  Affect:  Appropriate  Cognitive:  Appropriate  Insight: Appropriate  Engagement in Group:  Engaged  Modes of Intervention:  Socialization  Additional Comments:  N/A  Shari [name] Gavel 11/14/2024, 3:56 PM

## 2024-11-14 NOTE — Progress Notes (Signed)
 Behavior:  Pleasant and cooperative.   Psych assessment:  Denies SI/HI and AVH.  Interaction / Group attendance:  Present in the milieu.  Minimal interaction with peers and staff.  Appropriate interaction with staff.  Medication/ PRNs: Compliant.  Pain:  Denies.  15 min checks in place for safety.

## 2024-11-14 NOTE — BH IP Treatment Plan (Signed)
 Interdisciplinary Treatment and Diagnostic Plan Update  11/14/2024 Time of Session: 12:30 PM  Shari [name] MRN: [id number]  Principal Diagnosis: Schizophrenia, paranoid (HCC)  Secondary Diagnoses: Principal Problem:   Schizophrenia, paranoid (HCC)   Current Medications:  Current Facility-Administered Medications  Medication Dose Route Frequency Provider Last Rate Last Admin   acetaminophen  (TYLENOL ) tablet 650 mg  650 mg Oral Q6H PRN Tex Drilling, NP   650 mg at 11/13/24 2110   alum & mag hydroxide-simeth (MAALOX/MYLANTA) 200-200-20 MG/5ML suspension 30 mL  30 mL Oral Q4H PRN Tex Drilling, NP       cloNIDine  (CATAPRES ) tablet 0.1 mg  0.1 mg Oral BID PRN Tex Drilling, NP       divalproex  (DEPAKOTE ) DR tablet 1,500 mg  1,500 mg Oral QHS Jadapalle, Sree, MD   1,500 mg at 11/13/24 2110   divalproex  (DEPAKOTE ) DR tablet 500 mg  500 mg Oral q AM Jadapalle, Sree, MD   500 mg at 11/14/24 0920   losartan  (COZAAR ) tablet 25 mg  25 mg Oral Daily Nkwenti, Doris, NP   25 mg at 11/14/24 0921   magnesium  hydroxide (MILK OF MAGNESIA) suspension 30 mL  30 mL Oral Daily PRN Tex Drilling, NP       methimazole  (TAPAZOLE ) tablet 10 mg  10 mg Oral Daily Nkwenti, Doris, NP   10 mg at 11/14/24 0920   OLANZapine  (ZYPREXA ) injection 5 mg  5 mg Intramuscular TID PRN Tex Drilling, NP       OLANZapine  (ZYPREXA ) injection 5 mg  5 mg Intramuscular TID PRN Tex Drilling, NP       OLANZapine  (ZYPREXA ) tablet 20 mg  20 mg Oral QHS Jadapalle, Sree, MD   20 mg at 11/13/24 2109   OLANZapine  zydis (ZYPREXA ) disintegrating tablet 5 mg  5 mg Oral TID PRN Tex Drilling, NP       OLANZapine  zydis (ZYPREXA ) disintegrating tablet 5 mg  5 mg Oral TID PRN Tex Drilling, NP       pantoprazole  (PROTONIX ) EC tablet 40 mg  40 mg Oral Daily Nkwenti, Doris, NP   40 mg at 11/14/24 0920   traZODone  (DESYREL ) tablet 50 mg  50 mg Oral QHS PRN Tex Drilling, NP   50 mg at 11/13/24 2110   PTA Medications: Medications Prior to  Admission  Medication Sig Dispense Refill Last Dose/Taking   Ascorbic Acid (VITAMIN C PO) Take 1 tablet by mouth daily. (Patient not taking: Reported on 10/14/2024)      divalproex  (DEPAKOTE ) 500 MG DR tablet Take 1 tablet (500 mg total) by mouth at bedtime. (Patient not taking: Reported on 10/14/2024) 30 tablet 1    losartan  (COZAAR ) 25 MG tablet Take 1 tablet (25 mg total) by mouth daily. (Patient not taking: Reported on 10/14/2024) 90 tablet 1    methimazole  (TAPAZOLE ) 10 MG tablet Take 1 tablet (10 mg total) by mouth daily. (Patient not taking: Reported on 10/14/2024) 90 tablet 1    OLANZapine  (ZYPREXA ) 10 MG tablet Take 1 tablet (10 mg total) by mouth at bedtime. (Patient not taking: Reported on 10/14/2024) 30 tablet 1    pantoprazole  (PROTONIX ) 40 MG tablet Take 1 tablet (40 mg total) by mouth daily. (Patient not taking: Reported on 10/14/2024) 90 tablet 0    propranolol  (INDERAL ) 10 MG tablet Take 1 tablet (10 mg total) by mouth 2 (two) times daily. (Patient not taking: Reported on 10/14/2024) 180 tablet 1    VITAMIN D PO Take 1 tablet by mouth daily. (Patient not  taking: Reported on 10/14/2024)      VITAMIN E PO Take 1 tablet by mouth daily. (Patient not taking: Reported on 10/14/2024)       Patient Stressors: Medication change or noncompliance    Patient Strengths: Communication skills   Treatment Modalities: Medication Management, Group therapy, Case management,  1 to 1 session with clinician, Psychoeducation, Recreational therapy.   Physician Treatment Plan for Primary Diagnosis: Schizophrenia, paranoid (HCC) Long Term Goal(s): Improvement in symptoms so as ready for discharge   Short Term Goals: Ability to identify changes in lifestyle to reduce recurrence of condition will improve Ability to verbalize feelings will improve Ability to disclose and discuss suicidal ideas Ability to demonstrate self-control will improve Ability to identify and develop effective coping behaviors  will improve Ability to maintain clinical measurements within normal limits will improve  Medication Management: Evaluate patient's response, side effects, and tolerance of medication regimen.  Therapeutic Interventions: 1 to 1 sessions, Unit Group sessions and Medication administration.  Evaluation of Outcomes: Progressing  Physician Treatment Plan for Secondary Diagnosis: Principal Problem:   Schizophrenia, paranoid (HCC)  Long Term Goal(s): Improvement in symptoms so as ready for discharge   Short Term Goals: Ability to identify changes in lifestyle to reduce recurrence of condition will improve Ability to verbalize feelings will improve Ability to disclose and discuss suicidal ideas Ability to demonstrate self-control will improve Ability to identify and develop effective coping behaviors will improve Ability to maintain clinical measurements within normal limits will improve     Medication Management: Evaluate patient's response, side effects, and tolerance of medication regimen.  Therapeutic Interventions: 1 to 1 sessions, Unit Group sessions and Medication administration.  Evaluation of Outcomes: Progressing   RN Treatment Plan for Primary Diagnosis: Schizophrenia, paranoid (HCC) Long Term Goal(s): Knowledge of disease and therapeutic regimen to maintain health will improve  Short Term Goals: Ability to remain free from injury will improve, Ability to verbalize frustration and anger appropriately will improve, Ability to demonstrate self-control, Ability to participate in decision making will improve, Ability to verbalize feelings will improve, Ability to disclose and discuss suicidal ideas, Ability to identify and develop effective coping behaviors will improve, and Compliance with prescribed medications will improve  Medication Management: RN will administer medications as ordered by provider, will assess and evaluate patient's response and provide education to patient for  prescribed medication. RN will report any adverse and/or side effects to prescribing provider.  Therapeutic Interventions: 1 on 1 counseling sessions, Psychoeducation, Medication administration, Evaluate responses to treatment, Monitor vital signs and CBGs as ordered, Perform/monitor CIWA, COWS, AIMS and Fall Risk screenings as ordered, Perform wound care treatments as ordered.  Evaluation of Outcomes: Progressing   LCSW Treatment Plan for Primary Diagnosis: Schizophrenia, paranoid (HCC) Long Term Goal(s): Safe transition to appropriate next level of care at discharge, Engage patient in therapeutic group addressing interpersonal concerns.  Short Term Goals: Engage patient in aftercare planning with referrals and resources, Increase social support, Increase ability to appropriately verbalize feelings, Increase emotional regulation, Facilitate acceptance of mental health diagnosis and concerns, Facilitate patient progression through stages of change regarding substance use diagnoses and concerns, Identify triggers associated with mental health/substance abuse issues, and Increase skills for wellness and recovery  Therapeutic Interventions: Assess for all discharge needs, 1 to 1 time with Social worker, Explore available resources and support systems, Assess for adequacy in community support network, Educate family and significant other(s) on suicide prevention, Complete Psychosocial Assessment, Interpersonal group therapy.  Evaluation of Outcomes: Progressing  Progress in Treatment: Attending groups: Yes. and No.  Participating in groups: Yes. and No.  Taking medication as prescribed: Yes.  Toleration medication: Yes.  Family/Significant other contact made: No, will contact:  if given permission.  Patient understands diagnosis: No.  Discussing patient identified problems/goals with staff: No.   Denies suicidal/homicidal ideation: Yes.  Issues/concerns per patient self-inventory: No. Other:  none.    New problem(s) identified: No, Describe:  None identified 10/23/24 Update: No changes at this time. 10/28/24 Update: No changes at this time. Update 11/03/24: No changes at this time Update 11/09/24: No changes at this time Update 11/14/24: No changes at this time       New Short Term/Long Term Goal(s): elimination of symptoms of psychosis, medication management for mood stabilization; elimination of SI thoughts; development of comprehensive mental wellness plan. 10/23/24 Update: No changes at this time. 10/28/24 Update: No changes at this time. Update 11/03/24: No changes at this time Update 11/09/24: No changes at this time Update 11/14/24: No changes at this time     Patient Goals:  I haven't set any goals for the hospital yet. I don't need psychiatric help 10/23/24 Update: No changes at this time. 10/28/24 Update: No changes at this time. Update 11/03/24: No changes at this time Update 11/09/24: No changes at this time Update 11/14/24: No changes at this time     Discharge Plan or Barriers: CSW will assist with appropriate discharge plan 10/23/24 Update: No changes at this time. 10/28/24 Update: CSW to continue to meet with DSS to engage in safe discharge planning and connect patient with appropriate resources. Update 11/03/24: No changes at this time Update 11/09/24: No changes at this time Update 11/14/24: APS drafting a petition for guardianship, awaiting updates regarding scheduled court date at this time. APS has received FL2 to begin placement search      Reason for Continuation of Hospitalization: Delusions  Medication stabilization 10/28/24 Update: Delusions  Medication stabilization    Estimated Length of Stay: 1 to 7 days 10/23/24 Update: No changes at this time. 10/28/24 Update: TBD. Update 11/03/24: TBD Update 11/09/24: TBD Update 11/14/24: TBD     Last 3 [place] Suicide Severity Risk Score: Flowsheet Row Admission (Current) from 10/17/2024 in [facility] ED from 10/14/2024 in [facility] ED to Hosp-Admission (Discharged) from 03/13/2024 in [place] [facility]  C-SSRS RISK CATEGORY No Risk No Risk No Risk    Last PHQ 2/9 Scores:     No data to display          Scribe for Treatment Team: Lum JONETTA Raynaldo ISRAEL 11/14/2024 3:37 PM

## 2024-11-14 NOTE — Group Note (Signed)
 Date:  11/14/2024 Time:  10:56 AM  Group Topic/Focus:  Self Care:   The focus of this group is to help patients understand the importance of self-care in order to improve or restore emotional, physical, spiritual, interpersonal, and financial health. We did chair yoga to stretch our body's and get moving.    Participation Level:  Active  Participation Quality:  Appropriate  Affect:  Appropriate  Cognitive:  Alert  Insight: Appropriate  Engagement in Group:  Engaged  Modes of Intervention:  Activity  Additional Comments:    Leigh VEAR Pais 11/14/2024, 10:56 AM

## 2024-11-14 NOTE — Progress Notes (Signed)
 [facility] MD Progress Note  11/14/2024 1:22 PM Shari [name]  MRN:  [id number] Patient is a 68 year old female with a history of Schizoaffective Disorder, Bipolar Type who presents via [employer] under IVC, initiated by CM, Falencio with re-entry program, to [facility] for assessment. Per IVC, [name] has been diagnosed with Schizophrenia and is refusing to take medication. [name] isn't sleeping. Residents report her screaming and yelling all night long.  [name] responding to internal stimuli.  [name] verbally aggressive.  The respondent has locked all of the residents inside of the transitional housing home.  They could not leave the home which prompted the IVC.  Residents did not have access to food, the restroom or anything as a result of being locked in.  [name] is currently under Adult protective services care. Patient is admitted to [facility] unit with Q15 min safety monitoring. Multidisciplinary team approach is offered. Medication management; group/milieu therapy is offered.   Subjective:  Chart reviewed, case discussed in multidisciplinary meeting, patient seen during rounds.   Patient is noted to be sitting in the day area.  She offers no complaints.  She remains discharge focused and wants to get back to her house in spite of explaining that it was a transitional house.  [name] unable to comprehend the information that APS is submitting for the legal guardianship.  Patient remains in denial and states she can take care of herself.  Patient is taking her medications.  Patient is educated about adjusting the Depakote  dosage in the morning so that she is less sedated.  She denies SI/HI/plan and denies hallucinations.  Had peer to peer review today and discussed the plan to check Depakote  levels in the next few days, request OT evaluation to recommend appropriate level of placement depending on patient's cognitive needs. Past Psychiatric History: see h&P Family History:   Family History  Problem Relation Age of Onset   Diabetes Mother    CAD Mother    Lung cancer Father    Social History:  Social History   Substance and Sexual Activity  Alcohol Use Yes   Comment: beer occasionally     Social History   Substance and Sexual Activity  Drug Use No    Social History   Socioeconomic History   Marital status: Widowed    Spouse name: Not on file   Number of children: Not on file   Years of education: Not on file   Highest education level: Not on file  Occupational History   Not on file  Tobacco Use   Smoking status: Every Day    Current packs/day: 0.50    Average packs/day: 0.5 packs/day for 40.0 years (20.0 ttl pk-yrs)    Types: Cigarettes   Smokeless tobacco: Never  Vaping Use   Vaping status: Never Used  Substance and Sexual Activity   Alcohol use: Yes    Comment: beer occasionally   Drug use: No   Sexual activity: Not Currently    Birth control/protection: Post-menopausal  Other Topics Concern   Not on file  Social History Narrative   Not on file   Social Drivers of Health   Tobacco Use: High Risk (11/02/2024)   Patient History    Smoking Tobacco Use: Every Day    Smokeless Tobacco Use: Never    Passive Exposure: Not on file  Financial Resource Strain: Not on file  Food Insecurity: No Food Insecurity (10/17/2024)   Epic    Worried About Running  Out of Food in the Last Year: Never true    Ran Out of Food in the Last Year: Never true  Transportation Needs: No Transportation Needs (10/17/2024)   Epic    Lack of Transportation (Medical): No    Lack of Transportation (Non-Medical): No  Recent Concern: Transportation Needs - Unmet Transportation Needs (10/14/2024)   Epic    Lack of Transportation (Medical): Yes    Lack of Transportation (Non-Medical): Yes  Physical Activity: Not on file  Stress: Not on file  Social Connections: Unknown (10/17/2024)   Social Connection and Isolation Panel    Frequency of Communication with  Friends and Family: Patient unable to answer    Frequency of Social Gatherings with Friends and Family: Patient unable to answer    Attends Religious Services: Patient unable to answer    Active Member of Clubs or Organizations: Patient declined    Attends Banker Meetings: Patient unable to answer    Marital Status: Widowed  Depression (PHQ2-9): Not on file  Alcohol Screen: Low Risk (10/17/2024)   Alcohol Screen    Last Alcohol Screening Score (AUDIT): 0  Housing: Low Risk (10/17/2024)   Epic    Unable to Pay for Housing in the Last Year: No    Number of Times Moved in the Last Year: 0    Homeless in the Last Year: No  Utilities: Not At Risk (10/17/2024)   Epic    Threatened with loss of utilities: No  Health Literacy: Not on file   Past Medical History:  Past Medical History:  Diagnosis Date   Eye globe prosthesis    GERD (gastroesophageal reflux disease)    History of blood transfusion 1973   related to abscess burst in my stomach   Hyperlipemia    Hyperlipidemia    Hypertension    Osteoarthritis of back    Lowerback    SVD (spontaneous vaginal delivery)    x 1, baby died at 2 wks of age   Type II diabetes mellitus (HCC)     Past Surgical History:  Procedure Laterality Date   APPENDECTOMY     COLONOSCOPY     DILATATION & CURETTAGE/HYSTEROSCOPY WITH MYOSURE N/A 02/25/2015   Procedure: DILATATION & CURETTAGE/HYSTEROSCOPY WITH MYOSURE;  Surgeon: Truman Corona, MD;  Location: [facility];  Service: Gynecology;  Laterality: N/A;   DILATION AND CURETTAGE OF UTERUS     ENUCLEATION Right 03/16/2017   ENUCLEATION Right 03/16/2017   Procedure: ENUCLEATION RIGHT EYE;  Surgeon: Loyd Kathryne Palm, MD;  Location: [facility] OR;  Service: Ophthalmology;  Laterality: Right;   EYE SURGERY     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6, 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   EYE SURGERY Right ~ 1974   S/P initial eye injury; scissors stuck in my eye   LAPAROSCOPIC CHOLECYSTECTOMY     SHOULDER ARTHROSCOPY WITH  ROTATOR CUFF REPAIR Left    wire stitches per patient   SHOULDER ARTHROSCOPY WITH ROTATOR CUFF REPAIR Right     Current Medications: Current Facility-Administered Medications  Medication Dose Route Frequency Provider Last Rate Last Admin   acetaminophen  (TYLENOL ) tablet 650 mg  650 mg Oral Q6H PRN Tex Drilling, NP   650 mg at 11/13/24 2110   alum & mag hydroxide-simeth (MAALOX/MYLANTA) 200-200-20 MG/5ML suspension 30 mL  30 mL Oral Q4H PRN Nkwenti, Doris, NP       cloNIDine  (CATAPRES ) tablet 0.1 mg  0.1 mg Oral BID PRN Tex Drilling, NP       divalproex  (  DEPAKOTE ) DR tablet 1,500 mg  1,500 mg Oral QHS Jazel Nimmons, MD   1,500 mg at 11/13/24 2110   divalproex  (DEPAKOTE ) DR tablet 500 mg  500 mg Oral q AM Casimiro Lienhard, MD   500 mg at 11/14/24 0920   losartan  (COZAAR ) tablet 25 mg  25 mg Oral Daily Nkwenti, Doris, NP   25 mg at 11/14/24 9078   magnesium  hydroxide (MILK OF MAGNESIA) suspension 30 mL  30 mL Oral Daily PRN Tex Drilling, NP       methimazole  (TAPAZOLE ) tablet 10 mg  10 mg Oral Daily Nkwenti, Doris, NP   10 mg at 11/14/24 9079   OLANZapine  (ZYPREXA ) injection 5 mg  5 mg Intramuscular TID PRN Tex Drilling, NP       OLANZapine  (ZYPREXA ) injection 5 mg  5 mg Intramuscular TID PRN Tex Drilling, NP       OLANZapine  (ZYPREXA ) tablet 20 mg  20 mg Oral QHS Terrianne Cavness, MD   20 mg at 11/13/24 2109   OLANZapine  zydis (ZYPREXA ) disintegrating tablet 5 mg  5 mg Oral TID PRN Tex Drilling, NP       OLANZapine  zydis (ZYPREXA ) disintegrating tablet 5 mg  5 mg Oral TID PRN Tex Drilling, NP       pantoprazole  (PROTONIX ) EC tablet 40 mg  40 mg Oral Daily Nkwenti, Doris, NP   40 mg at 11/14/24 0920   traZODone  (DESYREL ) tablet 50 mg  50 mg Oral QHS PRN Tex Drilling, NP   50 mg at 11/13/24 2110    Lab Results:  No results found for this or any previous visit (from the past 48 hours).      Blood Alcohol level:  Lab Results  Component Value Date   [facility] <15 10/14/2024    ETH <10 07/02/2022    Metabolic Disorder Labs: Lab Results  Component Value Date   HGBA1C 5.5 10/14/2024   MPG 111.15 10/14/2024   MPG 137 01/06/2022   Lab Results  Component Value Date   PROLACTIN 5.8 10/14/2024   Lab Results  Component Value Date   CHOL 167 10/14/2024   TRIG 68 10/14/2024   HDL 60 10/14/2024   CHOLHDL 2.8 10/14/2024   VLDL 14 10/14/2024   LDLCALC 93 10/14/2024   LDLCALC 73 03/13/2024    Physical Findings: AIMS:  , ,  ,  ,    CIWA:    COWS:      Psychiatric Specialty Exam:  Presentation  General Appearance:  Appropriate for Environment  Eye Contact: Fleeting  Speech: Normal Rate  Speech Volume: Decreased    Mood and Affect  Mood:fine  Affect: Flat   Thought Process  Thought Processes: impoverished  Orientation:Partial  Thought Content: Chronic delusions Hallucinations: Denies  Ideas of Reference:Delusions; Paranoia  Suicidal Thoughts: Denies  Homicidal Thoughts: Denies   Sensorium  Memory: Immediate Fair; Recent Fair  Judgment: Impaired  Insight: Shallow   Executive Functions  Concentration: Fair  Attention Span: Fair  Recall: Poor  Fund of Knowledge: Fair  Language: Fair   Psychomotor Activity  Psychomotor Activity: No data recorded  Musculoskeletal: Strength & Muscle Tone: within normal limits Gait & Station: normal Assets  Assets: Manufacturing Systems Engineer; Desire for Improvement; Social Support    Physical Exam: Physical Exam Vitals and nursing note reviewed.    ROS Blood pressure (!) 154/126, pulse 88, temperature 97.7 F (36.5 C), resp. rate 16, height 5' 8 (1.727 m), weight 48.3 kg, SpO2 100%. Body mass index is 16.19 kg/m.  Diagnosis: Principal Problem:   Schizophrenia, paranoid (HCC)    Treatment Plan Summary:  Safety and Monitoring:             -- Involuntary admission to inpatient psychiatric unit for safety, stabilization and treatment             -- Daily  contact with patient to assess and evaluate symptoms and progress in treatment             -- Patient's case to be discussed in multi-disciplinary team meeting             -- Observation Level: q15 minute checks             -- Vital signs:  q12 hours             -- Precautions: suicide, elopement, and assault   2. Psychiatric Diagnoses and Treatment:            Depakote  changed to 500 mg every morning and 1500 mg nightly-in next 5 days 11/13/2024 Depakote  level-44 Zyprexa  to 15 mg nightly BMP-within normal limits Occupational Therapy evaluation for assessing appropriate level of care -- The risks/benefits/side-effects/alternatives to this medication were discussed in detail with the patient and time was given for questions. The patient consents to medication trial.                -- Metabolic profile and EKG monitoring obtained while on an atypical antipsychotic (BMI: Lipid Panel: HbgA1c: QTc:)              -- Encouraged patient to participate in unit milieu and in scheduled group therapies  4. Discharge Planning:   -- Social work and case management to assist with discharge planning and identification of hospital follow-up needs prior to discharge  -- Estimated LOS: 3-4 days  Allyn Foil, MD 11/14/2024, 1:22 PM

## 2024-11-14 NOTE — Group Note (Signed)
 Recreation Therapy Group Note   Group Topic:Leisure Education  Group Date: 11/14/2024 Start Time: 1400 End Time: 1440 Facilitators: Celestia Jeoffrey BRAVO, LRT, CTRS Location: Courtyard  Group Description: Music. Patients are encouraged to name their favorite song(s) for LRT to play song through speaker for group to hear, while in the courtyard getting fresh air and sunlight. Patients educated on the definition of leisure and the importance of having different leisure interests outside of the hospital. Group discussed how leisure activities can often be used as pharmacologist and that listening to music and being outside are examples.    Goal Area(s) Addressed:  Patient will identify a current leisure interest.  Patient will practice making a positive decision. Patient will have the opportunity to try a new leisure activity.   Affect/Mood: Appropriate   Participation Level: Active and Engaged   Participation Quality: Independent   Behavior: Calm and Cooperative   Speech/Thought Process: Coherent   Insight: Fair   Judgement: Fair    Modes of Intervention: Education, Guided Discussion, and Music   Patient Response to Interventions:  Receptive   Education Outcome:  Acknowledges education   Clinical Observations/Individualized Feedback: Shari [name] was active in their participation of session activities and group discussion. Pt interacted well with LRT and peers duration of session.   Plan: Continue to engage patient in RT group sessions 2-3x/week.   Jeoffrey BRAVO Celestia, LRT, CTRS 11/14/2024 4:32 PM

## 2024-11-15 DIAGNOSIS — F200 Paranoid schizophrenia: Secondary | ICD-10-CM | POA: Diagnosis not present

## 2024-11-15 LAB — TSH: TSH: <0.1 u[IU]/mL — ABNORMAL LOW (ref 0.350–4.500)

## 2024-11-15 NOTE — Progress Notes (Signed)
 Behavior:  Pleasant and cooperative.   Psych assessment:   Denies SI/HI and AVH.   Interaction / Group attendance:   Present in the milieu.  Minimal interaction with peers and staff.   Medication/ PRNs: Compliant.  PRN medication for pain given as ordered.   Pain: 4/10 in shoulder  15 min checks in place for safety.

## 2024-11-15 NOTE — Group Note (Signed)
 Recreation Therapy Group Note   Group Topic:Leisure Education  Group Date: 11/15/2024 Start Time: 1405 End Time: 1500 Facilitators: Celestia Jeoffrey BRAVO, LRT, CTRS Location: Dayroom  Group Description: Bingo. Patients played multiple rounds of bingo. LRT and pts discussed the definition of leisure, things they do in their free time outside of the hospital, and how bingo is also a leisure activity. Pts received a coloring book, word search book, or journal as a prize.    Goal Area(s) Addressed:  Patient will identify a current leisure interest.  Patient will learn the definition of leisure. Patient will have the opportunity to try a new leisure activity. Patient will communicate with peers and LRT.   Affect/Mood: Appropriate   Participation Level: Non-verbal    Clinical Observations/Individualized Feedback: Maguire was present in the dayroom, however, did not participate.   Plan: Continue to engage patient in RT group sessions 2-3x/week.   Jeoffrey BRAVO Celestia, LRT, CTRS 11/15/2024 4:49 PM

## 2024-11-15 NOTE — Group Note (Signed)
 Date:  11/15/2024 Time:  3:29 PM  Group Topic/Focus:  House rules and expectations for the unit    Participation Level:  Active  Participation Quality:  Appropriate  Affect:  Appropriate  Cognitive:  Appropriate  Insight: Appropriate  Engagement in Group:  Engaged  Modes of Intervention:  Discussion and Education  Additional Comments:  none  [name] 11/15/2024, 3:29 PM

## 2024-11-15 NOTE — Progress Notes (Signed)
 [facility] MD Progress Note  11/15/2024 2:07 PM Shari [name]  MRN:  [id number] Patient is a 68 year old female with a history of Schizoaffective Disorder, Bipolar Type who presents via G[employer] under IVC, initiated by CM, Falencio with re-entry program, to [facility] for assessment. Per IVC, [name] has been diagnosed with Schizophrenia and is refusing to take medication. [name] isn't sleeping. Residents report her screaming and yelling all night long.  [name] responding to internal stimuli.  [name] verbally aggressive.  The respondent has locked all of the residents inside of the transitional housing home.  They could not leave the home which prompted the IVC.  Residents did not have access to food, the restroom or anything as a result of being locked in.  [name] is currently under Adult protective services care. Patient is admitted to [facility] unit with Q15 min safety monitoring. Multidisciplinary team approach is offered. Medication management; group/milieu therapy is offered.   Subjective:  Chart reviewed, case discussed in multidisciplinary meeting, patient seen during rounds.   Patient is noted to be resting in the bed.  She offers no complaints.  Per nursing patient is taking medications with no reported side effects.  [name] not responding to internal stimuli.  [name] maintaining safe behaviors on the unit. Past Psychiatric History: see h&P Family History:  Family History  Problem Relation Age of Onset   Diabetes Mother    CAD Mother    Lung cancer Father    Social History:  Social History   Substance and Sexual Activity  Alcohol Use Yes   Comment: beer occasionally     Social History   Substance and Sexual Activity  Drug Use No    Social History   Socioeconomic History   Marital status: Widowed    Spouse name: Not on file   Number of children: Not on file   Years of education: Not on file   Highest education level: Not on file  Occupational History    Not on file  Tobacco Use   Smoking status: Every Day    Current packs/day: 0.50    Average packs/day: 0.5 packs/day for 40.0 years (20.0 ttl pk-yrs)    Types: Cigarettes   Smokeless tobacco: Never  Vaping Use   Vaping status: Never Used  Substance and Sexual Activity   Alcohol use: Yes    Comment: beer occasionally   Drug use: No   Sexual activity: Not Currently    Birth control/protection: Post-menopausal  Other Topics Concern   Not on file  Social History Narrative   Not on file   Social Drivers of Health   Tobacco Use: High Risk (11/02/2024)   Patient History    Smoking Tobacco Use: Every Day    Smokeless Tobacco Use: Never    Passive Exposure: Not on file  Financial Resource Strain: Not on file  Food Insecurity: No Food Insecurity (10/17/2024)   Epic    Worried About Programme Researcher, Broadcasting/film/video in the Last Year: Never true    Ran Out of Food in the Last Year: Never true  Transportation Needs: No Transportation Needs (10/17/2024)   Epic    Lack of Transportation (Medical): No    Lack of Transportation (Non-Medical): No  Recent Concern: Transportation Needs - Unmet Transportation Needs (10/14/2024)   Epic    Lack of Transportation (Medical): Yes    Lack of Transportation (Non-Medical): Yes  Physical Activity: Not on file  Stress: Not on file  Social Connections: Unknown (10/17/2024)   Social Connection and Isolation Panel    Frequency of Communication with Friends and Family: Patient unable to answer    Frequency of Social Gatherings with Friends and Family: Patient unable to answer    Attends Religious Services: Patient unable to answer    Active Member of Clubs or Organizations: Patient declined    Attends Banker Meetings: Patient unable to answer    Marital Status: Widowed  Depression (PHQ2-9): Not on file  Alcohol Screen: Low Risk (10/17/2024)   Alcohol Screen    Last Alcohol Screening Score (AUDIT): 0  Housing: Low Risk (10/17/2024)   Epic     Unable to Pay for Housing in the Last Year: No    Number of Times Moved in the Last Year: 0    Homeless in the Last Year: No  Utilities: Not At Risk (10/17/2024)   Epic    Threatened with loss of utilities: No  Health Literacy: Not on file   Past Medical History:  Past Medical History:  Diagnosis Date   Eye globe prosthesis    GERD (gastroesophageal reflux disease)    History of blood transfusion 1973   related to abscess burst in my stomach   Hyperlipemia    Hyperlipidemia    Hypertension    Osteoarthritis of back    Lowerback    SVD (spontaneous vaginal delivery)    x 1, baby died at 2 wks of age   Type II diabetes mellitus (HCC)     Past Surgical History:  Procedure Laterality Date   APPENDECTOMY     COLONOSCOPY     DILATATION & CURETTAGE/HYSTEROSCOPY WITH MYOSURE N/A 02/25/2015   Procedure: DILATATION & CURETTAGE/HYSTEROSCOPY WITH MYOSURE;  Surgeon: Truman Corona, MD;  Location: [facility];  Service: Gynecology;  Laterality: N/A;   DILATION AND CURETTAGE OF UTERUS     ENUCLEATION Right 03/16/2017   ENUCLEATION Right 03/16/2017   Procedure: ENUCLEATION RIGHT EYE;  Surgeon: Loyd Kathryne Palm, MD;  Location: [facility] OR;  Service: Ophthalmology;  Laterality: Right;   EYE SURGERY     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6, 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   EYE SURGERY Right ~ 1974   S/P initial eye injury; scissors stuck in my eye   LAPAROSCOPIC CHOLECYSTECTOMY     SHOULDER ARTHROSCOPY WITH ROTATOR CUFF REPAIR Left    wire stitches per patient   SHOULDER ARTHROSCOPY WITH ROTATOR CUFF REPAIR Right     Current Medications: Current Facility-Administered Medications  Medication Dose Route Frequency Provider Last Rate Last Admin   acetaminophen  (TYLENOL ) tablet 650 mg  650 mg Oral Q6H PRN Tex Drilling, NP   650 mg at 11/15/24 0948   alum & mag hydroxide-simeth (MAALOX/MYLANTA) 200-200-20 MG/5ML suspension 30 mL  30 mL Oral Q4H PRN Tex Drilling, NP       cloNIDine  (CATAPRES ) tablet 0.1 mg  0.1 mg Oral BID  PRN Tex Drilling, NP       divalproex  (DEPAKOTE ) DR tablet 1,500 mg  1,500 mg Oral QHS Tyrick Dunagan, MD   1,500 mg at 11/14/24 2126   divalproex  (DEPAKOTE ) DR tablet 500 mg  500 mg Oral q AM Margaux Engen, MD   500 mg at 11/15/24 9057   losartan  (COZAAR ) tablet 25 mg  25 mg Oral Daily Nkwenti, Doris, NP   25 mg at 11/15/24 0943   magnesium  hydroxide (MILK OF MAGNESIA) suspension 30 mL  30 mL Oral Daily PRN Tex Drilling, NP  methimazole  (TAPAZOLE ) tablet 10 mg  10 mg Oral Daily Tex Drilling, NP   10 mg at 11/15/24 9056   OLANZapine  (ZYPREXA ) injection 5 mg  5 mg Intramuscular TID PRN Tex Drilling, NP       OLANZapine  (ZYPREXA ) injection 5 mg  5 mg Intramuscular TID PRN Tex Drilling, NP       OLANZapine  (ZYPREXA ) tablet 20 mg  20 mg Oral QHS Manna Gose, MD   20 mg at 11/14/24 2126   OLANZapine  zydis (ZYPREXA ) disintegrating tablet 5 mg  5 mg Oral TID PRN Tex Drilling, NP       OLANZapine  zydis (ZYPREXA ) disintegrating tablet 5 mg  5 mg Oral TID PRN Tex Drilling, NP       pantoprazole  (PROTONIX ) EC tablet 40 mg  40 mg Oral Daily Nkwenti, Doris, NP   40 mg at 11/15/24 0943   traZODone  (DESYREL ) tablet 50 mg  50 mg Oral QHS PRN Tex Drilling, NP   50 mg at 11/14/24 2126    Lab Results:  No results found for this or any previous visit (from the past 48 hours).      Blood Alcohol level:  Lab Results  Component Value Date   [facility] <15 10/14/2024   ETH <10 07/02/2022    Metabolic Disorder Labs: Lab Results  Component Value Date   HGBA1C 5.5 10/14/2024   MPG 111.15 10/14/2024   MPG 137 01/06/2022   Lab Results  Component Value Date   PROLACTIN 5.8 10/14/2024   Lab Results  Component Value Date   CHOL 167 10/14/2024   TRIG 68 10/14/2024   HDL 60 10/14/2024   CHOLHDL 2.8 10/14/2024   VLDL 14 10/14/2024   LDLCALC 93 10/14/2024   LDLCALC 73 03/13/2024    Physical Findings: AIMS:  , ,  ,  ,    CIWA:    COWS:      Psychiatric Specialty  Exam:  Presentation  General Appearance:  Appropriate for Environment  Eye Contact: Fleeting  Speech: Normal Rate  Speech Volume: Decreased    Mood and Affect  Mood:fine  Affect: Flat   Thought Process  Thought Processes: impoverished  Orientation:Partial  Thought Content: Chronic delusions Hallucinations: Denies  Ideas of Reference:Delusions; Paranoia  Suicidal Thoughts: Denies  Homicidal Thoughts: Denies   Sensorium  Memory: Immediate Fair; Recent Fair  Judgment: Impaired  Insight: Shallow   Executive Functions  Concentration: Fair  Attention Span: Fair  Recall: Poor  Fund of Knowledge: Fair  Language: Fair   Psychomotor Activity  Psychomotor Activity: No data recorded  Musculoskeletal: Strength & Muscle Tone: within normal limits Gait & Station: normal Assets  Assets: Manufacturing Systems Engineer; Desire for Improvement; Social Support    Physical Exam: Physical Exam Vitals and nursing note reviewed.    ROS Blood pressure 122/78, pulse 78, temperature 98.2 F (36.8 C), resp. rate 18, height 5' 8 (1.727 m), weight 48.3 kg, SpO2 98%. Body mass index is 16.19 kg/m.  Diagnosis: Principal Problem:   Schizophrenia, paranoid [facility])    Treatment Plan Summary:  Safety and Monitoring:             -- Involuntary admission to inpatient psychiatric unit for safety, stabilization and treatment             -- Daily contact with patient to assess and evaluate symptoms and progress in treatment             -- Patient's case to be discussed in multi-disciplinary team meeting             --  Observation Level: q15 minute checks             -- Vital signs:  q12 hours             -- Precautions: suicide, elopement, and assault   2. Psychiatric Diagnoses and Treatment:            Depakote  changed to 500 mg every morning and 1500 mg nightly-in next 5 days 11/13/2024 Depakote  level-44 Zyprexa  to 15 mg nightly BMP-within normal  limits Occupational Therapy evaluation for assessing appropriate level of care -- The risks/benefits/side-effects/alternatives to this medication were discussed in detail with the patient and time was given for questions. The patient consents to medication trial.                -- Metabolic profile and EKG monitoring obtained while on an atypical antipsychotic (BMI: Lipid Panel: HbgA1c: QTc:)              -- Encouraged patient to participate in unit milieu and in scheduled group therapies  4. Discharge Planning:   -- Social work and case management to assist with discharge planning and identification of hospital follow-up needs prior to discharge  -- Estimated LOS: 3-4 days  Allyn Foil, MD 11/15/2024, 2:07 PM

## 2024-11-15 NOTE — Group Note (Signed)
 Date:  11/15/2024 Time:  12:30 PM  Group Topic/Focus:  Identifying Needs:   The focus of this group is to help patients identify their personal needs that have been historically problematic and identify healthy behaviors to address their needs.    Participation Level:  Active  Participation Quality:  Appropriate  Affect:  Appropriate  Cognitive:  Appropriate  Insight: Appropriate  Engagement in Group:  Engaged  Modes of Intervention:  Discussion   [name] 11/15/2024, 12:30 PM

## 2024-11-15 NOTE — Progress Notes (Signed)
 SI/HI: denies Behavior/Mood: Pleasant and cooperative. Denies anxiety and depression. No behavior issues noted. Interaction/Group: Min interaction with peers and staff. Attends group.  Medication/PRNs:po med compliant. PRN given for insomnia. Pain: denies Other: slept 9.5 hours   11/14/24 2100  Psych Admission Type (Psych Patients Only)  Admission Status Involuntary  Psychosocial Assessment  Patient Complaints None  Eye Contact Fair  Facial Expression Flat  Affect Appropriate to circumstance  Speech Logical/coherent  Interaction Minimal  Motor Activity Slow  Appearance/Hygiene In scrubs  Behavior Characteristics Appropriate to situation  Mood Pleasant  Thought Process  Coherency WDL  Content WDL  Delusions None reported or observed  Perception WDL  Hallucination None reported or observed  Judgment Impaired  Confusion Mild  Danger to Self  Current suicidal ideation? Denies

## 2024-11-15 NOTE — Group Note (Signed)
 Date:  11/15/2024 Time:  8:42 PM  Group Topic/Focus:  Healthy Communication:   The focus of this group is to discuss communication, barriers to communication, as well as healthy ways to communicate with others.    Participation Level:  Active  Participation Quality:  Appropriate  Affect:  Appropriate  Cognitive:  Appropriate  Insight: Appropriate and Good  Engagement in Group:  Engaged  Modes of Intervention:  Discussion  Additional Comments:    Romero Earnie Hope 11/15/2024, 8:42 PM

## 2024-11-15 NOTE — [facility] Group Notes (Signed)
 Spirituality Group   Description: Participant directed exploration of values, beliefs and meaning   **Focus on Community & Connections: What does community look like to you? How do we know we are connecting?  What are barriers to connecting/relationship with others? Reflect and locate spiritual aspects (eg, purpose, joy, meaning, sense of self) of connection.   **Group named topics of acceptance, forgiveness, and openness to receiving (help, etc.)   Following a brief framework of chaplains role and ground rules of group behavior, participants are invited to share concerns or questions that engage spiritual life. Emphasis placed on common themes and shared experiences and ways to make meaning and clarify living into ones values.   Theory/Process/Goal: Utilize the theoretical framework of group therapy established by Celena Kite, Relational Cultural Theory and Rogerian approaches to facilitate relational empathy and use of the here and now to foster reflection, self-awareness, and sharing.   Observations: Shari [name] was engaged in the group discussion. She contributed more than past groups especially when chaplain helped to encourage and hold space for her to speak.  Shari [name] L. Delores HERO.Div

## 2024-11-15 NOTE — Plan of Care (Signed)
  Problem: Activity: Goal: Interest or engagement in activities will improve Outcome: Progressing Goal: Sleeping patterns will improve Outcome: Progressing   Problem: Coping: Goal: Ability to verbalize frustrations and anger appropriately will improve Outcome: Progressing Goal: Ability to demonstrate self-control will improve Outcome: Progressing   Problem: Safety: Goal: Periods of time without injury will increase Outcome: Progressing

## 2024-11-15 NOTE — Evaluation (Signed)
 Occupational Therapy Evaluation Patient Details Name: Shari [name] MRN: [id number] DOB: 1956/05/20 Today's Date: 11/15/2024   History of Present Illness   68 year old female with a history of Schizoaffective Disorder, Bipolar Type who presents via GPD under IVC. Per IVC, pt refused medications, wasn't sleeping, responding to internal stimuli, verbally aggressive.     Clinical Impressions Pt was seen for OT evaluation this date. Prior to hospital admission, pt was independent with ADL and mobility, uses a cab for transportation, and lives in [facility] with ramp, 4bed/1bath transitional house but later reports the home is hers. She denies difficulty with med mgt and meal prep. Pt presents with deficits in cognition/safety, affecting safe and optimal IADL completion. Pt currently indep with basic ADL and mobility. Pt would benefit from skilled OT services to address independent living skills in order to maximize safety and independence while minimizing future risk of falls, injury, and readmission. Anticipate the need for follow up OT services upon acute hospital DC.    If plan is discharge home, recommend the following:   Direct supervision/assist for medications management;Direct supervision/assist for financial management;Assist for transportation;Assistance with cooking/housework     Functional Status Assessment   Patient has had a recent decline in their functional status and demonstrates the ability to make significant improvements in function in a reasonable and predictable amount of time.     Equipment Recommendations   None recommended by OT     Recommendations for Other Services         Precautions/Restrictions   Precautions Precautions: Fall Recall of Precautions/Restrictions: Intact Restrictions Weight Bearing Restrictions Per Provider Order: No     Mobility Bed Mobility Overal bed mobility: Independent                  Transfers Overall transfer  level: Independent Equipment used: None                      Balance Overall balance assessment: No apparent balance deficits (not formally assessed)                                         ADL either performed or assessed with clinical judgement   ADL Overall ADL's : Independent                                             Vision         Perception         Praxis         Pertinent Vitals/Pain Pain Assessment Pain Assessment: 0-10 Pain Score: 5  Pain Location: R shoulder Pain Descriptors / Indicators: Aching Pain Intervention(s): Monitored during session, Repositioned, Limited activity within patient's tolerance     Extremity/Trunk Assessment Upper Extremity Assessment Upper Extremity Assessment: Overall WFL for tasks assessed   Lower Extremity Assessment Lower Extremity Assessment: Overall WFL for tasks assessed       Communication Communication Communication: No apparent difficulties   Cognition Arousal: Alert Behavior During Therapy: WFL for tasks assessed/performed Cognition: No family/caregiver present to determine baseline             OT - Cognition Comments: Pt alert and oriented x3, unsure why she is here and this bothers her very much. Denies having  a case production designer, theatre/television/film                 Following commands: Intact       Cueing  General Comments   Cueing Techniques: Verbal cues      Exercises Other Exercises Other Exercises: Pt edu in role of acute OT, independent living skills assessment pieces to be completed   Shoulder Instructions      Home Living Family/patient expects to be discharged to:: Other (Comment)                                 Additional Comments: Pt initially reports living in a transitional home but then reports she owns the home and it's not a transitional home.      Prior Functioning/Environment Prior Level of Function : Independent/Modified  Independent             Mobility Comments: indep ADLs Comments: indep with ADL and most IADL; does not drive so takes cabs to stores/appts    OT Problem List: Decreased cognition   OT Treatment/Interventions: Self-care/ADL training;Therapeutic activities;Cognitive remediation/compensation;Patient/family education      OT Goals(Current goals can be found in the care plan section)   Acute Rehab OT Goals Patient Stated Goal: go home and be independent OT Goal Formulation: With patient Time For Goal Achievement: 11/29/24 Potential to Achieve Goals: Good ADL Goals Additional ADL Goal #1: Pt will demo independence with medication mgt using pill box test, 1/1 opportunity. Additional ADL Goal #2: Pt will complete bill mgt task with mod indep, 1/1 opportunity. Additional ADL Goal #3: Pt will complete situational safety assessment with >75% accuracy, 1/1 opportunity.   OT Frequency:  Min 1X/week    Co-evaluation              AM-PAC OT 6 Clicks Daily Activity     Outcome Measure Help from another person eating meals?: None Help from another person taking care of personal grooming?: None Help from another person toileting, which includes using toliet, bedpan, or urinal?: None Help from another person bathing (including washing, rinsing, drying)?: None Help from another person to put on and taking off regular upper body clothing?: None Help from another person to put on and taking off regular lower body clothing?: None 6 Click Score: 24   End of Session    Activity Tolerance: Patient tolerated treatment well Patient left: Other (comment) (in day room)  OT Visit Diagnosis: Other symptoms and signs involving cognitive function                Time: 8652-8596 OT Time Calculation (min): 16 min Charges:  OT General Charges $OT Visit: 1 Visit OT Evaluation $OT Eval Low Complexity: 1 Low  Warren SAUNDERS., MPH, MS, OTR/L ascom [phone] 11/15/2024, 2:47 PM

## 2024-11-15 NOTE — Plan of Care (Signed)
  Problem: Activity: Goal: Interest or engagement in activities will improve Outcome: Progressing   Problem: Coping: Goal: Ability to demonstrate self-control will improve Outcome: Progressing   Problem: Health Behavior/Discharge Planning: Goal: Compliance with treatment plan for underlying cause of condition will improve Outcome: Progressing   

## 2024-11-15 NOTE — Group Note (Signed)
 Physical/Occupational Therapy Group Note  Group Topic: UE Therex   Group Date: 11/15/2024 Start Time: 1300 End Time: 1345 Facilitators: Clive Warren CROME, OT   Group Description: Group instructed in series of upper extremities exercises, aimed to promote strength, flexibility, range of motion and functional endurance.  Patients provided cuing for proper mechanics and proper pace of exercise; exercises adjusted as necessary for individualized patient needs.  Patient also engaged in cognitive components throughout session, working to integrate attention to task, command following, turn-taking and appropriate social interaction throughout session.  Allowed to ask questions as appropriate, and encouraged to identify specific exercises that they could complete independently outside of group sessions.  Therapeutic Goal(s):  Demonstrate appropriate performance of upper extremity exercises to promote strength, flexibility, range of motion and functional endurance Identify 2-3 specific upper extremity exercises to complete as home exercise program outside of group session  Individual Participation: Pt engaged throughout, attentive to instructions but required multimodal cues for >50% of the exercises to improve technique. Engaged moderately in group discussion with prompting.   Participation Level: Active and Engaged   Participation Quality: Moderate Cues   Behavior: Alert, Appropriate, Attentive , Calm, and Cooperative   Speech/Thought Process: Organized and Relevant   Affect/Mood: Appropriate   Insight: Fair   Judgement: Fair   Modes of Intervention: Activity, Clarification, Discussion, Education, Exploration, Problem-solving, Rapport Building, Socialization, and Support  Patient Response to Interventions:  Attentive, Engaged, and Interested    Plan: Continue to engage patient in PT/OT groups 1 - 2x/week.  Adaeze Better R., MPH, MS, OTR/L ascom [phone] 11/15/2024, 4:21 PM

## 2024-11-16 DIAGNOSIS — F200 Paranoid schizophrenia: Secondary | ICD-10-CM | POA: Diagnosis not present

## 2024-11-16 NOTE — Group Note (Signed)
 [facility] LCSW Group Therapy Note   Group Date: 11/16/2024 Start Time: 1601 End Time: 1631   Type of Therapy and Topic: Group Therapy: Avoiding Self-Sabotaging and Enabling Behaviors  Participation Level: Active  Mood: Calm  Description of Group:  In this group, patients will learn how to identify obstacles, self-sabotaging and enabling behaviors, as well as: what are they, why do we do them and what needs these behaviors meet. Discuss unhealthy relationships and how to have positive healthy boundaries with those that sabotage and enable. Explore aspects of self-sabotage and enabling in yourself and how to limit these self-destructive behaviors in everyday life.   Therapeutic Goals: 1. Patient will identify one obstacle that relates to self-sabotage and enabling behaviors 2. Patient will identify one personal self-sabotaging or enabling behavior they did prior to admission 3. Patient will state a plan to change the above identified behavior 4. Patient will demonstrate ability to communicate their needs through discussion and/or role play.    Summary of Patient Progress:  The facilitator and patient discussed self-sabotaging behaviors and their impact on daily life.  Group members chose and shared  strategies for managing it.  The facilitator and patient examined how different experiences can influence their behavior.  The patient reflected on how their behaviors shape their current thoughts and feelings about themselves.  The patient was receptive to feedback from both peers and the facilitator and contributed to creating a supportive environment, encouraging others to open up and shar    Therapeutic Modalities:  Cognitive Behavioral Therapy Person-Centered Therapy Motivational Interviewing    [name], LCSWA

## 2024-11-16 NOTE — Progress Notes (Signed)
 SI/HI: denies  Behavior/Mood: Pt received in bed with eyes open. Calm and cooperative. Denies anxiety and depression. No behavior issues noted.   Interaction/Group: Min interaction with peers and staff. Attends group.  Medications/PRNs: po med compliant. PRN given for insomnia.  Pain: denies  Other: Slept 7.75 hours   11/15/24 2200  Psych Admission Type (Psych Patients Only)  Admission Status Involuntary  Psychosocial Assessment  Patient Complaints None  Eye Contact Fair  Facial Expression Flat  Affect Appropriate to circumstance  Speech Logical/coherent  Interaction Minimal  Motor Activity Slow  Appearance/Hygiene Unremarkable  Behavior Characteristics Appropriate to situation  Mood Pleasant  Thought Process  Coherency WDL  Content WDL  Delusions None reported or observed  Perception WDL  Hallucination None reported or observed  Judgment Impaired  Confusion Mild  Danger to Self  Current suicidal ideation? Denies

## 2024-11-16 NOTE — Progress Notes (Signed)
 [facility] MD Progress Note  11/16/2024 12:45 PM Shari [name]  MRN:  [id number] Patient is a 68 year old female with a history of Schizoaffective Disorder, Bipolar Type who presents via [employer] under IVC, initiated by CM, Falencio with re-entry program, to [facility] for assessment. Per IVC, [name] has been diagnosed with Schizophrenia and is refusing to take medication. [name] isn't sleeping. Residents report her screaming and yelling all night long.  [name] responding to internal stimuli.  [name] verbally aggressive.  The respondent has locked all of the residents inside of the transitional housing home.  They could not leave the home which prompted the IVC.  Residents did not have access to food, the restroom or anything as a result of being locked in.  [name] is currently under Adult protective services care. Patient is admitted to [facility] unit with Q15 min safety monitoring. Multidisciplinary team approach is offered. Medication management; group/milieu therapy is offered.   Subjective:  Chart reviewed, case discussed in multidisciplinary meeting, patient seen during rounds.   Patient is noted to be resting in the bed.  She offers no complaints.  Per nursing patient is taking medications with no reported side effects.  [name] not responding to internal stimuli.  [name] maintaining safe behaviors on the unit.  11/16/2024: Patient is reassessed on the inpatient unit, in bed and does not appear to be in any acute distress. Was sitting in the day room for some time his morning, not engaging with others. She has some questions regarding her medications that make her sleeping more at night and then states that it makes her restless. She continues to deny any of the stated reasons for her admission and she was at her home when she was transported to the hospital by [facility]. However, does admit that she was not taking medications. She does not appear to be responding internal stimuli at  this time. There is concern that she will again not going to continue medications when she is discharged I was not taking medications and believes that there is prostitution going on in the home where she is living. She states now that she is sleeping through the night. There has been no behavioral concerns, she is calm, cooperative and compliant with her treatment plan. Has not required use of PRN medications for behavioral concerns in the past 24 hours. Denies SI, HI, AVH.    Past Psychiatric History: see h&P Family History:  Family History  Problem Relation Age of Onset   Diabetes Mother    CAD Mother    Lung cancer Father    Social History:  Social History   Substance and Sexual Activity  Alcohol Use Yes   Comment: beer occasionally     Social History   Substance and Sexual Activity  Drug Use No    Social History   Socioeconomic History   Marital status: Widowed    Spouse name: Not on file   Number of children: Not on file   Years of education: Not on file   Highest education level: Not on file  Occupational History   Not on file  Tobacco Use   Smoking status: Every Day    Current packs/day: 0.50    Average packs/day: 0.5 packs/day for 40.0 years (20.0 ttl pk-yrs)    Types: Cigarettes   Smokeless tobacco: Never  Vaping Use   Vaping status: Never Used  Substance and Sexual Activity   Alcohol use: Yes  Comment: beer occasionally   Drug use: No   Sexual activity: Not Currently    Birth control/protection: Post-menopausal  Other Topics Concern   Not on file  Social History Narrative   Not on file   Social Drivers of Health   Tobacco Use: High Risk (11/02/2024)   Patient History    Smoking Tobacco Use: Every Day    Smokeless Tobacco Use: Never    Passive Exposure: Not on file  Financial Resource Strain: Not on file  Food Insecurity: No Food Insecurity (10/17/2024)   Epic    Worried About Programme Researcher, Broadcasting/film/video in the Last Year: Never true    Ran Out of  Food in the Last Year: Never true  Transportation Needs: No Transportation Needs (10/17/2024)   Epic    Lack of Transportation (Medical): No    Lack of Transportation (Non-Medical): No  Recent Concern: Transportation Needs - Unmet Transportation Needs (10/14/2024)   Epic    Lack of Transportation (Medical): Yes    Lack of Transportation (Non-Medical): Yes  Physical Activity: Not on file  Stress: Not on file  Social Connections: Unknown (10/17/2024)   Social Connection and Isolation Panel    Frequency of Communication with Friends and Family: Patient unable to answer    Frequency of Social Gatherings with Friends and Family: Patient unable to answer    Attends Religious Services: Patient unable to answer    Active Member of Clubs or Organizations: Patient declined    Attends Banker Meetings: Patient unable to answer    Marital Status: Widowed  Depression (PHQ2-9): Not on file  Alcohol Screen: Low Risk (10/17/2024)   Alcohol Screen    Last Alcohol Screening Score (AUDIT): 0  Housing: Low Risk (10/17/2024)   Epic    Unable to Pay for Housing in the Last Year: No    Number of Times Moved in the Last Year: 0    Homeless in the Last Year: No  Utilities: Not At Risk (10/17/2024)   Epic    Threatened with loss of utilities: No  Health Literacy: Not on file   Past Medical History:  Past Medical History:  Diagnosis Date   Eye globe prosthesis    GERD (gastroesophageal reflux disease)    History of blood transfusion 1973   related to abscess burst in my stomach   Hyperlipemia    Hyperlipidemia    Hypertension    Osteoarthritis of back    Lowerback    SVD (spontaneous vaginal delivery)    x 1, baby died at 2 wks of age   Type II diabetes mellitus (HCC)     Past Surgical History:  Procedure Laterality Date   APPENDECTOMY     COLONOSCOPY     DILATATION & CURETTAGE/HYSTEROSCOPY WITH MYOSURE N/A 02/25/2015   Procedure: DILATATION & CURETTAGE/HYSTEROSCOPY WITH  MYOSURE;  Surgeon: Truman Corona, MD;  Location: [facility];  Service: Gynecology;  Laterality: N/A;   DILATION AND CURETTAGE OF UTERUS     ENUCLEATION Right 03/16/2017   ENUCLEATION Right 03/16/2017   Procedure: ENUCLEATION RIGHT EYE;  Surgeon: Loyd Kathryne Palm, MD;  Location: [facility] OR;  Service: Ophthalmology;  Laterality: Right;   EYE SURGERY     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6, 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   EYE SURGERY Right ~ 1974   S/P initial eye injury; scissors stuck in my eye   LAPAROSCOPIC CHOLECYSTECTOMY     SHOULDER ARTHROSCOPY WITH ROTATOR CUFF REPAIR Left    wire stitches  per patient   SHOULDER ARTHROSCOPY WITH ROTATOR CUFF REPAIR Right     Current Medications: Current Facility-Administered Medications  Medication Dose Route Frequency Provider Last Rate Last Admin   acetaminophen  (TYLENOL ) tablet 650 mg  650 mg Oral Q6H PRN Tex Drilling, NP   650 mg at 11/15/24 0948   alum & mag hydroxide-simeth (MAALOX/MYLANTA) 200-200-20 MG/5ML suspension 30 mL  30 mL Oral Q4H PRN Tex Drilling, NP       cloNIDine  (CATAPRES ) tablet 0.1 mg  0.1 mg Oral BID PRN Tex Drilling, NP       divalproex  (DEPAKOTE ) DR tablet 1,500 mg  1,500 mg Oral QHS Jadapalle, Sree, MD   1,500 mg at 11/15/24 2144   divalproex  (DEPAKOTE ) DR tablet 500 mg  500 mg Oral q AM Jadapalle, Sree, MD   500 mg at 11/16/24 9140   losartan  (COZAAR ) tablet 25 mg  25 mg Oral Daily Tex Drilling, NP   25 mg at 11/16/24 0900   magnesium  hydroxide (MILK OF MAGNESIA) suspension 30 mL  30 mL Oral Daily PRN Tex Drilling, NP       methimazole  (TAPAZOLE ) tablet 10 mg  10 mg Oral Daily Tex Drilling, NP   10 mg at 11/16/24 0900   OLANZapine  (ZYPREXA ) injection 5 mg  5 mg Intramuscular TID PRN Tex Drilling, NP       OLANZapine  (ZYPREXA ) injection 5 mg  5 mg Intramuscular TID PRN Tex Drilling, NP       OLANZapine  (ZYPREXA ) tablet 20 mg  20 mg Oral QHS Jadapalle, Sree, MD   20 mg at 11/15/24 2147   OLANZapine  zydis (ZYPREXA ) disintegrating tablet 5  mg  5 mg Oral TID PRN Tex Drilling, NP       OLANZapine  zydis (ZYPREXA ) disintegrating tablet 5 mg  5 mg Oral TID PRN Tex Drilling, NP       pantoprazole  (PROTONIX ) EC tablet 40 mg  40 mg Oral Daily Nkwenti, Doris, NP   40 mg at 11/16/24 0900   traZODone  (DESYREL ) tablet 50 mg  50 mg Oral QHS PRN Tex Drilling, NP   50 mg at 11/15/24 2146    Lab Results:  Results for orders placed or performed during the hospital encounter of 10/17/24 (from the past 48 hours)  TSH     Status: Abnormal   Collection Time: 11/15/24  1:42 PM  Result Value Ref Range   TSH <0.100 (L) 0.350 - 4.500 uIU/mL    Comment: Performed at [facility], [address] [place], [place] [address]        Blood Alcohol level:  Lab Results  Component Value Date   [facility] <15 10/14/2024   ETH <10 07/02/2022    Metabolic Disorder Labs: Lab Results  Component Value Date   HGBA1C 5.5 10/14/2024   MPG 111.15 10/14/2024   MPG 137 01/06/2022   Lab Results  Component Value Date   PROLACTIN 5.8 10/14/2024   Lab Results  Component Value Date   CHOL 167 10/14/2024   TRIG 68 10/14/2024   HDL 60 10/14/2024   CHOLHDL 2.8 10/14/2024   VLDL 14 10/14/2024   LDLCALC 93 10/14/2024   LDLCALC 73 03/13/2024    Physical Findings: AIMS:  , ,  ,  ,    CIWA:    COWS:      Psychiatric Specialty Exam:  Presentation  General Appearance:  Appropriate for Environment  Eye Contact: Fleeting  Speech: Normal Rate  Speech Volume: Decreased    Mood and Affect  Mood:fine  Affect: Flat   Thought Process  Thought Processes: impoverished  Orientation:Partial  Thought Content: Chronic delusions Hallucinations: Denies  Ideas of Reference:Delusions; Paranoia  Suicidal Thoughts: Denies  Homicidal Thoughts: Denies   Sensorium  Memory: Immediate Fair; Recent Fair  Judgment: Impaired  Insight: Shallow   Executive Functions  Concentration: Fair  Attention  Span: Fair  Recall: Poor  Fund of Knowledge: Fair  Language: Fair   Psychomotor Activity  Psychomotor Activity: No data recorded  Musculoskeletal: Strength & Muscle Tone: within normal limits Gait & Station: normal Assets  Assets: Manufacturing Systems Engineer; Desire for Improvement; Social Support    Physical Exam: Physical Exam Vitals and nursing note reviewed.    ROS Blood pressure 110/60, pulse 82, temperature 98.2 F (36.8 C), resp. rate 17, height 5' 8 (1.727 m), weight 48.3 kg, SpO2 100%. Body mass index is 16.19 kg/m.  Diagnosis: Principal Problem:   Schizophrenia, paranoid (HCC)    Treatment Plan Summary:  Safety and Monitoring:             -- Involuntary admission to inpatient psychiatric unit for safety, stabilization and treatment             -- Daily contact with patient to assess and evaluate symptoms and progress in treatment             -- Patient's case to be discussed in multi-disciplinary team meeting             -- Observation Level: q15 minute checks             -- Vital signs:  q12 hours             -- Precautions: suicide, elopement, and assault   2. Psychiatric Diagnoses and Treatment:            Depakote  changed to 500 mg every morning and 1500 mg nightly-in next 5 days 11/13/2024 Depakote  level-44 Zyprexa  to 15 mg nightly BMP-within normal limits Occupational Therapy evaluation for assessing appropriate level of care -- The risks/benefits/side-effects/alternatives to this medication were discussed in detail with the patient and time was given for questions. The patient consents to medication trial.                -- Metabolic profile and EKG monitoring obtained while on an atypical antipsychotic (BMI: Lipid Panel: HbgA1c: QTc:)              -- Encouraged patient to participate in unit milieu and in scheduled group therapies  4. Discharge Planning:   -- Social work and case management to assist with discharge planning and identification  of hospital follow-up needs prior to discharge  -- Estimated LOS: 3-4 days  Daine KATHEE Ober, NP 11/16/2024, 12:45 PM

## 2024-11-16 NOTE — Plan of Care (Signed)
  Problem: Activity: Goal: Interest or engagement in activities will improve Outcome: Progressing Goal: Sleeping patterns will improve Outcome: Progressing   Problem: Coping: Goal: Ability to verbalize frustrations and anger appropriately will improve Outcome: Progressing Goal: Ability to demonstrate self-control will improve Outcome: Progressing   Problem: Safety: Goal: Periods of time without injury will increase Outcome: Progressing

## 2024-11-16 NOTE — Group Note (Signed)
 Date:  11/16/2024 Time:  8:51 PM  Group Topic/Focus:  Wrap-Up Group:   The focus of this group is to help patients review their daily goal of treatment and discuss progress on daily workbooks.    Participation Level:  Active  Participation Quality:  Appropriate  Affect:  Appropriate  Cognitive:  Appropriate  Insight: Appropriate  Engagement in Group:  Engaged  Modes of Intervention:  Discussion  Additional Comments:    Shari [name] 11/16/2024, 8:51 PM

## 2024-11-16 NOTE — Progress Notes (Signed)
°   11/16/24 0900  Psych Admission Type (Psych Patients Only)  Admission Status Involuntary  Psychosocial Assessment  Patient Complaints None  Eye Contact Fair  Facial Expression Flat  Affect Appropriate to circumstance  Speech Logical/coherent  Interaction Minimal  Motor Activity Slow  Appearance/Hygiene Unremarkable  Behavior Characteristics Cooperative;Calm  Mood Pleasant  Thought Process  Coherency WDL  Content WDL  Delusions None reported or observed  Perception WDL  Hallucination None reported or observed  Judgment Impaired  Confusion Mild  Danger to Self  Current suicidal ideation? Denies  Danger to Others  Danger to Others None reported or observed

## 2024-11-16 NOTE — Plan of Care (Signed)
   Problem: Education: Goal: Knowledge of [facility] Education information/materials will improve Outcome: Progressing Goal: Emotional status will improve Outcome: Progressing Goal: Mental status will improve Outcome: Progressing Goal: Verbalization of understanding the information provided will improve Outcome: Progressing

## 2024-11-16 NOTE — Group Note (Signed)
 Date:  11/16/2024 Time:  4:12 PM  Group Topic/Focus:  Healthy Communication:   The focus of this group is to discuss communication, barriers to communication, as well as healthy ways to communicate with others.    Participation Level:  Active  Participation Quality:  Appropriate  Affect:  Appropriate  Cognitive:  Appropriate  Insight: Appropriate  Engagement in Group:  Engaged  Modes of Intervention:  Socialization  Additional Comments:  N/A  Harlene LITTIE Gavel 11/16/2024, 4:12 PM

## 2024-11-16 NOTE — Group Note (Signed)
 Date:  11/16/2024 Time:  10:14 AM  Group Topic/Focus:  Movement Therapy    Participation Level:  Did Not Attend    Shari [name] 11/16/2024, 10:14 AM

## 2024-11-17 DIAGNOSIS — F200 Paranoid schizophrenia: Secondary | ICD-10-CM | POA: Diagnosis not present

## 2024-11-17 DIAGNOSIS — F0390 Unspecified dementia without behavioral disturbance: Secondary | ICD-10-CM | POA: Diagnosis not present

## 2024-11-17 MED ORDER — MENTHOL 3 MG MT LOZG
1.0000 | LOZENGE | OROMUCOSAL | Status: AC | PRN
  Administered 2024-11-17 – 2025-01-10 (×35): 3 mg via ORAL
  Filled 2024-11-17 (×4): qty 9

## 2024-11-17 NOTE — Progress Notes (Signed)
 SI/HI: denies  Behavior/Mood: Calm and cooperative. Denies anxiety and depression. No behavior issues noted.   Interaction/Group:min interaction with peers and staff. Partial participation group.   Medications/PRNs:po med compliant. PRNs given for insomnia.   Pain: denies  Other: slept 8.75 hours   11/16/24 2100  Psych Admission Type (Psych Patients Only)  Admission Status Involuntary  Psychosocial Assessment  Patient Complaints None  Eye Contact Fair  Facial Expression Flat  Affect Appropriate to circumstance  Speech Logical/coherent  Interaction Minimal  Motor Activity Slow  Appearance/Hygiene Unremarkable  Behavior Characteristics Appropriate to situation  Mood Pleasant  Thought Process  Coherency WDL  Content WDL  Delusions None reported or observed  Perception WDL  Hallucination None reported or observed  Judgment Impaired  Confusion Mild  Danger to Self  Current suicidal ideation? Denies

## 2024-11-17 NOTE — Group Note (Signed)
 LCSW Group Therapy Note  Group Date: 11/17/2024 Start Time: 1300 End Time: 1345   Type of Therapy and Topic:  Group Therapy - Healthy vs Unhealthy Coping Skills  Participation Level:  Did Not Attend   Description of Group The focus of this group was to determine what unhealthy coping techniques typically are used by group members and what healthy coping techniques would be helpful in coping with various problems. Patients were guided in becoming aware of the differences between healthy and unhealthy coping techniques. Patients were asked to identify 2-3 healthy coping skills they would like to learn to use more effectively.  Therapeutic Goals Patients learned that coping is what human beings do all day long to deal with various situations in their lives Patients defined and discussed healthy vs unhealthy coping techniques Patients identified their preferred coping techniques and identified whether these were healthy or unhealthy Patients determined 2-3 healthy coping skills they would like to become more familiar with and use more often. Patients provided support and ideas to each other   Summary of Patient Progress:   Did Not Attend  Therapeutic Modalities Cognitive Behavioral Therapy Motivational Interviewing  Shari [name] M Belinda Bringhurst, LCSWA 11/17/2024  3:47 PM

## 2024-11-17 NOTE — Group Note (Signed)
 Date:  11/17/2024 Time:  10:20 AM  Group Topic/Focus:  Movement Therapy, Morning Stretch with Billyjoe Go.    Participation Level:  Did Not Attend    [name] 11/17/2024, 10:20 AM

## 2024-11-17 NOTE — Group Note (Signed)
 Date:  11/17/2024 Time:  9:56 PM  Group Topic/Focus:  Wrap-Up Group:   The focus of this group is to help patients review their daily goal of treatment and discuss progress on daily workbooks.    Participation Level:  Active  Participation Quality:  Appropriate  Affect:  Appropriate  Cognitive:  Alert  Insight: Appropriate  Engagement in Group:  Engaged  Modes of Intervention:  Discussion  Additional Comments:    [name] [name] 11/17/2024, 9:56 PM

## 2024-11-17 NOTE — Progress Notes (Signed)
 [facility] MD Progress Note  11/17/2024 12:18 PM Shari [name]  MRN:  [id number] Patient is a 68 year old female with a history of Schizoaffective Disorder, Bipolar Type who presents via [facility] under IVC, initiated by CM, Falencio with re-entry program, to [facility] for assessment. Per IVC, [name] has been diagnosed with Schizophrenia and is refusing to take medication. [name] isn't sleeping. Residents report her screaming and yelling all night long.  [name] responding to internal stimuli.  [name] verbally aggressive.  The respondent has locked all of the residents inside of the transitional housing home.  They could not leave the home which prompted the IVC.  Residents did not have access to food, the restroom or anything as a result of being locked in.  [name] is currently under Adult protective services care. Patient is admitted to [facility] unit with Q15 min safety monitoring. Multidisciplinary team approach is offered. Medication management; group/milieu therapy is offered.   Subjective:  Chart reviewed, case discussed in multidisciplinary meeting, patient seen during rounds.   Patient is focused on going home she continues to deny any of the stated reasons for her admission and she was at her home when she was transported to the hospital by [facility]. However, does admit that she was not taking medications. She does not appear to be responding internal stimuli at this time. There is concern that she will again not going to continue medications when she is discharged I was not taking medications and believes that there is prostitution going on in the home where she is living. She states now that she is sleeping through the night. There has been no behavioral concerns, she is calm, cooperative and compliant with her treatment plan. Has not required use of PRN medications for behavioral concerns in the past 24 hours. Denies SI, HI, AVH.    Past Psychiatric History: see h&P Family  History:  Family History  Problem Relation Age of Onset   Diabetes Mother    CAD Mother    Lung cancer Father    Social History:  Social History   Substance and Sexual Activity  Alcohol Use Yes   Comment: beer occasionally     Social History   Substance and Sexual Activity  Drug Use No    Social History   Socioeconomic History   Marital status: Widowed    Spouse name: Not on file   Number of children: Not on file   Years of education: Not on file   Highest education level: Not on file  Occupational History   Not on file  Tobacco Use   Smoking status: Every Day    Current packs/day: 0.50    Average packs/day: 0.5 packs/day for 40.0 years (20.0 ttl pk-yrs)    Types: Cigarettes   Smokeless tobacco: Never  Vaping Use   Vaping status: Never Used  Substance and Sexual Activity   Alcohol use: Yes    Comment: beer occasionally   Drug use: No   Sexual activity: Not Currently    Birth control/protection: Post-menopausal  Other Topics Concern   Not on file  Social History Narrative   Not on file   Social Drivers of Health   Tobacco Use: High Risk (11/02/2024)   Patient History    Smoking Tobacco Use: Every Day    Smokeless Tobacco Use: Never    Passive Exposure: Not on file  Financial Resource Strain: Not on file  Food Insecurity: No Food Insecurity (10/17/2024)   Epic  Worried About Programme Researcher, Broadcasting/film/video in the Last Year: Never true    Ran Out of Food in the Last Year: Never true  Transportation Needs: No Transportation Needs (10/17/2024)   Epic    Lack of Transportation (Medical): No    Lack of Transportation (Non-Medical): No  Recent Concern: Transportation Needs - Unmet Transportation Needs (10/14/2024)   Epic    Lack of Transportation (Medical): Yes    Lack of Transportation (Non-Medical): Yes  Physical Activity: Not on file  Stress: Not on file  Social Connections: Unknown (10/17/2024)   Social Connection and Isolation Panel    Frequency of  Communication with Friends and Family: Patient unable to answer    Frequency of Social Gatherings with Friends and Family: Patient unable to answer    Attends Religious Services: Patient unable to answer    Active Member of Clubs or Organizations: Patient declined    Attends Banker Meetings: Patient unable to answer    Marital Status: Widowed  Depression (PHQ2-9): Not on file  Alcohol Screen: Low Risk (10/17/2024)   Alcohol Screen    Last Alcohol Screening Score (AUDIT): 0  Housing: Low Risk (10/17/2024)   Epic    Unable to Pay for Housing in the Last Year: No    Number of Times Moved in the Last Year: 0    Homeless in the Last Year: No  Utilities: Not At Risk (10/17/2024)   Epic    Threatened with loss of utilities: No  Health Literacy: Not on file   Past Medical History:  Past Medical History:  Diagnosis Date   Eye globe prosthesis    GERD (gastroesophageal reflux disease)    History of blood transfusion 1973   related to abscess burst in my stomach   Hyperlipemia    Hyperlipidemia    Hypertension    Osteoarthritis of back    Lowerback    SVD (spontaneous vaginal delivery)    x 1, baby died at 2 wks of age   Type II diabetes mellitus (HCC)     Past Surgical History:  Procedure Laterality Date   APPENDECTOMY     COLONOSCOPY     DILATATION & CURETTAGE/HYSTEROSCOPY WITH MYOSURE N/A 02/25/2015   Procedure: DILATATION & CURETTAGE/HYSTEROSCOPY WITH MYOSURE;  Surgeon: Truman Corona, MD;  Location: [facility];  Service: Gynecology;  Laterality: N/A;   DILATION AND CURETTAGE OF UTERUS     ENUCLEATION Right 03/16/2017   ENUCLEATION Right 03/16/2017   Procedure: ENUCLEATION RIGHT EYE;  Surgeon: Loyd Kathryne Palm, MD;  Location: [facility] OR;  Service: Ophthalmology;  Laterality: Right;   EYE SURGERY     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6, 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   EYE SURGERY Right ~ 1974   S/P initial eye injury; scissors stuck in my eye   LAPAROSCOPIC CHOLECYSTECTOMY     SHOULDER  ARTHROSCOPY WITH ROTATOR CUFF REPAIR Left    wire stitches per patient   SHOULDER ARTHROSCOPY WITH ROTATOR CUFF REPAIR Right     Current Medications: Current Facility-Administered Medications  Medication Dose Route Frequency Provider Last Rate Last Admin   acetaminophen  (TYLENOL ) tablet 650 mg  650 mg Oral Q6H PRN Tex Drilling, NP   650 mg at 11/15/24 0948   alum & mag hydroxide-simeth (MAALOX/MYLANTA) 200-200-20 MG/5ML suspension 30 mL  30 mL Oral Q4H PRN Nkwenti, Drilling, NP       cloNIDine  (CATAPRES ) tablet 0.1 mg  0.1 mg Oral BID PRN Tex Drilling, NP  divalproex  (DEPAKOTE ) DR tablet 1,500 mg  1,500 mg Oral QHS Donnelly Mellow, MD   1,500 mg at 11/16/24 2138   divalproex  (DEPAKOTE ) DR tablet 500 mg  500 mg Oral q AM Donnelly Mellow, MD   500 mg at 11/17/24 9187   losartan  (COZAAR ) tablet 25 mg  25 mg Oral Daily Tex Drilling, NP   25 mg at 11/17/24 9187   magnesium  hydroxide (MILK OF MAGNESIA) suspension 30 mL  30 mL Oral Daily PRN Tex Drilling, NP       methimazole  (TAPAZOLE ) tablet 10 mg  10 mg Oral Daily Tex Drilling, NP   10 mg at 11/17/24 9188   OLANZapine  (ZYPREXA ) injection 5 mg  5 mg Intramuscular TID PRN Tex Drilling, NP       OLANZapine  (ZYPREXA ) injection 5 mg  5 mg Intramuscular TID PRN Tex Drilling, NP       OLANZapine  (ZYPREXA ) tablet 20 mg  20 mg Oral QHS Jadapalle, Sree, MD   20 mg at 11/16/24 2139   OLANZapine  zydis (ZYPREXA ) disintegrating tablet 5 mg  5 mg Oral TID PRN Tex Drilling, NP       OLANZapine  zydis (ZYPREXA ) disintegrating tablet 5 mg  5 mg Oral TID PRN Tex Drilling, NP       pantoprazole  (PROTONIX ) EC tablet 40 mg  40 mg Oral Daily Tex Drilling, NP   40 mg at 11/17/24 0813   traZODone  (DESYREL ) tablet 50 mg  50 mg Oral QHS PRN Tex Drilling, NP   50 mg at 11/16/24 2139    Lab Results:  Results for orders placed or performed during the hospital encounter of 10/17/24 (from the past 48 hours)  TSH     Status: Abnormal   Collection Time:  11/15/24  1:42 PM  Result Value Ref Range   TSH <0.100 (L) 0.350 - 4.500 uIU/mL    Comment: Performed at [facility], [address] [place], [place] [address]        Blood Alcohol level:  Lab Results  Component Value Date   [facility] <15 10/14/2024   ETH <10 07/02/2022    Metabolic Disorder Labs: Lab Results  Component Value Date   HGBA1C 5.5 10/14/2024   MPG 111.15 10/14/2024   MPG 137 01/06/2022   Lab Results  Component Value Date   PROLACTIN 5.8 10/14/2024   Lab Results  Component Value Date   CHOL 167 10/14/2024   TRIG 68 10/14/2024   HDL 60 10/14/2024   CHOLHDL 2.8 10/14/2024   VLDL 14 10/14/2024   LDLCALC 93 10/14/2024   LDLCALC 73 03/13/2024    Physical Findings: AIMS:  , ,  ,  ,    CIWA:    COWS:      Psychiatric Specialty Exam:  Presentation  General Appearance:  Appropriate for Environment  Eye Contact: Fleeting  Speech: Normal Rate  Speech Volume: Decreased    Mood and Affect  Mood:fine  Affect: Flat   Thought Process  Thought Processes: impoverished  Orientation:Partial  Thought Content: Chronic delusions Hallucinations: Denies  Ideas of Reference:Delusions; Paranoia  Suicidal Thoughts: Denies  Homicidal Thoughts: Denies   Sensorium  Memory: Immediate Fair; Recent Fair  Judgment: Impaired  Insight: Shallow   Executive Functions  Concentration: Fair  Attention Span: Fair  Recall: Poor  Fund of Knowledge: Fair  Language: Fair   Psychomotor Activity  Psychomotor Activity: No data recorded  Musculoskeletal: Strength & Muscle Tone: within normal limits Gait & Station: normal Assets  Assets: Manufacturing Systems Engineer;  Desire for Improvement; Social Support    Physical Exam: Physical Exam Vitals and nursing note reviewed.    ROS Blood pressure 138/77, pulse 92, temperature 97.9 F (36.6 C), resp. rate 18, height 5' 8 (1.727 m), weight 48.3 kg, SpO2 99%. Body mass index is 16.19  kg/m.  Diagnosis: Principal Problem:   Schizophrenia, paranoid (HCC)    Treatment Plan Summary:  Safety and Monitoring:             -- Involuntary admission to inpatient psychiatric unit for safety, stabilization and treatment             -- Daily contact with patient to assess and evaluate symptoms and progress in treatment             -- Patient's case to be discussed in multi-disciplinary team meeting             -- Observation Level: q15 minute checks             -- Vital signs:  q12 hours             -- Precautions: suicide, elopement, and assault   2. Psychiatric Diagnoses and Treatment:            Depakote  changed to 500 mg every morning and 1500 mg nightly-in next 5 days 11/13/2024 Depakote  level-44 Zyprexa  to 15 mg nightly BMP-within normal limits Occupational Therapy evaluation for assessing appropriate level of care -- The risks/benefits/side-effects/alternatives to this medication were discussed in detail with the patient and time was given for questions. The patient consents to medication trial.                -- Metabolic profile and EKG monitoring obtained while on an atypical antipsychotic (BMI: Lipid Panel: HbgA1c: QTc:)              -- Encouraged patient to participate in unit milieu and in scheduled group therapies  4. Discharge Planning:   -- Social work and case management to assist with discharge planning and identification of hospital follow-up needs prior to discharge  -- Estimated LOS: 3-4 days  Millie JONELLE Manners, MD 11/17/2024, 12:18 PM

## 2024-11-17 NOTE — Plan of Care (Signed)
   Problem: Coping: Goal: Ability to verbalize frustrations and anger appropriately will improve Outcome: Progressing Goal: Ability to demonstrate self-control will improve Outcome: Progressing   Problem: Safety: Goal: Periods of time without injury will increase Outcome: Progressing

## 2024-11-17 NOTE — Progress Notes (Signed)
°   11/17/24 0810  Psych Admission Type (Psych Patients Only)  Admission Status Involuntary  Psychosocial Assessment  Patient Complaints None  Eye Contact Fair  Facial Expression Flat  Affect Appropriate to circumstance  Speech Logical/coherent  Interaction Minimal  Motor Activity Slow  Appearance/Hygiene Unremarkable  Behavior Characteristics Appropriate to situation  Mood Pleasant  Aggressive Behavior  Effect No apparent injury  Thought Process  Coherency WDL  Content WDL  Delusions None reported or observed  Perception WDL  Hallucination None reported or observed  Judgment Impaired  Confusion Mild  Danger to Self  Current suicidal ideation? Denies  Description of Agreement Verbal  Danger to Others  Danger to Others None reported or observed

## 2024-11-17 NOTE — Plan of Care (Signed)

## 2024-11-17 NOTE — [facility] Group Notes (Signed)
 [facility] Group Notes:  (Nursing)  Date:  11/17/2024  Time:  3:39 PM  Type of Therapy:  Nurse Education  Participation Level:  Active  Participation Quality:  Appropriate  Affect:  Appropriate  Cognitive:  Appropriate  Insight:  Appropriate  Engagement in Group:  Engaged  Modes of Intervention:  Activity  Summary of Progress/Problems: Patient has full participation for this group.  Shari  [name] 11/17/2024, 3:39 PM

## 2024-11-18 NOTE — Group Note (Signed)
 Recreation Therapy Group Note   Group Topic:Emotion Expression  Group Date: 11/18/2024 Start Time: 1410 End Time: 1500 Facilitators: Celestia Jeoffrey BRAVO, LRT, CTRS Location: Craft Room  Group Description: Positivity Collage. LRT and patients discussed the importance of having a positive mindset and being happy. Patients received magazines, safety scissors, a glue stick and a piece of paper. Pts were encouraged to find images or words in the magazines that showed happiness or positivity to them. Pt shared their collage with the group once they were finished. LRT and pts discussed how it can be difficult to always have a positive mindset, especially when they have mental health challenges.   Goal Area(s) Addressed:  Pt will identify things associate with positivity. Pt will reduce negative thinking. Pt will identify a new coping skill of thinking positive thoughts.    Affect/Mood: Appropriate   Participation Level: Non-verbal    Clinical Observations/Individualized Feedback: Teal was in and out of group. Pt chose not to make a collage.   Plan: Continue to engage patient in RT group sessions 2-3x/week.   Jeoffrey BRAVO Celestia, LRT, CTRS 11/18/2024 4:31 PM

## 2024-11-18 NOTE — Plan of Care (Signed)

## 2024-11-18 NOTE — Group Note (Signed)
 Date:  11/18/2024 Time:  12:09 PM  Group Topic/Focus:   Coping With Mental Health Crisis:   The purpose of this group is to help patients identify strategies for coping with mental health crisis.  Group discusses possible causes of crisis and ways to manage them effectively. Personal Choices and Values:   The focus of this group is to help patients assess and explore the importance of values in their lives, how their values affect their decisions, how they express their values and what opposes their expression.  The patients had the opportunity to create vision boards and set goals for the upcoming new year. We discussed new goals and dreams, along with realistic time frames for accomplishing them. This activity encouraged reflection, motivation, and hope. It also served as a print production planner experience and promoted positivity and a more optimistic outlook on life.    Participation Level:  Did Not Attend  Participation Quality:    Affect:    Cognitive:    Insight:   Engagement in Group:    Modes of Intervention:    Additional Comments:    Francenia Chimenti L Symon Norwood 11/18/2024, 12:09 PM

## 2024-11-18 NOTE — Plan of Care (Signed)
  Problem: Activity: Goal: Interest or engagement in activities will improve Outcome: Progressing   Problem: Coping: Goal: Ability to demonstrate self-control will improve Outcome: Progressing   Problem: Health Behavior/Discharge Planning: Goal: Compliance with treatment plan for underlying cause of condition will improve Outcome: Progressing   

## 2024-11-18 NOTE — Progress Notes (Signed)
 Pt denies SI/HI/AVH this shift. She reported a cough. PRN Lozenges ordered and administered without incident.    11/17/24 2120  Psych Admission Type (Psych Patients Only)  Admission Status Involuntary  Psychosocial Assessment  Patient Complaints None  Eye Contact Fair  Facial Expression Flat  Affect Appropriate to circumstance  Speech Logical/coherent  Interaction Minimal  Motor Activity Slow  Appearance/Hygiene Unremarkable  Behavior Characteristics Appropriate to situation  Mood Pleasant  Thought Process  Coherency WDL  Content WDL  Delusions None reported or observed  Perception WDL  Hallucination None reported or observed  Judgment Impaired  Confusion Mild  Danger to Self  Current suicidal ideation? Denies  Danger to Others  Danger to Others None reported or observed

## 2024-11-18 NOTE — Group Note (Signed)
 Physical/Occupational Therapy Group Note  Group Topic: Yoga  Group Date: 11/18/2024 Start Time: 1500 End Time: 1530 Facilitators: Lillion Elbert, Alm Hamilton, PT   Group Description: Group participated with series of yoga poses, designed to emphasize functional sitting balance, core stability, generalized flexibility and overall posture.  Incorporated deep breathing techniques with poses, working to promote relaxation, mindfulness and focus with targeted activities.  Discussed benefits of yoga in improving mood and self-esteem, reducing stress and anxiety, and promoting functional strength, balance and core stability for each participant.  Discussed ways to integrate into each participants daily routine.  Provided handout with written and pictorial descriptions of included yoga movements to be utilized as appropriate outside of group time.  Therapeutic Goal(s):  Demonstrate safe ability to participate with yoga poses during group activity. Identify one benefit of participation with yoga poses as part of each participants exercise/movement routine. Identify 1-2 individual poses that participant feels most beneficial to his/her needs and that he/she can easily replicate outside of group.  Individual Participation: Pt was quietly observant during the session with occasional participation during the activity portion of the session.   Participation Level: Moderate   Participation Quality: Minimal Cues   Behavior: Appropriate   Speech/Thought Process: Little to no verbal communication during the session   Affect/Mood: Flat   Insight: Fair   Judgement: Fair   Modes of Intervention: Activity and Discussion  Patient Response to Interventions:  Attentive and Receptive   Plan: Continue to engage patient in PT/OT groups 1 - 2x/week.  CHARM Hamilton Bertin PT, DPT 11/18/2024, 5:07 PM

## 2024-11-18 NOTE — Progress Notes (Signed)
 Behavior:  Pleasant and cooperative.     Psych assessment: Denies SI/HI and AVH.  Interaction / Group attendance:  Present in the milieu.  Minimal interaction with peers and staff.  Attends groups.  Medication/ PRNs:  Compliant. PRN medications given for pain and cough as ordered.   Pain: 5/10 shoulder  15 min checks in place for safety.

## 2024-11-18 NOTE — Group Note (Signed)
 Date:  11/18/2024 Time:  9:45 PM  Group Topic/Focus:  Wrap-Up Group:   The focus of this group is to help patients review their daily goal of treatment and discuss progress on daily workbooks.    Participation Level:  Active  Participation Quality:  Appropriate  Affect:  Appropriate  Cognitive:  Alert  Insight: Appropriate  Engagement in Group:  Engaged  Modes of Intervention:  Discussion  Additional Comments:    Shari [name] 11/18/2024, 9:45 PM

## 2024-11-19 DIAGNOSIS — F0390 Unspecified dementia without behavioral disturbance: Secondary | ICD-10-CM | POA: Diagnosis not present

## 2024-11-19 DIAGNOSIS — F200 Paranoid schizophrenia: Secondary | ICD-10-CM | POA: Diagnosis not present

## 2024-11-19 NOTE — Progress Notes (Signed)
 [facility] MD Progress Note  11/19/2024 12:05 PM Shari [name]  MRN:  [id number] Patient is a 68 year old female with a history of Schizoaffective Disorder, Bipolar Type who presents via [facility] under IVC, initiated by CM, Falencio with re-entry program, to [facility] for assessment. Per IVC, [name] has been diagnosed with Schizophrenia and is refusing to take medication. [name] isn't sleeping. Residents report her screaming and yelling all night long.  [name] responding to internal stimuli.  [name] verbally aggressive.  The respondent has locked all of the residents inside of the transitional housing home.  They could not leave the home which prompted the IVC.  Residents did not have access to food, the restroom or anything as a result of being locked in.  [name] is currently under Adult protective services care. Patient is admitted to [facility] unit with Q15 min safety monitoring. Multidisciplinary team approach is offered. Medication management; group/milieu therapy is offered.   Subjective:  Chart reviewed, case discussed in multidisciplinary meeting, patient seen during rounds.   Patient is noted to be resting in her bed.  She expressed her frustration being in the hospital and wants to go back to her house.  She continues to prefer to the transitional house as her own home fully paid for.  Provider informed her that APS is looking into her living situation.  [name] not endorsing SI/HI.  She remains paranoid about the people living in the transitional house.  [name] taking her medications with no reported side effects.   Able to understand medical problem-patient remains paranoid and delusional and has no understanding of the events that led up to her admission including her housing situation which she continues to claim as her own house  Able to understand proposed treatment-patient has no understanding of the treatment options and medications that are proposed to her  continues to claim that she does not require any psychotropics   Able to understand alternative to proposed treatment (if any)-no   Able to understand option of refusing proposed treatment (including withholding or withdrawing proposed treatment)-no   Able to appreciate reasonably foreseeable consequences of accepting proposed treatment-no  After thorough assessment, it is our clinical opinion-  Capacity is not competency. Competency is determined by legal system and judge. Capacity can vary from time to time depending on the mental status of the patient.  Past Psychiatric History: see h&P Family History:  Family History  Problem Relation Age of Onset   Diabetes Mother    CAD Mother    Lung cancer Father    Social History:  Social History   Substance and Sexual Activity  Alcohol Use Yes   Comment: beer occasionally     Social History   Substance and Sexual Activity  Drug Use No    Social History   Socioeconomic History   Marital status: Widowed    Spouse name: Not on file   Number of children: Not on file   Years of education: Not on file   Highest education level: Not on file  Occupational History   Not on file  Tobacco Use   Smoking status: Every Day    Current packs/day: 0.50    Average packs/day: 0.5 packs/day for 40.0 years (20.0 ttl pk-yrs)    Types: Cigarettes   Smokeless tobacco: Never  Vaping Use   Vaping status: Never Used  Substance and Sexual Activity   Alcohol use: Yes    Comment: beer occasionally   Drug  use: No   Sexual activity: Not Currently    Birth control/protection: Post-menopausal  Other Topics Concern   Not on file  Social History Narrative   Not on file   Social Drivers of Health   Tobacco Use: High Risk (11/02/2024)   Patient History    Smoking Tobacco Use: Every Day    Smokeless Tobacco Use: Never    Passive Exposure: Not on file  Financial Resource Strain: Not on file  Food Insecurity: No Food Insecurity (10/17/2024)    Epic    Worried About Programme Researcher, Broadcasting/film/video in the Last Year: Never true    Ran Out of Food in the Last Year: Never true  Transportation Needs: No Transportation Needs (10/17/2024)   Epic    Lack of Transportation (Medical): No    Lack of Transportation (Non-Medical): No  Recent Concern: Transportation Needs - Unmet Transportation Needs (10/14/2024)   Epic    Lack of Transportation (Medical): Yes    Lack of Transportation (Non-Medical): Yes  Physical Activity: Not on file  Stress: Not on file  Social Connections: Unknown (10/17/2024)   Social Connection and Isolation Panel    Frequency of Communication with Friends and Family: Patient unable to answer    Frequency of Social Gatherings with Friends and Family: Patient unable to answer    Attends Religious Services: Patient unable to answer    Active Member of Clubs or Organizations: Patient declined    Attends Banker Meetings: Patient unable to answer    Marital Status: Widowed  Depression (PHQ2-9): Not on file  Alcohol Screen: Low Risk (10/17/2024)   Alcohol Screen    Last Alcohol Screening Score (AUDIT): 0  Housing: Low Risk (10/17/2024)   Epic    Unable to Pay for Housing in the Last Year: No    Number of Times Moved in the Last Year: 0    Homeless in the Last Year: No  Utilities: Not At Risk (10/17/2024)   Epic    Threatened with loss of utilities: No  Health Literacy: Not on file   Past Medical History:  Past Medical History:  Diagnosis Date   Eye globe prosthesis    GERD (gastroesophageal reflux disease)    History of blood transfusion 1973   related to abscess burst in my stomach   Hyperlipemia    Hyperlipidemia    Hypertension    Osteoarthritis of back    Lowerback    SVD (spontaneous vaginal delivery)    x 1, baby died at 2 wks of age   Type II diabetes mellitus (HCC)     Past Surgical History:  Procedure Laterality Date   APPENDECTOMY     COLONOSCOPY     DILATATION & CURETTAGE/HYSTEROSCOPY  WITH MYOSURE N/A 02/25/2015   Procedure: DILATATION & CURETTAGE/HYSTEROSCOPY WITH MYOSURE;  Surgeon: Truman Corona, MD;  Location: [facility];  Service: Gynecology;  Laterality: N/A;   DILATION AND CURETTAGE OF UTERUS     ENUCLEATION Right 03/16/2017   ENUCLEATION Right 03/16/2017   Procedure: ENUCLEATION RIGHT EYE;  Surgeon: Loyd Kathryne Palm, MD;  Location: [facility] OR;  Service: Ophthalmology;  Laterality: Right;   EYE SURGERY     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6, 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   EYE SURGERY Right ~ 1974   S/P initial eye injury; scissors stuck in my eye   LAPAROSCOPIC CHOLECYSTECTOMY     SHOULDER ARTHROSCOPY WITH ROTATOR CUFF REPAIR Left    wire stitches per patient   SHOULDER ARTHROSCOPY  WITH ROTATOR CUFF REPAIR Right     Current Medications: Current Facility-Administered Medications  Medication Dose Route Frequency Provider Last Rate Last Admin   acetaminophen  (TYLENOL ) tablet 650 mg  650 mg Oral Q6H PRN Tex Drilling, NP   650 mg at 11/18/24 2123   alum & mag hydroxide-simeth (MAALOX/MYLANTA) 200-200-20 MG/5ML suspension 30 mL  30 mL Oral Q4H PRN Tex Drilling, NP       cloNIDine  (CATAPRES ) tablet 0.1 mg  0.1 mg Oral BID PRN Tex Drilling, NP       divalproex  (DEPAKOTE ) DR tablet 1,500 mg  1,500 mg Oral QHS Kainalu Heggs, MD   1,500 mg at 11/18/24 2122   divalproex  (DEPAKOTE ) DR tablet 500 mg  500 mg Oral q AM Coriana Angello, MD   500 mg at 11/19/24 9071   losartan  (COZAAR ) tablet 25 mg  25 mg Oral Daily Nkwenti, Doris, NP   25 mg at 11/19/24 9071   magnesium  hydroxide (MILK OF MAGNESIA) suspension 30 mL  30 mL Oral Daily PRN Tex Drilling, NP       menthol  (CEPACOL) lozenge 3 mg  1 lozenge Oral PRN Bobbitt, Shalon E, NP   3 mg at 11/18/24 2141   methimazole  (TAPAZOLE ) tablet 10 mg  10 mg Oral Daily Nkwenti, Doris, NP   10 mg at 11/19/24 9071   OLANZapine  (ZYPREXA ) injection 5 mg  5 mg Intramuscular TID PRN Tex Drilling, NP       OLANZapine  (ZYPREXA ) injection 5 mg  5 mg Intramuscular  TID PRN Tex Drilling, NP       OLANZapine  (ZYPREXA ) tablet 20 mg  20 mg Oral QHS Fawn Desrocher, MD   20 mg at 11/18/24 2122   OLANZapine  zydis (ZYPREXA ) disintegrating tablet 5 mg  5 mg Oral TID PRN Tex Drilling, NP       OLANZapine  zydis (ZYPREXA ) disintegrating tablet 5 mg  5 mg Oral TID PRN Tex Drilling, NP       pantoprazole  (PROTONIX ) EC tablet 40 mg  40 mg Oral Daily Nkwenti, Doris, NP   40 mg at 11/19/24 9071   traZODone  (DESYREL ) tablet 50 mg  50 mg Oral QHS PRN Tex Drilling, NP   50 mg at 11/18/24 2124    Lab Results:  No results found for this or any previous visit (from the past 48 hours).      Blood Alcohol level:  Lab Results  Component Value Date   [facility] <15 10/14/2024   ETH <10 07/02/2022    Metabolic Disorder Labs: Lab Results  Component Value Date   HGBA1C 5.5 10/14/2024   MPG 111.15 10/14/2024   MPG 137 01/06/2022   Lab Results  Component Value Date   PROLACTIN 5.8 10/14/2024   Lab Results  Component Value Date   CHOL 167 10/14/2024   TRIG 68 10/14/2024   HDL 60 10/14/2024   CHOLHDL 2.8 10/14/2024   VLDL 14 10/14/2024   LDLCALC 93 10/14/2024   LDLCALC 73 03/13/2024    Physical Findings: AIMS:  , ,  ,  ,    CIWA:    COWS:      Psychiatric Specialty Exam:  Presentation  General Appearance:  Appropriate for Environment  Eye Contact: Fleeting  Speech: Normal Rate  Speech Volume: Decreased    Mood and Affect  Mood:fine  Affect: Flat   Thought Process  Thought Processes: impoverished  Orientation:Partial  Thought Content: Chronic delusions Hallucinations: Denies  Ideas of Reference:Delusions; Paranoia  Suicidal Thoughts: Denies  Homicidal Thoughts:  Denies   Sensorium  Memory: Immediate Fair; Recent Fair  Judgment: Impaired  Insight: Shallow   Executive Functions  Concentration: Fair  Attention Span: Fair  Recall: Poor  Fund of Knowledge: Fair  Language: Fair   Psychomotor Activity   Psychomotor Activity: No data recorded  Musculoskeletal: Strength & Muscle Tone: within normal limits Gait & Station: normal Assets  Assets: Manufacturing Systems Engineer; Desire for Improvement; Social Support    Physical Exam: Physical Exam Vitals and nursing note reviewed.    ROS Blood pressure 124/73, pulse 81, temperature 98.1 F (36.7 C), resp. rate 17, height 5' 8 (1.727 m), weight 48.3 kg, SpO2 100%. Body mass index is 16.19 kg/m.  Diagnosis: Principal Problem:   Schizophrenia, paranoid [facility])    Treatment Plan Summary: APS referral has been made as patient lacks capacity to make medical decisions.  Recommending legal guardianship and further placement at appropriate level of care  Safety and Monitoring:             -- Involuntary admission to inpatient psychiatric unit for safety, stabilization and treatment             -- Daily contact with patient to assess and evaluate symptoms and progress in treatment             -- Patient's case to be discussed in multi-disciplinary team meeting             -- Observation Level: q15 minute checks             -- Vital signs:  q12 hours             -- Precautions: suicide, elopement, and assault   2. Psychiatric Diagnoses and Treatment:            Depakote  changed to 500 mg every morning and 1500 mg nightly-in next 5 days 11/13/2024 Depakote  level-44 Zyprexa  20 mg nightly BMP-within normal limits Occupational Therapy evaluation for assessing appropriate level of care -- The risks/benefits/side-effects/alternatives to this medication were discussed in detail with the patient and time was given for questions. The patient consents to medication trial.                -- Metabolic profile and EKG monitoring obtained while on an atypical antipsychotic (BMI: Lipid Panel: HbgA1c: QTc:)              -- Encouraged patient to participate in unit milieu and in scheduled group therapies  4. Discharge Planning:   -- Social work and case  management to assist with discharge planning and identification of hospital follow-up needs prior to discharge  -- Estimated LOS: 3-4 days  Allyn Foil, MD 11/19/2024, 12:05 PM

## 2024-11-19 NOTE — Progress Notes (Signed)
 Behavior:   Mild confusion.  Pleasant and cooperative.    Psych assessment:  Denies SI/HI and AVH.  Interaction / Group attendance:  Present in the milieu.  Minimal interaction with peers and staff.  Attends groups.  Medication/ PRNs: Compliant.  Pain:  Denies.  15 min checks in place for safety.

## 2024-11-19 NOTE — Group Note (Signed)
 Recreation Therapy Group Note   Group Topic:General Recreation  Group Date: 11/19/2024 Start Time: 1405 End Time: 1450 Facilitators: Celestia Jeoffrey BRAVO, LRT, CTRS Location: Courtyard  Group Description: [employer]. LRT and patients played games of basketball, drew with chalk, and played corn hole while outside in the courtyard while getting fresh air and sunlight. Music was being played in the background. LRT and peers conversed about different games they have played before, what they do in their free time and anything else that is on their minds. LRT encouraged pts to drink water  after being outside, sweating and getting their heart rate up.  Goal Area(s) Addressed: Patient will build on frustration tolerance skills. Patients will partake in a competitive play game with peers. Patients will gain knowledge of new leisure interest/hobby.    Affect/Mood: Appropriate   Participation Level: Minimal    Clinical Observations/Individualized Feedback: Preciosa was present for most of group. Pt sat away from peers and LRT.   Plan: Continue to engage patient in RT group sessions 2-3x/week.   Jeoffrey BRAVO Celestia, LRT, CTRS 11/19/2024 4:33 PM

## 2024-11-19 NOTE — Group Note (Signed)
 Date:  11/19/2024 Time:  11:37 AM  Group Topic/Focus:  Goals Group:   The focus of this group is to help patients establish daily goals to achieve during treatment and discuss how the patient can incorporate goal setting into their daily lives to aide in recovery. Healthy Communication:   The focus of this group is to discuss communication, barriers to communication, as well as healthy ways to communicate with others.    Participation Level:  Active  Participation Quality:  Appropriate  Affect:  Appropriate  Cognitive:  Appropriate  Insight: Appropriate  Engagement in Group:  Engaged  Modes of Intervention:  Activity and Discussion  Additional Comments:    Sahas Sluka L Clemente Dewey 11/19/2024, 11:37 AM

## 2024-11-19 NOTE — Plan of Care (Signed)

## 2024-11-19 NOTE — Plan of Care (Signed)
  Problem: Education: Goal: Emotional status will improve Outcome: Progressing   Problem: Activity: Goal: Interest or engagement in activities will improve Outcome: Progressing   Problem: Coping: Goal: Ability to demonstrate self-control will improve Outcome: Progressing

## 2024-11-19 NOTE — Progress Notes (Signed)
°   11/18/24 2000  Psych Admission Type (Psych Patients Only)  Admission Status Involuntary  Psychosocial Assessment  Patient Complaints None  Eye Contact Fair  Facial Expression Flat  Affect Appropriate to circumstance  Speech Logical/coherent  Interaction Minimal  Motor Activity Slow  Appearance/Hygiene In scrubs  Behavior Characteristics Cooperative;Appropriate to situation  Mood Pleasant  Thought Process  Coherency WDL  Content WDL  Delusions None reported or observed  Perception WDL  Hallucination None reported or observed  Judgment Impaired  Confusion Mild  Danger to Self  Current suicidal ideation? Denies  Danger to Others  Danger to Others None reported or observed

## 2024-11-19 NOTE — Group Note (Signed)
 Date:  11/19/2024 Time:  11:14 PM  Group Topic/Focus:  Wrap-Up Group:   The focus of this group is to help patients review their daily goal of treatment and discuss progress on daily workbooks.    Participation Level:  Active  Participation Quality:  Appropriate  Affect:  Appropriate  Cognitive:  Alert  Insight: Appropriate  Engagement in Group:  Engaged  Modes of Intervention:  Discussion  Additional Comments:    Shari [name] 11/19/2024, 11:14 PM

## 2024-11-19 NOTE — Group Note (Signed)
 Date:  11/19/2024 Time:  3:58 PM  Group Topic/Focus:  Healthy Communication:   The focus of this group is to discuss communication, barriers to communication, as well as healthy ways to communicate with others. Making Healthy Choices:   The focus of this group is to help patients identify negative/unhealthy choices they were using prior to admission and identify positive/healthier coping strategies to replace them upon discharge.  I provided structured worksheets designed to promote cognitive engagement, social interaction, and conversation. Worksheets included activities that encouraged critical thinking and verbal expression, such as This or That (Would You Rather) questions and prompts focused on favorite memories. The group reviewed the worksheet together, with questions read aloud to support comprehension and inclusion.  In addition to the worksheet activities, the group participated in Guess That Song, which encouraged memory recall, attention, and peer interaction. An acts of kindness discussion was also facilitated, allowing participants to reflect on positive behaviors and share personal experiences.  Participants demonstrated engagement through verbal responses, shared memories, and interaction with peers. The group setting promoted socialization, reminiscence, and positive affect. Modifications were provided as needed, including verbal prompting and allowing responses to be given verbally rather than written.  The session supported cognitive stimulation, emotional expression, and group cohesion in a supportive environment.    Participation Level:  Minimal  Participation Quality:  Appropriate  Affect:  Appropriate  Cognitive:  Appropriate  Insight: Limited  Engagement in Group:  Limited  Modes of Intervention:  Activity and Discussion  Additional Comments:    Taliah Porche L Maizee Reinhold 11/19/2024, 3:58 PM

## 2024-11-19 NOTE — Group Note (Signed)
 LCSW Group Therapy Note   Group Date: 11/19/2024 Start Time: 1315 End Time: 1330   Type of Therapy and Topic:  Group Therapy: Boundaries  Participation Level:  Did Not Attend  Description of Group: This group will address the use of boundaries in their personal lives. Patients will explore why boundaries are important, the difference between healthy and unhealthy boundaries, and negative and postive outcomes of different boundaries and will look at how boundaries can be crossed.  Patients will be encouraged to identify current boundaries in their own lives and identify what kind of boundary is being set. Facilitators will guide patients in utilizing problem-solving interventions to address and correct types boundaries being used and to address when no boundary is being used. Understanding and applying boundaries will be explored and addressed for obtaining and maintaining a balanced life. Patients will be encouraged to explore ways to assertively make their boundaries and needs known to significant others in their lives, using other group members and facilitator for role play, support, and feedback.  Therapeutic Goals:  1.  Patient will identify areas in their life where setting clear boundaries could be  used to improve their life.  2.  Patient will identify signs/triggers that a boundary is not being respected. 3.  Patient will identify two ways to set boundaries in order to achieve balance in  their lives: 4.  Patient will demonstrate ability to communicate their needs and set boundaries  through discussion and/or role plays  Summary of Patient Progress:  x  Therapeutic Modalities:   Cognitive Behavioral Therapy Solution-Focused Therapy  Shari [name], LCSWA 11/19/2024  1:52 PM

## 2024-11-19 NOTE — Progress Notes (Signed)
°   11/19/24 2300  Psych Admission Type (Psych Patients Only)  Admission Status Involuntary  Psychosocial Assessment  Patient Complaints None  Eye Contact Fair  Facial Expression Flat  Affect Appropriate to circumstance  Speech Logical/coherent  Interaction Minimal  Motor Activity Slow  Appearance/Hygiene In scrubs  Behavior Characteristics Cooperative  Mood Pleasant  Thought Process  Coherency WDL  Content WDL  Delusions None reported or observed  Perception WDL  Hallucination None reported or observed  Judgment Impaired  Confusion Mild  Danger to Self  Current suicidal ideation? Denies  Danger to Others  Danger to Others None reported or observed

## 2024-11-20 DIAGNOSIS — F200 Paranoid schizophrenia: Secondary | ICD-10-CM | POA: Diagnosis not present

## 2024-11-20 LAB — VALPROIC ACID LEVEL: Valproic Acid Lvl: 60 ug/mL (ref 50–100)

## 2024-11-20 NOTE — Group Note (Signed)
 Date:  11/20/2024 Time:  10:53 AM  Group Topic/Focus:  Healthy Communication:   The focus of this group is to discuss communication, barriers to communication, as well as healthy ways to communicate with others.    Participation Level:  Active  Participation Quality:  Appropriate  Affect:  Appropriate  Cognitive:  Appropriate  Insight: Appropriate  Engagement in Group:  Limited  Modes of Intervention:  Activity  Additional Comments:  N/A  Harlene LITTIE Gavel 11/20/2024, 10:53 AM

## 2024-11-20 NOTE — Progress Notes (Signed)
 D- Patient alert and oriented x 3. Pt disoriented to situation. Pt states she is not sure why she is on the unit. Pt presents with a pleasant mood and affect. Pt denies SI, HI, AVH, and pain and states her goal is to be discharged home.   A- Scheduled medications administered to patient, per MD orders. Support and encouragement provided.  Routine safety checks conducted every 15 minutes.  Patient informed to notify staff with problems or concerns.  R- No adverse drug reactions noted. Patient contracts for safety at this time. Patient compliant with medications and treatment plan. Patient receptive, calm, and cooperative. Patient interacts well with others on the unit.  Patient remains safe at this time.    Tam Delisle S.,RN

## 2024-11-20 NOTE — Plan of Care (Signed)
  Problem: Activity: Goal: Sleeping patterns will improve Outcome: Progressing   Problem: Coping: Goal: Ability to demonstrate self-control will improve Outcome: Progressing   Problem: Health Behavior/Discharge Planning: Goal: Compliance with treatment plan for underlying cause of condition will improve Outcome: Progressing   Problem: Safety: Goal: Periods of time without injury will increase Outcome: Progressing

## 2024-11-20 NOTE — Plan of Care (Signed)
  Problem: Education: Goal: Emotional status will improve Outcome: Progressing Goal: Mental status will improve Outcome: Progressing   Problem: Activity: Goal: Interest or engagement in activities will improve Outcome: Progressing   Problem: Health Behavior/Discharge Planning: Goal: Compliance with treatment plan for underlying cause of condition will improve Outcome: Progressing   

## 2024-11-20 NOTE — Progress Notes (Addendum)
 [facility] MD Progress Note  11/20/2024 12:06 PM Shari [name]  MRN:  [id number] Patient is a 68 year old female with a history of Schizoaffective Disorder, Bipolar Type who presents via [employer] under IVC, initiated by CM, Falencio with re-entry program, to [facility] for assessment. Per IVC, [name] has been diagnosed with Schizophrenia and is refusing to take medication. [name] isn't sleeping. Residents report her screaming and yelling all night long.  [name] responding to internal stimuli.  [name] verbally aggressive.  The respondent has locked all of the residents inside of the transitional housing home.  They could not leave the home which prompted the IVC.  Residents did not have access to food, the restroom or anything as a result of being locked in.  [name] is currently under Adult protective services care. Patient is admitted to [facility] unit with Q15 min safety monitoring. Multidisciplinary team approach is offered. Medication management; group/milieu therapy is offered.   Subjective:  Chart reviewed, case discussed in multidisciplinary meeting, patient seen during rounds.   Today on interview patient is noted to be resting in bed.  She offers no complaints.  She denies SI/HI/plan and denies hallucinations.  She lacks insight into the events that led up to her hospitalization and continues to demand to be discharged back to the transitional house which she claims to be in her own house.  [name] unable to identify any legal next of kin or family members.  Social work team has updated that APS [name], DSS caseworker, [phone] whi reported that she appeared in court yesterday on behalf of the patient and was granted ex-parte until guardianship is granted       Able to understand medical problem-patient remains paranoid and delusional and has no understanding of the events that led up to her admission including her housing situation which she continues to claim as  her own house  Able to understand proposed treatment-patient has no understanding of the treatment options and medications that are proposed to her continues to claim that she does not require any psychotropics   Able to understand alternative to proposed treatment (if any)-no   Able to understand option of refusing proposed treatment (including withholding or withdrawing proposed treatment)-no   Able to appreciate reasonably foreseeable consequences of accepting proposed treatment-no  After thorough assessment, it is our clinical opinion-  Capacity is not competency. Competency is determined by legal system and judge. Capacity can vary from time to time depending on the mental status of the patient.  Past Psychiatric History: see h&P Family History:  Family History  Problem Relation Age of Onset   Diabetes Mother    CAD Mother    Lung cancer Father    Social History:  Social History   Substance and Sexual Activity  Alcohol Use Yes   Comment: beer occasionally     Social History   Substance and Sexual Activity  Drug Use No    Social History   Socioeconomic History   Marital status: Widowed    Spouse name: Not on file   Number of children: Not on file   Years of education: Not on file   Highest education level: Not on file  Occupational History   Not on file  Tobacco Use   Smoking status: Every Day    Current packs/day: 0.50    Average packs/day: 0.5 packs/day for 40.0 years (20.0 ttl pk-yrs)    Types: Cigarettes   Smokeless tobacco: Never  Vaping  Use   Vaping status: Never Used  Substance and Sexual Activity   Alcohol use: Yes    Comment: beer occasionally   Drug use: No   Sexual activity: Not Currently    Birth control/protection: Post-menopausal  Other Topics Concern   Not on file  Social History Narrative   Not on file   Social Drivers of Health   Tobacco Use: High Risk (11/02/2024)   Patient History    Smoking Tobacco Use: Every Day    Smokeless  Tobacco Use: Never    Passive Exposure: Not on file  Financial Resource Strain: Not on file  Food Insecurity: No Food Insecurity (10/17/2024)   Epic    Worried About Programme Researcher, Broadcasting/film/video in the Last Year: Never true    Ran Out of Food in the Last Year: Never true  Transportation Needs: No Transportation Needs (10/17/2024)   Epic    Lack of Transportation (Medical): No    Lack of Transportation (Non-Medical): No  Recent Concern: Transportation Needs - Unmet Transportation Needs (10/14/2024)   Epic    Lack of Transportation (Medical): Yes    Lack of Transportation (Non-Medical): Yes  Physical Activity: Not on file  Stress: Not on file  Social Connections: Unknown (10/17/2024)   Social Connection and Isolation Panel    Frequency of Communication with Friends and Family: Patient unable to answer    Frequency of Social Gatherings with Friends and Family: Patient unable to answer    Attends Religious Services: Patient unable to answer    Active Member of Clubs or Organizations: Patient declined    Attends Banker Meetings: Patient unable to answer    Marital Status: Widowed  Depression (PHQ2-9): Not on file  Alcohol Screen: Low Risk (10/17/2024)   Alcohol Screen    Last Alcohol Screening Score (AUDIT): 0  Housing: Low Risk (10/17/2024)   Epic    Unable to Pay for Housing in the Last Year: No    Number of Times Moved in the Last Year: 0    Homeless in the Last Year: No  Utilities: Not At Risk (10/17/2024)   Epic    Threatened with loss of utilities: No  Health Literacy: Not on file   Past Medical History:  Past Medical History:  Diagnosis Date   Eye globe prosthesis    GERD (gastroesophageal reflux disease)    History of blood transfusion 1973   related to abscess burst in my stomach   Hyperlipemia    Hyperlipidemia    Hypertension    Osteoarthritis of back    Lowerback    SVD (spontaneous vaginal delivery)    x 1, baby died at 2 wks of age   Type II  diabetes mellitus (HCC)     Past Surgical History:  Procedure Laterality Date   APPENDECTOMY     COLONOSCOPY     DILATATION & CURETTAGE/HYSTEROSCOPY WITH MYOSURE N/A 02/25/2015   Procedure: DILATATION & CURETTAGE/HYSTEROSCOPY WITH MYOSURE;  Surgeon: Truman Corona, MD;  Location: [facility];  Service: Gynecology;  Laterality: N/A;   DILATION AND CURETTAGE OF UTERUS     ENUCLEATION Right 03/16/2017   ENUCLEATION Right 03/16/2017   Procedure: ENUCLEATION RIGHT EYE;  Surgeon: Loyd Kathryne Palm, MD;  Location: [facility] OR;  Service: Ophthalmology;  Laterality: Right;   EYE SURGERY     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6, 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   EYE SURGERY Right ~ 1974   S/P initial eye injury; scissors stuck in my eye  LAPAROSCOPIC CHOLECYSTECTOMY     SHOULDER ARTHROSCOPY WITH ROTATOR CUFF REPAIR Left    wire stitches per patient   SHOULDER ARTHROSCOPY WITH ROTATOR CUFF REPAIR Right     Current Medications: Current Facility-Administered Medications  Medication Dose Route Frequency Provider Last Rate Last Admin   acetaminophen  (TYLENOL ) tablet 650 mg  650 mg Oral Q6H PRN Tex Drilling, NP   650 mg at 11/19/24 2146   alum & mag hydroxide-simeth (MAALOX/MYLANTA) 200-200-20 MG/5ML suspension 30 mL  30 mL Oral Q4H PRN Tex Drilling, NP       cloNIDine  (CATAPRES ) tablet 0.1 mg  0.1 mg Oral BID PRN Tex Drilling, NP       divalproex  (DEPAKOTE ) DR tablet 1,500 mg  1,500 mg Oral QHS Shakeira Rhee, MD   1,500 mg at 11/19/24 2140   divalproex  (DEPAKOTE ) DR tablet 500 mg  500 mg Oral q AM Iniya Matzek, MD   500 mg at 11/20/24 0847   losartan  (COZAAR ) tablet 25 mg  25 mg Oral Daily Tex Drilling, NP   25 mg at 11/20/24 9152   magnesium  hydroxide (MILK OF MAGNESIA) suspension 30 mL  30 mL Oral Daily PRN Tex Drilling, NP       menthol  (CEPACOL) lozenge 3 mg  1 lozenge Oral PRN Bobbitt, Shalon E, NP   3 mg at 11/19/24 2145   methimazole  (TAPAZOLE ) tablet 10 mg  10 mg Oral Daily Tex Drilling, NP   10 mg at 11/20/24  0847   OLANZapine  (ZYPREXA ) injection 5 mg  5 mg Intramuscular TID PRN Tex Drilling, NP       OLANZapine  (ZYPREXA ) injection 5 mg  5 mg Intramuscular TID PRN Tex Drilling, NP       OLANZapine  (ZYPREXA ) tablet 20 mg  20 mg Oral QHS Vlasta Baskin, MD   20 mg at 11/19/24 2139   OLANZapine  zydis (ZYPREXA ) disintegrating tablet 5 mg  5 mg Oral TID PRN Tex Drilling, NP       OLANZapine  zydis (ZYPREXA ) disintegrating tablet 5 mg  5 mg Oral TID PRN Tex Drilling, NP       pantoprazole  (PROTONIX ) EC tablet 40 mg  40 mg Oral Daily Nkwenti, Doris, NP   40 mg at 11/20/24 0847   traZODone  (DESYREL ) tablet 50 mg  50 mg Oral QHS PRN Tex Drilling, NP   50 mg at 11/19/24 2140    Lab Results:  Results for orders placed or performed during the hospital encounter of 10/17/24 (from the past 48 hours)  Valproic acid  level     Status: None   Collection Time: 11/20/24  8:36 AM  Result Value Ref Range   Valproic Acid  Lvl 60 50 - 100 ug/mL    Comment: Performed at [facility], [address] [place], [place] [address]        Blood Alcohol level:  Lab Results  Component Value Date   [facility] <15 10/14/2024   ETH <10 07/02/2022    Metabolic Disorder Labs: Lab Results  Component Value Date   HGBA1C 5.5 10/14/2024   MPG 111.15 10/14/2024   MPG 137 01/06/2022   Lab Results  Component Value Date   PROLACTIN 5.8 10/14/2024   Lab Results  Component Value Date   CHOL 167 10/14/2024   TRIG 68 10/14/2024   HDL 60 10/14/2024   CHOLHDL 2.8 10/14/2024   VLDL 14 10/14/2024   LDLCALC 93 10/14/2024   LDLCALC 73 03/13/2024    Physical Findings: AIMS:  , ,  ,  ,  CIWA:    COWS:      Psychiatric Specialty Exam:  Presentation  General Appearance:  Appropriate for Environment  Eye Contact: Fleeting  Speech: Normal Rate  Speech Volume: Decreased    Mood and Affect  Mood:fine  Affect: Flat   Thought Process  Thought  Processes: impoverished  Orientation:Partial  Thought Content: Chronic delusions Hallucinations: Denies  Ideas of Reference:Delusions; Paranoia  Suicidal Thoughts: Denies  Homicidal Thoughts: Denies   Sensorium  Memory: Immediate Fair; Recent Fair  Judgment: Impaired  Insight: Shallow   Executive Functions  Concentration: Fair  Attention Span: Fair  Recall: Poor  Fund of Knowledge: Fair  Language: Fair   Psychomotor Activity  Psychomotor Activity: No data recorded  Musculoskeletal: Strength & Muscle Tone: within normal limits Gait & Station: normal Assets  Assets: Manufacturing Systems Engineer; Desire for Improvement; Social Support    Physical Exam: Physical Exam Vitals and nursing note reviewed.    ROS Blood pressure (!) 126/56, pulse 95, temperature (!) 97.3 F (36.3 C), resp. rate 16, height 5' 8 (1.727 m), weight 48.3 kg, SpO2 100%. Body mass index is 16.19 kg/m.  Diagnosis: Principal Problem:   Schizophrenia, paranoid [facility])    Treatment Plan Summary: APS referral has been made as patient lacks capacity to make medical decisions.  Recommending legal guardianship and further placement at appropriate level of care  Safety and Monitoring:             -- Involuntary admission to inpatient psychiatric unit for safety, stabilization and treatment             -- Daily contact with patient to assess and evaluate symptoms and progress in treatment             -- Patient's case to be discussed in multi-disciplinary team meeting             -- Observation Level: q15 minute checks             -- Vital signs:  q12 hours             -- Precautions: suicide, elopement, and assault   2. Psychiatric Diagnoses and Treatment:  Check Depakote  levels 11/21/2024           Depakote  changed to 500 mg every morning and 1500 mg nightly-in next 5 days 11/13/2024 Depakote  level-44 Zyprexa  20 mg nightly BMP-within normal limits Occupational Therapy evaluation  for assessing appropriate level of care -- The risks/benefits/side-effects/alternatives to this medication were discussed in detail with the patient and time was given for questions. The patient consents to medication trial.                -- Metabolic profile and EKG monitoring obtained while on an atypical antipsychotic (BMI: Lipid Panel: HbgA1c: QTc:)              -- Encouraged patient to participate in unit milieu and in scheduled group therapies   Occupational Therapy recommendations If plan is discharge home, recommend the following:   Direct supervision/assist for medications management;Direct supervision/assist for financial management;Assist for transportation;Assistance with cooking/housework   4. Discharge Planning:   -- Social work and case management to assist with discharge planning and identification of hospital follow-up needs prior to discharge  -- Estimated LOS: 3-4 days  Allyn Foil, MD 11/20/2024, 12:06 PM

## 2024-11-20 NOTE — Plan of Care (Signed)

## 2024-11-20 NOTE — Progress Notes (Signed)
 Occupational Therapy Treatment Patient Details Name: Shari [name] MRN: [id number] DOB: 1956-11-24 Today's Date: 11/20/2024   History of present illness 68 year old female with a history of Schizoaffective Disorder, Bipolar Type who presents via [employer] under IVC. Per IVC, pt refused medications, wasn't sleeping, responding to internal stimuli, verbally aggressive.   OT comments  Ms Vessell was seen for OT treatment on this date. Upon arrival to room pt seated in dayroom, agreeable to tx. Pt IND for mobility and ADLs including adjusting socks standing in single leg stance. Pt making good progress toward goals, will continue to follow POC. On discharge recommend SUPERVISION for iADLs including cooking and medication mgmt.  Pt completed Pill Box Test with a failing score. The Pill Box test assesses a pt's ability to accurately follow common medication bottle instructions to fill a 1 week pill box. It assesses a pt's ability to plan, self-monitor, volition, and executive function. Pt demonstrated deficits in planning and short term memory. Patient was able to problem solve through all mistakes when prompted and demonstrated evidence of learning during task. Vision also appears to be limiting assessment (pt wearing readers during assessment) as pt accurately identified she was 1 pill short and checked boxes but did not locate the box with extra pills.       If plan is discharge home, recommend the following:  Direct supervision/assist for medications management;Direct supervision/assist for financial management;Assist for transportation;Assistance with cooking/housework   Equipment Recommendations  None recommended by OT (new glasses)    Recommendations for Other Services      Precautions / Restrictions Precautions Precautions: Fall Recall of Precautions/Restrictions: Intact Restrictions Weight Bearing Restrictions Per Provider Order: No       Mobility                     Transfers Overall transfer level: Independent Equipment used: None                     Balance Overall balance assessment: No apparent balance deficits (not formally assessed)                                         ADL either performed or assessed with clinical judgement   ADL Overall ADL's : Independent                                       General ADL Comments: adjusting socks standing single leg stance     Communication Communication Communication: No apparent difficulties   Cognition Arousal: Alert Behavior During Therapy: WFL for tasks assessed/performed Cognition: No family/caregiver present to determine baseline                               Following commands: Intact                      Pertinent Vitals/ Pain       Pain Assessment Pain Assessment: No/denies pain   Frequency  Min 1X/week        Progress Toward Goals  OT Goals(current goals can now be found in the care plan section)  Progress towards OT goals: Progressing toward goals  Acute Rehab OT Goals OT Goal Formulation: With  patient Time For Goal Achievement: 11/29/24 Potential to Achieve Goals: Good ADL Goals Additional ADL Goal #1: Pt will demo independence with medication mgt using pill box test, 1/1 opportunity. Additional ADL Goal #2: Pt will complete bill mgt task with mod indep, 1/1 opportunity. Additional ADL Goal #3: Pt will complete situational safety assessment with >75% accuracy, 1/1 opportunity.  Plan      Co-evaluation                 AM-PAC OT 6 Clicks Daily Activity     Outcome Measure   Help from another person eating meals?: None Help from another person taking care of personal grooming?: None Help from another person toileting, which includes using toliet, bedpan, or urinal?: None Help from another person bathing (including washing, rinsing, drying)?: None Help from another person to put on and  taking off regular upper body clothing?: None Help from another person to put on and taking off regular lower body clothing?: None 6 Click Score: 24    End of Session    OT Visit Diagnosis: Other symptoms and signs involving cognitive function   Activity Tolerance Patient tolerated treatment well   Patient Left in chair;Other (comment) (in day room)   Nurse Communication          Time: [id number] OT Time Calculation (min): 19 min  Charges: OT General Charges $OT Visit: 1 Visit OT Treatments $Therapeutic Activity: 8-22 mins  Elston Slot, M.S. OTR/L  11/20/2024, 12:57 PM  ascom [phone]

## 2024-11-20 NOTE — Progress Notes (Signed)
°   11/20/24 2309  Psych Admission Type (Psych Patients Only)  Admission Status Involuntary  Psychosocial Assessment  Patient Complaints None  Eye Contact Fair  Facial Expression Other (Comment) (WDL)  Affect Appropriate to circumstance  Speech Logical/coherent  Interaction Minimal  Motor Activity Slow  Appearance/Hygiene Unremarkable;In scrubs  Behavior Characteristics Appropriate to situation;Cooperative  Mood Pleasant  Aggressive Behavior  Effect No apparent injury  Thought Process  Coherency WDL  Content WDL  Delusions None reported or observed  Perception WDL  Hallucination None reported or observed  Judgment WDL  Confusion Mild  Danger to Self  Current suicidal ideation? Denies  Description of Suicide Plan No plan  Self-Injurious Behavior No self-injurious ideation or behavior indicators observed or expressed   Agreement Not to Harm Self Yes  Description of Agreement Verbal  Danger to Others  Danger to Others None reported or observed

## 2024-11-20 NOTE — Group Note (Signed)
 Date:  11/20/2024 Time:  8:53 PM  Group Topic/Focus:  Wrap-Up Group:   The focus of this group is to help patients review their daily goal of treatment and discuss progress on daily workbooks.    Participation Level:  Active  Participation Quality:  Appropriate  Affect:  Appropriate  Cognitive:  Alert  Insight: Appropriate  Engagement in Group:  Engaged  Modes of Intervention:  Discussion  Additional Comments:    Sherrilyn JAYSON Redman 11/20/2024, 8:53 PM

## 2024-11-20 NOTE — Progress Notes (Signed)
 Occupational Therapy Treatment Patient Details Name: Shari [name] MRN: [id number] DOB: 1956-07-15 Today's Date: 11/20/2024   History of present illness 68 year old female with a history of Schizoaffective Disorder, Bipolar Type who presents via [employer] under IVC. Per IVC, pt refused medications, wasn't sleeping, responding to internal stimuli, verbally aggressive.   OT comments  Shari [name] was seen for OT treatment on this date. Upon arrival to room pt in dayroom, agreeable to tx. The SLUMS is a 30-point screening questionnaire that tests orientation, memory, attention, problem solving, and executive function. Pt scored a 13/30 indicating Dementia. Of note, it is not within occupational therapy scope of practice to diagnose cognitive impairments, this screen indicates need for further testing. Pt with noted impairments in memory and problem solving limiting ability to participate functionally in medication management, bill management, and safety cooking. Will continue to follow POC. Continue to recommend SUPERVISION for safety with iADLs.   [facility] Mental Status Examination Orientation: 2/3  Calculations: 0/3 Naming animals: 2/3 Patient named 14 animals (0 points is 0-4 animals; 1 is 5-9 animals; 2 is 10-14 animals; 3 is 15+ animals) Recall: 3/5  Attention: 1/2 Clock drawing: 1/4 Visual Processing: 2/2 Paragraph Memory: 2/8  Total: 13/30;  Given that patient has completed high school, this score falls in the dementia range.       If plan is discharge home, recommend the following:  Direct supervision/assist for medications management;Direct supervision/assist for financial management;Assist for transportation;Assistance with cooking/housework   Equipment Recommendations  None recommended by OT (new glasses)    Recommendations for Other Services      Precautions / Restrictions Precautions Precautions: Fall Recall of Precautions/Restrictions: Intact Restrictions Weight  Bearing Restrictions Per Provider Order: No       Mobility Bed Mobility                    Transfers Overall transfer level: Independent Equipment used: None                     Balance Overall balance assessment: No apparent balance deficits (not formally assessed)                                         ADL either performed or assessed with clinical judgement   ADL Overall ADL's : Independent                                       General ADL Comments: IND reaching above head height     Communication Communication Communication: No apparent difficulties   Cognition Arousal: Alert Behavior During Therapy: WFL for tasks assessed/performed Cognition: No family/caregiver present to determine baseline                               Following commands: Intact        Cueing      Exercises Other Exercises Other Exercises: SLUMS 13/30    Shoulder Instructions       General Comments      Pertinent Vitals/ Pain       Pain Assessment Pain Assessment: No/denies pain   Frequency  Min 1X/week        Progress Toward Goals  OT Goals(current goals  can now be found in the care plan section)  Progress towards OT goals: Progressing toward goals  Acute Rehab OT Goals OT Goal Formulation: With patient Time For Goal Achievement: 11/29/24 Potential to Achieve Goals: Good ADL Goals Additional ADL Goal #1: Pt will demo independence with medication mgt using pill box test, 1/1 opportunity. Additional ADL Goal #2: Pt will complete bill mgt task with mod indep, 1/1 opportunity. Additional ADL Goal #3: Pt will complete situational safety assessment with >75% accuracy, 1/1 opportunity.  Plan      Co-evaluation                 AM-PAC OT 6 Clicks Daily Activity     Outcome Measure   Help from another person eating meals?: None Help from another person taking care of personal grooming?: None Help  from another person toileting, which includes using toliet, bedpan, or urinal?: None Help from another person bathing (including washing, rinsing, drying)?: None Help from another person to put on and taking off regular upper body clothing?: None Help from another person to put on and taking off regular lower body clothing?: None 6 Click Score: 24    End of Session    OT Visit Diagnosis: Other symptoms and signs involving cognitive function   Activity Tolerance Patient tolerated treatment well   Patient Left in chair;Other (comment) (in day room)   Nurse Communication          Time: [phone] OT Time Calculation (min): 13 min  Charges: OT General Charges $OT Visit: 1 Visit OT Treatments $Therapeutic Activity: 8-22 mins  Elston Slot, M.S. OTR/L  11/20/2024, 4:10 PM  ascom [phone]

## 2024-11-20 NOTE — Group Note (Signed)
 Date:  11/20/2024 Time:  3:48 PM  Group Topic/Focus:  Emotional Education:   The focus of this group is to discuss what feelings/emotions are, and how they are experienced.    Participation Level:  Active  Participation Quality:  Appropriate  Affect:  Appropriate  Cognitive:  Appropriate  Insight: Appropriate  Engagement in Group:  Engaged  Modes of Intervention:  Discussion 11/20/2024, 3:48 PM

## 2024-11-20 NOTE — BHH Counselor (Signed)
 CSW touched base with [name], DSS caseworker, [phone] who reported that she appeared in court yesterday on behalf of the patient and was granted ex-parte until guardianship is granted.   Ms. Ada reported that while the patient is being referred for a group home, she does not have the Medicaid needed to cover a group home admission.   Arch reported that she will continue to work towards placement.   Nahiem Dredge, MSW, LCSWA 11/20/2024 11:49 AM

## 2024-11-21 DIAGNOSIS — F200 Paranoid schizophrenia: Secondary | ICD-10-CM | POA: Diagnosis not present

## 2024-11-21 DIAGNOSIS — F0390 Unspecified dementia without behavioral disturbance: Secondary | ICD-10-CM | POA: Diagnosis not present

## 2024-11-21 LAB — VALPROIC ACID LEVEL: Valproic Acid Lvl: 44 ug/mL — ABNORMAL LOW (ref 50–100)

## 2024-11-21 NOTE — Group Note (Signed)
 [facility] LCSW Group Therapy Note   Group Date: 11/21/2024 Start Time: 1300 End Time: 1400   Type of Therapy/Topic:  Group Therapy:  Balance in Life  Participation Level:  Did Not Attend   Description of Group:    This group will address the concept of balance and how it feels and looks when one is unbalanced. Patients will be encouraged to process areas in their lives that are out of balance, and identify reasons for remaining unbalanced. Facilitators will guide patients utilizing problem- solving interventions to address and correct the stressor making their life unbalanced. Understanding and applying boundaries will be explored and addressed for obtaining  and maintaining a balanced life. Patients will be encouraged to explore ways to assertively make their unbalanced needs known to significant others in their lives, using other group members and facilitator for support and feedback.  Therapeutic Goals: Patient will identify two or more emotions or situations they have that consume much of in their lives. Patient will identify signs/triggers that life has become out of balance:  Patient will identify two ways to set boundaries in order to achieve balance in their lives:  Patient will demonstrate ability to communicate their needs through discussion and/or role plays  Summary of Patient Progress:    Patient did not attend.     Therapeutic Modalities:   Cognitive Behavioral Therapy Solution-Focused Therapy Assertiveness Training   Alveta CHRISTELLA Kerns, LCSW

## 2024-11-21 NOTE — Progress Notes (Signed)
 [facility] MD Progress Note  11/21/2024 12:27 PM Shari [name]  MRN:  [id number] Patient is a 68 year old female with a history of Schizoaffective Disorder, Bipolar Type who presents via [facility] under IVC, initiated by CM, Falencio with re-entry program, to [facility] for assessment. Per IVC, [name] has been diagnosed with Schizophrenia and is refusing to take medication. [name] isn't sleeping. Residents report her screaming and yelling all night long.  [name] responding to internal stimuli.  [name] verbally aggressive.  The respondent has locked all of the residents inside of the transitional housing home.  They could not leave the home which prompted the IVC.  Residents did not have access to food, the restroom or anything as a result of being locked in.  [name] is currently under Adult protective services care. Patient is admitted to [facility] unit with Q15 min safety monitoring. Multidisciplinary team approach is offered. Medication management; group/milieu therapy is offered.   Subjective:  Chart reviewed, case discussed in multidisciplinary meeting, patient seen during rounds.   Patient is noted to be resting in her bed.  She reports feeling tired but is able to come out of her room and engage in groups.  [name] unable to process the information that she has a emergency guardian at this point.  She remains delusional and continues to pursue that after discharge she is going back home and will be taking care of herself.  [name] not responding to internal stimuli.  She denies SI/HI/plan.  [name] taking her medications.   [name], DSS caseworker, [phone] whi reported that she appeared in court yesterday on behalf of the patient and was granted ex-parte until guardianship is granted       Able to understand medical problem-patient remains paranoid and delusional and has no understanding of the events that led up to her admission including her housing situation which  she continues to claim as her own house  Able to understand proposed treatment-patient has no understanding of the treatment options and medications that are proposed to her continues to claim that she does not require any psychotropics   Able to understand alternative to proposed treatment (if any)-no   Able to understand option of refusing proposed treatment (including withholding or withdrawing proposed treatment)-no   Able to appreciate reasonably foreseeable consequences of accepting proposed treatment-no  After thorough assessment, it is our clinical opinion-  Capacity is not competency. Competency is determined by legal system and judge. Capacity can vary from time to time depending on the mental status of the patient.  Past Psychiatric History: see h&P Family History:  Family History  Problem Relation Age of Onset   Diabetes Mother    CAD Mother    Lung cancer Father    Social History:  Social History   Substance and Sexual Activity  Alcohol Use Yes   Comment: beer occasionally     Social History   Substance and Sexual Activity  Drug Use No    Social History   Socioeconomic History   Marital status: Widowed    Spouse name: Not on file   Number of children: Not on file   Years of education: Not on file   Highest education level: Not on file  Occupational History   Not on file  Tobacco Use   Smoking status: Every Day    Current packs/day: 0.50    Average packs/day: 0.5 packs/day for 40.0 years (20.0 ttl pk-yrs)    Types:  Cigarettes   Smokeless tobacco: Never  Vaping Use   Vaping status: Never Used  Substance and Sexual Activity   Alcohol use: Yes    Comment: beer occasionally   Drug use: No   Sexual activity: Not Currently    Birth control/protection: Post-menopausal  Other Topics Concern   Not on file  Social History Narrative   Not on file   Social Drivers of Health   Tobacco Use: High Risk (11/02/2024)   Patient History    Smoking Tobacco  Use: Every Day    Smokeless Tobacco Use: Never    Passive Exposure: Not on file  Financial Resource Strain: Not on file  Food Insecurity: No Food Insecurity (10/17/2024)   Epic    Worried About Programme Researcher, Broadcasting/film/video in the Last Year: Never true    Ran Out of Food in the Last Year: Never true  Transportation Needs: No Transportation Needs (10/17/2024)   Epic    Lack of Transportation (Medical): No    Lack of Transportation (Non-Medical): No  Recent Concern: Transportation Needs - Unmet Transportation Needs (10/14/2024)   Epic    Lack of Transportation (Medical): Yes    Lack of Transportation (Non-Medical): Yes  Physical Activity: Not on file  Stress: Not on file  Social Connections: Unknown (10/17/2024)   Social Connection and Isolation Panel    Frequency of Communication with Friends and Family: Patient unable to answer    Frequency of Social Gatherings with Friends and Family: Patient unable to answer    Attends Religious Services: Patient unable to answer    Active Member of Clubs or Organizations: Patient declined    Attends Banker Meetings: Patient unable to answer    Marital Status: Widowed  Depression (PHQ2-9): Not on file  Alcohol Screen: Low Risk (10/17/2024)   Alcohol Screen    Last Alcohol Screening Score (AUDIT): 0  Housing: Low Risk (10/17/2024)   Epic    Unable to Pay for Housing in the Last Year: No    Number of Times Moved in the Last Year: 0    Homeless in the Last Year: No  Utilities: Not At Risk (10/17/2024)   Epic    Threatened with loss of utilities: No  Health Literacy: Not on file   Past Medical History:  Past Medical History:  Diagnosis Date   Eye globe prosthesis    GERD (gastroesophageal reflux disease)    History of blood transfusion 1973   related to abscess burst in my stomach   Hyperlipemia    Hyperlipidemia    Hypertension    Osteoarthritis of back    Lowerback    SVD (spontaneous vaginal delivery)    x 1, baby died at 2  wks of age   Type II diabetes mellitus (HCC)     Past Surgical History:  Procedure Laterality Date   APPENDECTOMY     COLONOSCOPY     DILATATION & CURETTAGE/HYSTEROSCOPY WITH MYOSURE N/A 02/25/2015   Procedure: DILATATION & CURETTAGE/HYSTEROSCOPY WITH MYOSURE;  Surgeon: Truman Corona, MD;  Location: [facility];  Service: Gynecology;  Laterality: N/A;   DILATION AND CURETTAGE OF UTERUS     ENUCLEATION Right 03/16/2017   ENUCLEATION Right 03/16/2017   Procedure: ENUCLEATION RIGHT EYE;  Surgeon: Loyd Kathryne Palm, MD;  Location: [facility] OR;  Service: Ophthalmology;  Laterality: Right;   EYE SURGERY     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6, 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   EYE SURGERY Right ~ 1974   S/P  initial eye injury; scissors stuck in my eye   LAPAROSCOPIC CHOLECYSTECTOMY     SHOULDER ARTHROSCOPY WITH ROTATOR CUFF REPAIR Left    wire stitches per patient   SHOULDER ARTHROSCOPY WITH ROTATOR CUFF REPAIR Right     Current Medications: Current Facility-Administered Medications  Medication Dose Route Frequency Provider Last Rate Last Admin   acetaminophen  (TYLENOL ) tablet 650 mg  650 mg Oral Q6H PRN Tex Drilling, NP   650 mg at 11/19/24 2146   alum & mag hydroxide-simeth (MAALOX/MYLANTA) 200-200-20 MG/5ML suspension 30 mL  30 mL Oral Q4H PRN Tex Drilling, NP       cloNIDine  (CATAPRES ) tablet 0.1 mg  0.1 mg Oral BID PRN Tex Drilling, NP       divalproex  (DEPAKOTE ) DR tablet 1,500 mg  1,500 mg Oral QHS Shereta Crothers, MD   1,500 mg at 11/20/24 2126   divalproex  (DEPAKOTE ) DR tablet 500 mg  500 mg Oral q AM Siona Coulston, MD   500 mg at 11/21/24 9140   losartan  (COZAAR ) tablet 25 mg  25 mg Oral Daily Tex Drilling, NP   25 mg at 11/21/24 1027   magnesium  hydroxide (MILK OF MAGNESIA) suspension 30 mL  30 mL Oral Daily PRN Tex Drilling, NP       menthol  (CEPACOL) lozenge 3 mg  1 lozenge Oral PRN Bobbitt, Shalon E, NP   3 mg at 11/20/24 1313   methimazole  (TAPAZOLE ) tablet 10 mg  10 mg Oral Daily Tex Drilling,  NP   10 mg at 11/21/24 1027   OLANZapine  (ZYPREXA ) injection 5 mg  5 mg Intramuscular TID PRN Tex Drilling, NP       OLANZapine  (ZYPREXA ) injection 5 mg  5 mg Intramuscular TID PRN Tex Drilling, NP       OLANZapine  (ZYPREXA ) tablet 20 mg  20 mg Oral QHS Ricki Vanhandel, MD   20 mg at 11/20/24 2125   OLANZapine  zydis (ZYPREXA ) disintegrating tablet 5 mg  5 mg Oral TID PRN Tex Drilling, NP       OLANZapine  zydis (ZYPREXA ) disintegrating tablet 5 mg  5 mg Oral TID PRN Tex Drilling, NP       pantoprazole  (PROTONIX ) EC tablet 40 mg  40 mg Oral Daily Nkwenti, Doris, NP   40 mg at 11/21/24 9076   traZODone  (DESYREL ) tablet 50 mg  50 mg Oral QHS PRN Tex Drilling, NP   50 mg at 11/20/24 2126    Lab Results:  Results for orders placed or performed during the hospital encounter of 10/17/24 (from the past 48 hours)  Valproic acid  level     Status: None   Collection Time: 11/20/24  8:36 AM  Result Value Ref Range   Valproic Acid  Lvl 60 50 - 100 ug/mL    Comment: Performed at [facility], [address] [place], [place] [address]  Valproic acid  level     Status: Abnormal   Collection Time: 11/21/24  8:36 AM  Result Value Ref Range   Valproic Acid  Lvl 44 (L) 50 - 100 ug/mL    Comment: Performed at [facility], [address] [place], [place] [address]        Blood Alcohol level:  Lab Results  Component Value Date   [facility] <15 10/14/2024   ETH <10 07/02/2022    Metabolic Disorder Labs: Lab Results  Component Value Date   HGBA1C 5.5 10/14/2024   MPG 111.15 10/14/2024   MPG 137 01/06/2022   Lab Results  Component Value Date  PROLACTIN 5.8 10/14/2024   Lab Results  Component Value Date   CHOL 167 10/14/2024   TRIG 68 10/14/2024   HDL 60 10/14/2024   CHOLHDL 2.8 10/14/2024   VLDL 14 10/14/2024   LDLCALC 93 10/14/2024   LDLCALC 73 03/13/2024    Physical Findings: AIMS:  , ,  ,  ,    CIWA:    COWS:      Psychiatric Specialty  Exam:  Presentation  General Appearance:  Appropriate for Environment  Eye Contact: Fleeting  Speech: Normal Rate  Speech Volume: Decreased    Mood and Affect  Mood:fine  Affect: Flat   Thought Process  Thought Processes: impoverished  Orientation:Partial  Thought Content: Chronic delusions Hallucinations: Denies  Ideas of Reference:Delusions; Paranoia  Suicidal Thoughts: Denies  Homicidal Thoughts: Denies   Sensorium  Memory: Immediate Fair; Recent Fair  Judgment: Impaired  Insight: Shallow   Executive Functions  Concentration: Fair  Attention Span: Fair  Recall: Poor  Fund of Knowledge: Fair  Language: Fair   Psychomotor Activity  Psychomotor Activity: No data recorded  Musculoskeletal: Strength & Muscle Tone: within normal limits Gait & Station: normal Assets  Assets: Manufacturing Systems Engineer; Desire for Improvement; Social Support    Physical Exam: Physical Exam Vitals and nursing note reviewed.    ROS Blood pressure 138/60, pulse 85, temperature (!) 97.4 F (36.3 C), resp. rate 19, height 5' 8 (1.727 m), weight 48.3 kg, SpO2 100%. Body mass index is 16.19 kg/m.  Diagnosis: Principal Problem:   Schizophrenia, paranoid (HCC) Major neurocognitive disorder-slums total score of 13/30   Treatment Plan Summary: APS referral has been made as patient lacks capacity to make medical decisions.  Recommending legal guardianship and further placement at appropriate level of care  Safety and Monitoring:             -- Involuntary admission to inpatient psychiatric unit for safety, stabilization and treatment             -- Daily contact with patient to assess and evaluate symptoms and progress in treatment             -- Patient's case to be discussed in multi-disciplinary team meeting             -- Observation Level: q15 minute checks             -- Vital signs:  q12 hours             -- Precautions: suicide, elopement, and  assault   2. Psychiatric Diagnoses and Treatment:  Check Depakote  levels 11/21/2024           Depakote  changed to 500 mg every morning and 1500 mg nightly-in next 5 days 11/13/2024 Depakote  level-44 Zyprexa  20 mg nightly BMP-within normal limits Occupational Therapy evaluation for assessing appropriate level of care -- The risks/benefits/side-effects/alternatives to this medication were discussed in detail with the patient and time was given for questions. The patient consents to medication trial.                -- Metabolic profile and EKG monitoring obtained while on an atypical antipsychotic (BMI: Lipid Panel: HbgA1c: QTc:)              -- Encouraged patient to participate in unit milieu and in scheduled group therapies   Occupational Therapy recommendations If plan is discharge home, recommend the following:   Direct supervision/assist for medications management;Direct supervision/assist for financial management;Assist for transportation;Assistance with cooking/housework  Ms Lafavor was seen for OT treatment on this date. Upon arrival to room pt in dayroom, agreeable to tx. The SLUMS is a 30-point screening questionnaire that tests orientation, memory, attention, problem solving, and executive function. Pt scored a 13/30 indicating Dementia. Of note, it is not within occupational therapy scope of practice to diagnose cognitive impairments, this screen indicates need for further testing. Pt with noted impairments in memory and problem solving limiting ability to participate functionally in medication management, bill management, and safety cooking. Will continue to follow POC. Continue to recommend SUPERVISION for safety with iADLs.    [employer] Mental Status Examination Orientation: 2/3  Calculations: 0/3 Naming animals: 2/3 Patient named 14 animals (0 points is 0-4 animals; 1 is 5-9 animals; 2 is 10-14 animals; 3 is 15+ animals) Recall: 3/5  Attention: 1/2 Clock drawing:  1/4 Visual Processing: 2/2 Paragraph Memory: 2/8  Total: 13/30;  Given that patient has completed high school, this score falls in the dementia range.   4. Discharge Planning:   -- Social work and case management to assist with discharge planning and identification of hospital follow-up needs prior to discharge  -- Estimated LOS: 3-4 days  Mauria Asquith, MD 11/21/2024, 12:27 PM

## 2024-11-21 NOTE — Group Note (Signed)
 Date:  11/21/2024 Time:  4:06 PM  Group Topic/Focus:  Overcoming Stress:   The focus of this group is to define stress and help patients assess their triggers.    Participation Level:  Active  Participation Quality:  Appropriate  Affect:  Appropriate  Cognitive:  Appropriate  Insight: Appropriate  Engagement in Group:  Engaged  Modes of Intervention:  Discussion   Shari [name] 11/21/2024, 4:06 PM

## 2024-11-21 NOTE — BHH Group Notes (Signed)
 Spirituality Group   Group Goal: Support / Education around grief and loss   Group Description: Following introductions and group rules, group members engaged in facilitated group dialog and support around topic of loss, with particular support around experiences of loss in their lives. Group members identified types of loss (relationships / self / things) as well as patterns, circumstances, and changes that precipitate loss. Reflection invited on thoughts / feelings around loss, normalized grief responses, and recognized variety in grief experience. Group noted Worden's four tasks of grief in discussion. Group drew on Adlerian / Rogerian, narrative, MI, with Yaloms group therapy as a primary framework.   Observations: Mrs Racine was quiet and reserved but passively engaged in the group discussion; responds positively when invited directly.  Palmer Fahrner L. Delores HERO.Div

## 2024-11-21 NOTE — Group Note (Signed)
 Recreation Therapy Group Note   Group Topic:Emotion Expression  Group Date: 11/21/2024 Start Time: 1400 End Time: 1435 Facilitators: Celestia Jeoffrey BRAVO, LRT, CTRS Location: Dayroom  Group Description: Expressive Higher Education Careers Adviser. Patients received a blank postcard template. LRT encouraged pt to create a postcard to themselves in their younger days with the knowledge and wisdom they have today. Pts are encouraged to share something positive or words of encouragement on their post cards using colored pencils and markers. Once finished, patients and LRT had a discussion on why they chose the words they did and what it means to them.  Goal Area(s) Addressed: Patient will increase communication skills.  Patient will reminisce a fond memory in their life.   Patient will practice healthy decision making. Patient will express their emotions in a positive way.    Affect/Mood: Appropriate and Flat   Participation Level: Minimal    Clinical Observations/Individualized Feedback: Averee was in and out of group. Pt created a postcard, however, did not wish to share.   Plan: Continue to engage patient in RT group sessions 2-3x/week.   Jeoffrey BRAVO Celestia, LRT, CTRS 11/21/2024 5:04 PM

## 2024-11-21 NOTE — Plan of Care (Signed)

## 2024-11-22 DIAGNOSIS — F0390 Unspecified dementia without behavioral disturbance: Secondary | ICD-10-CM | POA: Diagnosis not present

## 2024-11-22 DIAGNOSIS — F200 Paranoid schizophrenia: Secondary | ICD-10-CM | POA: Diagnosis not present

## 2024-11-22 NOTE — Group Note (Signed)
 Date:  11/22/2024 Time:  3:34 PM  Group Topic/Focus:  Movement Therapy, Getting fit with Sophonie Goforth.    Participation Level:  Did Not Attend    Shari [name] Bias 11/22/2024, 3:34 PM

## 2024-11-22 NOTE — Progress Notes (Signed)
" °   11/22/24 1400  Psych Admission Type (Psych Patients Only)  Admission Status Involuntary  Psychosocial Assessment  Patient Complaints None  Eye Contact Fair  Facial Expression Flat  Affect Appropriate to circumstance  Speech Soft  Interaction Minimal;Guarded  Motor Activity Slow  Appearance/Hygiene Unremarkable  Behavior Characteristics Appropriate to situation  Mood Pleasant  Thought Process  Coherency WDL  Content WDL  Delusions None reported or observed  Perception WDL  Hallucination None reported or observed  Judgment WDL  Confusion Mild  Danger to Self  Current suicidal ideation? Denies  Self-Injurious Behavior No self-injurious ideation or behavior indicators observed or expressed   Agreement Not to Harm Self Yes  Description of Agreement verbal    "

## 2024-11-22 NOTE — BHH Counselor (Signed)
 CSW called [name] [phone]) to get an update on placement for pt.   CSW unable to reach, left HIPAA compliant VM requesting return call.   Lum Croft, MSW, [place] 11/22/2024 11:52 AM

## 2024-11-22 NOTE — Group Note (Signed)
 Recreation Therapy Group Note   Group Topic:Communication  Group Date: 11/22/2024 Start Time: 1405 End Time: 1440 Facilitators: Celestia Jeoffrey BRAVO, LRT, CTRS Location: Courtyard  Group Description: Music. Patients are encouraged to name their favorite song(s) for LRT to play song through speaker for group to hear, while in the courtyard getting fresh air and sunlight. Patients educated on the definition of leisure and the importance of having different leisure interests outside of the hospital. Group discussed how leisure activities can often be used as pharmacologist and that listening to music and being outside are examples.    Goal Area(s) Addressed:  Patient will identify a current leisure interest.  Patient will practice making a positive decision. Patient will have the opportunity to try a new leisure activity.   Affect/Mood: Appropriate   Participation Level: Active and Engaged   Participation Quality: Independent   Behavior: Calm and Cooperative   Speech/Thought Process: Coherent   Insight: Good   Judgement: Good   Modes of Intervention: Guided Discussion and Music   Patient Response to Interventions:  Attentive, Engaged, and Receptive   Education Outcome:  Acknowledges education   Clinical Observations/Individualized Feedback: Shannel was active in their participation of session activities and group discussion. Pt identified all types of music as her favorite.    Plan: Continue to engage patient in RT group sessions 2-3x/week.   Jeoffrey BRAVO Celestia, LRT, CTRS 11/22/2024 4:51 PM

## 2024-11-22 NOTE — Plan of Care (Signed)
" °  Problem: Education: Goal: Knowledge of [facility] Education information/materials will improve 11/22/2024 1938 by Geneva Steward CROME, RN Outcome: Progressing 11/22/2024 0623 by Geneva Steward CROME, RN Outcome: Progressing Goal: Emotional status will improve 11/22/2024 1938 by Geneva Steward CROME, RN Outcome: Progressing 11/22/2024 0623 by Geneva Steward CROME, RN Outcome: Progressing Goal: Mental status will improve 11/22/2024 1938 by Geneva Steward CROME, RN Outcome: Progressing 11/22/2024 0623 by Geneva Steward CROME, RN Outcome: Progressing Goal: Verbalization of understanding the information provided will improve 11/22/2024 1938 by Geneva Steward CROME, RN Outcome: Progressing 11/22/2024 0623 by Geneva Steward CROME, RN Outcome: Progressing   Problem: Activity: Goal: Interest or engagement in activities will improve 11/22/2024 1938 by Geneva Steward CROME, RN Outcome: Progressing 11/22/2024 0623 by Geneva Steward CROME, RN Outcome: Progressing Goal: Sleeping patterns will improve 11/22/2024 1938 by Geneva Steward CROME, RN Outcome: Progressing 11/22/2024 0623 by Geneva Steward CROME, RN Outcome: Progressing   Problem: Coping: Goal: Ability to verbalize frustrations and anger appropriately will improve 11/22/2024 1938 by Geneva Steward CROME, RN Outcome: Progressing 11/22/2024 0623 by Geneva Steward CROME, RN Outcome: Progressing Goal: Ability to demonstrate self-control will improve 11/22/2024 1938 by Geneva Steward CROME, RN Outcome: Progressing 11/22/2024 0623 by Geneva Steward CROME, RN Outcome: Progressing   Problem: Health Behavior/Discharge Planning: Goal: Identification of resources available to assist in meeting health care needs will improve 11/22/2024 1938 by Geneva Steward CROME, RN Outcome: Progressing 11/22/2024 0623 by Geneva Steward CROME, RN Outcome: Progressing Goal: Compliance with treatment plan for underlying cause of condition will improve 11/22/2024 1938 by Geneva Steward CROME, RN Outcome: Progressing 11/22/2024  0623 by Geneva Steward CROME, RN Outcome: Progressing   Problem: Physical Regulation: Goal: Ability to maintain clinical measurements within normal limits will improve 11/22/2024 1938 by Geneva Steward CROME, RN Outcome: Progressing 11/22/2024 0623 by Geneva Steward CROME, RN Outcome: Progressing   Problem: Safety: Goal: Periods of time without injury will increase 11/22/2024 1938 by Geneva Steward CROME, RN Outcome: Progressing 11/22/2024 0623 by Geneva Steward CROME, RN Outcome: Progressing   "

## 2024-11-22 NOTE — BHH Counselor (Signed)
 CSW contacted APS caseworker [name] via email to inquire about receiving a copy of the ex-parte that was granted for pt.   Arch reports she will send ex-part to CSW.   CSW awaits ex-parte documentation at this time.   Lum Croft, MSW, [place] 11/22/2024 11:46 AM

## 2024-11-22 NOTE — Plan of Care (Signed)

## 2024-11-22 NOTE — Group Note (Signed)
 Date:  11/22/2024 Time:  10:27 PM  Group Topic/Focus:  Self Care:   The focus of this group is to help patients understand the importance of self-care in order to improve or restore emotional, physical, spiritual, interpersonal, and financial health.    Participation Level:  Active  Participation Quality:  Appropriate  Affect:  Appropriate  Cognitive:  Appropriate  Insight: Appropriate  Engagement in Group:  Engaged  Modes of Intervention:  Education  Additional Comments:    Shari [name] 11/22/2024, 10:27 PM

## 2024-11-22 NOTE — Plan of Care (Signed)
   Problem: Education: Goal: Emotional status will improve Outcome: Progressing Goal: Mental status will improve Outcome: Progressing Goal: Verbalization of understanding the information provided will improve Outcome: Progressing   Problem: Activity: Goal: Interest or engagement in activities will improve Outcome: Progressing

## 2024-11-22 NOTE — Group Note (Signed)
 Date:  11/22/2024 Time:  10:47 AM  Group Topic/Focus:  Stages of Change:   The focus of this group is to explain the stages of change and help patients identify changes they want to make upon discharge.    Participation Level:  Did Not Attend  Participation Quality:     Affect:     Cognitive:     Insight: None  Engagement in Group:     Modes of Intervention:     Additional Comments:    [name] 11/22/2024, 10:47 AM

## 2024-11-22 NOTE — Group Note (Signed)
 Physical/Occupational Therapy Group Note  Group Topic: Pain Management and Coping   Group Date: 11/22/2024 Start Time: 1300 End Time: 1400 Facilitators: Clive Warren CROME, OT   Group Description: Group discussed impact of chronic/acute pain on safety and independence with functional tasks and impact on mental health.  Identified and discussed any previously learned or implemented strategies used.  Discussed and reviewed cognitive behavioral pain coping strategies to address/improve overall management of pain. Discussed relaxation, distraction techniques, cognitive restructuring, activity pacing/energy conservation, environment/home safety modifications, and role of sleep and sleep hygiene. Allowed time for questions and further discussion.  Therapeutic Goal(s):  Identify and discuss previously utilized pain coping strategies and implications of pain on function/well-being Identify and discuss implementing new cognitive behavioral pain coping strategies into daily routines Demonstrate understanding and performance of learned cognitive behavioral pain coping strategies  Individual Participation: Pt did not attend.  Participation Level: Did not attend   Participation Quality:   Behavior:   Speech/Thought Process:   Affect/Mood:   Insight:   Judgement:   Modes of Intervention:   Patient Response to Interventions:    Plan: Continue to engage patient in PT/OT groups 1 - 2x/week.  Keagan Anthis R., MPH, MS, OTR/L ascom [phone] 11/22/2024, 3:15 PM

## 2024-11-22 NOTE — Progress Notes (Signed)
 [facility] MD Progress Note  11/22/2024 12:10 PM Shari [name]  MRN:  [id number] Patient is a 68 year old female with a history of Schizoaffective Disorder, Bipolar Type who presents via [facility] under IVC, initiated by CM, Falencio with re-entry program, to [facility] for assessment. Per IVC, [name] has been diagnosed with Schizophrenia and is refusing to take medication. [name] isn't sleeping. Residents report her screaming and yelling all night long.  [name] responding to internal stimuli.  [name] verbally aggressive.  The respondent has locked all of the residents inside of the transitional housing home.  They could not leave the home which prompted the IVC.  Residents did not have access to food, the restroom or anything as a result of being locked in.  [name] is currently under Adult protective services care. Patient is admitted to [facility] unit with Q15 min safety monitoring. Multidisciplinary team approach is offered. Medication management; group/milieu therapy is offered.   Subjective:  Chart reviewed, case discussed in multidisciplinary meeting, patient seen during rounds.    Patient is noted to be resting in bed.  She offers no complaints.  She denies having any physical complaints.  She remains discharge focused and wants to now her discharge planning.  She was informed about social services working on her case but patient is not really processing the concept of guardianship and placement.  She denies SI/HI/plan and denies hallucinations.  Per nursing report patient is attending groups and taking her medicine with no problems.  [name], DSS caseworker, [phone] whi reported that she appeared in court yesterday on behalf of the patient and was granted ex-parte until guardianship is granted       Able to understand medical problem-patient remains paranoid and delusional and has no understanding of the events that led up to her admission including her housing  situation which she continues to claim as her own house  Able to understand proposed treatment-patient has no understanding of the treatment options and medications that are proposed to her continues to claim that she does not require any psychotropics   Able to understand alternative to proposed treatment (if any)-no   Able to understand option of refusing proposed treatment (including withholding or withdrawing proposed treatment)-no   Able to appreciate reasonably foreseeable consequences of accepting proposed treatment-no  After thorough assessment, it is our clinical opinion-  Capacity is not competency. Competency is determined by legal system and judge. Capacity can vary from time to time depending on the mental status of the patient.  Past Psychiatric History: see h&P Family History:  Family History  Problem Relation Age of Onset   Diabetes Mother    CAD Mother    Lung cancer Father    Social History:  Social History   Substance and Sexual Activity  Alcohol Use Yes   Comment: beer occasionally     Social History   Substance and Sexual Activity  Drug Use No    Social History   Socioeconomic History   Marital status: Widowed    Spouse name: Not on file   Number of children: Not on file   Years of education: Not on file   Highest education level: Not on file  Occupational History   Not on file  Tobacco Use   Smoking status: Every Day    Current packs/day: 0.50    Average packs/day: 0.5 packs/day for 40.0 years (20.0 ttl pk-yrs)    Types: Cigarettes   Smokeless tobacco: Never  Vaping  Use   Vaping status: Never Used  Substance and Sexual Activity   Alcohol use: Yes    Comment: beer occasionally   Drug use: No   Sexual activity: Not Currently    Birth control/protection: Post-menopausal  Other Topics Concern   Not on file  Social History Narrative   Not on file   Social Drivers of Health   Tobacco Use: High Risk (11/02/2024)   Patient History     Smoking Tobacco Use: Every Day    Smokeless Tobacco Use: Never    Passive Exposure: Not on file  Financial Resource Strain: Not on file  Food Insecurity: No Food Insecurity (10/17/2024)   Epic    Worried About Programme Researcher, Broadcasting/film/video in the Last Year: Never true    Ran Out of Food in the Last Year: Never true  Transportation Needs: No Transportation Needs (10/17/2024)   Epic    Lack of Transportation (Medical): No    Lack of Transportation (Non-Medical): No  Recent Concern: Transportation Needs - Unmet Transportation Needs (10/14/2024)   Epic    Lack of Transportation (Medical): Yes    Lack of Transportation (Non-Medical): Yes  Physical Activity: Not on file  Stress: Not on file  Social Connections: Unknown (10/17/2024)   Social Connection and Isolation Panel    Frequency of Communication with Friends and Family: Patient unable to answer    Frequency of Social Gatherings with Friends and Family: Patient unable to answer    Attends Religious Services: Patient unable to answer    Active Member of Clubs or Organizations: Patient declined    Attends Banker Meetings: Patient unable to answer    Marital Status: Widowed  Depression (PHQ2-9): Not on file  Alcohol Screen: Low Risk (10/17/2024)   Alcohol Screen    Last Alcohol Screening Score (AUDIT): 0  Housing: Low Risk (10/17/2024)   Epic    Unable to Pay for Housing in the Last Year: No    Number of Times Moved in the Last Year: 0    Homeless in the Last Year: No  Utilities: Not At Risk (10/17/2024)   Epic    Threatened with loss of utilities: No  Health Literacy: Not on file   Past Medical History:  Past Medical History:  Diagnosis Date   Eye globe prosthesis    GERD (gastroesophageal reflux disease)    History of blood transfusion 1973   related to abscess burst in my stomach   Hyperlipemia    Hyperlipidemia    Hypertension    Osteoarthritis of back    Lowerback    SVD (spontaneous vaginal delivery)    x  1, baby died at 2 wks of age   Type II diabetes mellitus (HCC)     Past Surgical History:  Procedure Laterality Date   APPENDECTOMY     COLONOSCOPY     DILATATION & CURETTAGE/HYSTEROSCOPY WITH MYOSURE N/A 02/25/2015   Procedure: DILATATION & CURETTAGE/HYSTEROSCOPY WITH MYOSURE;  Surgeon: Truman Corona, MD;  Location: [facility];  Service: Gynecology;  Laterality: N/A;   DILATION AND CURETTAGE OF UTERUS     ENUCLEATION Right 03/16/2017   ENUCLEATION Right 03/16/2017   Procedure: ENUCLEATION RIGHT EYE;  Surgeon: Loyd Kathryne Palm, MD;  Location: [facility] OR;  Service: Ophthalmology;  Laterality: Right;   EYE SURGERY     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6, 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   EYE SURGERY Right ~ 1974   S/P initial eye injury; scissors stuck in my eye  LAPAROSCOPIC CHOLECYSTECTOMY     SHOULDER ARTHROSCOPY WITH ROTATOR CUFF REPAIR Left    wire stitches per patient   SHOULDER ARTHROSCOPY WITH ROTATOR CUFF REPAIR Right     Current Medications: Current Facility-Administered Medications  Medication Dose Route Frequency Provider Last Rate Last Admin   acetaminophen  (TYLENOL ) tablet 650 mg  650 mg Oral Q6H PRN Tex Drilling, NP   650 mg at 11/21/24 2126   alum & mag hydroxide-simeth (MAALOX/MYLANTA) 200-200-20 MG/5ML suspension 30 mL  30 mL Oral Q4H PRN Tex Drilling, NP       cloNIDine  (CATAPRES ) tablet 0.1 mg  0.1 mg Oral BID PRN Tex Drilling, NP       divalproex  (DEPAKOTE ) DR tablet 1,500 mg  1,500 mg Oral QHS Oliwia Berzins, MD   1,500 mg at 11/21/24 2126   divalproex  (DEPAKOTE ) DR tablet 500 mg  500 mg Oral q AM Amnah Breuer, MD   500 mg at 11/22/24 9146   losartan  (COZAAR ) tablet 25 mg  25 mg Oral Daily Tex Drilling, NP   25 mg at 11/22/24 9056   magnesium  hydroxide (MILK OF MAGNESIA) suspension 30 mL  30 mL Oral Daily PRN Tex Drilling, NP       menthol  (CEPACOL) lozenge 3 mg  1 lozenge Oral PRN Bobbitt, Shalon E, NP   3 mg at 11/21/24 2129   methimazole  (TAPAZOLE ) tablet 10 mg  10 mg Oral  Daily Tex Drilling, NP   10 mg at 11/22/24 9056   OLANZapine  (ZYPREXA ) injection 5 mg  5 mg Intramuscular TID PRN Tex Drilling, NP       OLANZapine  (ZYPREXA ) injection 5 mg  5 mg Intramuscular TID PRN Tex Drilling, NP       OLANZapine  (ZYPREXA ) tablet 20 mg  20 mg Oral QHS Jessen Siegman, MD   20 mg at 11/21/24 2126   OLANZapine  zydis (ZYPREXA ) disintegrating tablet 5 mg  5 mg Oral TID PRN Tex Drilling, NP       OLANZapine  zydis (ZYPREXA ) disintegrating tablet 5 mg  5 mg Oral TID PRN Tex Drilling, NP       pantoprazole  (PROTONIX ) EC tablet 40 mg  40 mg Oral Daily Nkwenti, Doris, NP   40 mg at 11/22/24 0944   traZODone  (DESYREL ) tablet 50 mg  50 mg Oral QHS PRN Tex Drilling, NP   50 mg at 11/21/24 2126    Lab Results:  Results for orders placed or performed during the hospital encounter of 10/17/24 (from the past 48 hours)  Valproic acid  level     Status: Abnormal   Collection Time: 11/21/24  8:36 AM  Result Value Ref Range   Valproic Acid  Lvl 44 (L) 50 - 100 ug/mL    Comment: Performed at [facility], [address] [place], [place] [address]        Blood Alcohol level:  Lab Results  Component Value Date   [facility] <15 10/14/2024   ETH <10 07/02/2022    Metabolic Disorder Labs: Lab Results  Component Value Date   HGBA1C 5.5 10/14/2024   MPG 111.15 10/14/2024   MPG 137 01/06/2022   Lab Results  Component Value Date   PROLACTIN 5.8 10/14/2024   Lab Results  Component Value Date   CHOL 167 10/14/2024   TRIG 68 10/14/2024   HDL 60 10/14/2024   CHOLHDL 2.8 10/14/2024   VLDL 14 10/14/2024   LDLCALC 93 10/14/2024   LDLCALC 73 03/13/2024    Physical Findings: AIMS:  , ,  ,  ,  CIWA:    COWS:      Psychiatric Specialty Exam:  Presentation  General Appearance:  Appropriate for Environment  Eye Contact: Fleeting  Speech: Normal Rate  Speech Volume: Decreased    Mood and Affect  Mood:fine  Affect: Flat   Thought Process   Thought Processes: impoverished  Orientation:Partial  Thought Content: Chronic delusions Hallucinations: Denies  Ideas of Reference:Delusions; Paranoia  Suicidal Thoughts: Denies  Homicidal Thoughts: Denies   Sensorium  Memory: Immediate Fair; Recent Fair  Judgment: Impaired  Insight: Shallow   Executive Functions  Concentration: Fair  Attention Span: Fair  Recall: Poor  Fund of Knowledge: Fair  Language: Fair   Psychomotor Activity  Psychomotor Activity: No data recorded  Musculoskeletal: Strength & Muscle Tone: within normal limits Gait & Station: normal Assets  Assets: Manufacturing Systems Engineer; Desire for Improvement; Social Support    Physical Exam: Physical Exam Vitals and nursing note reviewed.    ROS Blood pressure 126/60, pulse 94, temperature 99 F (37.2 C), temperature source Oral, resp. rate 18, height 5' 8 (1.727 m), weight 48.3 kg, SpO2 99%. Body mass index is 16.19 kg/m.  Diagnosis: Principal Problem:   Schizophrenia, paranoid (HCC) Major neurocognitive disorder-slums total score of 13/30   Treatment Plan Summary: APS referral has been made as patient lacks capacity to make medical decisions.  Recommending legal guardianship and further placement at appropriate level of care  Safety and Monitoring:             -- Involuntary admission to inpatient psychiatric unit for safety, stabilization and treatment             -- Daily contact with patient to assess and evaluate symptoms and progress in treatment             -- Patient's case to be discussed in multi-disciplinary team meeting             -- Observation Level: q15 minute checks             -- Vital signs:  q12 hours             -- Precautions: suicide, elopement, and assault   2. Psychiatric Diagnoses and Treatment:  Check Depakote  levels 11/21/2024           Depakote  changed to 500 mg every morning and 1500 mg nightly-in next 5 days 11/13/2024 Depakote   level-44 Zyprexa  20 mg nightly BMP-within normal limits Occupational Therapy evaluation for assessing appropriate level of care -- The risks/benefits/side-effects/alternatives to this medication were discussed in detail with the patient and time was given for questions. The patient consents to medication trial.                -- Metabolic profile and EKG monitoring obtained while on an atypical antipsychotic (BMI: Lipid Panel: HbgA1c: QTc:)              -- Encouraged patient to participate in unit milieu and in scheduled group therapies   Occupational Therapy recommendations If plan is discharge home, recommend the following:   Direct supervision/assist for medications management;Direct supervision/assist for financial management;Assist for transportation;Assistance with cooking/housework    Shari [name] was seen for OT treatment on this date. Upon arrival to room pt in dayroom, agreeable to tx. The SLUMS is a 30-point screening questionnaire that tests orientation, memory, attention, problem solving, and executive function. Pt scored a 13/30 indicating Dementia. Of note, it is not within occupational therapy scope of practice to diagnose cognitive impairments, this screen  indicates need for further testing. Pt with noted impairments in memory and problem solving limiting ability to participate functionally in medication management, bill management, and safety cooking. Will continue to follow POC. Continue to recommend SUPERVISION for safety with iADLs.    [facility] Mental Status Examination Orientation: 2/3  Calculations: 0/3 Naming animals: 2/3 Patient named 14 animals (0 points is 0-4 animals; 1 is 5-9 animals; 2 is 10-14 animals; 3 is 15+ animals) Recall: 3/5  Attention: 1/2 Clock drawing: 1/4 Visual Processing: 2/2 Paragraph Memory: 2/8  Total: 13/30;  Given that patient has completed high school, this score falls in the dementia range.   4. Discharge Planning:   -- Social  work and case management to assist with discharge planning and identification of hospital follow-up needs prior to discharge  -- Estimated LOS: 3-4 days  Allyn Foil, MD 11/22/2024, 12:10 PM

## 2024-11-22 NOTE — Progress Notes (Signed)
" °   11/21/24 2000  Psych Admission Type (Psych Patients Only)  Admission Status Involuntary  Psychosocial Assessment  Patient Complaints None  Eye Contact Fair  Facial Expression Flat  Affect Appropriate to circumstance  Speech Soft  Interaction Minimal;Guarded  Motor Activity Slow  Appearance/Hygiene Unremarkable  Behavior Characteristics Appropriate to situation  Mood Pleasant  Thought Process  Coherency WDL  Content WDL  Delusions None reported or observed  Perception WDL  Hallucination None reported or observed  Judgment WDL  Confusion Mild  Danger to Self  Current suicidal ideation? Denies  Self-Injurious Behavior No self-injurious ideation or behavior indicators observed or expressed   Agreement Not to Harm Self Yes  Description of Agreement verbal  Danger to Others  Danger to Others None reported or observed   Mood/Behavior:  Pleasant and cooperative.    Psych assessment: Denies SI/HI and AVH.     Interaction / Group attendance:  Present in the milieu. Minimal interaction with peers and staff.  Attended group.   Medication/ PRNs: Compliant with scheduled medications. Required PRNs Trazodone  for sleep and noted effective. Required prn Tylenol  for shoulder pain noted effective.    Pain: Denies  15 min checks in place for safety. "

## 2024-11-23 NOTE — Progress Notes (Signed)
" °   11/22/24 2000  Psychosocial Assessment  Patient Complaints None  Eye Contact Fair  Facial Expression Flat  Affect Appropriate to circumstance  Speech Soft  Interaction Minimal;Guarded  Motor Activity Slow  Appearance/Hygiene Unremarkable  Behavior Characteristics Appropriate to situation  Mood Pleasant  Thought Process  Coherency WDL  Content WDL  Delusions None reported or observed  Perception WDL  Hallucination None reported or observed  Judgment WDL  Confusion Mild  Danger to Self  Current suicidal ideation? Denies  Self-Injurious Behavior No self-injurious ideation or behavior indicators observed or expressed   Agreement Not to Harm Self Yes  Description of Agreement verbal  Danger to Others  Danger to Others None reported or observed   Mood/Behavior:  Pleasant and cooperative.    Psych assessment: Denies SI/HI and AVH.     Interaction / Group attendance:  Present in the milieu. Minimal interaction with peers and staff.  Attended group.   Medication/ PRNs: Compliant with scheduled medications. Required PRNs Trazodone  for sleep and noted effective. Required prn Tylenol  for shoulder pain noted effective.    Pain: Denies   15 min checks in place for safety. "

## 2024-11-23 NOTE — Group Note (Signed)
"                                                 [facility] LCSW Group Therapy Note    Group Date: 11/23/2024 Start Time: 1328 End Time: 1358  Type of Therapy and Topic:  Group Therapy:  Overcoming Obstacles  Participation Level:  [facility] PARTICIPATION LEVEL: Did Not Attend  Mood: Not able to assess (patient did not attend group).  Description of Group:   In this group patients will be encouraged to explore what they see as obstacles to their own wellness and recovery. They will be guided to discuss their thoughts, feelings, and behaviors related to these obstacles. The group will process together ways to cope with barriers, with attention given to specific choices patients can make. Each patient will be challenged to identify changes they are motivated to make in order to overcome their obstacles. This group will be process-oriented, with patients participating in exploration of their own experiences as well as giving and receiving support and challenge from other group members.  Therapeutic Goals: 1. Patient will identify personal and current obstacles as they relate to admission. 2. Patient will identify barriers that currently interfere with their wellness or overcoming obstacles.  3. Patient will identify feelings, thought process and behaviors related to these barriers. 4. Patient will identify two changes they are willing to make to overcome these obstacles:    Summary of Patient Progress  Patient did not attend group.    Therapeutic Modalities:   Cognitive Behavioral Therapy Solution Focused Therapy Motivational Interviewing Relapse Prevention Therapy   [name], LCSWA "

## 2024-11-23 NOTE — Progress Notes (Signed)
" °   11/23/24 2028  Psych Admission Type (Psych Patients Only)  Admission Status Involuntary  Psychosocial Assessment  Patient Complaints None  Eye Contact Fair  Facial Expression Flat  Affect Appropriate to circumstance  Speech Soft  Interaction Minimal  Motor Activity Slow  Appearance/Hygiene Unremarkable  Behavior Characteristics Cooperative;Appropriate to situation  Mood Pleasant  Thought Process  Coherency WDL  Content WDL  Delusions None reported or observed  Perception WDL  Hallucination None reported or observed  Judgment Impaired  Confusion Mild  Danger to Self  Current suicidal ideation? Denies  Self-Injurious Behavior No self-injurious ideation or behavior indicators observed or expressed   Agreement Not to Harm Self Yes  Description of Agreement Verbal  Danger to Others  Danger to Others None reported or observed    "

## 2024-11-23 NOTE — Plan of Care (Signed)
  Problem: Activity: Goal: Interest or engagement in activities will improve Outcome: Progressing   Problem: Physical Regulation: Goal: Ability to maintain clinical measurements within normal limits will improve Outcome: Progressing   Problem: Safety: Goal: Periods of time without injury will increase Outcome: Progressing

## 2024-11-23 NOTE — Group Note (Signed)
 Date:  11/23/2024 Time:  4:30 PM  Group Topic/Focus:  Managing Feelings:   The focus of this group is to identify what feelings patients have difficulty handling and develop a plan to handle them in a healthier way upon discharge.    Participation Level:  Active  Participation Quality:  Appropriate and Attentive  Affect:  Appropriate  Cognitive:  Alert  Insight: Appropriate  Engagement in Group:  Engaged  Modes of Intervention:  Discussion  Additional Comments:     Maglione,[name] E 11/23/2024, 4:30 PM

## 2024-11-23 NOTE — Plan of Care (Signed)
   Problem: Activity: Goal: Interest or engagement in activities will improve Outcome: Progressing   Problem: Health Behavior/Discharge Planning: Goal: Compliance with treatment plan for underlying cause of condition will improve Outcome: Progressing   Problem: Safety: Goal: Periods of time without injury will increase Outcome: Progressing

## 2024-11-23 NOTE — Progress Notes (Signed)
 Behavior:   Mild confusion.  Pleasant and cooperative.    Psych assessment:  Denies SI/HI and AVH.  Interaction / Group attendance:  Present in the milieu.  Minimal interaction with peers and staff.  Attends groups.  Medication/ PRNs: Compliant.  Pain:  Denies.  15 min checks in place for safety.

## 2024-11-23 NOTE — Group Note (Signed)
 Date:  11/23/2024 Time:  10:21 PM  Group Topic/Focus:  Identifying Needs:   The focus of this group is to help patients identify their personal needs that have been historically problematic and identify healthy behaviors to address their needs.    Participation Level:  Active  Participation Quality:  Appropriate  Affect:  Appropriate  Cognitive:  Appropriate  Insight: Appropriate  Engagement in Group:  Engaged  Modes of Intervention:  Orientation  Additional Comments:    Shari [name] 11/23/2024, 10:21 PM

## 2024-11-23 NOTE — Group Note (Signed)
 Date:  11/23/2024 Time:  10:31 AM  Group Topic/Focus:  Movement Therapy Morning strech with Avie Checo    Participation Level:  Did Not Attend    [name] 11/23/2024, 10:31 AM

## 2024-11-24 DIAGNOSIS — F0390 Unspecified dementia without behavioral disturbance: Secondary | ICD-10-CM

## 2024-11-24 DIAGNOSIS — F200 Paranoid schizophrenia: Secondary | ICD-10-CM | POA: Diagnosis not present

## 2024-11-24 NOTE — Group Note (Signed)
 Date:  11/24/2024 Time:  4:25 PM  Group Topic/Focus:  Wellness Toolbox:   The focus of this group is to discuss various aspects of wellness, balancing those aspects and exploring ways to increase the ability to experience wellness.  Patients will create a wellness toolbox for use upon discharge.    Participation Level:  Did Not Attend   Shari [name] 11/24/2024, 4:25 PM

## 2024-11-24 NOTE — BH IP Treatment Plan (Deleted)
 Interdisciplinary Treatment and Diagnostic Plan Update  11/24/2024 Time of Session: *** Shari [name] MRN: [id number]  Principal Diagnosis: Schizophrenia, paranoid (HCC)  Secondary Diagnoses: Principal Problem:   Schizophrenia, paranoid (HCC)   Current Medications:  Current Facility-Administered Medications  Medication Dose Route Frequency Provider Last Rate Last Admin   acetaminophen  (TYLENOL ) tablet 650 mg  650 mg Oral Q6H PRN Tex Drilling, NP   650 mg at 11/23/24 2114   alum & mag hydroxide-simeth (MAALOX/MYLANTA) 200-200-20 MG/5ML suspension 30 mL  30 mL Oral Q4H PRN Tex Drilling, NP       cloNIDine  (CATAPRES ) tablet 0.1 mg  0.1 mg Oral BID PRN Tex Drilling, NP       divalproex  (DEPAKOTE ) DR tablet 1,500 mg  1,500 mg Oral QHS Jadapalle, Sree, MD   1,500 mg at 11/23/24 2108   divalproex  (DEPAKOTE ) DR tablet 500 mg  500 mg Oral q AM Jadapalle, Sree, MD   500 mg at 11/23/24 1001   losartan  (COZAAR ) tablet 25 mg  25 mg Oral Daily Nkwenti, Doris, NP   25 mg at 11/23/24 1001   magnesium  hydroxide (MILK OF MAGNESIA) suspension 30 mL  30 mL Oral Daily PRN Tex Drilling, NP       menthol  (CEPACOL) lozenge 3 mg  1 lozenge Oral PRN Bobbitt, Shalon E, NP   3 mg at 11/23/24 2120   methimazole  (TAPAZOLE ) tablet 10 mg  10 mg Oral Daily Nkwenti, Doris, NP   10 mg at 11/23/24 1001   OLANZapine  (ZYPREXA ) injection 5 mg  5 mg Intramuscular TID PRN Tex Drilling, NP       OLANZapine  (ZYPREXA ) injection 5 mg  5 mg Intramuscular TID PRN Tex Drilling, NP       OLANZapine  (ZYPREXA ) tablet 20 mg  20 mg Oral QHS Jadapalle, Sree, MD   20 mg at 11/23/24 2108   OLANZapine  zydis (ZYPREXA ) disintegrating tablet 5 mg  5 mg Oral TID PRN Tex Drilling, NP       OLANZapine  zydis (ZYPREXA ) disintegrating tablet 5 mg  5 mg Oral TID PRN Tex Drilling, NP       pantoprazole  (PROTONIX ) EC tablet 40 mg  40 mg Oral Daily Nkwenti, Doris, NP   40 mg at 11/23/24 1001   traZODone  (DESYREL ) tablet 50 mg  50 mg Oral  QHS PRN Tex Drilling, NP   50 mg at 11/22/24 2137   PTA Medications: Medications Prior to Admission  Medication Sig Dispense Refill Last Dose/Taking   Ascorbic Acid (VITAMIN C PO) Take 1 tablet by mouth daily. (Patient not taking: Reported on 10/14/2024)      divalproex  (DEPAKOTE ) 500 MG DR tablet Take 1 tablet (500 mg total) by mouth at bedtime. (Patient not taking: Reported on 10/14/2024) 30 tablet 1    losartan  (COZAAR ) 25 MG tablet Take 1 tablet (25 mg total) by mouth daily. (Patient not taking: Reported on 10/14/2024) 90 tablet 1    methimazole  (TAPAZOLE ) 10 MG tablet Take 1 tablet (10 mg total) by mouth daily. (Patient not taking: Reported on 10/14/2024) 90 tablet 1    OLANZapine  (ZYPREXA ) 10 MG tablet Take 1 tablet (10 mg total) by mouth at bedtime. (Patient not taking: Reported on 10/14/2024) 30 tablet 1    pantoprazole  (PROTONIX ) 40 MG tablet Take 1 tablet (40 mg total) by mouth daily. (Patient not taking: Reported on 10/14/2024) 90 tablet 0    propranolol  (INDERAL ) 10 MG tablet Take 1 tablet (10 mg total) by mouth 2 (two) times daily. (Patient not  taking: Reported on 10/14/2024) 180 tablet 1    VITAMIN D PO Take 1 tablet by mouth daily. (Patient not taking: Reported on 10/14/2024)      VITAMIN E PO Take 1 tablet by mouth daily. (Patient not taking: Reported on 10/14/2024)       Patient Stressors: Medication change or noncompliance    Patient Strengths: Communication skills   Treatment Modalities: Medication Management, Group therapy, Case management,  1 to 1 session with clinician, Psychoeducation, Recreational therapy.   Physician Treatment Plan for Primary Diagnosis: Schizophrenia, paranoid (HCC) Long Term Goal(s): Improvement in symptoms so as ready for discharge   Short Term Goals: Ability to identify changes in lifestyle to reduce recurrence of condition will improve Ability to verbalize feelings will improve Ability to disclose and discuss suicidal ideas Ability to  demonstrate self-control will improve Ability to identify and develop effective coping behaviors will improve Ability to maintain clinical measurements within normal limits will improve  Medication Management: Evaluate patient's response, side effects, and tolerance of medication regimen.  Therapeutic Interventions: 1 to 1 sessions, Unit Group sessions and Medication administration.  Evaluation of Outcomes: {BHH Tx Plan Outcomes:30414004}  Physician Treatment Plan for Secondary Diagnosis: Principal Problem:   Schizophrenia, paranoid (HCC)  Long Term Goal(s): Improvement in symptoms so as ready for discharge   Short Term Goals: Ability to identify changes in lifestyle to reduce recurrence of condition will improve Ability to verbalize feelings will improve Ability to disclose and discuss suicidal ideas Ability to demonstrate self-control will improve Ability to identify and develop effective coping behaviors will improve Ability to maintain clinical measurements within normal limits will improve     Medication Management: Evaluate patient's response, side effects, and tolerance of medication regimen.  Therapeutic Interventions: 1 to 1 sessions, Unit Group sessions and Medication administration.  Evaluation of Outcomes: {B[facility] Tx Plan Outcomes:30414004}   RN Treatment Plan for Primary Diagnosis: Schizophrenia, paranoid (HCC) Long Term Goal(s): {BHH RN Tx Plan Long Term Goals:30414009::Knowledge of disease and therapeutic regimen to maintain health will improve}  Short Term Goals: {BHH RN Tx Plan Short Term Hnjod:69585994}  Medication Management: RN will administer medications as ordered by provider, will assess and evaluate patient's response and provide education to patient for prescribed medication. RN will report any adverse and/or side effects to prescribing provider.  Therapeutic Interventions: 1 on 1 counseling sessions, Psychoeducation, Medication administration, Evaluate  responses to treatment, Monitor vital signs and CBGs as ordered, Perform/monitor CIWA, COWS, AIMS and Fall Risk screenings as ordered, Perform wound care treatments as ordered.  Evaluation of Outcomes: {BHH Tx Plan Outcomes:30414004}   LCSW Treatment Plan for Primary Diagnosis: Schizophrenia, paranoid (HCC) Long Term Goal(s): Safe transition to appropriate next level of care at discharge, Engage patient in therapeutic group addressing interpersonal concerns.  Short Term Goals: {BHH LCSW [place] PLAN SHORT TERM GOALS:30414010::Engage patient in aftercare planning with referrals and resources}  Therapeutic Interventions: Assess for all discharge needs, 1 to 1 time with Social worker, Explore available resources and support systems, Assess for adequacy in community support network, Educate family and significant other(s) on suicide prevention, Complete Psychosocial Assessment, Interpersonal group therapy.  Evaluation of Outcomes: {BHH Tx Plan Outcomes:30414004}   Progress in Treatment: Attending groups: {B[facility] ADULT:22608} Participating in groups: {B[facility] ADULT:22608} Taking medication as prescribed: {B[facility] ADULT:22608} Toleration medication: {B[facility] ADULT:22608} Family/Significant other contact made: {YES/NO/CONTACT:22665} Patient understands diagnosis: {BHH JILOU:77391} Discussing patient identified problems/goals with staff: {BHH JILOU:77391} Medical problems stabilized or resolved: {B[facility] ADULT:22608} Denies suicidal/homicidal ideation: {BHH ADULT:22608} Issues/concerns per  patient self-inventory: {BHH JILOU:77391} Other: ***  New problem(s) identified: {BHH NEW PROBLEMS:22609}  New Short Term/Long Term Goal(s):  Patient Goals:    Discharge Plan or Barriers:   Reason for Continuation of Hospitalization: {BHH Reasons for continued hospitalization:22604}  Estimated Length of Stay:  Last 3 [place] Suicide Severity Risk Score: Flowsheet Row Admission (Current) from 10/17/2024 in [facility] ED from 10/14/2024 in [facility] to Hosp-Admission (Discharged) from 03/13/2024 in [facility]  C-SSRS RISK CATEGORY No Risk No Risk No Risk    Last PHQ 2/9 Scores:     No data to display          Scribe for Treatment Team: Garnetta Fedrick W Nels Munn, LCSWA 11/24/2024 8:25 AM

## 2024-11-24 NOTE — Group Note (Signed)
 Date:  11/24/2024 Time:  10:36 AM  Group Topic/Focus:  Movement Therapy, Morning Stretch with [name].    Participation Level:  Did Not Attend    Shari [name] Bias 11/24/2024, 10:36 AM

## 2024-11-24 NOTE — BH IP Treatment Plan (Deleted)
 Interdisciplinary Treatment and Diagnostic Plan Update  11/24/2024 Time of Session: 9:16am Shari [name] MRN: [id number]  Principal Diagnosis: Schizophrenia, paranoid (HCC)  Secondary Diagnoses: Principal Problem:   Schizophrenia, paranoid (HCC)   Current Medications:  Current Facility-Administered Medications  Medication Dose Route Frequency Provider Last Rate Last Admin   acetaminophen  (TYLENOL ) tablet 650 mg  650 mg Oral Q6H PRN Tex Drilling, NP   650 mg at 11/23/24 2114   alum & mag hydroxide-simeth (MAALOX/MYLANTA) 200-200-20 MG/5ML suspension 30 mL  30 mL Oral Q4H PRN Tex Drilling, NP       cloNIDine  (CATAPRES ) tablet 0.1 mg  0.1 mg Oral BID PRN Tex Drilling, NP       divalproex  (DEPAKOTE ) DR tablet 1,500 mg  1,500 mg Oral QHS Jadapalle, Sree, MD   1,500 mg at 11/23/24 2108   divalproex  (DEPAKOTE ) DR tablet 500 mg  500 mg Oral q AM Jadapalle, Sree, MD   500 mg at 11/23/24 1001   losartan  (COZAAR ) tablet 25 mg  25 mg Oral Daily Nkwenti, Doris, NP   25 mg at 11/23/24 1001   magnesium  hydroxide (MILK OF MAGNESIA) suspension 30 mL  30 mL Oral Daily PRN Tex Drilling, NP       menthol  (CEPACOL) lozenge 3 mg  1 lozenge Oral PRN Bobbitt, Shalon E, NP   3 mg at 11/23/24 2120   methimazole  (TAPAZOLE ) tablet 10 mg  10 mg Oral Daily Nkwenti, Doris, NP   10 mg at 11/23/24 1001   OLANZapine  (ZYPREXA ) injection 5 mg  5 mg Intramuscular TID PRN Tex Drilling, NP       OLANZapine  (ZYPREXA ) injection 5 mg  5 mg Intramuscular TID PRN Tex Drilling, NP       OLANZapine  (ZYPREXA ) tablet 20 mg  20 mg Oral QHS Jadapalle, Sree, MD   20 mg at 11/23/24 2108   OLANZapine  zydis (ZYPREXA ) disintegrating tablet 5 mg  5 mg Oral TID PRN Tex Drilling, NP       OLANZapine  zydis (ZYPREXA ) disintegrating tablet 5 mg  5 mg Oral TID PRN Tex Drilling, NP       pantoprazole  (PROTONIX ) EC tablet 40 mg  40 mg Oral Daily Nkwenti, Doris, NP   40 mg at 11/23/24 1001   traZODone  (DESYREL ) tablet 50 mg  50 mg  Oral QHS PRN Tex Drilling, NP   50 mg at 11/22/24 2137   PTA Medications: Medications Prior to Admission  Medication Sig Dispense Refill Last Dose/Taking   Ascorbic Acid (VITAMIN C PO) Take 1 tablet by mouth daily. (Patient not taking: Reported on 10/14/2024)      divalproex  (DEPAKOTE ) 500 MG DR tablet Take 1 tablet (500 mg total) by mouth at bedtime. (Patient not taking: Reported on 10/14/2024) 30 tablet 1    losartan  (COZAAR ) 25 MG tablet Take 1 tablet (25 mg total) by mouth daily. (Patient not taking: Reported on 10/14/2024) 90 tablet 1    methimazole  (TAPAZOLE ) 10 MG tablet Take 1 tablet (10 mg total) by mouth daily. (Patient not taking: Reported on 10/14/2024) 90 tablet 1    OLANZapine  (ZYPREXA ) 10 MG tablet Take 1 tablet (10 mg total) by mouth at bedtime. (Patient not taking: Reported on 10/14/2024) 30 tablet 1    pantoprazole  (PROTONIX ) 40 MG tablet Take 1 tablet (40 mg total) by mouth daily. (Patient not taking: Reported on 10/14/2024) 90 tablet 0    propranolol  (INDERAL ) 10 MG tablet Take 1 tablet (10 mg total) by mouth 2 (two) times daily. (Patient not  taking: Reported on 10/14/2024) 180 tablet 1    VITAMIN D PO Take 1 tablet by mouth daily. (Patient not taking: Reported on 10/14/2024)      VITAMIN E PO Take 1 tablet by mouth daily. (Patient not taking: Reported on 10/14/2024)       Patient Stressors: Medication change or noncompliance    Patient Strengths: Communication skills   Treatment Modalities: Medication Management, Group therapy, Case management,  1 to 1 session with clinician, Psychoeducation, Recreational therapy.   Physician Treatment Plan for Primary Diagnosis: Schizophrenia, paranoid (HCC) Long Term Goal(s): Improvement in symptoms so as ready for discharge   Short Term Goals: Ability to identify changes in lifestyle to reduce recurrence of condition will improve Ability to verbalize feelings will improve Ability to disclose and discuss suicidal ideas Ability  to demonstrate self-control will improve Ability to identify and develop effective coping behaviors will improve Ability to maintain clinical measurements within normal limits will improve  Medication Management: Evaluate patient's response, side effects, and tolerance of medication regimen.  Therapeutic Interventions: 1 to 1 sessions, Unit Group sessions and Medication administration.  Evaluation of Outcomes: Progressing  Physician Treatment Plan for Secondary Diagnosis: Principal Problem:   Schizophrenia, paranoid (HCC)  Long Term Goal(s): Improvement in symptoms so as ready for discharge   Short Term Goals: Ability to identify changes in lifestyle to reduce recurrence of condition will improve Ability to verbalize feelings will improve Ability to disclose and discuss suicidal ideas Ability to demonstrate self-control will improve Ability to identify and develop effective coping behaviors will improve Ability to maintain clinical measurements within normal limits will improve     Medication Management: Evaluate patient's response, side effects, and tolerance of medication regimen.  Therapeutic Interventions: 1 to 1 sessions, Unit Group sessions and Medication administration.  Evaluation of Outcomes: Progressing   RN Treatment Plan for Primary Diagnosis: Schizophrenia, paranoid (HCC) Long Term Goal(s): Knowledge of disease and therapeutic regimen to maintain health will improve and ***  Short Term Goals: Ability to remain free from injury will improve, Ability to verbalize frustration and anger appropriately will improve, Ability to demonstrate self-control, Ability to participate in decision making will improve, Ability to verbalize feelings will improve, Ability to disclose and discuss suicidal ideas, Ability to identify and develop effective coping behaviors will improve, and Compliance with prescribed medications will improve  Medication Management: RN will administer  medications as ordered by provider, will assess and evaluate patient's response and provide education to patient for prescribed medication. RN will report any adverse and/or side effects to prescribing provider.  Therapeutic Interventions: 1 on 1 counseling sessions, Psychoeducation, Medication administration, Evaluate responses to treatment, Monitor vital signs and CBGs as ordered, Perform/monitor CIWA, COWS, AIMS and Fall Risk screenings as ordered, Perform wound care treatments as ordered.  Evaluation of Outcomes: Progressing   LCSW Treatment Plan for Primary Diagnosis: Schizophrenia, paranoid (HCC) Long Term Goal(s): Safe transition to appropriate next level of care at discharge, Engage patient in therapeutic group addressing interpersonal concerns.  Short Term Goals: Engage patient in aftercare planning with referrals and resources, Increase social support, Increase ability to appropriately verbalize feelings, Increase emotional regulation, Facilitate acceptance of mental health diagnosis and concerns, Facilitate patient progression through stages of change regarding substance use diagnoses and concerns, Identify triggers associated with mental health/substance abuse issues, and Increase skills for wellness and recovery  Therapeutic Interventions: Assess for all discharge needs, 1 to 1 time with Social worker, Explore available resources and support systems, Assess for adequacy in  community support network, Educate family and significant other(s) on suicide prevention, Complete Psychosocial Assessment, Interpersonal group therapy.  Evaluation of Outcomes: Progressing   Progress in Treatment: Attending groups: No. Participating in groups: No. Taking medication as prescribed: Yes. Toleration medication: Yes Family/Significant other contact made: No, will contact: if given permission. Patient understands diagnosis: No. Discussing patient identified problems/goals with staff: No. Medical  problems stabilized or resolved: {BHH ADULT:22608} Denies suicidal/homicidal ideation: {BHH ADULT:22608} Issues/concerns per patient self-inventory: {BHH ADULT:22608} Other: ***  New problem(s) identified: {BHH NEW PROBLEMS:22609}  New Short Term/Long Term Goal(s):  Patient Goals:    Discharge Plan or Barriers:   Reason for Continuation of Hospitalization: {BHH Reasons for continued hospitalization:22604}  Estimated Length of Stay:  Last 3 [place] Suicide Severity Risk Score: Flowsheet Row Admission (Current) from 10/17/2024 in [facility] BEHAVIORAL MEDICINE ED from 10/14/2024 in [facility] ED to Hosp-Admission (Discharged) from 03/13/2024 in [facility]  C-SSRS RISK CATEGORY No Risk No Risk No Risk    Last PHQ 2/9 Scores:     No data to display          Scribe for Treatment Team: Omarii Scalzo W Kimi Bordeau, LCSWA 11/24/2024 9:15 AM

## 2024-11-24 NOTE — Progress Notes (Signed)
 [facility] MD Progress Note  11/24/2024 8:34 AM Shari [name]  MRN:  [id number] Patient is a 68 year old female with a history of Schizoaffective Disorder, Bipolar Type who presents via [employer] under IVC, initiated by CM, Falencio with re-entry program, to [facility] for assessment. Per IVC, [name] has been diagnosed with Schizophrenia and is refusing to take medication. [name] isn't sleeping. Residents report her screaming and yelling all night long.  [name] responding to internal stimuli.  [name] verbally aggressive.  The respondent has locked all of the residents inside of the transitional housing home.  They could not leave the home which prompted the IVC.  Residents did not have access to food, the restroom or anything as a result of being locked in.  [name] is currently under Adult protective services care. Patient is admitted to [facility] unit with Q15 min safety monitoring. Multidisciplinary team approach is offered. Medication management; group/milieu therapy is offered.   Subjective:  Chart reviewed, case discussed in multidisciplinary meeting, patient seen during rounds.    P.  Patient is very focused on getting discharged awaiting placement denies any current suicidal or homicidal thoughts Past Psychiatric History: see h&P Family History:  Family History  Problem Relation Age of Onset   Diabetes Mother    CAD Mother    Lung cancer Father    Social History:  Social History   Substance and Sexual Activity  Alcohol Use Yes   Comment: beer occasionally     Social History   Substance and Sexual Activity  Drug Use No    Social History   Socioeconomic History   Marital status: Widowed    Spouse name: Not on file   Number of children: Not on file   Years of education: Not on file   Highest education level: Not on file  Occupational History   Not on file  Tobacco Use   Smoking status: Every Day    Current packs/day: 0.50    Average packs/day: 0.5  packs/day for 40.0 years (20.0 ttl pk-yrs)    Types: Cigarettes   Smokeless tobacco: Never  Vaping Use   Vaping status: Never Used  Substance and Sexual Activity   Alcohol use: Yes    Comment: beer occasionally   Drug use: No   Sexual activity: Not Currently    Birth control/protection: Post-menopausal  Other Topics Concern   Not on file  Social History Narrative   Not on file   Social Drivers of Health   Tobacco Use: High Risk (11/02/2024)   Patient History    Smoking Tobacco Use: Every Day    Smokeless Tobacco Use: Never    Passive Exposure: Not on file  Financial Resource Strain: Not on file  Food Insecurity: No Food Insecurity (10/17/2024)   Epic    Worried About Programme Researcher, Broadcasting/film/video in the Last Year: Never true    Ran Out of Food in the Last Year: Never true  Transportation Needs: No Transportation Needs (10/17/2024)   Epic    Lack of Transportation (Medical): No    Lack of Transportation (Non-Medical): No  Recent Concern: Transportation Needs - Unmet Transportation Needs (10/14/2024)   Epic    Lack of Transportation (Medical): Yes    Lack of Transportation (Non-Medical): Yes  Physical Activity: Not on file  Stress: Not on file  Social Connections: Unknown (10/17/2024)   Social Connection and Isolation Panel    Frequency of Communication with Friends and Family: Patient unable to  answer    Frequency of Social Gatherings with Friends and Family: Patient unable to answer    Attends Religious Services: Patient unable to answer    Active Member of Clubs or Organizations: Patient declined    Attends Banker Meetings: Patient unable to answer    Marital Status: Widowed  Depression (PHQ2-9): Not on file  Alcohol Screen: Low Risk (10/17/2024)   Alcohol Screen    Last Alcohol Screening Score (AUDIT): 0  Housing: Low Risk (10/17/2024)   Epic    Unable to Pay for Housing in the Last Year: No    Number of Times Moved in the Last Year: 0    Homeless in the  Last Year: No  Utilities: Not At Risk (10/17/2024)   Epic    Threatened with loss of utilities: No  Health Literacy: Not on file   Past Medical History:  Past Medical History:  Diagnosis Date   Eye globe prosthesis    GERD (gastroesophageal reflux disease)    History of blood transfusion 1973   related to abscess burst in my stomach   Hyperlipemia    Hyperlipidemia    Hypertension    Osteoarthritis of back    Lowerback    SVD (spontaneous vaginal delivery)    x 1, baby died at 2 wks of age   Type II diabetes mellitus (HCC)     Past Surgical History:  Procedure Laterality Date   APPENDECTOMY     COLONOSCOPY     DILATATION & CURETTAGE/HYSTEROSCOPY WITH MYOSURE N/A 02/25/2015   Procedure: DILATATION & CURETTAGE/HYSTEROSCOPY WITH MYOSURE;  Surgeon: Truman Corona, MD;  Location: [facility];  Service: Gynecology;  Laterality: N/A;   DILATION AND CURETTAGE OF UTERUS     ENUCLEATION Right 03/16/2017   ENUCLEATION Right 03/16/2017   Procedure: ENUCLEATION RIGHT EYE;  Surgeon: Loyd Kathryne Palm, MD;  Location: [facility] OR;  Service: Ophthalmology;  Laterality: Right;   EYE SURGERY     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6, 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   EYE SURGERY Right ~ 1974   S/P initial eye injury; scissors stuck in my eye   LAPAROSCOPIC CHOLECYSTECTOMY     SHOULDER ARTHROSCOPY WITH ROTATOR CUFF REPAIR Left    wire stitches per patient   SHOULDER ARTHROSCOPY WITH ROTATOR CUFF REPAIR Right     Current Medications: Current Facility-Administered Medications  Medication Dose Route Frequency Provider Last Rate Last Admin   acetaminophen  (TYLENOL ) tablet 650 mg  650 mg Oral Q6H PRN Tex Drilling, NP   650 mg at 11/23/24 2114   alum & mag hydroxide-simeth (MAALOX/MYLANTA) 200-200-20 MG/5ML suspension 30 mL  30 mL Oral Q4H PRN Tex Drilling, NP       cloNIDine  (CATAPRES ) tablet 0.1 mg  0.1 mg Oral BID PRN Tex Drilling, NP       divalproex  (DEPAKOTE ) DR tablet 1,500 mg  1,500 mg Oral QHS Jadapalle, Sree, MD    1,500 mg at 11/23/24 2108   divalproex  (DEPAKOTE ) DR tablet 500 mg  500 mg Oral q AM Jadapalle, Sree, MD   500 mg at 11/23/24 1001   losartan  (COZAAR ) tablet 25 mg  25 mg Oral Daily Nkwenti, Doris, NP   25 mg at 11/23/24 1001   magnesium  hydroxide (MILK OF MAGNESIA) suspension 30 mL  30 mL Oral Daily PRN Tex Drilling, NP       menthol  (CEPACOL) lozenge 3 mg  1 lozenge Oral PRN Bobbitt, Shalon E, NP   3 mg at 11/23/24 2120  methimazole  (TAPAZOLE ) tablet 10 mg  10 mg Oral Daily Tex Drilling, NP   10 mg at 11/23/24 1001   OLANZapine  (ZYPREXA ) injection 5 mg  5 mg Intramuscular TID PRN Tex Drilling, NP       OLANZapine  (ZYPREXA ) injection 5 mg  5 mg Intramuscular TID PRN Tex Drilling, NP       OLANZapine  (ZYPREXA ) tablet 20 mg  20 mg Oral QHS Jadapalle, Sree, MD   20 mg at 11/23/24 2108   OLANZapine  zydis (ZYPREXA ) disintegrating tablet 5 mg  5 mg Oral TID PRN Tex Drilling, NP       OLANZapine  zydis (ZYPREXA ) disintegrating tablet 5 mg  5 mg Oral TID PRN Tex Drilling, NP       pantoprazole  (PROTONIX ) EC tablet 40 mg  40 mg Oral Daily Nkwenti, Doris, NP   40 mg at 11/23/24 1001   traZODone  (DESYREL ) tablet 50 mg  50 mg Oral QHS PRN Tex Drilling, NP   50 mg at 11/22/24 2137    Lab Results:  No results found for this or any previous visit (from the past 48 hours).       Blood Alcohol level:  Lab Results  Component Value Date   [facility] <15 10/14/2024   ETH <10 07/02/2022    Metabolic Disorder Labs: Lab Results  Component Value Date   HGBA1C 5.5 10/14/2024   MPG 111.15 10/14/2024   MPG 137 01/06/2022   Lab Results  Component Value Date   PROLACTIN 5.8 10/14/2024   Lab Results  Component Value Date   CHOL 167 10/14/2024   TRIG 68 10/14/2024   HDL 60 10/14/2024   CHOLHDL 2.8 10/14/2024   VLDL 14 10/14/2024   LDLCALC 93 10/14/2024   LDLCALC 73 03/13/2024    Physical Findings: AIMS:  , ,  ,  ,    CIWA:    COWS:      Psychiatric Specialty Exam:  Presentation   General Appearance:  Appropriate for Environment  Eye Contact: Fleeting  Speech: Normal Rate  Speech Volume: Decreased    Mood and Affect  Mood:fine  Affect: Flat   Thought Process  Thought Processes: impoverished  Orientation:Partial  Thought Content: Chronic delusions Hallucinations: Denies  Ideas of Reference:Delusions; Paranoia  Suicidal Thoughts: Denies  Homicidal Thoughts: Denies   Sensorium  Memory: Immediate Fair; Recent Fair  Judgment: Impaired  Insight: Shallow   Executive Functions  Concentration: Fair  Attention Span: Fair  Recall: Poor  Fund of Knowledge: Fair  Language: Fair   Psychomotor Activity  Psychomotor Activity: No data recorded  Musculoskeletal: Strength & Muscle Tone: within normal limits Gait & Station: normal Assets  Assets: Manufacturing Systems Engineer; Desire for Improvement; Social Support    Physical Exam: Physical Exam Vitals and nursing note reviewed.    ROS Blood pressure (!) 157/76, pulse 80, temperature 98.4 F (36.9 C), resp. rate 18, height 5' 8 (1.727 m), weight 48.3 kg, SpO2 99%. Body mass index is 16.19 kg/m.  Diagnosis: Principal Problem:   Schizophrenia, paranoid (HCC) Major neurocognitive disorder-slums total score of 13/30   Treatment Plan Summary: APS referral has been made as patient lacks capacity to make medical decisions.  Recommending legal guardianship and further placement at appropriate level of care  Safety and Monitoring:             -- Involuntary admission to inpatient psychiatric unit for safety, stabilization and treatment             -- Daily contact with patient  to assess and evaluate symptoms and progress in treatment             -- Patient's case to be discussed in multi-disciplinary team meeting             -- Observation Level: q15 minute checks             -- Vital signs:  q12 hours             -- Precautions: suicide, elopement, and assault   2.  Psychiatric Diagnoses and Treatment:  Check Depakote  levels 11/21/2024           Depakote  changed to 500 mg every morning and 1500 mg nightly-in next 5 days 11/13/2024 Depakote  level-44 Zyprexa  20 mg nightly BMP-within normal limits Occupational Therapy evaluation for assessing appropriate level of care -- The risks/benefits/side-effects/alternatives to this medication were discussed in detail with the patient and time was given for questions. The patient consents to medication trial.                -- Metabolic profile and EKG monitoring obtained while on an atypical antipsychotic (BMI: Lipid Panel: HbgA1c: QTc:)              -- Encouraged patient to participate in unit milieu and in scheduled group therapies   Occupational Therapy recommendations If plan is discharge home, recommend the following:   Direct supervision/assist for medications management;Direct supervision/assist for financial management;Assist for transportation;Assistance with cooking/housework    Ms Grosch was seen for OT treatment on this date. Upon arrival to room pt in dayroom, agreeable to tx. The SLUMS is a 30-point screening questionnaire that tests orientation, memory, attention, problem solving, and executive function. Pt scored a 13/30 indicating Dementia. Of note, it is not within occupational therapy scope of practice to diagnose cognitive impairments, this screen indicates need for further testing. Pt with noted impairments in memory and problem solving limiting ability to participate functionally in medication management, bill management, and safety cooking. Will continue to follow POC. Continue to recommend SUPERVISION for safety with iADLs.    [facility] Mental Status Examination Orientation: 2/3  Calculations: 0/3 Naming animals: 2/3 Patient named 14 animals (0 points is 0-4 animals; 1 is 5-9 animals; 2 is 10-14 animals; 3 is 15+ animals) Recall: 3/5  Attention: 1/2 Clock drawing: 1/4 Visual  Processing: 2/2 Paragraph Memory: 2/8  Total: 13/30;  Given that patient has completed high school, this score falls in the dementia range.   4. Discharge Planning:   -- Social work and case management to assist with discharge planning and identification of hospital follow-up needs prior to discharge  -- Estimated LOS: 3-4 days  Millie JONELLE Manners, MD 11/24/2024, 8:34 AM

## 2024-11-24 NOTE — BH IP Treatment Plan (Signed)
 Interdisciplinary Treatment and Diagnostic Plan Update  11/24/2024 Time of Session: 12:46pm KAALIYAH KITA MRN: [id number]  Principal Diagnosis: Schizophrenia, paranoid (HCC)  Secondary Diagnoses: Principal Problem:   Schizophrenia, paranoid (HCC)   Current Medications:  Current Facility-Administered Medications  Medication Dose Route Frequency Provider Last Rate Last Admin   acetaminophen  (TYLENOL ) tablet 650 mg  650 mg Oral Q6H PRN Tex Drilling, NP   650 mg at 11/23/24 2114   alum & mag hydroxide-simeth (MAALOX/MYLANTA) 200-200-20 MG/5ML suspension 30 mL  30 mL Oral Q4H PRN Tex Drilling, NP       cloNIDine  (CATAPRES ) tablet 0.1 mg  0.1 mg Oral BID PRN Tex Drilling, NP       divalproex  (DEPAKOTE ) DR tablet 1,500 mg  1,500 mg Oral QHS Jadapalle, Sree, MD   1,500 mg at 11/23/24 2108   divalproex  (DEPAKOTE ) DR tablet 500 mg  500 mg Oral q AM Jadapalle, Sree, MD   500 mg at 11/24/24 9075   losartan  (COZAAR ) tablet 25 mg  25 mg Oral Daily Nkwenti, Doris, NP   25 mg at 11/24/24 0924   magnesium  hydroxide (MILK OF MAGNESIA) suspension 30 mL  30 mL Oral Daily PRN Tex Drilling, NP       menthol  (CEPACOL) lozenge 3 mg  1 lozenge Oral PRN Bobbitt, Shalon E, NP   3 mg at 11/23/24 2120   methimazole  (TAPAZOLE ) tablet 10 mg  10 mg Oral Daily Nkwenti, Doris, NP   10 mg at 11/24/24 9075   OLANZapine  (ZYPREXA ) injection 5 mg  5 mg Intramuscular TID PRN Tex Drilling, NP       OLANZapine  (ZYPREXA ) injection 5 mg  5 mg Intramuscular TID PRN Tex Drilling, NP       OLANZapine  (ZYPREXA ) tablet 20 mg  20 mg Oral QHS Jadapalle, Sree, MD   20 mg at 11/23/24 2108   OLANZapine  zydis (ZYPREXA ) disintegrating tablet 5 mg  5 mg Oral TID PRN Tex Drilling, NP       OLANZapine  zydis (ZYPREXA ) disintegrating tablet 5 mg  5 mg Oral TID PRN Tex Drilling, NP       pantoprazole  (PROTONIX ) EC tablet 40 mg  40 mg Oral Daily Nkwenti, Doris, NP   40 mg at 11/24/24 9075   traZODone  (DESYREL ) tablet 50 mg  50 mg  Oral QHS PRN Tex Drilling, NP   50 mg at 11/22/24 2137   PTA Medications: Medications Prior to Admission  Medication Sig Dispense Refill Last Dose/Taking   Ascorbic Acid (VITAMIN C PO) Take 1 tablet by mouth daily. (Patient not taking: Reported on 10/14/2024)      divalproex  (DEPAKOTE ) 500 MG DR tablet Take 1 tablet (500 mg total) by mouth at bedtime. (Patient not taking: Reported on 10/14/2024) 30 tablet 1    losartan  (COZAAR ) 25 MG tablet Take 1 tablet (25 mg total) by mouth daily. (Patient not taking: Reported on 10/14/2024) 90 tablet 1    methimazole  (TAPAZOLE ) 10 MG tablet Take 1 tablet (10 mg total) by mouth daily. (Patient not taking: Reported on 10/14/2024) 90 tablet 1    OLANZapine  (ZYPREXA ) 10 MG tablet Take 1 tablet (10 mg total) by mouth at bedtime. (Patient not taking: Reported on 10/14/2024) 30 tablet 1    pantoprazole  (PROTONIX ) 40 MG tablet Take 1 tablet (40 mg total) by mouth daily. (Patient not taking: Reported on 10/14/2024) 90 tablet 0    propranolol  (INDERAL ) 10 MG tablet Take 1 tablet (10 mg total) by mouth 2 (two) times daily. (Patient not  taking: Reported on 10/14/2024) 180 tablet 1    VITAMIN D PO Take 1 tablet by mouth daily. (Patient not taking: Reported on 10/14/2024)      VITAMIN E PO Take 1 tablet by mouth daily. (Patient not taking: Reported on 10/14/2024)       Patient Stressors: Medication change or noncompliance    Patient Strengths: Communication skills   Treatment Modalities: Medication Management, Group therapy, Case management,  1 to 1 session with clinician, Psychoeducation, Recreational therapy.   Physician Treatment Plan for Primary Diagnosis: Schizophrenia, paranoid (HCC) Long Term Goal(s): Improvement in symptoms so as ready for discharge   Short Term Goals: Ability to identify changes in lifestyle to reduce recurrence of condition will improve Ability to verbalize feelings will improve Ability to disclose and discuss suicidal ideas Ability  to demonstrate self-control will improve Ability to identify and develop effective coping behaviors will improve Ability to maintain clinical measurements within normal limits will improve  Medication Management: Evaluate patient's response, side effects, and tolerance of medication regimen.  Therapeutic Interventions: 1 to 1 sessions, Unit Group sessions and Medication administration.  Evaluation of Outcomes: Progressing  Physician Treatment Plan for Secondary Diagnosis: Principal Problem:   Schizophrenia, paranoid (HCC)  Long Term Goal(s): Improvement in symptoms so as ready for discharge   Short Term Goals: Ability to identify changes in lifestyle to reduce recurrence of condition will improve Ability to verbalize feelings will improve Ability to disclose and discuss suicidal ideas Ability to demonstrate self-control will improve Ability to identify and develop effective coping behaviors will improve Ability to maintain clinical measurements within normal limits will improve     Medication Management: Evaluate patient's response, side effects, and tolerance of medication regimen.  Therapeutic Interventions: 1 to 1 sessions, Unit Group sessions and Medication administration.  Evaluation of Outcomes: Progressing   RN Treatment Plan for Primary Diagnosis: Schizophrenia, paranoid (HCC) Long Term Goal(s): Knowledge of disease and therapeutic regimen to maintain health will improve  Short Term Goals: Ability to remain free from injury will improve, Ability to verbalize frustration and anger appropriately will improve, Ability to demonstrate self-control, Ability to participate in decision making will improve, Ability to verbalize feelings will improve, Ability to disclose and discuss suicidal ideas, Ability to identify and develop effective coping behaviors will improve, and Compliance with prescribed medications will improve  Medication Management: RN will administer medications as  ordered by provider, will assess and evaluate patient's response and provide education to patient for prescribed medication. RN will report any adverse and/or side effects to prescribing provider.  Therapeutic Interventions: 1 on 1 counseling sessions, Psychoeducation, Medication administration, Evaluate responses to treatment, Monitor vital signs and CBGs as ordered, Perform/monitor CIWA, COWS, AIMS and Fall Risk screenings as ordered, Perform wound care treatments as ordered.  Evaluation of Outcomes: Progressing   LCSW Treatment Plan for Primary Diagnosis: Schizophrenia, paranoid (HCC) Long Term Goal(s): Safe transition to appropriate next level of care at discharge, Engage patient in therapeutic group addressing interpersonal concerns.  Short Term Goals: Engage patient in aftercare planning with referrals and resources, Increase social support, Increase ability to appropriately verbalize feelings, Increase emotional regulation, Facilitate acceptance of mental health diagnosis and concerns, Facilitate patient progression through stages of change regarding substance use diagnoses and concerns, Identify triggers associated with mental health/substance abuse issues, and Increase skills for wellness and recovery  Therapeutic Interventions: Assess for all discharge needs, 1 to 1 time with Social worker, Explore available resources and support systems, Assess for adequacy in community support  network, Educate family and significant other(s) on suicide prevention, Complete Psychosocial Assessment, Interpersonal group therapy.  Evaluation of Outcomes: Progressing   Progress in Treatment: Attending groups: No. Participating in groups: No. Taking medication as prescribed: Yes. Toleration medication: Yes. Family/Significant other contact made: No, will contact:  once pt gives permission. Patient understands diagnosis: No. Discussing patient identified problems/goals with staff: No. Medical problems  stabilized or resolved: Yes. Denies suicidal/homicidal ideation: Yes. Issues/concerns per patient self-inventory: No. Other: None  New problem(s) identified: No, Describe:  None identified 10/23/24 Update: No changes at this time. 10/28/24 Update: No changes at this time. Update 11/03/24: No changes at this time Update 11/09/24: No changes at this time Update 11/14/24: No changes at this time    11/24/24 Update: No changes at this time.     New Short Term/Long Term Goal(s): elimination of symptoms of psychosis, medication management for mood stabilization; elimination of SI thoughts; development of comprehensive mental wellness plan. 10/23/24 Update: No changes at this time. 10/28/24 Update: No changes at this time. Update 11/03/24: No changes at this time Update 11/09/24: No changes at this time Update 11/14/24: No changes at this time. 11/24/24 Update: No changes at this time.     Patient Goals:  I haven't set any goals for the hospital yet. I don't need psychiatric help 10/23/24 Update: No changes at this time. 10/28/24 Update: No changes at this time. Update 11/03/24: No changes at this time Update 11/09/24: No changes at this time Update 11/14/24: No changes at this time2/21/25 Update: No changes at this time.     Discharge Plan or Barriers: CSW will assist with appropriate discharge plan 10/23/24 Update: No changes at this time. 10/28/24 Update: CSW to continue to meet with DSS to engage in safe discharge planning and connect patient with appropriate resources. Update 11/03/24: No changes at this time Update 11/09/24: No changes at this time Update 11/14/24: APS drafting a petition for guardianship, awaiting updates regarding scheduled court date at this time. APS has received FL2 to begin placement search. 11/24/24 Update: No changes at this time.     Reason for Continuation of Hospitalization: Delusions  Medication stabilization 10/28/24 Update: Delusions  Medication stabilization  11/24/24 Update: No changes at this time.   Estimated Length of Stay: 1 to 7 days 10/23/24 Update: No changes at this time. 10/28/24 Update: TBD. Update 11/03/24: TBD Update 11/09/24: TBD Update 11/14/24: TBD 11/24/24 Update: TBD  Last 3 [place] Suicide Severity Risk Score: Flowsheet Row Admission (Current) from 10/17/2024 in [facility] from 10/14/2024 in [facility] ED to Hosp-Admission (Discharged) from 03/13/2024 in [place] [facility]  C-SSRS RISK CATEGORY No Risk No Risk No Risk    Last PHQ 2/9 Scores:     No data to display          Scribe for Treatment Team: Fred Franzen W Melek Pownall, LCSWA 11/24/2024 12:46 PM

## 2024-11-24 NOTE — Plan of Care (Signed)
  Problem: Activity: Goal: Interest or engagement in activities will improve Outcome: Progressing   Problem: Coping: Goal: Ability to demonstrate self-control will improve Outcome: Progressing   Problem: Health Behavior/Discharge Planning: Goal: Compliance with treatment plan for underlying cause of condition will improve Outcome: Progressing   

## 2024-11-24 NOTE — Group Note (Signed)
 Date:  11/24/2024 Time:  11:12 PM  Group Topic/Focus:  Wrap-Up Group:   The focus of this group is to help patients review their daily goal of treatment and discuss progress on daily workbooks.    Participation Level:  Active  Participation Quality:  Appropriate  Affect:  Appropriate  Cognitive:  Alert  Insight: Appropriate  Engagement in Group:  Engaged  Modes of Intervention:  Discussion  Additional Comments:    Shari [name] 11/24/2024, 11:12 PM

## 2024-11-24 NOTE — Progress Notes (Signed)
 [facility] MD Progress Note  11/24/2024 10:25 AM Shari [name]  MRN:  [id number] Patient is a 68 year old female with a history of Schizoaffective Disorder, Bipolar Type who presents via [facility] under IVC, initiated by CM, Falencio with re-entry program, to [facility] for assessment.    Subjective:  Chart reviewed, case discussed in multidisciplinary meeting, patient seen during rounds.    P.  Patient is very focused on getting discharged awaiting placement denies any current suicidal or homicidal thoughts Past Psychiatric History: see h&P Family History:  Family History  Problem Relation Age of Onset   Diabetes Mother    CAD Mother    Lung cancer Father    Social History:  Social History   Substance and Sexual Activity  Alcohol Use Yes   Comment: beer occasionally     Social History   Substance and Sexual Activity  Drug Use No    Social History   Socioeconomic History   Marital status: Widowed    Spouse name: Not on file   Number of children: Not on file   Years of education: Not on file   Highest education level: Not on file  Occupational History   Not on file  Tobacco Use   Smoking status: Every Day    Current packs/day: 0.50    Average packs/day: 0.5 packs/day for 40.0 years (20.0 ttl pk-yrs)    Types: Cigarettes   Smokeless tobacco: Never  Vaping Use   Vaping status: Never Used  Substance and Sexual Activity   Alcohol use: Yes    Comment: beer occasionally   Drug use: No   Sexual activity: Not Currently    Birth control/protection: Post-menopausal  Other Topics Concern   Not on file  Social History Narrative   Not on file   Social Drivers of Health   Tobacco Use: High Risk (11/02/2024)   Patient History    Smoking Tobacco Use: Every Day    Smokeless Tobacco Use: Never    Passive Exposure: Not on file  Financial Resource Strain: Not on file  Food Insecurity: No Food Insecurity (10/17/2024)   Epic    Worried About Programme Researcher, Broadcasting/film/video in  the Last Year: Never true    Ran Out of Food in the Last Year: Never true  Transportation Needs: No Transportation Needs (10/17/2024)   Epic    Lack of Transportation (Medical): No    Lack of Transportation (Non-Medical): No  Recent Concern: Transportation Needs - Unmet Transportation Needs (10/14/2024)   Epic    Lack of Transportation (Medical): Yes    Lack of Transportation (Non-Medical): Yes  Physical Activity: Not on file  Stress: Not on file  Social Connections: Unknown (10/17/2024)   Social Connection and Isolation Panel    Frequency of Communication with Friends and Family: Patient unable to answer    Frequency of Social Gatherings with Friends and Family: Patient unable to answer    Attends Religious Services: Patient unable to answer    Active Member of Clubs or Organizations: Patient declined    Attends Banker Meetings: Patient unable to answer    Marital Status: Widowed  Depression (EYV7-0): Not on file  Alcohol Screen: Low Risk (10/17/2024)   Alcohol Screen    Last Alcohol Screening Score (AUDIT): 0  Housing: Low Risk (10/17/2024)   Epic    Unable to Pay for Housing in the Last Year: No    Number of Times Moved in the Last Year: 0  Homeless in the Last Year: No  Utilities: Not At Risk (10/17/2024)   Epic    Threatened with loss of utilities: No  Health Literacy: Not on file   Past Medical History:  Past Medical History:  Diagnosis Date   Eye globe prosthesis    GERD (gastroesophageal reflux disease)    History of blood transfusion 1973   related to abscess burst in my stomach   Hyperlipemia    Hyperlipidemia    Hypertension    Osteoarthritis of back    Lowerback    SVD (spontaneous vaginal delivery)    x 1, baby died at 2 wks of age   Type II diabetes mellitus (HCC)     Past Surgical History:  Procedure Laterality Date   APPENDECTOMY     COLONOSCOPY     DILATATION & CURETTAGE/HYSTEROSCOPY WITH MYOSURE N/A 02/25/2015   Procedure:  DILATATION & CURETTAGE/HYSTEROSCOPY WITH MYOSURE;  Surgeon: Truman Corona, MD;  Location: [facility];  Service: Gynecology;  Laterality: N/A;   DILATION AND CURETTAGE OF UTERUS     ENUCLEATION Right 03/16/2017   ENUCLEATION Right 03/16/2017   Procedure: ENUCLEATION RIGHT EYE;  Surgeon: Loyd Kathryne Palm, MD;  Location: [facility] OR;  Service: Ophthalmology;  Laterality: Right;   EYE SURGERY     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6, 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   EYE SURGERY Right ~ 1974   S/P initial eye injury; scissors stuck in my eye   LAPAROSCOPIC CHOLECYSTECTOMY     SHOULDER ARTHROSCOPY WITH ROTATOR CUFF REPAIR Left    wire stitches per patient   SHOULDER ARTHROSCOPY WITH ROTATOR CUFF REPAIR Right     Current Medications: Current Facility-Administered Medications  Medication Dose Route Frequency Provider Last Rate Last Admin   acetaminophen  (TYLENOL ) tablet 650 mg  650 mg Oral Q6H PRN Tex Drilling, NP   650 mg at 11/23/24 2114   alum & mag hydroxide-simeth (MAALOX/MYLANTA) 200-200-20 MG/5ML suspension 30 mL  30 mL Oral Q4H PRN Tex Drilling, NP       cloNIDine  (CATAPRES ) tablet 0.1 mg  0.1 mg Oral BID PRN Tex Drilling, NP       divalproex  (DEPAKOTE ) DR tablet 1,500 mg  1,500 mg Oral QHS Jadapalle, Sree, MD   1,500 mg at 11/23/24 2108   divalproex  (DEPAKOTE ) DR tablet 500 mg  500 mg Oral q AM Jadapalle, Sree, MD   500 mg at 11/24/24 9075   losartan  (COZAAR ) tablet 25 mg  25 mg Oral Daily Nkwenti, Doris, NP   25 mg at 11/24/24 9075   magnesium  hydroxide (MILK OF MAGNESIA) suspension 30 mL  30 mL Oral Daily PRN Tex Drilling, NP       menthol  (CEPACOL) lozenge 3 mg  1 lozenge Oral PRN Bobbitt, Shalon E, NP   3 mg at 11/23/24 2120   methimazole  (TAPAZOLE ) tablet 10 mg  10 mg Oral Daily Tex Drilling, NP   10 mg at 11/24/24 9075   OLANZapine  (ZYPREXA ) injection 5 mg  5 mg Intramuscular TID PRN Tex Drilling, NP       OLANZapine  (ZYPREXA ) injection 5 mg  5 mg Intramuscular TID PRN Tex Drilling, NP        OLANZapine  (ZYPREXA ) tablet 20 mg  20 mg Oral QHS Jadapalle, Sree, MD   20 mg at 11/23/24 2108   OLANZapine  zydis (ZYPREXA ) disintegrating tablet 5 mg  5 mg Oral TID PRN Tex Drilling, NP       OLANZapine  zydis (ZYPREXA ) disintegrating tablet 5 mg  5  mg Oral TID PRN Tex Drilling, NP       pantoprazole  (PROTONIX ) EC tablet 40 mg  40 mg Oral Daily Nkwenti, Doris, NP   40 mg at 11/24/24 9075   traZODone  (DESYREL ) tablet 50 mg  50 mg Oral QHS PRN Tex Drilling, NP   50 mg at 11/22/24 2137    Lab Results:  No results found for this or any previous visit (from the past 48 hours).       Blood Alcohol level:  Lab Results  Component Value Date   [facility] <15 10/14/2024   ETH <10 07/02/2022    Metabolic Disorder Labs: Lab Results  Component Value Date   HGBA1C 5.5 10/14/2024   MPG 111.15 10/14/2024   MPG 137 01/06/2022   Lab Results  Component Value Date   PROLACTIN 5.8 10/14/2024   Lab Results  Component Value Date   CHOL 167 10/14/2024   TRIG 68 10/14/2024   HDL 60 10/14/2024   CHOLHDL 2.8 10/14/2024   VLDL 14 10/14/2024   LDLCALC 93 10/14/2024   LDLCALC 73 03/13/2024    Physical Findings: AIMS:  , ,  ,  ,    CIWA:    COWS:      Psychiatric Specialty Exam:  Presentation  General Appearance:  Appropriate for Environment  Eye Contact: Fleeting  Speech: Normal Rate  Speech Volume: Decreased    Mood and Affect  Mood:fine  Affect: Flat   Thought Process  Thought Processes: impoverished  Orientation:Partial  Thought Content: Chronic delusions Hallucinations: Denies  Ideas of Reference:Delusions; Paranoia  Suicidal Thoughts: Denies  Homicidal Thoughts: Denies   Sensorium  Memory: Immediate Fair; Recent Fair  Judgment: Impaired  Insight: Shallow   Executive Functions  Concentration: Fair  Attention Span: Fair  Recall: Poor  Fund of Knowledge: Fair  Language: Fair   Psychomotor Activity  Psychomotor Activity: No  data recorded  Musculoskeletal: Strength & Muscle Tone: within normal limits Gait & Station: normal Assets  Assets: Manufacturing Systems Engineer; Desire for Improvement; Social Support    Physical Exam: Physical Exam Vitals and nursing note reviewed.    ROS Blood pressure (!) 157/76, pulse 80, temperature 98.4 F (36.9 C), resp. rate 18, height 5' 8 (1.727 m), weight 48.3 kg, SpO2 99%. Body mass index is 16.19 kg/m.  Diagnosis: Principal Problem:   Schizophrenia, paranoid (HCC) Major neurocognitive disorder-slums total score of 13/30   Treatment Plan Summary: APS referral has been made as patient lacks capacity to make medical decisions.  Recommending legal guardianship and further placement at appropriate level of care  Safety and Monitoring:             -- Involuntary admission to inpatient psychiatric unit for safety, stabilization and treatment             -- Daily contact with patient to assess and evaluate symptoms and progress in treatment             -- Patient's case to be discussed in multi-disciplinary team meeting             -- Observation Level: q15 minute checks             -- Vital signs:  q12 hours             -- Precautions: suicide, elopement, and assault   2. Psychiatric Diagnoses and Treatment:  Check Depakote  levels 11/21/2024           Depakote  changed to 500 mg every morning and  1500 mg nightly-in next 5 days 11/13/2024 Depakote  level-44 Zyprexa  20 mg nightly BMP-within normal limits Occupational Therapy evaluation for assessing appropriate level of care -- The risks/benefits/side-effects/alternatives to this medication were discussed in detail with the patient and time was given for questions. The patient consents to medication trial.                -- Metabolic profile and EKG monitoring obtained while on an atypical antipsychotic (BMI: Lipid Panel: HbgA1c: QTc:)              -- Encouraged patient to participate in unit milieu and in scheduled group  therapies   Occupational Therapy recommendations If plan is discharge home, recommend the following:   Direct supervision/assist for medications management;Direct supervision/assist for financial management;Assist for transportation;Assistance with cooking/housework    Ms Yoon was seen for OT treatment on this date. Upon arrival to room pt in dayroom, agreeable to tx. The SLUMS is a 30-point screening questionnaire that tests orientation, memory, attention, problem solving, and executive function. Pt scored a 13/30 indicating Dementia. Of note, it is not within occupational therapy scope of practice to diagnose cognitive impairments, this screen indicates need for further testing. Pt with noted impairments in memory and problem solving limiting ability to participate functionally in medication management, bill management, and safety cooking. Will continue to follow POC. Continue to recommend SUPERVISION for safety with iADLs.    [facility] Mental Status Examination Orientation: 2/3  Calculations: 0/3 Naming animals: 2/3 Patient named 14 animals (0 points is 0-4 animals; 1 is 5-9 animals; 2 is 10-14 animals; 3 is 15+ animals) Recall: 3/5  Attention: 1/2 Clock drawing: 1/4 Visual Processing: 2/2 Paragraph Memory: 2/8  Total: 13/30;  Given that patient has completed high school, this score falls in the dementia range.   4. Discharge Planning:   -- Social work and case management to assist with discharge planning and identification of hospital follow-up needs prior to discharge  -- Estimated LOS: 3-4 days  Millie JONELLE Manners, MD 11/24/2024, 10:25 AM

## 2024-11-24 NOTE — Progress Notes (Signed)
 Behavior:   Mild confusion.  Pleasant and cooperative.    Psych assessment:  Denies SI/HI and AVH.  Interaction / Group attendance:  Present in the milieu.  Minimal interaction with peers and staff.  Attends groups.  Medication/ PRNs: Compliant.  Pain:  Denies.  15 min checks in place for safety.

## 2024-11-24 NOTE — BH IP Treatment Plan (Deleted)
 Interdisciplinary Treatment and Diagnostic Plan Update  11/24/2024 Time of Session:  Shari [name] MRN: [id number]  Principal Diagnosis: Schizophrenia, paranoid (HCC)  Secondary Diagnoses: Principal Problem:   Schizophrenia, paranoid (HCC)   Current Medications:  Current Facility-Administered Medications  Medication Dose Route Frequency Provider Last Rate Last Admin   acetaminophen  (TYLENOL ) tablet 650 mg  650 mg Oral Q6H PRN Tex Drilling, NP   650 mg at 11/23/24 2114   alum & mag hydroxide-simeth (MAALOX/MYLANTA) 200-200-20 MG/5ML suspension 30 mL  30 mL Oral Q4H PRN Tex Drilling, NP       cloNIDine  (CATAPRES ) tablet 0.1 mg  0.1 mg Oral BID PRN Tex Drilling, NP       divalproex  (DEPAKOTE ) DR tablet 1,500 mg  1,500 mg Oral QHS Jadapalle, Sree, MD   1,500 mg at 11/23/24 2108   divalproex  (DEPAKOTE ) DR tablet 500 mg  500 mg Oral q AM Jadapalle, Sree, MD   500 mg at 11/24/24 9075   losartan  (COZAAR ) tablet 25 mg  25 mg Oral Daily Nkwenti, Doris, NP   25 mg at 11/24/24 9075   magnesium  hydroxide (MILK OF MAGNESIA) suspension 30 mL  30 mL Oral Daily PRN Tex Drilling, NP       menthol  (CEPACOL) lozenge 3 mg  1 lozenge Oral PRN Bobbitt, Shalon E, NP   3 mg at 11/23/24 2120   methimazole  (TAPAZOLE ) tablet 10 mg  10 mg Oral Daily Nkwenti, Doris, NP   10 mg at 11/24/24 9075   OLANZapine  (ZYPREXA ) injection 5 mg  5 mg Intramuscular TID PRN Tex Drilling, NP       OLANZapine  (ZYPREXA ) injection 5 mg  5 mg Intramuscular TID PRN Tex Drilling, NP       OLANZapine  (ZYPREXA ) tablet 20 mg  20 mg Oral QHS Jadapalle, Sree, MD   20 mg at 11/23/24 2108   OLANZapine  zydis (ZYPREXA ) disintegrating tablet 5 mg  5 mg Oral TID PRN Tex Drilling, NP       OLANZapine  zydis (ZYPREXA ) disintegrating tablet 5 mg  5 mg Oral TID PRN Tex Drilling, NP       pantoprazole  (PROTONIX ) EC tablet 40 mg  40 mg Oral Daily Nkwenti, Doris, NP   40 mg at 11/24/24 9075   traZODone  (DESYREL ) tablet 50 mg  50 mg Oral QHS  PRN Tex Drilling, NP   50 mg at 11/22/24 2137   PTA Medications: Medications Prior to Admission  Medication Sig Dispense Refill Last Dose/Taking   Ascorbic Acid (VITAMIN C PO) Take 1 tablet by mouth daily. (Patient not taking: Reported on 10/14/2024)      divalproex  (DEPAKOTE ) 500 MG DR tablet Take 1 tablet (500 mg total) by mouth at bedtime. (Patient not taking: Reported on 10/14/2024) 30 tablet 1    losartan  (COZAAR ) 25 MG tablet Take 1 tablet (25 mg total) by mouth daily. (Patient not taking: Reported on 10/14/2024) 90 tablet 1    methimazole  (TAPAZOLE ) 10 MG tablet Take 1 tablet (10 mg total) by mouth daily. (Patient not taking: Reported on 10/14/2024) 90 tablet 1    OLANZapine  (ZYPREXA ) 10 MG tablet Take 1 tablet (10 mg total) by mouth at bedtime. (Patient not taking: Reported on 10/14/2024) 30 tablet 1    pantoprazole  (PROTONIX ) 40 MG tablet Take 1 tablet (40 mg total) by mouth daily. (Patient not taking: Reported on 10/14/2024) 90 tablet 0    propranolol  (INDERAL ) 10 MG tablet Take 1 tablet (10 mg total) by mouth 2 (two) times daily. (Patient not  taking: Reported on 10/14/2024) 180 tablet 1    VITAMIN D PO Take 1 tablet by mouth daily. (Patient not taking: Reported on 10/14/2024)      VITAMIN E PO Take 1 tablet by mouth daily. (Patient not taking: Reported on 10/14/2024)       Patient Stressors: Medication change or noncompliance    Patient Strengths: Communication skills   Treatment Modalities: Medication Management, Group therapy, Case management,  1 to 1 session with clinician, Psychoeducation, Recreational therapy.   Physician Treatment Plan for Primary Diagnosis: Schizophrenia, paranoid (HCC) Long Term Goal(s): Improvement in symptoms so as ready for discharge   Short Term Goals: Ability to identify changes in lifestyle to reduce recurrence of condition will improve Ability to verbalize feelings will improve Ability to disclose and discuss suicidal ideas Ability to  demonstrate self-control will improve Ability to identify and develop effective coping behaviors will improve Ability to maintain clinical measurements within normal limits will improve  Medication Management: Evaluate patient's response, side effects, and tolerance of medication regimen.  Therapeutic Interventions: 1 to 1 sessions, Unit Group sessions and Medication administration.  Evaluation of Outcomes: Progressing  Physician Treatment Plan for Secondary Diagnosis: Principal Problem:   Schizophrenia, paranoid (HCC)  Long Term Goal(s): Improvement in symptoms so as ready for discharge   Short Term Goals: Ability to identify changes in lifestyle to reduce recurrence of condition will improve Ability to verbalize feelings will improve Ability to disclose and discuss suicidal ideas Ability to demonstrate self-control will improve Ability to identify and develop effective coping behaviors will improve Ability to maintain clinical measurements within normal limits will improve     Medication Management: Evaluate patient's response, side effects, and tolerance of medication regimen.  Therapeutic Interventions: 1 to 1 sessions, Unit Group sessions and Medication administration.  Evaluation of Outcomes: Progressing   RN Treatment Plan for Primary Diagnosis: Schizophrenia, paranoid (HCC) Long Term Goal(s): Improvement in symptoms so is ready for discharge.  Short Term Goals: Ability to remain free from injury will improve, Ability to verbalize frustration and anger appropriately will improve, Ability to demonstrate self-control, Ability to participate in decision making will improve, Ability to verbalize feelings will improve, Ability to disclose and discuss suicidal ideas, Ability to identify and develop effective coping behaviors will improve, and Compliance with prescribed medications will improve  Medication Management: RN will administer medications as ordered by provider, will  assess and evaluate patient's response and provide education to patient for prescribed medication. RN will report any adverse and/or side effects to prescribing provider.  Therapeutic Interventions: 1 on 1 counseling sessions, Psychoeducation, Medication administration, Evaluate responses to treatment, Monitor vital signs and CBGs as ordered, Perform/monitor CIWA, COWS, AIMS and Fall Risk screenings as ordered, Perform wound care treatments as ordered.  Evaluation of Outcomes: Progressing   LCSW Treatment Plan for Primary Diagnosis: Schizophrenia, paranoid (HCC) Long Term Goal(s): Safe transition to appropriate next level of care at discharge, Engage patient in therapeutic group addressing interpersonal concerns.  Short Term Goals: Engage patient in aftercare planning with referrals and resources, Increase social support, Increase ability to appropriately verbalize feelings, Increase emotional regulation, Facilitate acceptance of mental health diagnosis and concerns, Facilitate patient progression through stages of change regarding substance use diagnoses and concerns, Identify triggers associated with mental health/substance abuse issues, and Increase skills for wellness and recovery  Therapeutic Interventions: Assess for all discharge needs, 1 to 1 time with Social worker, Explore available resources and support systems, Assess for adequacy in community support network, Educate family  and significant other(s) on suicide prevention, Complete Psychosocial Assessment, Interpersonal group therapy.  Evaluation of Outcomes: Progressing   Progress in Treatment: Attending groups: No. Participating in groups: Yes. Taking medication as prescribed: Yes. Toleration medication: Yes. Family/Significant other contact made: No, will contact: If given permission. Patient understands diagnosis: No. Discussing patient identified problems/goals with staff: No. Medical problems stabilized or resolved:  No. Denies suicidal/homicidal ideation: Yes. Issues/concerns per patient self-inventory: No. Other: none  New problem(s) identified: None identified 10/23/24 Update: No changes at this time. 10/28/24 Update: No changes at this time. Update 11/03/24: No changes at this time Update 11/09/24: No changes at this time Update 11/14/24: No changes at this time Update 11/24/24.  New Short Term/Long Term Goal(s): elimination of symptoms of psychosis, medication management for mood stabilization; elimination of SI thoughts; development of comprehensive mental wellness plan. 10/23/24 Update: No changes at this time. 10/28/24 Update: No changes at this time. Update 11/03/24: No changes at this time Update 11/09/24: No changes at this time Update 11/14/24: No changes at this time Update 11/24/24    Patient Goals:   I haven't set any goals for the hospital yet. I don't need psychiatric help 10/23/24 Update: No changes at this time. 10/28/24 Update: No changes at this time. Update 11/03/24: No changes at this time Update 11/09/24: No changes at this time Update 11/14/24: No changes at this time. Update 11/24/24  Discharge Plan or Barriers: CSW will assist with appropriate discharge plan 10/23/24 Update: No changes at this time. 10/28/24 Update: CSW to continue to meet with DSS to engage in safe discharge planning and connect patient with appropriate resources. Update 11/03/24: No changes at this time Update 11/09/24: No changes at this time Update 11/14/24: APS drafting a petition for guardianship, awaiting updates regarding scheduled court date at this time. APS has received FL2 to begin placement search. Update   Reason for Continuation of Hospitalization: Delusions Medication stabilization 10/28/24 Update: Delusions 11/24/24 Delusion  Estimated Length of Stay:1 to 7 days 10/23/24 Update: No changes at this time. 10/28/24 Update: TBD. Update 11/03/24: TBD Update 11/09/24: TBD Update 11/14/24: TBD Update  11/24/24: TBD  Last 3 [place] Suicide Severity Risk Score: Flowsheet Row Admission (Current) from 10/17/2024 in [facility] from 10/14/2024 in [facility] ED to Hosp-Admission (Discharged) from 03/13/2024 in [place] [facility]  C-SSRS RISK CATEGORY No Risk No Risk No Risk    Last PHQ 2/9 Scores:     No data to display          Scribe for Treatment Team: Rexene LELON Chesley ISRAEL 11/24/2024 10:01 AM

## 2024-11-25 DIAGNOSIS — F0390 Unspecified dementia without behavioral disturbance: Secondary | ICD-10-CM | POA: Diagnosis not present

## 2024-11-25 DIAGNOSIS — F200 Paranoid schizophrenia: Secondary | ICD-10-CM | POA: Diagnosis not present

## 2024-11-25 NOTE — Group Note (Signed)
 Date:  11/25/2024 Time:  4:52 PM  Group Topic/Focus:  Building Self Esteem:   The Focus of this group is helping patients become aware of the effects of self-esteem on their lives, the things they and others do that enhance or undermine their self-esteem, seeing the relationship between their level of self-esteem and the choices they make and learning ways to enhance self-esteem.    Participation Level:  Active  Participation Quality:  Appropriate  Affect:  Appropriate  Cognitive:  Appropriate  Insight: Appropriate  Engagement in Group:  Engaged  Modes of Intervention:  Discussion   [name] 11/25/2024, 4:52 PM

## 2024-11-25 NOTE — Progress Notes (Signed)
 Occupational Therapy Treatment Patient Details Name: Shari [name] MRN: [id number] DOB: Mar 06, 1956 Today's Date: 11/25/2024   History of present illness 68 year old female with a history of Schizoaffective Disorder, Bipolar Type who presents via GPD under IVC. Per IVC, pt refused medications, wasn't sleeping, responding to internal stimuli, verbally aggressive.   OT comments  Pt seen for OT treatment session this date. Pt pleasant, agreeable to work on household task performance, A&Ox3. Pt participates in the Performance Assessment of Self-Care Skills (PASS) IADL task of changing bed linens. Pt performs task independently under scoring criteria. Pt able to demonstrate physical ability of folding sheets, picking up items from floor, makes bed without loss of balance or tripping. She correctly removes bed sheets and replaces with fresh linens, adjusting sheet precisely and smoothing out any creases in fabric. Although pt performed task independently, please see previous OT notes with documentation of impaired cognition (pt recently scored 13/30 on SLUMS and received a score of FAIL on the Pill Box Test). OT will continue to follow, continue to recommend SUPERVISION for IADL mgmt.       If plan is discharge home, recommend the following:  Direct supervision/assist for medications management;Direct supervision/assist for financial management;Assist for transportation;Assistance with cooking/housework   Equipment Recommendations  None recommended by OT       Precautions / Restrictions Precautions Precautions: Fall Recall of Precautions/Restrictions: Intact Restrictions Weight Bearing Restrictions Per Provider Order: No       Mobility Bed Mobility Overal bed mobility: Independent                  Transfers Overall transfer level: Independent Equipment used: None                     Balance Overall balance assessment: No apparent balance deficits (not formally assessed)                                          ADL either performed or assessed with clinical judgement   ADL Overall ADL's : Independent                                             Communication Communication Communication: No apparent difficulties   Cognition Arousal: Alert Behavior During Therapy: WFL for tasks assessed/performed Cognition: No family/caregiver present to determine baseline             OT - Cognition Comments: A&Ox3                 Following commands: Intact        Cueing   Cueing Techniques: Verbal cues  Exercises Exercises: Other exercises Other Exercises Other Exercises: PASS IADL assessment (Heavy Housework), pt scored INDEPENDENT    Shoulder Instructions       General Comments      Pertinent Vitals/ Pain       Pain Assessment Pain Assessment: No/denies pain   Frequency  Min 1X/week        Progress Toward Goals  OT Goals(current goals can now be found in the care plan section)  Progress towards OT goals: Progressing toward goals      AM-PAC OT 6 Clicks Daily Activity     Outcome Measure   Help from another person  eating meals?: None Help from another person taking care of personal grooming?: None Help from another person toileting, which includes using toliet, bedpan, or urinal?: None Help from another person bathing (including washing, rinsing, drying)?: None Help from another person to put on and taking off regular upper body clothing?: None Help from another person to put on and taking off regular lower body clothing?: None 6 Click Score: 24    End of Session    OT Visit Diagnosis: Other symptoms and signs involving cognitive function   Activity Tolerance Patient tolerated treatment well   Patient Left Other (comment);in chair (in chair, in room)   Nurse Communication Mobility status        Time: 8788-8769 OT Time Calculation (min): 19 min  Charges: OT General  Charges $OT Visit: 1 Visit OT Treatments $Self Care/Home Management : 8-22 mins  Kaelyn Nauta L. Areli Frary, OTR/L  11/25/2024, 1:13 PM

## 2024-11-25 NOTE — Group Note (Signed)
 Recreation Therapy Group Note   Group Topic:Healthy Support Systems  Group Date: 11/25/2024 Start Time: 1400 End Time: 1435 Facilitators: Celestia Jeoffrey BRAVO, LRT, CTRS Location: Dayroom  Group Description: Straw Bridge. In groups or individually, patients were given 10 plastic drinking straws and an equal length of masking tape. Using the materials provided, patients were instructed to build a free-standing bridge-like structure to suspend an everyday item (ex: deck of cards) off the floor or table surface. All materials were required to be used in secondary school teacher. LRT facilitated post-activity discussion reviewing the importance of having strong and healthy support systems in our lives. LRT discussed how the people in our lives serve as the tape and the deck of cards we placed on top of our straw structure are the stressors we face in daily life. LRT and pts discussed what happens in our life when things get too heavy for us , and we don't have strong supports outside of the hospital. Pt shared 2 of their healthy supports in their life aloud in the group.   Goal Area(s) Addressed:  Patient will identify 2 healthy supports in their life. Patient will identify skills to successfully complete activity. Patient will identify correlation of this activity to life post-discharge.  Patient will build on frustration tolerance skills. Patient will increase team building and communication skills.    Affect/Mood: Appropriate   Participation Level: Active and Engaged   Participation Quality: Independent   Behavior: Calm and Cooperative   Speech/Thought Process: Directed   Insight: Good   Judgement: Good   Modes of Intervention: STEM Activity   Patient Response to Interventions:  Attentive, Engaged, and Receptive   Education Outcome:  Acknowledges education   Clinical Observations/Individualized Feedback: Shari [name] was active in their participation of session activities and group discussion. Pt  identified myself and my sister in [place] as healthy supports.    Plan: Continue to engage patient in RT group sessions 2-3x/week.   Jeoffrey BRAVO Celestia, LRT, CTRS 11/25/2024 4:42 PM

## 2024-11-25 NOTE — Plan of Care (Signed)
   Problem: Education: Goal: Knowledge of [facility] Education information/materials will improve Outcome: Progressing Goal: Emotional status will improve Outcome: Progressing Goal: Mental status will improve Outcome: Progressing Goal: Verbalization of understanding the information provided will improve Outcome: Progressing

## 2024-11-25 NOTE — Progress Notes (Signed)
" °   11/24/24 2000  Psych Admission Type (Psych Patients Only)  Admission Status Involuntary  Psychosocial Assessment  Patient Complaints None  Eye Contact Fair  Facial Expression Flat  Affect Appropriate to circumstance  Speech Logical/coherent;Soft  Interaction Minimal  Motor Activity Slow  Appearance/Hygiene Unremarkable  Behavior Characteristics Cooperative;Appropriate to situation  Mood Pleasant  Thought Process  Coherency WDL  Content WDL  Delusions None reported or observed  Perception WDL  Hallucination None reported or observed  Judgment Impaired  Confusion Mild  Danger to Self  Current suicidal ideation? Denies  Self-Injurious Behavior No self-injurious ideation or behavior indicators observed or expressed   Agreement Not to Harm Self Yes  Description of Agreement verbal  Danger to Others  Danger to Others None reported or observed   Mood/Behavior:  Pleasant and cooperative.    Psych assessment: Denies SI/HI and AVH.     Interaction / Group attendance:  Present in the milieu. Minimal interaction with peers and staff.  Attended group.   Medication/ PRNs: Compliant with scheduled medications. Required PRNs Trazodone  for sleep and noted effective. Required prn Tylenol  for shoulder pain noted effective.    Pain: Denies   15 min checks in place for safety. "

## 2024-11-25 NOTE — Progress Notes (Signed)
 D Alert and Oriented x 2 reoriented to situation denies SI/HI/A/VH and verbally contracts for safety.  A Scheduled medications administered per Provider order. Support and encouragement provided. Routine safety checks conducted every 15 minutes. Patient notified to inform staff with problems or concerns.  R. No adverse drug reactions noted. Will continue to monitor Patient.

## 2024-11-25 NOTE — Group Note (Signed)
 Date:  11/25/2024 Time:  2:10 PM  Group Topic/Focus:  Healthy Communication:   The focus of this group is to discuss communication, barriers to communication, as well as healthy ways to communicate with others. Making Healthy Choices:   The focus of this group is to help patients identify negative/unhealthy choices they were using prior to admission and identify positive/healthier coping strategies to replace them upon discharge. Managing Feelings:   The focus of this group is to identify what feelings patients have difficulty handling and develop a plan to handle them in a healthier way upon discharge. Wellness Toolbox:   The focus of this group is to discuss various aspects of wellness, balancing those aspects and exploring ways to increase the ability to experience wellness.  Patients will create a wellness toolbox for use upon discharge.  The group completed worksheets focused on expressing how each patient was feeling, followed by a positive Would You Rather activity. Patients also participated in a Complete the Sentence exercise, a simple crossword, and brain teasers to encourage interaction and engagement. The group discussed healthy ways to cope with daily stressors and practiced sharing coping strategies.  Participation Level:  Active  Participation Quality:  Appropriate  Affect:  Appropriate  Cognitive:  Appropriate  Insight: Appropriate  Engagement in Group:  Engaged  Modes of Intervention:  Activity and Discussion  Additional Comments:    Gerrick Ray L Barry Faircloth 11/25/2024, 2:10 PM

## 2024-11-25 NOTE — Progress Notes (Signed)
 [facility] MD Progress Note  11/25/2024 12:24 PM Shari [name]  MRN:  [id number] Patient is a 68 year old female with a history of Schizoaffective Disorder, Bipolar Type who presents via GPD under IVC, initiated by CM, Falencio with re-entry program, to [facility] for assessment. Per IVC, [name] has been diagnosed with Schizophrenia and is refusing to take medication. [name] isn't sleeping. Residents report her screaming and yelling all night long.  [name] responding to internal stimuli.  [name] verbally aggressive.  The respondent has locked all of the residents inside of the transitional housing home.  They could not leave the home which prompted the IVC.  Residents did not have access to food, the restroom or anything as a result of being locked in.  [name] is currently under Adult protective services care. Patient is admitted to [facility] unit with Q15 min safety monitoring. Multidisciplinary team approach is offered. Medication management; group/milieu therapy is offered.   Subjective:  Chart reviewed, case discussed in multidisciplinary meeting, patient seen during rounds.   Patient is noted to be sitting in the day area.  She offers no complaints.  She expressed her frustration about wanting to go back to her house.  She reports that she is coming from her own house and not a transitional house.  She reports that she does not work with APS and reports that she has no legal guardian and makes her own decisions.  Patient was educated by the team about her having guardianship and waiting for placement.  Patient is not responding to internal stimuli.  Patient denies SI/HI/plan.  [name], DSS caseworker, [phone]  was granted ex-parte until guardianship is granted       Able to understand medical problem-patient remains paranoid and delusional and has no understanding of the events that led up to her admission including her housing situation which she continues to  claim as her own house  Able to understand proposed treatment-patient has no understanding of the treatment options and medications that are proposed to her continues to claim that she does not require any psychotropics   Able to understand alternative to proposed treatment (if any)-no   Able to understand option of refusing proposed treatment (including withholding or withdrawing proposed treatment)-no   Able to appreciate reasonably foreseeable consequences of accepting proposed treatment-no  After thorough assessment, it is our clinical opinion-  Capacity is not competency. Competency is determined by legal system and judge. Capacity can vary from time to time depending on the mental status of the patient.  Past Psychiatric History: see h&P Family History:  Family History  Problem Relation Age of Onset   Diabetes Mother    CAD Mother    Lung cancer Father    Social History:  Social History   Substance and Sexual Activity  Alcohol Use Yes   Comment: beer occasionally     Social History   Substance and Sexual Activity  Drug Use No    Social History   Socioeconomic History   Marital status: Widowed    Spouse name: Not on file   Number of children: Not on file   Years of education: Not on file   Highest education level: Not on file  Occupational History   Not on file  Tobacco Use   Smoking status: Every Day    Current packs/day: 0.50    Average packs/day: 0.5 packs/day for 40.0 years (20.0 ttl pk-yrs)    Types: Cigarettes   Smokeless tobacco:  Never  Vaping Use   Vaping status: Never Used  Substance and Sexual Activity   Alcohol use: Yes    Comment: beer occasionally   Drug use: No   Sexual activity: Not Currently    Birth control/protection: Post-menopausal  Other Topics Concern   Not on file  Social History Narrative   Not on file   Social Drivers of Health   Tobacco Use: High Risk (11/02/2024)   Patient History    Smoking Tobacco Use: Every Day     Smokeless Tobacco Use: Never    Passive Exposure: Not on file  Financial Resource Strain: Not on file  Food Insecurity: No Food Insecurity (10/17/2024)   Epic    Worried About Programme Researcher, Broadcasting/film/video in the Last Year: Never true    Ran Out of Food in the Last Year: Never true  Transportation Needs: No Transportation Needs (10/17/2024)   Epic    Lack of Transportation (Medical): No    Lack of Transportation (Non-Medical): No  Recent Concern: Transportation Needs - Unmet Transportation Needs (10/14/2024)   Epic    Lack of Transportation (Medical): Yes    Lack of Transportation (Non-Medical): Yes  Physical Activity: Not on file  Stress: Not on file  Social Connections: Unknown (10/17/2024)   Social Connection and Isolation Panel    Frequency of Communication with Friends and Family: Patient unable to answer    Frequency of Social Gatherings with Friends and Family: Patient unable to answer    Attends Religious Services: Patient unable to answer    Active Member of Clubs or Organizations: Patient declined    Attends Banker Meetings: Patient unable to answer    Marital Status: Widowed  Depression (PHQ2-9): Not on file  Alcohol Screen: Low Risk (10/17/2024)   Alcohol Screen    Last Alcohol Screening Score (AUDIT): 0  Housing: Low Risk (10/17/2024)   Epic    Unable to Pay for Housing in the Last Year: No    Number of Times Moved in the Last Year: 0    Homeless in the Last Year: No  Utilities: Not At Risk (10/17/2024)   Epic    Threatened with loss of utilities: No  Health Literacy: Not on file   Past Medical History:  Past Medical History:  Diagnosis Date   Eye globe prosthesis    GERD (gastroesophageal reflux disease)    History of blood transfusion 1973   related to abscess burst in my stomach   Hyperlipemia    Hyperlipidemia    Hypertension    Osteoarthritis of back    Lowerback    SVD (spontaneous vaginal delivery)    x 1, baby died at 2 wks of age   Type  II diabetes mellitus (HCC)     Past Surgical History:  Procedure Laterality Date   APPENDECTOMY     COLONOSCOPY     DILATATION & CURETTAGE/HYSTEROSCOPY WITH MYOSURE N/A 02/25/2015   Procedure: DILATATION & CURETTAGE/HYSTEROSCOPY WITH MYOSURE;  Surgeon: Truman Corona, MD;  Location: [facility];  Service: Gynecology;  Laterality: N/A;   DILATION AND CURETTAGE OF UTERUS     ENUCLEATION Right 03/16/2017   ENUCLEATION Right 03/16/2017   Procedure: ENUCLEATION RIGHT EYE;  Surgeon: Loyd Kathryne Palm, MD;  Location: [facility] OR;  Service: Ophthalmology;  Laterality: Right;   EYE SURGERY     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6, 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   EYE SURGERY Right ~ 1974   S/P initial eye injury; scissors stuck  in my eye   LAPAROSCOPIC CHOLECYSTECTOMY     SHOULDER ARTHROSCOPY WITH ROTATOR CUFF REPAIR Left    wire stitches per patient   SHOULDER ARTHROSCOPY WITH ROTATOR CUFF REPAIR Right     Current Medications: Current Facility-Administered Medications  Medication Dose Route Frequency Provider Last Rate Last Admin   acetaminophen  (TYLENOL ) tablet 650 mg  650 mg Oral Q6H PRN Tex Drilling, NP   650 mg at 11/24/24 2130   alum & mag hydroxide-simeth (MAALOX/MYLANTA) 200-200-20 MG/5ML suspension 30 mL  30 mL Oral Q4H PRN Tex Drilling, NP       cloNIDine  (CATAPRES ) tablet 0.1 mg  0.1 mg Oral BID PRN Tex Drilling, NP       divalproex  (DEPAKOTE ) DR tablet 1,500 mg  1,500 mg Oral QHS Rithy Mandley, MD   1,500 mg at 11/24/24 2129   divalproex  (DEPAKOTE ) DR tablet 500 mg  500 mg Oral q AM Koleson Reifsteck, MD   500 mg at 11/25/24 0900   losartan  (COZAAR ) tablet 25 mg  25 mg Oral Daily Nkwenti, Doris, NP   25 mg at 11/25/24 0904   magnesium  hydroxide (MILK OF MAGNESIA) suspension 30 mL  30 mL Oral Daily PRN Tex Drilling, NP       menthol  (CEPACOL) lozenge 3 mg  1 lozenge Oral PRN Bobbitt, Shalon E, NP   3 mg at 11/24/24 2130   methimazole  (TAPAZOLE ) tablet 10 mg  10 mg Oral Daily Nkwenti, Doris, NP   10 mg at  11/25/24 9043   OLANZapine  (ZYPREXA ) injection 5 mg  5 mg Intramuscular TID PRN Tex Drilling, NP       OLANZapine  (ZYPREXA ) injection 5 mg  5 mg Intramuscular TID PRN Tex Drilling, NP       OLANZapine  (ZYPREXA ) tablet 20 mg  20 mg Oral QHS Doug Bucklin, MD   20 mg at 11/24/24 2130   OLANZapine  zydis (ZYPREXA ) disintegrating tablet 5 mg  5 mg Oral TID PRN Tex Drilling, NP       OLANZapine  zydis (ZYPREXA ) disintegrating tablet 5 mg  5 mg Oral TID PRN Tex Drilling, NP       pantoprazole  (PROTONIX ) EC tablet 40 mg  40 mg Oral Daily Nkwenti, Doris, NP   40 mg at 11/25/24 9095   traZODone  (DESYREL ) tablet 50 mg  50 mg Oral QHS PRN Tex Drilling, NP   50 mg at 11/24/24 2130    Lab Results:  No results found for this or any previous visit (from the past 48 hours).       Blood Alcohol level:  Lab Results  Component Value Date   [facility] <15 10/14/2024   ETH <10 07/02/2022    Metabolic Disorder Labs: Lab Results  Component Value Date   HGBA1C 5.5 10/14/2024   MPG 111.15 10/14/2024   MPG 137 01/06/2022   Lab Results  Component Value Date   PROLACTIN 5.8 10/14/2024   Lab Results  Component Value Date   CHOL 167 10/14/2024   TRIG 68 10/14/2024   HDL 60 10/14/2024   CHOLHDL 2.8 10/14/2024   VLDL 14 10/14/2024   LDLCALC 93 10/14/2024   LDLCALC 73 03/13/2024    Physical Findings: AIMS:  , ,  ,  ,    CIWA:    COWS:      Psychiatric Specialty Exam:  Presentation  General Appearance:  Appropriate for Environment  Eye Contact: Fleeting  Speech: Normal Rate  Speech Volume: Decreased    Mood and Affect  Mood:fine  Affect:  Flat   Thought Process  Thought Processes: impoverished  Orientation:Partial  Thought Content: Chronic delusions Hallucinations: Denies  Ideas of Reference:Delusions; Paranoia  Suicidal Thoughts: Denies  Homicidal Thoughts: Denies   Sensorium  Memory: Immediate Fair; Recent  Fair  Judgment: Impaired  Insight: Shallow   Executive Functions  Concentration: Fair  Attention Span: Fair  Recall: Poor  Fund of Knowledge: Fair  Language: Fair   Psychomotor Activity  Psychomotor Activity: No data recorded  Musculoskeletal: Strength & Muscle Tone: within normal limits Gait & Station: normal Assets  Assets: Manufacturing Systems Engineer; Desire for Improvement; Social Support    Physical Exam: Physical Exam Vitals and nursing note reviewed.    ROS Blood pressure 139/75, pulse 88, temperature (!) 97.4 F (36.3 C), resp. rate 18, height 5' 8 (1.727 m), weight 48.3 kg, SpO2 100%. Body mass index is 16.19 kg/m.  Diagnosis: Principal Problem:   Schizophrenia, paranoid (HCC) Major neurocognitive disorder-slums total score of 13/30   Treatment Plan Summary: APS referral has been made as patient lacks capacity to make medical decisions.  Recommending legal guardianship and further placement at appropriate level of care  Safety and Monitoring:             -- Involuntary admission to inpatient psychiatric unit for safety, stabilization and treatment             -- Daily contact with patient to assess and evaluate symptoms and progress in treatment             -- Patient's case to be discussed in multi-disciplinary team meeting             -- Observation Level: q15 minute checks             -- Vital signs:  q12 hours             -- Precautions: suicide, elopement, and assault   2. Psychiatric Diagnoses and Treatment:  Check Depakote  levels 11/21/2024           Depakote  changed to 500 mg every morning and 1500 mg nightly-in next 5 days 11/13/2024 Depakote  level-44 Zyprexa  20 mg nightly BMP-within normal limits Occupational Therapy evaluation for assessing appropriate level of care -- The risks/benefits/side-effects/alternatives to this medication were discussed in detail with the patient and time was given for questions. The patient consents to  medication trial.                -- Metabolic profile and EKG monitoring obtained while on an atypical antipsychotic (BMI: Lipid Panel: HbgA1c: QTc:)              -- Encouraged patient to participate in unit milieu and in scheduled group therapies   Occupational Therapy recommendations If plan is discharge home, recommend the following:   Direct supervision/assist for medications management;Direct supervision/assist for financial management;Assist for transportation;Assistance with cooking/housework    Ms Pacholski was seen for OT treatment on this date. Upon arrival to room pt in dayroom, agreeable to tx. The SLUMS is a 30-point screening questionnaire that tests orientation, memory, attention, problem solving, and executive function. Pt scored a 13/30 indicating Dementia. Of note, it is not within occupational therapy scope of practice to diagnose cognitive impairments, this screen indicates need for further testing. Pt with noted impairments in memory and problem solving limiting ability to participate functionally in medication management, bill management, and safety cooking. Will continue to follow POC. Continue to recommend SUPERVISION for safety with iADLs.    [facility]  Mental Status Examination Orientation: 2/3  Calculations: 0/3 Naming animals: 2/3 Patient named 14 animals (0 points is 0-4 animals; 1 is 5-9 animals; 2 is 10-14 animals; 3 is 15+ animals) Recall: 3/5  Attention: 1/2 Clock drawing: 1/4 Visual Processing: 2/2 Paragraph Memory: 2/8  Total: 13/30;  Given that patient has completed high school, this score falls in the dementia range.   4. Discharge Planning:   -- Social work and case management to assist with discharge planning and identification of hospital follow-up needs prior to discharge  -- Estimated LOS: 3-4 days  Allyn Foil, MD 11/25/2024, 12:24 PM

## 2024-11-25 NOTE — Group Note (Signed)
 Physical/Occupational Therapy Group Note  Group Topic: Transfer Training   Group Date: 11/25/2024 Start Time: 1315 End Time: 1340 Facilitators: Bria Sparr, Alm Hamilton, PT   Group Description: Group educated on sequence and techniques to maximize safety with functional transfers.  Additionally, integrated education on impact of seating surfaces, use of assistive device and management of orthostasis with movement transitions.  Patients actively engaged with functional transfers (sit/stand) from various seating surfaces, with and without assist devices, working to integrate and retain education provided during session.  Allowed time for questions and further discussion on mobility concerns/needs.   Therapeutic Goal(s):  Identify and demonstrate safe technique for sit/stand transfers from various seating surfaces. Identify and demonstrate safe use of assistive devices with basic transfers and simple mobility. Identify and demonstrate ability to recognize signs/symptoms of orthostasis and appropriate compensatory/safety techniques.  Individual Participation: Did not attend   Participation Level:   Participation Quality:   Behavior:   Speech/Thought Process:   Affect/Mood:   Insight:   Judgement:   Individualization:   Modes of Intervention:   Patient Response to Interventions:    Plan: Continue to engage patient in PT/OT groups 1 - 2x/week.  CHARM Hamilton Bertin PT, DPT 11/25/2024, 1:54 PM

## 2024-11-25 NOTE — Progress Notes (Signed)
" °   11/25/24 2200  Psych Admission Type (Psych Patients Only)  Admission Status Involuntary  Psychosocial Assessment  Patient Complaints None  Eye Contact Fair  Facial Expression Flat  Affect Appropriate to circumstance  Speech Logical/coherent;Soft  Interaction Needy;Minimal  Motor Activity Slow  Appearance/Hygiene Unremarkable  Behavior Characteristics Cooperative;Appropriate to situation  Mood Pleasant  Thought Process  Coherency WDL  Content WDL  Delusions None reported or observed  Perception WDL  Hallucination None reported or observed  Judgment Impaired  Confusion Mild  Danger to Self  Current suicidal ideation? Denies  Self-Injurious Behavior No self-injurious ideation or behavior indicators observed or expressed   Agreement Not to Harm Self Yes  Description of Agreement verbal  Danger to Others  Danger to Others None reported or observed   Mood/Behavior:  Pleasant and cooperative.    Psych assessment: Denies SI/HI and AVH.     Interaction / Group attendance:  Present in the milieu. Minimal interaction with peers and staff.  Attended group.   Medication/ PRNs: Compliant with scheduled medications. Required PRNs Trazodone  for sleep and noted effective. Required prn Tylenol  for shoulder pain noted effective.    Pain: Denies   15 min checks in place for safety. "

## 2024-11-25 NOTE — Plan of Care (Signed)

## 2024-11-26 DIAGNOSIS — F200 Paranoid schizophrenia: Secondary | ICD-10-CM | POA: Diagnosis not present

## 2024-11-26 DIAGNOSIS — F0390 Unspecified dementia without behavioral disturbance: Secondary | ICD-10-CM | POA: Diagnosis not present

## 2024-11-26 NOTE — Progress Notes (Signed)
 [facility] MD Progress Note  11/26/2024 4:29 PM Shari [name]  MRN:  [id number] Patient is a 68 year old female with a history of Schizoaffective Disorder, Bipolar Type who presents via [employer] under IVC, initiated by CM, Falencio with re-entry program, to [facility] for assessment. Per IVC, [name] has been diagnosed with Schizophrenia and is refusing to take medication. [name] isn't sleeping. Residents report her screaming and yelling all night long.  [name] responding to internal stimuli.  [name] verbally aggressive.  The respondent has locked all of the residents inside of the transitional housing home.  They could not leave the home which prompted the IVC.  Residents did not have access to food, the restroom or anything as a result of being locked in.  [name] is currently under Adult protective services care. Patient is admitted to [facility] unit with Q15 min safety monitoring. Multidisciplinary team approach is offered. Medication management; group/milieu therapy is offered.   Subjective:  Chart reviewed, case discussed in multidisciplinary meeting, patient seen during rounds.   Today on interview patient is noted to be sitting in the day area.  She remains discharge focused and remains frustrated about not having any updates on discharge planning.  Provider did explain that APS is looking into appropriate placement and due to holidays DSS is not giving any updates to the team.  Patient expressed her understanding.  She denies SI/HI/plan and denies hallucinations.  [name], DSS caseworker, [phone]  was granted ex-parte until guardianship is granted       Able to understand medical problem-patient remains paranoid and delusional and has no understanding of the events that led up to her admission including her housing situation which she continues to claim as her own house  Able to understand proposed treatment-patient has no understanding of the treatment options  and medications that are proposed to her continues to claim that she does not require any psychotropics   Able to understand alternative to proposed treatment (if any)-no   Able to understand option of refusing proposed treatment (including withholding or withdrawing proposed treatment)-no   Able to appreciate reasonably foreseeable consequences of accepting proposed treatment-no  After thorough assessment, it is our clinical opinion-  Capacity is not competency. Competency is determined by legal system and judge. Capacity can vary from time to time depending on the mental status of the patient.  Past Psychiatric History: see h&P Family History:  Family History  Problem Relation Age of Onset   Diabetes Mother    CAD Mother    Lung cancer Father    Social History:  Social History   Substance and Sexual Activity  Alcohol Use Yes   Comment: beer occasionally     Social History   Substance and Sexual Activity  Drug Use No    Social History   Socioeconomic History   Marital status: Widowed    Spouse name: Not on file   Number of children: Not on file   Years of education: Not on file   Highest education level: Not on file  Occupational History   Not on file  Tobacco Use   Smoking status: Every Day    Current packs/day: 0.50    Average packs/day: 0.5 packs/day for 40.0 years (20.0 ttl pk-yrs)    Types: Cigarettes   Smokeless tobacco: Never  Vaping Use   Vaping status: Never Used  Substance and Sexual Activity   Alcohol use: Yes    Comment: beer occasionally   Drug  use: No   Sexual activity: Not Currently    Birth control/protection: Post-menopausal  Other Topics Concern   Not on file  Social History Narrative   Not on file   Social Drivers of Health   Tobacco Use: High Risk (11/02/2024)   Patient History    Smoking Tobacco Use: Every Day    Smokeless Tobacco Use: Never    Passive Exposure: Not on file  Financial Resource Strain: Not on file  Food  Insecurity: No Food Insecurity (10/17/2024)   Epic    Worried About Programme Researcher, Broadcasting/film/video in the Last Year: Never true    Ran Out of Food in the Last Year: Never true  Transportation Needs: No Transportation Needs (10/17/2024)   Epic    Lack of Transportation (Medical): No    Lack of Transportation (Non-Medical): No  Recent Concern: Transportation Needs - Unmet Transportation Needs (10/14/2024)   Epic    Lack of Transportation (Medical): Yes    Lack of Transportation (Non-Medical): Yes  Physical Activity: Not on file  Stress: Not on file  Social Connections: Unknown (10/17/2024)   Social Connection and Isolation Panel    Frequency of Communication with Friends and Family: Patient unable to answer    Frequency of Social Gatherings with Friends and Family: Patient unable to answer    Attends Religious Services: Patient unable to answer    Active Member of Clubs or Organizations: Patient declined    Attends Banker Meetings: Patient unable to answer    Marital Status: Widowed  Depression (PHQ2-9): Not on file  Alcohol Screen: Low Risk (10/17/2024)   Alcohol Screen    Last Alcohol Screening Score (AUDIT): 0  Housing: Low Risk (10/17/2024)   Epic    Unable to Pay for Housing in the Last Year: No    Number of Times Moved in the Last Year: 0    Homeless in the Last Year: No  Utilities: Not At Risk (10/17/2024)   Epic    Threatened with loss of utilities: No  Health Literacy: Not on file   Past Medical History:  Past Medical History:  Diagnosis Date   Eye globe prosthesis    GERD (gastroesophageal reflux disease)    History of blood transfusion 1973   related to abscess burst in my stomach   Hyperlipemia    Hyperlipidemia    Hypertension    Osteoarthritis of back    Lowerback    SVD (spontaneous vaginal delivery)    x 1, baby died at 2 wks of age   Type II diabetes mellitus (HCC)     Past Surgical History:  Procedure Laterality Date   APPENDECTOMY      COLONOSCOPY     DILATATION & CURETTAGE/HYSTEROSCOPY WITH MYOSURE N/A 02/25/2015   Procedure: DILATATION & CURETTAGE/HYSTEROSCOPY WITH MYOSURE;  Surgeon: Truman Corona, MD;  Location: [facility];  Service: Gynecology;  Laterality: N/A;   DILATION AND CURETTAGE OF UTERUS     ENUCLEATION Right 03/16/2017   ENUCLEATION Right 03/16/2017   Procedure: ENUCLEATION RIGHT EYE;  Surgeon: Loyd Kathryne Palm, MD;  Location: [facility] OR;  Service: Ophthalmology;  Laterality: Right;   EYE SURGERY     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6, 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   EYE SURGERY Right ~ 1974   S/P initial eye injury; scissors stuck in my eye   LAPAROSCOPIC CHOLECYSTECTOMY     SHOULDER ARTHROSCOPY WITH ROTATOR CUFF REPAIR Left    wire stitches per patient   SHOULDER ARTHROSCOPY  WITH ROTATOR CUFF REPAIR Right     Current Medications: Current Facility-Administered Medications  Medication Dose Route Frequency Provider Last Rate Last Admin   acetaminophen  (TYLENOL ) tablet 650 mg  650 mg Oral Q6H PRN Tex Drilling, NP   650 mg at 11/25/24 2125   alum & mag hydroxide-simeth (MAALOX/MYLANTA) 200-200-20 MG/5ML suspension 30 mL  30 mL Oral Q4H PRN Tex Drilling, NP       cloNIDine  (CATAPRES ) tablet 0.1 mg  0.1 mg Oral BID PRN Tex Drilling, NP       divalproex  (DEPAKOTE ) DR tablet 1,500 mg  1,500 mg Oral QHS Kamea Dacosta, MD   1,500 mg at 11/25/24 2124   divalproex  (DEPAKOTE ) DR tablet 500 mg  500 mg Oral q AM Najae Rathert, MD   500 mg at 11/26/24 9178   losartan  (COZAAR ) tablet 25 mg  25 mg Oral Daily Tex Drilling, NP   25 mg at 11/26/24 9178   magnesium  hydroxide (MILK OF MAGNESIA) suspension 30 mL  30 mL Oral Daily PRN Tex Drilling, NP       menthol  (CEPACOL) lozenge 3 mg  1 lozenge Oral PRN Bobbitt, Shalon E, NP   3 mg at 11/25/24 2125   methimazole  (TAPAZOLE ) tablet 10 mg  10 mg Oral Daily Nkwenti, Doris, NP   10 mg at 11/26/24 9178   OLANZapine  (ZYPREXA ) injection 5 mg  5 mg Intramuscular TID PRN Tex Drilling, NP        OLANZapine  (ZYPREXA ) injection 5 mg  5 mg Intramuscular TID PRN Tex Drilling, NP       OLANZapine  (ZYPREXA ) tablet 20 mg  20 mg Oral QHS Virlan Kempker, MD   20 mg at 11/25/24 2124   OLANZapine  zydis (ZYPREXA ) disintegrating tablet 5 mg  5 mg Oral TID PRN Tex Drilling, NP       OLANZapine  zydis (ZYPREXA ) disintegrating tablet 5 mg  5 mg Oral TID PRN Tex Drilling, NP       pantoprazole  (PROTONIX ) EC tablet 40 mg  40 mg Oral Daily Nkwenti, Doris, NP   40 mg at 11/26/24 9178   traZODone  (DESYREL ) tablet 50 mg  50 mg Oral QHS PRN Tex Drilling, NP   50 mg at 11/25/24 2124    Lab Results:  No results found for this or any previous visit (from the past 48 hours).       Blood Alcohol level:  Lab Results  Component Value Date   [facility] <15 10/14/2024   ETH <10 07/02/2022    Metabolic Disorder Labs: Lab Results  Component Value Date   HGBA1C 5.5 10/14/2024   MPG 111.15 10/14/2024   MPG 137 01/06/2022   Lab Results  Component Value Date   PROLACTIN 5.8 10/14/2024   Lab Results  Component Value Date   CHOL 167 10/14/2024   TRIG 68 10/14/2024   HDL 60 10/14/2024   CHOLHDL 2.8 10/14/2024   VLDL 14 10/14/2024   LDLCALC 93 10/14/2024   LDLCALC 73 03/13/2024    Physical Findings: AIMS:  , ,  ,  ,    CIWA:    COWS:      Psychiatric Specialty Exam:  Presentation  General Appearance:  Appropriate for Environment  Eye Contact: Fleeting  Speech: Normal Rate  Speech Volume: Decreased    Mood and Affect  Mood:fine  Affect: Flat   Thought Process  Thought Processes: impoverished  Orientation:Partial  Thought Content: Chronic delusions Hallucinations: Denies  Ideas of Reference:Delusions; Paranoia  Suicidal Thoughts: Denies  Homicidal  Thoughts: Denies   Sensorium  Memory: Immediate Fair; Recent Fair  Judgment: Impaired  Insight: Shallow   Executive Functions  Concentration: Fair  Attention Span: Fair  Recall: Poor  Fund of  Knowledge: Fair  Language: Fair   Psychomotor Activity  Psychomotor Activity: No data recorded  Musculoskeletal: Strength & Muscle Tone: within normal limits Gait & Station: normal Assets  Assets: Manufacturing Systems Engineer; Desire for Improvement; Social Support    Physical Exam: Physical Exam Vitals and nursing note reviewed.    ROS Blood pressure 121/68, pulse 77, temperature 98.2 F (36.8 C), resp. rate 16, height 5' 8 (1.727 m), weight 48.3 kg, SpO2 99%. Body mass index is 16.19 kg/m.  Diagnosis: Principal Problem:   Schizophrenia, paranoid (HCC) Major neurocognitive disorder-slums total score of 13/30   Treatment Plan Summary: APS referral has been made as patient lacks capacity to make medical decisions.  Recommending legal guardianship and further placement at appropriate level of care  Safety and Monitoring:             -- Involuntary admission to inpatient psychiatric unit for safety, stabilization and treatment             -- Daily contact with patient to assess and evaluate symptoms and progress in treatment             -- Patient's case to be discussed in multi-disciplinary team meeting             -- Observation Level: q15 minute checks             -- Vital signs:  q12 hours             -- Precautions: suicide, elopement, and assault   2. Psychiatric Diagnoses and Treatment:  Check Depakote  levels 11/21/2024           Depakote  changed to 500 mg every morning and 1500 mg nightly-in next 5 days 11/13/2024 Depakote  level-44 Zyprexa  20 mg nightly BMP-within normal limits Occupational Therapy evaluation for assessing appropriate level of care -- The risks/benefits/side-effects/alternatives to this medication were discussed in detail with the patient and time was given for questions. The patient consents to medication trial.                -- Metabolic profile and EKG monitoring obtained while on an atypical antipsychotic (BMI: Lipid Panel: HbgA1c: QTc:)               -- Encouraged patient to participate in unit milieu and in scheduled group therapies   Occupational Therapy recommendations If plan is discharge home, recommend the following:   Direct supervision/assist for medications management;Direct supervision/assist for financial management;Assist for transportation;Assistance with cooking/housework    Ms Warr was seen for OT treatment on this date. Upon arrival to room pt in dayroom, agreeable to tx. The SLUMS is a 30-point screening questionnaire that tests orientation, memory, attention, problem solving, and executive function. Pt scored a 13/30 indicating Dementia. Of note, it is not within occupational therapy scope of practice to diagnose cognitive impairments, this screen indicates need for further testing. Pt with noted impairments in memory and problem solving limiting ability to participate functionally in medication management, bill management, and safety cooking. Will continue to follow POC. Continue to recommend SUPERVISION for safety with iADLs.    [facility] Mental Status Examination Orientation: 2/3  Calculations: 0/3 Naming animals: 2/3 Patient named 14 animals (0 points is 0-4 animals; 1 is 5-9 animals; 2 is 10-14 animals; 3 is  15+ animals) Recall: 3/5  Attention: 1/2 Clock drawing: 1/4 Visual Processing: 2/2 Paragraph Memory: 2/8  Total: 13/30;  Given that patient has completed high school, this score falls in the dementia range.   4. Discharge Planning:   -- Social work and case management to assist with discharge planning and identification of hospital follow-up needs prior to discharge  -- Estimated LOS: 3-4 days  Allyn Foil, MD 11/26/2024, 4:29 PM

## 2024-11-26 NOTE — Group Note (Signed)
 Date:  11/26/2024 Time:  4:27 PM  Group Topic/Focus:    Coloring offers geriatric patients significant benefits, including reducing stress and anxiety, improving fine motor skills (dexterity, hand-eye coordination), boosting cognitive function (focus, memory), providing creative self-expression, fighting boredom, and encouraging social interaction, all while fostering a sense of accomplishment and combating loneliness, especially for those with dementia or limited mobility.  Coloring induces a meditative state, channeling focus and interrupting negative thought patterns, which lowers anxiety and calms the mind.It can boost serotonin levels, leading to better moods, and help seniors forget aches and pains temporarily.  Participation Level:  Did Not Attend   Harlene LITTIE Gavel 11/26/2024, 4:27 PM

## 2024-11-26 NOTE — Plan of Care (Signed)
   Problem: Education: Goal: Emotional status will improve Outcome: Progressing

## 2024-11-26 NOTE — Group Note (Signed)
 Recreation Therapy Group Note   Group Topic:Leisure Education  Group Date: 11/26/2024 Start Time: 1400 End Time: 1450 Facilitators: Celestia Jeoffrey BRAVO, LRT, CTRS Location: Courtyard  Group Description: Music. Patients are encouraged to name their favorite song(s) for LRT to play song through speaker for group to hear, while in the courtyard getting fresh air and sunlight. Patients educated on the definition of leisure and the importance of having different leisure interests outside of the hospital. Group discussed how leisure activities can often be used as pharmacologist and that listening to music and being outside are examples.    Goal Area(s) Addressed:  Patient will identify a current leisure interest.  Patient will practice making a positive decision. Patient will have the opportunity to try a new leisure activity.   Affect/Mood: Appropriate and Flat   Participation Level: Moderate   Participation Quality: Independent   Behavior: Calm and Cooperative   Speech/Thought Process: Coherent   Insight: Fair   Judgement: Fair    Modes of Intervention: Guided Discussion and Music   Patient Response to Interventions:  Receptive   Education Outcome:  Acknowledges education   Clinical Observations/Individualized Feedback: Quandra was active in their participation of session activities and group discussion. Pt identified I like all types of music. Pt interacted well with LRT and peers duration of session.    Plan: Continue to engage patient in RT group sessions 2-3x/week.   Jeoffrey BRAVO Celestia, LRT, CTRS 11/26/2024 4:29 PM

## 2024-11-26 NOTE — Group Note (Signed)
 Date:  11/26/2024 Time:  2:48 PM  Group Topic/Focus:  Early Warning Signs:   The focus of this group is to help patients identify signs or symptoms they exhibit before slipping into an unhealthy state or crisis.    Participation Level:  Active  Participation Quality:  Appropriate  Affect:  Appropriate  Cognitive:  Appropriate  Insight: Appropriate  Engagement in Group:  Engaged  Modes of Intervention:  Discussion  Shari [name] 11/26/2024, 2:48 PM

## 2024-11-26 NOTE — Progress Notes (Signed)
 Tour of Duty:  Eleanor KATHEE Flemings, RN, 11/26/2024, Tour of Duty: 0700-1900  SI/HI/AVH: Denies  Self-Reported   Mood: Positive  Anxiety: Denies Depression: Denies Irritability: Denies  Broset  Violence Prevention Guidelines *See Row Information*: Small Violence Risk interventions implemented   LBM  Last BM Date : 11/25/24   Pain: not present  Patient Refusals (including Rx): No  >>Shift Summary: Patient pleasant and cooperative. Compliant with medications. Attended group therapy. Patient denies SI, HI, and AVH. Safety maintained on the unit.  Last Vitals  Vitals Weight: 48.3 kg Temp: 98.2 F (36.8 C) Temp Source: Oral Pulse Rate: 77 Resp: 16 BP: 121/68 Patient Position: (not recorded)  Admission Type  Psych Admission Type (Psych Patients Only) Admission Status: Involuntary Date 72 hour document signed : (not recorded) Time 72 hour document signed : (not recorded) Provider Notified (First and Last Name) (see details for LINK to note): (not recorded)   Psychosocial Assessment  Psychosocial Assessment Patient Complaints: None Eye Contact: Fair Facial Expression: Flat Affect: Appropriate to circumstance Speech: Logical/coherent Interaction: Minimal Motor Activity: Slow Appearance/Hygiene: Unremarkable Behavior Characteristics: Cooperative Mood: Pleasant   Aggressive Behavior  Targets: (not recorded)   Thought Process  Thought Process Coherency: Within Defined Limits Content: Within Defined Limits Delusions: None reported or observed Perception: Within Defined Limits Hallucination: None reported or observed Judgment: Impaired Confusion: Mild  Danger to Self/Others  Danger to Self Current suicidal ideation?: Denies Description of Suicide Plan: (not recorded) Self-Injurious Behavior: (not recorded) Agreement Not to Harm Self: (not recorded) Description of Agreement: (not recorded) Danger to Others: None reported or observed

## 2024-11-26 NOTE — Group Note (Signed)
 Date:  11/26/2024 Time:  8:36 PM  Group Topic/Focus:  Wrap-Up Group:   The focus of this group is to help patients review their daily goal of treatment and discuss progress on daily workbooks.    Participation Level:  Active  Participation Quality:  Appropriate  Affect:  Appropriate  Cognitive:  Appropriate  Insight: Appropriate  Engagement in Group:  Engaged  Modes of Intervention:  Discussion  Additional Comments:    Shari [name] 11/26/2024, 8:36 PM

## 2024-11-26 NOTE — Group Note (Signed)
 LCSW Group Therapy Note   Group Date: 11/26/2024 Start Time: 1300 End Time: 1330   Type of Therapy and Topic:  Group Therapy: Challenging Core Beliefs  Participation Level:  Did Not Attend  Description of Group:  Patients were educated about core beliefs and asked to identify one harmful core belief that they have. Patients were asked to explore from where those beliefs originate. Patients were asked to discuss how those beliefs make them feel and the resulting behaviors of those beliefs. They were then be asked if those beliefs are true and, if so, what evidence they have to support them. Lastly, group members were challenged to replace those negative core beliefs with helpful beliefs.   Therapeutic Goals:   1. Patient will identify harmful core beliefs and explore the origins of such beliefs. 2. Patient will identify feelings and behaviors that result from those core beliefs. 3. Patient will discuss whether such beliefs are true. 4.  Patient will replace harmful core beliefs with helpful ones.  Summary of Patient Progress:  X  Therapeutic Modalities: Cognitive Behavioral Therapy; Solution-Focused Therapy   Sokhna Christoph D Syriana Croslin, [place] 11/26/2024  1:56 PM

## 2024-11-27 DIAGNOSIS — F200 Paranoid schizophrenia: Secondary | ICD-10-CM | POA: Diagnosis not present

## 2024-11-27 DIAGNOSIS — F0390 Unspecified dementia without behavioral disturbance: Secondary | ICD-10-CM | POA: Diagnosis not present

## 2024-11-27 NOTE — Group Note (Signed)
 Recreation Therapy Group Note   Group Topic:Communication  Group Date: 11/27/2024 Start Time: 1250 End Time: 1325 Facilitators: Celestia Jeoffrey BRAVO, LRT, CTRS Location: Dayroom  Group Description: LRT and NT, Leita, passed out large Christmas bags filled with 4-6 individually wrapped presents inside for the patient to unwrap. Christmas music was being played in the background. LRT, NT, and patients discussed past Christmas traditions and fun memories they have made while communicating with one another in the dayroom.   Goal Area(s) addressed: Patient will increase communication.  Patient will reminisce on past memory Patient will practice gratitude.    Affect/Mood: Appropriate   Participation Level: Active and Engaged   Participation Quality: Independent   Behavior: Calm and Cooperative   Speech/Thought Process: Coherent   Insight: Fair   Judgement: Fair    Modes of Intervention: Activity, Music, and Open Conversation   Patient Response to Interventions:  Attentive, Engaged, Interested , and Receptive   Education Outcome:  Acknowledges education   Clinical Observations/Individualized Feedback: Yevette was active in their participation of session activities and group discussion. Pt interacted well with LRT and peers duration of session.    Plan: Continue to engage patient in RT group sessions 2-3x/week.   Jeoffrey BRAVO Celestia, LRT, CTRS 11/27/2024 1:40 PM

## 2024-11-27 NOTE — Plan of Care (Signed)
   Problem: Education: Goal: Knowledge of Shari [name] General Education information/materials will improve Outcome: Progressing Goal: Emotional status will improve Outcome: Progressing Goal: Mental status will improve Outcome: Progressing Goal: Verbalization of understanding the information provided will improve Outcome: Progressing   Problem: Activity: Goal: Interest or engagement in activities will improve Outcome: Progressing Goal: Sleeping patterns will improve Outcome: Progressing   Problem: Coping: Goal: Ability to verbalize frustrations and anger appropriately will improve Outcome: Progressing Goal: Ability to demonstrate self-control will improve Outcome: Progressing

## 2024-11-27 NOTE — Progress Notes (Signed)
 Occupational Therapy Treatment Patient Details Name: Shari [name] MRN: [id number] DOB: [birth year] Today's Date: 11/27/2024   History of present illness 68 year old female with a history of Schizoaffective Disorder, Bipolar Type who presents via [employer] under IVC. Per IVC, pt refused medications, wasn't sleeping, responding to internal stimuli, verbally aggressive.   OT comments  Pt seen for OT treatment, pleasant and agreeable to session. Pt participates in IADL tasks including grocery shopping list, bill paying, budgeting and problem-solving transportation for errands / appointments. Pt demonstrates deficits in selective attention, requires cues for reorientation to task, and has difficulties problem-solving. Pt creates a partial grocery list, naming items that exceed her budget of $80 for shopping trip and unable to recognize errors. Continue to recommend supervision for IADL performance including management of medications, bills, household chores and cooking. OT will continue to follow.       If plan is discharge home, recommend the following:  Direct supervision/assist for medications management;Direct supervision/assist for financial management;Assist for transportation;Assistance with cooking/housework   Equipment Recommendations  None recommended by OT    Recommendations for Other Services      Precautions / Restrictions Precautions Precautions: None Recall of Precautions/Restrictions: Intact Restrictions Weight Bearing Restrictions Per Provider Order: No       Mobility Bed Mobility Overal bed mobility: Independent                  Transfers Overall transfer level: Independent Equipment used: None                     Balance Overall balance assessment: No apparent balance deficits (not formally assessed)                                         ADL either performed or assessed with clinical judgement   ADL Overall ADL's : Independent                                             Selective  Communication Communication Communication: No apparent difficulties   Cognition Arousal: Alert Behavior During Therapy: WFL for tasks assessed/performed Cognition: No family/caregiver present to determine baseline, Cognition impaired                               Following commands: Intact        Cueing   Cueing Techniques: Verbal cues  Exercises Exercises: Other exercises Other Exercises Other Exercises: Session focused on IADL tasks and problem-solving budgeting, grocery shopping list, bill paying. Pt demonstrates deficits in selective attention, often trailing off mid-sentence and requiring cues for task orientation.    Shoulder Instructions       General Comments      Pertinent Vitals/ Pain       Pain Assessment Pain Assessment: No/denies pain  Home Living                                          Prior Functioning/Environment              Frequency  Min 1X/week        Progress Toward  Goals  OT Goals(current goals can now be found in the care plan section)  Progress towards OT goals: Progressing toward goals  Acute Rehab OT Goals OT Goal Formulation: With patient Time For Goal Achievement: 11/29/24  Plan      Co-evaluation                 AM-PAC OT 6 Clicks Daily Activity     Outcome Measure   Help from another person eating meals?: None Help from another person taking care of personal grooming?: None Help from another person toileting, which includes using toliet, bedpan, or urinal?: None Help from another person bathing (including washing, rinsing, drying)?: None Help from another person to put on and taking off regular upper body clothing?: None Help from another person to put on and taking off regular lower body clothing?: None 6 Click Score: 24    End of Session    OT Visit Diagnosis: Other symptoms and signs involving  cognitive function   Activity Tolerance Patient tolerated treatment well   Patient Left Other (comment) (in hallway heading to dayroom)   Nurse Communication Mobility status        Time: 8780-8764 OT Time Calculation (min): 16 min  Charges: OT General Charges $OT Visit: 1 Visit OT Treatments $Self Care/Home Management : 8-22 mins  Kiersten Coss L. Sonji Starkes, OTR/L  11/27/2024, 5:11 PM

## 2024-11-27 NOTE — Group Note (Signed)
 Date:  11/27/2024 Time:  3:25 PM  Group Topic/Focus:  Goals Group:   The focus of this group is to help patients establish daily goals to achieve during treatment and discuss how the patient can incorporate goal setting into their daily lives to aide in recovery. Managing Feelings:   The focus of this group is to identify what feelings patients have difficulty handling and develop a plan to handle them in a healthier way upon discharge.    Participation Level:  Active  Participation Quality:  Appropriate and Attentive  Affect:  Appropriate  Cognitive:  Alert  Insight: Appropriate  Engagement in Group:  Engaged  Modes of Intervention:  Activity  Additional Comments:   COURTYARD AND HOT COCO   Maglione,Donnabelle Blanchard E 11/27/2024, 3:25 PM

## 2024-11-27 NOTE — Progress Notes (Signed)
 Behavior:   Mild confusion.  Pleasant and cooperative.    Psych assessment: Flat affect. Denies SI/HI and AVH.    Interaction / Group attendance:  Present in the milieu.  Minimal interaction with peers and staff.  Attends groups.  Medication/ PRNs: Compliant.  PRN medications given for pain and sore throat.   Pain: 5/10 in shoulder  15 min checks in place for safety.

## 2024-11-27 NOTE — Progress Notes (Signed)
 [facility] MD Progress Note  11/27/2024 11:53 AM Shari [name]  MRN:  [id number] Patient is a 68 year old female with a history of Schizoaffective Disorder, Bipolar Type who presents via GPD under IVC, initiated by CM, Falencio with re-entry program, to [facility] for assessment. Per IVC, [name] has been diagnosed with Schizophrenia and is refusing to take medication. [name] isn't sleeping. Residents report her screaming and yelling all night long.  [name] responding to internal stimuli.  [name] verbally aggressive.  The respondent has locked all of the residents inside of the transitional housing home.  They could not leave the home which prompted the IVC.  Residents did not have access to food, the restroom or anything as a result of being locked in.  [name] is currently under Adult protective services care. Patient is admitted to [facility] unit with Q15 min safety monitoring. Multidisciplinary team approach is offered. Medication management; group/milieu therapy is offered.   Subjective:  Chart reviewed, case discussed in multidisciplinary meeting, patient seen during rounds.   Patient is noted to be sitting in the day area.  She expresses her frustration that she does not have any clothes to change and she has been in the hospital for so many weeks.  She did acknowledge that due to holidays the APS is working at a slow pace.  She remains adamant that the transitional housing is her permanent address and that she needs to go back there.  [name] not responding to internal stimuli even though her paranoia remains chronic.  She denies SI/HI/plan.  No updates from APS so far due to the holiday week  [name], DSS caseworker, [phone]  was granted ex-parte until guardianship is granted       Able to understand medical problem-patient remains paranoid and delusional and has no understanding of the events that led up to her admission including her housing situation which  she continues to claim as her own house  Able to understand proposed treatment-patient has no understanding of the treatment options and medications that are proposed to her continues to claim that she does not require any psychotropics   Able to understand alternative to proposed treatment (if any)-no   Able to understand option of refusing proposed treatment (including withholding or withdrawing proposed treatment)-no   Able to appreciate reasonably foreseeable consequences of accepting proposed treatment-no  After thorough assessment, it is our clinical opinion-  Capacity is not competency. Competency is determined by legal system and judge. Capacity can vary from time to time depending on the mental status of the patient.  Past Psychiatric History: see h&P Family History:  Family History  Problem Relation Age of Onset   Diabetes Mother    CAD Mother    Lung cancer Father    Social History:  Social History   Substance and Sexual Activity  Alcohol Use Yes   Comment: beer occasionally     Social History   Substance and Sexual Activity  Drug Use No    Social History   Socioeconomic History   Marital status: Widowed    Spouse name: Not on file   Number of children: Not on file   Years of education: Not on file   Highest education level: Not on file  Occupational History   Not on file  Tobacco Use   Smoking status: Every Day    Current packs/day: 0.50    Average packs/day: 0.5 packs/day for 40.0 years (20.0 ttl pk-yrs)  Types: Cigarettes   Smokeless tobacco: Never  Vaping Use   Vaping status: Never Used  Substance and Sexual Activity   Alcohol use: Yes    Comment: beer occasionally   Drug use: No   Sexual activity: Not Currently    Birth control/protection: Post-menopausal  Other Topics Concern   Not on file  Social History Narrative   Not on file   Social Drivers of Health   Tobacco Use: High Risk (11/02/2024)   Patient History    Smoking Tobacco  Use: Every Day    Smokeless Tobacco Use: Never    Passive Exposure: Not on file  Financial Resource Strain: Not on file  Food Insecurity: No Food Insecurity (10/17/2024)   Epic    Worried About Programme Researcher, Broadcasting/film/video in the Last Year: Never true    Ran Out of Food in the Last Year: Never true  Transportation Needs: No Transportation Needs (10/17/2024)   Epic    Lack of Transportation (Medical): No    Lack of Transportation (Non-Medical): No  Recent Concern: Transportation Needs - Unmet Transportation Needs (10/14/2024)   Epic    Lack of Transportation (Medical): Yes    Lack of Transportation (Non-Medical): Yes  Physical Activity: Not on file  Stress: Not on file  Social Connections: Unknown (10/17/2024)   Social Connection and Isolation Panel    Frequency of Communication with Friends and Family: Patient unable to answer    Frequency of Social Gatherings with Friends and Family: Patient unable to answer    Attends Religious Services: Patient unable to answer    Active Member of Clubs or Organizations: Patient declined    Attends Banker Meetings: Patient unable to answer    Marital Status: Widowed  Depression (PHQ2-9): Not on file  Alcohol Screen: Low Risk (10/17/2024)   Alcohol Screen    Last Alcohol Screening Score (AUDIT): 0  Housing: Low Risk (10/17/2024)   Epic    Unable to Pay for Housing in the Last Year: No    Number of Times Moved in the Last Year: 0    Homeless in the Last Year: No  Utilities: Not At Risk (10/17/2024)   Epic    Threatened with loss of utilities: No  Health Literacy: Not on file   Past Medical History:  Past Medical History:  Diagnosis Date   Eye globe prosthesis    GERD (gastroesophageal reflux disease)    History of blood transfusion 1973   related to abscess burst in my stomach   Hyperlipemia    Hyperlipidemia    Hypertension    Osteoarthritis of back    Lowerback    SVD (spontaneous vaginal delivery)    x 1, baby died at 2  wks of age   Type II diabetes mellitus (HCC)     Past Surgical History:  Procedure Laterality Date   APPENDECTOMY     COLONOSCOPY     DILATATION & CURETTAGE/HYSTEROSCOPY WITH MYOSURE N/A 02/25/2015   Procedure: DILATATION & CURETTAGE/HYSTEROSCOPY WITH MYOSURE;  Surgeon: Truman Corona, MD;  Location: [facility];  Service: Gynecology;  Laterality: N/A;   DILATION AND CURETTAGE OF UTERUS     ENUCLEATION Right 03/16/2017   ENUCLEATION Right 03/16/2017   Procedure: ENUCLEATION RIGHT EYE;  Surgeon: Loyd Kathryne Palm, MD;  Location: [facility] OR;  Service: Ophthalmology;  Laterality: Right;   EYE SURGERY     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6, 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   EYE SURGERY Right ~ 1974  S/P initial eye injury; scissors stuck in my eye   LAPAROSCOPIC CHOLECYSTECTOMY     SHOULDER ARTHROSCOPY WITH ROTATOR CUFF REPAIR Left    wire stitches per patient   SHOULDER ARTHROSCOPY WITH ROTATOR CUFF REPAIR Right     Current Medications: Current Facility-Administered Medications  Medication Dose Route Frequency Provider Last Rate Last Admin   acetaminophen  (TYLENOL ) tablet 650 mg  650 mg Oral Q6H PRN Tex Drilling, NP   650 mg at 11/27/24 0943   alum & mag hydroxide-simeth (MAALOX/MYLANTA) 200-200-20 MG/5ML suspension 30 mL  30 mL Oral Q4H PRN Tex Drilling, NP       cloNIDine  (CATAPRES ) tablet 0.1 mg  0.1 mg Oral BID PRN Tex Drilling, NP       divalproex  (DEPAKOTE ) DR tablet 1,500 mg  1,500 mg Oral QHS Jakara Blatter, MD   1,500 mg at 11/26/24 2018   divalproex  (DEPAKOTE ) DR tablet 500 mg  500 mg Oral q AM Ezme Duch, MD   500 mg at 11/27/24 9075   losartan  (COZAAR ) tablet 25 mg  25 mg Oral Daily Nkwenti, Doris, NP   25 mg at 11/27/24 9075   magnesium  hydroxide (MILK OF MAGNESIA) suspension 30 mL  30 mL Oral Daily PRN Tex Drilling, NP       menthol  (CEPACOL) lozenge 3 mg  1 lozenge Oral PRN Bobbitt, Shalon E, NP   3 mg at 11/27/24 0944   methimazole  (TAPAZOLE ) tablet 10 mg  10 mg Oral Daily Nkwenti, Doris,  NP   10 mg at 11/27/24 9075   OLANZapine  (ZYPREXA ) injection 5 mg  5 mg Intramuscular TID PRN Tex Drilling, NP       OLANZapine  (ZYPREXA ) injection 5 mg  5 mg Intramuscular TID PRN Tex Drilling, NP       OLANZapine  (ZYPREXA ) tablet 20 mg  20 mg Oral QHS Jaylan Hinojosa, MD   20 mg at 11/26/24 2018   OLANZapine  zydis (ZYPREXA ) disintegrating tablet 5 mg  5 mg Oral TID PRN Tex Drilling, NP       OLANZapine  zydis (ZYPREXA ) disintegrating tablet 5 mg  5 mg Oral TID PRN Tex Drilling, NP       pantoprazole  (PROTONIX ) EC tablet 40 mg  40 mg Oral Daily Nkwenti, Doris, NP   40 mg at 11/27/24 9075   traZODone  (DESYREL ) tablet 50 mg  50 mg Oral QHS PRN Tex Drilling, NP   50 mg at 11/26/24 2019    Lab Results:  No results found for this or any previous visit (from the past 48 hours).       Blood Alcohol level:  Lab Results  Component Value Date   [facility] <15 10/14/2024   ETH <10 07/02/2022    Metabolic Disorder Labs: Lab Results  Component Value Date   HGBA1C 5.5 10/14/2024   MPG 111.15 10/14/2024   MPG 137 01/06/2022   Lab Results  Component Value Date   PROLACTIN 5.8 10/14/2024   Lab Results  Component Value Date   CHOL 167 10/14/2024   TRIG 68 10/14/2024   HDL 60 10/14/2024   CHOLHDL 2.8 10/14/2024   VLDL 14 10/14/2024   LDLCALC 93 10/14/2024   LDLCALC 73 03/13/2024    Physical Findings: AIMS:  , ,  ,  ,    CIWA:    COWS:      Psychiatric Specialty Exam:  Presentation  General Appearance:  Appropriate for Environment  Eye Contact: Fleeting  Speech: Normal Rate  Speech Volume: Decreased    Mood  and Affect  Mood:fine  Affect: Flat   Thought Process  Thought Processes: impoverished  Orientation:Partial  Thought Content: Chronic delusions Hallucinations: Denies  Ideas of Reference:Delusions; Paranoia  Suicidal Thoughts: Denies  Homicidal Thoughts: Denies   Sensorium  Memory: Immediate Fair; Recent  Fair  Judgment: Impaired  Insight: Shallow   Executive Functions  Concentration: Fair  Attention Span: Fair  Recall: Poor  Fund of Knowledge: Fair  Language: Fair   Psychomotor Activity  Psychomotor Activity: No data recorded  Musculoskeletal: Strength & Muscle Tone: within normal limits Gait & Station: normal Assets  Assets: Manufacturing Systems Engineer; Desire for Improvement; Social Support    Physical Exam: Physical Exam Vitals and nursing note reviewed.    ROS Blood pressure 133/75, pulse 89, temperature 98.1 F (36.7 C), resp. rate 16, height 5' 8 (1.727 m), weight 48.3 kg, SpO2 98%. Body mass index is 16.19 kg/m.  Diagnosis: Principal Problem:   Schizophrenia, paranoid (HCC) Major neurocognitive disorder-slums total score of 13/30   Treatment Plan Summary: APS referral has been made as patient lacks capacity to make medical decisions.  Recommending legal guardianship and further placement at appropriate level of care  Safety and Monitoring:             -- Involuntary admission to inpatient psychiatric unit for safety, stabilization and treatment             -- Daily contact with patient to assess and evaluate symptoms and progress in treatment             -- Patient's case to be discussed in multi-disciplinary team meeting             -- Observation Level: q15 minute checks             -- Vital signs:  q12 hours             -- Precautions: suicide, elopement, and assault   2. Psychiatric Diagnoses and Treatment:  Check Depakote  levels 11/20/2024-60           Depakote  changed to 500 mg every morning and 1500 mg nightly-in next 5 days 11/13/2024 Depakote  level-44 Zyprexa  20 mg nightly BMP-within normal limits Occupational Therapy evaluation for assessing appropriate level of care -- The risks/benefits/side-effects/alternatives to this medication were discussed in detail with the patient and time was given for questions. The patient consents to  medication trial.                -- Metabolic profile and EKG monitoring obtained while on an atypical antipsychotic (BMI: Lipid Panel: HbgA1c: QTc:)              -- Encouraged patient to participate in unit milieu and in scheduled group therapies   Occupational Therapy recommendations If plan is discharge home, recommend the following:   Direct supervision/assist for medications management;Direct supervision/assist for financial management;Assist for transportation;Assistance with cooking/housework    Ms Welle was seen for OT treatment on this date. Upon arrival to room pt in dayroom, agreeable to tx. The SLUMS is a 30-point screening questionnaire that tests orientation, memory, attention, problem solving, and executive function. Pt scored a 13/30 indicating Dementia. Of note, it is not within occupational therapy scope of practice to diagnose cognitive impairments, this screen indicates need for further testing. Pt with noted impairments in memory and problem solving limiting ability to participate functionally in medication management, bill management, and safety cooking. Will continue to follow POC. Continue to recommend SUPERVISION for safety with iADLs.  [facility] Mental Status Examination Orientation: 2/3  Calculations: 0/3 Naming animals: 2/3 Patient named 14 animals (0 points is 0-4 animals; 1 is 5-9 animals; 2 is 10-14 animals; 3 is 15+ animals) Recall: 3/5  Attention: 1/2 Clock drawing: 1/4 Visual Processing: 2/2 Paragraph Memory: 2/8  Total: 13/30;  Given that patient has completed high school, this score falls in the dementia range.   4. Discharge Planning:   -- Social work and case management to assist with discharge planning and identification of hospital follow-up needs prior to discharge  -- Estimated LOS: 3-4 days  Allyn Foil, MD 11/27/2024, 11:53 AM

## 2024-11-27 NOTE — BHH Counselor (Signed)
 Placement Update:   Pt currently under ex-parte [facility] APS- [name])   Pt is currently awaiting placement. New FL2 was sent to [name] by CSW [facility].   Pt does NOT qualify for group home placement because pt does NOT qualify for Medicaid due to income, therefore pt's FL2 asks for assisted living.   At this time Arch reports there is no assisted living facility who has accepted pt at this time.   CSW continues to wait for updates for [facility] APS regarding placement efforts.   Lum Croft, MSW, [place] 11/27/2024 12:59 PM

## 2024-11-27 NOTE — Group Note (Signed)
 Date:  11/27/2024 Time:  1:59 PM  Group Topic/Focus:  Self Care:   The focus of this group is to help patients understand the importance of self-care in order to improve or restore emotional, physical, spiritual, interpersonal, and financial health.    Participation Level:  Active  Participation Quality:  Appropriate  Affect:  Appropriate  Cognitive:  Alert and Appropriate  Insight: Appropriate  Engagement in Group:  Engaged  Modes of Intervention:  Activity, Discussion, Orientation, and Socialization  Additional Comments:  Christmas party    Maglione,Kazia Grisanti E 11/27/2024, 1:59 PM

## 2024-11-27 NOTE — Group Note (Signed)
 Date:  11/27/2024 Time:  9:26 PM  Group Topic/Focus:  Wrap-Up Group:   The focus of this group is to help patients review their daily goal of treatment and discuss progress on daily workbooks.    Participation Level:  Active  Participation Quality:  Appropriate  Affect:  Appropriate  Cognitive:  Alert  Insight: Appropriate  Engagement in Group:  Engaged  Modes of Intervention:  Discussion  Additional Comments:    [name] [name] 11/27/2024, 9:26 PM

## 2024-11-27 NOTE — Plan of Care (Signed)
  Problem: Education: Goal: Emotional status will improve Outcome: Progressing   Problem: Activity: Goal: Interest or engagement in activities will improve Outcome: Progressing   Problem: Coping: Goal: Ability to demonstrate self-control will improve Outcome: Progressing

## 2024-11-28 DIAGNOSIS — F200 Paranoid schizophrenia: Secondary | ICD-10-CM | POA: Diagnosis not present

## 2024-11-28 DIAGNOSIS — F0390 Unspecified dementia without behavioral disturbance: Secondary | ICD-10-CM | POA: Diagnosis not present

## 2024-11-28 NOTE — Group Note (Signed)
 Date:  11/28/2024 Time:  3:41 PM  Group Topic/Focus:  Self Care:   The focus of this group is to help patients understand the importance of self-care in order to improve or restore emotional, physical, spiritual, interpersonal, and financial health.  Meditation offers significant benefits for geriatric patients, including boosting cognitive health (memory, focus), improving emotional well-being (reducing stress, anxiety, depression, loneliness), managing chronic pain, lowering blood pressure, strengthening the immune system, and enhancing physical balance and flexibility through gentle movements like Tai Chi, all while supporting brain plasticity and overall quality of life as they age.   Participation Level:  Active  Participation Quality:  Appropriate  Affect:  Appropriate  Cognitive:  Appropriate  Insight: Appropriate  Engagement in Group:  Engaged  Modes of Intervention:  Activity  Additional Comments:  N/A  Shari [name] Gavel 11/28/2024, 3:41 PM

## 2024-11-28 NOTE — Group Note (Signed)
 Date:  11/28/2024 Time:  10:35 AM  Group Topic/Focus:  Christmas Movie     Participation Level:  Active  Participation Quality:  Appropriate  Affect:  Appropriate  Cognitive:  Appropriate  Insight: Appropriate  Engagement in Group:  Engaged  Modes of Intervention:  Activity and Socialization  Additional Comments:  none  [name] 11/28/2024, 10:35 AM

## 2024-11-28 NOTE — Plan of Care (Signed)

## 2024-11-28 NOTE — Group Note (Signed)
 Date:  11/28/2024 Time:  8:31 PM  Group Topic/Focus:  Goals Group:   The focus of this group is to help patients establish daily goals to achieve during treatment and discuss how the patient can incorporate goal setting into their daily lives to aide in recovery. Personal Choices and Values:   The focus of this group is to help patients assess and explore the importance of values in their lives, how their values affect their decisions, how they express their values and what opposes their expression. Spirituality:   The focus of this group is to discuss how one's spirituality can aide in recovery.    Participation Level:  Active  Participation Quality:  Appropriate  Affect:  Appropriate  Cognitive:  Appropriate  Insight: Appropriate  Engagement in Group:  Improving  Modes of Intervention:  Discussion  Additional Comments:  Pt attended group and participated in the discussion  Ellyn Rubiano E Marcella Charlson 11/28/2024, 8:31 PM

## 2024-11-28 NOTE — Progress Notes (Signed)
 [facility] MD Progress Note  11/28/2024 12:24 PM BRETT SOZA  MRN:  [id number] Patient is a 68 year old female with a history of Schizoaffective Disorder, Bipolar Type who presents via [employer] under IVC, initiated by CM, Falencio with re-entry program, to [facility] for assessment. Per IVC, [name] has been diagnosed with Schizophrenia and is refusing to take medication. [name] isn't sleeping. Residents report her screaming and yelling all night long.  [name] responding to internal stimuli.  [name] verbally aggressive.  The respondent has locked all of the residents inside of the transitional housing home.  They could not leave the home which prompted the IVC.  Residents did not have access to food, the restroom or anything as a result of being locked in.  [name] is currently under Adult protective services care. Patient is admitted to [facility] unit with Q15 min safety monitoring. Multidisciplinary team approach is offered. Medication management; group/milieu therapy is offered.   Subjective:  Chart reviewed, case discussed in multidisciplinary meeting, patient seen during rounds.   Patient is noted to be sitting in the day area.  She informs the provider that she is waiting for her friend to bring her some clothes.  [name] unable to give any details of this friend as she had no visitors so far.  She reports fair appetite and sleep.  She denies auditory/visual hallucinations and denies SI/HI/plan.  Patient remains frustrated about her situation being in the hospital and continues to claim the transitional house being her own house and that she needs to be transferred to that house.  Provider tried to educate her that there are other residents in the house and patient reports that they need to leave the house.  Patient expressed her awareness about holidays delaying the transition of care  [name], DSS caseworker, [phone]  was granted ex-parte until guardianship is granted        Able to understand medical problem-patient remains paranoid and delusional and has no understanding of the events that led up to her admission including her housing situation which she continues to claim as her own house  Able to understand proposed treatment-patient has no understanding of the treatment options and medications that are proposed to her continues to claim that she does not require any psychotropics   Able to understand alternative to proposed treatment (if any)-no   Able to understand option of refusing proposed treatment (including withholding or withdrawing proposed treatment)-no   Able to appreciate reasonably foreseeable consequences of accepting proposed treatment-no  After thorough assessment, it is our clinical opinion-  Capacity is not competency. Competency is determined by legal system and judge. Capacity can vary from time to time depending on the mental status of the patient.  Past Psychiatric History: see h&P Family History:  Family History  Problem Relation Age of Onset   Diabetes Mother    CAD Mother    Lung cancer Father    Social History:  Social History   Substance and Sexual Activity  Alcohol Use Yes   Comment: beer occasionally     Social History   Substance and Sexual Activity  Drug Use No    Social History   Socioeconomic History   Marital status: Widowed    Spouse name: Not on file   Number of children: Not on file   Years of education: Not on file   Highest education level: Not on file  Occupational History   Not on file  Tobacco Use  Smoking status: Every Day    Current packs/day: 0.50    Average packs/day: 0.5 packs/day for 40.0 years (20.0 ttl pk-yrs)    Types: Cigarettes   Smokeless tobacco: Never  Vaping Use   Vaping status: Never Used  Substance and Sexual Activity   Alcohol use: Yes    Comment: beer occasionally   Drug use: No   Sexual activity: Not Currently    Birth control/protection: Post-menopausal   Other Topics Concern   Not on file  Social History Narrative   Not on file   Social Drivers of Health   Tobacco Use: High Risk (11/02/2024)   Patient History    Smoking Tobacco Use: Every Day    Smokeless Tobacco Use: Never    Passive Exposure: Not on file  Financial Resource Strain: Not on file  Food Insecurity: No Food Insecurity (10/17/2024)   Epic    Worried About Programme Researcher, Broadcasting/film/video in the Last Year: Never true    Ran Out of Food in the Last Year: Never true  Transportation Needs: No Transportation Needs (10/17/2024)   Epic    Lack of Transportation (Medical): No    Lack of Transportation (Non-Medical): No  Recent Concern: Transportation Needs - Unmet Transportation Needs (10/14/2024)   Epic    Lack of Transportation (Medical): Yes    Lack of Transportation (Non-Medical): Yes  Physical Activity: Not on file  Stress: Not on file  Social Connections: Unknown (10/17/2024)   Social Connection and Isolation Panel    Frequency of Communication with Friends and Family: Patient unable to answer    Frequency of Social Gatherings with Friends and Family: Patient unable to answer    Attends Religious Services: Patient unable to answer    Active Member of Clubs or Organizations: Patient declined    Attends Banker Meetings: Patient unable to answer    Marital Status: Widowed  Depression (PHQ2-9): Not on file  Alcohol Screen: Low Risk (10/17/2024)   Alcohol Screen    Last Alcohol Screening Score (AUDIT): 0  Housing: Low Risk (10/17/2024)   Epic    Unable to Pay for Housing in the Last Year: No    Number of Times Moved in the Last Year: 0    Homeless in the Last Year: No  Utilities: Not At Risk (10/17/2024)   Epic    Threatened with loss of utilities: No  Health Literacy: Not on file   Past Medical History:  Past Medical History:  Diagnosis Date   Eye globe prosthesis    GERD (gastroesophageal reflux disease)    History of blood transfusion 1973   related  to abscess burst in my stomach   Hyperlipemia    Hyperlipidemia    Hypertension    Osteoarthritis of back    Lowerback    SVD (spontaneous vaginal delivery)    x 1, baby died at 2 wks of age   Type II diabetes mellitus (HCC)     Past Surgical History:  Procedure Laterality Date   APPENDECTOMY     COLONOSCOPY     DILATATION & CURETTAGE/HYSTEROSCOPY WITH MYOSURE N/A 02/25/2015   Procedure: DILATATION & CURETTAGE/HYSTEROSCOPY WITH MYOSURE;  Surgeon: Truman Corona, MD;  Location: [facility];  Service: Gynecology;  Laterality: N/A;   DILATION AND CURETTAGE OF UTERUS     ENUCLEATION Right 03/16/2017   ENUCLEATION Right 03/16/2017   Procedure: ENUCLEATION RIGHT EYE;  Surgeon: Loyd Kathryne Palm, MD;  Location: [facility] OR;  Service: Ophthalmology;  Laterality: Right;  EYE SURGERY     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6, 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   EYE SURGERY Right ~ 1974   S/P initial eye injury; scissors stuck in my eye   LAPAROSCOPIC CHOLECYSTECTOMY     SHOULDER ARTHROSCOPY WITH ROTATOR CUFF REPAIR Left    wire stitches per patient   SHOULDER ARTHROSCOPY WITH ROTATOR CUFF REPAIR Right     Current Medications: Current Facility-Administered Medications  Medication Dose Route Frequency Provider Last Rate Last Admin   acetaminophen  (TYLENOL ) tablet 650 mg  650 mg Oral Q6H PRN Tex Drilling, NP   650 mg at 11/27/24 0943   alum & mag hydroxide-simeth (MAALOX/MYLANTA) 200-200-20 MG/5ML suspension 30 mL  30 mL Oral Q4H PRN Tex Drilling, NP       cloNIDine  (CATAPRES ) tablet 0.1 mg  0.1 mg Oral BID PRN Tex Drilling, NP       divalproex  (DEPAKOTE ) DR tablet 1,500 mg  1,500 mg Oral QHS Thersea Manfredonia, MD   1,500 mg at 11/27/24 2128   divalproex  (DEPAKOTE ) DR tablet 500 mg  500 mg Oral q AM Leon Montoya, MD   500 mg at 11/28/24 0805   losartan  (COZAAR ) tablet 25 mg  25 mg Oral Daily Nkwenti, Doris, NP   25 mg at 11/28/24 0805   magnesium  hydroxide (MILK OF MAGNESIA) suspension 30 mL  30 mL Oral Daily PRN Tex Drilling, NP       menthol  (CEPACOL) lozenge 3 mg  1 lozenge Oral PRN Bobbitt, Shalon E, NP   3 mg at 11/28/24 0816   methimazole  (TAPAZOLE ) tablet 10 mg  10 mg Oral Daily Nkwenti, Doris, NP   10 mg at 11/28/24 0805   OLANZapine  (ZYPREXA ) injection 5 mg  5 mg Intramuscular TID PRN Tex Drilling, NP       OLANZapine  (ZYPREXA ) injection 5 mg  5 mg Intramuscular TID PRN Tex Drilling, NP       OLANZapine  (ZYPREXA ) tablet 20 mg  20 mg Oral QHS Chayden Garrelts, MD   20 mg at 11/27/24 2128   OLANZapine  zydis (ZYPREXA ) disintegrating tablet 5 mg  5 mg Oral TID PRN Tex Drilling, NP       OLANZapine  zydis (ZYPREXA ) disintegrating tablet 5 mg  5 mg Oral TID PRN Tex Drilling, NP       pantoprazole  (PROTONIX ) EC tablet 40 mg  40 mg Oral Daily Nkwenti, Doris, NP   40 mg at 11/28/24 0805   traZODone  (DESYREL ) tablet 50 mg  50 mg Oral QHS PRN Tex Drilling, NP   50 mg at 11/26/24 2019    Lab Results:  No results found for this or any previous visit (from the past 48 hours).       Blood Alcohol level:  Lab Results  Component Value Date   [facility] <15 10/14/2024   ETH <10 07/02/2022    Metabolic Disorder Labs: Lab Results  Component Value Date   HGBA1C 5.5 10/14/2024   MPG 111.15 10/14/2024   MPG 137 01/06/2022   Lab Results  Component Value Date   PROLACTIN 5.8 10/14/2024   Lab Results  Component Value Date   CHOL 167 10/14/2024   TRIG 68 10/14/2024   HDL 60 10/14/2024   CHOLHDL 2.8 10/14/2024   VLDL 14 10/14/2024   LDLCALC 93 10/14/2024   LDLCALC 73 03/13/2024    Physical Findings: AIMS:  , ,  ,  ,    CIWA:    COWS:      Psychiatric Specialty Exam:  Presentation  General Appearance:  Appropriate for Environment  Eye Contact: Fleeting  Speech: Normal Rate  Speech Volume: Decreased    Mood and Affect  Mood:fine  Affect: Flat   Thought Process  Thought Processes: impoverished  Orientation:Partial  Thought Content: Chronic delusions Hallucinations:  Denies  Ideas of Reference:Delusions; Paranoia  Suicidal Thoughts: Denies  Homicidal Thoughts: Denies   Sensorium  Memory: Immediate Fair; Recent Fair  Judgment: Impaired  Insight: Shallow   Executive Functions  Concentration: Fair  Attention Span: Fair  Recall: Poor  Fund of Knowledge: Fair  Language: Fair   Psychomotor Activity  Psychomotor Activity: No data recorded  Musculoskeletal: Strength & Muscle Tone: within normal limits Gait & Station: normal Assets  Assets: Manufacturing Systems Engineer; Desire for Improvement; Social Support    Physical Exam: Physical Exam Vitals and nursing note reviewed.    ROS Blood pressure (!) 129/50, pulse 85, temperature 97.9 F (36.6 C), resp. rate 14, height 5' 8 (1.727 m), weight 48.3 kg, SpO2 99%. Body mass index is 16.19 kg/m.  Diagnosis: Principal Problem:   Schizophrenia, paranoid (HCC) Major neurocognitive disorder-slums total score of 13/30   Treatment Plan Summary: APS referral has been made as patient lacks capacity to make medical decisions.  Recommending legal guardianship and further placement at appropriate level of care  Safety and Monitoring:             -- Involuntary admission to inpatient psychiatric unit for safety, stabilization and treatment             -- Daily contact with patient to assess and evaluate symptoms and progress in treatment             -- Patient's case to be discussed in multi-disciplinary team meeting             -- Observation Level: q15 minute checks             -- Vital signs:  q12 hours             -- Precautions: suicide, elopement, and assault   2. Psychiatric Diagnoses and Treatment:  Check Depakote  levels 11/20/2024-60           Depakote  changed to 500 mg every morning and 1500 mg nightly-in next 5 days 11/13/2024 Depakote  level-44 Zyprexa  20 mg nightly BMP-within normal limits Occupational Therapy evaluation for assessing appropriate level of care -- The  risks/benefits/side-effects/alternatives to this medication were discussed in detail with the patient and time was given for questions. The patient consents to medication trial.                -- Metabolic profile and EKG monitoring obtained while on an atypical antipsychotic (BMI: Lipid Panel: HbgA1c: QTc:)              -- Encouraged patient to participate in unit milieu and in scheduled group therapies   Occupational Therapy recommendations If plan is discharge home, recommend the following:   Direct supervision/assist for medications management;Direct supervision/assist for financial management;Assist for transportation;Assistance with cooking/housework    Ms Branca was seen for OT treatment on this date. Upon arrival to room pt in dayroom, agreeable to tx. The SLUMS is a 30-point screening questionnaire that tests orientation, memory, attention, problem solving, and executive function. Pt scored a 13/30 indicating Dementia. Of note, it is not within occupational therapy scope of practice to diagnose cognitive impairments, this screen indicates need for further testing. Pt with noted impairments in memory and problem solving limiting ability  to participate functionally in medication management, bill management, and safety cooking. Will continue to follow POC. Continue to recommend SUPERVISION for safety with iADLs.    [facility] Mental Status Examination Orientation: 2/3  Calculations: 0/3 Naming animals: 2/3 Patient named 14 animals (0 points is 0-4 animals; 1 is 5-9 animals; 2 is 10-14 animals; 3 is 15+ animals) Recall: 3/5  Attention: 1/2 Clock drawing: 1/4 Visual Processing: 2/2 Paragraph Memory: 2/8  Total: 13/30;  Given that patient has completed high school, this score falls in the dementia range.   4. Discharge Planning:   -- Social work and case management to assist with discharge planning and identification of hospital follow-up needs prior to discharge  -- Estimated  LOS: 3-4 days  Allyn Foil, MD 11/28/2024, 12:24 PM

## 2024-11-28 NOTE — Progress Notes (Signed)
" °   11/28/24 0805  Psych Admission Type (Psych Patients Only)  Admission Status Involuntary  Psychosocial Assessment  Patient Complaints None  Eye Contact Brief  Facial Expression Flat  Affect Appropriate to circumstance  Speech Soft  Interaction Minimal  Motor Activity Slow  Appearance/Hygiene Unremarkable  Behavior Characteristics Cooperative;Appropriate to situation  Mood Pleasant  Aggressive Behavior  Effect No apparent injury  Thought Process  Coherency WDL  Content WDL  Delusions None reported or observed  Perception WDL  Hallucination None reported or observed  Judgment Impaired  Confusion Mild  Danger to Self  Current suicidal ideation? Denies  Agreement Not to Harm Self Yes  Description of Agreement Verbal  Danger to Others  Danger to Others None reported or observed    "

## 2024-11-28 NOTE — Group Note (Signed)
 [facility] LCSW Group Therapy Note   Group Date: 11/28/2024 Start Time: 1300 End Time: 1400  Type of Therapy/Topic:  Group Therapy:  Feelings about Diagnosis  Participation Level:  Minimal   Mood: Flat   Description of Group:    This group will allow patients to explore their thoughts and feelings about diagnoses they have received. Patients will be guided to explore their level of understanding and acceptance of these diagnoses. Facilitator will encourage patients to process their thoughts and feelings about the reactions of others to their diagnosis, and will guide patients in identifying ways to discuss their diagnosis with significant others in their lives. This group will be process-oriented, with patients participating in exploration of their own experiences as well as giving and receiving support and challenge from other group members.   Therapeutic Goals: 1. Patient will demonstrate understanding of diagnosis as evidence by identifying two or more symptoms of the disorder:  2. Patient will be able to express two feelings regarding the diagnosis 3. Patient will demonstrate ability to communicate their needs through discussion and/or role plays  Summary of Patient Progress: Participants reflected on their emotions related to the holidays, using the occasion as a chance to engage in a life review.  Patients played and sang their most cherished nostalgic songs to evoke a sense of comfort and happiness.  Patients created a supportive environment where everyone felt safe to open up, share, and explore their emotions.    Therapeutic Modalities:   Cognitive Behavioral Therapy Brief Therapy Feelings Identification    Alveta CHRISTELLA Kerns, LCSW

## 2024-11-28 NOTE — Progress Notes (Signed)
" °   11/28/24 0002  Psych Admission Type (Psych Patients Only)  Admission Status Involuntary  Psychosocial Assessment  Patient Complaints None  Eye Contact Brief  Facial Expression Flat  Affect Appropriate to circumstance  Speech Soft  Interaction Minimal  Motor Activity Slow  Appearance/Hygiene Unremarkable  Behavior Characteristics Cooperative  Mood Pleasant  Thought Process  Coherency WDL  Content WDL  Delusions None reported or observed  Perception WDL  Hallucination None reported or observed  Judgment Impaired  Confusion Mild  Danger to Self  Current suicidal ideation? Denies  Agreement Not to Harm Self Yes  Description of Agreement Verbal  Danger to Others  Danger to Others None reported or observed    "

## 2024-11-28 NOTE — Plan of Care (Signed)
 Shari [name] is a 68 y.o. female patient. No diagnosis found. Past Medical History:  Diagnosis Date   Eye globe prosthesis    GERD (gastroesophageal reflux disease)    History of blood transfusion 1973   related to abscess burst in my stomach   Hyperlipemia    Hyperlipidemia    Hypertension    Osteoarthritis of back    Lowerback    SVD (spontaneous vaginal delivery)    x 1, baby died at 2 wks of age   Type II diabetes mellitus (HCC)    Current Facility-Administered Medications  Medication Dose Route Frequency Provider Last Rate Last Admin   acetaminophen  (TYLENOL ) tablet 650 mg  650 mg Oral Q6H PRN Tex Drilling, NP   650 mg at 11/27/24 0943   alum & mag hydroxide-simeth (MAALOX/MYLANTA) 200-200-20 MG/5ML suspension 30 mL  30 mL Oral Q4H PRN Tex Drilling, NP       cloNIDine  (CATAPRES ) tablet 0.1 mg  0.1 mg Oral BID PRN Tex Drilling, NP       divalproex  (DEPAKOTE ) DR tablet 1,500 mg  1,500 mg Oral QHS Jadapalle, Sree, MD   1,500 mg at 11/27/24 2128   divalproex  (DEPAKOTE ) DR tablet 500 mg  500 mg Oral q AM Jadapalle, Sree, MD   500 mg at 11/27/24 9075   losartan  (COZAAR ) tablet 25 mg  25 mg Oral Daily Nkwenti, Doris, NP   25 mg at 11/27/24 9075   magnesium  hydroxide (MILK OF MAGNESIA) suspension 30 mL  30 mL Oral Daily PRN Tex Drilling, NP       menthol  (CEPACOL) lozenge 3 mg  1 lozenge Oral PRN Bobbitt, Shalon E, NP   3 mg at 11/27/24 0944   methimazole  (TAPAZOLE ) tablet 10 mg  10 mg Oral Daily Nkwenti, Doris, NP   10 mg at 11/27/24 9075   OLANZapine  (ZYPREXA ) injection 5 mg  5 mg Intramuscular TID PRN Tex Drilling, NP       OLANZapine  (ZYPREXA ) injection 5 mg  5 mg Intramuscular TID PRN Tex Drilling, NP       OLANZapine  (ZYPREXA ) tablet 20 mg  20 mg Oral QHS Jadapalle, Sree, MD   20 mg at 11/27/24 2128   OLANZapine  zydis (ZYPREXA ) disintegrating tablet 5 mg  5 mg Oral TID PRN Tex Drilling, NP       OLANZapine  zydis (ZYPREXA ) disintegrating tablet 5 mg  5 mg Oral TID  PRN Tex Drilling, NP       pantoprazole  (PROTONIX ) EC tablet 40 mg  40 mg Oral Daily Nkwenti, Doris, NP   40 mg at 11/27/24 9075   traZODone  (DESYREL ) tablet 50 mg  50 mg Oral QHS PRN Tex Drilling, NP   50 mg at 11/26/24 2019   Allergies[1] Principal Problem:   Schizophrenia, paranoid (HCC)  Blood pressure 137/63, pulse 85, temperature 99 F (37.2 C), resp. rate 16, height 5' 8 (1.727 m), weight 48.3 kg, SpO2 99%.    Shari [name] 11/28/2024      [1]  Allergies Allergen Reactions   Aspirin Hives and Itching   Chocolate Hives and Itching   Cocoa Itching and Rash   Penicillins Hives, Itching, Swelling and Other (See Comments)    Has patient had a PCN reaction causing IMMEDIATE RASH, FACIAL/TONGUE/THROAT SWELLING, SOB, OR LIGHTHEADEDNESS WITH HYPOTENSION:  #  #  #  YES  #  #  #  Has patient had a PCN reaction causing severe rash involving mucus membranes or skin necrosis: No Has patient had  a PCN reaction that required hospitalization No Has patient had a PCN reaction occurring within the last 10 years: No If all of the above answers are NO, then may proceed with Cephalosporin use.    Sulfa Antibiotics Hives and Itching

## 2024-11-29 DIAGNOSIS — F0390 Unspecified dementia without behavioral disturbance: Secondary | ICD-10-CM | POA: Diagnosis not present

## 2024-11-29 DIAGNOSIS — F200 Paranoid schizophrenia: Secondary | ICD-10-CM | POA: Diagnosis not present

## 2024-11-29 NOTE — Group Note (Signed)
 Date:  11/29/2024 Time:  9:28 PM  Group Topic/Focus:  Wrap-Up Group:   The focus of this group is to help patients review their daily goal of treatment and discuss progress on daily workbooks.    Participation Level:  Active  Participation Quality:  Appropriate  Affect:  Appropriate  Cognitive:  Alert  Insight: Appropriate  Engagement in Group:  Engaged  Modes of Intervention:  Discussion  Additional Comments:    Shari [name] Bunker 11/29/2024, 9:28 PM

## 2024-11-29 NOTE — Progress Notes (Signed)
 Behavior:  Mild confusion.  Pleasant and cooperative.    Psych assessment:  Denies SI/HI and AVH.    Interaction / Group attendance:  Present in the milieu.  Minimal interaction with peers and staff.   Medication/ PRNs: Compliant.  Pain: Denies.  15 min checks in place for safety.

## 2024-11-29 NOTE — Progress Notes (Signed)
 [facility] MD Progress Note  11/29/2024 12:16 PM Shari [name]  MRN:  [id number] Patient is a 68 year old female with a history of Schizoaffective Disorder, Bipolar Type who presents via [employer] under IVC, initiated by CM, Falencio with re-entry program, to [facility] for assessment. Per IVC, [name] has been diagnosed with Schizophrenia and is refusing to take medication. [name] isn't sleeping. Residents report her screaming and yelling all night long.  [name] responding to internal stimuli.  [name] verbally aggressive.  The respondent has locked all of the residents inside of the transitional housing home.  They could not leave the home which prompted the IVC.  Residents did not have access to food, the restroom or anything as a result of being locked in.  [name] is currently under Adult protective services care. Patient is admitted to [facility] unit with Q15 min safety monitoring. Multidisciplinary team approach is offered. Medication management; group/milieu therapy is offered.   Subjective:  Chart reviewed, case discussed in multidisciplinary meeting, patient seen during rounds.   Patient is noted to be resting in bed.  Patient did complain about having problems with swallowing and he has history of hypothyroidism/Graves' disease.  Provider discussed about getting an ultrasound of thyroid .  Patient denies having any physical complaints.  Patient has chronic delusions about her living situation.  Patient denies SI/HI/plan and denies hallucinations.  [name], DSS caseworker, [phone]  was granted ex-parte until guardianship is granted       Able to understand medical problem-patient remains paranoid and delusional and has no understanding of the events that led up to her admission including her housing situation which she continues to claim as her own house  Able to understand proposed treatment-patient has no understanding of the treatment options and medications  that are proposed to her continues to claim that she does not require any psychotropics   Able to understand alternative to proposed treatment (if any)-no   Able to understand option of refusing proposed treatment (including withholding or withdrawing proposed treatment)-no   Able to appreciate reasonably foreseeable consequences of accepting proposed treatment-no  After thorough assessment, it is our clinical opinion-  Capacity is not competency. Competency is determined by legal system and judge. Capacity can vary from time to time depending on the mental status of the patient.  Past Psychiatric History: see h&P Family History:  Family History  Problem Relation Age of Onset   Diabetes Mother    CAD Mother    Lung cancer Father    Social History:  Social History   Substance and Sexual Activity  Alcohol Use Yes   Comment: beer occasionally     Social History   Substance and Sexual Activity  Drug Use No    Social History   Socioeconomic History   Marital status: Widowed    Spouse name: Not on file   Number of children: Not on file   Years of education: Not on file   Highest education level: Not on file  Occupational History   Not on file  Tobacco Use   Smoking status: Every Day    Current packs/day: 0.50    Average packs/day: 0.5 packs/day for 40.0 years (20.0 ttl pk-yrs)    Types: Cigarettes   Smokeless tobacco: Never  Vaping Use   Vaping status: Never Used  Substance and Sexual Activity   Alcohol use: Yes    Comment: beer occasionally   Drug use: No   Sexual activity: Not Currently  Birth control/protection: Post-menopausal  Other Topics Concern   Not on file  Social History Narrative   Not on file   Social Drivers of Health   Tobacco Use: High Risk (11/02/2024)   Patient History    Smoking Tobacco Use: Every Day    Smokeless Tobacco Use: Never    Passive Exposure: Not on file  Financial Resource Strain: Not on file  Food Insecurity: No Food  Insecurity (10/17/2024)   Epic    Worried About Programme Researcher, Broadcasting/film/video in the Last Year: Never true    Ran Out of Food in the Last Year: Never true  Transportation Needs: No Transportation Needs (10/17/2024)   Epic    Lack of Transportation (Medical): No    Lack of Transportation (Non-Medical): No  Recent Concern: Transportation Needs - Unmet Transportation Needs (10/14/2024)   Epic    Lack of Transportation (Medical): Yes    Lack of Transportation (Non-Medical): Yes  Physical Activity: Not on file  Stress: Not on file  Social Connections: Unknown (10/17/2024)   Social Connection and Isolation Panel    Frequency of Communication with Friends and Family: Patient unable to answer    Frequency of Social Gatherings with Friends and Family: Patient unable to answer    Attends Religious Services: Patient unable to answer    Active Member of Clubs or Organizations: Patient declined    Attends Banker Meetings: Patient unable to answer    Marital Status: Widowed  Depression (PHQ2-9): Not on file  Alcohol Screen: Low Risk (10/17/2024)   Alcohol Screen    Last Alcohol Screening Score (AUDIT): 0  Housing: Low Risk (10/17/2024)   Epic    Unable to Pay for Housing in the Last Year: No    Number of Times Moved in the Last Year: 0    Homeless in the Last Year: No  Utilities: Not At Risk (10/17/2024)   Epic    Threatened with loss of utilities: No  Health Literacy: Not on file   Past Medical History:  Past Medical History:  Diagnosis Date   Eye globe prosthesis    GERD (gastroesophageal reflux disease)    History of blood transfusion 1973   related to abscess burst in my stomach   Hyperlipemia    Hyperlipidemia    Hypertension    Osteoarthritis of back    Lowerback    SVD (spontaneous vaginal delivery)    x 1, baby died at 2 wks of age   Type II diabetes mellitus (HCC)     Past Surgical History:  Procedure Laterality Date   APPENDECTOMY     COLONOSCOPY      DILATATION & CURETTAGE/HYSTEROSCOPY WITH MYOSURE N/A 02/25/2015   Procedure: DILATATION & CURETTAGE/HYSTEROSCOPY WITH MYOSURE;  Surgeon: Truman Corona, MD;  Location: [facility];  Service: Gynecology;  Laterality: N/A;   DILATION AND CURETTAGE OF UTERUS     ENUCLEATION Right 03/16/2017   ENUCLEATION Right 03/16/2017   Procedure: ENUCLEATION RIGHT EYE;  Surgeon: Loyd Kathryne Palm, MD;  Location: [facility] OR;  Service: Ophthalmology;  Laterality: Right;   EYE SURGERY     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6, 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   EYE SURGERY Right ~ 1974   S/P initial eye injury; scissors stuck in my eye   LAPAROSCOPIC CHOLECYSTECTOMY     SHOULDER ARTHROSCOPY WITH ROTATOR CUFF REPAIR Left    wire stitches per patient   SHOULDER ARTHROSCOPY WITH ROTATOR CUFF REPAIR Right     Current Medications:  Current Facility-Administered Medications  Medication Dose Route Frequency Provider Last Rate Last Admin   acetaminophen  (TYLENOL ) tablet 650 mg  650 mg Oral Q6H PRN Tex Drilling, NP   650 mg at 11/28/24 2117   alum & mag hydroxide-simeth (MAALOX/MYLANTA) 200-200-20 MG/5ML suspension 30 mL  30 mL Oral Q4H PRN Tex Drilling, NP       cloNIDine  (CATAPRES ) tablet 0.1 mg  0.1 mg Oral BID PRN Tex Drilling, NP       divalproex  (DEPAKOTE ) DR tablet 1,500 mg  1,500 mg Oral QHS Talan Gildner, MD   1,500 mg at 11/28/24 2118   divalproex  (DEPAKOTE ) DR tablet 500 mg  500 mg Oral q AM Jaelynn Currier, MD   500 mg at 11/29/24 9088   losartan  (COZAAR ) tablet 25 mg  25 mg Oral Daily Nkwenti, Doris, NP   25 mg at 11/29/24 0911   magnesium  hydroxide (MILK OF MAGNESIA) suspension 30 mL  30 mL Oral Daily PRN Tex Drilling, NP       menthol  (CEPACOL) lozenge 3 mg  1 lozenge Oral PRN Bobbitt, Shalon E, NP   3 mg at 11/28/24 2119   methimazole  (TAPAZOLE ) tablet 10 mg  10 mg Oral Daily Nkwenti, Doris, NP   10 mg at 11/29/24 9088   OLANZapine  (ZYPREXA ) injection 5 mg  5 mg Intramuscular TID PRN Tex Drilling, NP       OLANZapine  (ZYPREXA )  injection 5 mg  5 mg Intramuscular TID PRN Tex Drilling, NP       OLANZapine  (ZYPREXA ) tablet 20 mg  20 mg Oral QHS Louay Myrie, MD   20 mg at 11/28/24 2117   OLANZapine  zydis (ZYPREXA ) disintegrating tablet 5 mg  5 mg Oral TID PRN Tex Drilling, NP       OLANZapine  zydis (ZYPREXA ) disintegrating tablet 5 mg  5 mg Oral TID PRN Tex Drilling, NP       pantoprazole  (PROTONIX ) EC tablet 40 mg  40 mg Oral Daily Nkwenti, Doris, NP   40 mg at 11/29/24 0911   traZODone  (DESYREL ) tablet 50 mg  50 mg Oral QHS PRN Tex Drilling, NP   50 mg at 11/28/24 2117    Lab Results:  No results found for this or any previous visit (from the past 48 hours).       Blood Alcohol level:  Lab Results  Component Value Date   [facility] <15 10/14/2024   ETH <10 07/02/2022    Metabolic Disorder Labs: Lab Results  Component Value Date   HGBA1C 5.5 10/14/2024   MPG 111.15 10/14/2024   MPG 137 01/06/2022   Lab Results  Component Value Date   PROLACTIN 5.8 10/14/2024   Lab Results  Component Value Date   CHOL 167 10/14/2024   TRIG 68 10/14/2024   HDL 60 10/14/2024   CHOLHDL 2.8 10/14/2024   VLDL 14 10/14/2024   LDLCALC 93 10/14/2024   LDLCALC 73 03/13/2024    Physical Findings: AIMS:  , ,  ,  ,    CIWA:    COWS:      Psychiatric Specialty Exam:  Presentation  General Appearance:  Appropriate for Environment  Eye Contact: Fleeting  Speech: Normal Rate  Speech Volume: Decreased    Mood and Affect  Mood:fine  Affect: Flat   Thought Process  Thought Processes: impoverished  Orientation:Partial  Thought Content: Chronic delusions Hallucinations: Denies  Ideas of Reference:Delusions; Paranoia  Suicidal Thoughts: Denies  Homicidal Thoughts: Denies   Sensorium  Memory: Immediate Fair; Recent Fair  Judgment: Impaired  Insight: Shallow   Executive Functions  Concentration: Fair  Attention Span: Fair  Recall: Poor  Fund of  Knowledge: Fair  Language: Fair   Psychomotor Activity  Psychomotor Activity: No data recorded  Musculoskeletal: Strength & Muscle Tone: within normal limits Gait & Station: normal Assets  Assets: Manufacturing Systems Engineer; Desire for Improvement; Social Support    Physical Exam: Physical Exam Vitals and nursing note reviewed.    ROS Blood pressure 132/86, pulse 91, temperature 98.4 F (36.9 C), resp. rate 14, height 5' 8 (1.727 m), weight 48.3 kg, SpO2 97%. Body mass index is 16.19 kg/m.  Diagnosis: Principal Problem:   Schizophrenia, paranoid (HCC) Major neurocognitive disorder-slums total score of 13/30   Treatment Plan Summary: APS referral has been made as patient lacks capacity to make medical decisions.  Recommending legal guardianship and further placement at appropriate level of care  Safety and Monitoring:             -- Involuntary admission to inpatient psychiatric unit for safety, stabilization and treatment             -- Daily contact with patient to assess and evaluate symptoms and progress in treatment             -- Patient's case to be discussed in multi-disciplinary team meeting             -- Observation Level: q15 minute checks             -- Vital signs:  q12 hours             -- Precautions: suicide, elopement, and assault   2. Psychiatric Diagnoses and Treatment:  Check Depakote  levels 11/20/2024-60           Depakote  changed to 500 mg every morning and 1500 mg nightly-in next 5 days 11/13/2024 Depakote  level-44 Zyprexa  20 mg nightly BMP-within normal limits Occupational Therapy evaluation for assessing appropriate level of care -- The risks/benefits/side-effects/alternatives to this medication were discussed in detail with the patient and time was given for questions. The patient consents to medication trial.                -- Metabolic profile and EKG monitoring obtained while on an atypical antipsychotic (BMI: Lipid Panel: HbgA1c: QTc:)               -- Encouraged patient to participate in unit milieu and in scheduled group therapies   Occupational Therapy recommendations If plan is discharge home, recommend the following:   Direct supervision/assist for medications management;Direct supervision/assist for financial management;Assist for transportation;Assistance with cooking/housework    Ms Giorgio was seen for OT treatment on this date. Upon arrival to room pt in dayroom, agreeable to tx. The SLUMS is a 30-point screening questionnaire that tests orientation, memory, attention, problem solving, and executive function. Pt scored a 13/30 indicating Dementia. Of note, it is not within occupational therapy scope of practice to diagnose cognitive impairments, this screen indicates need for further testing. Pt with noted impairments in memory and problem solving limiting ability to participate functionally in medication management, bill management, and safety cooking. Will continue to follow POC. Continue to recommend SUPERVISION for safety with iADLs.    [facility] Mental Status Examination Orientation: 2/3  Calculations: 0/3 Naming animals: 2/3 Patient named 14 animals (0 points is 0-4 animals; 1 is 5-9 animals; 2 is 10-14 animals; 3 is 15+ animals) Recall: 3/5  Attention: 1/2 Clock drawing: 1/4 Visual Processing:  2/2 Paragraph Memory: 2/8  Total: 13/30;  Given that patient has completed high school, this score falls in the dementia range.   4. Discharge Planning:   -- Social work and case management to assist with discharge planning and identification of hospital follow-up needs prior to discharge  -- Estimated LOS: 3-4 days  Allyn Foil, MD 11/29/2024, 12:16 PM

## 2024-11-29 NOTE — Plan of Care (Signed)
  Problem: Education: Goal: Verbalization of understanding the information provided will improve Outcome: Progressing   Problem: Activity: Goal: Interest or engagement in activities will improve Outcome: Progressing   Problem: Coping: Goal: Ability to demonstrate self-control will improve Outcome: Progressing   

## 2024-11-29 NOTE — BH IP Treatment Plan (Signed)
 Interdisciplinary Treatment and Diagnostic Plan Update  11/29/2024 Time of Session: 2:45PM Shari [name] MRN: [id number]  Principal Diagnosis: Schizophrenia, paranoid (HCC)  Secondary Diagnoses: Principal Problem:   Schizophrenia, paranoid (HCC)   Current Medications:  Current Facility-Administered Medications  Medication Dose Route Frequency Provider Last Rate Last Admin   acetaminophen  (TYLENOL ) tablet 650 mg  650 mg Oral Q6H PRN Shari Drilling, NP   650 mg at 11/28/24 2117   alum & mag hydroxide-simeth (MAALOX/MYLANTA) 200-200-20 MG/5ML suspension 30 mL  30 mL Oral Q4H PRN Shari Drilling, NP       cloNIDine  (CATAPRES ) tablet 0.1 mg  0.1 mg Oral BID PRN Shari Drilling, NP       divalproex  (DEPAKOTE ) DR tablet 1,500 mg  1,500 mg Oral QHS Shari, Sree, MD   1,500 mg at 11/28/24 2118   divalproex  (DEPAKOTE ) DR tablet 500 mg  500 mg Oral q AM Shari, Sree, MD   500 mg at 11/29/24 9088   losartan  (COZAAR ) tablet 25 mg  25 mg Oral Daily Shari, Doris, NP   25 mg at 11/29/24 0911   magnesium  hydroxide (MILK OF MAGNESIA) suspension 30 mL  30 mL Oral Daily PRN Shari Drilling, NP       menthol  (CEPACOL) lozenge 3 mg  1 lozenge Oral PRN Shari, Shalon E, NP   3 mg at 11/28/24 2119   methimazole  (TAPAZOLE ) tablet 10 mg  10 mg Oral Daily Shari, Doris, NP   10 mg at 11/29/24 9088   OLANZapine  (ZYPREXA ) injection 5 mg  5 mg Intramuscular TID PRN Shari Drilling, NP       OLANZapine  (ZYPREXA ) injection 5 mg  5 mg Intramuscular TID PRN Shari Drilling, NP       OLANZapine  (ZYPREXA ) tablet 20 mg  20 mg Oral QHS Shari, Sree, MD   20 mg at 11/28/24 2117   OLANZapine  zydis (ZYPREXA ) disintegrating tablet 5 mg  5 mg Oral TID PRN Shari Drilling, NP       OLANZapine  zydis (ZYPREXA ) disintegrating tablet 5 mg  5 mg Oral TID PRN Shari Drilling, NP       pantoprazole  (PROTONIX ) EC tablet 40 mg  40 mg Oral Daily Shari, Doris, NP   40 mg at 11/29/24 0911   traZODone  (DESYREL ) tablet 50 mg  50 mg  Oral QHS PRN Shari Drilling, NP   50 mg at 11/28/24 2117   PTA Medications: Medications Prior to Admission  Medication Sig Dispense Refill Last Dose/Taking   Ascorbic Acid (VITAMIN C PO) Take 1 tablet by mouth daily. (Patient not taking: Reported on 10/14/2024)      divalproex  (DEPAKOTE ) 500 MG DR tablet Take 1 tablet (500 mg total) by mouth at bedtime. (Patient not taking: Reported on 10/14/2024) 30 tablet 1    losartan  (COZAAR ) 25 MG tablet Take 1 tablet (25 mg total) by mouth daily. (Patient not taking: Reported on 10/14/2024) 90 tablet 1    methimazole  (TAPAZOLE ) 10 MG tablet Take 1 tablet (10 mg total) by mouth daily. (Patient not taking: Reported on 10/14/2024) 90 tablet 1    OLANZapine  (ZYPREXA ) 10 MG tablet Take 1 tablet (10 mg total) by mouth at bedtime. (Patient not taking: Reported on 10/14/2024) 30 tablet 1    pantoprazole  (PROTONIX ) 40 MG tablet Take 1 tablet (40 mg total) by mouth daily. (Patient not taking: Reported on 10/14/2024) 90 tablet 0    propranolol  (INDERAL ) 10 MG tablet Take 1 tablet (10 mg total) by mouth 2 (two) times daily. (Patient not  taking: Reported on 10/14/2024) 180 tablet 1    VITAMIN D PO Take 1 tablet by mouth daily. (Patient not taking: Reported on 10/14/2024)      VITAMIN [name] PO Take 1 tablet by mouth daily. (Patient not taking: Reported on 10/14/2024)       Patient Stressors: Medication change or noncompliance    Patient Strengths: Communication skills   Treatment Modalities: Medication Management, Group therapy, Case management,  1 to 1 session with clinician, Psychoeducation, Recreational therapy.   Physician Treatment Plan for Primary Diagnosis: Schizophrenia, paranoid (HCC) Long Term Goal(s): Improvement in symptoms so as ready for discharge   Short Term Goals: Ability to identify changes in lifestyle to reduce recurrence of condition will improve Ability to verbalize feelings will improve Ability to disclose and discuss suicidal ideas Ability  to demonstrate self-control will improve Ability to identify and develop effective coping behaviors will improve Ability to maintain clinical measurements within normal limits will improve  Medication Management: Evaluate patient's response, side effects, and tolerance of medication regimen.  Therapeutic Interventions: 1 to 1 sessions, Unit Group sessions and Medication administration.  Evaluation of Outcomes: Progressing  Physician Treatment Plan for Secondary Diagnosis: Principal Problem:   Schizophrenia, paranoid (HCC)  Long Term Goal(s): Improvement in symptoms so as ready for discharge   Short Term Goals: Ability to identify changes in lifestyle to reduce recurrence of condition will improve Ability to verbalize feelings will improve Ability to disclose and discuss suicidal ideas Ability to demonstrate self-control will improve Ability to identify and develop effective coping behaviors will improve Ability to maintain clinical measurements within normal limits will improve     Medication Management: Evaluate patient's response, side effects, and tolerance of medication regimen.  Therapeutic Interventions: 1 to 1 sessions, Unit Group sessions and Medication administration.  Evaluation of Outcomes: Progressing   RN Treatment Plan for Primary Diagnosis: Schizophrenia, paranoid (HCC) Long Term Goal(s): Knowledge of disease and therapeutic regimen to maintain health will improve  Short Term Goals: Ability to demonstrate self-control, Ability to participate in decision making will improve, Ability to verbalize feelings will improve, Ability to disclose and discuss suicidal ideas, Ability to identify and develop effective coping behaviors will improve, and Compliance with prescribed medications will improve  Medication Management: RN will administer medications as ordered by provider, will assess and evaluate patient's response and provide education to patient for prescribed  medication. RN will report any adverse and/or side effects to prescribing provider.  Therapeutic Interventions: 1 on 1 counseling sessions, Psychoeducation, Medication administration, Evaluate responses to treatment, Monitor vital signs and CBGs as ordered, Perform/monitor CIWA, COWS, AIMS and Fall Risk screenings as ordered, Perform wound care treatments as ordered.  Evaluation of Outcomes: Progressing   LCSW Treatment Plan for Primary Diagnosis: Schizophrenia, paranoid (HCC) Long Term Goal(s): Safe transition to appropriate next level of care at discharge, Engage patient in therapeutic group addressing interpersonal concerns.  Short Term Goals: Engage patient in aftercare planning with referrals and resources, Increase social support, Increase ability to appropriately verbalize feelings, Increase emotional regulation, Facilitate acceptance of mental health diagnosis and concerns, and Increase skills for wellness and recovery  Therapeutic Interventions: Assess for all discharge needs, 1 to 1 time with Social worker, Explore available resources and support systems, Assess for adequacy in community support network, Educate family and significant other(s) on suicide prevention, Complete Psychosocial Assessment, Interpersonal group therapy.  Evaluation of Outcomes: Progressing   Progress in Treatment: Attending groups: Yes. Participating in groups: Yes. Taking medication as prescribed: Yes. Toleration  medication: Yes. Family/Significant other contact made: No, will contact:  once permission has been granted  Patient understands diagnosis: No. Discussing patient identified problems/goals with staff: Yes. Medical problems stabilized or resolved: Yes. Denies suicidal/homicidal ideation: Yes. Issues/concerns per patient self-inventory: No. Other: none  New problem(s) identified: No, Describe:  None identified 10/23/24 Update: No changes at this time. 10/28/24 Update: No changes at this time.  Update 11/03/24: No changes at this time Update 11/09/24: No changes at this time Update 11/14/24: No changes at this time    11/24/24 Update: No changes at this time.  Update 11/29/2024: No changes at this time.     New Short Term/Long Term Goal(s): elimination of symptoms of psychosis, medication management for mood stabilization; elimination of SI thoughts; development of comprehensive mental wellness plan. 10/23/24 Update: No changes at this time. 10/28/24 Update: No changes at this time. Update 11/03/24: No changes at this time Update 11/09/24: No changes at this time Update 11/14/24: No changes at this time. 11/24/24 Update: No changes at this time.  Update 11/29/2024: No changes at this time.      Patient Goals:  I haven't set any goals for the hospital yet. I don't need psychiatric help 10/23/24 Update: No changes at this time. 10/28/24 Update: No changes at this time. Update 11/03/24: No changes at this time Update 11/09/24: No changes at this time Update 11/14/24: No changes at this time2/21/25 Update: No changes at this time. Update 11/29/2024: No changes at this time.      Discharge Plan or Barriers: CSW will assist with appropriate discharge plan 10/23/24 Update: No changes at this time. 10/28/24 Update: CSW to continue to meet with DSS to engage in safe discharge planning and connect patient with appropriate resources. Update 11/03/24: No changes at this time Update 11/09/24: No changes at this time Update 11/14/24: APS drafting a petition for guardianship, awaiting updates regarding scheduled court date at this time. APS has received FL2 to begin placement search. 11/24/24 Update: No changes at this time.  Update 11/29/2024: Placement continues to be sought at this time.     Reason for Continuation of Hospitalization: Delusions  Medication stabilization 10/28/24 Update: Delusions  Medication stabilization 11/24/24 Update: No changes at this time. Update 11/29/2024: No changes at this  time.     Estimated Length of Stay: 1 to 7 days 10/23/24 Update: No changes at this time. 10/28/24 Update: TBD. Update 11/03/24: TBD Update 11/09/24: TBD Update 11/14/24: TBD 11/24/24 Update: TBD Update 11/29/2024: TBD  Last 3 [place] Suicide Severity Risk Score: Flowsheet Row Admission (Current) from 10/17/2024 in [facility] from 10/14/2024 in [facility] ED to Hosp-Admission (Discharged) from 03/13/2024 in [facility]  C-SSRS RISK CATEGORY No Risk No Risk No Risk    Last PHQ 2/9 Scores:     No data to display          Scribe for Treatment Team: Sherryle JINNY Margo, KEN 11/29/2024 3:24 PM

## 2024-11-29 NOTE — BHH Counselor (Signed)
 CSW attempted to contact [name], [facility] DSS APS SW to discuss the patient's placement.  CSW left HIPAA compliant voicemail.  Sherryle Margo, MSW, LCSW 11/29/2024 1:29 PM

## 2024-11-29 NOTE — Group Note (Deleted)
 Date:  11/29/2024 Time:  8:47 PM  Group Topic/Focus:  Overcoming Stress:   The focus of this group is to define stress and help patients assess their triggers.     Participation Level:  {BHH PARTICIPATION OZCZO:77735}  Participation Quality:  {BHH PARTICIPATION QUALITY:22265}  Affect:  {BHH AFFECT:22266}  Cognitive:  {BHH COGNITIVE:22267}  Insight: {BHH Insight2:20797}  Engagement in Group:  {BHH ENGAGEMENT IN HMNLE:77731}  Modes of Intervention:  {BHH MODES OF INTERVENTION:22269}  Additional Comments:  PIERRETTE Ginny JONETTA Orma 11/29/2024, 8:47 PM

## 2024-11-29 NOTE — Group Note (Signed)
 Date:  11/29/2024 Time:  3:45 PM  Group Topic/Focus:  Meditation Therapy    Participation Level:  Did Not Attend    Shari [name] 11/29/2024, 3:45 PM

## 2024-11-29 NOTE — Group Note (Signed)
 Date:  11/29/2024 Time:  10:34 AM  Group Topic/Focus:  Movement Therapy, Morning Stretch with Nevah Dalal.    Participation Level:  Did Not Attend    [name] 11/29/2024, 10:34 AM

## 2024-11-29 NOTE — Progress Notes (Signed)
" °   11/28/24 2000  Psych Admission Type (Psych Patients Only)  Admission Status Involuntary  Psychosocial Assessment  Patient Complaints None  Eye Contact Brief  Facial Expression Flat  Affect Appropriate to circumstance  Speech Soft  Interaction Minimal  Motor Activity Slow  Appearance/Hygiene Unremarkable  Behavior Characteristics Cooperative;Appropriate to situation  Mood Pleasant  Thought Process  Coherency WDL  Content WDL  Delusions None reported or observed  Perception WDL  Hallucination None reported or observed  Judgment Impaired  Confusion Mild  Danger to Self  Current suicidal ideation? Denies  Agreement Not to Harm Self Yes  Description of Agreement verbal  Danger to Others  Danger to Others None reported or observed   Mood/Behavior:  Pleasant and cooperative.    Psych assessment: Denies SI/HI and AVH.     Interaction / Group attendance:  Present in the milieu. Minimal interaction with peers and staff.  Attended group.   Medication/ PRNs: Compliant with scheduled medications. Required PRNs Trazodone  for sleep and noted effective. Required prn Tylenol  for shoulder pain noted effective.    Pain: Denies   15 min checks in place for safety. "

## 2024-11-30 DIAGNOSIS — F200 Paranoid schizophrenia: Secondary | ICD-10-CM | POA: Diagnosis not present

## 2024-11-30 DIAGNOSIS — F0390 Unspecified dementia without behavioral disturbance: Secondary | ICD-10-CM | POA: Diagnosis not present

## 2024-11-30 NOTE — Progress Notes (Signed)
 No PRNs needed this shift. Medications as prescribed. Last [facility] 11/29/2024  11/30/24 1100  Psych Admission Type (Psych Patients Only)  Admission Status Involuntary  Psychosocial Assessment  Patient Complaints None  Eye Contact Fair  Facial Expression Flat  Affect Flat  Speech Logical/coherent  Interaction Minimal  Motor Activity Slow  Appearance/Hygiene Unremarkable  Behavior Characteristics Cooperative  Mood Pleasant  Thought Process  Coherency WDL  Content WDL  Delusions None reported or observed  Perception WDL  Hallucination None reported or observed  Judgment Impaired  Confusion Mild  Danger to Self  Current suicidal ideation? Denies  Danger to Others  Danger to Others None reported or observed

## 2024-11-30 NOTE — Progress Notes (Signed)
" °   11/30/24 0050  Psych Admission Type (Psych Patients Only)  Admission Status Voluntary  Psychosocial Assessment  Patient Complaints None  Eye Contact Fair  Facial Expression Flat  Affect Flat  Speech Logical/coherent  Interaction Minimal  Motor Activity Slow  Appearance/Hygiene Unremarkable  Behavior Characteristics Cooperative;Appropriate to situation  Mood Pleasant  Aggressive Behavior  Effect No apparent injury  Thought Process  Coherency WDL  Content WDL  Delusions None reported or observed  Perception WDL  Hallucination None reported or observed  Judgment Impaired  Confusion Mild  Danger to Self  Current suicidal ideation? Denies  Agreement Not to Harm Self Yes  Description of Agreement Verbal  Danger to Others  Danger to Others None reported or observed    "

## 2024-11-30 NOTE — Plan of Care (Signed)

## 2024-11-30 NOTE — Progress Notes (Signed)
 [facility] MD Progress Note  11/30/2024 5:54 PM TAUSHA MILHOAN  MRN:  [id number] Patient is a 68 year old female with a history of Schizoaffective Disorder, Bipolar Type who presents via [employer] under IVC, initiated by CM, Falencio with re-entry program, to [facility] for assessment. Per IVC, [name] has been diagnosed with Schizophrenia and is refusing to take medication. [name] isn't sleeping. Residents report her screaming and yelling all night long.  [name] responding to internal stimuli.  [name] verbally aggressive.  The respondent has locked all of the residents inside of the transitional housing home.  They could not leave the home which prompted the IVC.  Residents did not have access to food, the restroom or anything as a result of being locked in.  [name] is currently under Adult protective services care. Patient is admitted to [facility] unit with Q15 min safety monitoring. Multidisciplinary team approach is offered. Medication management; group/milieu therapy is offered.   Subjective:  Chart reviewed, case discussed in multidisciplinary meeting, patient seen during rounds.   She reports that police arrived at her home, placed her in handcuffs, and transported her to the hospital without providing an explanation. She states she has been living in her current residence for approximately one month after relocating from a shop in [place] and asserts that the residence is her own home, not a group home.  She denies any prior diagnosis of schizophrenia or bipolar disorder and states she has not had psychiatric hospitalizations except for one approximately two years ago. She denies ever hearing voices or seeing things that are not present and denies thoughts of self-harm or harm to others.  Despite this, the patient describes herself as a voice talker and reports ongoing communication for many years with a person named Rutha Jama Molt, whom she does not see. She denies hearing  the voice of God, receiving special messages from the television or radio, having special powers, or being able to read minds. She denies feeling that the government is watching her, though she references government watch devices. She reports increased sleepiness since starting medication but denies other side effects. She states she is eating well.  Per nursing report, the patient continues to express fixed beliefs that she owns the entire group home and demonstrates behavior consistent with responding to internal stimuli.  [name], DSS caseworker, [phone]  was granted ex-parte until guardianship is granted     Able to understand medical problem-patient remains paranoid and delusional and has no understanding of the events that led up to her admission including her housing situation which she continues to claim as her own house  Able to understand proposed treatment-patient has no understanding of the treatment options and medications that are proposed to her continues to claim that she does not require any psychotropics   Able to understand alternative to proposed treatment (if any)-no   Able to understand option of refusing proposed treatment (including withholding or withdrawing proposed treatment)-no   Able to appreciate reasonably foreseeable consequences of accepting proposed treatment-no  After thorough assessment, it is our clinical opinion-  Capacity is not competency. Competency is determined by legal system and judge. Capacity can vary from time to time depending on the mental status of the patient.  Past Psychiatric History: see h&P Family History:  Family History  Problem Relation Age of Onset   Diabetes Mother    CAD Mother    Lung cancer Father    Social History:  Social History  Substance and Sexual Activity  Alcohol Use Yes   Comment: beer occasionally     Social History   Substance and Sexual Activity  Drug Use No    Social History    Socioeconomic History   Marital status: Widowed    Spouse name: Not on file   Number of children: Not on file   Years of education: Not on file   Highest education level: Not on file  Occupational History   Not on file  Tobacco Use   Smoking status: Every Day    Current packs/day: 0.50    Average packs/day: 0.5 packs/day for 40.0 years (20.0 ttl pk-yrs)    Types: Cigarettes   Smokeless tobacco: Never  Vaping Use   Vaping status: Never Used  Substance and Sexual Activity   Alcohol use: Yes    Comment: beer occasionally   Drug use: No   Sexual activity: Not Currently    Birth control/protection: Post-menopausal  Other Topics Concern   Not on file  Social History Narrative   Not on file   Social Drivers of Health   Tobacco Use: High Risk (11/02/2024)   Patient History    Smoking Tobacco Use: Every Day    Smokeless Tobacco Use: Never    Passive Exposure: Not on file  Financial Resource Strain: Not on file  Food Insecurity: No Food Insecurity (10/17/2024)   Epic    Worried About Programme Researcher, Broadcasting/film/video in the Last Year: Never true    Ran Out of Food in the Last Year: Never true  Transportation Needs: No Transportation Needs (10/17/2024)   Epic    Lack of Transportation (Medical): No    Lack of Transportation (Non-Medical): No  Recent Concern: Transportation Needs - Unmet Transportation Needs (10/14/2024)   Epic    Lack of Transportation (Medical): Yes    Lack of Transportation (Non-Medical): Yes  Physical Activity: Not on file  Stress: Not on file  Social Connections: Unknown (10/17/2024)   Social Connection and Isolation Panel    Frequency of Communication with Friends and Family: Patient unable to answer    Frequency of Social Gatherings with Friends and Family: Patient unable to answer    Attends Religious Services: Patient unable to answer    Active Member of Clubs or Organizations: Patient declined    Attends Banker Meetings: Patient unable to  answer    Marital Status: Widowed  Depression (PHQ2-9): Not on file  Alcohol Screen: Low Risk (10/17/2024)   Alcohol Screen    Last Alcohol Screening Score (AUDIT): 0  Housing: Low Risk (10/17/2024)   Epic    Unable to Pay for Housing in the Last Year: No    Number of Times Moved in the Last Year: 0    Homeless in the Last Year: No  Utilities: Not At Risk (10/17/2024)   Epic    Threatened with loss of utilities: No  Health Literacy: Not on file   Past Medical History:  Past Medical History:  Diagnosis Date   Eye globe prosthesis    GERD (gastroesophageal reflux disease)    History of blood transfusion 1973   related to abscess burst in my stomach   Hyperlipemia    Hyperlipidemia    Hypertension    Osteoarthritis of back    Lowerback    SVD (spontaneous vaginal delivery)    x 1, baby died at 2 wks of age   Type II diabetes mellitus (HCC)     Past Surgical History:  Procedure Laterality Date   APPENDECTOMY     COLONOSCOPY     DILATATION & CURETTAGE/HYSTEROSCOPY WITH MYOSURE N/A 02/25/2015   Procedure: DILATATION & CURETTAGE/HYSTEROSCOPY WITH MYOSURE;  Surgeon: Truman Corona, MD;  Location: [facility];  Service: Gynecology;  Laterality: N/A;   DILATION AND CURETTAGE OF UTERUS     ENUCLEATION Right 03/16/2017   ENUCLEATION Right 03/16/2017   Procedure: ENUCLEATION RIGHT EYE;  Surgeon: Loyd Kathryne Palm, MD;  Location: [facility] OR;  Service: Ophthalmology;  Laterality: Right;   EYE SURGERY     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6, 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   EYE SURGERY Right ~ 1974   S/P initial eye injury; scissors stuck in my eye   LAPAROSCOPIC CHOLECYSTECTOMY     SHOULDER ARTHROSCOPY WITH ROTATOR CUFF REPAIR Left    wire stitches per patient   SHOULDER ARTHROSCOPY WITH ROTATOR CUFF REPAIR Right     Current Medications: Current Facility-Administered Medications  Medication Dose Route Frequency Provider Last Rate Last Admin   acetaminophen  (TYLENOL ) tablet 650 mg  650 mg Oral Q6H PRN Tex Drilling, NP   650 mg at 11/28/24 2117   alum & mag hydroxide-simeth (MAALOX/MYLANTA) 200-200-20 MG/5ML suspension 30 mL  30 mL Oral Q4H PRN Tex Drilling, NP       cloNIDine  (CATAPRES ) tablet 0.1 mg  0.1 mg Oral BID PRN Tex Drilling, NP       divalproex  (DEPAKOTE ) DR tablet 1,500 mg  1,500 mg Oral QHS Jadapalle, Sree, MD   1,500 mg at 11/29/24 2143   divalproex  (DEPAKOTE ) DR tablet 500 mg  500 mg Oral q AM Jadapalle, Sree, MD   500 mg at 11/30/24 9045   losartan  (COZAAR ) tablet 25 mg  25 mg Oral Daily Nkwenti, Doris, NP   25 mg at 11/30/24 9045   magnesium  hydroxide (MILK OF MAGNESIA) suspension 30 mL  30 mL Oral Daily PRN Tex Drilling, NP       menthol  (CEPACOL) lozenge 3 mg  1 lozenge Oral PRN Bobbitt, Shalon E, NP   3 mg at 11/28/24 2119   methimazole  (TAPAZOLE ) tablet 10 mg  10 mg Oral Daily Nkwenti, Doris, NP   10 mg at 11/30/24 9045   OLANZapine  (ZYPREXA ) injection 5 mg  5 mg Intramuscular TID PRN Tex Drilling, NP       OLANZapine  (ZYPREXA ) injection 5 mg  5 mg Intramuscular TID PRN Tex Drilling, NP       OLANZapine  (ZYPREXA ) tablet 20 mg  20 mg Oral QHS Jadapalle, Sree, MD   20 mg at 11/29/24 2143   OLANZapine  zydis (ZYPREXA ) disintegrating tablet 5 mg  5 mg Oral TID PRN Tex Drilling, NP       OLANZapine  zydis (ZYPREXA ) disintegrating tablet 5 mg  5 mg Oral TID PRN Tex Drilling, NP       pantoprazole  (PROTONIX ) EC tablet 40 mg  40 mg Oral Daily Nkwenti, Doris, NP   40 mg at 11/30/24 0954   traZODone  (DESYREL ) tablet 50 mg  50 mg Oral QHS PRN Tex Drilling, NP   50 mg at 11/29/24 2143    Lab Results:  No results found for this or any previous visit (from the past 48 hours).       Blood Alcohol level:  Lab Results  Component Value Date   [facility] <15 10/14/2024   ETH <10 07/02/2022    Metabolic Disorder Labs: Lab Results  Component Value Date   HGBA1C 5.5 10/14/2024   MPG 111.15 10/14/2024  MPG 137 01/06/2022   Lab Results  Component Value Date   PROLACTIN 5.8  10/14/2024   Lab Results  Component Value Date   CHOL 167 10/14/2024   TRIG 68 10/14/2024   HDL 60 10/14/2024   CHOLHDL 2.8 10/14/2024   VLDL 14 10/14/2024   LDLCALC 93 10/14/2024   LDLCALC 73 03/13/2024    Physical Findings: AIMS:  , ,  ,  ,    CIWA:    COWS:      Psychiatric Specialty Exam:  Presentation  General Appearance:  Appropriate for Environment  Eye Contact: Fleeting  Speech: Normal Rate  Speech Volume: Decreased    Mood and Affect  Mood:fine  Affect: Flat   Thought Process  Thought Processes: impoverished  Orientation:Partial  Thought Content: Chronic delusions Hallucinations: Denies  Ideas of Reference:Delusions; Paranoia  Suicidal Thoughts: Denies  Homicidal Thoughts: Denies   Sensorium  Memory: Immediate Fair; Recent Fair  Judgment: Impaired  Insight: Shallow   Executive Functions  Concentration: Fair  Attention Span: Fair  Recall: Poor  Fund of Knowledge: Fair  Language: Fair   Psychomotor Activity  Psychomotor Activity: No data recorded  Musculoskeletal: Strength & Muscle Tone: within normal limits Gait & Station: normal Assets  Assets: Manufacturing Systems Engineer; Desire for Improvement; Social Support    Physical Exam: Physical Exam Vitals and nursing note reviewed.    ROS Blood pressure 121/63, pulse 81, temperature 97.7 F (36.5 C), resp. rate 16, height 5' 8 (1.727 m), weight 48.3 kg, SpO2 94%. Body mass index is 16.19 kg/m.  Diagnosis: Principal Problem:   Schizophrenia, paranoid (HCC) Major neurocognitive disorder-slums total score of 13/30   Treatment Plan Summary: APS referral has been made as patient lacks capacity to make medical decisions.  Recommending legal guardianship and further placement at appropriate level of care  Safety and Monitoring:             -- Involuntary admission to inpatient psychiatric unit for safety, stabilization and treatment             -- Daily  contact with patient to assess and evaluate symptoms and progress in treatment             -- Patient's case to be discussed in multi-disciplinary team meeting             -- Observation Level: q15 minute checks             -- Vital signs:  q12 hours             -- Precautions: suicide, elopement, and assault   2. Psychiatric Diagnoses and Treatment:  Check Depakote  levels 11/20/2024-60           Depakote  changed to 500 mg every morning and 1500 mg nightly-in next 5 days 11/13/2024 Depakote  level-44 Zyprexa  20 mg nightly BMP-within normal limits Occupational Therapy evaluation for assessing appropriate level of care -- The risks/benefits/side-effects/alternatives to this medication were discussed in detail with the patient and time was given for questions. The patient consents to medication trial.                -- Metabolic profile and EKG monitoring obtained while on an atypical antipsychotic (BMI: Lipid Panel: HbgA1c: QTc:)              -- Encouraged patient to participate in unit milieu and in scheduled group therapies   Occupational Therapy recommendations If plan is discharge home, recommend the following:   Direct supervision/assist for medications  management;Direct supervision/assist for financial management;Assist for transportation;Assistance with cooking/housework    Ms Rivere was seen for OT treatment on this date. Upon arrival to room pt in dayroom, agreeable to tx. The SLUMS is a 30-point screening questionnaire that tests orientation, memory, attention, problem solving, and executive function. Pt scored a 13/30 indicating Dementia. Of note, it is not within occupational therapy scope of practice to diagnose cognitive impairments, this screen indicates need for further testing. Pt with noted impairments in memory and problem solving limiting ability to participate functionally in medication management, bill management, and safety cooking. Will continue to follow POC. Continue to  recommend SUPERVISION for safety with iADLs.    [facility] Mental Status Examination Orientation: 2/3  Calculations: 0/3 Naming animals: 2/3 Patient named 14 animals (0 points is 0-4 animals; 1 is 5-9 animals; 2 is 10-14 animals; 3 is 15+ animals) Recall: 3/5  Attention: 1/2 Clock drawing: 1/4 Visual Processing: 2/2 Paragraph Memory: 2/8  Total: 13/30;  Given that patient has completed high school, this score falls in the dementia range.   4. Discharge Planning:   -- Social work and case management to assist with discharge planning and identification of hospital follow-up needs prior to discharge  -- Estimated LOS: 3-4 days  Desmond Chimera, MD 11/30/2024, 5:54 PM

## 2024-11-30 NOTE — Group Note (Signed)
 Date:  11/30/2024 Time:  4:11 PM  Group Topic/Focus:  Fresh air Therapy with Music and Conversation    Participation Level:  Active  Participation Quality:  Appropriate  Affect:  Appropriate  Cognitive:  Appropriate  Insight: Appropriate  Engagement in Group:  Engaged  Modes of Intervention:  Socialization  Additional Comments:  none  Shari [name] Bias 11/30/2024, 4:11 PM

## 2024-11-30 NOTE — Group Note (Signed)
 Date:  11/30/2024 Time:  10:53 AM  Group Topic/Focus:  Wellness Toolbox:   The focus of this group is to discuss various aspects of wellness, balancing those aspects and exploring ways to increase the ability to experience wellness.  Patients will create a wellness toolbox for use upon discharge.  I had the opportunity to go over the holiday and New Year activity sheets with each patient individually. The activity sheets provided patients with an opportunity to express personal memories, reflect on meaningful life experiences, and share favorite traditions, movies, and meals. Patients were also encouraged to discuss gentle New Year plans and goals, focusing on realistic and positive intentions for the upcoming year.  The activities supported cognitive engagement through word searches, memory recall, simple problem-solving, and reflection exercises. Patients were able to participate at their own pace, and assistance was provided as needed. The activity sheets also included a brief, gentle movement component, allowing patients to engage in light physical activity appropriate to their abilities.  Overall, the activity promoted emotional expression, social interaction, and mental stimulation in a calm and supportive manner. Patients appeared engaged and receptive, and the activity provided a meaningful opportunity for conversation, self-expression, and encouragement of overall well-being.  Participation Level:  Active  Participation Quality:  Appropriate  Affect:  Appropriate  Cognitive:  Appropriate  Insight: Appropriate  Engagement in Group:  Engaged  Modes of Intervention:  Activity and Discussion  Additional Comments:    Joyanne Eddinger L Ragen Laver 11/30/2024, 10:53 AM

## 2024-11-30 NOTE — Plan of Care (Signed)
  Problem: Education: Goal: Emotional status will improve Outcome: Progressing Goal: Mental status will improve Outcome: Progressing   Problem: Activity: Goal: Interest or engagement in activities will improve Outcome: Progressing   Problem: Coping: Goal: Ability to verbalize frustrations and anger appropriately will improve Outcome: Progressing   Problem: Health Behavior/Discharge Planning: Goal: Identification of resources available to assist in meeting health care needs will improve Outcome: Progressing

## 2024-11-30 NOTE — Group Note (Signed)
 LCSW Group Therapy Note   Group Date: 11/30/2024 Start Time: 1400 End Time: 1430   Type of Therapy and Topic:  Group Therapy: Managing Intrusive Thoughts  Participation Level:  Active  Description of Group: The purpose of this group is to assist patients in learning how to regulate negative intrusive thoughts.  Intrusive thoughts are unwanted thoughts or vivid images that suddenly enter your mind.  They may be disturbing to the person experiencing them and may trigger feelings like anxiety, shame, or disgust.  Emphasis will be placed on coping with negative intrusive thoughts in situations and using positive strategies to combat negative intrusive thoughts.  Therapeutic Goals:  1. Patient will identify the difference of a inconsequential thought vs a urgent thought. 2. Patient will discuss when they need to seek assist with negative intrusive thoughts    Summary of Patient Progress:   The facilitator and patient discussed the difference of a  negative intrusive thought and when to use strategies to combat negative thoughts.   Therapeutic Modalities:  Cognitive Behavioral Therapy Dialectical Behavior Therapy   Haunani Dickard W Ellora Varnum, LCSWA 11/30/2024  2:51 PM

## 2024-11-30 NOTE — Group Note (Signed)
 Date:  11/30/2024 Time:  8:42 PM  Group Topic/Focus:  Making Healthy Choices:   The focus of this group is to help patients identify negative/unhealthy choices they were using prior to admission and identify positive/healthier coping strategies to replace them upon discharge.    Participation Level:  Active  Participation Quality:  Appropriate  Affect:  Appropriate  Cognitive:  Appropriate  Insight: Good and Limited  Engagement in Group:  Engaged  Modes of Intervention:  Discussion  Additional Comments:    Romero Earnie Hope 11/30/2024, 8:42 PM

## 2024-12-01 DIAGNOSIS — F0390 Unspecified dementia without behavioral disturbance: Secondary | ICD-10-CM | POA: Diagnosis not present

## 2024-12-01 DIAGNOSIS — F200 Paranoid schizophrenia: Secondary | ICD-10-CM | POA: Diagnosis not present

## 2024-12-01 NOTE — Group Note (Signed)
 Date:  12/01/2024 Time:  10:57 AM  Group Topic/Focus:  Coping With Mental Health Crisis:   The purpose of this group is to help patients identify strategies for coping with mental health crisis.  Group discusses possible causes of crisis and ways to manage them effectively.    Participation Level:  Active  Participation Quality:  Appropriate  Affect:  Appropriate  Cognitive:  Appropriate  Insight: Appropriate  Engagement in Group:  Engaged  Modes of Intervention:  Education  Additional Comments:  none  [name] 12/01/2024, 10:57 AM

## 2024-12-01 NOTE — Progress Notes (Signed)
 Patient pleasant during interaction and accepted all medications. Patient present in dayroom for meals and one group activity. Last BM reported today.   12/01/24 1100  Psych Admission Type (Psych Patients Only)  Admission Status Involuntary  Psychosocial Assessment  Patient Complaints None  Eye Contact Fair  Facial Expression Flat  Affect Flat  Speech Logical/coherent  Interaction Minimal  Motor Activity Slow  Appearance/Hygiene Unremarkable  Behavior Characteristics Cooperative  Mood Pleasant  Thought Process  Coherency WDL  Content WDL  Delusions None reported or observed  Perception WDL  Hallucination None reported or observed  Judgment Impaired  Confusion Mild  Danger to Self  Current suicidal ideation? Denies  Danger to Others  Danger to Others None reported or observed

## 2024-12-01 NOTE — Group Note (Signed)
 Date:  12/01/2024 Time:  3:55 PM  Group Topic/Focus:  Karaoke Group: The purpose of this group is for patients to interact with each other while singing their favorite songs.     Participation Level:  None  Shari [name] T Zulema 12/01/2024, 3:55 PM

## 2024-12-01 NOTE — Progress Notes (Signed)
" °   12/01/24 0016  Psych Admission Type (Psych Patients Only)  Admission Status Involuntary  Psychosocial Assessment  Patient Complaints None  Eye Contact Fair  Facial Expression Flat  Affect Flat  Speech Logical/coherent  Interaction Minimal  Motor Activity Slow  Appearance/Hygiene Unremarkable  Behavior Characteristics Cooperative  Mood Pleasant  Aggressive Behavior  Effect No apparent injury  Thought Process  Coherency WDL  Content WDL  Delusions None reported or observed  Perception WDL  Hallucination None reported or observed  Judgment Impaired  Confusion Mild  Danger to Self  Current suicidal ideation? Denies  Description of Suicide Plan No plan  Self-Injurious Behavior No self-injurious ideation or behavior indicators observed or expressed   Agreement Not to Harm Self Yes  Description of Agreement Verbal  Danger to Others  Danger to Others None reported or observed    "

## 2024-12-01 NOTE — Plan of Care (Signed)

## 2024-12-01 NOTE — Plan of Care (Signed)
" °  Problem: Activity: Goal: Interest or engagement in activities will improve Outcome: Progressing   Problem: Coping: Goal: Ability to verbalize frustrations and anger appropriately will improve Outcome: Not Progressing   Problem: Safety: Goal: Periods of time without injury will increase Outcome: Not Progressing   "

## 2024-12-01 NOTE — Group Note (Signed)
 Date:  12/01/2024 Time:  9:45 PM  Group Topic/Focus:  Wrap-Up Group:   The focus of this group is to help patients review their daily goal of treatment and discuss progress on daily workbooks.    Participation Level:  Active  Participation Quality:  Appropriate  Affect:  Appropriate  Cognitive:  Alert  Insight: Appropriate  Engagement in Group:  Engaged  Modes of Intervention:  Discussion  Additional Comments:    [name] [name] 12/01/2024, 9:45 PM

## 2024-12-01 NOTE — Progress Notes (Signed)
 [facility] MD Progress Note  12/01/2024 5:31 PM Shari [name]  MRN:  [id number] Patient is a 68 year old female with a history of Schizoaffective Disorder, Bipolar Type who presents via [employer] under IVC, initiated by CM, Falencio with re-entry program, to [facility] for assessment. Per IVC, [name] has been diagnosed with Schizophrenia and is refusing to take medication. [name] isn't sleeping. Residents report her screaming and yelling all night long.  [name] responding to internal stimuli.  [name] verbally aggressive.  The respondent has locked all of the residents inside of the transitional housing home.  They could not leave the home which prompted the IVC.  Residents did not have access to food, the restroom or anything as a result of being locked in.  [name] is currently under Adult protective services care. Patient is admitted to [facility] unit with Q15 min safety monitoring. Multidisciplinary team approach is offered. Medication management; group/milieu therapy is offered.   Subjective:  Chart reviewed, case discussed in multidisciplinary meeting, patient seen during rounds.  Per staff report patient continues to respond to internal stimuli.  Slept for 4.75 hours.  Patient on interview continues to express concerns about hearing Signe Jama Molt voice.  Patient reports that she hears her through spiritual/emotional contact.  Reports that she does not want to talk about her as she wants to focus on her own wellbeing.  Patient reports that she feels that liquid gets stuck in her throat and she has some thyroid  issue.  Reports that she has no issue swallowing any food item.  Solid food does not get stuck and she feels only the liquid diet gets stuck in her throat.  Since patient has had some throat discomfort we will give her throat lozenges and see response.  Patient is already on Cepacol.  Will asked nurse to give patient Cepacol.    Past Psychiatric History: see h&P Family  History:  Family History  Problem Relation Age of Onset   Diabetes Mother    CAD Mother    Lung cancer Father    Social History:  Social History   Substance and Sexual Activity  Alcohol Use Yes   Comment: beer occasionally     Social History   Substance and Sexual Activity  Drug Use No    Social History   Socioeconomic History   Marital status: Widowed    Spouse name: Not on file   Number of children: Not on file   Years of education: Not on file   Highest education level: Not on file  Occupational History   Not on file  Tobacco Use   Smoking status: Every Day    Current packs/day: 0.50    Average packs/day: 0.5 packs/day for 40.0 years (20.0 ttl pk-yrs)    Types: Cigarettes   Smokeless tobacco: Never  Vaping Use   Vaping status: Never Used  Substance and Sexual Activity   Alcohol use: Yes    Comment: beer occasionally   Drug use: No   Sexual activity: Not Currently    Birth control/protection: Post-menopausal  Other Topics Concern   Not on file  Social History Narrative   Not on file   Social Drivers of Health   Tobacco Use: High Risk (11/02/2024)   Patient History    Smoking Tobacco Use: Every Day    Smokeless Tobacco Use: Never    Passive Exposure: Not on file  Financial Resource Strain: Not on file  Food Insecurity: No Food Insecurity (10/17/2024)  Epic    Worried About Programme Researcher, Broadcasting/film/video in the Last Year: Never true    [employer] of Food in the Last Year: Never true  Transportation Needs: No Transportation Needs (10/17/2024)   Epic    Lack of Transportation (Medical): No    Lack of Transportation (Non-Medical): No  Recent Concern: Transportation Needs - Unmet Transportation Needs (10/14/2024)   Epic    Lack of Transportation (Medical): Yes    Lack of Transportation (Non-Medical): Yes  Physical Activity: Not on file  Stress: Not on file  Social Connections: Unknown (10/17/2024)   Social Connection and Isolation Panel    Frequency of  Communication with Friends and Family: Patient unable to answer    Frequency of Social Gatherings with Friends and Family: Patient unable to answer    Attends Religious Services: Patient unable to answer    Active Member of Clubs or Organizations: Patient declined    Attends Banker Meetings: Patient unable to answer    Marital Status: Widowed  Depression (PHQ2-9): Not on file  Alcohol Screen: Low Risk (10/17/2024)   Alcohol Screen    Last Alcohol Screening Score (AUDIT): 0  Housing: Low Risk (10/17/2024)   Epic    Unable to Pay for Housing in the Last Year: No    Number of Times Moved in the Last Year: 0    Homeless in the Last Year: No  Utilities: Not At Risk (10/17/2024)   Epic    Threatened with loss of utilities: No  Health Literacy: Not on file   Past Medical History:  Past Medical History:  Diagnosis Date   Eye globe prosthesis    GERD (gastroesophageal reflux disease)    History of blood transfusion 1973   related to abscess burst in my stomach   Hyperlipemia    Hyperlipidemia    Hypertension    Osteoarthritis of back    Lowerback    SVD (spontaneous vaginal delivery)    x 1, baby died at 2 wks of age   Type II diabetes mellitus (HCC)     Past Surgical History:  Procedure Laterality Date   APPENDECTOMY     COLONOSCOPY     DILATATION & CURETTAGE/HYSTEROSCOPY WITH MYOSURE N/A 02/25/2015   Procedure: DILATATION & CURETTAGE/HYSTEROSCOPY WITH MYOSURE;  Surgeon: Truman Corona, MD;  Location: [facility];  Service: Gynecology;  Laterality: N/A;   DILATION AND CURETTAGE OF UTERUS     ENUCLEATION Right 03/16/2017   ENUCLEATION Right 03/16/2017   Procedure: ENUCLEATION RIGHT EYE;  Surgeon: Loyd Kathryne Palm, MD;  Location: [facility] OR;  Service: Ophthalmology;  Laterality: Right;   EYE SURGERY     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6, 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   EYE SURGERY Right ~ 1974   S/P initial eye injury; scissors stuck in my eye   LAPAROSCOPIC CHOLECYSTECTOMY     SHOULDER  ARTHROSCOPY WITH ROTATOR CUFF REPAIR Left    wire stitches per patient   SHOULDER ARTHROSCOPY WITH ROTATOR CUFF REPAIR Right     Current Medications: Current Facility-Administered Medications  Medication Dose Route Frequency Provider Last Rate Last Admin   acetaminophen  (TYLENOL ) tablet 650 mg  650 mg Oral Q6H PRN Tex Drilling, NP   650 mg at 11/28/24 2117   alum & mag hydroxide-simeth (MAALOX/MYLANTA) 200-200-20 MG/5ML suspension 30 mL  30 mL Oral Q4H PRN Nkwenti, Drilling, NP       cloNIDine  (CATAPRES ) tablet 0.1 mg  0.1 mg Oral BID PRN Tex Drilling, NP  divalproex  (DEPAKOTE ) DR tablet 1,500 mg  1,500 mg Oral QHS Jadapalle, Sree, MD   1,500 mg at 11/30/24 2137   divalproex  (DEPAKOTE ) DR tablet 500 mg  500 mg Oral q AM Jadapalle, Sree, MD   500 mg at 12/01/24 0845   losartan  (COZAAR ) tablet 25 mg  25 mg Oral Daily Nkwenti, Doris, NP   25 mg at 12/01/24 9055   magnesium  hydroxide (MILK OF MAGNESIA) suspension 30 mL  30 mL Oral Daily PRN Tex Drilling, NP       menthol  (CEPACOL) lozenge 3 mg  1 lozenge Oral PRN Bobbitt, Shalon E, NP   3 mg at 11/30/24 2209   methimazole  (TAPAZOLE ) tablet 10 mg  10 mg Oral Daily Nkwenti, Doris, NP   10 mg at 12/01/24 0945   OLANZapine  (ZYPREXA ) injection 5 mg  5 mg Intramuscular TID PRN Tex Drilling, NP       OLANZapine  (ZYPREXA ) injection 5 mg  5 mg Intramuscular TID PRN Tex Drilling, NP       OLANZapine  (ZYPREXA ) tablet 20 mg  20 mg Oral QHS Jadapalle, Sree, MD   20 mg at 11/30/24 2136   OLANZapine  zydis (ZYPREXA ) disintegrating tablet 5 mg  5 mg Oral TID PRN Tex Drilling, NP       OLANZapine  zydis (ZYPREXA ) disintegrating tablet 5 mg  5 mg Oral TID PRN Tex Drilling, NP       pantoprazole  (PROTONIX ) EC tablet 40 mg  40 mg Oral Daily Nkwenti, Doris, NP   40 mg at 12/01/24 0944   traZODone  (DESYREL ) tablet 50 mg  50 mg Oral QHS PRN Tex Drilling, NP   50 mg at 11/30/24 2140    Lab Results:  No results found for this or any previous visit  (from the past 48 hours).       Blood Alcohol level:  Lab Results  Component Value Date   [facility] <15 10/14/2024   ETH <10 07/02/2022    Metabolic Disorder Labs: Lab Results  Component Value Date   HGBA1C 5.5 10/14/2024   MPG 111.15 10/14/2024   MPG 137 01/06/2022   Lab Results  Component Value Date   PROLACTIN 5.8 10/14/2024   Lab Results  Component Value Date   CHOL 167 10/14/2024   TRIG 68 10/14/2024   HDL 60 10/14/2024   CHOLHDL 2.8 10/14/2024   VLDL 14 10/14/2024   LDLCALC 93 10/14/2024   LDLCALC 73 03/13/2024    Physical Findings: AIMS:  , ,  ,  ,    CIWA:    COWS:      Psychiatric Specialty Exam:  Presentation  General Appearance:  Appropriate for Environment  Eye Contact: Fleeting  Speech: Normal Rate  Speech Volume: Decreased    Mood and Affect  Mood:fine  Affect: Flat   Thought Process  Thought Processes: impoverished  Orientation:Partial  Thought Content: Chronic delusions Hallucinations: Denies  Ideas of Reference:Delusions; Paranoia  Suicidal Thoughts: Denies  Homicidal Thoughts: Denies   Sensorium  Memory: Immediate Fair; Recent Fair  Judgment: Impaired  Insight: Shallow   Executive Functions  Concentration: Fair  Attention Span: Fair  Recall: Poor  Fund of Knowledge: Fair  Language: Fair   Psychomotor Activity  Psychomotor Activity: No data recorded  Musculoskeletal: Strength & Muscle Tone: within normal limits Gait & Station: normal Assets  Assets: Manufacturing Systems Engineer; Desire for Improvement; Social Support    Physical Exam: Physical Exam Vitals and nursing note reviewed.    ROS Blood pressure (!) 125/57, pulse 73,  temperature 98.7 F (37.1 C), resp. rate 18, height 5' 8 (1.727 m), weight 48.3 kg, SpO2 99%. Body mass index is 16.19 kg/m.  Diagnosis: Principal Problem:   Schizophrenia, paranoid (HCC) Major neurocognitive disorder-slums total score of 13/30   Treatment  Plan Summary: APS referral has been made as patient lacks capacity to make medical decisions.  Recommending legal guardianship and further placement at appropriate level of care  Safety and Monitoring:             -- Involuntary admission to inpatient psychiatric unit for safety, stabilization and treatment             -- Daily contact with patient to assess and evaluate symptoms and progress in treatment             -- Patient's case to be discussed in multi-disciplinary team meeting             -- Observation Level: q15 minute checks             -- Vital signs:  q12 hours             -- Precautions: suicide, elopement, and assault   2. Psychiatric Diagnoses and Treatment:  Check Depakote  levels 11/20/2024-60           Depakote  changed to 500 mg every morning and 1500 mg nightly-in next 5 days 11/13/2024 Depakote  level-44 Zyprexa  20 mg nightly BMP-within normal limits Occupational Therapy evaluation for assessing appropriate level of care -- The risks/benefits/side-effects/alternatives to this medication were discussed in detail with the patient and time was given for questions. The patient consents to medication trial.                -- Metabolic profile and EKG monitoring obtained while on an atypical antipsychotic (BMI: Lipid Panel: HbgA1c: QTc:)              -- Encouraged patient to participate in unit milieu and in scheduled group therapies   Occupational Therapy recommendations If plan is discharge home, recommend the following:   Direct supervision/assist for medications management;Direct supervision/assist for financial management;Assist for transportation;Assistance with cooking/housework    Ms Crepeau was seen for OT treatment on this date. Upon arrival to room pt in dayroom, agreeable to tx. The SLUMS is a 30-point screening questionnaire that tests orientation, memory, attention, problem solving, and executive function. Pt scored a 13/30 indicating Dementia. Of note, it is not  within occupational therapy scope of practice to diagnose cognitive impairments, this screen indicates need for further testing. Pt with noted impairments in memory and problem solving limiting ability to participate functionally in medication management, bill management, and safety cooking. Will continue to follow POC. Continue to recommend SUPERVISION for safety with iADLs.    [facility] Mental Status Examination Orientation: 2/3  Calculations: 0/3 Naming animals: 2/3 Patient named 14 animals (0 points is 0-4 animals; 1 is 5-9 animals; 2 is 10-14 animals; 3 is 15+ animals) Recall: 3/5  Attention: 1/2 Clock drawing: 1/4 Visual Processing: 2/2 Paragraph Memory: 2/8  Total: 13/30;  Given that patient has completed high school, this score falls in the dementia range.   4. Discharge Planning:   -- Social work and case management to assist with discharge planning and identification of hospital follow-up needs prior to discharge  -- Estimated LOS: 3-4 days  Desmond Chimera, MD 12/01/2024, 5:31 PM

## 2024-12-02 DIAGNOSIS — F200 Paranoid schizophrenia: Secondary | ICD-10-CM | POA: Diagnosis not present

## 2024-12-02 DIAGNOSIS — F0390 Unspecified dementia without behavioral disturbance: Secondary | ICD-10-CM | POA: Diagnosis not present

## 2024-12-02 NOTE — Plan of Care (Signed)

## 2024-12-02 NOTE — Progress Notes (Signed)
" °   12/02/24 2000  Psych Admission Type (Psych Patients Only)  Admission Status Involuntary  Psychosocial Assessment  Patient Complaints None  Eye Contact Fair  Facial Expression Flat  Affect Appropriate to circumstance  Speech Logical/coherent  Interaction Minimal  Motor Activity Slow  Appearance/Hygiene Unremarkable  Behavior Characteristics Appropriate to situation;Cooperative  Mood Pleasant  Thought Process  Coherency WDL  Content WDL  Delusions None reported or observed  Perception WDL  Hallucination None reported or observed  Judgment Impaired  Confusion Mild  Danger to Self  Current suicidal ideation? Denies  Self-Injurious Behavior No self-injurious ideation or behavior indicators observed or expressed   Agreement Not to Harm Self Yes  Description of Agreement verbal  Danger to Others  Danger to Others None reported or observed    "

## 2024-12-02 NOTE — [facility] Counselor (Signed)
 CSW received message from [name] that at this time there are no updates on placement for pt.   Lum Croft, MSW, [place] 12/02/2024 10:16 AM

## 2024-12-02 NOTE — Progress Notes (Signed)
 [facility] MD Progress Note  12/02/2024 12:37 PM Shari [name]  MRN:  [id number] Patient is a 68 year old female with a history of Schizoaffective Disorder, Bipolar Type who presents via [employer] under IVC, initiated by CM, Falencio with re-entry program, to [facility] for assessment. Per IVC, [name] has been diagnosed with Schizophrenia and is refusing to take medication. [name] isn't sleeping. Residents report her screaming and yelling all night long.  [name] responding to internal stimuli.  [name] verbally aggressive.  The respondent has locked all of the residents inside of the transitional housing home.  They could not leave the home which prompted the IVC.  Residents did not have access to food, the restroom or anything as a result of being locked in.  [name] is currently under Adult protective services care. Patient is admitted to [facility] unit with Q15 min safety monitoring. Multidisciplinary team approach is offered. Medication management; group/milieu therapy is offered.   Subjective:  Chart reviewed, case discussed in multidisciplinary meeting, patient seen during rounds.  Per staff report patient slept good.  [name] doing well.  No behavioral issues reported.  Patient on interview reports that she does not like to take any medication.  Patient continues to report of hearing voices from [place].  Unfortunately we do not know the patient baseline.  But patient is able to function even while hearing voices.  Reports that the voices are not bothering her.  Does not want to talk much about the voices.  Denies any visual hallucinations.  Denies any suicidal homicidal ideations.  Reports that her throat is better today.    Past Psychiatric History: see h&P Family History:  Family History  Problem Relation Age of Onset   Diabetes Mother    CAD Mother    Lung cancer Father    Social History:  Social History   Substance and Sexual Activity  Alcohol Use Yes    Comment: beer occasionally     Social History   Substance and Sexual Activity  Drug Use No    Social History   Socioeconomic History   Marital status: Widowed    Spouse name: Not on file   Number of children: Not on file   Years of education: Not on file   Highest education level: Not on file  Occupational History   Not on file  Tobacco Use   Smoking status: Every Day    Current packs/day: 0.50    Average packs/day: 0.5 packs/day for 40.0 years (20.0 ttl pk-yrs)    Types: Cigarettes   Smokeless tobacco: Never  Vaping Use   Vaping status: Never Used  Substance and Sexual Activity   Alcohol use: Yes    Comment: beer occasionally   Drug use: No   Sexual activity: Not Currently    Birth control/protection: Post-menopausal  Other Topics Concern   Not on file  Social History Narrative   Not on file   Social Drivers of Health   Tobacco Use: High Risk (11/02/2024)   Patient History    Smoking Tobacco Use: Every Day    Smokeless Tobacco Use: Never    Passive Exposure: Not on file  Financial Resource Strain: Not on file  Food Insecurity: No Food Insecurity (10/17/2024)   Epic    Worried About Programme Researcher, Broadcasting/film/video in the Last Year: Never true    Ran Out of Food in the Last Year: Never true  Transportation Needs: No Transportation Needs (10/17/2024)  Epic    Lack of Transportation (Medical): No    Lack of Transportation (Non-Medical): No  Recent Concern: Transportation Needs - Unmet Transportation Needs (10/14/2024)   Epic    Lack of Transportation (Medical): Yes    Lack of Transportation (Non-Medical): Yes  Physical Activity: Not on file  Stress: Not on file  Social Connections: Unknown (10/17/2024)   Social Connection and Isolation Panel    Frequency of Communication with Friends and Family: Patient unable to answer    Frequency of Social Gatherings with Friends and Family: Patient unable to answer    Attends Religious Services: Patient unable to answer    Active  Member of Clubs or Organizations: Patient declined    Attends Banker Meetings: Patient unable to answer    Marital Status: Widowed  Depression (PHQ2-9): Not on file  Alcohol Screen: Low Risk (10/17/2024)   Alcohol Screen    Last Alcohol Screening Score (AUDIT): 0  Housing: Low Risk (10/17/2024)   Epic    Unable to Pay for Housing in the Last Year: No    Number of Times Moved in the Last Year: 0    Homeless in the Last Year: No  Utilities: Not At Risk (10/17/2024)   Epic    Threatened with loss of utilities: No  Health Literacy: Not on file   Past Medical History:  Past Medical History:  Diagnosis Date   Eye globe prosthesis    GERD (gastroesophageal reflux disease)    History of blood transfusion 1973   related to abscess burst in my stomach   Hyperlipemia    Hyperlipidemia    Hypertension    Osteoarthritis of back    Lowerback    SVD (spontaneous vaginal delivery)    x 1, baby died at 2 wks of age   Type II diabetes mellitus (HCC)     Past Surgical History:  Procedure Laterality Date   APPENDECTOMY     COLONOSCOPY     DILATATION & CURETTAGE/HYSTEROSCOPY WITH MYOSURE N/A 02/25/2015   Procedure: DILATATION & CURETTAGE/HYSTEROSCOPY WITH MYOSURE;  Surgeon: Truman Corona, MD;  Location: [facility];  Service: Gynecology;  Laterality: N/A;   DILATION AND CURETTAGE OF UTERUS     ENUCLEATION Right 03/16/2017   ENUCLEATION Right 03/16/2017   Procedure: ENUCLEATION RIGHT EYE;  Surgeon: Loyd Kathryne Palm, MD;  Location: [facility] OR;  Service: Ophthalmology;  Laterality: Right;   EYE SURGERY     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6, 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   EYE SURGERY Right ~ 1974   S/P initial eye injury; scissors stuck in my eye   LAPAROSCOPIC CHOLECYSTECTOMY     SHOULDER ARTHROSCOPY WITH ROTATOR CUFF REPAIR Left    wire stitches per patient   SHOULDER ARTHROSCOPY WITH ROTATOR CUFF REPAIR Right     Current Medications: Current Facility-Administered Medications  Medication Dose  Route Frequency Provider Last Rate Last Admin   acetaminophen  (TYLENOL ) tablet 650 mg  650 mg Oral Q6H PRN Tex Drilling, NP   650 mg at 11/28/24 2117   alum & mag hydroxide-simeth (MAALOX/MYLANTA) 200-200-20 MG/5ML suspension 30 mL  30 mL Oral Q4H PRN Tex Drilling, NP       cloNIDine  (CATAPRES ) tablet 0.1 mg  0.1 mg Oral BID PRN Tex Drilling, NP       divalproex  (DEPAKOTE ) DR tablet 1,500 mg  1,500 mg Oral QHS Jadapalle, Sree, MD   1,500 mg at 12/01/24 2146   divalproex  (DEPAKOTE ) DR tablet 500 mg  500 mg  Oral q AM Jadapalle, Sree, MD   500 mg at 12/02/24 1024   losartan  (COZAAR ) tablet 25 mg  25 mg Oral Daily Nkwenti, Doris, NP   25 mg at 12/02/24 1025   magnesium  hydroxide (MILK OF MAGNESIA) suspension 30 mL  30 mL Oral Daily PRN Tex Drilling, NP       menthol  (CEPACOL) lozenge 3 mg  1 lozenge Oral PRN Bobbitt, Shalon E, NP   3 mg at 11/30/24 2209   methimazole  (TAPAZOLE ) tablet 10 mg  10 mg Oral Daily Tex Drilling, NP   10 mg at 12/02/24 1024   OLANZapine  (ZYPREXA ) injection 5 mg  5 mg Intramuscular TID PRN Tex Drilling, NP       OLANZapine  (ZYPREXA ) injection 5 mg  5 mg Intramuscular TID PRN Tex Drilling, NP       OLANZapine  (ZYPREXA ) tablet 20 mg  20 mg Oral QHS Jadapalle, Sree, MD   20 mg at 12/01/24 2146   OLANZapine  zydis (ZYPREXA ) disintegrating tablet 5 mg  5 mg Oral TID PRN Tex Drilling, NP       OLANZapine  zydis (ZYPREXA ) disintegrating tablet 5 mg  5 mg Oral TID PRN Tex Drilling, NP       pantoprazole  (PROTONIX ) EC tablet 40 mg  40 mg Oral Daily Nkwenti, Doris, NP   40 mg at 12/02/24 1024   traZODone  (DESYREL ) tablet 50 mg  50 mg Oral QHS PRN Tex Drilling, NP   50 mg at 11/30/24 2140    Lab Results:  No results found for this or any previous visit (from the past 48 hours).       Blood Alcohol level:  Lab Results  Component Value Date   [facility] <15 10/14/2024   ETH <10 07/02/2022    Metabolic Disorder Labs: Lab Results  Component Value Date   HGBA1C  5.5 10/14/2024   MPG 111.15 10/14/2024   MPG 137 01/06/2022   Lab Results  Component Value Date   PROLACTIN 5.8 10/14/2024   Lab Results  Component Value Date   CHOL 167 10/14/2024   TRIG 68 10/14/2024   HDL 60 10/14/2024   CHOLHDL 2.8 10/14/2024   VLDL 14 10/14/2024   LDLCALC 93 10/14/2024   LDLCALC 73 03/13/2024    Physical Findings: AIMS:  , ,  ,  ,    CIWA:    COWS:      Psychiatric Specialty Exam:  Presentation  General Appearance:  Appropriate for Environment  Eye Contact: Fleeting  Speech: Normal Rate  Speech Volume: Decreased    Mood and Affect  Mood:fine  Affect: Flat   Thought Process  Thought Processes: impoverished  Orientation:Partial  Thought Content: Chronic delusions Hallucinations: Denies  Ideas of Reference:Delusions; Paranoia  Suicidal Thoughts: Denies  Homicidal Thoughts: Denies   Sensorium  Memory: Immediate Fair; Recent Fair  Judgment: Impaired  Insight: Shallow   Executive Functions  Concentration: Fair  Attention Span: Fair  Recall: Poor  Fund of Knowledge: Fair  Language: Fair   Psychomotor Activity  Psychomotor Activity: No data recorded  Musculoskeletal: Strength & Muscle Tone: within normal limits Gait & Station: normal Assets  Assets: Manufacturing Systems Engineer; Desire for Improvement; Social Support    Physical Exam: Physical Exam Vitals and nursing note reviewed.    ROS Blood pressure 122/78, pulse 73, temperature (!) 97.5 F (36.4 C), resp. rate 16, height 5' 8 (1.727 m), weight 48.3 kg, SpO2 100%. Body mass index is 16.19 kg/m.  Diagnosis: Principal Problem:   Schizophrenia, paranoid (  HCC) Major neurocognitive disorder-slums total score of 13/30   Treatment Plan Summary: APS referral has been made as patient lacks capacity to make medical decisions.  Recommending legal guardianship and further placement at appropriate level of care  Safety and Monitoring:              -- Involuntary admission to inpatient psychiatric unit for safety, stabilization and treatment             -- Daily contact with patient to assess and evaluate symptoms and progress in treatment             -- Patient's case to be discussed in multi-disciplinary team meeting             -- Observation Level: q15 minute checks             -- Vital signs:  q12 hours             -- Precautions: suicide, elopement, and assault   2. Psychiatric Diagnoses and Treatment:  Check Depakote  levels 11/20/2024-60           Depakote  changed to 500 mg every morning and 1500 mg nightly-in next 5 days 11/13/2024 Depakote  level-44 Zyprexa  20 mg nightly BMP-within normal limits Occupational Therapy evaluation for assessing appropriate level of care -- The risks/benefits/side-effects/alternatives to this medication were discussed in detail with the patient and time was given for questions. The patient consents to medication trial.                -- Metabolic profile and EKG monitoring obtained while on an atypical antipsychotic (BMI: Lipid Panel: HbgA1c: QTc:)              -- Encouraged patient to participate in unit milieu and in scheduled group therapies   Occupational Therapy recommendations If plan is discharge home, recommend the following:   Direct supervision/assist for medications management;Direct supervision/assist for financial management;Assist for transportation;Assistance with cooking/housework    Ms Ace was seen for OT treatment on this date. Upon arrival to room pt in dayroom, agreeable to tx. The SLUMS is a 30-point screening questionnaire that tests orientation, memory, attention, problem solving, and executive function. Pt scored a 13/30 indicating Dementia. Of note, it is not within occupational therapy scope of practice to diagnose cognitive impairments, this screen indicates need for further testing. Pt with noted impairments in memory and problem solving limiting ability to participate  functionally in medication management, bill management, and safety cooking. Will continue to follow POC. Continue to recommend SUPERVISION for safety with iADLs.    [facility] Mental Status Examination Orientation: 2/3  Calculations: 0/3 Naming animals: 2/3 Patient named 14 animals (0 points is 0-4 animals; 1 is 5-9 animals; 2 is 10-14 animals; 3 is 15+ animals) Recall: 3/5  Attention: 1/2 Clock drawing: 1/4 Visual Processing: 2/2 Paragraph Memory: 2/8  Total: 13/30;  Given that patient has completed high school, this score falls in the dementia range.   4. Discharge Planning:   -- Social work and case management to assist with discharge planning and identification of hospital follow-up needs prior to discharge  -- Estimated LOS: 3-4 days  Desmond Chimera, MD 12/02/2024, 12:37 PM

## 2024-12-02 NOTE — Group Note (Signed)
 Date:  12/02/2024 Time:  10:42 AM  Group Topic/Focus:    For geriatric mental health patients, effective exercises combine gentle movement, mind-body practices, and social engagement, focusing on activities like walking in nature, yoga, Tai Chi, water  aerobics, and light strength training, which boost mood, reduce anxiety, improve sleep, and fight cognitive decline by increasing blood flow and endorphins, while also providing structure and purpose. Small amounts of daily activity (even 10 minutes) help, with a mix of aerobic, balance, and strength exercises recommended for overall well-being.    Participation Level:  Did Not Attend  Harlene LITTIE Gavel 12/02/2024, 10:42 AM

## 2024-12-02 NOTE — Group Note (Signed)
 Date:  12/02/2024 Time:  11:17 PM  Group Topic/Focus:  Wrap-Up Group:   The focus of this group is to help patients review their daily goal of treatment and discuss progress on daily workbooks.    Participation Level:  Active  Participation Quality:  Appropriate  Affect:  Appropriate  Cognitive:  Alert  Insight: Appropriate  Engagement in Group:  Engaged  Modes of Intervention:  Discussion  Additional Comments:    [name] [name] 12/02/2024, 11:17 PM

## 2024-12-02 NOTE — Progress Notes (Signed)
 Behavior:  Mild confusion. Pleasant and cooperative.   Psych assessment:  Denies SI/HI and AVH.  Interaction / Group attendance:  Present in the milieu.  Minimal interaction with peers and staff.  Attends most groups.  Medication/ PRNs: Compliant.  Pain: Denies  15 min checks in place for safety.

## 2024-12-02 NOTE — Plan of Care (Signed)
  Problem: Activity: Goal: Interest or engagement in activities will improve Outcome: Progressing   Problem: Coping: Goal: Ability to demonstrate self-control will improve Outcome: Progressing   Problem: Health Behavior/Discharge Planning: Goal: Compliance with treatment plan for underlying cause of condition will improve Outcome: Progressing   

## 2024-12-02 NOTE — Group Note (Signed)
 Recreation Therapy Group Note   Group Topic:Coping Skills  Group Date: 12/02/2024 Start Time: 1415 End Time: 1440 Facilitators: Celestia Jeoffrey BRAVO, LRT, CTRS Location: Courtyard  Group Description: Music. Patients are encouraged to name their favorite song(s) for LRT to play song through speaker for group to hear, while in the courtyard getting fresh air and sunlight. Patients educated on the definition of leisure and the importance of having different leisure interests outside of the hospital. Group discussed how leisure activities can often be used as pharmacologist and that listening to music and being outside are examples.    Goal Area(s) Addressed:  Patient will identify a current leisure interest.  Patient will practice making a positive decision. Patient will have the opportunity to try a new leisure activity.   Affect/Mood: Appropriate   Participation Level: Active and Engaged   Participation Quality: Independent   Behavior: Calm and Cooperative   Speech/Thought Process: Coherent   Insight: Good   Judgement: Good   Modes of Intervention: Guided Discussion, Music, Rapport Building, and Socialization   Patient Response to Interventions:  Attentive, Engaged, and Receptive   Education Outcome:  Acknowledges education   Clinical Observations/Individualized Feedback: Shari [name] was active in their participation of session activities and group discussion. [name]. Pt interacted well with LRT and peers duration of session.    Plan: Continue to engage patient in RT group sessions 2-3x/week.   Jeoffrey BRAVO Celestia, LRT, CTRS 12/02/2024 4:41 PM

## 2024-12-02 NOTE — Group Note (Signed)
 Physical/Occupational Therapy Group Note  Group Topic: Yoga  Group Date: 12/02/2024 Start Time: 1305 End Time: 1333 Facilitators: Tennessee Perra, Alm Hamilton, PT   Group Description: Group participated with series of yoga poses, designed to emphasize functional sitting balance, core stability, generalized flexibility and overall posture.  Incorporated deep breathing techniques with poses, working to promote relaxation, mindfulness and focus with targeted activities.  Discussed benefits of yoga in improving mood and self-esteem, reducing stress and anxiety, and promoting functional strength, balance and core stability for each participant.  Discussed ways to integrate into each participants daily routine.  Provided handout with written and pictorial descriptions of included yoga movements to be utilized as appropriate outside of group time.  Therapeutic Goal(s):  Demonstrate safe ability to participate with yoga poses during group activity. Identify one benefit of participation with yoga poses as part of each participants exercise/movement routine. Identify 1-2 individual poses that participant feels most beneficial to his/her needs and that he/she can easily replicate outside of group.  Individual Participation: Pt actively participated with the discussion and physical activity components of the session and was able with min cuing to modify poses as needed for patient comfort.  Participation Level: Active and Engaged   Participation Quality: Minimal Cues   Behavior: Alert and Appropriate   Speech/Thought Process: Coherent and Focused   Affect/Mood: Flat   Insight: Good   Judgement: Good   Modes of Intervention: Activity, Discussion, and Education  Patient Response to Interventions:  Attentive, Engaged, Interested , and Receptive   Plan: Continue to engage patient in PT/OT groups 1 - 2x/week.  CHARM Hamilton Bertin PT, DPT 12/02/2024, 1:42 PM

## 2024-12-02 NOTE — Progress Notes (Signed)
" °   12/02/24 0147  Psych Admission Type (Psych Patients Only)  Admission Status Involuntary  Psychosocial Assessment  Patient Complaints None  Eye Contact Fair  Facial Expression Flat  Affect Anxious  Speech Logical/coherent  Interaction Minimal  Motor Activity Slow  Appearance/Hygiene Unremarkable  Behavior Characteristics Cooperative;Calm  Mood Pleasant  Thought Process  Coherency WDL  Content WDL  Delusions None reported or observed  Perception WDL  Hallucination None reported or observed  Judgment Impaired  Confusion Mild  Danger to Self  Current suicidal ideation? Denies  Description of Suicide Plan No Plan  Self-Injurious Behavior No self-injurious ideation or behavior indicators observed or expressed   Agreement Not to Harm Self Yes  Description of Agreement Verbal  Danger to Others  Danger to Others None reported or observed    "

## 2024-12-02 NOTE — [facility] Counselor (Signed)
 CSW contacted APS caseworker [name] via text regarding updates on placement for pt.   CSW awaiting updates at this time.   Lum Croft, MSW, [place] 12/02/2024 10:15 AM

## 2024-12-03 DIAGNOSIS — F200 Paranoid schizophrenia: Secondary | ICD-10-CM | POA: Diagnosis not present

## 2024-12-03 DIAGNOSIS — F0390 Unspecified dementia without behavioral disturbance: Secondary | ICD-10-CM | POA: Diagnosis not present

## 2024-12-03 MED ORDER — PALIPERIDONE ER 3 MG PO TB24
3.0000 mg | ORAL_TABLET | Freq: Every day | ORAL | Status: DC
  Administered 2024-12-03 – 2024-12-20 (×18): 3 mg via ORAL
  Filled 2024-12-03 (×18): qty 1

## 2024-12-03 NOTE — Progress Notes (Signed)
(  Sleep Hours) - 7.50 (Any PRNs that were needed, meds refused, or side effects to meds)- None requested/observed (Any disturbances and when (visitation, over night)-None observed/reported (Concerns raised by the patient)- None observed/reported (SI/HI/AVH)- Denies

## 2024-12-03 NOTE — Group Note (Signed)
 Date:  12/03/2024 Time:  10:45 AM  Group Topic/Focus:   Effective coping skills for seniors involve a mix of mental, physical, and social activities, like mindfulness, light exercise (walking, yoga), creative outlets (art, music, writing), maintaining routines, and fostering social connections, which all reduce stress, combat loneliness, boost mood, and build resilience for life's changes. Deep breathing and journaling also offer immediate calm, while therapy can provide professional support.   Participation Level:  Active  Participation Quality:  Appropriate  Affect:  Appropriate  Cognitive:  Appropriate  Insight: Appropriate  Engagement in Group:  Engaged  Modes of Intervention:  Discussion  Additional Comments:  N/A  Harlene LITTIE Gavel 12/03/2024, 10:45 AM

## 2024-12-03 NOTE — Plan of Care (Signed)
°  Problem: Education: Goal: Mental status will improve Outcome: Progressing   Problem: Coping: Goal: Ability to demonstrate self-control will improve Outcome: Progressing   Problem: Health Behavior/Discharge Planning: Goal: Compliance with treatment plan for underlying cause of condition will improve Outcome: Progressing   Problem: Safety: Goal: Periods of time without injury will increase Outcome: Progressing   

## 2024-12-03 NOTE — Progress Notes (Signed)
 Behavior:  Mild confusion.  Peasant and cooperative.   Psych assessment: Denies SI/HI and AVH.   Interaction / Group attendance:  Present in the milieu.  Appropriate interaction with peers and staff.  Attends groups.   Medication/ PRNs: Compliant.   Pain: Denies.  15 min checks in place for safety.

## 2024-12-03 NOTE — Group Note (Signed)
 Date:  12/03/2024 Time:  8:35 PM  Group Topic/Focus:  Wrap-Up Group:   The focus of this group is to help patients review their daily goal of treatment and discuss progress on daily workbooks.    Participation Level:  Active  Participation Quality:  Appropriate  Affect:  Appropriate  Cognitive:  Appropriate  Insight: Appropriate  Engagement in Group:  Engaged  Modes of Intervention:  Discussion  Additional Comments:    [name] 12/03/2024, 8:35 PM

## 2024-12-03 NOTE — Group Note (Signed)
 Recreation Therapy Group Note   Group Topic:Health and Wellness  Group Date: 12/03/2024 Start Time: 1425 End Time: 1445 Facilitators: Celestia Jeoffrey BRAVO, LRT, CTRS Location: Craft Room  Group Description: Seated Exercise. LRT discussed the mental and physical benefits of exercise. LRT and group discussed how physical activity can be used as a coping skill. Pt's and LRT followed along to an exercise video on the TV screen that provided a visual representation and audio description of every exercise performed. Pt's encouraged to listen to their bodies and stop at any time if they experience feelings of discomfort or pain. Pts were encouraged to drink water  and stay hydrated.   Goal Area(s) Addressed:  Patient will learn benefits of physical activity. Patient will identify exercise as a coping skill.  Patient will follow multistep directions. Patient will try a new leisure interest.    Affect/Mood: N/A   Participation Level: Did not attend    Clinical Observations/Individualized Feedback: Patient did not attend.  Plan: Continue to engage patient in RT group sessions 2-3x/week.   [address], LRT, CTRS 12/03/2024 4:02 PM

## 2024-12-03 NOTE — Progress Notes (Signed)
 [facility] MD Progress Note  12/03/2024 12:34 PM Shari [name]  MRN:  [id number] Patient is a 68 year old female with a history of Schizoaffective Disorder, Bipolar Type who presents via GPD under IVC, initiated by CM, Falencio with re-entry program, to [facility] for assessment. Per IVC, [name] has been diagnosed with Schizophrenia and is refusing to take medication. [name] isn't sleeping. Residents report her screaming and yelling all night long.  [name] responding to internal stimuli.  [name] verbally aggressive.  The respondent has locked all of the residents inside of the transitional housing home.  They could not leave the home which prompted the IVC.  Residents did not have access to food, the restroom or anything as a result of being locked in.  [name] is currently under Adult protective services care. Patient is admitted to [facility] unit with Q15 min safety monitoring. Multidisciplinary team approach is offered. Medication management; group/milieu therapy is offered.   Subjective:  Chart reviewed, case discussed in multidisciplinary meeting, patient seen during rounds.   Per nursing report patient is doing fine.  No concerns reported no agitation or aggression noted.  Patient was seen in her room still talking about Rutha Molt.  Patient does not want to give any details about that.  Patient continues to report of hearing voices.  The patient continues to believe that it was her house that other people were staying in and was not a group home.  Patient was adamant that she wants to go back to that house only.  APS in the process of finding a placement.  Patient slept for 7.5 hours.  Patient reports that she is taking the medication while she is in the hospital but will stop the medication when she go home.  Given significant delusion we will start patient on additional Invega  and taper it up to address ongoing hallucinations and delusions.  And potentially switching to  LAI if possible.      Past Psychiatric History: see h&P Family History:  Family History  Problem Relation Age of Onset   Diabetes Mother    CAD Mother    Lung cancer Father    Social History:  Social History   Substance and Sexual Activity  Alcohol Use Yes   Comment: beer occasionally     Social History   Substance and Sexual Activity  Drug Use No    Social History   Socioeconomic History   Marital status: Widowed    Spouse name: Not on file   Number of children: Not on file   Years of education: Not on file   Highest education level: Not on file  Occupational History   Not on file  Tobacco Use   Smoking status: Every Day    Current packs/day: 0.50    Average packs/day: 0.5 packs/day for 40.0 years (20.0 ttl pk-yrs)    Types: Cigarettes   Smokeless tobacco: Never  Vaping Use   Vaping status: Never Used  Substance and Sexual Activity   Alcohol use: Yes    Comment: beer occasionally   Drug use: No   Sexual activity: Not Currently    Birth control/protection: Post-menopausal  Other Topics Concern   Not on file  Social History Narrative   Not on file   Social Drivers of Health   Tobacco Use: High Risk (11/02/2024)   Patient History    Smoking Tobacco Use: Every Day    Smokeless Tobacco Use: Never    Passive Exposure: Not  on file  Financial Resource Strain: Not on file  Food Insecurity: No Food Insecurity (10/17/2024)   Epic    Worried About Programme Researcher, Broadcasting/film/video in the Last Year: Never true    Ran Out of Food in the Last Year: Never true  Transportation Needs: No Transportation Needs (10/17/2024)   Epic    Lack of Transportation (Medical): No    Lack of Transportation (Non-Medical): No  Recent Concern: Transportation Needs - Unmet Transportation Needs (10/14/2024)   Epic    Lack of Transportation (Medical): Yes    Lack of Transportation (Non-Medical): Yes  Physical Activity: Not on file  Stress: Not on file  Social Connections: Unknown  (10/17/2024)   Social Connection and Isolation Panel    Frequency of Communication with Friends and Family: Patient unable to answer    Frequency of Social Gatherings with Friends and Family: Patient unable to answer    Attends Religious Services: Patient unable to answer    Active Member of Clubs or Organizations: Patient declined    Attends Banker Meetings: Patient unable to answer    Marital Status: Widowed  Depression (PHQ2-9): Not on file  Alcohol Screen: Low Risk (10/17/2024)   Alcohol Screen    Last Alcohol Screening Score (AUDIT): 0  Housing: Low Risk (10/17/2024)   Epic    Unable to Pay for Housing in the Last Year: No    Number of Times Moved in the Last Year: 0    Homeless in the Last Year: No  Utilities: Not At Risk (10/17/2024)   Epic    Threatened with loss of utilities: No  Health Literacy: Not on file   Past Medical History:  Past Medical History:  Diagnosis Date   Eye globe prosthesis    GERD (gastroesophageal reflux disease)    History of blood transfusion 1973   related to abscess burst in my stomach   Hyperlipemia    Hyperlipidemia    Hypertension    Osteoarthritis of back    Lowerback    SVD (spontaneous vaginal delivery)    x 1, baby died at 2 wks of age   Type II diabetes mellitus (HCC)     Past Surgical History:  Procedure Laterality Date   APPENDECTOMY     COLONOSCOPY     DILATATION & CURETTAGE/HYSTEROSCOPY WITH MYOSURE N/A 02/25/2015   Procedure: DILATATION & CURETTAGE/HYSTEROSCOPY WITH MYOSURE;  Surgeon: Truman Corona, MD;  Location: [facility];  Service: Gynecology;  Laterality: N/A;   DILATION AND CURETTAGE OF UTERUS     ENUCLEATION Right 03/16/2017   ENUCLEATION Right 03/16/2017   Procedure: ENUCLEATION RIGHT EYE;  Surgeon: Loyd Kathryne Palm, MD;  Location: [facility] OR;  Service: Ophthalmology;  Laterality: Right;   EYE SURGERY     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6, 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   EYE SURGERY Right ~ 1974   S/P initial eye injury;  scissors stuck in my eye   LAPAROSCOPIC CHOLECYSTECTOMY     SHOULDER ARTHROSCOPY WITH ROTATOR CUFF REPAIR Left    wire stitches per patient   SHOULDER ARTHROSCOPY WITH ROTATOR CUFF REPAIR Right     Current Medications: Current Facility-Administered Medications  Medication Dose Route Frequency Provider Last Rate Last Admin   acetaminophen  (TYLENOL ) tablet 650 mg  650 mg Oral Q6H PRN Tex Drilling, NP   650 mg at 12/02/24 2201   alum & mag hydroxide-simeth (MAALOX/MYLANTA) 200-200-20 MG/5ML suspension 30 mL  30 mL Oral Q4H PRN Tex Drilling, NP  cloNIDine  (CATAPRES ) tablet 0.1 mg  0.1 mg Oral BID PRN Tex Drilling, NP       divalproex  (DEPAKOTE ) DR tablet 1,500 mg  1,500 mg Oral QHS Jadapalle, Sree, MD   1,500 mg at 12/02/24 2200   divalproex  (DEPAKOTE ) DR tablet 500 mg  500 mg Oral q AM Jadapalle, Sree, MD   500 mg at 12/03/24 9048   losartan  (COZAAR ) tablet 25 mg  25 mg Oral Daily Nkwenti, Doris, NP   25 mg at 12/03/24 0951   magnesium  hydroxide (MILK OF MAGNESIA) suspension 30 mL  30 mL Oral Daily PRN Tex Drilling, NP       menthol  (CEPACOL) lozenge 3 mg  1 lozenge Oral PRN Bobbitt, Shalon E, NP   3 mg at 12/02/24 2203   methimazole  (TAPAZOLE ) tablet 10 mg  10 mg Oral Daily Nkwenti, Doris, NP   10 mg at 12/03/24 9048   OLANZapine  (ZYPREXA ) injection 5 mg  5 mg Intramuscular TID PRN Tex Drilling, NP       OLANZapine  (ZYPREXA ) injection 5 mg  5 mg Intramuscular TID PRN Tex Drilling, NP       OLANZapine  (ZYPREXA ) tablet 20 mg  20 mg Oral QHS Jadapalle, Sree, MD   20 mg at 12/02/24 2201   OLANZapine  zydis (ZYPREXA ) disintegrating tablet 5 mg  5 mg Oral TID PRN Tex Drilling, NP       OLANZapine  zydis (ZYPREXA ) disintegrating tablet 5 mg  5 mg Oral TID PRN Tex Drilling, NP       paliperidone  (INVEGA ) 24 hr tablet 3 mg  3 mg Oral Daily Maddoxx Burkitt, MD       pantoprazole  (PROTONIX ) EC tablet 40 mg  40 mg Oral Daily Nkwenti, Doris, NP   40 mg at 12/03/24 9048    traZODone  (DESYREL ) tablet 50 mg  50 mg Oral QHS PRN Tex Drilling, NP   50 mg at 12/02/24 2201    Lab Results:  No results found for this or any previous visit (from the past 48 hours).       Blood Alcohol level:  Lab Results  Component Value Date   [facility] <15 10/14/2024   ETH <10 07/02/2022    Metabolic Disorder Labs: Lab Results  Component Value Date   HGBA1C 5.5 10/14/2024   MPG 111.15 10/14/2024   MPG 137 01/06/2022   Lab Results  Component Value Date   PROLACTIN 5.8 10/14/2024   Lab Results  Component Value Date   CHOL 167 10/14/2024   TRIG 68 10/14/2024   HDL 60 10/14/2024   CHOLHDL 2.8 10/14/2024   VLDL 14 10/14/2024   LDLCALC 93 10/14/2024   LDLCALC 73 03/13/2024    Physical Findings: AIMS:  , ,  ,  ,    CIWA:    COWS:      Psychiatric Specialty Exam:  Presentation  General Appearance:  Appropriate for Environment  Eye Contact: Fleeting  Speech: Normal Rate  Speech Volume: Decreased    Mood and Affect  Mood:fine  Affect: Flat   Thought Process  Thought Processes: impoverished  Orientation:Partial  Thought Content: Chronic delusions Hallucinations: Denies  Ideas of Reference:Delusions; Paranoia  Suicidal Thoughts: Denies  Homicidal Thoughts: Denies   Sensorium  Memory: Immediate Fair; Recent Fair  Judgment: Impaired  Insight: Shallow   Executive Functions  Concentration: Fair  Attention Span: Fair  Recall: Poor  Fund of Knowledge: Fair  Language: Fair   Psychomotor Activity  Psychomotor Activity: No data recorded  Musculoskeletal: Strength &  Muscle Tone: within normal limits Gait & Station: normal Assets  Assets: Manufacturing Systems Engineer; Desire for Improvement; Social Support    Physical Exam: Physical Exam Vitals and nursing note reviewed.    ROS Blood pressure (!) 125/59, pulse 70, temperature 97.9 F (36.6 C), resp. rate 16, height 5' 8 (1.727 m), weight 48.3 kg, SpO2 98%. Body  mass index is 16.19 kg/m.  Diagnosis: Principal Problem:   Schizophrenia, paranoid (HCC) Major neurocognitive disorder-slums total score of 13/30   Treatment Plan Summary: APS referral has been made as patient lacks capacity to make medical decisions.  Recommending legal guardianship and further placement at appropriate level of care  Safety and Monitoring:             -- Involuntary admission to inpatient psychiatric unit for safety, stabilization and treatment             -- Daily contact with patient to assess and evaluate symptoms and progress in treatment             -- Patient's case to be discussed in multi-disciplinary team meeting             -- Observation Level: q15 minute checks             -- Vital signs:  q12 hours             -- Precautions: suicide, elopement, and assault   2. Psychiatric Diagnoses and Treatment:  Check Depakote  levels 11/20/2024-60           Depakote  changed to 500 mg every morning and 1500 mg nightly-in next 5 days 11/13/2024 Depakote  level-44 Zyprexa  20 mg nightly Given significant delusion we will start patient on additional Invega  and taper it up to address ongoing hallucinations and delusions.  And potentially switching to LAI if possible. Added 1nvega 3 mg daily.  BMP-within normal limits Occupational Therapy evaluation for assessing appropriate level of care -- The risks/benefits/side-effects/alternatives to this medication were discussed in detail with the patient and time was given for questions. The patient consents to medication trial.                -- Metabolic profile and EKG monitoring obtained while on an atypical antipsychotic (BMI: Lipid Panel: HbgA1c: QTc:)              -- Encouraged patient to participate in unit milieu and in scheduled group therapies   Occupational Therapy recommendations If plan is discharge home, recommend the following:   Direct supervision/assist for medications management;Direct supervision/assist for  financial management;Assist for transportation;Assistance with cooking/housework    Shari [name] was seen for OT treatment on this date. Upon arrival to room pt in dayroom, agreeable to tx. The SLUMS is a 30-point screening questionnaire that tests orientation, memory, attention, problem solving, and executive function. Pt scored a 13/30 indicating Dementia. Of note, it is not within occupational therapy scope of practice to diagnose cognitive impairments, this screen indicates need for further testing. Pt with noted impairments in memory and problem solving limiting ability to participate functionally in medication management, bill management, and safety cooking. Will continue to follow POC. Continue to recommend SUPERVISION for safety with iADLs.    [facility] Mental Status Examination Orientation: 2/3  Calculations: 0/3 Naming animals: 2/3 Patient named 14 animals (0 points is 0-4 animals; 1 is 5-9 animals; 2 is 10-14 animals; 3 is 15+ animals) Recall: 3/5  Attention: 1/2 Clock drawing: 1/4 Visual Processing: 2/2 Paragraph Memory: 2/8  Total:  13/30;  Given that patient has completed high school, this score falls in the dementia range.   4. Discharge Planning:   -- Social work and case management to assist with discharge planning and identification of hospital follow-up needs prior to discharge  -- Estimated LOS: 3-4 days  Desmond Chimera, MD 12/03/2024, 12:34 PM

## 2024-12-03 NOTE — Group Note (Signed)
 LCSW Group Therapy Note   Group Date: 12/03/2024 Start Time: 1330 End Time: 1400   Type of Therapy and Topic:  Group Therapy: Challenging Core Beliefs  Participation Level:  Minimal  Description of Group:  Patients were educated about core beliefs and asked to identify one harmful core belief that they have. Patients were asked to explore from where those beliefs originate. Patients were asked to discuss how those beliefs make them feel and the resulting behaviors of those beliefs. They were then be asked if those beliefs are true and, if so, what evidence they have to support them. Lastly, group members were challenged to replace those negative core beliefs with helpful beliefs.   Therapeutic Goals:   1. Patient will identify harmful core beliefs and explore the origins of such beliefs. 2. Patient will identify feelings and behaviors that result from those core beliefs. 3. Patient will discuss whether such beliefs are true. 4.  Patient will replace harmful core beliefs with helpful ones.  Summary of Patient Progress:  Shari [name] actively engaged in processing and exploring how core beliefs are formed and how they impact thoughts, feelings, and behaviors. Patient proved open to input from peers and feedback from CSW. Patient demonstrated fair insight into the subject matter, was respectful and supportive of peers, and participated throughout the entire session.  Therapeutic Modalities: Cognitive Behavioral Therapy; Solution-Focused Therapy   Moet Mikulski D Oaklen Thiam, [place] 12/03/2024  1:58 PM

## 2024-12-03 NOTE — Progress Notes (Signed)
 Occupational Therapy Treatment Patient Details Name: Shari [name] MRN: [id number] DOB: 1956-01-04 Today's Date: 12/03/2024   History of present illness 68 year old female with a history of Schizoaffective Disorder, Bipolar Type who presents via GPD under IVC. Per IVC, pt refused medications, wasn't sleeping, responding to internal stimuli, verbally aggressive.   OT comments  Pt seen for OT tx and updated OT goals. Pt pleasant, agreeable. Continues to be indep with basic mobility and ADL tasks. Session focused on IADL tasks including bill paying/money management and situational safety awareness. Pt required extensive assist with bill paying/money mgt activity, was not able to complete independently. Demo'd difficulty with identifying correct due dates, evaluating which bill was due first, and whether she was able to pay both bills at the same time. She was able to identify ~50% of unsafe situations during activity. At present, pt unable to demonstrate safety/indep with IADL tasks. Recommend supervision/assist with all aspects of medication mgt, financial mgt. Will continue to progress towards OT goals as able. Goals adjusted as appropriate.       If plan is discharge home, recommend the following:  Direct supervision/assist for medications management;Direct supervision/assist for financial management;Assist for transportation;Assistance with cooking/housework;Supervision due to cognitive status   Equipment Recommendations  None recommended by OT    Recommendations for Other Services      Precautions / Restrictions Precautions Precautions: None Restrictions Weight Bearing Restrictions Per Provider Order: No       Mobility Bed Mobility Overal bed mobility: Independent    Transfers Overall transfer level: Independent Equipment used: None         Balance Overall balance assessment: No apparent balance deficits (not formally assessed)       ADL either performed or assessed with  clinical judgement   ADL Overall ADL's : Independent               Communication Communication Communication: No apparent difficulties   Cognition Arousal: Alert Behavior During Therapy: WFL for tasks assessed/performed Cognition: No family/caregiver present to determine baseline, Cognition impaired   Orientation impairments: Situation Awareness: Online awareness impaired, Intellectual awareness impaired     Executive functioning impairment (select all impairments): Organization, Sequencing, Problem solving, Reasoning                   Following commands: Intact        Cueing   Cueing Techniques: Verbal cues  Exercises Other Exercises Other Exercises: Session focused on IADL tasks including bill paying/money management and situational safety awareness. Pt required extensive assist with bill paying/money mgt activity, was not able to complete independently. She was able to identify ~50% of unsafe situations during activity.    Shoulder Instructions       General Comments      Pertinent Vitals/ Pain       Pain Assessment Pain Assessment: No/denies pain   Frequency  Min 1X/week        Progress Toward Goals  OT Goals(current goals can now be found in the care plan section)  Progress towards OT goals: Progressing toward goals;Goals drowngraded-see care plan;Goals updated  Acute Rehab OT Goals Patient Stated Goal: go home and be independent OT Goal Formulation: With patient Time For Goal Achievement: 12/17/24 Potential to Achieve Goals: Fair ADL Goals Additional ADL Goal #1: Pt will complete medication mgt using pill box test with MIN VC, 2/2 opportunities. Additional ADL Goal #2: Pt will complete bill mgt task with MIN VC, 2/2 opportunities.   AM-PAC OT 6  Clicks Daily Activity     Outcome Measure   Help from another person eating meals?: None Help from another person taking care of personal grooming?: None Help from another person toileting,  which includes using toliet, bedpan, or urinal?: None Help from another person bathing (including washing, rinsing, drying)?: None Help from another person to put on and taking off regular upper body clothing?: None Help from another person to put on and taking off regular lower body clothing?: None 6 Click Score: 24    End of Session    OT Visit Diagnosis: Other symptoms and signs involving cognitive function   Activity Tolerance Patient tolerated treatment well   Patient Left Other (comment) (in day room)   Nurse Communication Other (comment) (pt concerns with meds/interactions and burning stuck feeling after drinking warm/hot drinks)        Time: 8451-8394 OT Time Calculation (min): 17 min  Charges: OT General Charges $OT Visit: 1 Visit OT Treatments $Therapeutic Activity: 8-22 mins  Warren SAUNDERS., MPH, MS, OTR/L ascom [phone] 12/03/2024, 4:31 PM

## 2024-12-04 NOTE — [facility] Group Notes (Signed)
 Spirituality Group   Description: Participant directed exploration of values, beliefs and meaning   **Group focused on setting intentions for the year ahead: What do we wish to leave behind/what do we claim going forward   Following a brief framework of chaplains role and ground rules of group behavior, participants are invited to share concerns or questions that engage spiritual life. Emphasis placed on common themes and shared experiences and ways to make meaning and clarify living into ones values.   Theory/Process/Goal: Utilize the theoretical framework of group therapy established by Celena Kite, Relational Cultural Theory and Rogerian approaches to facilitate relational empathy and use of the here and now to foster reflection, self-awareness, and sharing.   Observations: Shari [name] was reserved but passively engaged in the group discussion based on body language, non-verbal responsiveness.  Shari [name] L. Shari [name] HERO.Div

## 2024-12-04 NOTE — Progress Notes (Signed)
" °   12/03/24 2100  Psych Admission Type (Psych Patients Only)  Admission Status Involuntary  Psychosocial Assessment  Patient Complaints None  Eye Contact Fair  Facial Expression Flat  Affect Appropriate to circumstance  Speech Logical/coherent  Interaction Minimal  Motor Activity Slow  Appearance/Hygiene Unremarkable  Behavior Characteristics Appropriate to situation;Cooperative  Mood Pleasant  Thought Process  Coherency WDL  Content WDL  Delusions None reported or observed  Perception WDL  Hallucination None reported or observed  Judgment Impaired  Confusion Mild  Danger to Self  Current suicidal ideation? Denies  Self-Injurious Behavior No self-injurious ideation or behavior indicators observed or expressed   Agreement Not to Harm Self Yes  Description of Agreement verbal  Danger to Others  Danger to Others None reported or observed   Mood/Behavior:  Pleasant and cooperative. Pt wants to speak with the provider regarding new medication.    Psych assessment: Denies SI/HI and AVH.     Interaction / Group attendance:  Present in the milieu. Minimal interaction with peers and staff.  Attended group.   Medication/ PRNs: Compliant with scheduled medications. Required PRNs Trazodone  for sleep and noted effective. Required prn Tylenol  for shoulder pain noted effective.    Pain: R shoulder pain    15 min checks in place for safety. "

## 2024-12-04 NOTE — Group Note (Signed)
 Date:  12/04/2024 Time:  3:38 PM  Group Topic/Focus:  Coping With Mental Health Crisis:   The purpose of this group is to help patients identify strategies for coping with mental health crisis.  Group discusses possible causes of crisis and ways to manage them effectively.    Participation Level:  Did Not Attend    [name] 12/04/2024, 3:38 PM

## 2024-12-04 NOTE — Progress Notes (Signed)
 Patient is an involuntary admission to [facility] for hx SAD bipolar with APS involvement and awaiting placement.  Patient is calm and cooperative.  Independent with all ADL's and ambulates without assistance.  Patient denies SI, HI, AVH, anxiety and depression.  She takes her medications whole. Attends meals and usually attends group.  Will continue to monitor.

## 2024-12-04 NOTE — BH IP Treatment Plan (Signed)
 Interdisciplinary Treatment and Diagnostic Plan Update  12/04/2024 Time of Session: 3:00 PM Shari [name] MRN: [id number]  Principal Diagnosis: Schizophrenia, paranoid (HCC)  Secondary Diagnoses: Principal Problem:   Schizophrenia, paranoid (HCC)   Current Medications:  Current Facility-Administered Medications  Medication Dose Route Frequency Provider Last Rate Last Admin   acetaminophen  (TYLENOL ) tablet 650 mg  650 mg Oral Q6H PRN Tex Drilling, NP   650 mg at 12/03/24 2136   alum & mag hydroxide-simeth (MAALOX/MYLANTA) 200-200-20 MG/5ML suspension 30 mL  30 mL Oral Q4H PRN Tex Drilling, NP       cloNIDine  (CATAPRES ) tablet 0.1 mg  0.1 mg Oral BID PRN Tex Drilling, NP       divalproex  (DEPAKOTE ) DR tablet 1,500 mg  1,500 mg Oral QHS Jadapalle, Sree, MD   1,500 mg at 12/03/24 2137   divalproex  (DEPAKOTE ) DR tablet 500 mg  500 mg Oral q AM Jadapalle, Sree, MD   500 mg at 12/04/24 9143   losartan  (COZAAR ) tablet 25 mg  25 mg Oral Daily Nkwenti, Doris, NP   25 mg at 12/04/24 9142   magnesium  hydroxide (MILK OF MAGNESIA) suspension 30 mL  30 mL Oral Daily PRN Tex Drilling, NP       menthol  (CEPACOL) lozenge 3 mg  1 lozenge Oral PRN Bobbitt, Shalon E, NP   3 mg at 12/03/24 2137   methimazole  (TAPAZOLE ) tablet 10 mg  10 mg Oral Daily Nkwenti, Doris, NP   10 mg at 12/04/24 9143   OLANZapine  (ZYPREXA ) injection 5 mg  5 mg Intramuscular TID PRN Tex Drilling, NP       OLANZapine  (ZYPREXA ) injection 5 mg  5 mg Intramuscular TID PRN Tex Drilling, NP       OLANZapine  (ZYPREXA ) tablet 20 mg  20 mg Oral QHS Jadapalle, Sree, MD   20 mg at 12/03/24 2137   OLANZapine  zydis (ZYPREXA ) disintegrating tablet 5 mg  5 mg Oral TID PRN Tex Drilling, NP       OLANZapine  zydis (ZYPREXA ) disintegrating tablet 5 mg  5 mg Oral TID PRN Tex Drilling, NP       paliperidone  (INVEGA ) 24 hr tablet 3 mg  3 mg Oral Daily Shrivastava, Aryendra, MD   3 mg at 12/04/24 9141   pantoprazole  (PROTONIX ) EC tablet 40  mg  40 mg Oral Daily Nkwenti, Doris, NP   40 mg at 12/04/24 9142   traZODone  (DESYREL ) tablet 50 mg  50 mg Oral QHS PRN Tex Drilling, NP   50 mg at 12/03/24 2137   PTA Medications: Medications Prior to Admission  Medication Sig Dispense Refill Last Dose/Taking   Ascorbic Acid (VITAMIN C PO) Take 1 tablet by mouth daily. (Patient not taking: Reported on 10/14/2024)      divalproex  (DEPAKOTE ) 500 MG DR tablet Take 1 tablet (500 mg total) by mouth at bedtime. (Patient not taking: Reported on 10/14/2024) 30 tablet 1    losartan  (COZAAR ) 25 MG tablet Take 1 tablet (25 mg total) by mouth daily. (Patient not taking: Reported on 10/14/2024) 90 tablet 1    methimazole  (TAPAZOLE ) 10 MG tablet Take 1 tablet (10 mg total) by mouth daily. (Patient not taking: Reported on 10/14/2024) 90 tablet 1    OLANZapine  (ZYPREXA ) 10 MG tablet Take 1 tablet (10 mg total) by mouth at bedtime. (Patient not taking: Reported on 10/14/2024) 30 tablet 1    pantoprazole  (PROTONIX ) 40 MG tablet Take 1 tablet (40 mg total) by mouth daily. (Patient not taking: Reported on 10/14/2024)  90 tablet 0    propranolol  (INDERAL ) 10 MG tablet Take 1 tablet (10 mg total) by mouth 2 (two) times daily. (Patient not taking: Reported on 10/14/2024) 180 tablet 1    VITAMIN D PO Take 1 tablet by mouth daily. (Patient not taking: Reported on 10/14/2024)      VITAMIN E PO Take 1 tablet by mouth daily. (Patient not taking: Reported on 10/14/2024)       Patient Stressors: Medication change or noncompliance    Patient Strengths: Communication skills   Treatment Modalities: Medication Management, Group therapy, Case management,  1 to 1 session with clinician, Psychoeducation, Recreational therapy.   Physician Treatment Plan for Primary Diagnosis: Schizophrenia, paranoid (HCC) Long Term Goal(s): Improvement in symptoms so as ready for discharge   Short Term Goals: Ability to identify changes in lifestyle to reduce recurrence of condition will  improve Ability to verbalize feelings will improve Ability to disclose and discuss suicidal ideas Ability to demonstrate self-control will improve Ability to identify and develop effective coping behaviors will improve Ability to maintain clinical measurements within normal limits will improve  Medication Management: Evaluate patient's response, side effects, and tolerance of medication regimen.  Therapeutic Interventions: 1 to 1 sessions, Unit Group sessions and Medication administration.  Evaluation of Outcomes: Progressing  Physician Treatment Plan for Secondary Diagnosis: Principal Problem:   Schizophrenia, paranoid (HCC)  Long Term Goal(s): Improvement in symptoms so as ready for discharge   Short Term Goals: Ability to identify changes in lifestyle to reduce recurrence of condition will improve Ability to verbalize feelings will improve Ability to disclose and discuss suicidal ideas Ability to demonstrate self-control will improve Ability to identify and develop effective coping behaviors will improve Ability to maintain clinical measurements within normal limits will improve     Medication Management: Evaluate patient's response, side effects, and tolerance of medication regimen.  Therapeutic Interventions: 1 to 1 sessions, Unit Group sessions and Medication administration.  Evaluation of Outcomes: Progressing   RN Treatment Plan for Primary Diagnosis: Schizophrenia, paranoid (HCC) Long Term Goal(s): Knowledge of disease and therapeutic regimen to maintain health will improve  Short Term Goals: Ability to remain free from injury will improve, Ability to verbalize frustration and anger appropriately will improve, Ability to demonstrate self-control, Ability to participate in decision making will improve, Ability to verbalize feelings will improve, Ability to disclose and discuss suicidal ideas, Ability to identify and develop effective coping behaviors will improve, and  Compliance with prescribed medications will improve  Medication Management: RN will administer medications as ordered by provider, will assess and evaluate patient's response and provide education to patient for prescribed medication. RN will report any adverse and/or side effects to prescribing provider.  Therapeutic Interventions: 1 on 1 counseling sessions, Psychoeducation, Medication administration, Evaluate responses to treatment, Monitor vital signs and CBGs as ordered, Perform/monitor CIWA, COWS, AIMS and Fall Risk screenings as ordered, Perform wound care treatments as ordered.  Evaluation of Outcomes: Progressing   LCSW Treatment Plan for Primary Diagnosis: Schizophrenia, paranoid (HCC) Long Term Goal(s): Safe transition to appropriate next level of care at discharge, Engage patient in therapeutic group addressing interpersonal concerns.  Short Term Goals: Engage patient in aftercare planning with referrals and resources, Increase social support, Increase ability to appropriately verbalize feelings, Increase emotional regulation, Facilitate acceptance of mental health diagnosis and concerns, Facilitate patient progression through stages of change regarding substance use diagnoses and concerns, Identify triggers associated with mental health/substance abuse issues, and Increase skills for wellness and recovery  Therapeutic  Interventions: Assess for all discharge needs, 1 to 1 time with Child psychotherapist, Explore available resources and support systems, Assess for adequacy in community support network, Educate family and significant other(s) on suicide prevention, Complete Psychosocial Assessment, Interpersonal group therapy.  Evaluation of Outcomes: Progressing   Progress in Treatment: Attending groups: Yes. and No. Participating in groups: Yes. and No. Taking medication as prescribed: Yes. Toleration medication: Yes. Family/Significant other contact made: No, will contact:  once  permission has been granted  Patient understands diagnosis: No. Discussing patient identified problems/goals with staff: Yes. Medical problems stabilized or resolved: Yes. Denies suicidal/homicidal ideation: Yes. Issues/concerns per patient self-inventory: No. Other: none   New problem(s) identified: No, Describe:  None identified 10/23/24 Update: No changes at this time. 10/28/24 Update: No changes at this time. Update 11/03/24: No changes at this time Update 11/09/24: No changes at this time Update 11/14/24: No changes at this time    11/24/24 Update: No changes at this time.  Update 11/29/2024: No changes at this time.   Update 12/04/2024: No changes at this time.     New Short Term/Long Term Goal(s): elimination of symptoms of psychosis, medication management for mood stabilization; elimination of SI thoughts; development of comprehensive mental wellness plan. 10/23/24 Update: No changes at this time. 10/28/24 Update: No changes at this time. Update 11/03/24: No changes at this time Update 11/09/24: No changes at this time Update 11/14/24: No changes at this time. 11/24/24 Update: No changes at this time.  Update 11/29/2024: No changes at this time.   Update 12/04/2024: No changes at this time.    Patient Goals:  I haven't set any goals for the hospital yet. I don't need psychiatric help 10/23/24 Update: No changes at this time. 10/28/24 Update: No changes at this time. Update 11/03/24: No changes at this time Update 11/09/24: No changes at this time Update 11/14/24: No changes at this time2/21/25 Update: No changes at this time. Update 11/29/2024: No changes at this time.   Update 12/04/2024: No changes at this time.   Discharge Plan or Barriers: CSW will assist with appropriate discharge plan 10/23/24 Update: No changes at this time. 10/28/24 Update: CSW to continue to meet with DSS to engage in safe discharge planning and connect patient with appropriate resources. Update 11/03/24: No changes  at this time Update 11/09/24: No changes at this time Update 11/14/24: APS drafting a petition for guardianship, awaiting updates regarding scheduled court date at this time. APS has received FL2 to begin placement search. 11/24/24 Update: No changes at this time.  Update 11/29/2024: Placement continues to be sought at this time.  Update 12/04/2024: No updates on placement search at this time  Reason for Continuation of Hospitalization: Delusions  Medication stabilization 10/28/24 Update: Delusions  Medication stabilization 11/24/24 Update: No changes at this time. Update 11/29/2024: No changes at this time.  Update 12/04/2024: No changes at this time.       Estimated Length of Stay: 1 to 7 days 10/23/24 Update: No changes at this time. 10/28/24 Update: TBD. Update 11/03/24: TBD Update 11/09/24: TBD Update 11/14/24: TBD 11/24/24 Update: TBD Update 11/29/2024: TBD Update 12/04/2024: TBD      Last 3 [place] Suicide Severity Risk Score: Flowsheet Row Admission (Current) from 10/17/2024 in [facility] ED from 10/14/2024 in [facility] ED to Hosp-Admission (Discharged) from 03/13/2024 in [facility]  C-SSRS RISK CATEGORY No Risk No Risk No Risk    Last [facility] 2/9  Scores:     No data to display          Scribe for Treatment Team: [name], ISRAEL 12/04/2024 11:33 AM

## 2024-12-04 NOTE — Group Note (Signed)
 Date:  12/04/2024 Time:  10:38 AM  Group Topic/Focus:    For geriatric patients, combining gentle, low-impact exercises like walking, chair yoga, or Tai Chi with mindfulness and meditation (breathing, body scans) significantly boosts physical (balance, strength) and mental (less anxiety/depression, better focus) health, improving overall well-being by managing chronic pain and enhancing emotional resilience. Adaptations are key: use chairs for stability, focus on simple guided sessions, and prioritize comfort for balance, mobility, and sensory issues.  Meditation is a simple yet powerful way to support your emotional and physical well-being. Regular practice can help lower blood pressure, ease anxiety, and improve focus and sleep. From deep breathing and body scans to guided imagery and mindfulness, there are many styles to explore.  Participation Level:  Did Not Attend    Harlene LITTIE Gavel 12/04/2024, 10:38 AM

## 2024-12-04 NOTE — Plan of Care (Signed)

## 2024-12-05 DIAGNOSIS — F200 Paranoid schizophrenia: Secondary | ICD-10-CM | POA: Diagnosis not present

## 2024-12-05 DIAGNOSIS — F0390 Unspecified dementia without behavioral disturbance: Secondary | ICD-10-CM | POA: Diagnosis not present

## 2024-12-05 NOTE — Group Note (Signed)
 Date:  12/05/2024 Time:  8:32 PM  Group Topic/Focus:  Wrap-Up Group:   The focus of this group is to help patients review their daily goal of treatment and discuss progress on daily workbooks.    Participation Level:  Active  Participation Quality:  Appropriate  Affect:  Appropriate  Cognitive:  Appropriate  Insight: Appropriate  Engagement in Group:  Engaged  Modes of Intervention:  Discussion  Additional Comments:    Shari [name] 12/05/2024, 8:32 PM

## 2024-12-05 NOTE — Plan of Care (Signed)
  Problem: Coping: Goal: Ability to verbalize frustrations and anger appropriately will improve Outcome: Progressing Goal: Ability to demonstrate self-control will improve Outcome: Progressing   Problem: Health Behavior/Discharge Planning: Goal: Identification of resources available to assist in meeting health care needs will improve Outcome: Progressing   

## 2024-12-05 NOTE — Group Note (Signed)
 Date:  12/05/2024 Time:  11:04 AM  Group Topic/Focus:  Making Healthy Choices:   The focus of this group is to help patients identify negative/unhealthy choices they were using prior to admission and identify positive/healthier coping strategies to replace them upon discharge.    Participation Level:  Active  Participation Quality:  Appropriate and Attentive  Affect:  Appropriate  Cognitive:  Alert  Insight: Appropriate  Engagement in Group:  Engaged  Modes of Intervention:  Activity  Additional Comments:     Maglione,Britne Borelli E 12/05/2024, 11:04 AM

## 2024-12-05 NOTE — Progress Notes (Signed)
 Patient pleasant during interaction. Accepted medications without issue. Patient denies SI/HI/AVH. Can be observed responding the internal stimuli. Denies pain. LBM today.  12/05/24 1200  Psych Admission Type (Psych Patients Only)  Admission Status Involuntary  Psychosocial Assessment  Patient Complaints None  Eye Contact Fair  Facial Expression Flat  Affect Appropriate to circumstance  Speech Logical/coherent  Interaction Minimal  Motor Activity Slow  Appearance/Hygiene Unremarkable  Behavior Characteristics Appropriate to situation  Mood Pleasant  Thought Process  Coherency WDL  Content WDL  Delusions None reported or observed  Perception WDL  Hallucination None reported or observed  Judgment Impaired  Confusion Mild  Danger to Self  Current suicidal ideation? Denies  Danger to Others  Danger to Others None reported or observed

## 2024-12-05 NOTE — Group Note (Signed)
 Recreation Therapy Group Note   Group Topic:Goal Setting  Group Date: 12/05/2024 Start Time: 1330 End Time: 1405 Facilitators: Celestia Jeoffrey BRAVO, LRT, CTRS Location: Dayroom  Group Description: New Years Resolutions. Patients received a handout for them to write down their new years resolutions on it. The handout had a decorative border around the paper that patients could also color once they're finished writing their new year resolutions down. Afterwards, LRT encouraged patients to read out loud their new years resolutions to the group.   Goal Area(s) Addressed: Patient will increase communication,  Patient will identify a goal leading into the new year.  Patient will foster mutual support. Patient will express themselves through art.    Affect/Mood: N/A   Participation Level: Did not attend    Clinical Observations/Individualized Feedback: Patient did not attend.  Plan: Continue to engage patient in RT group sessions 2-3x/week.   Jeoffrey BRAVO Celestia, LRT, CTRS 12/05/2024 4:24 PM

## 2024-12-05 NOTE — Plan of Care (Signed)
   Problem: Activity: Goal: Interest or engagement in activities will improve Outcome: Progressing Goal: Sleeping patterns will improve Outcome: Progressing

## 2024-12-05 NOTE — Group Note (Signed)
 [facility] LCSW Group Therapy Note   Group Date: 12/05/2024 Start Time: 1300 End Time: 1330  Type of Therapy and Topic:  Group Therapy:  Feelings around Relapse and Recovery  Participation Level:  Did Not Attend   Description of Group:    Patients in this group will discuss emotions they experience before and after a relapse. They will process how experiencing these feelings, or avoidance of experiencing them, relates to having a relapse. Facilitator will guide patients to explore emotions they have related to recovery. Patients will be encouraged to process which emotions are more powerful. They will be guided to discuss the emotional reaction significant others in their lives may have to patients relapse or recovery. Patients will be assisted in exploring ways to respond to the emotions of others without this contributing to a relapse.  Therapeutic Goals: Patient will identify two or more emotions that lead to relapse for them:  Patient will identify two emotions that result when they relapse:  Patient will identify two emotions related to recovery:  Patient will demonstrate ability to communicate their needs through discussion and/or role plays.   Summary of Patient Progress: Patient did not attend group.   Therapeutic Modalities:   Cognitive Behavioral Therapy Solution-Focused Therapy Assertiveness Training Relapse Prevention Therapy   Nadara JONELLE Fam, LCSW

## 2024-12-05 NOTE — Progress Notes (Signed)
" °   12/04/24 2230  Psych Admission Type (Psych Patients Only)  Admission Status Involuntary  Psychosocial Assessment  Patient Complaints None  Eye Contact Fair  Facial Expression Flat  Affect Appropriate to circumstance  Speech Logical/coherent  Interaction Minimal  Motor Activity Slow  Appearance/Hygiene Unremarkable  Behavior Characteristics Appropriate to situation  Mood Pleasant  Thought Process  Coherency WDL  Content WDL  Delusions None reported or observed  Perception WDL  Hallucination None reported or observed  Judgment Impaired  Confusion Mild  Danger to Self  Current suicidal ideation? Denies  Self-Injurious Behavior No self-injurious ideation or behavior indicators observed or expressed   Agreement Not to Harm Self Yes  Description of Agreement verbal  Danger to Others  Danger to Others None reported or observed    Estimated Sleeping Duration (Last 24 Hours): 7.25-10.00 hours  "

## 2024-12-05 NOTE — Group Note (Signed)
 Date:  12/05/2024 Time:  3:42 PM  Group Topic/Focus:  Goals Group:   The focus of this group is to help patients establish daily goals to achieve during treatment and discuss how the patient can incorporate goal setting into their daily lives to aide in recovery.    Participation Level:  Active  Participation Quality:  Appropriate  Affect:  Appropriate  Cognitive:  Appropriate  Insight: Appropriate  Engagement in Group:  Engaged  Modes of Intervention:  Discussion   [name] 12/05/2024, 3:42 PM

## 2024-12-06 NOTE — Group Note (Signed)
 Date:  12/06/2024 Time:  3:39 PM  Group Topic/Focus:  Coping With Mental Health Crisis:   The purpose of this group is to help patients identify strategies for coping with mental health crisis.  Group discusses possible causes of crisis and ways to manage them effectively.    Participation Level:  Active  Participation Quality:  Appropriate  Affect:  Appropriate  Cognitive:  Appropriate  Insight: Appropriate  Engagement in Group:  Engaged  Modes of Intervention:  Education  Additional Comments:  none  Shari [name] Bias 12/06/2024, 3:39 PM

## 2024-12-06 NOTE — Group Note (Signed)
 Recreation Therapy Group Note   Group Topic:Emotion Expression  Group Date: 12/06/2024 Start Time: 1400 End Time: 1430 Facilitators: Celestia Jeoffrey BRAVO, LRT, CTRS Location: Courtyard  Group Description: Music. Patients are encouraged to name their favorite song(s) for LRT to play song through speaker for group to hear, while in the courtyard getting fresh air and sunlight. Patients educated on the definition of leisure and the importance of having different leisure interests outside of the hospital. Group discussed how leisure activities can often be used as pharmacologist and that listening to music and being outside are examples.    Goal Area(s) Addressed:  Patient will identify a current leisure interest.  Patient will practice making a positive decision. Patient will have the opportunity to try a new leisure activity.   Affect/Mood: Appropriate   Participation Level: Active and Engaged   Participation Quality: Independent   Behavior: Calm and Cooperative   Speech/Thought Process: Coherent   Insight: Fair   Judgement: Fair    Modes of Intervention: Guided Discussion, Music, and Support   Patient Response to Interventions:  Attentive, Engaged, and Receptive   Education Outcome:  Acknowledges education   Clinical Observations/Individualized Feedback: Jullianna was active in their participation of session activities and group discussion. Pt identified I like all types as her favorite music. Pt interacted well with LRT and peers duration of session.    Plan: Continue to engage patient in RT group sessions 2-3x/week.   Jeoffrey BRAVO Celestia, LRT, CTRS 12/06/2024 4:51 PM

## 2024-12-06 NOTE — Progress Notes (Signed)
" °   12/06/24 1000  Psych Admission Type (Psych Patients Only)  Admission Status Involuntary  Psychosocial Assessment  Patient Complaints None  Eye Contact Fair  Facial Expression Flat  Affect Appropriate to circumstance  Speech Logical/coherent  Interaction Minimal  Motor Activity Slow  Appearance/Hygiene Unremarkable  Behavior Characteristics Appropriate to situation  Mood Pleasant  Thought Process  Coherency WDL  Content WDL  Delusions None reported or observed  Perception WDL  Hallucination None reported or observed  Judgment Impaired  Confusion Mild  Danger to Self  Current suicidal ideation? Denies  Agreement Not to Harm Self Yes  Description of Agreement verbal  Danger to Others  Danger to Others None reported or observed    "

## 2024-12-06 NOTE — Group Note (Signed)
 Physical/Occupational Therapy Group Note  Group Topic: Neurographic Art  Group Date: 12/06/2024 Start Time: 1305 End Time: 1355 Facilitators: Clive Warren CROME, OT   Group Description: Group participated with Neurographic art activity, using watercolor paints to facilitate creative expression and meditation/relaxation for each individual.  Incorporated bimanual coordination, mental focus, emotional processing, task/command following and relaxation techniques as appropriate.  Patients engaged socially with therapist and other group participants throughout session. Allowed to ask questions as appropriate, and encouraged to identify ways they could use/share their creations with themselves and others.  Therapeutic Goal(s):  Demonstrate ability to independently manipulate utensils required to participate with and complete activity. Demonstrate ability to cognitively focus on task and follow commands necessary for completion. Demonstrate use of art as an outlet for emotional processing and expression. Identify and demonstrate importance of relaxation, neural calming and meditation for improved participation with life groups.  Individual Participation: Pt participatory throughout, requiring MOD VC for instruction. Increased time/effort to complete <1/2 of the project. Receptive to feedback and cues but with limited carryover. Pt engaged in group discussion with prompting.   Participation Level: Active and Engaged   Participation Quality: Minimal Cues   Behavior: Alert, Calm, Cooperative, and Off-task   Speech/Thought Process: Relevant   Affect/Mood: Appropriate and Stable    Insight: Impaired   Judgement: Impaired   Modes of Intervention: Activity, Clarification, Discussion, Education, Exploration, Problem-solving, Rapport Building, Socialization, and Support  Patient Response to Interventions:  Engaged and Interested    Plan: Continue to engage patient in PT/OT groups 1 - 2x/week.  Syretta Kochel  R., MPH, MS, OTR/L ascom [phone] 12/06/2024, 3:30 PM

## 2024-12-06 NOTE — Progress Notes (Signed)
" °   12/05/24 2300  Psych Admission Type (Psych Patients Only)  Admission Status Involuntary  Psychosocial Assessment  Patient Complaints None  Eye Contact Fair  Facial Expression Flat  Affect Appropriate to circumstance  Speech Logical/coherent  Interaction Minimal  Motor Activity Slow  Appearance/Hygiene Unremarkable  Behavior Characteristics Appropriate to situation  Mood Pleasant  Thought Process  Coherency WDL  Content WDL  Delusions None reported or observed  Perception WDL  Hallucination None reported or observed  Judgment Impaired  Confusion Mild  Danger to Self  Current suicidal ideation? Denies  Self-Injurious Behavior No self-injurious ideation or behavior indicators observed or expressed   Agreement Not to Harm Self Yes  Description of Agreement verbal  Danger to Others  Danger to Others None reported or observed    Estimated Sleeping Duration (Last 24 Hours): 7.25-8.25 hours  "

## 2024-12-06 NOTE — Progress Notes (Signed)
 [facility] MD Progress Note   9:06 AM Shari [name]  MRN:  [id number] Patient is a 69 year old female with a history of Schizoaffective Disorder, Bipolar Type who presents via [employer] under IVC, initiated by CM, Falencio with re-entry program, to [facility] for assessment. Per IVC, [name] has been diagnosed with Schizophrenia and is refusing to take medication. [name] isn't sleeping. Residents report her screaming and yelling all night long.  [name] responding to internal stimuli.  [name] verbally aggressive.  The respondent has locked all of the residents inside of the transitional housing home.  They could not leave the home which prompted the IVC.  Residents did not have access to food, the restroom or anything as a result of being locked in.  [name] is currently under Adult protective services care. Patient is admitted to [facility] unit with Q15 min safety monitoring. Multidisciplinary team approach is offered. Medication management; group/milieu therapy is offered.   Subjective:  Chart reviewed, case discussed in multidisciplinary meeting, patient seen during rounds.   Per staff report, the patient slept well overnight and is doing well overall, with no behavioral issues noted. During interview, the patient stated that she does not like taking medications. She continues to report auditory hallucinations, describing hearing voices from [place]. The patients psychiatric baseline is unclear; however, she appears able to function despite these experiences and reports that the voices are not distressing to her. She declined to elaborate further on the auditory hallucinations. She denies visual hallucinations and denies any suicidal or homicidal ideation. The patient also reports that her throat feels improved today.    Past Psychiatric History: see h&P Family History:  Family History  Problem Relation Age of Onset   Diabetes Mother    CAD Mother    Lung cancer Father     Social History:  Social History   Substance and Sexual Activity  Alcohol Use Yes   Comment: beer occasionally     Social History   Substance and Sexual Activity  Drug Use No    Social History   Socioeconomic History   Marital status: Widowed    Spouse name: Not on file   Number of children: Not on file   Years of education: Not on file   Highest education level: Not on file  Occupational History   Not on file  Tobacco Use   Smoking status: Every Day    Current packs/day: 0.50    Average packs/day: 0.5 packs/day for 40.0 years (20.0 ttl pk-yrs)    Types: Cigarettes   Smokeless tobacco: Never  Vaping Use   Vaping status: Never Used  Substance and Sexual Activity   Alcohol use: Yes    Comment: beer occasionally   Drug use: No   Sexual activity: Not Currently    Birth control/protection: Post-menopausal  Other Topics Concern   Not on file  Social History Narrative   Not on file   Social Drivers of Health   Tobacco Use: High Risk (11/02/2024)   Patient History    Smoking Tobacco Use: Every Day    Smokeless Tobacco Use: Never    Passive Exposure: Not on file  Financial Resource Strain: Not on file  Food Insecurity: No Food Insecurity (10/17/2024)   Epic    Worried About Programme Researcher, Broadcasting/film/video in the Last Year: Never true    Ran Out of Food in the Last Year: Never true  Transportation Needs: No Transportation Needs (10/17/2024)   Epic  Lack of Transportation (Medical): No    Lack of Transportation (Non-Medical): No  Recent Concern: Transportation Needs - Unmet Transportation Needs (10/14/2024)   Epic    Lack of Transportation (Medical): Yes    Lack of Transportation (Non-Medical): Yes  Physical Activity: Not on file  Stress: Not on file  Social Connections: Unknown (10/17/2024)   Social Connection and Isolation Panel    Frequency of Communication with Friends and Family: Patient unable to answer    Frequency of Social Gatherings with Friends and Family:  Patient unable to answer    Attends Religious Services: Patient unable to answer    Active Member of Clubs or Organizations: Patient declined    Attends Banker Meetings: Patient unable to answer    Marital Status: Widowed  Depression (PHQ2-9): Not on file  Alcohol Screen: Low Risk (10/17/2024)   Alcohol Screen    Last Alcohol Screening Score (AUDIT): 0  Housing: Low Risk (10/17/2024)   Epic    Unable to Pay for Housing in the Last Year: No    Number of Times Moved in the Last Year: 0    Homeless in the Last Year: No  Utilities: Not At Risk (10/17/2024)   Epic    Threatened with loss of utilities: No  Health Literacy: Not on file   Past Medical History:  Past Medical History:  Diagnosis Date   Eye globe prosthesis    GERD (gastroesophageal reflux disease)    History of blood transfusion 1973   related to abscess burst in my stomach   Hyperlipemia    Hyperlipidemia    Hypertension    Osteoarthritis of back    Lowerback    SVD (spontaneous vaginal delivery)    x 1, baby died at 2 wks of age   Type II diabetes mellitus (HCC)     Past Surgical History:  Procedure Laterality Date   APPENDECTOMY     COLONOSCOPY     DILATATION & CURETTAGE/HYSTEROSCOPY WITH MYOSURE N/A 02/25/2015   Procedure: DILATATION & CURETTAGE/HYSTEROSCOPY WITH MYOSURE;  Surgeon: Truman Corona, MD;  Location: [facility];  Service: Gynecology;  Laterality: N/A;   DILATION AND CURETTAGE OF UTERUS     ENUCLEATION Right 03/16/2017   ENUCLEATION Right 03/16/2017   Procedure: ENUCLEATION RIGHT EYE;  Surgeon: Loyd Kathryne Palm, MD;  Location: [facility] OR;  Service: Ophthalmology;  Laterality: Right;   EYE SURGERY     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6, 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   EYE SURGERY Right ~ 1974   S/P initial eye injury; scissors stuck in my eye   LAPAROSCOPIC CHOLECYSTECTOMY     SHOULDER ARTHROSCOPY WITH ROTATOR CUFF REPAIR Left    wire stitches per patient   SHOULDER ARTHROSCOPY WITH ROTATOR CUFF REPAIR Right      Current Medications: Current Facility-Administered Medications  Medication Dose Route Frequency Provider Last Rate Last Admin   acetaminophen  (TYLENOL ) tablet 650 mg  650 mg Oral Q6H PRN Tex Drilling, NP   650 mg at 12/05/24 2115   alum & mag hydroxide-simeth (MAALOX/MYLANTA) 200-200-20 MG/5ML suspension 30 mL  30 mL Oral Q4H PRN Tex Drilling, NP       cloNIDine  (CATAPRES ) tablet 0.1 mg  0.1 mg Oral BID PRN Tex Drilling, NP       divalproex  (DEPAKOTE ) DR tablet 1,500 mg  1,500 mg Oral QHS Jadapalle, Sree, MD   1,500 mg at 12/05/24 2114   divalproex  (DEPAKOTE ) DR tablet 500 mg  500 mg Oral q AM Jadapalle,  Sree, MD   500 mg at 12/06/24 9147   losartan  (COZAAR ) tablet 25 mg  25 mg Oral Daily Tex Drilling, NP   25 mg at 12/06/24 0851   magnesium  hydroxide (MILK OF MAGNESIA) suspension 30 mL  30 mL Oral Daily PRN Tex Drilling, NP       menthol  (CEPACOL) lozenge 3 mg  1 lozenge Oral PRN Bobbitt, Shalon E, NP   3 mg at 12/05/24 2131   methimazole  (TAPAZOLE ) tablet 10 mg  10 mg Oral Daily Nkwenti, Doris, NP   10 mg at 12/06/24 9147   OLANZapine  (ZYPREXA ) injection 5 mg  5 mg Intramuscular TID PRN Tex Drilling, NP       OLANZapine  (ZYPREXA ) injection 5 mg  5 mg Intramuscular TID PRN Tex Drilling, NP       OLANZapine  (ZYPREXA ) tablet 20 mg  20 mg Oral QHS Jadapalle, Sree, MD   20 mg at 12/05/24 2115   OLANZapine  zydis (ZYPREXA ) disintegrating tablet 5 mg  5 mg Oral TID PRN Tex Drilling, NP       OLANZapine  zydis (ZYPREXA ) disintegrating tablet 5 mg  5 mg Oral TID PRN Tex Drilling, NP       paliperidone  (INVEGA ) 24 hr tablet 3 mg  3 mg Oral Daily Shrivastava, Aryendra, MD   3 mg at 12/06/24 9147   pantoprazole  (PROTONIX ) EC tablet 40 mg  40 mg Oral Daily Nkwenti, Doris, NP   40 mg at 12/06/24 9147   traZODone  (DESYREL ) tablet 50 mg  50 mg Oral QHS PRN Tex Drilling, NP   50 mg at 12/05/24 2115    Lab Results:  No results found for this or any previous visit (from the past 48  hours).       Blood Alcohol level:  Lab Results  Component Value Date   [facility] <15 10/14/2024   ETH <10 07/02/2022    Metabolic Disorder Labs: Lab Results  Component Value Date   HGBA1C 5.5 10/14/2024   MPG 111.15 10/14/2024   MPG 137 01/06/2022   Lab Results  Component Value Date   PROLACTIN 5.8 10/14/2024   Lab Results  Component Value Date   CHOL 167 10/14/2024   TRIG 68 10/14/2024   HDL 60 10/14/2024   CHOLHDL 2.8 10/14/2024   VLDL 14 10/14/2024   LDLCALC 93 10/14/2024   LDLCALC 73 03/13/2024    Physical Findings: AIMS:  , ,  ,  ,    CIWA:    COWS:      Psychiatric Specialty Exam:  Presentation  General Appearance:  Appropriate for Environment  Eye Contact: Fleeting  Speech: Normal Rate  Speech Volume: Decreased    Mood and Affect  Mood:fine  Affect: Flat   Thought Process  Thought Processes: impoverished  Orientation:Partial  Thought Content: Chronic delusions Hallucinations: Denies  Ideas of Reference:Delusions; Paranoia  Suicidal Thoughts: Denies  Homicidal Thoughts: Denies   Sensorium  Memory: Immediate Fair; Recent Fair  Judgment: Impaired  Insight: Shallow   Executive Functions  Concentration: Fair  Attention Span: Fair  Recall: Poor  Fund of Knowledge: Fair  Language: Fair   Psychomotor Activity  Psychomotor Activity: No data recorded  Musculoskeletal: Strength & Muscle Tone: within normal limits Gait & Station: normal Assets  Assets: Manufacturing Systems Engineer; Desire for Improvement; Social Support    Physical Exam: Physical Exam Vitals and nursing note reviewed.    ROS Blood pressure (!) 141/68, pulse 85, temperature (!) 97.4 F (36.3 C), resp. rate 16, height 5' 8 (  1.727 m), weight 48.3 kg, SpO2 97%. Body mass index is 16.19 kg/m.  Diagnosis: Principal Problem:   Schizophrenia, paranoid (HCC) Major neurocognitive disorder-slums total score of 13/30   Treatment Plan Summary:  APS referral has been made as patient lacks capacity to make medical decisions.  Recommending legal guardianship and further placement at appropriate level of care  Safety and Monitoring:             -- Involuntary admission to inpatient psychiatric unit for safety, stabilization and treatment             -- Daily contact with patient to assess and evaluate symptoms and progress in treatment             -- Patient's case to be discussed in multi-disciplinary team meeting             -- Observation Level: q15 minute checks             -- Vital signs:  q12 hours             -- Precautions: suicide, elopement, and assault   2. Psychiatric Diagnoses and Treatment:  Check Depakote  levels 11/20/2024-60           Depakote  changed to 500 mg every morning and 1500 mg nightly-in next 5 days 11/13/2024 Depakote  level-44 Zyprexa  20 mg nightly BMP-within normal limits Occupational Therapy evaluation for assessing appropriate level of care -- The risks/benefits/side-effects/alternatives to this medication were discussed in detail with the patient and time was given for questions. The patient consents to medication trial.                -- Metabolic profile and EKG monitoring obtained while on an atypical antipsychotic (BMI: Lipid Panel: HbgA1c: QTc:)              -- Encouraged patient to participate in unit milieu and in scheduled group therapies   Occupational Therapy recommendations If plan is discharge home, recommend the following:   Direct supervision/assist for medications management;Direct supervision/assist for financial management;Assist for transportation;Assistance with cooking/housework    Ms Grall was seen for OT treatment on this date. Upon arrival to room pt in dayroom, agreeable to tx. The SLUMS is a 30-point screening questionnaire that tests orientation, memory, attention, problem solving, and executive function. Pt scored a 13/30 indicating Dementia. Of note, it is not within  occupational therapy scope of practice to diagnose cognitive impairments, this screen indicates need for further testing. Pt with noted impairments in memory and problem solving limiting ability to participate functionally in medication management, bill management, and safety cooking. Will continue to follow POC. Continue to recommend SUPERVISION for safety with iADLs.    [facility] Mental Status Examination Orientation: 2/3  Calculations: 0/3 Naming animals: 2/3 Patient named 14 animals (0 points is 0-4 animals; 1 is 5-9 animals; 2 is 10-14 animals; 3 is 15+ animals) Recall: 3/5  Attention: 1/2 Clock drawing: 1/4 Visual Processing: 2/2 Paragraph Memory: 2/8  Total: 13/30;  Given that patient has completed high school, this score falls in the dementia range.   4. Discharge Planning:   -- Social work and case management to assist with discharge planning and identification of hospital follow-up needs prior to discharge  -- Estimated LOS: 3-4 days  Shari JONELLE Manners, MD 12/06/2024, 9:06 AM

## 2024-12-06 NOTE — Group Note (Signed)
 Date:  12/06/2024 Time:  8:28 PM  Group Topic/Focus:  Healthy Communication:   The focus of this group is to discuss communication, barriers to communication, as well as healthy ways to communicate with others.    Participation Level:  Active  Participation Quality:  Appropriate  Affect:  Appropriate  Cognitive:  Appropriate  Insight: Good  Engagement in Group:  Engaged  Modes of Intervention:  Discussion  Additional Comments:    Shari [name] 12/06/2024, 8:28 PM

## 2024-12-06 NOTE — Plan of Care (Signed)
  Problem: Activity: Goal: Sleeping patterns will improve Outcome: Progressing   Problem: Coping: Goal: Ability to demonstrate self-control will improve Outcome: Progressing   

## 2024-12-06 NOTE — Group Note (Signed)
 Date:  12/06/2024 Time:  12:13 PM  Group Topic/Focus:  Managing Feelings:   The focus of this group is to identify what feelings patients have difficulty handling and develop a plan to handle them in a healthier way upon discharge.    Participation Level:  Active  Participation Quality:  Appropriate and Attentive  Affect:  Appropriate  Cognitive:  Alert and Appropriate  Insight: Appropriate  Engagement in Group:  Engaged  Modes of Intervention:  Activity  Additional Comments:     Maglione,Brianna Esson E 12/06/2024, 12:13 PM

## 2024-12-07 NOTE — Plan of Care (Signed)
   Problem: Education: Goal: Knowledge of [facility] Education information/materials will improve Outcome: Not Progressing Goal: Emotional status will improve Outcome: Not Progressing Goal: Mental status will improve Outcome: Not Progressing Goal: Verbalization of understanding the information provided will improve Outcome: Not Progressing

## 2024-12-07 NOTE — Group Note (Signed)
 LCSW Group Therapy Note  Group Date: 12/07/2024 Start Time: 1032 End Time: 1110   Type of Therapy and Topic:  Group Therapy - Healthy vs Unhealthy Coping Skills  Participation Level:  Active   Description of Group The focus of this group was to determine what unhealthy coping techniques typically are used by group members and what healthy coping techniques would be helpful in coping with various problems. Patients were guided in becoming aware of the differences between healthy and unhealthy coping techniques. Patients were asked to identify 2-3 healthy coping skills they would like to learn to use more effectively.  Therapeutic Goals Patients learned that coping is what human beings do all day long to deal with various situations in their lives Patients defined and discussed healthy vs unhealthy coping techniques Patients identified their preferred coping techniques and identified whether these were healthy or unhealthy Patients determined 2-3 healthy coping skills they would like to become more familiar with and use more often. Patients provided support and ideas to each other   Summary of Patient Progress:  During group, Shari [name] expressed understanding of content. Patient proved open to input from peers and feedback from CSW. Patient demonstrated examples of insight into the subject matter, was respectful of peers, and participated throughout the entire session.   Therapeutic Modalities Cognitive Behavioral Therapy Motivational Interviewing  Shari [name], LCSWA 12/07/2024  12:10 PM

## 2024-12-07 NOTE — Progress Notes (Signed)
 Patient pleasant during interaction. Accepted medications without issue. Observed in dayroom for meals and group. Ongoing monitoring continues  12/07/24 1000  Psychosocial Assessment  Patient Complaints None  Eye Contact Fair  Facial Expression Flat  Affect Appropriate to circumstance  Speech Logical/coherent  Interaction Minimal  Motor Activity Slow  Appearance/Hygiene Unremarkable  Behavior Characteristics Appropriate to situation;Cooperative  Mood Pleasant  Thought Process  Coherency WDL  Content WDL  Delusions None reported or observed  Perception WDL  Hallucination None reported or observed  Judgment Impaired  Confusion Mild  Danger to Self  Current suicidal ideation? Denies  Danger to Others  Danger to Others None reported or observed

## 2024-12-07 NOTE — Group Note (Signed)
 Date:  12/07/2024 Time:  10:34 AM  Group Topic/Focus:  Coping With Mental Health Crisis:   The purpose of this group is to help patients identify strategies for coping with mental health crisis.  Group discusses possible causes of crisis and ways to manage them effectively.    Participation Level:  Did Not Attend    Shari [name] Bias 12/07/2024, 10:34 AM

## 2024-12-07 NOTE — Plan of Care (Signed)
   Problem: Education: Goal: Knowledge of Shari [name] General Education information/materials will improve Outcome: Progressing Goal: Emotional status will improve Outcome: Progressing Goal: Mental status will improve Outcome: Progressing Goal: Verbalization of understanding the information provided will improve Outcome: Progressing   Problem: Activity: Goal: Interest or engagement in activities will improve Outcome: Progressing Goal: Sleeping patterns will improve Outcome: Progressing   Problem: Coping: Goal: Ability to verbalize frustrations and anger appropriately will improve Outcome: Progressing Goal: Ability to demonstrate self-control will improve Outcome: Progressing

## 2024-12-08 DIAGNOSIS — F200 Paranoid schizophrenia: Secondary | ICD-10-CM | POA: Diagnosis not present

## 2024-12-08 DIAGNOSIS — F0390 Unspecified dementia without behavioral disturbance: Secondary | ICD-10-CM | POA: Diagnosis not present

## 2024-12-08 NOTE — Plan of Care (Signed)
  Problem: Education: Goal: Knowledge of  General Education information/materials will improve Outcome: Not Progressing Goal: Emotional status will improve Outcome: Not Progressing Goal: Mental status will improve Outcome: Not Progressing Goal: Verbalization of understanding the information provided will improve Outcome: Not Progressing   Problem: Health Behavior/Discharge Planning: Goal: Identification of resources available to assist in meeting health care needs will improve Outcome: Not Progressing Goal: Compliance with treatment plan for underlying cause of condition will improve Outcome: Not Progressing   Problem: Safety: Goal: Periods of time without injury will increase Outcome: Not Progressing

## 2024-12-08 NOTE — Group Note (Signed)
 Date:  12/08/2024 Time:  8:56 PM  Group Topic/Focus:  Wrap-Up Group:   The focus of this group is to help patients review their daily goal of treatment and discuss progress on daily workbooks.    Participation Level:  Active  Participation Quality:  Appropriate  Affect:  Appropriate  Cognitive:  Alert  Insight: Appropriate  Engagement in Group:  Engaged  Modes of Intervention:  Discussion  Additional Comments:    [name] [name] 12/08/2024, 8:56 PM

## 2024-12-08 NOTE — Group Note (Signed)
 [facility] LCSW Group Therapy Note   Group Date: 12/08/2024 Start Time: 1030 End Time: 1115   Type of Therapy/Topic:  Group Therapy:  Emotion Regulation  Participation Level:  Active   Mood: Pleasant, calm talkative  Description of Group:    The purpose of this group is to assist patients in learning to regulate negative emotions and experience positive emotions. Patients will be guided to discuss ways in which they have been vulnerable to their negative emotions. These vulnerabilities will be juxtaposed with experiences of positive emotions or situations, and patients challenged to use positive emotions to combat negative ones. Special emphasis will be placed on coping with negative emotions in conflict situations, and patients will process healthy conflict resolution skills.  Therapeutic Goals: Patient will identify two positive emotions or experiences to reflect on in order to balance out negative emotions:  Patient will label two or more emotions that they find the most difficult to experience:  Patient will be able to demonstrate positive conflict resolution skills through discussion or role plays:   Summary of Patient Progress:  The facilitator and patient discussed how to regulate negative emotions and experience positive emotions. Group members discussed their positive emotions and strategies for negative emotions.The facilitator and patient examined how different  emotions can influence their mood.  The patient reflected on how their emotions can shape their current thoughts.  The patient was receptive to feedback from both peers and the facilitator and contributed to creating a supportive environment, encouraging others to open up and share.  Therapeutic Modalities:   Cognitive Behavioral Therapy Feelings Identification Dialectical Behavioral Therapy   [name], LCSWA

## 2024-12-08 NOTE — Progress Notes (Signed)
 [facility] MD Progress Note   5:46 PM Shari [name]  MRN:  [id number] Patient is a 69 year old female with a history of Schizoaffective Disorder, Bipolar Type who presents via [employer] under IVC, initiated by CM, Falencio with re-entry program, to [facility] for assessment. Per IVC, [name] has been diagnosed with Schizophrenia and is refusing to take medication. [name] isn't sleeping. Residents report her screaming and yelling all night long.  [name] responding to internal stimuli.  [name] verbally aggressive.  The respondent has locked all of the residents inside of the transitional housing home.  They could not leave the home which prompted the IVC.  Residents did not have access to food, the restroom or anything as a result of being locked in.  [name] is currently under Adult protective services care. Patient is admitted to [facility] unit with Q15 min safety monitoring. Multidisciplinary team approach is offered. Medication management; group/milieu therapy is offered.   Subjective:  Chart reviewed, case discussed in multidisciplinary meeting, patient seen during rounds.  Patient is in the milieu engages fairly well when talked to her no aggressive behaviors no as needed medications no overt psychosis waiting for placement    Past Psychiatric History: see h&P Family History:  Family History  Problem Relation Age of Onset   Diabetes Mother    CAD Mother    Lung cancer Father    Social History:  Social History   Substance and Sexual Activity  Alcohol Use Yes   Comment: beer occasionally     Social History   Substance and Sexual Activity  Drug Use No    Social History   Socioeconomic History   Marital status: Widowed    Spouse name: Not on file   Number of children: Not on file   Years of education: Not on file   Highest education level: Not on file  Occupational History   Not on file  Tobacco Use   Smoking status: Every Day    Current packs/day: 0.50     Average packs/day: 0.5 packs/day for 40.0 years (20.0 ttl pk-yrs)    Types: Cigarettes   Smokeless tobacco: Never  Vaping Use   Vaping status: Never Used  Substance and Sexual Activity   Alcohol use: Yes    Comment: beer occasionally   Drug use: No   Sexual activity: Not Currently    Birth control/protection: Post-menopausal  Other Topics Concern   Not on file  Social History Narrative   Not on file   Social Drivers of Health   Tobacco Use: High Risk (11/02/2024)   Patient History    Smoking Tobacco Use: Every Day    Smokeless Tobacco Use: Never    Passive Exposure: Not on file  Financial Resource Strain: Not on file  Food Insecurity: No Food Insecurity (10/17/2024)   Epic    Worried About Programme Researcher, Broadcasting/film/video in the Last Year: Never true    Ran Out of Food in the Last Year: Never true  Transportation Needs: No Transportation Needs (10/17/2024)   Epic    Lack of Transportation (Medical): No    Lack of Transportation (Non-Medical): No  Recent Concern: Transportation Needs - Unmet Transportation Needs (10/14/2024)   Epic    Lack of Transportation (Medical): Yes    Lack of Transportation (Non-Medical): Yes  Physical Activity: Not on file  Stress: Not on file  Social Connections: Unknown (10/17/2024)   Social Connection and Isolation Panel    Frequency of  Communication with Friends and Family: Patient unable to answer    Frequency of Social Gatherings with Friends and Family: Patient unable to answer    Attends Religious Services: Patient unable to answer    Active Member of Clubs or Organizations: Patient declined    Attends Banker Meetings: Patient unable to answer    Marital Status: Widowed  Depression (PHQ2-9): Not on file  Alcohol Screen: Low Risk (10/17/2024)   Alcohol Screen    Last Alcohol Screening Score (AUDIT): 0  Housing: Low Risk (10/17/2024)   Epic    Unable to Pay for Housing in the Last Year: No    Number of Times Moved in the Last  Year: 0    Homeless in the Last Year: No  Utilities: Not At Risk (10/17/2024)   Epic    Threatened with loss of utilities: No  Health Literacy: Not on file   Past Medical History:  Past Medical History:  Diagnosis Date   Eye globe prosthesis    GERD (gastroesophageal reflux disease)    History of blood transfusion 1973   related to abscess burst in my stomach   Hyperlipemia    Hyperlipidemia    Hypertension    Osteoarthritis of back    Lowerback    SVD (spontaneous vaginal delivery)    x 1, baby died at 2 wks of age   Type II diabetes mellitus (HCC)     Past Surgical History:  Procedure Laterality Date   APPENDECTOMY     COLONOSCOPY     DILATATION & CURETTAGE/HYSTEROSCOPY WITH MYOSURE N/A 02/25/2015   Procedure: DILATATION & CURETTAGE/HYSTEROSCOPY WITH MYOSURE;  Surgeon: Truman Corona, MD;  Location: [facility];  Service: Gynecology;  Laterality: N/A;   DILATION AND CURETTAGE OF UTERUS     ENUCLEATION Right 03/16/2017   ENUCLEATION Right 03/16/2017   Procedure: ENUCLEATION RIGHT EYE;  Surgeon: Loyd Kathryne Palm, MD;  Location: [facility] OR;  Service: Ophthalmology;  Laterality: Right;   EYE SURGERY     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6, 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   EYE SURGERY Right ~ 1974   S/P initial eye injury; scissors stuck in my eye   LAPAROSCOPIC CHOLECYSTECTOMY     SHOULDER ARTHROSCOPY WITH ROTATOR CUFF REPAIR Left    wire stitches per patient   SHOULDER ARTHROSCOPY WITH ROTATOR CUFF REPAIR Right     Current Medications: Current Facility-Administered Medications  Medication Dose Route Frequency Provider Last Rate Last Admin   acetaminophen  (TYLENOL ) tablet 650 mg  650 mg Oral Q6H PRN Tex Drilling, NP   650 mg at 12/07/24 2126   alum & mag hydroxide-simeth (MAALOX/MYLANTA) 200-200-20 MG/5ML suspension 30 mL  30 mL Oral Q4H PRN Tex Drilling, NP       cloNIDine  (CATAPRES ) tablet 0.1 mg  0.1 mg Oral BID PRN Tex Drilling, NP       divalproex  (DEPAKOTE ) DR tablet 1,500 mg  1,500 mg  Oral QHS Jadapalle, Sree, MD   1,500 mg at 12/07/24 2126   divalproex  (DEPAKOTE ) DR tablet 500 mg  500 mg Oral q AM Jadapalle, Sree, MD   500 mg at 12/08/24 9192   losartan  (COZAAR ) tablet 25 mg  25 mg Oral Daily Nkwenti, Doris, NP   25 mg at 12/08/24 9071   magnesium  hydroxide (MILK OF MAGNESIA) suspension 30 mL  30 mL Oral Daily PRN Nkwenti, Drilling, NP       menthol  (CEPACOL) lozenge 3 mg  1 lozenge Oral PRN Bobbitt, Shalon E, NP  3 mg at 12/08/24 9065   methimazole  (TAPAZOLE ) tablet 10 mg  10 mg Oral Daily Tex Drilling, NP   10 mg at 12/08/24 9071   OLANZapine  (ZYPREXA ) injection 5 mg  5 mg Intramuscular TID PRN Tex Drilling, NP       OLANZapine  (ZYPREXA ) injection 5 mg  5 mg Intramuscular TID PRN Tex Drilling, NP       OLANZapine  (ZYPREXA ) tablet 20 mg  20 mg Oral QHS Jadapalle, Sree, MD   20 mg at 12/07/24 2127   OLANZapine  zydis (ZYPREXA ) disintegrating tablet 5 mg  5 mg Oral TID PRN Tex Drilling, NP       OLANZapine  zydis (ZYPREXA ) disintegrating tablet 5 mg  5 mg Oral TID PRN Tex Drilling, NP       paliperidone  (INVEGA ) 24 hr tablet 3 mg  3 mg Oral Daily Shrivastava, Aryendra, MD   3 mg at 12/08/24 9071   pantoprazole  (PROTONIX ) EC tablet 40 mg  40 mg Oral Daily Nkwenti, Doris, NP   40 mg at 12/08/24 9071   traZODone  (DESYREL ) tablet 50 mg  50 mg Oral QHS PRN Tex Drilling, NP   50 mg at 12/06/24 2109    Lab Results:  No results found for this or any previous visit (from the past 48 hours).       Blood Alcohol level:  Lab Results  Component Value Date   [facility] <15 10/14/2024   ETH <10 07/02/2022    Metabolic Disorder Labs: Lab Results  Component Value Date   HGBA1C 5.5 10/14/2024   MPG 111.15 10/14/2024   MPG 137 01/06/2022   Lab Results  Component Value Date   PROLACTIN 5.8 10/14/2024   Lab Results  Component Value Date   CHOL 167 10/14/2024   TRIG 68 10/14/2024   HDL 60 10/14/2024   CHOLHDL 2.8 10/14/2024   VLDL 14 10/14/2024   LDLCALC 93 10/14/2024    LDLCALC 73 03/13/2024    Physical Findings: AIMS:  , ,  ,  ,    CIWA:    COWS:      Psychiatric Specialty Exam:  Presentation  General Appearance:  Appropriate for Environment  Eye Contact: Fleeting  Speech: Normal Rate  Speech Volume: Decreased    Mood and Affect  Mood:fine  Affect: Flat   Thought Process  Thought Processes: impoverished  Orientation:Partial  Thought Content: Chronic delusions Hallucinations: Denies  Ideas of Reference:Delusions; Paranoia  Suicidal Thoughts: Denies  Homicidal Thoughts: Denies   Sensorium  Memory: Immediate Fair; Recent Fair  Judgment: Impaired  Insight: Shallow   Executive Functions  Concentration: Fair  Attention Span: Fair  Recall: Poor  Fund of Knowledge: Fair  Language: Fair   Psychomotor Activity  Psychomotor Activity: No data recorded  Musculoskeletal: Strength & Muscle Tone: within normal limits Gait & Station: normal Assets  Assets: Manufacturing Systems Engineer; Desire for Improvement; Social Support    Physical Exam: Physical Exam Vitals and nursing note reviewed.    ROS Blood pressure (!) 152/69, pulse 98, temperature 98.3 F (36.8 C), resp. rate 18, height 5' 8 (1.727 m), weight 48.3 kg, SpO2 100%. Body mass index is 16.19 kg/m.  Diagnosis: Principal Problem:   Schizophrenia, paranoid (HCC) Major neurocognitive disorder-slums total score of 13/30   Treatment Plan Summary: APS referral has been made as patient lacks capacity to make medical decisions.  Recommending legal guardianship and further placement at appropriate level of care  Safety and Monitoring:             --  Involuntary admission to inpatient psychiatric unit for safety, stabilization and treatment             -- Daily contact with patient to assess and evaluate symptoms and progress in treatment             -- Patient's case to be discussed in multi-disciplinary team meeting             -- Observation  Level: q15 minute checks             -- Vital signs:  q12 hours             -- Precautions: suicide, elopement, and assault   2. Psychiatric Diagnoses and Treatment:  Check Depakote  levels 11/20/2024-60           Depakote  changed to 500 mg every morning and 1500 mg nightly-in next 5 days 11/13/2024 Depakote  level-44 Zyprexa  20 mg nightly BMP-within normal limits Occupational Therapy evaluation for assessing appropriate level of care -- The risks/benefits/side-effects/alternatives to this medication were discussed in detail with the patient and time was given for questions. The patient consents to medication trial.                -- Metabolic profile and EKG monitoring obtained while on an atypical antipsychotic (BMI: Lipid Panel: HbgA1c: QTc:)              -- Encouraged patient to participate in unit milieu and in scheduled group therapies   Occupational Therapy recommendations If plan is discharge home, recommend the following:   Direct supervision/assist for medications management;Direct supervision/assist for financial management;Assist for transportation;Assistance with cooking/housework    Ms Lewter was seen for OT treatment on this date. Upon arrival to room pt in dayroom, agreeable to tx. The SLUMS is a 30-point screening questionnaire that tests orientation, memory, attention, problem solving, and executive function. Pt scored a 13/30 indicating Dementia. Of note, it is not within occupational therapy scope of practice to diagnose cognitive impairments, this screen indicates need for further testing. Pt with noted impairments in memory and problem solving limiting ability to participate functionally in medication management, bill management, and safety cooking. Will continue to follow POC. Continue to recommend SUPERVISION for safety with iADLs.    [facility] Mental Status Examination Orientation: 2/3  Calculations: 0/3 Naming animals: 2/3 Patient named 14 animals (0  points is 0-4 animals; 1 is 5-9 animals; 2 is 10-14 animals; 3 is 15+ animals) Recall: 3/5  Attention: 1/2 Clock drawing: 1/4 Visual Processing: 2/2 Paragraph Memory: 2/8  Total: 13/30;  Given that patient has completed high school, this score falls in the dementia range.   4. Discharge Planning:   -- Social work and case management to assist with discharge planning and identification of hospital follow-up needs prior to discharge  -- Estimated LOS: 3-4 days  Millie JONELLE Manners, MD 12/08/2024, 5:46 PM

## 2024-12-08 NOTE — Group Note (Signed)
 Date:  12/08/2024 Time:  2:15 PM  Group Topic/Focus:  Goals Group:   The focus of this group is to help patients establish daily goals to achieve during treatment and discuss how the patient can incorporate goal setting into their daily lives to aide in recovery.    Participation Level:  Minimal  Participation Quality:  Appropriate  Affect:  Appropriate  Cognitive:  Alert  Insight: Appropriate  Engagement in Group:  Engaged  Modes of Intervention:  Discussion  Additional Comments:     Maglione,Sarabeth Benton E 12/08/2024, 2:15 PM

## 2024-12-08 NOTE — Progress Notes (Signed)
 Patient pleasant and responsive during interaction. Medications as prescribed with PRN cough drop X1. Reported fluttering denies chest pain. EKG completed. No new orders at this time. Ongoing monitoring continue. Denies SI/HI/AVH.  12/08/24 0900  Psych Admission Type (Psych Patients Only)  Admission Status Involuntary  Psychosocial Assessment  Patient Complaints None  Eye Contact Fair  Facial Expression Flat  Affect Appropriate to circumstance  Speech Slow;Soft  Interaction Minimal  Motor Activity Slow  Appearance/Hygiene Unremarkable  Behavior Characteristics Appropriate to situation;Cooperative  Mood Pleasant  Thought Process  Coherency WDL  Content WDL  Delusions None reported or observed  Perception WDL  Hallucination None reported or observed  Judgment Impaired  Confusion Mild  Danger to Self  Current suicidal ideation? Denies

## 2024-12-08 NOTE — Progress Notes (Signed)
 [facility] MD Progress Note  12/08/2024 2:27 PM Shari [name]  MRN:  [id number] Patient is a 69 year old female with a history of Schizoaffective Disorder, Bipolar Type who presents via [employer] under IVC, initiated by CM, Falencio with re-entry program, to [facility] for assessment. Per IVC, [name] has been diagnosed with Schizophrenia and is refusing to take medication. [name] isn't sleeping. Residents report her screaming and yelling all night long.  [name] responding to internal stimuli.  [name] verbally aggressive.  The respondent has locked all of the residents inside of the transitional housing home.  They could not leave the home which prompted the IVC.  Residents did not have access to food, the restroom or anything as a result of being locked in.  [name] is currently under Adult protective services care. Patient is admitted to [facility] unit with Q15 min safety monitoring. Multidisciplinary team approach is offered. Medication management; group/milieu therapy is offered.   Subjective:  Chart reviewed, case discussed in multidisciplinary meeting, patient seen during rounds.   Per nursing report patient is doing fine.  No concerns reported no agitation or aggression noted.  Patient was seen in her room still talking about Rutha Molt.  Patient does not want to give any details about that.  Patient continues to report of hearing voices.  The patient continues to believe that it was her house that other people were staying in and was not a group home.  Patient was adamant that she wants to go back to that house only.  APS in the process of finding a placement.  Patient slept for 7.5 hours.  Patient reports that she is taking the medication while she is in the hospital but will stop the medication when she go home.  Given significant delusion we will start patient on additional Invega  and taper it up to address ongoing hallucinations and delusions.  And potentially switching to LAI  if possible.      Past Psychiatric History: see h&P Family History:  Family History  Problem Relation Age of Onset   Diabetes Mother    CAD Mother    Lung cancer Father    Social History:  Social History   Substance and Sexual Activity  Alcohol Use Yes   Comment: beer occasionally     Social History   Substance and Sexual Activity  Drug Use No    Social History   Socioeconomic History   Marital status: Widowed    Spouse name: Not on file   Number of children: Not on file   Years of education: Not on file   Highest education level: Not on file  Occupational History   Not on file  Tobacco Use   Smoking status: Every Day    Current packs/day: 0.50    Average packs/day: 0.5 packs/day for 40.0 years (20.0 ttl pk-yrs)    Types: Cigarettes   Smokeless tobacco: Never  Vaping Use   Vaping status: Never Used  Substance and Sexual Activity   Alcohol use: Yes    Comment: beer occasionally   Drug use: No   Sexual activity: Not Currently    Birth control/protection: Post-menopausal  Other Topics Concern   Not on file  Social History Narrative   Not on file   Social Drivers of Health   Tobacco Use: High Risk (11/02/2024)   Patient History    Smoking Tobacco Use: Every Day    Smokeless Tobacco Use: Never    Passive Exposure: Not  on file  Financial Resource Strain: Not on file  Food Insecurity: No Food Insecurity (10/17/2024)   Epic    Worried About Programme Researcher, Broadcasting/film/video in the Last Year: Never true    Ran Out of Food in the Last Year: Never true  Transportation Needs: No Transportation Needs (10/17/2024)   Epic    Lack of Transportation (Medical): No    Lack of Transportation (Non-Medical): No  Recent Concern: Transportation Needs - Unmet Transportation Needs (10/14/2024)   Epic    Lack of Transportation (Medical): Yes    Lack of Transportation (Non-Medical): Yes  Physical Activity: Not on file  Stress: Not on file  Social Connections: Unknown (10/17/2024)    Social Connection and Isolation Panel    Frequency of Communication with Friends and Family: Patient unable to answer    Frequency of Social Gatherings with Friends and Family: Patient unable to answer    Attends Religious Services: Patient unable to answer    Active Member of Clubs or Organizations: Patient declined    Attends Banker Meetings: Patient unable to answer    Marital Status: Widowed  Depression (PHQ2-9): Not on file  Alcohol Screen: Low Risk (10/17/2024)   Alcohol Screen    Last Alcohol Screening Score (AUDIT): 0  Housing: Low Risk (10/17/2024)   Epic    Unable to Pay for Housing in the Last Year: No    Number of Times Moved in the Last Year: 0    Homeless in the Last Year: No  Utilities: Not At Risk (10/17/2024)   Epic    Threatened with loss of utilities: No  Health Literacy: Not on file   Past Medical History:  Past Medical History:  Diagnosis Date   Eye globe prosthesis    GERD (gastroesophageal reflux disease)    History of blood transfusion 1973   related to abscess burst in my stomach   Hyperlipemia    Hyperlipidemia    Hypertension    Osteoarthritis of back    Lowerback    SVD (spontaneous vaginal delivery)    x 1, baby died at 2 wks of age   Type II diabetes mellitus (HCC)     Past Surgical History:  Procedure Laterality Date   APPENDECTOMY     COLONOSCOPY     DILATATION & CURETTAGE/HYSTEROSCOPY WITH MYOSURE N/A 02/25/2015   Procedure: DILATATION & CURETTAGE/HYSTEROSCOPY WITH MYOSURE;  Surgeon: Truman Corona, MD;  Location: [facility];  Service: Gynecology;  Laterality: N/A;   DILATION AND CURETTAGE OF UTERUS     ENUCLEATION Right 03/16/2017   ENUCLEATION Right 03/16/2017   Procedure: ENUCLEATION RIGHT EYE;  Surgeon: Loyd Kathryne Palm, MD;  Location: [facility] OR;  Service: Ophthalmology;  Laterality: Right;   EYE SURGERY     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6, 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   EYE SURGERY Right ~ 1974   S/P initial eye injury; scissors stuck in  my eye   LAPAROSCOPIC CHOLECYSTECTOMY     SHOULDER ARTHROSCOPY WITH ROTATOR CUFF REPAIR Left    wire stitches per patient   SHOULDER ARTHROSCOPY WITH ROTATOR CUFF REPAIR Right     Current Medications: Current Facility-Administered Medications  Medication Dose Route Frequency Provider Last Rate Last Admin   acetaminophen  (TYLENOL ) tablet 650 mg  650 mg Oral Q6H PRN Tex Drilling, NP   650 mg at 12/07/24 2126   alum & mag hydroxide-simeth (MAALOX/MYLANTA) 200-200-20 MG/5ML suspension 30 mL  30 mL Oral Q4H PRN Tex Drilling, NP  cloNIDine  (CATAPRES ) tablet 0.1 mg  0.1 mg Oral BID PRN Tex Drilling, NP       divalproex  (DEPAKOTE ) DR tablet 1,500 mg  1,500 mg Oral QHS Jadapalle, Sree, MD   1,500 mg at 12/07/24 2126   divalproex  (DEPAKOTE ) DR tablet 500 mg  500 mg Oral q AM Jadapalle, Sree, MD   500 mg at 12/08/24 9192   losartan  (COZAAR ) tablet 25 mg  25 mg Oral Daily Nkwenti, Doris, NP   25 mg at 12/08/24 9071   magnesium  hydroxide (MILK OF MAGNESIA) suspension 30 mL  30 mL Oral Daily PRN Tex Drilling, NP       menthol  (CEPACOL) lozenge 3 mg  1 lozenge Oral PRN Bobbitt, Shalon E, NP   3 mg at 12/08/24 9065   methimazole  (TAPAZOLE ) tablet 10 mg  10 mg Oral Daily Nkwenti, Doris, NP   10 mg at 12/08/24 9071   OLANZapine  (ZYPREXA ) injection 5 mg  5 mg Intramuscular TID PRN Tex Drilling, NP       OLANZapine  (ZYPREXA ) injection 5 mg  5 mg Intramuscular TID PRN Tex Drilling, NP       OLANZapine  (ZYPREXA ) tablet 20 mg  20 mg Oral QHS Jadapalle, Sree, MD   20 mg at 12/07/24 2127   OLANZapine  zydis (ZYPREXA ) disintegrating tablet 5 mg  5 mg Oral TID PRN Tex Drilling, NP       OLANZapine  zydis (ZYPREXA ) disintegrating tablet 5 mg  5 mg Oral TID PRN Tex Drilling, NP       paliperidone  (INVEGA ) 24 hr tablet 3 mg  3 mg Oral Daily Shrivastava, Aryendra, MD   3 mg at 12/08/24 9071   pantoprazole  (PROTONIX ) EC tablet 40 mg  40 mg Oral Daily Nkwenti, Doris, NP   40 mg at 12/08/24 9071    traZODone  (DESYREL ) tablet 50 mg  50 mg Oral QHS PRN Tex Drilling, NP   50 mg at 12/06/24 2109    Lab Results:  No results found for this or any previous visit (from the past 48 hours).       Blood Alcohol level:  Lab Results  Component Value Date   [facility] <15 10/14/2024   ETH <10 07/02/2022    Metabolic Disorder Labs: Lab Results  Component Value Date   HGBA1C 5.5 10/14/2024   MPG 111.15 10/14/2024   MPG 137 01/06/2022   Lab Results  Component Value Date   PROLACTIN 5.8 10/14/2024   Lab Results  Component Value Date   CHOL 167 10/14/2024   TRIG 68 10/14/2024   HDL 60 10/14/2024   CHOLHDL 2.8 10/14/2024   VLDL 14 10/14/2024   LDLCALC 93 10/14/2024   LDLCALC 73 03/13/2024    Physical Findings: AIMS:  , ,  ,  ,    CIWA:    COWS:      Psychiatric Specialty Exam:  Presentation  General Appearance:  Appropriate for Environment  Eye Contact: Fleeting  Speech: Normal Rate  Speech Volume: Decreased    Mood and Affect  Mood:fine  Affect: Flat   Thought Process  Thought Processes: impoverished  Orientation:Partial  Thought Content: Chronic delusions Hallucinations: Denies  Ideas of Reference:Delusions; Paranoia  Suicidal Thoughts: Denies  Homicidal Thoughts: Denies   Sensorium  Memory: Immediate Fair; Recent Fair  Judgment: Impaired  Insight: Shallow   Executive Functions  Concentration: Fair  Attention Span: Fair  Recall: Poor  Fund of Knowledge: Fair  Language: Fair   Psychomotor Activity  Psychomotor Activity: No data recorded  Musculoskeletal: Strength & Muscle Tone: within normal limits Gait & Station: normal Assets  Assets: Manufacturing Systems Engineer; Desire for Improvement; Social Support    Physical Exam: Physical Exam Vitals and nursing note reviewed.    ROS Blood pressure 113/65, pulse 71, temperature 98.3 F (36.8 C), resp. rate 18, height 5' 8 (1.727 m), weight 48.3 kg, SpO2 99%. Body  mass index is 16.19 kg/m.  Diagnosis: Principal Problem:   Schizophrenia, paranoid (HCC) Major neurocognitive disorder-slums total score of 13/30   Treatment Plan Summary: APS referral has been made as patient lacks capacity to make medical decisions.  Recommending legal guardianship and further placement at appropriate level of care  Safety and Monitoring:             -- Involuntary admission to inpatient psychiatric unit for safety, stabilization and treatment             -- Daily contact with patient to assess and evaluate symptoms and progress in treatment             -- Patient's case to be discussed in multi-disciplinary team meeting             -- Observation Level: q15 minute checks             -- Vital signs:  q12 hours             -- Precautions: suicide, elopement, and assault   2. Psychiatric Diagnoses and Treatment:  Check Depakote  levels 11/20/2024-60           Depakote  changed to 500 mg every morning and 1500 mg nightly-in next 5 days 11/13/2024 Depakote  level-44 Zyprexa  20 mg nightly Given significant delusion we will start patient on additional Invega  and taper it up to address ongoing hallucinations and delusions.  And potentially switching to LAI if possible. Added 1nvega 3 mg daily.  BMP-within normal limits Occupational Therapy evaluation for assessing appropriate level of care -- The risks/benefits/side-effects/alternatives to this medication were discussed in detail with the patient and time was given for questions. The patient consents to medication trial.                -- Metabolic profile and EKG monitoring obtained while on an atypical antipsychotic (BMI: Lipid Panel: HbgA1c: QTc:)              -- Encouraged patient to participate in unit milieu and in scheduled group therapies   Occupational Therapy recommendations If plan is discharge home, recommend the following:   Direct supervision/assist for medications management;Direct supervision/assist for  financial management;Assist for transportation;Assistance with cooking/housework    Ms Calma was seen for OT treatment on this date. Upon arrival to room pt in dayroom, agreeable to tx. The SLUMS is a 30-point screening questionnaire that tests orientation, memory, attention, problem solving, and executive function. Pt scored a 13/30 indicating Dementia. Of note, it is not within occupational therapy scope of practice to diagnose cognitive impairments, this screen indicates need for further testing. Pt with noted impairments in memory and problem solving limiting ability to participate functionally in medication management, bill management, and safety cooking. Will continue to follow POC. Continue to recommend SUPERVISION for safety with iADLs.    [facility] Mental Status Examination Orientation: 2/3  Calculations: 0/3 Naming animals: 2/3 Patient named 14 animals (0 points is 0-4 animals; 1 is 5-9 animals; 2 is 10-14 animals; 3 is 15+ animals) Recall: 3/5  Attention: 1/2 Clock drawing: 1/4 Visual Processing: 2/2 Paragraph Memory: 2/8  Total: 13/30;  Given that patient has completed high school, this score falls in the dementia range.   4. Discharge Planning:   -- Social work and case management to assist with discharge planning and identification of hospital follow-up needs prior to discharge  -- Estimated LOS: 3-4 days  Shari JONELLE Manners, MD 12/08/2024, 2:27 PM

## 2024-12-08 NOTE — Progress Notes (Signed)
" °   12/07/24 1937  Psych Admission Type (Psych Patients Only)  Admission Status Involuntary  Psychosocial Assessment  Patient Complaints None  Eye Contact Fair  Facial Expression Flat  Affect Appropriate to circumstance;Flat  Speech Soft;Logical/coherent  Interaction Minimal  Motor Activity Slow  Appearance/Hygiene Unremarkable  Behavior Characteristics Appropriate to situation;Cooperative;Calm  Mood Pleasant  Aggressive Behavior  Effect No apparent injury  Thought Process  Coherency WDL  Content WDL  Delusions None reported or observed  Perception WDL  Hallucination None reported or observed  Judgment Impaired  Confusion Mild  Danger to Self  Current suicidal ideation? Denies  Agreement Not to Harm Self Yes  Description of Agreement Verbal  Danger to Others  Danger to Others None reported or observed    "

## 2024-12-08 NOTE — Group Note (Signed)
 Date:  12/08/2024 Time:  3:52 PM  Group Topic/Focus:  Wellness Toolbox:   The focus of this group is to discuss various aspects of wellness, balancing those aspects and exploring ways to increase the ability to experience wellness.  Patients will create a wellness toolbox for use upon discharge.    Participation Level:  Did Not Attend   Larrie Leita BRAVO 12/08/2024, 3:52 PM

## 2024-12-08 NOTE — Plan of Care (Signed)
  Problem: Activity: Goal: Interest or engagement in activities will improve Outcome: Progressing Goal: Sleeping patterns will improve Outcome: Progressing   Problem: Physical Regulation: Goal: Ability to maintain clinical measurements within normal limits will improve Outcome: Progressing   Problem: Safety: Goal: Periods of time without injury will increase Outcome: Progressing

## 2024-12-09 DIAGNOSIS — F200 Paranoid schizophrenia: Secondary | ICD-10-CM | POA: Diagnosis not present

## 2024-12-09 DIAGNOSIS — F0390 Unspecified dementia without behavioral disturbance: Secondary | ICD-10-CM | POA: Diagnosis not present

## 2024-12-09 MED ORDER — FAMOTIDINE 40 MG/5ML PO SUSR
40.0000 mg | Freq: Every day | ORAL | Status: DC
  Administered 2024-12-10 – 2024-12-25 (×16): 40 mg via ORAL
  Filled 2024-12-09 (×16): qty 5

## 2024-12-09 NOTE — Group Note (Signed)
 Date:  12/09/2024 Time:  8:57 PM  Group Topic/Focus:  Wrap-Up Group:   The focus of this group is to help patients review their daily goal of treatment and discuss progress on daily workbooks.    Participation Level:  Active  Participation Quality:  Appropriate  Affect:  Appropriate  Cognitive:  Alert  Insight: Appropriate  Engagement in Group:  Engaged  Modes of Intervention:  Discussion  Additional Comments:    Shari [name] 12/09/2024, 8:57 PM

## 2024-12-09 NOTE — Group Note (Signed)
 Recreation Therapy Group Note   Group Topic:Stress Management  Group Date: 12/09/2024 Start Time: 1415 End Time: 1440 Facilitators: Celestia Jeoffrey BRAVO, LRT, CTRS Location: Dayroom  Group Description: Meditation. LRT asks patients their current level of stress/anxiety from 1-10, with 10 being the highest. LRT educated on the benefits of meditation and relaxation techniques, and how it can apply to everyday life post-discharge. LRT and pt's followed along to an audio script of a guided meditation video. LRT asked pt their level of stress and anxiety once the prompt was finished. LRT facilitated post-activity processing to gain feedback on session.   Goal Area(s) Addressed:  Patient will practice using relaxation technique. Patient will identify a new coping skill.  Patient will follow multistep directions to reduce anxiety and stress.   Affect/Mood: Appropriate and Flat   Participation Level: Active and Engaged   Participation Quality: Independent   Behavior: Calm and Cooperative   Speech/Thought Process: Coherent   Insight: Fair   Judgement: Fair    Modes of Intervention: Clarification, Education, and Exploration   Patient Response to Interventions:  Receptive   Education Outcome:  In group clarification offered    Clinical Observations/Individualized Feedback: Chellie was active in their participation of session activities and group discussion. Pt completed all exercisers as prompted. Pt interacted well with LRT and peers duration of session.    Plan: Continue to engage patient in RT group sessions 2-3x/week.   Jeoffrey BRAVO Celestia, LRT, CTRS 12/09/2024 4:50 PM

## 2024-12-09 NOTE — Plan of Care (Signed)

## 2024-12-09 NOTE — BH IP Treatment Plan (Signed)
 Interdisciplinary Treatment and Diagnostic Plan Update  12/09/2024 Time of Session: 2:30 PM  Shari [name] MRN: [id number]  Principal Diagnosis: Schizophrenia, paranoid (HCC)  Secondary Diagnoses: Principal Problem:   Schizophrenia, paranoid (HCC)   Current Medications:  Current Facility-Administered Medications  Medication Dose Route Frequency Provider Last Rate Last Admin   acetaminophen  (TYLENOL ) tablet 650 mg  650 mg Oral Q6H PRN Tex Drilling, NP   650 mg at 12/07/24 2126   alum & mag hydroxide-simeth (MAALOX/MYLANTA) 200-200-20 MG/5ML suspension 30 mL  30 mL Oral Q4H PRN Tex Drilling, NP       cloNIDine  (CATAPRES ) tablet 0.1 mg  0.1 mg Oral BID PRN Tex Drilling, NP       divalproex  (DEPAKOTE ) DR tablet 1,500 mg  1,500 mg Oral QHS Jadapalle, Sree, MD   1,500 mg at 12/08/24 2116   divalproex  (DEPAKOTE ) DR tablet 500 mg  500 mg Oral q AM Jadapalle, Sree, MD   500 mg at 12/09/24 9147   losartan  (COZAAR ) tablet 25 mg  25 mg Oral Daily Nkwenti, Doris, NP   25 mg at 12/09/24 0853   magnesium  hydroxide (MILK OF MAGNESIA) suspension 30 mL  30 mL Oral Daily PRN Tex Drilling, NP       menthol  (CEPACOL) lozenge 3 mg  1 lozenge Oral PRN Bobbitt, Shalon E, NP   3 mg at 12/08/24 0934   methimazole  (TAPAZOLE ) tablet 10 mg  10 mg Oral Daily Nkwenti, Doris, NP   10 mg at 12/09/24 9147   OLANZapine  (ZYPREXA ) injection 5 mg  5 mg Intramuscular TID PRN Tex Drilling, NP       OLANZapine  (ZYPREXA ) injection 5 mg  5 mg Intramuscular TID PRN Tex Drilling, NP       OLANZapine  (ZYPREXA ) tablet 20 mg  20 mg Oral QHS Jadapalle, Sree, MD   20 mg at 12/08/24 2116   OLANZapine  zydis (ZYPREXA ) disintegrating tablet 5 mg  5 mg Oral TID PRN Tex Drilling, NP       OLANZapine  zydis (ZYPREXA ) disintegrating tablet 5 mg  5 mg Oral TID PRN Tex Drilling, NP       paliperidone  (INVEGA ) 24 hr tablet 3 mg  3 mg Oral Daily Shrivastava, Aryendra, MD   3 mg at 12/09/24 9147   pantoprazole  (PROTONIX ) EC tablet 40  mg  40 mg Oral Daily Nkwenti, Doris, NP   40 mg at 12/09/24 9146   traZODone  (DESYREL ) tablet 50 mg  50 mg Oral QHS PRN Tex Drilling, NP   50 mg at 12/08/24 2116   PTA Medications: Medications Prior to Admission  Medication Sig Dispense Refill Last Dose/Taking   Ascorbic Acid (VITAMIN C PO) Take 1 tablet by mouth daily. (Patient not taking: Reported on 10/14/2024)      divalproex  (DEPAKOTE ) 500 MG DR tablet Take 1 tablet (500 mg total) by mouth at bedtime. (Patient not taking: Reported on 10/14/2024) 30 tablet 1    losartan  (COZAAR ) 25 MG tablet Take 1 tablet (25 mg total) by mouth daily. (Patient not taking: Reported on 10/14/2024) 90 tablet 1    methimazole  (TAPAZOLE ) 10 MG tablet Take 1 tablet (10 mg total) by mouth daily. (Patient not taking: Reported on 10/14/2024) 90 tablet 1    OLANZapine  (ZYPREXA ) 10 MG tablet Take 1 tablet (10 mg total) by mouth at bedtime. (Patient not taking: Reported on 10/14/2024) 30 tablet 1    pantoprazole  (PROTONIX ) 40 MG tablet Take 1 tablet (40 mg total) by mouth daily. (Patient not taking: Reported on  10/14/2024) 90 tablet 0    propranolol  (INDERAL ) 10 MG tablet Take 1 tablet (10 mg total) by mouth 2 (two) times daily. (Patient not taking: Reported on 10/14/2024) 180 tablet 1    VITAMIN D PO Take 1 tablet by mouth daily. (Patient not taking: Reported on 10/14/2024)      VITAMIN E PO Take 1 tablet by mouth daily. (Patient not taking: Reported on 10/14/2024)       Patient Stressors: Medication change or noncompliance    Patient Strengths: Communication skills   Treatment Modalities: Medication Management, Group therapy, Case management,  1 to 1 session with clinician, Psychoeducation, Recreational therapy.   Physician Treatment Plan for Primary Diagnosis: Schizophrenia, paranoid (HCC) Long Term Goal(s): Improvement in symptoms so as ready for discharge   Short Term Goals: Ability to identify changes in lifestyle to reduce recurrence of condition will  improve Ability to verbalize feelings will improve Ability to disclose and discuss suicidal ideas Ability to demonstrate self-control will improve Ability to identify and develop effective coping behaviors will improve Ability to maintain clinical measurements within normal limits will improve  Medication Management: Evaluate patient's response, side effects, and tolerance of medication regimen.  Therapeutic Interventions: 1 to 1 sessions, Unit Group sessions and Medication administration.  Evaluation of Outcomes: Progressing  Physician Treatment Plan for Secondary Diagnosis: Principal Problem:   Schizophrenia, paranoid (HCC)  Long Term Goal(s): Improvement in symptoms so as ready for discharge   Short Term Goals: Ability to identify changes in lifestyle to reduce recurrence of condition will improve Ability to verbalize feelings will improve Ability to disclose and discuss suicidal ideas Ability to demonstrate self-control will improve Ability to identify and develop effective coping behaviors will improve Ability to maintain clinical measurements within normal limits will improve     Medication Management: Evaluate patient's response, side effects, and tolerance of medication regimen.  Therapeutic Interventions: 1 to 1 sessions, Unit Group sessions and Medication administration.  Evaluation of Outcomes: Progressing   RN Treatment Plan for Primary Diagnosis: Schizophrenia, paranoid (HCC) Long Term Goal(s): Knowledge of disease and therapeutic regimen to maintain health will improve  Short Term Goals: Ability to remain free from injury will improve, Ability to verbalize frustration and anger appropriately will improve, Ability to demonstrate self-control, Ability to participate in decision making will improve, Ability to verbalize feelings will improve, Ability to disclose and discuss suicidal ideas, Ability to identify and develop effective coping behaviors will improve, and  Compliance with prescribed medications will improve  Medication Management: RN will administer medications as ordered by provider, will assess and evaluate patient's response and provide education to patient for prescribed medication. RN will report any adverse and/or side effects to prescribing provider.  Therapeutic Interventions: 1 on 1 counseling sessions, Psychoeducation, Medication administration, Evaluate responses to treatment, Monitor vital signs and CBGs as ordered, Perform/monitor CIWA, COWS, AIMS and Fall Risk screenings as ordered, Perform wound care treatments as ordered.  Evaluation of Outcomes: Progressing   LCSW Treatment Plan for Primary Diagnosis: Schizophrenia, paranoid (HCC) Long Term Goal(s): Safe transition to appropriate next level of care at discharge, Engage patient in therapeutic group addressing interpersonal concerns.  Short Term Goals: Engage patient in aftercare planning with referrals and resources, Increase social support, Increase ability to appropriately verbalize feelings, Increase emotional regulation, Facilitate acceptance of mental health diagnosis and concerns, Facilitate patient progression through stages of change regarding substance use diagnoses and concerns, Identify triggers associated with mental health/substance abuse issues, and Increase skills for wellness and recovery  Therapeutic Interventions: Assess for all discharge needs, 1 to 1 time with Child psychotherapist, Explore available resources and support systems, Assess for adequacy in community support network, Educate family and significant other(s) on suicide prevention, Complete Psychosocial Assessment, Interpersonal group therapy.  Evaluation of Outcomes: Progressing   Progress in Treatment: Attending groups: Yes. and No. Participating in groups: Yes. and No. Taking medication as prescribed: Yes. Toleration medication: Yes. Family/Significant other contact made: No, will contact:  once  permission has been granted  Patient understands diagnosis: No. Discussing patient identified problems/goals with staff: Yes. Medical problems stabilized or resolved: Yes. Denies suicidal/homicidal ideation: Yes. Issues/concerns per patient self-inventory: No. Other: none   New problem(s) identified: No, Describe:  None identified 10/23/24 Update: No changes at this time. 10/28/24 Update: No changes at this time. Update 11/03/24: No changes at this time Update 11/09/24: No changes at this time Update 11/14/24: No changes at this time    11/24/24 Update: No changes at this time.  Update 11/29/2024: No changes at this time.   Update 12/04/2024: No changes at this time.  Update 12/09/2024: No changes at this time.     New Short Term/Long Term Goal(s): elimination of symptoms of psychosis, medication management for mood stabilization; elimination of SI thoughts; development of comprehensive mental wellness plan. 10/23/24 Update: No changes at this time. 10/28/24 Update: No changes at this time. Update 11/03/24: No changes at this time Update 11/09/24: No changes at this time Update 11/14/24: No changes at this time. 11/24/24 Update: No changes at this time.  Update 11/29/2024: No changes at this time.   Update 12/04/2024: No changes at this time. Update 12/09/2024: No changes at this time.      Patient Goals:  I haven't set any goals for the hospital yet. I don't need psychiatric help 10/23/24 Update: No changes at this time. 10/28/24 Update: No changes at this time. Update 11/03/24: No changes at this time Update 11/09/24: No changes at this time Update 11/14/24: No changes at this time2/21/25 Update: No changes at this time. Update 11/29/2024: No changes at this time.   Update 12/04/2024: No changes at this time. Update 12/09/2024: No changes at this time.     Discharge Plan or Barriers: CSW will assist with appropriate discharge plan 10/23/24 Update: No changes at this time. 10/28/24 Update: CSW to  continue to meet with DSS to engage in safe discharge planning and connect patient with appropriate resources. Update 11/03/24: No changes at this time Update 11/09/24: No changes at this time Update 11/14/24: APS drafting a petition for guardianship, awaiting updates regarding scheduled court date at this time. APS has received FL2 to begin placement search. 11/24/24 Update: No changes at this time.  Update 11/29/2024: Placement continues to be sought at this time.  Update 12/04/2024: No updates on placement search at this time Update 12/09/2024: No changes at this time.     Reason for Continuation of Hospitalization: Delusions  Medication stabilization 10/28/24 Update: Delusions  Medication stabilization 11/24/24 Update: No changes at this time. Update 11/29/2024: No changes at this time.  Update 12/04/2024: No changes at this time.       Estimated Length of Stay: 1 to 7 days 10/23/24 Update: No changes at this time. 10/28/24 Update: TBD. Update 11/03/24: TBD Update 11/09/24: TBD Update 11/14/24: TBD 11/24/24 Update: TBD Update 11/29/2024: TBD Update 12/04/2024: TBD Update 12/09/2024: TBD  Last 3 [place] Suicide Severity Risk Score: Flowsheet Row Admission (Current) from 10/17/2024 in [facility] from  10/14/2024 in [facility] to Hosp-Admission (Discharged) from 03/13/2024 in [facility]  C-SSRS RISK CATEGORY No Risk No Risk No Risk    Last PHQ 2/9 Scores:     No data to display          Scribe for Treatment Team: [name], [place] 12/09/2024 4:00 PM

## 2024-12-09 NOTE — Group Note (Signed)
 Physical/Occupational Therapy Group Note  Group Topic: Functional, Dynamic Balance   Group Date: 12/09/2024 Start Time: 1300 End Time: 1330 Facilitators: Tessah Patchen, Alm Hamilton, PT   Group Description: Group discussed impact of balance on safety and independence with functional tasks.  Identified and discussed any self-perceived balance deficits to personalize information.  Discussed and reviewed strategies to address/improve balance deficits: use of assist devices, activity pacing/energy conservation, environment/home safety modifications, focusing attention/minimizing distraction.  Reviewed and participated with standing LE therex designed to target dynamic balance reactions and LE strength/stability; provided handouts with HEP to be utilized outside of group time as appropriate.  Allowed time for questions and further discussion on any balance or mobility concerns/needs.  Therapeutic Goal(s):  Identify and discuss any individual balance deficits and functional implications. Identify and discuss any environmental/home safety modifications that can optimize balance and safety for mobility within the home. Demonstrate understanding and performance of standing therex designed to target dynamic balance deficits.  Individual Participation: Pt actively participated with the discussion and physical activity components of the session.  Pt was steady with standing activities throughout the session with only min cues for modifying activities as needed for improved safety.  Participation Level: Active and Engaged   Participation Quality: Minimal Cues   Behavior: Appropriate   Speech/Thought Process: Focused   Affect/Mood: Flat   Insight: Moderate   Judgement: Moderate   Modes of Intervention: Activity, Discussion, and Education  Patient Response to Interventions:  Attentive and Receptive   Plan: Continue to engage patient in PT/OT groups 1 - 2x/week.  CHARM Hamilton Bertin PT, DPT 12/09/2024, 1:51  PM

## 2024-12-09 NOTE — Progress Notes (Signed)
 [facility] MD Progress Note   5:46 PM Shari [name]  MRN:  [id number] Patient is a 69 year old female with a history of Schizoaffective Disorder, Bipolar Type who presents via [employer] under IVC, initiated by CM, Falencio with re-entry program, to [facility] for assessment. Per IVC, [name] has been diagnosed with Schizophrenia and is refusing to take medication. [name] isn't sleeping. Residents report her screaming and yelling all night long.  [name] responding to internal stimuli.  [name] verbally aggressive.  The respondent has locked all of the residents inside of the transitional housing home.  They could not leave the home which prompted the IVC.  Residents did not have access to food, the restroom or anything as a result of being locked in.  [name] is currently under Adult protective services care. Patient is admitted to [facility] unit with Q15 min safety monitoring. Multidisciplinary team approach is offered. Medication management; group/milieu therapy is offered.   Subjective:  Chart reviewed, case discussed in multidisciplinary meeting, patient seen during rounds.  Patient is noted to be having her breakfast today.  She remains frustrated about her prolonged hospitalization that remains discharge focused.  Provider explained to her multiple times about DSS and legal guardianship but patient consistently declines needing a legal guardian and demands to be discharged back to her house.  [name] taking her medications with no reported side effects.  She does report of abdominal discomfort/chest discomfort.  EKG did not show any cardiac abnormalities.  Patient reports having the midsternal discomfort after eating on drinking coffee.  She denies SI/HI/plan Past Psychiatric History: see h&P Family History:  Family History  Problem Relation Age of Onset   Diabetes Mother    CAD Mother    Lung cancer Father    Social History:  Social History   Substance and Sexual Activity   Alcohol Use Yes   Comment: beer occasionally     Social History   Substance and Sexual Activity  Drug Use No    Social History   Socioeconomic History   Marital status: Widowed    Spouse name: Not on file   Number of children: Not on file   Years of education: Not on file   Highest education level: Not on file  Occupational History   Not on file  Tobacco Use   Smoking status: Every Day    Current packs/day: 0.50    Average packs/day: 0.5 packs/day for 40.0 years (20.0 ttl pk-yrs)    Types: Cigarettes   Smokeless tobacco: Never  Vaping Use   Vaping status: Never Used  Substance and Sexual Activity   Alcohol use: Yes    Comment: beer occasionally   Drug use: No   Sexual activity: Not Currently    Birth control/protection: Post-menopausal  Other Topics Concern   Not on file  Social History Narrative   Not on file   Social Drivers of Health   Tobacco Use: High Risk (11/02/2024)   Patient History    Smoking Tobacco Use: Every Day    Smokeless Tobacco Use: Never    Passive Exposure: Not on file  Financial Resource Strain: Not on file  Food Insecurity: No Food Insecurity (10/17/2024)   Epic    Worried About Programme Researcher, Broadcasting/film/video in the Last Year: Never true    Ran Out of Food in the Last Year: Never true  Transportation Needs: No Transportation Needs (10/17/2024)   Epic    Lack of Transportation (Medical): No  Lack of Transportation (Non-Medical): No  Recent Concern: Transportation Needs - Unmet Transportation Needs (10/14/2024)   Epic    Lack of Transportation (Medical): Yes    Lack of Transportation (Non-Medical): Yes  Physical Activity: Not on file  Stress: Not on file  Social Connections: Unknown (10/17/2024)   Social Connection and Isolation Panel    Frequency of Communication with Friends and Family: Patient unable to answer    Frequency of Social Gatherings with Friends and Family: Patient unable to answer    Attends Religious Services: Patient unable  to answer    Active Member of Clubs or Organizations: Patient declined    Attends Banker Meetings: Patient unable to answer    Marital Status: Widowed  Depression (PHQ2-9): Not on file  Alcohol Screen: Low Risk (10/17/2024)   Alcohol Screen    Last Alcohol Screening Score (AUDIT): 0  Housing: Low Risk (10/17/2024)   Epic    Unable to Pay for Housing in the Last Year: No    Number of Times Moved in the Last Year: 0    Homeless in the Last Year: No  Utilities: Not At Risk (10/17/2024)   Epic    Threatened with loss of utilities: No  Health Literacy: Not on file   Past Medical History:  Past Medical History:  Diagnosis Date   Eye globe prosthesis    GERD (gastroesophageal reflux disease)    History of blood transfusion 1973   related to abscess burst in my stomach   Hyperlipemia    Hyperlipidemia    Hypertension    Osteoarthritis of back    Lowerback    SVD (spontaneous vaginal delivery)    x 1, baby died at 2 wks of age   Type II diabetes mellitus (HCC)     Past Surgical History:  Procedure Laterality Date   APPENDECTOMY     COLONOSCOPY     DILATATION & CURETTAGE/HYSTEROSCOPY WITH MYOSURE N/A 02/25/2015   Procedure: DILATATION & CURETTAGE/HYSTEROSCOPY WITH MYOSURE;  Surgeon: Truman Corona, MD;  Location: [facility];  Service: Gynecology;  Laterality: N/A;   DILATION AND CURETTAGE OF UTERUS     ENUCLEATION Right 03/16/2017   ENUCLEATION Right 03/16/2017   Procedure: ENUCLEATION RIGHT EYE;  Surgeon: Loyd Kathryne Palm, MD;  Location: [facility] OR;  Service: Ophthalmology;  Laterality: Right;   EYE SURGERY     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6, 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   EYE SURGERY Right ~ 1974   S/P initial eye injury; scissors stuck in my eye   LAPAROSCOPIC CHOLECYSTECTOMY     SHOULDER ARTHROSCOPY WITH ROTATOR CUFF REPAIR Left    wire stitches per patient   SHOULDER ARTHROSCOPY WITH ROTATOR CUFF REPAIR Right     Current Medications: Current Facility-Administered Medications   Medication Dose Route Frequency Provider Last Rate Last Admin   acetaminophen  (TYLENOL ) tablet 650 mg  650 mg Oral Q6H PRN Tex Drilling, NP   650 mg at 12/07/24 2126   alum & mag hydroxide-simeth (MAALOX/MYLANTA) 200-200-20 MG/5ML suspension 30 mL  30 mL Oral Q4H PRN Nkwenti, Drilling, NP       cloNIDine  (CATAPRES ) tablet 0.1 mg  0.1 mg Oral BID PRN Tex Drilling, NP       divalproex  (DEPAKOTE ) DR tablet 1,500 mg  1,500 mg Oral QHS Kellsie Grindle, MD   1,500 mg at 12/07/24 2126   divalproex  (DEPAKOTE ) DR tablet 500 mg  500 mg Oral q AM Rylynn Kobs, MD   500 mg at 12/08/24  9192   losartan  (COZAAR ) tablet 25 mg  25 mg Oral Daily Tex Drilling, NP   25 mg at 12/08/24 9071   magnesium  hydroxide (MILK OF MAGNESIA) suspension 30 mL  30 mL Oral Daily PRN Tex Drilling, NP       menthol  (CEPACOL) lozenge 3 mg  1 lozenge Oral PRN Bobbitt, Shalon E, NP   3 mg at 12/08/24 0934   methimazole  (TAPAZOLE ) tablet 10 mg  10 mg Oral Daily Nkwenti, Doris, NP   10 mg at 12/08/24 9071   OLANZapine  (ZYPREXA ) injection 5 mg  5 mg Intramuscular TID PRN Tex Drilling, NP       OLANZapine  (ZYPREXA ) injection 5 mg  5 mg Intramuscular TID PRN Tex Drilling, NP       OLANZapine  (ZYPREXA ) tablet 20 mg  20 mg Oral QHS Mellony Danziger, MD   20 mg at 12/07/24 2127   OLANZapine  zydis (ZYPREXA ) disintegrating tablet 5 mg  5 mg Oral TID PRN Tex Drilling, NP       OLANZapine  zydis (ZYPREXA ) disintegrating tablet 5 mg  5 mg Oral TID PRN Tex Drilling, NP       paliperidone  (INVEGA ) 24 hr tablet 3 mg  3 mg Oral Daily Shrivastava, Aryendra, MD   3 mg at 12/08/24 9071   pantoprazole  (PROTONIX ) EC tablet 40 mg  40 mg Oral Daily Nkwenti, Doris, NP   40 mg at 12/08/24 9071   traZODone  (DESYREL ) tablet 50 mg  50 mg Oral QHS PRN Tex Drilling, NP   50 mg at 12/06/24 2109    Lab Results:  No results found for this or any previous visit (from the past 48 hours).       Blood Alcohol level:  Lab Results  Component Value  Date   [facility] <15 10/14/2024   ETH <10 07/02/2022    Metabolic Disorder Labs: Lab Results  Component Value Date   HGBA1C 5.5 10/14/2024   MPG 111.15 10/14/2024   MPG 137 01/06/2022   Lab Results  Component Value Date   PROLACTIN 5.8 10/14/2024   Lab Results  Component Value Date   CHOL 167 10/14/2024   TRIG 68 10/14/2024   HDL 60 10/14/2024   CHOLHDL 2.8 10/14/2024   VLDL 14 10/14/2024   LDLCALC 93 10/14/2024   LDLCALC 73 03/13/2024    Physical Findings: AIMS:  , ,  ,  ,    CIWA:    COWS:      Psychiatric Specialty Exam:  Presentation  General Appearance:  Appropriate for Environment  Eye Contact: Fleeting  Speech: Normal Rate  Speech Volume: Decreased    Mood and Affect  Mood:fine  Affect: Flat   Thought Process  Thought Processes: impoverished  Orientation:Partial  Thought Content: Chronic delusions Hallucinations: Denies  Ideas of Reference:Delusions; Paranoia  Suicidal Thoughts: Denies  Homicidal Thoughts: Denies   Sensorium  Memory: Immediate Fair; Recent Fair  Judgment: Impaired  Insight: Shallow   Executive Functions  Concentration: Fair  Attention Span: Fair  Recall: Poor  Fund of Knowledge: Fair  Language: Fair   Psychomotor Activity  Psychomotor Activity: No data recorded  Musculoskeletal: Strength & Muscle Tone: within normal limits Gait & Station: normal Assets  Assets: Manufacturing Systems Engineer; Desire for Improvement; Social Support    Physical Exam: Physical Exam Vitals and nursing note reviewed.    ROS Blood pressure (!) 152/69, pulse 98, temperature 98.3 F (36.8 C), resp. rate 18, height 5' 8 (1.727 m), weight 48.3 kg, SpO2 100%. Body mass  index is 16.19 kg/m.  Diagnosis: Principal Problem:   Schizophrenia, paranoid (HCC) Major neurocognitive disorder-slums total score of 13/30   Treatment Plan Summary: APS referral has been made as patient lacks capacity to make medical  decisions.  Recommending legal guardianship and further placement at appropriate level of care  Safety and Monitoring:             -- Involuntary admission to inpatient psychiatric unit for safety, stabilization and treatment             -- Daily contact with patient to assess and evaluate symptoms and progress in treatment             -- Patient's case to be discussed in multi-disciplinary team meeting             -- Observation Level: q15 minute checks             -- Vital signs:  q12 hours             -- Precautions: suicide, elopement, and assault   2. Psychiatric Diagnoses and Treatment:  Check Depakote  levels 11/20/2024-60           Depakote  changed to 500 mg every morning and 1500 mg nightly-in next 5 days 11/13/2024 Depakote  level-44 Zyprexa  20 mg nightly BMP-within normal limits Occupational Therapy evaluation for assessing appropriate level of care -- The risks/benefits/side-effects/alternatives to this medication were discussed in detail with the patient and time was given for questions. The patient consents to medication trial.                -- Metabolic profile and EKG monitoring obtained while on an atypical antipsychotic (BMI: Lipid Panel: HbgA1c: QTc:)              -- Encouraged patient to participate in unit milieu and in scheduled group therapies   Occupational Therapy recommendations If plan is discharge home, recommend the following:   Direct supervision/assist for medications management;Direct supervision/assist for financial management;Assist for transportation;Assistance with cooking/housework    Ms Steury was seen for OT treatment on this date. Upon arrival to room pt in dayroom, agreeable to tx. The SLUMS is a 30-point screening questionnaire that tests orientation, memory, attention, problem solving, and executive function. Pt scored a 13/30 indicating Dementia. Of note, it is not within occupational therapy scope of practice to diagnose cognitive impairments,  this screen indicates need for further testing. Pt with noted impairments in memory and problem solving limiting ability to participate functionally in medication management, bill management, and safety cooking. Will continue to follow POC. Continue to recommend SUPERVISION for safety with iADLs.    [name] Mental Status Examination Orientation: 2/3  Calculations: 0/3 Naming animals: 2/3 Patient named 14 animals (0 points is 0-4 animals; 1 is 5-9 animals; 2 is 10-14 animals; 3 is 15+ animals) Recall: 3/5  Attention: 1/2 Clock drawing: 1/4 Visual Processing: 2/2 Paragraph Memory: 2/8  Total: 13/30;  Given that patient has completed high school, this score falls in the dementia range.   4. Discharge Planning:   -- Social work and case management to assist with discharge planning and identification of hospital follow-up needs prior to discharge  -- Estimated LOS: 3-4 days  Millie JONELLE Manners, MD 12/08/2024, 5:46 PM

## 2024-12-09 NOTE — Group Note (Unsigned)
 Date:  12/09/2024 Time:  12:06 PM  Group Topic/Focus:  Goals Group:   The focus of this group is to help patients establish daily goals to achieve during treatment and discuss how the patient can incorporate goal setting into their daily lives to aide in recovery.    Participation Level:  {BHH PARTICIPATION OZCZO:77735}  Participation Quality:  {BHH PARTICIPATION QUALITY:22265}  Affect:  {BHH AFFECT:22266}  Cognitive:  {BHH COGNITIVE:22267}  Insight: {BHH Insight2:20797}  Engagement in Group:  {BHH ENGAGEMENT IN HMNLE:77731}  Modes of Intervention:  {BHH MODES OF INTERVENTION:22269}  Additional Comments:  ***  Maglione,Wayburn Shaler E 12/09/2024, 12:06 PM

## 2024-12-10 DIAGNOSIS — F0390 Unspecified dementia without behavioral disturbance: Secondary | ICD-10-CM | POA: Diagnosis not present

## 2024-12-10 DIAGNOSIS — F200 Paranoid schizophrenia: Secondary | ICD-10-CM | POA: Diagnosis not present

## 2024-12-10 NOTE — Group Note (Signed)
 Date:  12/10/2024 Time:  9:06 PM  Group Topic/Focus:  Wrap-Up Group:   The focus of this group is to help patients review their daily goal of treatment and discuss progress on daily workbooks.    Participation Level:  Active  Participation Quality:  Attentive  Affect:  Appropriate  Cognitive:  Alert  Insight: Limited  Engagement in Group:  Limited  Modes of Intervention:  Discussion  Additional Comments:    Shari [name] Getting 12/10/2024, 9:06 PM

## 2024-12-10 NOTE — Progress Notes (Signed)
 Patient is an involuntary admission to [facility] for SAD / bipolar type awaiting disposition with APS for placement.  Patient is calm and cooperative and participates in meals and some groups.  She interacts well with peers and staff.  Denies SI, HI, AVH, anxiety and depression.  Will continue to monitor.

## 2024-12-10 NOTE — BHH Counselor (Signed)
 CSW reached out to [name], APS caseworker, [phone]. Arch reports there are no updates on placement for pt at this time, reports she is still sending pt's FL2 out.   CSW requests that Lakita visit pt as pt reports she has never met Lakita ( Although Arch has previously come to the hospital to see pt), per provider's request.   Arch reports she will be out to see pt sometime this week.   CSW will inform care team.   Lum Croft, MSW, [facility] 12/10/2024 8:58 AM

## 2024-12-10 NOTE — Progress Notes (Incomplete)
(  Sleep Hours) - (Any PRNs that were needed, meds refused, or side effects to meds)- Trazodone : Tylenol  for neck pain:  (Any disturbances and when (visitation, over night)-None reported or observed (Concerns raised by the patient)- None reported or observed (SI/HI/AVH)- Denies

## 2024-12-10 NOTE — Group Note (Signed)
 Recreation Therapy Group Note   Group Topic:Animal Assisted Therapy   Group Date: 12/10/2024 Start Time: 1020 End Time: 1045 Facilitators: Celestia Jeoffrey BRAVO, LRT, CTRS Location: Dayroom  Group Description: AAA. Animal-Assisted Activity provides opportunities for motivational, educational, therapeutic and/or recreational benefits to enhance quality of life. [name] and [name] visited the unit to interact with patients.   Goal Areas Addressed:  Reduced anxiety and stress Improved mood Increased social interaction Enhanced communication skills Reduced loneliness and isolation Improved emotional regulation   Affect/Mood: Appropriate   Participation Level: Active and Engaged   Participation Quality: Independent   Behavior: Calm and Cooperative   Speech/Thought Process: Coherent   Insight: Fair   Judgement: Fair    Modes of Intervention: Activity   Patient Response to Interventions:  Attentive, Engaged, Interested , and Receptive   Education Outcome:  Acknowledges education   Clinical Observations/Individualized Feedback: Shari [name] was active in their participation of session activities and group discussion. Pt interacted wellw ith LRT and peers duration of session.    Plan: Continue to engage patient in RT group sessions 2-3x/week.   Jeoffrey BRAVO Celestia, LRT, CTRS 12/10/2024 11:56 AM

## 2024-12-10 NOTE — Group Note (Signed)
 Date:  12/10/2024 Time:  4:01 PM  Group Topic/Focus:  Making Healthy Choices:   The focus of this group is to help patients identify negative/unhealthy choices they were using prior to admission and identify positive/healthier coping strategies to replace them upon discharge.    Participation Level:  Minimal  Participation Quality:  Appropriate  Affect:  Appropriate  Cognitive:  Appropriate  Insight: Appropriate  Engagement in Group:  Limited  Modes of Intervention:  Discussion  Additional Comments:  N/A  Harlene LITTIE Gavel 12/10/2024, 4:01 PM

## 2024-12-10 NOTE — Plan of Care (Signed)
   Problem: Education: Goal: Emotional status will improve Outcome: Progressing Goal: Mental status will improve Outcome: Progressing   Problem: Activity: Goal: Interest or engagement in activities will improve Outcome: Progressing Goal: Sleeping patterns will improve Outcome: Progressing   Problem: Coping: Goal: Ability to demonstrate self-control will improve Outcome: Progressing

## 2024-12-10 NOTE — Group Note (Signed)
 LCSW Group Therapy Note  Group Date: 12/10/2024 Start Time: 1330 End Time: 1400   Type of Therapy and Topic:  Group Therapy - Healthy vs Unhealthy Coping Skills  Participation Level:  Did Not Attend   Description of Group The focus of this group was to determine what unhealthy coping techniques typically are used by group members and what healthy coping techniques would be helpful in coping with various problems. Patients were guided in becoming aware of the differences between healthy and unhealthy coping techniques. Patients were asked to identify 2-3 healthy coping skills they would like to learn to use more effectively.  Therapeutic Goals Patients learned that coping is what human beings do all day long to deal with various situations in their lives Patients defined and discussed healthy vs unhealthy coping techniques Patients identified their preferred coping techniques and identified whether these were healthy or unhealthy Patients determined 2-3 healthy coping skills they would like to become more familiar with and use more often. Patients provided support and ideas to each other   Summary of Patient Progress:  X   Therapeutic Modalities Cognitive Behavioral Therapy Motivational Interviewing  Lum Shari [name], LCSWA 12/10/2024  2:25 PM

## 2024-12-10 NOTE — Progress Notes (Signed)
 Patient was calm and cooperative. Slept for 6hrs. Denies anxiety, depression, SI/HI/AVH. PRN lozenge was administered for sore throat.    12/09/24 2100  Psych Admission Type (Psych Patients Only)  Admission Status Involuntary  Psychosocial Assessment  Patient Complaints None  Eye Contact Fair  Facial Expression Flat  Affect Appropriate to circumstance  Speech Soft  Interaction Minimal  Motor Activity Slow  Appearance/Hygiene Unremarkable  Behavior Characteristics Cooperative  Mood Pleasant  Thought Process  Coherency WDL  Content WDL  Delusions None reported or observed  Perception WDL  Hallucination None reported or observed  Judgment Impaired  Confusion Mild  Danger to Self  Current suicidal ideation? Denies  Agreement Not to Harm Self Yes  Description of Agreement verbal  Danger to Others  Danger to Others None reported or observed

## 2024-12-10 NOTE — Progress Notes (Signed)
 [facility] MD Progress Note   5:46 PM Shari [name]  MRN:  [id number] Patient is a 69 year old female with a history of Schizoaffective Disorder, Bipolar Type who presents via [employer] under IVC, initiated by CM, Falencio with re-entry program, to [facility] for assessment. Per IVC, [name] has been diagnosed with Schizophrenia and is refusing to take medication. [name] isn't sleeping. Residents report her screaming and yelling all night long.  [name] responding to internal stimuli.  [name] verbally aggressive.  The respondent has locked all of the residents inside of the transitional housing home.  They could not leave the home which prompted the IVC.  Residents did not have access to food, the restroom or anything as a result of being locked in.  [name] is currently under Adult protective services care. Patient is admitted to [facility] unit with Q15 min safety monitoring. Multidisciplinary team approach is offered. Medication management; group/milieu therapy is offered.   Subjective:  Chart reviewed, case discussed in multidisciplinary meeting, patient seen during rounds.   Today on interview patient is noted to be resting in her bed.  She offers no complaints.  Since Pepcid  were started she reports improvement in her chest discomfort after eating.  She denies SI/HI/plan and denies hallucinations.  Per nursing patient has not displayed any behavioral problems Past Psychiatric History: see h&P Family History:  Family History  Problem Relation Age of Onset   Diabetes Mother    CAD Mother    Lung cancer Father    Social History:  Social History   Substance and Sexual Activity  Alcohol Use Yes   Comment: beer occasionally     Social History   Substance and Sexual Activity  Drug Use No    Social History   Socioeconomic History   Marital status: Widowed    Spouse name: Not on file   Number of children: Not on file   Years of education: Not on file   Highest  education level: Not on file  Occupational History   Not on file  Tobacco Use   Smoking status: Every Day    Current packs/day: 0.50    Average packs/day: 0.5 packs/day for 40.0 years (20.0 ttl pk-yrs)    Types: Cigarettes   Smokeless tobacco: Never  Vaping Use   Vaping status: Never Used  Substance and Sexual Activity   Alcohol use: Yes    Comment: beer occasionally   Drug use: No   Sexual activity: Not Currently    Birth control/protection: Post-menopausal  Other Topics Concern   Not on file  Social History Narrative   Not on file   Social Drivers of Health   Tobacco Use: High Risk (11/02/2024)   Patient History    Smoking Tobacco Use: Every Day    Smokeless Tobacco Use: Never    Passive Exposure: Not on file  Financial Resource Strain: Not on file  Food Insecurity: No Food Insecurity (10/17/2024)   Epic    Worried About Programme Researcher, Broadcasting/film/video in the Last Year: Never true    Ran Out of Food in the Last Year: Never true  Transportation Needs: No Transportation Needs (10/17/2024)   Epic    Lack of Transportation (Medical): No    Lack of Transportation (Non-Medical): No  Recent Concern: Transportation Needs - Unmet Transportation Needs (10/14/2024)   Epic    Lack of Transportation (Medical): Yes    Lack of Transportation (Non-Medical): Yes  Physical Activity: Not on file  Stress: Not on file  Social Connections: Unknown (10/17/2024)   Social Connection and Isolation Panel    Frequency of Communication with Friends and Family: Patient unable to answer    Frequency of Social Gatherings with Friends and Family: Patient unable to answer    Attends Religious Services: Patient unable to answer    Active Member of Clubs or Organizations: Patient declined    Attends Banker Meetings: Patient unable to answer    Marital Status: Widowed  Depression (PHQ2-9): Not on file  Alcohol Screen: Low Risk (10/17/2024)   Alcohol Screen    Last Alcohol Screening Score  (AUDIT): 0  Housing: Low Risk (10/17/2024)   Epic    Unable to Pay for Housing in the Last Year: No    Number of Times Moved in the Last Year: 0    Homeless in the Last Year: No  Utilities: Not At Risk (10/17/2024)   Epic    Threatened with loss of utilities: No  Health Literacy: Not on file   Past Medical History:  Past Medical History:  Diagnosis Date   Eye globe prosthesis    GERD (gastroesophageal reflux disease)    History of blood transfusion 1973   related to abscess burst in my stomach   Hyperlipemia    Hyperlipidemia    Hypertension    Osteoarthritis of back    Lowerback    SVD (spontaneous vaginal delivery)    x 1, baby died at 2 wks of age   Type II diabetes mellitus (HCC)     Past Surgical History:  Procedure Laterality Date   APPENDECTOMY     COLONOSCOPY     DILATATION & CURETTAGE/HYSTEROSCOPY WITH MYOSURE N/A 02/25/2015   Procedure: DILATATION & CURETTAGE/HYSTEROSCOPY WITH MYOSURE;  Surgeon: Truman Corona, MD;  Location: [facility];  Service: Gynecology;  Laterality: N/A;   DILATION AND CURETTAGE OF UTERUS     ENUCLEATION Right 03/16/2017   ENUCLEATION Right 03/16/2017   Procedure: ENUCLEATION RIGHT EYE;  Surgeon: Loyd Kathryne Palm, MD;  Location: [facility] OR;  Service: Ophthalmology;  Laterality: Right;   EYE SURGERY     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6, 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   EYE SURGERY Right ~ 1974   S/P initial eye injury; scissors stuck in my eye   LAPAROSCOPIC CHOLECYSTECTOMY     SHOULDER ARTHROSCOPY WITH ROTATOR CUFF REPAIR Left    wire stitches per patient   SHOULDER ARTHROSCOPY WITH ROTATOR CUFF REPAIR Right     Current Medications: Current Facility-Administered Medications  Medication Dose Route Frequency Provider Last Rate Last Admin   acetaminophen  (TYLENOL ) tablet 650 mg  650 mg Oral Q6H PRN Tex Drilling, NP   650 mg at 12/07/24 2126   alum & mag hydroxide-simeth (MAALOX/MYLANTA) 200-200-20 MG/5ML suspension 30 mL  30 mL Oral Q4H PRN Tex Drilling, NP        cloNIDine  (CATAPRES ) tablet 0.1 mg  0.1 mg Oral BID PRN Tex Drilling, NP       divalproex  (DEPAKOTE ) DR tablet 1,500 mg  1,500 mg Oral QHS Yosgar Demirjian, MD   1,500 mg at 12/07/24 2126   divalproex  (DEPAKOTE ) DR tablet 500 mg  500 mg Oral q AM Yuliya Nova, MD   500 mg at 12/08/24 9192   losartan  (COZAAR ) tablet 25 mg  25 mg Oral Daily Nkwenti, Doris, NP   25 mg at 12/08/24 9071   magnesium  hydroxide (MILK OF MAGNESIA) suspension 30 mL  30 mL Oral Daily PRN Tex Drilling,  NP       menthol  (CEPACOL) lozenge 3 mg  1 lozenge Oral PRN Bobbitt, Shalon E, NP   3 mg at 12/08/24 0934   methimazole  (TAPAZOLE ) tablet 10 mg  10 mg Oral Daily Nkwenti, Doris, NP   10 mg at 12/08/24 9071   OLANZapine  (ZYPREXA ) injection 5 mg  5 mg Intramuscular TID PRN Tex Drilling, NP       OLANZapine  (ZYPREXA ) injection 5 mg  5 mg Intramuscular TID PRN Tex Drilling, NP       OLANZapine  (ZYPREXA ) tablet 20 mg  20 mg Oral QHS Meldon Hanzlik, MD   20 mg at 12/07/24 2127   OLANZapine  zydis (ZYPREXA ) disintegrating tablet 5 mg  5 mg Oral TID PRN Tex Drilling, NP       OLANZapine  zydis (ZYPREXA ) disintegrating tablet 5 mg  5 mg Oral TID PRN Tex Drilling, NP       paliperidone  (INVEGA ) 24 hr tablet 3 mg  3 mg Oral Daily Shrivastava, Aryendra, MD   3 mg at 12/08/24 9071   pantoprazole  (PROTONIX ) EC tablet 40 mg  40 mg Oral Daily Nkwenti, Doris, NP   40 mg at 12/08/24 9071   traZODone  (DESYREL ) tablet 50 mg  50 mg Oral QHS PRN Tex Drilling, NP   50 mg at 12/06/24 2109    Lab Results:  No results found for this or any previous visit (from the past 48 hours).       Blood Alcohol level:  Lab Results  Component Value Date   [facility] <15 10/14/2024   ETH <10 07/02/2022    Metabolic Disorder Labs: Lab Results  Component Value Date   HGBA1C 5.5 10/14/2024   MPG 111.15 10/14/2024   MPG 137 01/06/2022   Lab Results  Component Value Date   PROLACTIN 5.8 10/14/2024   Lab Results  Component Value Date    CHOL 167 10/14/2024   TRIG 68 10/14/2024   HDL 60 10/14/2024   CHOLHDL 2.8 10/14/2024   VLDL 14 10/14/2024   LDLCALC 93 10/14/2024   LDLCALC 73 03/13/2024    Physical Findings: AIMS:  , ,  ,  ,    CIWA:    COWS:      Psychiatric Specialty Exam:  Presentation  General Appearance:  Appropriate for Environment  Eye Contact: Fleeting  Speech: Normal Rate  Speech Volume: Decreased    Mood and Affect  Mood:fine  Affect: Flat   Thought Process  Thought Processes: impoverished  Orientation:Partial  Thought Content: Chronic delusions Hallucinations: Denies  Ideas of Reference:Delusions; Paranoia  Suicidal Thoughts: Denies  Homicidal Thoughts: Denies   Sensorium  Memory: Immediate Fair; Recent Fair  Judgment: Impaired  Insight: Shallow   Executive Functions  Concentration: Fair  Attention Span: Fair  Recall: Poor  Fund of Knowledge: Fair  Language: Fair   Psychomotor Activity  Psychomotor Activity: No data recorded  Musculoskeletal: Strength & Muscle Tone: within normal limits Gait & Station: normal Assets  Assets: Manufacturing Systems Engineer; Desire for Improvement; Social Support    Physical Exam: Physical Exam Vitals and nursing note reviewed.    ROS Blood pressure (!) 152/69, pulse 98, temperature 98.3 F (36.8 C), resp. rate 18, height 5' 8 (1.727 m), weight 48.3 kg, SpO2 100%. Body mass index is 16.19 kg/m.  Diagnosis: Principal Problem:   Schizophrenia, paranoid (HCC) Major neurocognitive disorder-slums total score of 13/30   Treatment Plan Summary: APS referral has been made as patient lacks capacity to make medical decisions.  Recommending legal  guardianship and further placement at appropriate level of care  Safety and Monitoring:             -- Involuntary admission to inpatient psychiatric unit for safety, stabilization and treatment             -- Daily contact with patient to assess and evaluate symptoms  and progress in treatment             -- Patient's case to be discussed in multi-disciplinary team meeting             -- Observation Level: q15 minute checks             -- Vital signs:  q12 hours             -- Precautions: suicide, elopement, and assault   2. Psychiatric Diagnoses and Treatment:  Check Depakote  levels 11/20/2024-60           Depakote  changed to 500 mg every morning and 1500 mg nightly-in next 5 days 11/13/2024 Depakote  level-44 Zyprexa  20 mg nightly BMP-within normal limits Occupational Therapy evaluation for assessing appropriate level of care -- The risks/benefits/side-effects/alternatives to this medication were discussed in detail with the patient and time was given for questions. The patient consents to medication trial.                -- Metabolic profile and EKG monitoring obtained while on an atypical antipsychotic (BMI: Lipid Panel: HbgA1c: QTc:)              -- Encouraged patient to participate in unit milieu and in scheduled group therapies   Occupational Therapy recommendations If plan is discharge home, recommend the following:   Direct supervision/assist for medications management;Direct supervision/assist for financial management;Assist for transportation;Assistance with cooking/housework    Ms Tye was seen for OT treatment on this date. Upon arrival to room pt in dayroom, agreeable to tx. The SLUMS is a 30-point screening questionnaire that tests orientation, memory, attention, problem solving, and executive function. Pt scored a 13/30 indicating Dementia. Of note, it is not within occupational therapy scope of practice to diagnose cognitive impairments, this screen indicates need for further testing. Pt with noted impairments in memory and problem solving limiting ability to participate functionally in medication management, bill management, and safety cooking. Will continue to follow POC. Continue to recommend SUPERVISION for safety with iADLs.     [facility] Mental Status Examination Orientation: 2/3  Calculations: 0/3 Naming animals: 2/3 Patient named 14 animals (0 points is 0-4 animals; 1 is 5-9 animals; 2 is 10-14 animals; 3 is 15+ animals) Recall: 3/5  Attention: 1/2 Clock drawing: 1/4 Visual Processing: 2/2 Paragraph Memory: 2/8  Total: 13/30;  Given that patient has completed high school, this score falls in the dementia range.   4. Discharge Planning:   -- Social work and case management to assist with discharge planning and identification of hospital follow-up needs prior to discharge  -- Estimated LOS: 3-4 days  Millie JONELLE Manners, MD 12/08/2024, 5:46 PM

## 2024-12-10 NOTE — Progress Notes (Signed)
 Occupational Therapy Treatment Patient Details Name: Shari [name] MRN: [id number] DOB: 09-14-1956 Today's Date: 12/10/2024   History of present illness 69 year old female with a history of Schizoaffective Disorder, Bipolar Type who presents via GPD under IVC. Per IVC, pt refused medications, wasn't sleeping, responding to internal stimuli, verbally aggressive.   OT comments  Pt seen for OT tx. Pt completed SLUMS cognitive assessment (see details below) and pill box test. Pt demonstrated difficulty with seeing the small print on the medication bottles despite reading glasses. Was able to open various medication containers without difficulty. Had difficulty with using weekly pill box and did ultimately FAIL due to errors made and cues required to assist. Pt would be best served with supervision/assist for all medication management at discharge. Pt not demonstrating progress towards goals. Recommend discontinue acute OT services. Recommend follow up OT per physician at discharge.   [facility] Mental Status Examination Orientation: 2/3  Calculations: 3/3 Naming animals: 2/3 Patient named 10 animals (0 points is 0-4 animals; 1 is 5-9 animals; 2 is 10-14 animals; 3 is 15+ animals) Recall: 3/5  Attention: 2/2 Clock drawing: 2/4 Visual Processing: 2/2 Paragraph Memory: 0/8  Total: 16/30;  Given that patient has >high school level of education, this score falls in the DEMENTIA range.       If plan is discharge home, recommend the following:  Direct supervision/assist for medications management;Direct supervision/assist for financial management;Assist for transportation;Assistance with cooking/housework;Supervision due to cognitive status   Equipment Recommendations  None recommended by OT    Recommendations for Other Services      Precautions / Restrictions Precautions Precautions: None Restrictions Weight Bearing Restrictions Per Provider Order: No       Mobility Bed  Mobility Overal bed mobility: Independent                  Transfers Overall transfer level: Independent Equipment used: None                     Balance Overall balance assessment: No apparent balance deficits (not formally assessed)                                         ADL either performed or assessed with clinical judgement   ADL Overall ADL's : Independent                                            Extremity/Trunk Assessment              Vision       Perception     Praxis     Communication Communication Communication: No apparent difficulties   Cognition Arousal: Alert Behavior During Therapy: WFL for tasks assessed/performed Cognition: Cognition impaired, No family/caregiver present to determine baseline   Orientation impairments: Situation Awareness: Online awareness impaired, Intellectual awareness impaired   Attention impairment (select first level of impairment): Sustained attention Executive functioning impairment (select all impairments): Problem solving, Reasoning                   Following commands: Intact        Cueing   Cueing Techniques: Verbal cues  Exercises Other Exercises Other Exercises: Pt completed SLUMS cognitive assessment and pill box test.  Shoulder Instructions       General Comments      Pertinent Vitals/ Pain       Pain Assessment Pain Assessment: No/denies pain  Home Living                                          Prior Functioning/Environment              Frequency  Min 1X/week        Progress Toward Goals  OT Goals(current goals can now be found in the care plan section)  Progress towards OT goals: Not progressing toward goals - comment  Acute Rehab OT Goals Patient Stated Goal: go home and be independent OT Goal Formulation: All assessment and education complete, DC therapy  Plan      Co-evaluation                  AM-PAC OT 6 Clicks Daily Activity     Outcome Measure   Help from another person eating meals?: None Help from another person taking care of personal grooming?: None Help from another person toileting, which includes using toliet, bedpan, or urinal?: None Help from another person bathing (including washing, rinsing, drying)?: None Help from another person to put on and taking off regular upper body clothing?: None Help from another person to put on and taking off regular lower body clothing?: None 6 Click Score: 24    End of Session    OT Visit Diagnosis: Other symptoms and signs involving cognitive function   Activity Tolerance Patient tolerated treatment well   Patient Left Other (comment) (ambulating aroudn the unit)   Nurse Communication          Time: 8546-8476 OT Time Calculation (min): 30 min  Charges: OT General Charges $OT Visit: 1 Visit OT Treatments $Therapeutic Activity: 8-22 mins $Cognitive Funtion inital: Initial 15 mins  Warren SAUNDERS., MPH, MS, OTR/L ascom [phone] 12/10/2024, 3:45 PM

## 2024-12-11 DIAGNOSIS — F0390 Unspecified dementia without behavioral disturbance: Secondary | ICD-10-CM | POA: Diagnosis not present

## 2024-12-11 DIAGNOSIS — F200 Paranoid schizophrenia: Secondary | ICD-10-CM | POA: Diagnosis not present

## 2024-12-11 LAB — COMPREHENSIVE METABOLIC PANEL WITH GFR
ALT: 33 U/L (ref 0–44)
AST: 32 U/L (ref 15–41)
Albumin: 4.1 g/dL (ref 3.5–5.0)
Alkaline Phosphatase: 188 U/L — ABNORMAL HIGH (ref 38–126)
Anion gap: 12 (ref 5–15)
BUN: 20 mg/dL (ref 8–23)
CO2: 23 mmol/L (ref 22–32)
Calcium: 9.6 mg/dL (ref 8.9–10.3)
Chloride: 100 mmol/L (ref 98–111)
Creatinine, Ser: 0.76 mg/dL (ref 0.44–1.00)
GFR, Estimated: >60 mL/min
Glucose, Bld: 131 mg/dL — ABNORMAL HIGH (ref 70–99)
Potassium: 4.6 mmol/L (ref 3.5–5.1)
Sodium: 135 mmol/L (ref 135–145)
Total Bilirubin: 0.3 mg/dL (ref 0.0–1.2)
Total Protein: 7.4 g/dL (ref 6.5–8.1)

## 2024-12-11 LAB — TROPONIN T, HIGH SENSITIVITY: Troponin T High Sensitivity: <15 ng/L (ref 0–19)

## 2024-12-11 NOTE — Plan of Care (Signed)
?  Problem: Education: ?Goal: Emotional status will improve ?Outcome: Progressing ?Goal: Mental status will improve ?Outcome: Progressing ?  ?Problem: Health Behavior/Discharge Planning: ?Goal: Compliance with treatment plan for underlying cause of condition will improve ?Outcome: Progressing ?  ?Problem: Safety: ?Goal: Periods of time without injury will increase ?Outcome: Progressing ?  ?

## 2024-12-11 NOTE — Progress Notes (Signed)
 [facility] MD Progress Note   5:46 PM Shari [name]  MRN:  [id number] Patient is a 69 year old female with a history of Schizoaffective Disorder, Bipolar Type who presents via [employer] under IVC, initiated by CM, Falencio with re-entry program, to [facility] for assessment. Per IVC, [name] has been diagnosed with Schizophrenia and is refusing to take medication. [name] isn't sleeping. Residents report her screaming and yelling all night long.  [name] responding to internal stimuli.  [name] verbally aggressive.  The respondent has locked all of the residents inside of the transitional housing home.  They could not leave the home which prompted the IVC.  Residents did not have access to food, the restroom or anything as a result of being locked in.  [name] is currently under Adult protective services care. Patient is admitted to [facility] unit with Q15 min safety monitoring. Multidisciplinary team approach is offered. Medication management; group/milieu therapy is offered.   Subjective:  Chart reviewed, case discussed in multidisciplinary meeting, patient seen during rounds.   Patient is noted to be resting in bed.  She reports ongoing chest discomfort but denies having any radiation to left arm.  She denies having any swelling or pedal edema or puffiness of face.  She does report shortness of breath but denies having any shortness of breath while walking or laying flat on the bed.  Patient was updated that team will be getting an EKG, troponin level.  Hospitalist will be consulted depending on the labs.  Patient denies SI/HI/plan and denies hallucinations.  She remains delusional and unable to comprehend the reason for her prolonged hospital stay, her recent legal guardianship hearing and waiting for placement. Per social work team, DSS caseworker came by and explained the legal guardianship decision and the plan of finding appropriate level of placement accommodating her cognitive  changes. Past Psychiatric History: see h&P Family History:  Family History  Problem Relation Age of Onset   Diabetes Mother    CAD Mother    Lung cancer Father    Social History:  Social History   Substance and Sexual Activity  Alcohol Use Yes   Comment: beer occasionally     Social History   Substance and Sexual Activity  Drug Use No    Social History   Socioeconomic History   Marital status: Widowed    Spouse name: Not on file   Number of children: Not on file   Years of education: Not on file   Highest education level: Not on file  Occupational History   Not on file  Tobacco Use   Smoking status: Every Day    Current packs/day: 0.50    Average packs/day: 0.5 packs/day for 40.0 years (20.0 ttl pk-yrs)    Types: Cigarettes   Smokeless tobacco: Never  Vaping Use   Vaping status: Never Used  Substance and Sexual Activity   Alcohol use: Yes    Comment: beer occasionally   Drug use: No   Sexual activity: Not Currently    Birth control/protection: Post-menopausal  Other Topics Concern   Not on file  Social History Narrative   Not on file   Social Drivers of Health   Tobacco Use: High Risk (11/02/2024)   Patient History    Smoking Tobacco Use: Every Day    Smokeless Tobacco Use: Never    Passive Exposure: Not on file  Financial Resource Strain: Not on file  Food Insecurity: No Food Insecurity (10/17/2024)   Epic  Worried About Programme Researcher, Broadcasting/film/video in the Last Year: Never true    Ran Out of Food in the Last Year: Never true  Transportation Needs: No Transportation Needs (10/17/2024)   Epic    Lack of Transportation (Medical): No    Lack of Transportation (Non-Medical): No  Recent Concern: Transportation Needs - Unmet Transportation Needs (10/14/2024)   Epic    Lack of Transportation (Medical): Yes    Lack of Transportation (Non-Medical): Yes  Physical Activity: Not on file  Stress: Not on file  Social Connections: Unknown (10/17/2024)   Social  Connection and Isolation Panel    Frequency of Communication with Friends and Family: Patient unable to answer    Frequency of Social Gatherings with Friends and Family: Patient unable to answer    Attends Religious Services: Patient unable to answer    Active Member of Clubs or Organizations: Patient declined    Attends Banker Meetings: Patient unable to answer    Marital Status: Widowed  Depression (PHQ2-9): Not on file  Alcohol Screen: Low Risk (10/17/2024)   Alcohol Screen    Last Alcohol Screening Score (AUDIT): 0  Housing: Low Risk (10/17/2024)   Epic    Unable to Pay for Housing in the Last Year: No    Number of Times Moved in the Last Year: 0    Homeless in the Last Year: No  Utilities: Not At Risk (10/17/2024)   Epic    Threatened with loss of utilities: No  Health Literacy: Not on file   Past Medical History:  Past Medical History:  Diagnosis Date   Eye globe prosthesis    GERD (gastroesophageal reflux disease)    History of blood transfusion 1973   related to abscess burst in my stomach   Hyperlipemia    Hyperlipidemia    Hypertension    Osteoarthritis of back    Lowerback    SVD (spontaneous vaginal delivery)    x 1, baby died at 2 wks of age   Type II diabetes mellitus (HCC)     Past Surgical History:  Procedure Laterality Date   APPENDECTOMY     COLONOSCOPY     DILATATION & CURETTAGE/HYSTEROSCOPY WITH MYOSURE N/A 02/25/2015   Procedure: DILATATION & CURETTAGE/HYSTEROSCOPY WITH MYOSURE;  Surgeon: Truman Corona, MD;  Location: [facility];  Service: Gynecology;  Laterality: N/A;   DILATION AND CURETTAGE OF UTERUS     ENUCLEATION Right 03/16/2017   ENUCLEATION Right 03/16/2017   Procedure: ENUCLEATION RIGHT EYE;  Surgeon: Loyd Kathryne Palm, MD;  Location: [facility] OR;  Service: Ophthalmology;  Laterality: Right;   EYE SURGERY     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6, 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   EYE SURGERY Right ~ 1974   S/P initial eye injury; scissors stuck in my eye    LAPAROSCOPIC CHOLECYSTECTOMY     SHOULDER ARTHROSCOPY WITH ROTATOR CUFF REPAIR Left    wire stitches per patient   SHOULDER ARTHROSCOPY WITH ROTATOR CUFF REPAIR Right     Current Medications: Current Facility-Administered Medications  Medication Dose Route Frequency Provider Last Rate Last Admin   acetaminophen  (TYLENOL ) tablet 650 mg  650 mg Oral Q6H PRN Tex Drilling, NP   650 mg at 12/07/24 2126   alum & mag hydroxide-simeth (MAALOX/MYLANTA) 200-200-20 MG/5ML suspension 30 mL  30 mL Oral Q4H PRN Nkwenti, Drilling, NP       cloNIDine  (CATAPRES ) tablet 0.1 mg  0.1 mg Oral BID PRN Tex Drilling, NP  divalproex  (DEPAKOTE ) DR tablet 1,500 mg  1,500 mg Oral QHS Blayde Bacigalupi, MD   1,500 mg at 12/07/24 2126   divalproex  (DEPAKOTE ) DR tablet 500 mg  500 mg Oral q AM Jerimey Burridge, MD   500 mg at 12/08/24 9192   losartan  (COZAAR ) tablet 25 mg  25 mg Oral Daily Nkwenti, Doris, NP   25 mg at 12/08/24 9071   magnesium  hydroxide (MILK OF MAGNESIA) suspension 30 mL  30 mL Oral Daily PRN Tex Drilling, NP       menthol  (CEPACOL) lozenge 3 mg  1 lozenge Oral PRN Bobbitt, Shalon E, NP   3 mg at 12/08/24 0934   methimazole  (TAPAZOLE ) tablet 10 mg  10 mg Oral Daily Nkwenti, Doris, NP   10 mg at 12/08/24 9071   OLANZapine  (ZYPREXA ) injection 5 mg  5 mg Intramuscular TID PRN Tex Drilling, NP       OLANZapine  (ZYPREXA ) injection 5 mg  5 mg Intramuscular TID PRN Tex Drilling, NP       OLANZapine  (ZYPREXA ) tablet 20 mg  20 mg Oral QHS Levis Nazir, MD   20 mg at 12/07/24 2127   OLANZapine  zydis (ZYPREXA ) disintegrating tablet 5 mg  5 mg Oral TID PRN Tex Drilling, NP       OLANZapine  zydis (ZYPREXA ) disintegrating tablet 5 mg  5 mg Oral TID PRN Tex Drilling, NP       paliperidone  (INVEGA ) 24 hr tablet 3 mg  3 mg Oral Daily Shrivastava, Aryendra, MD   3 mg at 12/08/24 9071   pantoprazole  (PROTONIX ) EC tablet 40 mg  40 mg Oral Daily Nkwenti, Doris, NP   40 mg at 12/08/24 9071   traZODone   (DESYREL ) tablet 50 mg  50 mg Oral QHS PRN Tex Drilling, NP   50 mg at 12/06/24 2109    Lab Results:  No results found for this or any previous visit (from the past 48 hours).       Blood Alcohol level:  Lab Results  Component Value Date   [facility] <15 10/14/2024   ETH <10 07/02/2022    Metabolic Disorder Labs: Lab Results  Component Value Date   HGBA1C 5.5 10/14/2024   MPG 111.15 10/14/2024   MPG 137 01/06/2022   Lab Results  Component Value Date   PROLACTIN 5.8 10/14/2024   Lab Results  Component Value Date   CHOL 167 10/14/2024   TRIG 68 10/14/2024   HDL 60 10/14/2024   CHOLHDL 2.8 10/14/2024   VLDL 14 10/14/2024   LDLCALC 93 10/14/2024   LDLCALC 73 03/13/2024    Physical Findings: AIMS:  , ,  ,  ,    CIWA:    COWS:      Psychiatric Specialty Exam:  Presentation  General Appearance:  Appropriate for Environment  Eye Contact: Fleeting  Speech: Normal Rate  Speech Volume: Decreased    Mood and Affect  Mood:fine  Affect: Flat   Thought Process  Thought Processes: impoverished  Orientation:Partial  Thought Content: Chronic delusions Hallucinations: Denies  Ideas of Reference:Delusions; Paranoia  Suicidal Thoughts: Denies  Homicidal Thoughts: Denies   Sensorium  Memory: Immediate Fair; Recent Fair  Judgment: Impaired  Insight: Shallow   Executive Functions  Concentration: Fair  Attention Span: Fair  Recall: Poor  Fund of Knowledge: Fair  Language: Fair   Psychomotor Activity  Psychomotor Activity: No data recorded  Musculoskeletal: Strength & Muscle Tone: within normal limits Gait & Station: normal Assets  Assets: Manufacturing Systems Engineer; Desire for Improvement;  Social Support    Physical Exam: Physical Exam Vitals and nursing note reviewed.    ROS Blood pressure (!) 152/69, pulse 98, temperature 98.3 F (36.8 C), resp. rate 18, height 5' 8 (1.727 m), weight 48.3 kg, SpO2 100%. Body mass  index is 16.19 kg/m.  Diagnosis: Principal Problem:   Schizophrenia, paranoid (HCC) Major neurocognitive disorder-slums total score of 13/30   Treatment Plan Summary: APS referral has been made as patient lacks capacity to make medical decisions.  Recommending legal guardianship and further placement at appropriate level of care  Safety and Monitoring:             -- Involuntary admission to inpatient psychiatric unit for safety, stabilization and treatment             -- Daily contact with patient to assess and evaluate symptoms and progress in treatment             -- Patient's case to be discussed in multi-disciplinary team meeting             -- Observation Level: q15 minute checks             -- Vital signs:  q12 hours             -- Precautions: suicide, elopement, and assault   2. Psychiatric Diagnoses and Treatment:  Check Depakote  levels 11/20/2024-60           Depakote  changed to 500 mg every morning and 1500 mg nightly-in next 5 days 11/13/2024 Depakote  level-44 Zyprexa  20 mg nightly BMP-within normal limits Occupational Therapy evaluation for assessing appropriate level of care -- The risks/benefits/side-effects/alternatives to this medication were discussed in detail with the patient and time was given for questions. The patient consents to medication trial.                -- Metabolic profile and EKG monitoring obtained while on an atypical antipsychotic (BMI: Lipid Panel: HbgA1c: QTc:)              -- Encouraged patient to participate in unit milieu and in scheduled group therapies   Occupational Therapy recommendations If plan is discharge home, recommend the following:   Direct supervision/assist for medications management;Direct supervision/assist for financial management;Assist for transportation;Assistance with cooking/housework    Shari [name] was seen for OT treatment on this date. Upon arrival to room pt in dayroom, agreeable to tx. The SLUMS is a 30-point  screening questionnaire that tests orientation, memory, attention, problem solving, and executive function. Pt scored a 13/30 indicating Dementia. Of note, it is not within occupational therapy scope of practice to diagnose cognitive impairments, this screen indicates need for further testing. Pt with noted impairments in memory and problem solving limiting ability to participate functionally in medication management, bill management, and safety cooking. Will continue to follow POC. Continue to recommend SUPERVISION for safety with iADLs.    [facility] Mental Status Examination Orientation: 2/3  Calculations: 0/3 Naming animals: 2/3 Patient named 14 animals (0 points is 0-4 animals; 1 is 5-9 animals; 2 is 10-14 animals; 3 is 15+ animals) Recall: 3/5  Attention: 1/2 Clock drawing: 1/4 Visual Processing: 2/2 Paragraph Memory: 2/8  Total: 13/30;  Given that patient has completed high school, this score falls in the dementia range.   4. Discharge Planning:   -- Social work and case management to assist with discharge planning and identification of hospital follow-up needs prior to discharge  -- Estimated LOS: 3-4 days  [employer]  JONELLE Manners, MD 12/08/2024, 5:46 PM

## 2024-12-11 NOTE — Progress Notes (Signed)
" °   12/11/24 2100  Psych Admission Type (Psych Patients Only)  Admission Status Involuntary  Psychosocial Assessment  Patient Complaints None  Eye Contact Fair  Facial Expression Flat  Affect Appropriate to circumstance  Speech Logical/coherent  Interaction Assertive  Motor Activity Slow  Appearance/Hygiene Unremarkable  Behavior Characteristics Cooperative  Mood Pleasant  Thought Process  Coherency WDL  Content WDL  Delusions None reported or observed  Perception WDL  Hallucination None reported or observed  Judgment Impaired  Confusion Mild  Danger to Self  Current suicidal ideation? Denies  Agreement Not to Harm Self Yes  Description of Agreement Verbal  Danger to Others  Danger to Others None reported or observed    "

## 2024-12-11 NOTE — Group Note (Signed)
 Date:  12/11/2024 Time:  4:59 PM  Group Topic/Focus:  Coping With Mental Health Crisis:   The purpose of this group is to help patients identify strategies for coping with mental health crisis.  Group discusses possible causes of crisis and ways to manage them effectively. Healthy Communication:   The focus of this group is to discuss communication, barriers to communication, as well as healthy ways to communicate with others. Making Healthy Choices:   The focus of this group is to help patients identify negative/unhealthy choices they were using prior to admission and identify positive/healthier coping strategies to replace them upon discharge.  A group was facilitated focusing on fun, critical-thinking activities. Patients participated in games such as Family Feud and What Would You Do? scenarios. The group encouraged patients to identify positive aspects of situations while engaging in problem-solving and decision-making skills. Patients were given opportunities to share ideas, collaborate, and practice healthy coping strategies in a supportive environment.  Participation Level:  Active  Participation Quality:  Appropriate  Affect:  Appropriate  Cognitive:  Appropriate  Insight: Appropriate and Good  Engagement in Group:  Engaged  Modes of Intervention:  Discussion and Activity  Additional Comments:    Shari [name] L Shari [name] 12/11/2024, 4:59 PM

## 2024-12-11 NOTE — Group Note (Signed)
 Date:  12/11/2024 Time:  9:56 AM  Group Topic/Focus:  Coping With Mental Health Crisis:   The purpose of this group is to help patients identify strategies for coping with mental health crisis.  Group discusses possible causes of crisis and ways to manage them effectively.  Two motivational and supportive meditation videos were provided to promote relaxation and rejuvenation of the mind, body, and spirit. Patients were guided to reflect on their experiences from 2025 and encouraged to make intentional, healthy decisions for entering 2026 on a positive and balanced path. Time was allotted for guided breathing exercises, allowing patients to focus on deep inhalation and exhalation while engaging in personal reflection.  Participation Level:  Did Not Attend  Participation Quality:    Affect:    Cognitive:    Insight:   Engagement in Group:    Modes of Intervention:    Additional Comments:    Shari [name] L Shari [name] 12/11/2024, 9:56 AM

## 2024-12-11 NOTE — Progress Notes (Signed)
 Patient presents: Patient presents with flat affect, states she slept well, and currently denies any pain. Patient is med compliant and remains cooperative in unit.    SI/HI/AVH: Denies   Plan: Denies   Groups attended:2/3   Appetite: Adequate. Attended meals.   Sleep: No sleep disturbances reported.   PRNS: Lozenge po prn which was effective for sore throat.    Disturbances: No disturbances. Patient remains cooperative in milieu.    Questions/concerns: No further questions or concerns.   VS: BP 132/77 (BP Location: Right Arm)   Pulse 75   Temp (!) 97.1 F (36.2 C)   Resp 17   Ht 5' 8 (1.727 m)   Wt 48.3 kg   SpO2 97%   BMI 16.19 kg/m

## 2024-12-11 NOTE — Plan of Care (Signed)
 Patient alert and oriented. Denies SI, HI, AVH. Pain 3/10 neck radiating to shoulder. PRN pain medications and scheduled meds administered per MAR. Support and encouragement provided.  Routine safety checks conducted every 15 minutes.  Patient informed to notify staff with problems or concerns. No adverse drug reactions noted. Patient verbally contracts for safety at this time. Patient interacts well with others on the unit.  Patient remains safe at this time.  Problem: Education: Goal: Emotional status will improve Outcome: Progressing   Problem: Education: Goal: Mental status will improve Outcome: Progressing   Problem: Education: Goal: Verbalization of understanding the information provided will improve Outcome: Progressing   Problem: Health Behavior/Discharge Planning: Goal: Identification of resources available to assist in meeting health care needs will improve Outcome: Progressing Goal: Compliance with treatment plan for underlying cause of condition will improve Outcome: Progressing

## 2024-12-12 DIAGNOSIS — F0390 Unspecified dementia without behavioral disturbance: Secondary | ICD-10-CM | POA: Diagnosis not present

## 2024-12-12 DIAGNOSIS — F200 Paranoid schizophrenia: Secondary | ICD-10-CM | POA: Diagnosis not present

## 2024-12-12 MED ORDER — PANTOPRAZOLE SODIUM 40 MG PO TBEC
40.0000 mg | DELAYED_RELEASE_TABLET | Freq: Two times a day (BID) | ORAL | Status: AC
  Administered 2024-12-12 – 2025-01-10 (×59): 40 mg via ORAL
  Filled 2024-12-12 (×59): qty 1

## 2024-12-12 MED ORDER — SUCRALFATE 1 GM/10ML PO SUSP
1.0000 g | Freq: Three times a day (TID) | ORAL | Status: AC
  Administered 2024-12-12 – 2025-01-10 (×112): 1 g via ORAL
  Filled 2024-12-12 (×120): qty 10

## 2024-12-12 MED ORDER — LIDOCAINE 5 % EX PTCH
1.0000 | MEDICATED_PATCH | CUTANEOUS | Status: DC
  Administered 2024-12-12 – 2024-12-25 (×13): 1 via TRANSDERMAL
  Filled 2024-12-12 (×13): qty 1

## 2024-12-12 NOTE — Group Note (Signed)
 Date:  12/12/2024 Time:  10:53 AM  Group Topic/Focus:  Rediscovering Joy:   The focus of this group is to explore various ways to relieve stress in a positive manner.    Participation Level:  Did Not Attend   Camellia HERO Lavern Crimi 12/12/2024, 10:53 AM

## 2024-12-12 NOTE — [facility] Group Notes (Signed)
 Spirituality Group   Description: Participant directed exploration of values, beliefs and meaning   **Focus on how we generate hope and sit with uncertainty   Following a brief framework of chaplains role and ground rules of group behavior, participants are invited to share concerns or questions that engage spiritual life. Emphasis placed on common themes and shared experiences and ways to make meaning and clarify living into ones values.   Theory/Process/Goal: Utilize the theoretical framework of group therapy established by Celena Kite, Relational Cultural Theory and Rogerian approaches to facilitate relational empathy and use of the here and now to foster reflection, self-awareness, and sharing.   Observations: [name] was reserved but still an active participant in the group discussion, mostly responding when prompted/encouraged.  Shari [name] HERO.Div

## 2024-12-12 NOTE — Group Note (Signed)
 LCSW Group Therapy Note  Group Date: 12/12/2024 Start Time: 1325 End Time: 1340   Type of Therapy and Topic:  Group Therapy - How To Cope with Nervousness about Discharge   Participation Level:  Did Not Attend   Description of Group This process group involved identification of patients' feelings about discharge. Some of them are scheduled to be discharged soon, while others are new admissions, but each of them was asked to share thoughts and feelings surrounding discharge from the hospital. One common theme was that they are excited at the prospect of going home, while another was that many of them are apprehensive about sharing why they were hospitalized. Patients were given the opportunity to discuss these feelings with their peers in preparation for discharge.  Therapeutic Goals  Patient will identify their overall feelings about pending discharge. Patient will think about how they might proactively address issues that they believe will once again arise once they get home (i.e. with parents). Patients will participate in discussion about having hope for change.   Summary of Patient Progress:  X   Therapeutic Modalities Cognitive Behavioral Therapy   Shari [name], LCSWA 12/12/2024  2:20 PM

## 2024-12-12 NOTE — Progress Notes (Signed)
 Behavior:  Pleasant and cooperative.     Psych assessment:  Denies SI/HI and AVH.    Interaction / Group attendance:  Present in the milieu.  Appropriate interaction with peers and staff. Attended 2/4 groups.   Medication/ PRNs: Compliant.   Pain: Denies.  15 min checks in place for safety.    EKG completed and placed in chart.  Lidocaine  patch applied to chest.  Pain 6/10.    New orders for Protonix  BID and Carafate .

## 2024-12-12 NOTE — Plan of Care (Signed)
   Problem: Coping: Goal: Ability to demonstrate self-control will improve Outcome: Progressing   Problem: Health Behavior/Discharge Planning: Goal: Compliance with treatment plan for underlying cause of condition will improve Outcome: Progressing   Problem: Safety: Goal: Periods of time without injury will increase Outcome: Progressing

## 2024-12-12 NOTE — Group Note (Signed)
 Date:  12/12/2024 Time:  8:49 PM  Group Topic/Focus:  Wrap-Up Group:   The focus of this group is to help patients review their daily goal of treatment and discuss progress on daily workbooks.    Participation Level:  Active  Participation Quality:  Appropriate  Affect:  Appropriate  Cognitive:  Appropriate  Insight: Appropriate  Engagement in Group:  Engaged  Modes of Intervention:  Discussion  Additional Comments:    Shari [name] 12/12/2024, 8:49 PM

## 2024-12-12 NOTE — Group Note (Signed)
 Date:  12/12/2024 Time:  3:47 PM  Group Topic/Focus:  Movement Therapy, Getting fit with Jennelle Pinkstaff.    Participation Level:  Active  Participation Quality:  Appropriate  Affect:  Appropriate  Cognitive:  Appropriate  Insight: Appropriate  Engagement in Group:  Engaged  Modes of Intervention:  Activity  Additional Comments:  none  [name] 12/12/2024, 3:47 PM

## 2024-12-12 NOTE — Progress Notes (Signed)
 [facility] MD Progress Note   5:46 PM Shari [name]  MRN:  [id number] Patient is a 69 year old female with a history of Schizoaffective Disorder, Bipolar Type who presents via [facility] under IVC, initiated by CM, Falencio with re-entry program, to [facility] for assessment. Per IVC, [name] has been diagnosed with Schizophrenia and is refusing to take medication. [name] isn't sleeping. Residents report her screaming and yelling all night long.  [name] responding to internal stimuli.  [name] verbally aggressive.  The respondent has locked all of the residents inside of the transitional housing home.  They could not leave the home which prompted the IVC.  Residents did not have access to food, the restroom or anything as a result of being locked in.  [name] is currently under Adult protective services care. Patient is admitted to [facility] unit with Q15 min safety monitoring. Multidisciplinary team approach is offered. Medication management; group/milieu therapy is offered.   Subjective:  Chart reviewed, case discussed in multidisciplinary meeting, patient seen during rounds.  Patient is noted to be resting in her room.  She reports ongoing chest discomfort.  [name] taking her medications and denies having any side effects.  Patient is updated that hospitalist is consulted to further evaluate the ongoing chest discomfort.  Troponin is negative EKG shows abnormal ST changes since November 2025.  Patient denies SI/HI/plan and denies auditory/visual hallucinations  Past Psychiatric History: see h&P Family History:  Family History  Problem Relation Age of Onset   Diabetes Mother    CAD Mother    Lung cancer Father    Social History:  Social History   Substance and Sexual Activity  Alcohol Use Yes   Comment: beer occasionally     Social History   Substance and Sexual Activity  Drug Use No    Social History   Socioeconomic History   Marital status: Widowed    Spouse  name: Not on file   Number of children: Not on file   Years of education: Not on file   Highest education level: Not on file  Occupational History   Not on file  Tobacco Use   Smoking status: Every Day    Current packs/day: 0.50    Average packs/day: 0.5 packs/day for 40.0 years (20.0 ttl pk-yrs)    Types: Cigarettes   Smokeless tobacco: Never  Vaping Use   Vaping status: Never Used  Substance and Sexual Activity   Alcohol use: Yes    Comment: beer occasionally   Drug use: No   Sexual activity: Not Currently    Birth control/protection: Post-menopausal  Other Topics Concern   Not on file  Social History Narrative   Not on file   Social Drivers of Health   Tobacco Use: High Risk (11/02/2024)   Patient History    Smoking Tobacco Use: Every Day    Smokeless Tobacco Use: Never    Passive Exposure: Not on file  Financial Resource Strain: Not on file  Food Insecurity: No Food Insecurity (10/17/2024)   Epic    Worried About Programme Researcher, Broadcasting/film/video in the Last Year: Never true    Ran Out of Food in the Last Year: Never true  Transportation Needs: No Transportation Needs (10/17/2024)   Epic    Lack of Transportation (Medical): No    Lack of Transportation (Non-Medical): No  Recent Concern: Transportation Needs - Unmet Transportation Needs (10/14/2024)   Epic    Lack of Transportation (Medical): Yes  Lack of Transportation (Non-Medical): Yes  Physical Activity: Not on file  Stress: Not on file  Social Connections: Unknown (10/17/2024)   Social Connection and Isolation Panel    Frequency of Communication with Friends and Family: Patient unable to answer    Frequency of Social Gatherings with Friends and Family: Patient unable to answer    Attends Religious Services: Patient unable to answer    Active Member of Clubs or Organizations: Patient declined    Attends Banker Meetings: Patient unable to answer    Marital Status: Widowed  Depression (PHQ2-9): Not on  file  Alcohol Screen: Low Risk (10/17/2024)   Alcohol Screen    Last Alcohol Screening Score (AUDIT): 0  Housing: Low Risk (10/17/2024)   Epic    Unable to Pay for Housing in the Last Year: No    Number of Times Moved in the Last Year: 0    Homeless in the Last Year: No  Utilities: Not At Risk (10/17/2024)   Epic    Threatened with loss of utilities: No  Health Literacy: Not on file   Past Medical History:  Past Medical History:  Diagnosis Date   Eye globe prosthesis    GERD (gastroesophageal reflux disease)    History of blood transfusion 1973   related to abscess burst in my stomach   Hyperlipemia    Hyperlipidemia    Hypertension    Osteoarthritis of back    Lowerback    SVD (spontaneous vaginal delivery)    x 1, baby died at 2 wks of age   Type II diabetes mellitus (HCC)     Past Surgical History:  Procedure Laterality Date   APPENDECTOMY     COLONOSCOPY     DILATATION & CURETTAGE/HYSTEROSCOPY WITH MYOSURE N/A 02/25/2015   Procedure: DILATATION & CURETTAGE/HYSTEROSCOPY WITH MYOSURE;  Surgeon: Truman Corona, MD;  Location: [facility];  Service: Gynecology;  Laterality: N/A;   DILATION AND CURETTAGE OF UTERUS     ENUCLEATION Right 03/16/2017   ENUCLEATION Right 03/16/2017   Procedure: ENUCLEATION RIGHT EYE;  Surgeon: Loyd Kathryne Palm, MD;  Location: [facility] OR;  Service: Ophthalmology;  Laterality: Right;   EYE SURGERY     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6, 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   EYE SURGERY Right ~ 1974   S/P initial eye injury; scissors stuck in my eye   LAPAROSCOPIC CHOLECYSTECTOMY     SHOULDER ARTHROSCOPY WITH ROTATOR CUFF REPAIR Left    wire stitches per patient   SHOULDER ARTHROSCOPY WITH ROTATOR CUFF REPAIR Right     Current Medications: Current Facility-Administered Medications  Medication Dose Route Frequency Provider Last Rate Last Admin   acetaminophen  (TYLENOL ) tablet 650 mg  650 mg Oral Q6H PRN Tex Drilling, NP   650 mg at 12/07/24 2126   alum & mag  hydroxide-simeth (MAALOX/MYLANTA) 200-200-20 MG/5ML suspension 30 mL  30 mL Oral Q4H PRN Nkwenti, Drilling, NP       cloNIDine  (CATAPRES ) tablet 0.1 mg  0.1 mg Oral BID PRN Tex Drilling, NP       divalproex  (DEPAKOTE ) DR tablet 1,500 mg  1,500 mg Oral QHS Kerrigan Gombos, MD   1,500 mg at 12/07/24 2126   divalproex  (DEPAKOTE ) DR tablet 500 mg  500 mg Oral q AM Adonte Vanriper, MD   500 mg at 12/08/24 9192   losartan  (COZAAR ) tablet 25 mg  25 mg Oral Daily Nkwenti, Doris, NP   25 mg at 12/08/24 9071   magnesium  hydroxide (MILK OF  MAGNESIA) suspension 30 mL  30 mL Oral Daily PRN Tex Drilling, NP       menthol  (CEPACOL) lozenge 3 mg  1 lozenge Oral PRN Bobbitt, Shalon E, NP   3 mg at 12/08/24 9065   methimazole  (TAPAZOLE ) tablet 10 mg  10 mg Oral Daily Nkwenti, Doris, NP   10 mg at 12/08/24 9071   OLANZapine  (ZYPREXA ) injection 5 mg  5 mg Intramuscular TID PRN Tex Drilling, NP       OLANZapine  (ZYPREXA ) injection 5 mg  5 mg Intramuscular TID PRN Tex Drilling, NP       OLANZapine  (ZYPREXA ) tablet 20 mg  20 mg Oral QHS Rayman Petrosian, MD   20 mg at 12/07/24 2127   OLANZapine  zydis (ZYPREXA ) disintegrating tablet 5 mg  5 mg Oral TID PRN Tex Drilling, NP       OLANZapine  zydis (ZYPREXA ) disintegrating tablet 5 mg  5 mg Oral TID PRN Tex Drilling, NP       paliperidone  (INVEGA ) 24 hr tablet 3 mg  3 mg Oral Daily Shrivastava, Aryendra, MD   3 mg at 12/08/24 9071   pantoprazole  (PROTONIX ) EC tablet 40 mg  40 mg Oral Daily Nkwenti, Doris, NP   40 mg at 12/08/24 9071   traZODone  (DESYREL ) tablet 50 mg  50 mg Oral QHS PRN Tex Drilling, NP   50 mg at 12/06/24 2109    Lab Results:  No results found for this or any previous visit (from the past 48 hours).       Blood Alcohol level:  Lab Results  Component Value Date   [facility] <15 10/14/2024   ETH <10 07/02/2022    Metabolic Disorder Labs: Lab Results  Component Value Date   HGBA1C 5.5 10/14/2024   MPG 111.15 10/14/2024   MPG 137  01/06/2022   Lab Results  Component Value Date   PROLACTIN 5.8 10/14/2024   Lab Results  Component Value Date   CHOL 167 10/14/2024   TRIG 68 10/14/2024   HDL 60 10/14/2024   CHOLHDL 2.8 10/14/2024   VLDL 14 10/14/2024   LDLCALC 93 10/14/2024   LDLCALC 73 03/13/2024    Physical Findings: AIMS:  , ,  ,  ,    CIWA:    COWS:      Psychiatric Specialty Exam:  Presentation  General Appearance:  Appropriate for Environment  Eye Contact: Fleeting  Speech: Normal Rate  Speech Volume: Decreased    Mood and Affect  Mood:fine  Affect: Flat   Thought Process  Thought Processes: impoverished  Orientation:Partial  Thought Content: Chronic delusions Hallucinations: Denies  Ideas of Reference:Delusions; Paranoia  Suicidal Thoughts: Denies  Homicidal Thoughts: Denies   Sensorium  Memory: Immediate Fair; Recent Fair  Judgment: Impaired  Insight: Shallow   Executive Functions  Concentration: Fair  Attention Span: Fair  Recall: Poor  Fund of Knowledge: Fair  Language: Fair   Psychomotor Activity  Psychomotor Activity: No data recorded  Musculoskeletal: Strength & Muscle Tone: within normal limits Gait & Station: normal Assets  Assets: Manufacturing Systems Engineer; Desire for Improvement; Social Support    Physical Exam: Physical Exam Vitals and nursing note reviewed.    ROS Blood pressure (!) 152/69, pulse 98, temperature 98.3 F (36.8 C), resp. rate 18, height 5' 8 (1.727 m), weight 48.3 kg, SpO2 100%. Body mass index is 16.19 kg/m.  Diagnosis: Principal Problem:   Schizophrenia, paranoid (HCC) Major neurocognitive disorder-slums total score of 13/30   Treatment Plan Summary: APS referral has been  made as patient lacks capacity to make medical decisions.  Recommending legal guardianship and further placement at appropriate level of care  Safety and Monitoring:             -- Involuntary admission to inpatient psychiatric  unit for safety, stabilization and treatment             -- Daily contact with patient to assess and evaluate symptoms and progress in treatment             -- Patient's case to be discussed in multi-disciplinary team meeting             -- Observation Level: q15 minute checks             -- Vital signs:  q12 hours             -- Precautions: suicide, elopement, and assault   2. Psychiatric Diagnoses and Treatment:  Check Depakote  levels 11/20/2024-60 Invega  at 3 mg-plan to titrate it up after medical clearence           Depakote  changed to 500 mg every morning and 1500 mg nightly-in next 5 days 11/13/2024 Depakote  level-44 Zyprexa  20 mg nightly BMP-within normal limits Occupational Therapy evaluation for assessing appropriate level of care -- The risks/benefits/side-effects/alternatives to this medication were discussed in detail with the patient and time was given for questions. The patient consents to medication trial.                -- Metabolic profile and EKG monitoring obtained while on an atypical antipsychotic (BMI: Lipid Panel: HbgA1c: QTc:)              -- Encouraged patient to participate in unit milieu and in scheduled group therapies   Occupational Therapy recommendations If plan is discharge home, recommend the following:   Direct supervision/assist for medications management;Direct supervision/assist for financial management;Assist for transportation;Assistance with cooking/housework    Ms Chacko was seen for OT treatment on this date. Upon arrival to room pt in dayroom, agreeable to tx. The SLUMS is a 30-point screening questionnaire that tests orientation, memory, attention, problem solving, and executive function. Pt scored a 13/30 indicating Dementia. Of note, it is not within occupational therapy scope of practice to diagnose cognitive impairments, this screen indicates need for further testing. Pt with noted impairments in memory and problem solving limiting ability to  participate functionally in medication management, bill management, and safety cooking. Will continue to follow POC. Continue to recommend SUPERVISION for safety with iADLs.    [facility] Mental Status Examination Orientation: 2/3  Calculations: 0/3 Naming animals: 2/3 Patient named 14 animals (0 points is 0-4 animals; 1 is 5-9 animals; 2 is 10-14 animals; 3 is 15+ animals) Recall: 3/5  Attention: 1/2 Clock drawing: 1/4 Visual Processing: 2/2 Paragraph Memory: 2/8  Total: 13/30;  Given that patient has completed high school, this score falls in the dementia range.   4. Discharge Planning:   -- Social work and case management to assist with discharge planning and identification of hospital follow-up needs prior to discharge  -- Estimated LOS: 3-4 days  Millie JONELLE Manners, MD 12/08/2024, 5:46 PM

## 2024-12-12 NOTE — Consult Note (Addendum)
 Initial Consultation Note   Patient: Shari [name] FMW:[id number] DOB: May 31, 1956 PCP: Jerome Heron Ruth, PA-C DOA: 10/17/2024 DOS: the patient was seen and examined on 12/12/2024 Primary service: Donnelly Mellow, MD  Referring physician: Dr. Donnelly Reason for consult: Chest pain  Assessment/Plan:  Noncardiac chest pain - Likely secondary to uncontrolled GERD. - Increase PPI to twice daily - Add any Carafate  every 6 hours -Stay upright for at least 1 hour after each meal - Other DDx, patient also has significant tenderness on palpation over lower sternum with concern about skeletal muscular pain such as costochondritis, we will try lidocaine  patch as well. - Given the features of chest pain, has very low concern about angina.  Yesterday EKG showed ST inversions on lateral leads and nonspecific ST changes on V1 through V3, and inferior leads most likely compatible with chronic changes.  Hyperthyroidism Graves' disease - Patient has been noncompliant with methimazole , check her MAR which showed patient has been consistently taking thyroid  medication since November 3, and most recent TSH uptrending 0.005> 0.1, recommend recheck TSH in about 3 to 4 weeks.  Continue current methimazole  dosage.  Chronic HFrEF -Most recent echo showed LVEF 40-45%, no focal wall motion abnormalities.  Cardiology recommended outpatient follow-up. -Currently patient appears to be euvolemic - Continue Inderal    TRH will sign off at present, please call us  again when needed.  HPI: Shari [name] is a 69 y.o. female with past medical history of GERD, Graves' disease, HTN, HLD, homeless, nonadherent with home medications, has been complaining about persistent burning-like chest pain for last 2+ weeks.  Patient has been having burning-like sensation in the middle chest, worsening with certain food such as hot and/or spicy food, the pain can be lingered on for few hours.  Denies any shortness of breath palpitations  lightheadedness.  She has been taking PPI.  She denied any trouble swallowing, no weight loss, no anemia.  Review of Systems: As mentioned in the history of present illness. All other systems reviewed and are negative. Past Medical History:  Diagnosis Date   Eye globe prosthesis    GERD (gastroesophageal reflux disease)    History of blood transfusion 1973   related to abscess burst in my stomach   Hyperlipemia    Hyperlipidemia    Hypertension    Osteoarthritis of back    Lowerback    SVD (spontaneous vaginal delivery)    x 1, baby died at 2 wks of age   Type II diabetes mellitus (HCC)    Past Surgical History:  Procedure Laterality Date   APPENDECTOMY     COLONOSCOPY     DILATATION & CURETTAGE/HYSTEROSCOPY WITH MYOSURE N/A 02/25/2015   Procedure: DILATATION & CURETTAGE/HYSTEROSCOPY WITH MYOSURE;  Surgeon: Truman Corona, MD;  Location: [facility];  Service: Gynecology;  Laterality: N/A;   DILATION AND CURETTAGE OF UTERUS     ENUCLEATION Right 03/16/2017   ENUCLEATION Right 03/16/2017   Procedure: ENUCLEATION RIGHT EYE;  Surgeon: Loyd Kathryne Palm, MD;  Location: [facility] OR;  Service: Ophthalmology;  Laterality: Right;   EYE SURGERY     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6, 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   EYE SURGERY Right ~ 1974   S/P initial eye injury; scissors stuck in my eye   LAPAROSCOPIC CHOLECYSTECTOMY     SHOULDER ARTHROSCOPY WITH ROTATOR CUFF REPAIR Left    wire stitches per patient   SHOULDER ARTHROSCOPY WITH ROTATOR CUFF REPAIR Right    Social History:  reports that she has been  smoking cigarettes. She has a 20 pack-year smoking history. She has never used smokeless tobacco. She reports current alcohol use. She reports that she does not use drugs.  Allergies[1]  Family History  Problem Relation Age of Onset   Diabetes Mother    CAD Mother    Lung cancer Father     Prior to Admission medications  Medication Sig Start Date End Date Taking? Authorizing Provider  Ascorbic Acid (VITAMIN C  PO) Take 1 tablet by mouth daily. Patient not taking: Reported on 10/14/2024    [provider]  divalproex  (DEPAKOTE ) 500 MG DR tablet Take 1 tablet (500 mg total) by mouth at bedtime. Patient not taking: Reported on 10/14/2024 03/16/24 10/14/24  Sebastian Toribio GAILS, MD  losartan  (COZAAR ) 25 MG tablet Take 1 tablet (25 mg total) by mouth daily. Patient not taking: Reported on 10/14/2024 03/16/24   Sebastian Toribio GAILS, MD  methimazole  (TAPAZOLE ) 10 MG tablet Take 1 tablet (10 mg total) by mouth daily. Patient not taking: Reported on 10/14/2024 03/16/24   Sebastian Toribio GAILS, MD  OLANZapine  (ZYPREXA ) 10 MG tablet Take 1 tablet (10 mg total) by mouth at bedtime. Patient not taking: Reported on 10/14/2024 03/16/24   Sebastian Toribio GAILS, MD  pantoprazole  (PROTONIX ) 40 MG tablet Take 1 tablet (40 mg total) by mouth daily. Patient not taking: Reported on 10/14/2024 03/16/24   Sebastian Toribio GAILS, MD  propranolol  (INDERAL ) 10 MG tablet Take 1 tablet (10 mg total) by mouth 2 (two) times daily. Patient not taking: Reported on 10/14/2024 03/16/24   Sebastian Toribio GAILS, MD  VITAMIN D PO Take 1 tablet by mouth daily. Patient not taking: Reported on 10/14/2024    [provider]  VITAMIN E PO Take 1 tablet by mouth daily. Patient not taking: Reported on 10/14/2024    [provider]    Physical Exam: Vitals:   12/10/24 2006 12/11/24 0738 12/11/24 1921 12/12/24 0728  BP: 131/76 132/77 129/67 125/76  Pulse: 77 75 80 84  Resp:  17  16  Temp: 98.1 F (36.7 C) (!) 97.1 F (36.2 C) 98.4 F (36.9 C) 98.2 F (36.8 C)  TempSrc:      SpO2: 97% 97% 99% 98%  Weight:      Height:       Eyes: PERRL, lids and conjunctivae normal ENMT: Mucous membranes are moist. Posterior pharynx clear of any exudate or lesions.Normal dentition.  Neck: normal, supple, no masses, no thyromegaly Respiratory: clear to auscultation bilaterally, no wheezing, no crackles. Normal respiratory effort. No accessory  muscle use.  Cardiovascular: Regular rate and rhythm, no murmurs / rubs / gallops. No extremity edema. 2+ pedal pulses. No carotid bruits.  Abdomen: no tenderness, no masses palpated. No hepatosplenomegaly. Bowel sounds positive.  Musculoskeletal: no clubbing / cyanosis. No joint deformity upper and lower extremities. Good ROM, no contractures. Normal muscle tone.  Skin: no rashes, lesions, ulcers. No induration Neurologic: CN 2-12 grossly intact. Sensation intact, DTR normal.  Muscle strength 5/5 on both sides Psychiatric: Normal judgment and insight. Alert and oriented x 3. Normal mood.    Data Reviewed:  Recent hospital record reviewed including HPI and discharge summary from last admission  Family Communication: None Primary team communication: Psychiatry team Thank you very much for involving us  in the care of your patient.  Author: Cort ONEIDA Mana, MD 12/12/2024 2:29 PM  For on call review [email or web address].      [1]  Allergies Allergen Reactions   Aspirin Hives and Itching  Chocolate Hives and Itching   Cocoa Itching and Rash   Penicillins Hives, Itching, Swelling and Other (See Comments)    Has patient had a PCN reaction causing IMMEDIATE RASH, FACIAL/TONGUE/THROAT SWELLING, SOB, OR LIGHTHEADEDNESS WITH HYPOTENSION:  #  #  #  YES  #  #  #  Has patient had a PCN reaction causing severe rash involving mucus membranes or skin necrosis: No Has patient had a PCN reaction that required hospitalization No Has patient had a PCN reaction occurring within the last 10 years: No If all of the above answers are NO, then may proceed with Cephalosporin use.    Sulfa Antibiotics Hives and Itching

## 2024-12-12 NOTE — Group Note (Signed)
 Recreation Therapy Group Note   Group Topic:Leisure Education  Group Date: 12/12/2024 Start Time: 1415 End Time: 1500 Facilitators: Celestia Jeoffrey BRAVO, LRT, CTRS Location: Courtyard  Group Description: Outdoor Recreation. Patients had the option to play corn hole, ring toss, UNO, or listening to music while outside in the courtyard getting fresh air and sunlight.  LRT and patients discussed things that they enjoy doing in their free time outside of the hospital. LRT encouraged patients to drink water  after being active and getting their heart rate up.   Goal Area(s) Addressed: Patient will identify leisure interests.  Patient will practice healthy decision making. Patient will engage in recreation activity.   Affect/Mood: Appropriate and Flat   Participation Level: Moderate   Participation Quality: Independent   Behavior: Appropriate and Calm   Speech/Thought Process: Coherent   Insight: Fair   Judgement: Fair    Modes of Intervention: Education, Exploration, Guided Discussion, and Music   Patient Response to Interventions:  Receptive   Education Outcome:  In group clarification offered    Clinical Observations/Individualized Feedback: Shari [name] was mostly active in their participation of session activities and group discussion. Pt chose to listen to music and sit in the sun while in group.    Plan: Continue to engage patient in RT group sessions 2-3x/week.   Jeoffrey BRAVO Celestia, LRT, CTRS 12/12/2024 4:22 PM

## 2024-12-12 NOTE — Plan of Care (Signed)
   Problem: Education: Goal: Emotional status will improve Outcome: Progressing Goal: Mental status will improve Outcome: Progressing   Problem: Activity: Goal: Interest or engagement in activities will improve Outcome: Progressing

## 2024-12-13 DIAGNOSIS — F0390 Unspecified dementia without behavioral disturbance: Secondary | ICD-10-CM | POA: Diagnosis not present

## 2024-12-13 DIAGNOSIS — F200 Paranoid schizophrenia: Secondary | ICD-10-CM | POA: Diagnosis not present

## 2024-12-13 MED ORDER — DICLOFENAC SODIUM 1 % EX GEL: 4.0000 g | Freq: Four times a day (QID) | CUTANEOUS | Status: AC | PRN

## 2024-12-13 NOTE — Plan of Care (Signed)
   Problem: Coping: Goal: Ability to demonstrate self-control will improve Outcome: Progressing   Problem: Health Behavior/Discharge Planning: Goal: Compliance with treatment plan for underlying cause of condition will improve Outcome: Progressing

## 2024-12-13 NOTE — Group Note (Signed)
 Date:  12/13/2024 Time:  10:37 AM  Group Topic/Focus:  Coping With Mental Health Crisis:   The purpose of this group is to help patients identify strategies for coping with mental health crisis.  Group discusses possible causes of crisis and ways to manage them effectively.    Participation Level:  Active  Participation Quality:  Appropriate  Affect:  Appropriate  Cognitive:  Appropriate  Insight: Appropriate  Engagement in Group:  Engaged  Modes of Intervention:  Education  Additional Comments:  none  [name] 12/13/2024, 10:37 AM

## 2024-12-13 NOTE — Progress Notes (Signed)
 Behavior:  Pleasant and cooperative.     Psych assessment:  Denies SI/HI and AVH.    Group attendance:  1/ 2.  Medication/ PRNs: Compliant.   PRN Voltaren  gel ordered PRN for chest pain.  Pain: Denies.     15 min checks in place for safety.    Pt declined lidocaine  patch.  Pt reported it made her itchy.

## 2024-12-13 NOTE — Plan of Care (Signed)
  Problem: Activity: Goal: Interest or engagement in activities will improve Outcome: Progressing   Problem: Physical Regulation: Goal: Ability to maintain clinical measurements within normal limits will improve Outcome: Progressing   Problem: Safety: Goal: Periods of time without injury will increase Outcome: Progressing

## 2024-12-13 NOTE — Progress Notes (Signed)
 [facility] MD Progress Note   5:46 PM Shari [name]  MRN:  [id number] Patient is a 69 year old female with a history of Schizoaffective Disorder, Bipolar Type who presents via [employer] under IVC, initiated by CM, Falencio with re-entry program, to [facility] for assessment. Per IVC, [name] has been diagnosed with Schizophrenia and is refusing to take medication. [name] isn't sleeping. Residents report her screaming and yelling all night long.  [name] responding to internal stimuli.  [name] verbally aggressive.  The respondent has locked all of the residents inside of the transitional housing home.  They could not leave the home which prompted the IVC.  Residents did not have access to food, the restroom or anything as a result of being locked in.  [name] is currently under Adult protective services care. Patient is admitted to [facility] unit with Q15 min safety monitoring. Multidisciplinary team approach is offered. Medication management; group/milieu therapy is offered.   Subjective:  Chart reviewed, case discussed in multidisciplinary meeting, patient seen during rounds.  Patient is noted to be having lunch in the day room.  She reports lidocaine  patch helping her with the pain but is causing itching.  Patient agreed to try diclofenac  gel to alleviate the pain possibly due to costochondritis.  Hospitalist the patient and recommended lidocaine  patch for the chest discomfort and ruled out cardiac related pain.  Patient denies SI/HI/plan denies hallucinations.  She remains delusional about not needing legal guardian and her living situation stating she will be going back to the house and live by herself.  Per social work team APS case worker came by and updated the patient about the legal guardianship  Past Psychiatric History: see h&P Family History:  Family History  Problem Relation Age of Onset   Diabetes Mother    CAD Mother    Lung cancer Father    Social History:  Social  History   Substance and Sexual Activity  Alcohol Use Yes   Comment: beer occasionally     Social History   Substance and Sexual Activity  Drug Use No    Social History   Socioeconomic History   Marital status: Widowed    Spouse name: Not on file   Number of children: Not on file   Years of education: Not on file   Highest education level: Not on file  Occupational History   Not on file  Tobacco Use   Smoking status: Every Day    Current packs/day: 0.50    Average packs/day: 0.5 packs/day for 40.0 years (20.0 ttl pk-yrs)    Types: Cigarettes   Smokeless tobacco: Never  Vaping Use   Vaping status: Never Used  Substance and Sexual Activity   Alcohol use: Yes    Comment: beer occasionally   Drug use: No   Sexual activity: Not Currently    Birth control/protection: Post-menopausal  Other Topics Concern   Not on file  Social History Narrative   Not on file   Social Drivers of Health   Tobacco Use: High Risk (11/02/2024)   Patient History    Smoking Tobacco Use: Every Day    Smokeless Tobacco Use: Never    Passive Exposure: Not on file  Financial Resource Strain: Not on file  Food Insecurity: No Food Insecurity (10/17/2024)   Epic    Worried About Programme Researcher, Broadcasting/film/video in the Last Year: Never true    Ran Out of Food in the Last Year: Never true  Transportation  Needs: No Transportation Needs (10/17/2024)   Epic    Lack of Transportation (Medical): No    Lack of Transportation (Non-Medical): No  Recent Concern: Transportation Needs - Unmet Transportation Needs (10/14/2024)   Epic    Lack of Transportation (Medical): Yes    Lack of Transportation (Non-Medical): Yes  Physical Activity: Not on file  Stress: Not on file  Social Connections: Unknown (10/17/2024)   Social Connection and Isolation Panel    Frequency of Communication with Friends and Family: Patient unable to answer    Frequency of Social Gatherings with Friends and Family: Patient unable to answer     Attends Religious Services: Patient unable to answer    Active Member of Clubs or Organizations: Patient declined    Attends Banker Meetings: Patient unable to answer    Marital Status: Widowed  Depression (PHQ2-9): Not on file  Alcohol Screen: Low Risk (10/17/2024)   Alcohol Screen    Last Alcohol Screening Score (AUDIT): 0  Housing: Low Risk (10/17/2024)   Epic    Unable to Pay for Housing in the Last Year: No    Number of Times Moved in the Last Year: 0    Homeless in the Last Year: No  Utilities: Not At Risk (10/17/2024)   Epic    Threatened with loss of utilities: No  Health Literacy: Not on file   Past Medical History:  Past Medical History:  Diagnosis Date   Eye globe prosthesis    GERD (gastroesophageal reflux disease)    History of blood transfusion 1973   related to abscess burst in my stomach   Hyperlipemia    Hyperlipidemia    Hypertension    Osteoarthritis of back    Lowerback    SVD (spontaneous vaginal delivery)    x 1, baby died at 2 wks of age   Type II diabetes mellitus (HCC)     Past Surgical History:  Procedure Laterality Date   APPENDECTOMY     COLONOSCOPY     DILATATION & CURETTAGE/HYSTEROSCOPY WITH MYOSURE N/A 02/25/2015   Procedure: DILATATION & CURETTAGE/HYSTEROSCOPY WITH MYOSURE;  Surgeon: Truman Corona, MD;  Location: [facility];  Service: Gynecology;  Laterality: N/A;   DILATION AND CURETTAGE OF UTERUS     ENUCLEATION Right 03/16/2017   ENUCLEATION Right 03/16/2017   Procedure: ENUCLEATION RIGHT EYE;  Surgeon: Loyd Kathryne Palm, MD;  Location: [facility] OR;  Service: Ophthalmology;  Laterality: Right;   EYE SURGERY     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6, 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   EYE SURGERY Right ~ 1974   S/P initial eye injury; scissors stuck in my eye   LAPAROSCOPIC CHOLECYSTECTOMY     SHOULDER ARTHROSCOPY WITH ROTATOR CUFF REPAIR Left    wire stitches per patient   SHOULDER ARTHROSCOPY WITH ROTATOR CUFF REPAIR Right     Current  Medications: Current Facility-Administered Medications  Medication Dose Route Frequency Provider Last Rate Last Admin   acetaminophen  (TYLENOL ) tablet 650 mg  650 mg Oral Q6H PRN Tex Drilling, NP   650 mg at 12/07/24 2126   alum & mag hydroxide-simeth (MAALOX/MYLANTA) 200-200-20 MG/5ML suspension 30 mL  30 mL Oral Q4H PRN Tex Drilling, NP       cloNIDine  (CATAPRES ) tablet 0.1 mg  0.1 mg Oral BID PRN Tex Drilling, NP       divalproex  (DEPAKOTE ) DR tablet 1,500 mg  1,500 mg Oral QHS Mar Walmer, MD   1,500 mg at 12/07/24 2126   divalproex  (DEPAKOTE )  DR tablet 500 mg  500 mg Oral q AM Jorrell Kuster, MD   500 mg at 12/08/24 9192   losartan  (COZAAR ) tablet 25 mg  25 mg Oral Daily Nkwenti, Doris, NP   25 mg at 12/08/24 0928   magnesium  hydroxide (MILK OF MAGNESIA) suspension 30 mL  30 mL Oral Daily PRN Tex Drilling, NP       menthol  (CEPACOL) lozenge 3 mg  1 lozenge Oral PRN Bobbitt, Shalon E, NP   3 mg at 12/08/24 9065   methimazole  (TAPAZOLE ) tablet 10 mg  10 mg Oral Daily Nkwenti, Doris, NP   10 mg at 12/08/24 9071   OLANZapine  (ZYPREXA ) injection 5 mg  5 mg Intramuscular TID PRN Tex Drilling, NP       OLANZapine  (ZYPREXA ) injection 5 mg  5 mg Intramuscular TID PRN Tex Drilling, NP       OLANZapine  (ZYPREXA ) tablet 20 mg  20 mg Oral QHS Joncarlo Friberg, MD   20 mg at 12/07/24 2127   OLANZapine  zydis (ZYPREXA ) disintegrating tablet 5 mg  5 mg Oral TID PRN Tex Drilling, NP       OLANZapine  zydis (ZYPREXA ) disintegrating tablet 5 mg  5 mg Oral TID PRN Tex Drilling, NP       paliperidone  (INVEGA ) 24 hr tablet 3 mg  3 mg Oral Daily Shrivastava, Aryendra, MD   3 mg at 12/08/24 9071   pantoprazole  (PROTONIX ) EC tablet 40 mg  40 mg Oral Daily Nkwenti, Doris, NP   40 mg at 12/08/24 9071   traZODone  (DESYREL ) tablet 50 mg  50 mg Oral QHS PRN Tex Drilling, NP   50 mg at 12/06/24 2109    Lab Results:  No results found for this or any previous visit (from the past 48  hours).       Blood Alcohol level:  Lab Results  Component Value Date   [facility] <15 10/14/2024   ETH <10 07/02/2022    Metabolic Disorder Labs: Lab Results  Component Value Date   HGBA1C 5.5 10/14/2024   MPG 111.15 10/14/2024   MPG 137 01/06/2022   Lab Results  Component Value Date   PROLACTIN 5.8 10/14/2024   Lab Results  Component Value Date   CHOL 167 10/14/2024   TRIG 68 10/14/2024   HDL 60 10/14/2024   CHOLHDL 2.8 10/14/2024   VLDL 14 10/14/2024   LDLCALC 93 10/14/2024   LDLCALC 73 03/13/2024    Physical Findings: AIMS:  , ,  ,  ,    CIWA:    COWS:      Psychiatric Specialty Exam:  Presentation  General Appearance:  Appropriate for Environment  Eye Contact: Fleeting  Speech: Normal Rate  Speech Volume: Decreased    Mood and Affect  Mood:fine  Affect: Flat   Thought Process  Thought Processes: impoverished  Orientation:Partial  Thought Content: Chronic delusions Hallucinations: Denies  Ideas of Reference:Delusions; Paranoia  Suicidal Thoughts: Denies  Homicidal Thoughts: Denies   Sensorium  Memory: Immediate Fair; Recent Fair  Judgment: Impaired  Insight: Shallow   Executive Functions  Concentration: Fair  Attention Span: Fair  Recall: Poor  Fund of Knowledge: Fair  Language: Fair   Psychomotor Activity  Psychomotor Activity: No data recorded  Musculoskeletal: Strength & Muscle Tone: within normal limits Gait & Station: normal Assets  Assets: Manufacturing Systems Engineer; Desire for Improvement; Social Support    Physical Exam: Physical Exam Vitals and nursing note reviewed.    ROS Blood pressure (!) 152/69, pulse 98, temperature  98.3 F (36.8 C), resp. rate 18, height 5' 8 (1.727 m), weight 48.3 kg, SpO2 100%. Body mass index is 16.19 kg/m.  Diagnosis: Principal Problem:   Schizophrenia, paranoid (HCC) Major neurocognitive disorder-slums total score of 13/30   Treatment Plan Summary:  APS referral has been made as patient lacks capacity to make medical decisions.  Recommending legal guardianship and further placement at appropriate level of care  Safety and Monitoring:             -- Involuntary admission to inpatient psychiatric unit for safety, stabilization and treatment             -- Daily contact with patient to assess and evaluate symptoms and progress in treatment             -- Patient's case to be discussed in multi-disciplinary team meeting             -- Observation Level: q15 minute checks             -- Vital signs:  q12 hours             -- Precautions: suicide, elopement, and assault   2. Psychiatric Diagnoses and Treatment:  Check Depakote  levels 11/20/2024-60 Invega  at 3 mg-plan to titrate it up after medical clearence           Depakote  changed to 500 mg every morning and 1500 mg nightly-in next 5 days 11/13/2024 Depakote  level-44 Zyprexa  20 mg nightly BMP-within normal limits Occupational Therapy evaluation for assessing appropriate level of care -- The risks/benefits/side-effects/alternatives to this medication were discussed in detail with the patient and time was given for questions. The patient consents to medication trial.                -- Metabolic profile and EKG monitoring obtained while on an atypical antipsychotic (BMI: Lipid Panel: HbgA1c: QTc:)              -- Encouraged patient to participate in unit milieu and in scheduled group therapies   Occupational Therapy recommendations If plan is discharge home, recommend the following:   Direct supervision/assist for medications management;Direct supervision/assist for financial management;Assist for transportation;Assistance with cooking/housework    Ms Azpeitia was seen for OT treatment on this date. Upon arrival to room pt in dayroom, agreeable to tx. The SLUMS is a 30-point screening questionnaire that tests orientation, memory, attention, problem solving, and executive function. Pt scored a  13/30 indicating Dementia. Of note, it is not within occupational therapy scope of practice to diagnose cognitive impairments, this screen indicates need for further testing. Pt with noted impairments in memory and problem solving limiting ability to participate functionally in medication management, bill management, and safety cooking. Will continue to follow POC. Continue to recommend SUPERVISION for safety with iADLs.    [facility] Mental Status Examination Orientation: 2/3  Calculations: 0/3 Naming animals: 2/3 Patient named 14 animals (0 points is 0-4 animals; 1 is 5-9 animals; 2 is 10-14 animals; 3 is 15+ animals) Recall: 3/5  Attention: 1/2 Clock drawing: 1/4 Visual Processing: 2/2 Paragraph Memory: 2/8  Total: 13/30;  Given that patient has completed high school, this score falls in the dementia range.   4. Discharge Planning:   -- Social work and case management to assist with discharge planning and identification of hospital follow-up needs prior to discharge  -- Estimated LOS: 3-4 days  Millie JONELLE Manners, MD 12/08/2024, 5:46 PM

## 2024-12-13 NOTE — Progress Notes (Signed)
" °   12/12/24 1945  Psych Admission Type (Psych Patients Only)  Admission Status Involuntary  Psychosocial Assessment  Patient Complaints None  Eye Contact Fair  Facial Expression Flat  Affect Appropriate to circumstance  Speech Logical/coherent  Interaction Assertive  Motor Activity Slow  Appearance/Hygiene Unremarkable  Behavior Characteristics Cooperative;Appropriate to situation;Calm  Mood Pleasant  Thought Process  Coherency WDL  Content WDL  Delusions None reported or observed  Perception WDL  Hallucination None reported or observed  Judgment Impaired  Confusion Mild  Danger to Self  Current suicidal ideation? Denies  Agreement Not to Harm Self Yes  Description of Agreement Verbal  Danger to Others  Danger to Others None reported or observed    "

## 2024-12-13 NOTE — Group Note (Signed)
 Date:  12/13/2024 Time:  8:38 PM  Group Topic/Focus:  Identifying Needs:   The focus of this group is to help patients identify their personal needs that have been historically problematic and identify healthy behaviors to address their needs.    Participation Level:  Active  Participation Quality:  Appropriate  Affect:  Appropriate  Cognitive:  Appropriate  Insight: Good and None  Engagement in Group:  Engaged  Modes of Intervention:  Confrontation and Discussion  Additional Comments:    Romero Earnie Hope 12/13/2024, 8:38 PM

## 2024-12-13 NOTE — Group Note (Signed)
 Physical/Occupational Therapy Group Note  Group Topic: UE Therex   Group Date: 12/13/2024 Start Time: 1305 End Time: 1335 Facilitators: Clive Warren CROME, OT   Group Description: Group instructed in series of upper extremities exercises, aimed to promote strength, flexibility, range of motion and functional endurance.  Patients provided cuing for proper mechanics and proper pace of exercise; exercises adjusted as necessary for individualized patient needs.  Patient also engaged in cognitive components throughout session, working to integrate attention to task, command following, turn-taking and appropriate social interaction throughout session.  Allowed to ask questions as appropriate, and encouraged to identify specific exercises that they could complete independently outside of group sessions.  Therapeutic Goal(s):  Demonstrate appropriate performance of upper extremity exercises to promote strength, flexibility, range of motion and functional endurance Identify 2-3 specific upper extremity exercises to complete as home exercise program outside of group session  Individual Participation: Pt did not attend.  Participation Level: Did not attend   Participation Quality:   Behavior:   Speech/Thought Process:   Affect/Mood:   Insight:   Judgement:   Modes of Intervention:   Patient Response to Interventions:    Plan: Continue to engage patient in PT/OT groups 1 - 2x/week.  Christorpher Hisaw R., MPH, MS, OTR/L ascom [phone] 12/13/2024, 3:22 PM

## 2024-12-14 DIAGNOSIS — F0390 Unspecified dementia without behavioral disturbance: Secondary | ICD-10-CM | POA: Diagnosis not present

## 2024-12-14 DIAGNOSIS — F200 Paranoid schizophrenia: Secondary | ICD-10-CM | POA: Diagnosis not present

## 2024-12-14 NOTE — Progress Notes (Signed)
" °   12/14/24 0808  Psych Admission Type (Psych Patients Only)  Admission Status Involuntary  Psychosocial Assessment  Patient Complaints None  Eye Contact Fair  Facial Expression Animated  Affect Appropriate to circumstance  Speech Logical/coherent  Interaction Minimal  Motor Activity Slow  Appearance/Hygiene Unremarkable  Behavior Characteristics Cooperative;Appropriate to situation  Mood Pleasant  Thought Process  Coherency WDL  Content WDL  Delusions None reported or observed  Perception WDL  Hallucination None reported or observed  Judgment Limited  Confusion Mild  Danger to Self  Current suicidal ideation? Denies  Danger to Others  Danger to Others None reported or observed    "

## 2024-12-14 NOTE — Group Note (Signed)
 Date:  12/14/2024 Time:  10:07 PM  Group Topic/Focus:  Healthy Communication:   The focus of this group is to discuss communication, barriers to communication, as well as healthy ways to communicate with others.    Participation Level:  Active  Participation Quality:  Appropriate  Affect:  Appropriate  Cognitive:  Appropriate  Insight: Good  Engagement in Group:  Engaged  Modes of Intervention:  Discussion  Additional Comments:    Shari [name] 12/14/2024, 10:07 PM

## 2024-12-14 NOTE — Group Note (Signed)
 Date:  12/14/2024 Time:  3:53 PM  Group Topic/Focus:  Managing Feelings:   The focus of this group is to identify what feelings patients have difficulty handling and develop a plan to handle them in a healthier way upon discharge.    Participation Level:  Active  Participation Quality:  Appropriate  Affect:  Appropriate  Cognitive:  Appropriate  Insight: Appropriate  Engagement in Group:  Engaged  Modes of Intervention:  Discussion   [name] 12/14/2024, 3:53 PM

## 2024-12-14 NOTE — Progress Notes (Signed)
 [facility] MD Progress Note   5:46 PM Shari [name]  MRN:  [id number] Patient is a 69 year old female with a history of Schizoaffective Disorder, Bipolar Type who presents via [employer] under IVC, initiated by CM, Falencio with re-entry program, to [facility] for assessment. Per IVC, [name] has been diagnosed with Schizophrenia and is refusing to take medication. [name] isn't sleeping. Residents report her screaming and yelling all night long.  [name] responding to internal stimuli.  [name] verbally aggressive.  The respondent has locked all of the residents inside of the transitional housing home.  They could not leave the home which prompted the IVC.  Residents did not have access to food, the restroom or anything as a result of being locked in.  [name] is currently under Adult protective services care. Patient is admitted to [facility] unit with Q15 min safety monitoring. Multidisciplinary team approach is offered. Medication management; group/milieu therapy is offered.   Subjective:  Chart reviewed, case discussed in multidisciplinary meeting, patient seen during rounds.   Patient is noted to be sitting in the day area.  She offers no complaints.  Since she removed the lidocaine  patch she is able to sleep better.  She denies having any chest discomfort.  She reports taking her medications and tolerating them well.  She denies auditory/visual hallucinations.  She denies feeling depressed or sad and denies any panic attacks.  She denies SI/HI/plan.  She remains focused on discharge planning.      Past Psychiatric History: see h&P Family History:  Family History  Problem Relation Age of Onset   Diabetes Mother    CAD Mother    Lung cancer Father    Social History:  Social History   Substance and Sexual Activity  Alcohol Use Yes   Comment: beer occasionally     Social History   Substance and Sexual Activity  Drug Use No    Social History   Socioeconomic History    Marital status: Widowed    Spouse name: Not on file   Number of children: Not on file   Years of education: Not on file   Highest education level: Not on file  Occupational History   Not on file  Tobacco Use   Smoking status: Every Day    Current packs/day: 0.50    Average packs/day: 0.5 packs/day for 40.0 years (20.0 ttl pk-yrs)    Types: Cigarettes   Smokeless tobacco: Never  Vaping Use   Vaping status: Never Used  Substance and Sexual Activity   Alcohol use: Yes    Comment: beer occasionally   Drug use: No   Sexual activity: Not Currently    Birth control/protection: Post-menopausal  Other Topics Concern   Not on file  Social History Narrative   Not on file   Social Drivers of Health   Tobacco Use: High Risk (11/02/2024)   Patient History    Smoking Tobacco Use: Every Day    Smokeless Tobacco Use: Never    Passive Exposure: Not on file  Financial Resource Strain: Not on file  Food Insecurity: No Food Insecurity (10/17/2024)   Epic    Worried About Programme Researcher, Broadcasting/film/video in the Last Year: Never true    Ran Out of Food in the Last Year: Never true  Transportation Needs: No Transportation Needs (10/17/2024)   Epic    Lack of Transportation (Medical): No    Lack of Transportation (Non-Medical): No  Recent Concern: Transportation Needs -  Unmet Transportation Needs (10/14/2024)   Epic    Lack of Transportation (Medical): Yes    Lack of Transportation (Non-Medical): Yes  Physical Activity: Not on file  Stress: Not on file  Social Connections: Unknown (10/17/2024)   Social Connection and Isolation Panel    Frequency of Communication with Friends and Family: Patient unable to answer    Frequency of Social Gatherings with Friends and Family: Patient unable to answer    Attends Religious Services: Patient unable to answer    Active Member of Clubs or Organizations: Patient declined    Attends Banker Meetings: Patient unable to answer    Marital Status:  Widowed  Depression (PHQ2-9): Not on file  Alcohol Screen: Low Risk (10/17/2024)   Alcohol Screen    Last Alcohol Screening Score (AUDIT): 0  Housing: Low Risk (10/17/2024)   Epic    Unable to Pay for Housing in the Last Year: No    Number of Times Moved in the Last Year: 0    Homeless in the Last Year: No  Utilities: Not At Risk (10/17/2024)   Epic    Threatened with loss of utilities: No  Health Literacy: Not on file   Past Medical History:  Past Medical History:  Diagnosis Date   Eye globe prosthesis    GERD (gastroesophageal reflux disease)    History of blood transfusion 1973   related to abscess burst in my stomach   Hyperlipemia    Hyperlipidemia    Hypertension    Osteoarthritis of back    Lowerback    SVD (spontaneous vaginal delivery)    x 1, baby died at 2 wks of age   Type II diabetes mellitus (HCC)     Past Surgical History:  Procedure Laterality Date   APPENDECTOMY     COLONOSCOPY     DILATATION & CURETTAGE/HYSTEROSCOPY WITH MYOSURE N/A 02/25/2015   Procedure: DILATATION & CURETTAGE/HYSTEROSCOPY WITH MYOSURE;  Surgeon: Truman Corona, MD;  Location: [facility];  Service: Gynecology;  Laterality: N/A;   DILATION AND CURETTAGE OF UTERUS     ENUCLEATION Right 03/16/2017   ENUCLEATION Right 03/16/2017   Procedure: ENUCLEATION RIGHT EYE;  Surgeon: Loyd Kathryne Palm, MD;  Location: [facility] OR;  Service: Ophthalmology;  Laterality: Right;   EYE SURGERY     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6, 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   EYE SURGERY Right ~ 1974   S/P initial eye injury; scissors stuck in my eye   LAPAROSCOPIC CHOLECYSTECTOMY     SHOULDER ARTHROSCOPY WITH ROTATOR CUFF REPAIR Left    wire stitches per patient   SHOULDER ARTHROSCOPY WITH ROTATOR CUFF REPAIR Right     Current Medications: Current Facility-Administered Medications  Medication Dose Route Frequency Provider Last Rate Last Admin   acetaminophen  (TYLENOL ) tablet 650 mg  650 mg Oral Q6H PRN Tex Drilling, NP   650 mg at  12/07/24 2126   alum & mag hydroxide-simeth (MAALOX/MYLANTA) 200-200-20 MG/5ML suspension 30 mL  30 mL Oral Q4H PRN Nkwenti, Drilling, NP       cloNIDine  (CATAPRES ) tablet 0.1 mg  0.1 mg Oral BID PRN Tex Drilling, NP       divalproex  (DEPAKOTE ) DR tablet 1,500 mg  1,500 mg Oral QHS Jivan Symanski, MD   1,500 mg at 12/07/24 2126   divalproex  (DEPAKOTE ) DR tablet 500 mg  500 mg Oral q AM Reily Treloar, MD   500 mg at 12/08/24 0807   losartan  (COZAAR ) tablet 25 mg  25 mg  Oral Daily Tex Drilling, NP   25 mg at 12/08/24 9071   magnesium  hydroxide (MILK OF MAGNESIA) suspension 30 mL  30 mL Oral Daily PRN Tex Drilling, NP       menthol  (CEPACOL) lozenge 3 mg  1 lozenge Oral PRN Bobbitt, Shalon E, NP   3 mg at 12/08/24 9065   methimazole  (TAPAZOLE ) tablet 10 mg  10 mg Oral Daily Nkwenti, Doris, NP   10 mg at 12/08/24 9071   OLANZapine  (ZYPREXA ) injection 5 mg  5 mg Intramuscular TID PRN Tex Drilling, NP       OLANZapine  (ZYPREXA ) injection 5 mg  5 mg Intramuscular TID PRN Tex Drilling, NP       OLANZapine  (ZYPREXA ) tablet 20 mg  20 mg Oral QHS Beautiful Pensyl, MD   20 mg at 12/07/24 2127   OLANZapine  zydis (ZYPREXA ) disintegrating tablet 5 mg  5 mg Oral TID PRN Tex Drilling, NP       OLANZapine  zydis (ZYPREXA ) disintegrating tablet 5 mg  5 mg Oral TID PRN Tex Drilling, NP       paliperidone  (INVEGA ) 24 hr tablet 3 mg  3 mg Oral Daily Shrivastava, Aryendra, MD   3 mg at 12/08/24 9071   pantoprazole  (PROTONIX ) EC tablet 40 mg  40 mg Oral Daily Nkwenti, Doris, NP   40 mg at 12/08/24 9071   traZODone  (DESYREL ) tablet 50 mg  50 mg Oral QHS PRN Tex Drilling, NP   50 mg at 12/06/24 2109    Lab Results:  No results found for this or any previous visit (from the past 48 hours).       Blood Alcohol level:  Lab Results  Component Value Date   [facility] <15 10/14/2024   ETH <10 07/02/2022    Metabolic Disorder Labs: Lab Results  Component Value Date   HGBA1C 5.5 10/14/2024   MPG 111.15  10/14/2024   MPG 137 01/06/2022   Lab Results  Component Value Date   PROLACTIN 5.8 10/14/2024   Lab Results  Component Value Date   CHOL 167 10/14/2024   TRIG 68 10/14/2024   HDL 60 10/14/2024   CHOLHDL 2.8 10/14/2024   VLDL 14 10/14/2024   LDLCALC 93 10/14/2024   LDLCALC 73 03/13/2024    Physical Findings: AIMS:  , ,  ,  ,    CIWA:    COWS:      Psychiatric Specialty Exam:  Presentation  General Appearance:  Appropriate for Environment  Eye Contact: Fleeting  Speech: Normal Rate  Speech Volume: Decreased    Mood and Affect  Mood:fine  Affect: Flat   Thought Process  Thought Processes: impoverished  Orientation:Partial  Thought Content: Chronic delusions Hallucinations: Denies  Ideas of Reference:Delusions; Paranoia  Suicidal Thoughts: Denies  Homicidal Thoughts: Denies   Sensorium  Memory: Immediate Fair; Recent Fair  Judgment: Impaired  Insight: Shallow   Executive Functions  Concentration: Fair  Attention Span: Fair  Recall: Poor  Fund of Knowledge: Fair  Language: Fair   Psychomotor Activity  Psychomotor Activity: No data recorded  Musculoskeletal: Strength & Muscle Tone: within normal limits Gait & Station: normal Assets  Assets: Manufacturing Systems Engineer; Desire for Improvement; Social Support    Physical Exam: Physical Exam Vitals and nursing note reviewed.    ROS Blood pressure (!) 152/69, pulse 98, temperature 98.3 F (36.8 C), resp. rate 18, height 5' 8 (1.727 m), weight 48.3 kg, SpO2 100%. Body mass index is 16.19 kg/m.  Diagnosis: Principal Problem:   Schizophrenia,  paranoid (HCC) Major neurocognitive disorder-slums total score of 13/30   Treatment Plan Summary: APS referral has been made as patient lacks capacity to make medical decisions.  Recommending legal guardianship and further placement at appropriate level of care  Safety and Monitoring:             -- Involuntary admission to  inpatient psychiatric unit for safety, stabilization and treatment             -- Daily contact with patient to assess and evaluate symptoms and progress in treatment             -- Patient's case to be discussed in multi-disciplinary team meeting             -- Observation Level: q15 minute checks             -- Vital signs:  q12 hours             -- Precautions: suicide, elopement, and assault   2. Psychiatric Diagnoses and Treatment:  Check Depakote  levels 11/20/2024-60 Invega  at 3 mg-plan to titrate it up after medical clearence           Depakote  changed to 500 mg every morning and 1500 mg nightly-in next 5 days 11/13/2024 Depakote  level-44 Zyprexa  20 mg nightly BMP-within normal limits Occupational Therapy evaluation for assessing appropriate level of care -- The risks/benefits/side-effects/alternatives to this medication were discussed in detail with the patient and time was given for questions. The patient consents to medication trial.                -- Metabolic profile and EKG monitoring obtained while on an atypical antipsychotic (BMI: Lipid Panel: HbgA1c: QTc:)              -- Encouraged patient to participate in unit milieu and in scheduled group therapies   Occupational Therapy recommendations If plan is discharge home, recommend the following:   Direct supervision/assist for medications management;Direct supervision/assist for financial management;Assist for transportation;Assistance with cooking/housework    Ms Colquhoun was seen for OT treatment on this date. Upon arrival to room pt in dayroom, agreeable to tx. The SLUMS is a 30-point screening questionnaire that tests orientation, memory, attention, problem solving, and executive function. Pt scored a 13/30 indicating Dementia. Of note, it is not within occupational therapy scope of practice to diagnose cognitive impairments, this screen indicates need for further testing. Pt with noted impairments in memory and problem solving  limiting ability to participate functionally in medication management, bill management, and safety cooking. Will continue to follow POC. Continue to recommend SUPERVISION for safety with iADLs.    [facility] Mental Status Examination Orientation: 2/3  Calculations: 0/3 Naming animals: 2/3 Patient named 14 animals (0 points is 0-4 animals; 1 is 5-9 animals; 2 is 10-14 animals; 3 is 15+ animals) Recall: 3/5  Attention: 1/2 Clock drawing: 1/4 Visual Processing: 2/2 Paragraph Memory: 2/8  Total: 13/30;  Given that patient has completed high school, this score falls in the dementia range.   4. Discharge Planning:   -- Social work and case management to assist with discharge planning and identification of hospital follow-up needs prior to discharge  -- Estimated LOS: 3-4 days  Millie JONELLE Manners, MD 12/08/2024, 5:46 PM

## 2024-12-14 NOTE — Plan of Care (Signed)
  Problem: Education: Goal: Mental status will improve Outcome: Progressing   

## 2024-12-14 NOTE — Plan of Care (Signed)
   Problem: Coping: Goal: Ability to verbalize frustrations and anger appropriately will improve Outcome: Progressing

## 2024-12-14 NOTE — Group Note (Signed)
 Date:  12/14/2024 Time:  3:00 PM  Group Topic/Focus:  Movement Therapy    Participation Level:  Did Not Attend    [name] 12/14/2024, 3:00 PM

## 2024-12-14 NOTE — BH IP Treatment Plan (Signed)
 Interdisciplinary Treatment and Diagnostic Plan Update  12/14/2024 Time of Session: 120pm Shari [name] MRN: [id number]  Principal Diagnosis: Schizophrenia, paranoid (HCC)  Secondary Diagnoses: Principal Problem:   Schizophrenia, paranoid (HCC)   Current Medications:  Current Facility-Administered Medications  Medication Dose Route Frequency Provider Last Rate Last Admin   acetaminophen  (TYLENOL ) tablet 650 mg  650 mg Oral Q6H PRN Tex Drilling, NP   650 mg at 12/14/24 0904   alum & mag hydroxide-simeth (MAALOX/MYLANTA) 200-200-20 MG/5ML suspension 30 mL  30 mL Oral Q4H PRN Tex Drilling, NP       cloNIDine  (CATAPRES ) tablet 0.1 mg  0.1 mg Oral BID PRN Tex Drilling, NP       diclofenac  Sodium (VOLTAREN ) 1 % topical gel 4 g  4 g Topical QID PRN Jadapalle, Sree, MD       divalproex  (DEPAKOTE ) DR tablet 1,500 mg  1,500 mg Oral QHS Jadapalle, Sree, MD   1,500 mg at 12/13/24 2207   divalproex  (DEPAKOTE ) DR tablet 500 mg  500 mg Oral q AM Jadapalle, Sree, MD   500 mg at 12/14/24 9195   famotidine  (PEPCID ) 40 MG/5ML suspension 40 mg  40 mg Oral Daily Jadapalle, Sree, MD   40 mg at 12/14/24 9095   lidocaine  (LIDODERM ) 5 % 1 patch  1 patch Transdermal Q24H Laurita Manor T, MD   1 patch at 12/14/24 1148   losartan  (COZAAR ) tablet 25 mg  25 mg Oral Daily Nkwenti, Doris, NP   25 mg at 12/14/24 0804   magnesium  hydroxide (MILK OF MAGNESIA) suspension 30 mL  30 mL Oral Daily PRN Tex Drilling, NP       menthol  (CEPACOL) lozenge 3 mg  1 lozenge Oral PRN Bobbitt, Shalon E, NP   3 mg at 12/12/24 2148   methimazole  (TAPAZOLE ) tablet 10 mg  10 mg Oral Daily Nkwenti, Doris, NP   10 mg at 12/14/24 9095   OLANZapine  (ZYPREXA ) injection 5 mg  5 mg Intramuscular TID PRN Tex Drilling, NP       OLANZapine  (ZYPREXA ) injection 5 mg  5 mg Intramuscular TID PRN Tex Drilling, NP       OLANZapine  (ZYPREXA ) tablet 20 mg  20 mg Oral QHS Jadapalle, Sree, MD   20 mg at 12/13/24 2206   OLANZapine  zydis (ZYPREXA )  disintegrating tablet 5 mg  5 mg Oral TID PRN Tex Drilling, NP       OLANZapine  zydis (ZYPREXA ) disintegrating tablet 5 mg  5 mg Oral TID PRN Tex Drilling, NP       paliperidone  (INVEGA ) 24 hr tablet 3 mg  3 mg Oral Daily Shrivastava, Aryendra, MD   3 mg at 12/14/24 0904   pantoprazole  (PROTONIX ) EC tablet 40 mg  40 mg Oral BID AC Zhang, Ping T, MD   40 mg at 12/14/24 0804   sucralfate  (CARAFATE ) 1 GM/10ML suspension 1 g  1 g Oral TID WC & HS Laurita Manor T, MD   1 g at 12/14/24 1146   traZODone  (DESYREL ) tablet 50 mg  50 mg Oral QHS PRN Tex Drilling, NP   50 mg at 12/11/24 2054   PTA Medications: Medications Prior to Admission  Medication Sig Dispense Refill Last Dose/Taking   Ascorbic Acid (VITAMIN C PO) Take 1 tablet by mouth daily. (Patient not taking: Reported on 10/14/2024)      divalproex  (DEPAKOTE ) 500 MG DR tablet Take 1 tablet (500 mg total) by mouth at bedtime. (Patient not taking: Reported on 10/14/2024) 30 tablet 1  losartan  (COZAAR ) 25 MG tablet Take 1 tablet (25 mg total) by mouth daily. (Patient not taking: Reported on 10/14/2024) 90 tablet 1    methimazole  (TAPAZOLE ) 10 MG tablet Take 1 tablet (10 mg total) by mouth daily. (Patient not taking: Reported on 10/14/2024) 90 tablet 1    OLANZapine  (ZYPREXA ) 10 MG tablet Take 1 tablet (10 mg total) by mouth at bedtime. (Patient not taking: Reported on 10/14/2024) 30 tablet 1    pantoprazole  (PROTONIX ) 40 MG tablet Take 1 tablet (40 mg total) by mouth daily. (Patient not taking: Reported on 10/14/2024) 90 tablet 0    propranolol  (INDERAL ) 10 MG tablet Take 1 tablet (10 mg total) by mouth 2 (two) times daily. (Patient not taking: Reported on 10/14/2024) 180 tablet 1    VITAMIN D PO Take 1 tablet by mouth daily. (Patient not taking: Reported on 10/14/2024)      VITAMIN E PO Take 1 tablet by mouth daily. (Patient not taking: Reported on 10/14/2024)       Patient Stressors: Medication change or noncompliance    Patient Strengths:  Communication skills   Treatment Modalities: Medication Management, Group therapy, Case management,  1 to 1 session with clinician, Psychoeducation, Recreational therapy.   Physician Treatment Plan for Primary Diagnosis: Schizophrenia, paranoid (HCC) Long Term Goal(s): Improvement in symptoms so as ready for discharge   Short Term Goals: Ability to identify changes in lifestyle to reduce recurrence of condition will improve Ability to verbalize feelings will improve Ability to disclose and discuss suicidal ideas Ability to demonstrate self-control will improve Ability to identify and develop effective coping behaviors will improve Ability to maintain clinical measurements within normal limits will improve  Medication Management: Evaluate patient's response, side effects, and tolerance of medication regimen.  Therapeutic Interventions: 1 to 1 sessions, Unit Group sessions and Medication administration.  Evaluation of Outcomes: Progressing  Physician Treatment Plan for Secondary Diagnosis: Principal Problem:   Schizophrenia, paranoid (HCC)  Long Term Goal(s): Improvement in symptoms so as ready for discharge   Short Term Goals: Ability to identify changes in lifestyle to reduce recurrence of condition will improve Ability to verbalize feelings will improve Ability to disclose and discuss suicidal ideas Ability to demonstrate self-control will improve Ability to identify and develop effective coping behaviors will improve Ability to maintain clinical measurements within normal limits will improve     Medication Management: Evaluate patient's response, side effects, and tolerance of medication regimen.  Therapeutic Interventions: 1 to 1 sessions, Unit Group sessions and Medication administration.  Evaluation of Outcomes: Progressing   RN Treatment Plan for Primary Diagnosis: Schizophrenia, paranoid (HCC) Long Term Goal(s): Knowledge of disease and therapeutic regimen to maintain  health will improve  Short Term Goals: Ability to remain free from injury will improve, Ability to verbalize frustration and anger appropriately will improve, Ability to demonstrate self-control, Ability to participate in decision making will improve, Ability to verbalize feelings will improve, Ability to disclose and discuss suicidal ideas, Ability to identify and develop effective coping behaviors will improve, and Compliance with prescribed medications will improve  Medication Management: RN will administer medications as ordered by provider, will assess and evaluate patient's response and provide education to patient for prescribed medication. RN will report any adverse and/or side effects to prescribing provider.  Therapeutic Interventions: 1 on 1 counseling sessions, Psychoeducation, Medication administration, Evaluate responses to treatment, Monitor vital signs and CBGs as ordered, Perform/monitor CIWA, COWS, AIMS and Fall Risk screenings as ordered, Perform wound care treatments as ordered.  Evaluation of Outcomes: Progressing   LCSW Treatment Plan for Primary Diagnosis: Schizophrenia, paranoid (HCC) Long Term Goal(s): Safe transition to appropriate next level of care at discharge, Engage patient in therapeutic group addressing interpersonal concerns.  Short Term Goals: Engage patient in aftercare planning with referrals and resources, Increase social support, Increase ability to appropriately verbalize feelings, Increase emotional regulation, Facilitate acceptance of mental health diagnosis and concerns, Facilitate patient progression through stages of change regarding substance use diagnoses and concerns, Identify triggers associated with mental health/substance abuse issues, and Increase skills for wellness and recovery  Therapeutic Interventions: Assess for all discharge needs, 1 to 1 time with Social worker, Explore available resources and support systems, Assess for adequacy in  community support network, Educate family and significant other(s) on suicide prevention, Complete Psychosocial Assessment, Interpersonal group therapy.  Evaluation of Outcomes: Progressing   Progress in Treatment: Attending groups: Yes.and no Participating in groups: Yes.and no Taking medication as prescribed: Yes. Toleration medication: Yes. Family/Significant other contact made: No, will contact:  once permission is granted Patient understands diagnosis: No. Discussing patient identified problems/goals with staff: Yes. Medical problems stabilized or resolved: Yes. Denies suicidal/homicidal ideation: Yes. Issues/concerns per patient self-inventory: No. Other: none  New problem(s) identified: 12/14/24 no new changes New Short Term/Long Term Goal(s):Update 12/14/2024 no new changes   Patient Goals:    I haven't set any goals for the hospital yet. I don't need psychiatric help 10/23/24 Update: No changes at this time. 10/28/24 Update: No changes at this time. Update 11/03/24: No changes at this time Update 11/09/24: No changes at this time Update 11/14/24: No changes at this time2/21/25 Update: No changes at this time. Update 11/29/2024: No changes at this time.   Update 12/04/2024: No changes at this time. Update 12/09/2024: No changes at this time.   12/14/24 no new changes  Discharge Plan or Barriers: CSW will assist with appropriate discharge plan 10/23/24 Update: No changes at this time. 10/28/24 Update: CSW to continue to meet with DSS to engage in safe discharge planning and connect patient with appropriate resources. Update 11/03/24: No changes at this time Update 11/09/24: No changes at this time Update 11/14/24: APS drafting a petition for guardianship, awaiting updates regarding scheduled court date at this time. APS has received FL2 to begin placement search. 11/24/24 Update: No changes at this time.  Update 11/29/2024: Placement continues to be sought at this time.  Update  12/04/2024: No updates on placement search at this time Update 12/09/2024: No changes at this time.  Update:12/14/24 no new changes    Reason for Continuation of Hospitalization: Delusions  12/14/24 no changes  Estimated Length of Stay:12/14/2024 TBD  Last 3 [place] Suicide Severity Risk Score: Flowsheet Row Admission (Current) from 10/17/2024 in [facility] ED from 10/14/2024 in [facility] ED to Hosp-Admission (Discharged) from 03/13/2024 in [facility]  C-SSRS RISK CATEGORY No Risk No Risk No Risk    Last PHQ 2/9 Scores:     No data to display          Scribe for Treatment Team: Pamila Nine, KEN 12/14/2024 1:27 PM

## 2024-12-14 NOTE — Group Note (Signed)
 Date:  12/14/2024 Time:  10:52 AM  Group Topic/Focus:  Coping With Mental Health Crisis:   The purpose of this group is to help patients identify strategies for coping with mental health crisis.  Group discusses possible causes of crisis and ways to manage them effectively.    Participation Level:  Active  Participation Quality:  Appropriate  Affect:  Appropriate  Cognitive:  Appropriate  Insight: Appropriate  Engagement in Group:  Engaged  Modes of Intervention:  Discussion   [name] 12/14/2024, 10:52 AM

## 2024-12-15 DIAGNOSIS — F200 Paranoid schizophrenia: Secondary | ICD-10-CM | POA: Diagnosis not present

## 2024-12-15 DIAGNOSIS — F0390 Unspecified dementia without behavioral disturbance: Secondary | ICD-10-CM | POA: Diagnosis not present

## 2024-12-15 NOTE — Group Note (Signed)
 [facility] LCSW Group Therapy Note   Group Date: 12/15/2024 Start Time: 1100 End Time: 1130  Type of Therapy and Topic:  Group Therapy:  Feelings around Relapse and Recovery  Participation Level:  Did Not Attend   Mood: Not able to assess.  Description of Group:    Patients in this group will discuss emotions they experience before and after a relapse. They will process how experiencing these feelings, or avoidance of experiencing them, relates to having a relapse. Facilitator will guide patients to explore emotions they have related to recovery. Patients will be encouraged to process which emotions are more powerful. They will be guided to discuss the emotional reaction significant others in their lives may have to patients relapse or recovery. Patients will be assisted in exploring ways to respond to the emotions of others without this contributing to a relapse.  Therapeutic Goals: Patient will identify two or more emotions that lead to relapse for them:  Patient will identify two emotions that result when they relapse:  Patient will identify two emotions related to recovery:  Patient will demonstrate ability to communicate their needs through discussion and/or role plays.   Summary of Patient Progress:  Pt did not participation.   Therapeutic Modalities:   Cognitive Behavioral Therapy Solution-Focused Therapy Assertiveness Training Relapse Prevention Therapy   [name], LCSWA

## 2024-12-15 NOTE — Progress Notes (Signed)
 Patient pleasant and responsive during interaction. Accepted medications without issue. PRN tylenol  for shoulder/neck pain earlier in shift. No additional complaints of pain. Minimal conversation but present for meals and group. Ongoing monitoring continues.   12/15/24 1100  Psych Admission Type (Psych Patients Only)  Admission Status Involuntary  Psychosocial Assessment  Patient Complaints None  Eye Contact Fair  Facial Expression Animated  Affect Appropriate to circumstance  Speech Logical/coherent;Soft  Interaction Minimal  Motor Activity Slow  Appearance/Hygiene Unremarkable  Behavior Characteristics Appropriate to situation;Calm  Mood Pleasant  Thought Process  Coherency WDL  Content WDL  Delusions None reported or observed  Perception WDL  Hallucination None reported or observed  Judgment Impaired  Confusion Mild  Danger to Self  Current suicidal ideation? Denies  Danger to Others  Danger to Others None reported or observed

## 2024-12-15 NOTE — Progress Notes (Signed)
 [facility] MD Progress Note   5:46 PM Shari [name]  MRN:  [id number] Patient is a 69 year old female with a history of Schizoaffective Disorder, Bipolar Type who presents via [employer] under IVC, initiated by CM, Falencio with re-entry program, to [facility] for assessment. Per IVC, [name] has been diagnosed with Schizophrenia and is refusing to take medication. [name] isn't sleeping. Residents report her screaming and yelling all night long.  [name] responding to internal stimuli.  [name] verbally aggressive.  The respondent has locked all of the residents inside of the transitional housing home.  They could not leave the home which prompted the IVC.  Residents did not have access to food, the restroom or anything as a result of being locked in.  [name] is currently under Adult protective services care. Patient is admitted to [facility] unit with Q15 min safety monitoring. Multidisciplinary team approach is offered. Medication management; group/milieu therapy is offered.   Subjective:  Chart reviewed, case discussed in multidisciplinary meeting, patient seen during rounds.   Patient is noted to be resting in bed.  She offers no complaints.  She denies SI/HI/plan.  Patient reports aches of her body.  Patient and provider discussed about her hyperthyroid state and encouraged patient to be hydrated Per nursing patient is taking her medications with no reported side effects   Psychiatric History: see h&P Family History:  Family History  Problem Relation Age of Onset   Diabetes Mother    CAD Mother    Lung cancer Father    Social History:  Social History   Substance and Sexual Activity  Alcohol Use Yes   Comment: beer occasionally     Social History   Substance and Sexual Activity  Drug Use No    Social History   Socioeconomic History   Marital status: Widowed    Spouse name: Not on file   Number of children: Not on file   Years of education: Not on file    Highest education level: Not on file  Occupational History   Not on file  Tobacco Use   Smoking status: Every Day    Current packs/day: 0.50    Average packs/day: 0.5 packs/day for 40.0 years (20.0 ttl pk-yrs)    Types: Cigarettes   Smokeless tobacco: Never  Vaping Use   Vaping status: Never Used  Substance and Sexual Activity   Alcohol use: Yes    Comment: beer occasionally   Drug use: No   Sexual activity: Not Currently    Birth control/protection: Post-menopausal  Other Topics Concern   Not on file  Social History Narrative   Not on file   Social Drivers of Health   Tobacco Use: High Risk (11/02/2024)   Patient History    Smoking Tobacco Use: Every Day    Smokeless Tobacco Use: Never    Passive Exposure: Not on file  Financial Resource Strain: Not on file  Food Insecurity: No Food Insecurity (10/17/2024)   Epic    Worried About Programme Researcher, Broadcasting/film/video in the Last Year: Never true    Ran Out of Food in the Last Year: Never true  Transportation Needs: No Transportation Needs (10/17/2024)   Epic    Lack of Transportation (Medical): No    Lack of Transportation (Non-Medical): No  Recent Concern: Transportation Needs - Unmet Transportation Needs (10/14/2024)   Epic    Lack of Transportation (Medical): Yes    Lack of Transportation (Non-Medical): Yes  Physical Activity:  Not on file  Stress: Not on file  Social Connections: Unknown (10/17/2024)   Social Connection and Isolation Panel    Frequency of Communication with Friends and Family: Patient unable to answer    Frequency of Social Gatherings with Friends and Family: Patient unable to answer    Attends Religious Services: Patient unable to answer    Active Member of Clubs or Organizations: Patient declined    Attends Banker Meetings: Patient unable to answer    Marital Status: Widowed  Depression (PHQ2-9): Not on file  Alcohol Screen: Low Risk (10/17/2024)   Alcohol Screen    Last Alcohol Screening  Score (AUDIT): 0  Housing: Low Risk (10/17/2024)   Epic    Unable to Pay for Housing in the Last Year: No    Number of Times Moved in the Last Year: 0    Homeless in the Last Year: No  Utilities: Not At Risk (10/17/2024)   Epic    Threatened with loss of utilities: No  Health Literacy: Not on file   Past Medical History:  Past Medical History:  Diagnosis Date   Eye globe prosthesis    GERD (gastroesophageal reflux disease)    History of blood transfusion 1973   related to abscess burst in my stomach   Hyperlipemia    Hyperlipidemia    Hypertension    Osteoarthritis of back    Lowerback    SVD (spontaneous vaginal delivery)    x 1, baby died at 2 wks of age   Type II diabetes mellitus (HCC)     Past Surgical History:  Procedure Laterality Date   APPENDECTOMY     COLONOSCOPY     DILATATION & CURETTAGE/HYSTEROSCOPY WITH MYOSURE N/A 02/25/2015   Procedure: DILATATION & CURETTAGE/HYSTEROSCOPY WITH MYOSURE;  Surgeon: Truman Corona, MD;  Location: [facility];  Service: Gynecology;  Laterality: N/A;   DILATION AND CURETTAGE OF UTERUS     ENUCLEATION Right 03/16/2017   ENUCLEATION Right 03/16/2017   Procedure: ENUCLEATION RIGHT EYE;  Surgeon: Loyd Kathryne Palm, MD;  Location: [facility] OR;  Service: Ophthalmology;  Laterality: Right;   EYE SURGERY     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6, 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   EYE SURGERY Right ~ 1974   S/P initial eye injury; scissors stuck in my eye   LAPAROSCOPIC CHOLECYSTECTOMY     SHOULDER ARTHROSCOPY WITH ROTATOR CUFF REPAIR Left    wire stitches per patient   SHOULDER ARTHROSCOPY WITH ROTATOR CUFF REPAIR Right     Current Medications: Current Facility-Administered Medications  Medication Dose Route Frequency Provider Last Rate Last Admin   acetaminophen  (TYLENOL ) tablet 650 mg  650 mg Oral Q6H PRN Tex Drilling, NP   650 mg at 12/07/24 2126   alum & mag hydroxide-simeth (MAALOX/MYLANTA) 200-200-20 MG/5ML suspension 30 mL  30 mL Oral Q4H PRN Tex Drilling,  NP       cloNIDine  (CATAPRES ) tablet 0.1 mg  0.1 mg Oral BID PRN Tex Drilling, NP       divalproex  (DEPAKOTE ) DR tablet 1,500 mg  1,500 mg Oral QHS Asherah Lavoy, MD   1,500 mg at 12/07/24 2126   divalproex  (DEPAKOTE ) DR tablet 500 mg  500 mg Oral q AM Emari Hreha, MD   500 mg at 12/08/24 9192   losartan  (COZAAR ) tablet 25 mg  25 mg Oral Daily Nkwenti, Doris, NP   25 mg at 12/08/24 9071   magnesium  hydroxide (MILK OF MAGNESIA) suspension 30 mL  30 mL Oral  Daily PRN Tex Drilling, NP       menthol  (CEPACOL) lozenge 3 mg  1 lozenge Oral PRN Bobbitt, Shalon E, NP   3 mg at 12/08/24 0934   methimazole  (TAPAZOLE ) tablet 10 mg  10 mg Oral Daily Nkwenti, Doris, NP   10 mg at 12/08/24 9071   OLANZapine  (ZYPREXA ) injection 5 mg  5 mg Intramuscular TID PRN Tex Drilling, NP       OLANZapine  (ZYPREXA ) injection 5 mg  5 mg Intramuscular TID PRN Tex Drilling, NP       OLANZapine  (ZYPREXA ) tablet 20 mg  20 mg Oral QHS Carlyn Mullenbach, MD   20 mg at 12/07/24 2127   OLANZapine  zydis (ZYPREXA ) disintegrating tablet 5 mg  5 mg Oral TID PRN Tex Drilling, NP       OLANZapine  zydis (ZYPREXA ) disintegrating tablet 5 mg  5 mg Oral TID PRN Tex Drilling, NP       paliperidone  (INVEGA ) 24 hr tablet 3 mg  3 mg Oral Daily Shrivastava, Aryendra, MD   3 mg at 12/08/24 9071   pantoprazole  (PROTONIX ) EC tablet 40 mg  40 mg Oral Daily Nkwenti, Doris, NP   40 mg at 12/08/24 9071   traZODone  (DESYREL ) tablet 50 mg  50 mg Oral QHS PRN Tex Drilling, NP   50 mg at 12/06/24 2109    Lab Results:  No results found for this or any previous visit (from the past 48 hours).       Blood Alcohol level:  Lab Results  Component Value Date   [facility] <15 10/14/2024   ETH <10 07/02/2022    Metabolic Disorder Labs: Lab Results  Component Value Date   HGBA1C 5.5 10/14/2024   MPG 111.15 10/14/2024   MPG 137 01/06/2022   Lab Results  Component Value Date   PROLACTIN 5.8 10/14/2024   Lab Results  Component Value  Date   CHOL 167 10/14/2024   TRIG 68 10/14/2024   HDL 60 10/14/2024   CHOLHDL 2.8 10/14/2024   VLDL 14 10/14/2024   LDLCALC 93 10/14/2024   LDLCALC 73 03/13/2024    Physical Findings: AIMS:  , ,  ,  ,    CIWA:    COWS:      Psychiatric Specialty Exam:  Presentation  General Appearance:  Appropriate for Environment  Eye Contact: Fleeting  Speech: Normal Rate  Speech Volume: Decreased    Mood and Affect  Mood:fine  Affect: Flat   Thought Process  Thought Processes: impoverished  Orientation:Partial  Thought Content: Chronic delusions Hallucinations: Denies  Ideas of Reference:Delusions; Paranoia  Suicidal Thoughts: Denies  Homicidal Thoughts: Denies   Sensorium  Memory: Immediate Fair; Recent Fair  Judgment: Impaired  Insight: Shallow   Executive Functions  Concentration: Fair  Attention Span: Fair  Recall: Poor  Fund of Knowledge: Fair  Language: Fair   Psychomotor Activity  Psychomotor Activity: No data recorded  Musculoskeletal: Strength & Muscle Tone: within normal limits Gait & Station: normal Assets  Assets: Manufacturing Systems Engineer; Desire for Improvement; Social Support    Physical Exam: Physical Exam Vitals and nursing note reviewed.    ROS Blood pressure (!) 152/69, pulse 98, temperature 98.3 F (36.8 C), resp. rate 18, height 5' 8 (1.727 m), weight 48.3 kg, SpO2 100%. Body mass index is 16.19 kg/m.  Diagnosis: Principal Problem:   Schizophrenia, paranoid (HCC) Major neurocognitive disorder-slums total score of 13/30   Treatment Plan Summary: APS referral has been made as patient lacks capacity to make medical  decisions.  Recommending legal guardianship and further placement at appropriate level of care  Safety and Monitoring:             -- Involuntary admission to inpatient psychiatric unit for safety, stabilization and treatment             -- Daily contact with patient to assess and evaluate  symptoms and progress in treatment             -- Patient's case to be discussed in multi-disciplinary team meeting             -- Observation Level: q15 minute checks             -- Vital signs:  q12 hours             -- Precautions: suicide, elopement, and assault   2. Psychiatric Diagnoses and Treatment:  Check Depakote  levels 11/20/2024-60 Invega  at 3 mg-plan to titrate it up after medical clearence           Depakote  changed to 500 mg every morning and 1500 mg nightly-in next 5 days 11/13/2024 Depakote  level-44 Zyprexa  20 mg nightly BMP-within normal limits Occupational Therapy evaluation for assessing appropriate level of care -- The risks/benefits/side-effects/alternatives to this medication were discussed in detail with the patient and time was given for questions. The patient consents to medication trial.                -- Metabolic profile and EKG monitoring obtained while on an atypical antipsychotic (BMI: Lipid Panel: HbgA1c: QTc:)              -- Encouraged patient to participate in unit milieu and in scheduled group therapies   Occupational Therapy recommendations If plan is discharge home, recommend the following:   Direct supervision/assist for medications management;Direct supervision/assist for financial management;Assist for transportation;Assistance with cooking/housework    Ms Hauth was seen for OT treatment on this date. Upon arrival to room pt in dayroom, agreeable to tx. The SLUMS is a 30-point screening questionnaire that tests orientation, memory, attention, problem solving, and executive function. Pt scored a 13/30 indicating Dementia. Of note, it is not within occupational therapy scope of practice to diagnose cognitive impairments, this screen indicates need for further testing. Pt with noted impairments in memory and problem solving limiting ability to participate functionally in medication management, bill management, and safety cooking. Will continue to follow  POC. Continue to recommend SUPERVISION for safety with iADLs.    [facility] Mental Status Examination Orientation: 2/3  Calculations: 0/3 Naming animals: 2/3 Patient named 14 animals (0 points is 0-4 animals; 1 is 5-9 animals; 2 is 10-14 animals; 3 is 15+ animals) Recall: 3/5  Attention: 1/2 Clock drawing: 1/4 Visual Processing: 2/2 Paragraph Memory: 2/8  Total: 13/30;  Given that patient has completed high school, this score falls in the dementia range.   4. Discharge Planning:   -- Social work and case management to assist with discharge planning and identification of hospital follow-up needs prior to discharge  -- Estimated LOS: 3-4 days  Millie JONELLE Manners, MD 12/08/2024, 5:46 PM

## 2024-12-15 NOTE — Group Note (Signed)
 Date:  12/15/2024 Time:  3:17 PM  Group Topic/Focus:  House rules and unit expectations    Participation Level:  Active  Participation Quality:  Appropriate  Affect:  Appropriate  Cognitive:  Appropriate  Insight: Appropriate  Engagement in Group:  Engaged  Modes of Intervention:  Discussion and Education  Additional Comments:  none  [name] 12/15/2024, 3:17 PM

## 2024-12-15 NOTE — Plan of Care (Signed)
  Problem: Education: Goal: Emotional status will improve Outcome: Progressing   Problem: Activity: Goal: Interest or engagement in activities will improve Outcome: Progressing   Problem: Coping: Goal: Ability to verbalize frustrations and anger appropriately will improve Outcome: Progressing

## 2024-12-15 NOTE — Group Note (Signed)
 Date:  12/15/2024 Time:  10:56 AM  Group Topic/Focus:  Self Care:   The focus of this group is to help patients understand the importance of self-care in order to improve or restore emotional, physical, spiritual, interpersonal, and financial health.    Participation Level:  None  Participation Quality:     Affect:     Cognitive:     Insight: None  Engagement in Group:  None  Modes of Intervention:     Additional Comments:    Shari [name] C Minas Bonser 12/15/2024, 10:56 AM

## 2024-12-15 NOTE — Plan of Care (Signed)
?  Problem: Education: Goal: Knowledge of [facility] Education information/materials will improve Outcome: Progressing Goal: Emotional status will improve Outcome: Progressing Goal: Mental status will improve Outcome: Progressing Goal: Verbalization of understanding the information provided will improve Outcome: Progressing   Problem: Activity: Goal: Interest or engagement in activities will improve Outcome: Progressing Goal: Sleeping patterns will improve Outcome: Progressing   Problem: Coping: Goal: Ability to verbalize frustrations and anger appropriately will improve Outcome: Progressing Goal: Ability to demonstrate self-control will improve Outcome: Progressing   Problem: Health Behavior/Discharge Planning: Goal: Identification of resources available to assist in meeting health care needs will improve Outcome: Progressing Goal: Compliance with treatment plan for underlying cause of condition will improve Outcome: Progressing   Problem: Physical Regulation: Goal: Ability to maintain clinical measurements within normal limits will improve Outcome: Progressing   

## 2024-12-15 NOTE — Group Note (Signed)
 Date:  12/15/2024 Time:  9:24 PM  Group Topic/Focus:  Wrap-Up Group:   The focus of this group is to help patients review their daily goal of treatment and discuss progress on daily workbooks.    Participation Level:  Active  Participation Quality:  Appropriate  Affect:  Appropriate  Cognitive:  Alert  Insight: Appropriate  Engagement in Group:  Engaged  Modes of Intervention:  Discussion  Additional Comments:    Shari [name] [name] 12/15/2024, 9:24 PM

## 2024-12-16 DIAGNOSIS — F200 Paranoid schizophrenia: Secondary | ICD-10-CM | POA: Diagnosis not present

## 2024-12-16 DIAGNOSIS — F0390 Unspecified dementia without behavioral disturbance: Secondary | ICD-10-CM | POA: Diagnosis not present

## 2024-12-16 NOTE — Plan of Care (Signed)
   Problem: Coping: Goal: Ability to verbalize frustrations and anger appropriately will improve Outcome: Progressing

## 2024-12-16 NOTE — Progress Notes (Signed)
 [facility] MD Progress Note   5:46 PM Shari [name]  MRN:  [id number] Patient is a 69 year old female with a history of Schizoaffective Disorder, Bipolar Type who presents via [employer] under IVC, initiated by CM, Falencio with re-entry program, to [facility] for assessment. Per IVC, [name] has been diagnosed with Schizophrenia and is refusing to take medication. [name] isn't sleeping. Residents report her screaming and yelling all night long.  [name] responding to internal stimuli.  [name] verbally aggressive.  The respondent has locked all of the residents inside of the transitional housing home.  They could not leave the home which prompted the IVC.  Residents did not have access to food, the restroom or anything as a result of being locked in.  [name] is currently under Adult protective services care. Patient is admitted to [facility] unit with Q15 min safety monitoring. Multidisciplinary team approach is offered. Medication management; group/milieu therapy is offered.   Subjective:  Chart reviewed, case discussed in multidisciplinary meeting, patient seen during rounds.   Patient is noted to be walking in the hallway.  She reports of muscle aches.  Will check her TSH given her history of hyperthyroidism.  Patient denies SI/HI/plan.  Patient denies auditory/visual hallucinations.  She reports fair appetite and sleep.  Patient is participating in the groups with no behavioral problems.   Psychiatric History: see h&P Family History:  Family History  Problem Relation Age of Onset   Diabetes Mother    CAD Mother    Lung cancer Father    Social History:  Social History   Substance and Sexual Activity  Alcohol Use Yes   Comment: beer occasionally     Social History   Substance and Sexual Activity  Drug Use No    Social History   Socioeconomic History   Marital status: Widowed    Spouse name: Not on file   Number of children: Not on file   Years of education: Not  on file   Highest education level: Not on file  Occupational History   Not on file  Tobacco Use   Smoking status: Every Day    Current packs/day: 0.50    Average packs/day: 0.5 packs/day for 40.0 years (20.0 ttl pk-yrs)    Types: Cigarettes   Smokeless tobacco: Never  Vaping Use   Vaping status: Never Used  Substance and Sexual Activity   Alcohol use: Yes    Comment: beer occasionally   Drug use: No   Sexual activity: Not Currently    Birth control/protection: Post-menopausal  Other Topics Concern   Not on file  Social History Narrative   Not on file   Social Drivers of Health   Tobacco Use: High Risk (11/02/2024)   Patient History    Smoking Tobacco Use: Every Day    Smokeless Tobacco Use: Never    Passive Exposure: Not on file  Financial Resource Strain: Not on file  Food Insecurity: No Food Insecurity (10/17/2024)   Epic    Worried About Programme Researcher, Broadcasting/film/video in the Last Year: Never true    Ran Out of Food in the Last Year: Never true  Transportation Needs: No Transportation Needs (10/17/2024)   Epic    Lack of Transportation (Medical): No    Lack of Transportation (Non-Medical): No  Recent Concern: Transportation Needs - Unmet Transportation Needs (10/14/2024)   Epic    Lack of Transportation (Medical): Yes    Lack of Transportation (Non-Medical): Yes  Physical  Activity: Not on file  Stress: Not on file  Social Connections: Unknown (10/17/2024)   Social Connection and Isolation Panel    Frequency of Communication with Friends and Family: Patient unable to answer    Frequency of Social Gatherings with Friends and Family: Patient unable to answer    Attends Religious Services: Patient unable to answer    Active Member of Clubs or Organizations: Patient declined    Attends Banker Meetings: Patient unable to answer    Marital Status: Widowed  Depression (PHQ2-9): Not on file  Alcohol Screen: Low Risk (10/17/2024)   Alcohol Screen    Last Alcohol  Screening Score (AUDIT): 0  Housing: Low Risk (10/17/2024)   Epic    Unable to Pay for Housing in the Last Year: No    Number of Times Moved in the Last Year: 0    Homeless in the Last Year: No  Utilities: Not At Risk (10/17/2024)   Epic    Threatened with loss of utilities: No  Health Literacy: Not on file   Past Medical History:  Past Medical History:  Diagnosis Date   Eye globe prosthesis    GERD (gastroesophageal reflux disease)    History of blood transfusion 1973   related to abscess burst in my stomach   Hyperlipemia    Hyperlipidemia    Hypertension    Osteoarthritis of back    Lowerback    SVD (spontaneous vaginal delivery)    x 1, baby died at 2 wks of age   Type II diabetes mellitus (HCC)     Past Surgical History:  Procedure Laterality Date   APPENDECTOMY     COLONOSCOPY     DILATATION & CURETTAGE/HYSTEROSCOPY WITH MYOSURE N/A 02/25/2015   Procedure: DILATATION & CURETTAGE/HYSTEROSCOPY WITH MYOSURE;  Surgeon: Truman Corona, MD;  Location: [facility];  Service: Gynecology;  Laterality: N/A;   DILATION AND CURETTAGE OF UTERUS     ENUCLEATION Right 03/16/2017   ENUCLEATION Right 03/16/2017   Procedure: ENUCLEATION RIGHT EYE;  Surgeon: Loyd Kathryne Palm, MD;  Location: [facility] OR;  Service: Ophthalmology;  Laterality: Right;   EYE SURGERY     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6, 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   EYE SURGERY Right ~ 1974   S/P initial eye injury; scissors stuck in my eye   LAPAROSCOPIC CHOLECYSTECTOMY     SHOULDER ARTHROSCOPY WITH ROTATOR CUFF REPAIR Left    wire stitches per patient   SHOULDER ARTHROSCOPY WITH ROTATOR CUFF REPAIR Right     Current Medications: Current Facility-Administered Medications  Medication Dose Route Frequency Provider Last Rate Last Admin   acetaminophen  (TYLENOL ) tablet 650 mg  650 mg Oral Q6H PRN Tex Drilling, NP   650 mg at 12/07/24 2126   alum & mag hydroxide-simeth (MAALOX/MYLANTA) 200-200-20 MG/5ML suspension 30 mL  30 mL Oral Q4H PRN  Tex Drilling, NP       cloNIDine  (CATAPRES ) tablet 0.1 mg  0.1 mg Oral BID PRN Tex Drilling, NP       divalproex  (DEPAKOTE ) DR tablet 1,500 mg  1,500 mg Oral QHS Makynleigh Breslin, MD   1,500 mg at 12/07/24 2126   divalproex  (DEPAKOTE ) DR tablet 500 mg  500 mg Oral q AM Rameen Gohlke, MD   500 mg at 12/08/24 9192   losartan  (COZAAR ) tablet 25 mg  25 mg Oral Daily Nkwenti, Doris, NP   25 mg at 12/08/24 9071   magnesium  hydroxide (MILK OF MAGNESIA) suspension 30 mL  30 mL  Oral Daily PRN Tex Drilling, NP       menthol  (CEPACOL) lozenge 3 mg  1 lozenge Oral PRN Bobbitt, Shalon E, NP   3 mg at 12/08/24 0934   methimazole  (TAPAZOLE ) tablet 10 mg  10 mg Oral Daily Nkwenti, Doris, NP   10 mg at 12/08/24 9071   OLANZapine  (ZYPREXA ) injection 5 mg  5 mg Intramuscular TID PRN Tex Drilling, NP       OLANZapine  (ZYPREXA ) injection 5 mg  5 mg Intramuscular TID PRN Tex Drilling, NP       OLANZapine  (ZYPREXA ) tablet 20 mg  20 mg Oral QHS Marvens Hollars, MD   20 mg at 12/07/24 2127   OLANZapine  zydis (ZYPREXA ) disintegrating tablet 5 mg  5 mg Oral TID PRN Tex Drilling, NP       OLANZapine  zydis (ZYPREXA ) disintegrating tablet 5 mg  5 mg Oral TID PRN Tex Drilling, NP       paliperidone  (INVEGA ) 24 hr tablet 3 mg  3 mg Oral Daily Shrivastava, Aryendra, MD   3 mg at 12/08/24 9071   pantoprazole  (PROTONIX ) EC tablet 40 mg  40 mg Oral Daily Nkwenti, Doris, NP   40 mg at 12/08/24 9071   traZODone  (DESYREL ) tablet 50 mg  50 mg Oral QHS PRN Tex Drilling, NP   50 mg at 12/06/24 2109    Lab Results:  No results found for this or any previous visit (from the past 48 hours).       Blood Alcohol level:  Lab Results  Component Value Date   [facility] <15 10/14/2024   ETH <10 07/02/2022    Metabolic Disorder Labs: Lab Results  Component Value Date   HGBA1C 5.5 10/14/2024   MPG 111.15 10/14/2024   MPG 137 01/06/2022   Lab Results  Component Value Date   PROLACTIN 5.8 10/14/2024   Lab Results   Component Value Date   CHOL 167 10/14/2024   TRIG 68 10/14/2024   HDL 60 10/14/2024   CHOLHDL 2.8 10/14/2024   VLDL 14 10/14/2024   LDLCALC 93 10/14/2024   LDLCALC 73 03/13/2024    Physical Findings: AIMS:  , ,  ,  ,    CIWA:    COWS:      Psychiatric Specialty Exam:  Presentation  General Appearance:  Appropriate for Environment  Eye Contact: Fleeting  Speech: Normal Rate  Speech Volume: Decreased    Mood and Affect  Mood:fine  Affect: Flat   Thought Process  Thought Processes: impoverished  Orientation:Partial  Thought Content: Chronic delusions Hallucinations: Denies  Ideas of Reference:Delusions; Paranoia  Suicidal Thoughts: Denies  Homicidal Thoughts: Denies   Sensorium  Memory: Immediate Fair; Recent Fair  Judgment: Impaired  Insight: Shallow   Executive Functions  Concentration: Fair  Attention Span: Fair  Recall: Poor  Fund of Knowledge: Fair  Language: Fair   Psychomotor Activity  Psychomotor Activity: No data recorded  Musculoskeletal: Strength & Muscle Tone: within normal limits Gait & Station: normal Assets  Assets: Manufacturing Systems Engineer; Desire for Improvement; Social Support    Physical Exam: Physical Exam Vitals and nursing note reviewed.    ROS Blood pressure (!) 152/69, pulse 98, temperature 98.3 F (36.8 C), resp. rate 18, height 5' 8 (1.727 m), weight 48.3 kg, SpO2 100%. Body mass index is 16.19 kg/m.  Diagnosis: Principal Problem:   Schizophrenia, paranoid (HCC) Major neurocognitive disorder-slums total score of 13/30   Treatment Plan Summary: APS referral has been made as patient lacks capacity to make  medical decisions.  Recommending legal guardianship and further placement at appropriate level of care  Safety and Monitoring:             -- Involuntary admission to inpatient psychiatric unit for safety, stabilization and treatment             -- Daily contact with patient to  assess and evaluate symptoms and progress in treatment             -- Patient's case to be discussed in multi-disciplinary team meeting             -- Observation Level: q15 minute checks             -- Vital signs:  q12 hours             -- Precautions: suicide, elopement, and assault   2. Psychiatric Diagnoses and Treatment:  Check Depakote  levels 11/20/2024-60 Invega  at 3 mg-plan to titrate it up after medical clearence           Depakote  changed to 500 mg every morning and 1500 mg nightly-in next 5 days 11/13/2024 Depakote  level-44 Zyprexa  20 mg nightly BMP-within normal limits Occupational Therapy evaluation for assessing appropriate level of care -- The risks/benefits/side-effects/alternatives to this medication were discussed in detail with the patient and time was given for questions. The patient consents to medication trial.                -- Metabolic profile and EKG monitoring obtained while on an atypical antipsychotic (BMI: Lipid Panel: HbgA1c: QTc:)              -- Encouraged patient to participate in unit milieu and in scheduled group therapies   Occupational Therapy recommendations If plan is discharge home, recommend the following:   Direct supervision/assist for medications management;Direct supervision/assist for financial management;Assist for transportation;Assistance with cooking/housework    Ms Hebel was seen for OT treatment on this date. Upon arrival to room pt in dayroom, agreeable to tx. The SLUMS is a 30-point screening questionnaire that tests orientation, memory, attention, problem solving, and executive function. Pt scored a 13/30 indicating Dementia. Of note, it is not within occupational therapy scope of practice to diagnose cognitive impairments, this screen indicates need for further testing. Pt with noted impairments in memory and problem solving limiting ability to participate functionally in medication management, bill management, and safety cooking. Will  continue to follow POC. Continue to recommend SUPERVISION for safety with iADLs.    [facility] Mental Status Examination Orientation: 2/3  Calculations: 0/3 Naming animals: 2/3 Patient named 14 animals (0 points is 0-4 animals; 1 is 5-9 animals; 2 is 10-14 animals; 3 is 15+ animals) Recall: 3/5  Attention: 1/2 Clock drawing: 1/4 Visual Processing: 2/2 Paragraph Memory: 2/8  Total: 13/30;  Given that patient has completed high school, this score falls in the dementia range.   4. Discharge Planning:   -- Social work and case management to assist with discharge planning and identification of hospital follow-up needs prior to discharge  -- Estimated LOS: 3-4 days  Millie JONELLE Manners, MD 12/08/2024, 5:46 PM

## 2024-12-16 NOTE — Plan of Care (Signed)
   Problem: Activity: Goal: Interest or engagement in activities will improve Outcome: Progressing   Problem: Coping: Goal: Ability to verbalize frustrations and anger appropriately will improve Outcome: Progressing Goal: Ability to demonstrate self-control will improve Outcome: Progressing

## 2024-12-16 NOTE — Group Note (Signed)
 Recreation Therapy Group Note   Group Topic:Stress Management  Group Date: 12/16/2024 Start Time: 1415 End Time: 1440 Facilitators: Celestia Jeoffrey BRAVO, LRT, CTRS Location: Dayroom  Group Description: PMR (Progressive Muscle Relaxation). LRT educates patients on what PMR is and the benefits that come from it. Patients are asked to sit with their feet flat on the floor while sitting up and all the way back in their chair, if possible. LRT and pts follow a prompt through a speaker that requires you to tense and release different muscles in their body and focus on their breathing. During session, lights are off and soft music is being played. Pts are given a stress ball to use if needed.   Goal Area(s) Addressed:  Patients will be able to describe progressive muscle relaxation.  Patient will practice using relaxation technique. Patient will identify a new coping skill.  Patient will follow multistep directions to reduce anxiety and stress.   Affect/Mood: Appropriate and Flat   Participation Level: Active and Engaged   Participation Quality: Independent   Behavior: Calm and Cooperative   Speech/Thought Process: Coherent   Insight: Fair   Judgement: Fair    Modes of Intervention: Clarification, Education, Exploration, and Guided Discussion   Patient Response to Interventions:  Attentive and Receptive   Education Outcome:  Acknowledges education   Clinical Observations/Individualized Feedback: Latonja was active in their participation of session activities and group discussion. Pt completed all of the exercises as prompted.    Plan: Continue to engage patient in RT group sessions 2-3x/week.   Jeoffrey BRAVO Celestia, LRT, CTRS 12/16/2024 4:40 PM

## 2024-12-16 NOTE — Group Note (Signed)
 Date:  12/16/2024 Time:  10:57 PM  Group Topic/Focus:  Wrap-Up Group:   The focus of this group is to help patients review their daily goal of treatment and discuss progress on daily workbooks.  Boundaries Group  Participation Level:  Active  Participation Quality:  Appropriate  Affect:  Appropriate  Cognitive:  Appropriate  Insight: Appropriate  Engagement in Group:  Engaged  Modes of Intervention:  support  Additional Comments:    Gwendlyn LITTIE Sharps 12/16/2024, 10:57 PM

## 2024-12-16 NOTE — Group Note (Signed)
 Date:  12/16/2024 Time:  10:53 AM  Group Topic/Focus:  Self Care:   The focus of this group is to help patients understand the importance of self-care in order to improve or restore emotional, physical, spiritual, interpersonal, and financial health.    Participation Level:  Active  Participation Quality:  Appropriate  Affect:  Appropriate  Cognitive:  Appropriate  Insight: Appropriate  Engagement in Group:  Engaged  Modes of Intervention:  Activity  Additional Comments:    Brigida Scotti 12/16/2024, 10:53 AM

## 2024-12-16 NOTE — Progress Notes (Signed)
 Behavior: Pleasant and cooperative.    Psych assessment:  Denies SI/HI and AVH.    Group attendance:  2/2  Medication/ PRNs: Compliant.  PRN medication given as ordered for pain.   Pain:  4/10 in shoulder.  15 min checks in place for safety.

## 2024-12-16 NOTE — Plan of Care (Signed)
  Problem: Health Behavior/Discharge Planning: Goal: Identification of resources available to assist in meeting health care needs will improve Outcome: Progressing Goal: Compliance with treatment plan for underlying cause of condition will improve Outcome: Progressing   Problem: Safety: Goal: Periods of time without injury will increase Outcome: Progressing

## 2024-12-17 DIAGNOSIS — F0390 Unspecified dementia without behavioral disturbance: Secondary | ICD-10-CM | POA: Diagnosis not present

## 2024-12-17 DIAGNOSIS — F200 Paranoid schizophrenia: Secondary | ICD-10-CM | POA: Diagnosis not present

## 2024-12-17 NOTE — Progress Notes (Signed)
(  Sleep Hours) - 7.50 (Any PRNs that were needed, meds refused, or side effects to meds)- Trazodone , Tylenol -effective (Any disturbances and when (visitation, over night)-None reported or observed (Concerns raised by the patient)- None reported or observed (SI/HI/AVH)- Denies

## 2024-12-17 NOTE — Group Note (Signed)
 Date:  12/17/2024 Time:  9:18 PM  Group Topic/Focus:  Wrap-Up Group:   The focus of this group is to help patients review their daily goal of treatment and discuss progress on daily workbooks.    Participation Level:  Active  Participation Quality:  Appropriate  Affect:  Appropriate  Cognitive:  Alert  Insight: Appropriate  Engagement in Group:  Engaged  Modes of Intervention:  Discussion  Additional Comments:    Shari [name] 12/17/2024, 9:18 PM

## 2024-12-17 NOTE — Group Note (Signed)
 Date:  12/17/2024 Time:  11:03 AM  Group Topic/Focus:  Orientation:   The focus of this group is to educate the patient on the purpose and policies of crisis stabilization and provide a format to answer questions about their admission.  The group details unit policies and expectations of patients while admitted. Wellness Toolbox:   The focus of this group is to discuss various aspects of wellness, balancing those aspects and exploring ways to increase the ability to experience wellness.  Patients will create a wellness toolbox for use upon discharge.    Participation Level:  Did Not Attend   Maglione,Tyson Parkison E 12/17/2024, 11:03 AM

## 2024-12-17 NOTE — Plan of Care (Signed)

## 2024-12-17 NOTE — Group Note (Signed)
 Recreation Therapy Group Note   Group Topic:Leisure Education  Group Date: 12/17/2024 Start Time: 1415 End Time: 1500 Facilitators: Celestia Jeoffrey BRAVO, LRT, CTRS Location: Dayroom  Group Description: General Recreation. Patients were given the opportunity to play cards, journal, or color mandalas during group. Pt identified and conversated about things they enjoy doing in their free time and how they can continue to do that outside of the hospital.  Goal Area(s) Addressed: Patient will practice making a positive decision. Patient will have the opportunity to try a new leisure activity. Patient will communicate with peers and LRT.   Affect/Mood: Appropriate   Participation Level: Engaged   Participation Quality: Independent   Behavior: Calm and Cooperative   Speech/Thought Process: Coherent   Insight: Fair   Judgement: Fair    Modes of Intervention: Education, Exploration, Guided Discussion, and Music   Patient Response to Interventions:  Attentive, Engaged, and Receptive   Education Outcome:  Acknowledges education   Clinical Observations/Individualized Feedback: Keleigh was active in their participation of session activities and group discussion. Pt interacted well with LRT and peers duration of session.    Plan: Continue to engage patient in RT group sessions 2-3x/week.   Jeoffrey BRAVO Celestia, LRT, CTRS 12/17/2024 4:58 PM

## 2024-12-17 NOTE — Progress Notes (Signed)
 [facility] MD Progress Note   5:46 PM Shari [name]  MRN:  [id number] Patient is a 69 year old female with a history of Schizoaffective Disorder, Bipolar Type who presents via [employer] under IVC, initiated by CM, Falencio with re-entry program, to [facility] for assessment. Per IVC, [name] has been diagnosed with Schizophrenia and is refusing to take medication. [name] isn't sleeping. Residents report her screaming and yelling all night long.  [name] responding to internal stimuli.  [name] verbally aggressive.  The respondent has locked all of the residents inside of the transitional housing home.  They could not leave the home which prompted the IVC.  Residents did not have access to food, the restroom or anything as a result of being locked in.  [name] is currently under Adult protective services care. Patient is admitted to [facility] unit with Q15 min safety monitoring. Multidisciplinary team approach is offered. Medication management; group/milieu therapy is offered.   Subjective:  Chart reviewed, case discussed in multidisciplinary meeting, patient seen during rounds.   Patient is noted to be resting in bed.  He offers no complaints.  She reports that her muscle aches or much improved today.  She reports fair appetite and sleep.  She does complain about the other female patient on the unit creating disruption on the unit.  Per nursing patient is taking her medications with no reported side effects.  Psychiatric History: see h&P Family History:  Family History  Problem Relation Age of Onset   Diabetes Mother    CAD Mother    Lung cancer Father    Social History:  Social History   Substance and Sexual Activity  Alcohol Use Yes   Comment: beer occasionally     Social History   Substance and Sexual Activity  Drug Use No    Social History   Socioeconomic History   Marital status: Widowed    Spouse name: Not on file   Number of children: Not on file   Years of  education: Not on file   Highest education level: Not on file  Occupational History   Not on file  Tobacco Use   Smoking status: Every Day    Current packs/day: 0.50    Average packs/day: 0.5 packs/day for 40.0 years (20.0 ttl pk-yrs)    Types: Cigarettes   Smokeless tobacco: Never  Vaping Use   Vaping status: Never Used  Substance and Sexual Activity   Alcohol use: Yes    Comment: beer occasionally   Drug use: No   Sexual activity: Not Currently    Birth control/protection: Post-menopausal  Other Topics Concern   Not on file  Social History Narrative   Not on file   Social Drivers of Health   Tobacco Use: High Risk (11/02/2024)   Patient History    Smoking Tobacco Use: Every Day    Smokeless Tobacco Use: Never    Passive Exposure: Not on file  Financial Resource Strain: Not on file  Food Insecurity: No Food Insecurity (10/17/2024)   Epic    Worried About Programme Researcher, Broadcasting/film/video in the Last Year: Never true    Ran Out of Food in the Last Year: Never true  Transportation Needs: No Transportation Needs (10/17/2024)   Epic    Lack of Transportation (Medical): No    Lack of Transportation (Non-Medical): No  Recent Concern: Transportation Needs - Unmet Transportation Needs (10/14/2024)   Epic    Lack of Transportation (Medical): Yes  Lack of Transportation (Non-Medical): Yes  Physical Activity: Not on file  Stress: Not on file  Social Connections: Unknown (10/17/2024)   Social Connection and Isolation Panel    Frequency of Communication with Friends and Family: Patient unable to answer    Frequency of Social Gatherings with Friends and Family: Patient unable to answer    Attends Religious Services: Patient unable to answer    Active Member of Clubs or Organizations: Patient declined    Attends Banker Meetings: Patient unable to answer    Marital Status: Widowed  Depression (PHQ2-9): Not on file  Alcohol Screen: Low Risk (10/17/2024)   Alcohol Screen     Last Alcohol Screening Score (AUDIT): 0  Housing: Low Risk (10/17/2024)   Epic    Unable to Pay for Housing in the Last Year: No    Number of Times Moved in the Last Year: 0    Homeless in the Last Year: No  Utilities: Not At Risk (10/17/2024)   Epic    Threatened with loss of utilities: No  Health Literacy: Not on file   Past Medical History:  Past Medical History:  Diagnosis Date   Eye globe prosthesis    GERD (gastroesophageal reflux disease)    History of blood transfusion 1973   related to abscess burst in my stomach   Hyperlipemia    Hyperlipidemia    Hypertension    Osteoarthritis of back    Lowerback    SVD (spontaneous vaginal delivery)    x 1, baby died at 2 wks of age   Type II diabetes mellitus (HCC)     Past Surgical History:  Procedure Laterality Date   APPENDECTOMY     COLONOSCOPY     DILATATION & CURETTAGE/HYSTEROSCOPY WITH MYOSURE N/A 02/25/2015   Procedure: DILATATION & CURETTAGE/HYSTEROSCOPY WITH MYOSURE;  Surgeon: Truman Corona, MD;  Location: [facility];  Service: Gynecology;  Laterality: N/A;   DILATION AND CURETTAGE OF UTERUS     ENUCLEATION Right 03/16/2017   ENUCLEATION Right 03/16/2017   Procedure: ENUCLEATION RIGHT EYE;  Surgeon: Loyd Kathryne Palm, MD;  Location: [facility] OR;  Service: Ophthalmology;  Laterality: Right;   EYE SURGERY     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6, 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   EYE SURGERY Right ~ 1974   S/P initial eye injury; scissors stuck in my eye   LAPAROSCOPIC CHOLECYSTECTOMY     SHOULDER ARTHROSCOPY WITH ROTATOR CUFF REPAIR Left    wire stitches per patient   SHOULDER ARTHROSCOPY WITH ROTATOR CUFF REPAIR Right     Current Medications: Current Facility-Administered Medications  Medication Dose Route Frequency Provider Last Rate Last Admin   acetaminophen  (TYLENOL ) tablet 650 mg  650 mg Oral Q6H PRN [place] Drilling, NP   650 mg at 12/07/24 2126   alum & mag hydroxide-simeth (MAALOX/MYLANTA) 200-200-20 MG/5ML suspension 30 mL  30 mL Oral  Q4H PRN Nkwenti, Drilling, NP       cloNIDine  (CATAPRES ) tablet 0.1 mg  0.1 mg Oral BID PRN [place] Drilling, NP       divalproex  (DEPAKOTE ) DR tablet 1,500 mg  1,500 mg Oral QHS Khamora Karan, MD   1,500 mg at 12/07/24 2126   divalproex  (DEPAKOTE ) DR tablet 500 mg  500 mg Oral q AM Nara Paternoster, MD   500 mg at 12/08/24 9192   losartan  (COZAAR ) tablet 25 mg  25 mg Oral Daily Nkwenti, Doris, NP   25 mg at 12/08/24 9071   magnesium  hydroxide (MILK OF  MAGNESIA) suspension 30 mL  30 mL Oral Daily PRN [place] Drilling, NP       menthol  (CEPACOL) lozenge 3 mg  1 lozenge Oral PRN Bobbitt, Shalon E, NP   3 mg at 12/08/24 0934   methimazole  (TAPAZOLE ) tablet 10 mg  10 mg Oral Daily Nkwenti, Doris, NP   10 mg at 12/08/24 9071   OLANZapine  (ZYPREXA ) injection 5 mg  5 mg Intramuscular TID PRN [place] Drilling, NP       OLANZapine  (ZYPREXA ) injection 5 mg  5 mg Intramuscular TID PRN Tex Drilling, NP       OLANZapine  (ZYPREXA ) tablet 20 mg  20 mg Oral QHS Everett Ricciardelli, MD   20 mg at 12/07/24 2127   OLANZapine  zydis (ZYPREXA ) disintegrating tablet 5 mg  5 mg Oral TID PRN [place] Drilling, NP       OLANZapine  zydis (ZYPREXA ) disintegrating tablet 5 mg  5 mg Oral TID PRN [place] Drilling, NP       paliperidone  (INVEGA ) 24 hr tablet 3 mg  3 mg Oral Daily Shrivastava, Aryendra, MD   3 mg at 12/08/24 9071   pantoprazole  (PROTONIX ) EC tablet 40 mg  40 mg Oral Daily Nkwenti, Doris, NP   40 mg at 12/08/24 9071   traZODone  (DESYREL ) tablet 50 mg  50 mg Oral QHS PRN [place] Drilling, NP   50 mg at 12/06/24 2109    Lab Results:  No results found for this or any previous visit (from the past 48 hours).       Blood Alcohol level:  Lab Results  Component Value Date   [facility] <15 10/14/2024   ETH <10 07/02/2022    Metabolic Disorder Labs: Lab Results  Component Value Date   HGBA1C 5.5 10/14/2024   MPG 111.15 10/14/2024   MPG 137 01/06/2022   Lab Results  Component Value Date   PROLACTIN 5.8 10/14/2024   Lab  Results  Component Value Date   CHOL 167 10/14/2024   TRIG 68 10/14/2024   HDL 60 10/14/2024   CHOLHDL 2.8 10/14/2024   VLDL 14 10/14/2024   LDLCALC 93 10/14/2024   LDLCALC 73 03/13/2024    Physical Findings: AIMS:  , ,  ,  ,    CIWA:    COWS:      Psychiatric Specialty Exam:  Presentation  General Appearance:  Appropriate for Environment  Eye Contact: Fleeting  Speech: Normal Rate  Speech Volume: Decreased    Mood and Affect  Mood:fine  Affect: Flat   Thought Process  Thought Processes: impoverished  Orientation:Partial  Thought Content: Chronic delusions Hallucinations: Denies  Ideas of Reference:Delusions; Paranoia  Suicidal Thoughts: Denies  Homicidal Thoughts: Denies   Sensorium  Memory: Immediate Fair; Recent Fair  Judgment: Impaired  Insight: Shallow   Executive Functions  Concentration: Fair  Attention Span: Fair  Recall: Poor  Fund of Knowledge: Fair  Language: Fair   Psychomotor Activity  Psychomotor Activity: No data recorded  Musculoskeletal: Strength & Muscle Tone: within normal limits Gait & Station: normal Assets  Assets: Manufacturing Systems Engineer; Desire for Improvement; Social Support    Physical Exam: Physical Exam Vitals and nursing note reviewed.    ROS Blood pressure (!) 152/69, pulse 98, temperature 98.3 F (36.8 C), resp. rate 18, height 5' 8 (1.727 m), weight 48.3 kg, SpO2 100%. Body mass index is 16.19 kg/m.  Diagnosis: Principal Problem:   Schizophrenia, paranoid (HCC) Major neurocognitive disorder-slums total score of 13/30   Treatment Plan Summary: APS referral has been  made as patient lacks capacity to make medical decisions.  Recommending legal guardianship and further placement at appropriate level of care  Safety and Monitoring:             -- Involuntary admission to inpatient psychiatric unit for safety, stabilization and treatment             -- Daily contact with  patient to assess and evaluate symptoms and progress in treatment             -- Patient's case to be discussed in multi-disciplinary team meeting             -- Observation Level: q15 minute checks             -- Vital signs:  q12 hours             -- Precautions: suicide, elopement, and assault   2. Psychiatric Diagnoses and Treatment:  Check Depakote  levels 11/20/2024-60 Invega  at 3 mg-plan to titrate it up after medical clearence           Depakote  changed to 500 mg every morning and 1500 mg nightly-in next 5 days 11/13/2024 Depakote  level-44 Zyprexa  20 mg nightly BMP-within normal limits Occupational Therapy evaluation for assessing appropriate level of care -- The risks/benefits/side-effects/alternatives to this medication were discussed in detail with the patient and time was given for questions. The patient consents to medication trial.                -- Metabolic profile and EKG monitoring obtained while on an atypical antipsychotic (BMI: Lipid Panel: HbgA1c: QTc:)              -- Encouraged patient to participate in unit milieu and in scheduled group therapies   Occupational Therapy recommendations If plan is discharge home, recommend the following:   Direct supervision/assist for medications management;Direct supervision/assist for financial management;Assist for transportation;Assistance with cooking/housework    Ms Harrington was seen for OT treatment on this date. Upon arrival to room pt in dayroom, agreeable to tx. The SLUMS is a 30-point screening questionnaire that tests orientation, memory, attention, problem solving, and executive function. Pt scored a 13/30 indicating Dementia. Of note, it is not within occupational therapy scope of practice to diagnose cognitive impairments, this screen indicates need for further testing. Pt with noted impairments in memory and problem solving limiting ability to participate functionally in medication management, bill management, and safety  cooking. Will continue to follow POC. Continue to recommend SUPERVISION for safety with iADLs.    [facility] Mental Status Examination Orientation: 2/3  Calculations: 0/3 Naming animals: 2/3 Patient named 14 animals (0 points is 0-4 animals; 1 is 5-9 animals; 2 is 10-14 animals; 3 is 15+ animals) Recall: 3/5  Attention: 1/2 Clock drawing: 1/4 Visual Processing: 2/2 Paragraph Memory: 2/8  Total: 13/30;  Given that patient has completed high school, this score falls in the dementia range.   4. Discharge Planning:   -- Social work and case management to assist with discharge planning and identification of hospital follow-up needs prior to discharge  -- Estimated LOS: 3-4 days  Millie JONELLE Manners, MD 12/08/2024, 5:46 PM

## 2024-12-17 NOTE — Group Note (Signed)
 [facility] LCSW Group Therapy Note   Group Date: 12/17/2024 Start Time: 1330 End Time: 1400  Type of Therapy and Topic:  Group Therapy:  Feelings around Relapse and Recovery  Participation Level:  Active   Mood:  Description of Group:    Patients in this group will discuss emotions they experience before and after a relapse. They will process how experiencing these feelings, or avoidance of experiencing them, relates to having a relapse. Facilitator will guide patients to explore emotions they have related to recovery. Patients will be encouraged to process which emotions are more powerful. They will be guided to discuss the emotional reaction significant others in their lives may have to patients relapse or recovery. Patients will be assisted in exploring ways to respond to the emotions of others without this contributing to a relapse.  Therapeutic Goals: Patient will identify two or more emotions that lead to relapse for them:  Patient will identify two emotions that result when they relapse:  Patient will identify two emotions related to recovery:  Patient will demonstrate ability to communicate their needs through discussion and/or role plays.   Summary of Patient Progress:  Pt was active and appropriate throughout group. Discussed positive ways to cope with traumatic experiences.    Therapeutic Modalities:   Cognitive Behavioral Therapy Solution-Focused Therapy Assertiveness Training Relapse Prevention Therapy   [name], LCSWA

## 2024-12-18 DIAGNOSIS — F0390 Unspecified dementia without behavioral disturbance: Secondary | ICD-10-CM | POA: Diagnosis not present

## 2024-12-18 DIAGNOSIS — F200 Paranoid schizophrenia: Secondary | ICD-10-CM | POA: Diagnosis not present

## 2024-12-18 NOTE — Progress Notes (Signed)
" °   12/17/24 2000  Psych Admission Type (Psych Patients Only)  Admission Status Involuntary  Psychosocial Assessment  Patient Complaints None  Eye Contact Fair  Facial Expression Animated  Affect Blunted  Speech Soft  Interaction Minimal  Motor Activity Other (Comment) (steady)  Appearance/Hygiene Unremarkable  Behavior Characteristics Cooperative;Appropriate to situation  Mood Pleasant  Thought Process  Coherency WDL  Content WDL  Delusions None reported or observed  Perception WDL  Hallucination None reported or observed  Judgment Impaired  Confusion Mild  Danger to Self  Current suicidal ideation? Denies  Agreement Not to Harm Self Yes  Description of Agreement verbal  Danger to Others  Danger to Others None reported or observed   Mood/Behavior:  Pleasant and cooperative.SABRA    Psych assessment: Denies SI/HI and AVH.     Interaction / Group attendance:  Present in the milieu. Minimal interaction with peers and staff.  Attended group.   Medication/ PRNs: Compliant with scheduled medications. Required PRNs Trazodone  for sleep and noted effective. Required prn Tylenol  for shoulder pain noted effective. PRN lozenge noted effective.   Pain: R shoulder pain    15 min checks in place for safety. "

## 2024-12-18 NOTE — Group Note (Signed)
 Date:  12/18/2024 Time:  9:42 PM  Group Topic/Focus:  Wrap-Up Group:   The focus of this group is to help patients review their daily goal of treatment and discuss progress on daily workbooks.    Participation Level:  Active  Participation Quality:  Appropriate  Affect:  Appropriate  Cognitive:  Alert  Insight: Appropriate  Engagement in Group:  Engaged  Modes of Intervention:  Activity  Additional Comments:    Shari [name] States Shari [name] 12/18/2024, 9:42 PM

## 2024-12-18 NOTE — Plan of Care (Signed)
   Problem: Education: Goal: Emotional status will improve Outcome: Progressing Goal: Mental status will improve Outcome: Progressing

## 2024-12-18 NOTE — Plan of Care (Signed)

## 2024-12-18 NOTE — Group Note (Signed)
 Date:  12/18/2024 Time:  4:01 PM  Group Topic/Focus:  Goals Group:   The focus of this group is to help patients establish daily goals to achieve during treatment and discuss how the patient can incorporate goal setting into their daily lives to aide in recovery.    Participation Level:  Active  Participation Quality:  Appropriate  Affect:  Appropriate  Cognitive:  Appropriate  Insight: Appropriate  Engagement in Group:  Engaged  Modes of Intervention:  Discussion   [name] 12/18/2024, 4:01 PM

## 2024-12-18 NOTE — Group Note (Signed)
 Date:  12/18/2024 Time:  10:22 AM  Group Topic/Focus:  Orientation:   The focus of this group is to educate the patient on the purpose and policies of crisis stabilization and provide a format to answer questions about their admission.  The group details unit policies and expectations of patients while admitted.  Effective geriatric exercises focus on aerobic activity, strength, balance, and flexibility, including brisk walking, swimming, chair yoga, tai chi, using resistance bands, and simple bodyweight moves (like chair squats, wall push-ups) to boost heart health, prevent falls, maintain independence, and improve overall well-being, aiming for 150 mins of moderate aerobic activity and 2+ days of strength training weekly.   Participation Level:  Did Not Attend   Harlene LITTIE Gavel 12/18/2024, 10:22 AM

## 2024-12-18 NOTE — [facility] Group Notes (Signed)
 Spirituality Group   Description: Participant directed exploration of values, beliefs and meaning   **Focus on Gratitude: Invite reflection on sources of gratitude (external/internal); goal to invite internal gratitude to foster 1) reconnection with life-giving activities 2) self-compassion.   Following a brief framework of chaplains role and ground rules of group behavior, participants are invited to share concerns or questions that engage spiritual life. Emphasis placed on common themes and shared experiences and ways to make meaning and clarify living into ones values.   Theory/Process/Goal: Utilize the theoretical framework of group therapy established by Celena Kite, Relational Cultural Theory and Rogerian approaches to facilitate relational empathy and use of the here and now to foster reflection, self-awareness, and sharing.   Observations: Ms Dannenberg was reserved as usual but did engage in the group discussion when encouraged/prompted.  Cherina Dhillon L. Delores HERO.Div

## 2024-12-18 NOTE — Progress Notes (Signed)
 SI/HI/AVH: denies all  Behavior/Mood: cooperative/pleasant    Interaction/Group attendance: minimal/ 2 of 3 groups   Medication/PRNs: compliant/none    Pain: 2/10 upper back pain  Other: discharge planning ongoing

## 2024-12-18 NOTE — Progress Notes (Signed)
 [facility] MD Progress Note   5:46 PM Shari [name]  MRN:  [id number] Patient is a 69 year old female with a history of Schizoaffective Disorder, Bipolar Type who presents via [facility] under IVC, initiated by CM, Falencio with re-entry program, to [facility] for assessment. Per IVC, [name] has been diagnosed with Schizophrenia and is refusing to take medication. [name] isn't sleeping. Residents report her screaming and yelling all night long.  [name] responding to internal stimuli.  [name] verbally aggressive.  The respondent has locked all of the residents inside of the transitional housing home.  They could not leave the home which prompted the IVC.  Residents did not have access to food, the restroom or anything as a result of being locked in.  [name] is currently under Adult protective services care. Patient is admitted to [facility] unit with Q15 min safety monitoring. Multidisciplinary team approach is offered. Medication management; group/milieu therapy is offered.   Subjective:  Chart reviewed, case discussed in multidisciplinary meeting, patient seen during rounds.   Patient is noted to be resting in bed.  She offers no complaints.  She denies SI/HI/plan and denies hallucinations.  She denies any muscle aches today.  [name] not using any lidocaine  patch for her back pain.  Per nursing patient comes out of her room for meals and to participate in groups.  She continues to request to get an update on her discharge planning.  Per social work team APS has not reached out with any group home interviews yet  Psychiatric History: see h&P Family History:  Family History  Problem Relation Age of Onset   Diabetes Mother    CAD Mother    Lung cancer Father    Social History:  Social History   Substance and Sexual Activity  Alcohol Use Yes   Comment: beer occasionally     Social History   Substance and Sexual Activity  Drug Use No    Social History   Socioeconomic  History   Marital status: Widowed    Spouse name: Not on file   Number of children: Not on file   Years of education: Not on file   Highest education level: Not on file  Occupational History   Not on file  Tobacco Use   Smoking status: Every Day    Current packs/day: 0.50    Average packs/day: 0.5 packs/day for 40.0 years (20.0 ttl pk-yrs)    Types: Cigarettes   Smokeless tobacco: Never  Vaping Use   Vaping status: Never Used  Substance and Sexual Activity   Alcohol use: Yes    Comment: beer occasionally   Drug use: No   Sexual activity: Not Currently    Birth control/protection: Post-menopausal  Other Topics Concern   Not on file  Social History Narrative   Not on file   Social Drivers of Health   Tobacco Use: High Risk (11/02/2024)   Patient History    Smoking Tobacco Use: Every Day    Smokeless Tobacco Use: Never    Passive Exposure: Not on file  Financial Resource Strain: Not on file  Food Insecurity: No Food Insecurity (10/17/2024)   Epic    Worried About Programme Researcher, Broadcasting/film/video in the Last Year: Never true    Ran Out of Food in the Last Year: Never true  Transportation Needs: No Transportation Needs (10/17/2024)   Epic    Lack of Transportation (Medical): No    Lack of Transportation (Non-Medical): No  Recent Concern: Transportation Needs - Unmet Transportation Needs (10/14/2024)   Epic    Lack of Transportation (Medical): Yes    Lack of Transportation (Non-Medical): Yes  Physical Activity: Not on file  Stress: Not on file  Social Connections: Unknown (10/17/2024)   Social Connection and Isolation Panel    Frequency of Communication with Friends and Family: Patient unable to answer    Frequency of Social Gatherings with Friends and Family: Patient unable to answer    Attends Religious Services: Patient unable to answer    Active Member of Clubs or Organizations: Patient declined    Attends Banker Meetings: Patient unable to answer    Marital  Status: Widowed  Depression (PHQ2-9): Not on file  Alcohol Screen: Low Risk (10/17/2024)   Alcohol Screen    Last Alcohol Screening Score (AUDIT): 0  Housing: Low Risk (10/17/2024)   Epic    Unable to Pay for Housing in the Last Year: No    Number of Times Moved in the Last Year: 0    Homeless in the Last Year: No  Utilities: Not At Risk (10/17/2024)   Epic    Threatened with loss of utilities: No  Health Literacy: Not on file   Past Medical History:  Past Medical History:  Diagnosis Date   Eye globe prosthesis    GERD (gastroesophageal reflux disease)    History of blood transfusion 1973   related to abscess burst in my stomach   Hyperlipemia    Hyperlipidemia    Hypertension    Osteoarthritis of back    Lowerback    SVD (spontaneous vaginal delivery)    x 1, baby died at 2 wks of age   Type II diabetes mellitus (HCC)     Past Surgical History:  Procedure Laterality Date   APPENDECTOMY     COLONOSCOPY     DILATATION & CURETTAGE/HYSTEROSCOPY WITH MYOSURE N/A 02/25/2015   Procedure: DILATATION & CURETTAGE/HYSTEROSCOPY WITH MYOSURE;  Surgeon: Truman Corona, MD;  Location: [facility];  Service: Gynecology;  Laterality: N/A;   DILATION AND CURETTAGE OF UTERUS     ENUCLEATION Right 03/16/2017   ENUCLEATION Right 03/16/2017   Procedure: ENUCLEATION RIGHT EYE;  Surgeon: Loyd Kathryne Palm, MD;  Location: [facility] OR;  Service: Ophthalmology;  Laterality: Right;   EYE SURGERY     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6, 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   EYE SURGERY Right ~ 1974   S/P initial eye injury; scissors stuck in my eye   LAPAROSCOPIC CHOLECYSTECTOMY     SHOULDER ARTHROSCOPY WITH ROTATOR CUFF REPAIR Left    wire stitches per patient   SHOULDER ARTHROSCOPY WITH ROTATOR CUFF REPAIR Right     Current Medications: Current Facility-Administered Medications  Medication Dose Route Frequency Provider Last Rate Last Admin   acetaminophen  (TYLENOL ) tablet 650 mg  650 mg Oral Q6H PRN Tex Drilling, NP   650 mg  at 12/07/24 2126   alum & mag hydroxide-simeth (MAALOX/MYLANTA) 200-200-20 MG/5ML suspension 30 mL  30 mL Oral Q4H PRN Tex Drilling, NP       cloNIDine  (CATAPRES ) tablet 0.1 mg  0.1 mg Oral BID PRN Tex Drilling, NP       divalproex  (DEPAKOTE ) DR tablet 1,500 mg  1,500 mg Oral QHS Deantre Bourdon, MD   1,500 mg at 12/07/24 2126   divalproex  (DEPAKOTE ) DR tablet 500 mg  500 mg Oral q AM Leron Stoffers, MD   500 mg at 12/08/24 9192   losartan  (COZAAR ) tablet  25 mg  25 mg Oral Daily Tex Drilling, NP   25 mg at 12/08/24 9071   magnesium  hydroxide (MILK OF MAGNESIA) suspension 30 mL  30 mL Oral Daily PRN Tex Drilling, NP       menthol  (CEPACOL) lozenge 3 mg  1 lozenge Oral PRN Bobbitt, Shalon E, NP   3 mg at 12/08/24 9065   methimazole  (TAPAZOLE ) tablet 10 mg  10 mg Oral Daily Nkwenti, Doris, NP   10 mg at 12/08/24 9071   OLANZapine  (ZYPREXA ) injection 5 mg  5 mg Intramuscular TID PRN Tex Drilling, NP       OLANZapine  (ZYPREXA ) injection 5 mg  5 mg Intramuscular TID PRN Tex Drilling, NP       OLANZapine  (ZYPREXA ) tablet 20 mg  20 mg Oral QHS Minda Faas, MD   20 mg at 12/07/24 2127   OLANZapine  zydis (ZYPREXA ) disintegrating tablet 5 mg  5 mg Oral TID PRN Tex Drilling, NP       OLANZapine  zydis (ZYPREXA ) disintegrating tablet 5 mg  5 mg Oral TID PRN Tex Drilling, NP       paliperidone  (INVEGA ) 24 hr tablet 3 mg  3 mg Oral Daily Shrivastava, Aryendra, MD   3 mg at 12/08/24 9071   pantoprazole  (PROTONIX ) EC tablet 40 mg  40 mg Oral Daily Nkwenti, Doris, NP   40 mg at 12/08/24 9071   traZODone  (DESYREL ) tablet 50 mg  50 mg Oral QHS PRN Tex Drilling, NP   50 mg at 12/06/24 2109    Lab Results:  No results found for this or any previous visit (from the past 48 hours).       Blood Alcohol level:  Lab Results  Component Value Date   [facility] <15 10/14/2024   ETH <10 07/02/2022    Metabolic Disorder Labs: Lab Results  Component Value Date   HGBA1C 5.5 10/14/2024   MPG 111.15  10/14/2024   MPG 137 01/06/2022   Lab Results  Component Value Date   PROLACTIN 5.8 10/14/2024   Lab Results  Component Value Date   CHOL 167 10/14/2024   TRIG 68 10/14/2024   HDL 60 10/14/2024   CHOLHDL 2.8 10/14/2024   VLDL 14 10/14/2024   LDLCALC 93 10/14/2024   LDLCALC 73 03/13/2024    Physical Findings: AIMS:  , ,  ,  ,    CIWA:    COWS:      Psychiatric Specialty Exam:  Presentation  General Appearance:  Appropriate for Environment  Eye Contact: Fleeting  Speech: Normal Rate  Speech Volume: Decreased    Mood and Affect  Mood:fine  Affect: Flat   Thought Process  Thought Processes: impoverished  Orientation:Partial  Thought Content: Chronic delusions Hallucinations: Denies  Ideas of Reference:Delusions; Paranoia  Suicidal Thoughts: Denies  Homicidal Thoughts: Denies   Sensorium  Memory: Immediate Fair; Recent Fair  Judgment: Impaired  Insight: Shallow   Executive Functions  Concentration: Fair  Attention Span: Fair  Recall: Poor  Fund of Knowledge: Fair  Language: Fair   Psychomotor Activity  Psychomotor Activity: No data recorded  Musculoskeletal: Strength & Muscle Tone: within normal limits Gait & Station: normal Assets  Assets: Manufacturing Systems Engineer; Desire for Improvement; Social Support    Physical Exam: Physical Exam Vitals and nursing note reviewed.    ROS Blood pressure (!) 152/69, pulse 98, temperature 98.3 F (36.8 C), resp. rate 18, height 5' 8 (1.727 m), weight 48.3 kg, SpO2 100%. Body mass index is 16.19 kg/m.  Diagnosis:  Principal Problem:   Schizophrenia, paranoid (HCC) Major neurocognitive disorder-slums total score of 13/30   Treatment Plan Summary: APS referral has been made as patient lacks capacity to make medical decisions.  Recommending legal guardianship and further placement at appropriate level of care  Safety and Monitoring:             -- Involuntary admission to  inpatient psychiatric unit for safety, stabilization and treatment             -- Daily contact with patient to assess and evaluate symptoms and progress in treatment             -- Patient's case to be discussed in multi-disciplinary team meeting             -- Observation Level: q15 minute checks             -- Vital signs:  q12 hours             -- Precautions: suicide, elopement, and assault   2. Psychiatric Diagnoses and Treatment:  Check Depakote  levels 11/20/2024-60 Invega  at 3 mg-plan to titrate it up after medical clearence           Depakote  changed to 500 mg every morning and 1500 mg nightly-in next 5 days 11/13/2024 Depakote  level-44 Zyprexa  20 mg nightly BMP-within normal limits Occupational Therapy evaluation for assessing appropriate level of care -- The risks/benefits/side-effects/alternatives to this medication were discussed in detail with the patient and time was given for questions. The patient consents to medication trial.                -- Metabolic profile and EKG monitoring obtained while on an atypical antipsychotic (BMI: Lipid Panel: HbgA1c: QTc:)              -- Encouraged patient to participate in unit milieu and in scheduled group therapies   Occupational Therapy recommendations If plan is discharge home, recommend the following:   Direct supervision/assist for medications management;Direct supervision/assist for financial management;Assist for transportation;Assistance with cooking/housework    Ms Guarino was seen for OT treatment on this date. Upon arrival to room pt in dayroom, agreeable to tx. The SLUMS is a 30-point screening questionnaire that tests orientation, memory, attention, problem solving, and executive function. Pt scored a 13/30 indicating Dementia. Of note, it is not within occupational therapy scope of practice to diagnose cognitive impairments, this screen indicates need for further testing. Pt with noted impairments in memory and problem solving  limiting ability to participate functionally in medication management, bill management, and safety cooking. Will continue to follow POC. Continue to recommend SUPERVISION for safety with iADLs.    [facility] Mental Status Examination Orientation: 2/3  Calculations: 0/3 Naming animals: 2/3 Patient named 14 animals (0 points is 0-4 animals; 1 is 5-9 animals; 2 is 10-14 animals; 3 is 15+ animals) Recall: 3/5  Attention: 1/2 Clock drawing: 1/4 Visual Processing: 2/2 Paragraph Memory: 2/8  Total: 13/30;  Given that patient has completed high school, this score falls in the dementia range.   4. Discharge Planning:   -- Social work and case management to assist with discharge planning and identification of hospital follow-up needs prior to discharge  -- Estimated LOS: 3-4 days  Millie JONELLE Manners, MD 12/08/2024, 5:46 PM

## 2024-12-18 NOTE — BHH Counselor (Signed)
 CSW contacted [name], APS social worker about placement updates for pt.   CSW awaits updates at this time.   Lum Croft, MSW, [place] 12/18/2024 9:37 AM

## 2024-12-19 DIAGNOSIS — F0390 Unspecified dementia without behavioral disturbance: Secondary | ICD-10-CM | POA: Diagnosis not present

## 2024-12-19 DIAGNOSIS — F200 Paranoid schizophrenia: Secondary | ICD-10-CM | POA: Diagnosis not present

## 2024-12-19 NOTE — Plan of Care (Signed)
  Problem: Education: Goal: Emotional status will improve Outcome: Progressing   Problem: Activity: Goal: Interest or engagement in activities will improve Outcome: Progressing Goal: Sleeping patterns will improve Outcome: Progressing

## 2024-12-19 NOTE — Progress Notes (Signed)
" °   12/19/24 0250  Psych Admission Type (Psych Patients Only)  Admission Status Involuntary  Psychosocial Assessment  Patient Complaints None  Eye Contact Fair  Facial Expression Flat  Affect Appropriate to circumstance  Speech Soft  Interaction Minimal  Motor Activity Other (Comment) (WDL)  Appearance/Hygiene Unremarkable  Behavior Characteristics Cooperative  Mood Pleasant  Aggressive Behavior  Effect No apparent injury  Thought Process  Coherency WDL  Content WDL  Delusions None reported or observed  Perception WDL  Hallucination None reported or observed  Judgment Impaired  Confusion Mild  Danger to Self  Current suicidal ideation? Denies  Agreement Not to Harm Self Yes  Description of Agreement Verbal  Danger to Others  Danger to Others None reported or observed    "

## 2024-12-19 NOTE — BH IP Treatment Plan (Signed)
 Interdisciplinary Treatment and Diagnostic Plan Update  12/19/2024 Time of Session: 3:00PM Shari [name] MRN: [id number]  Principal Diagnosis: Schizophrenia, paranoid (HCC)  Secondary Diagnoses: Principal Problem:   Schizophrenia, paranoid (HCC)   Current Medications:  Current Facility-Administered Medications  Medication Dose Route Frequency Provider Last Rate Last Admin   acetaminophen  (TYLENOL ) tablet 650 mg  650 mg Oral Q6H PRN Tex Drilling, NP   650 mg at 12/17/24 2129   alum & mag hydroxide-simeth (MAALOX/MYLANTA) 200-200-20 MG/5ML suspension 30 mL  30 mL Oral Q4H PRN Nkwenti, Drilling, NP       cloNIDine  (CATAPRES ) tablet 0.1 mg  0.1 mg Oral BID PRN Tex Drilling, NP       diclofenac  Sodium (VOLTAREN ) 1 % topical gel 4 g  4 g Topical QID PRN Jadapalle, Sree, MD       divalproex  (DEPAKOTE ) DR tablet 1,500 mg  1,500 mg Oral QHS Jadapalle, Sree, MD   1,500 mg at 12/18/24 2131   divalproex  (DEPAKOTE ) DR tablet 500 mg  500 mg Oral q AM Jadapalle, Sree, MD   500 mg at 12/19/24 0756   famotidine  (PEPCID ) 40 MG/5ML suspension 40 mg  40 mg Oral Daily Jadapalle, Sree, MD   40 mg at 12/19/24 9071   lidocaine  (LIDODERM ) 5 % 1 patch  1 patch Transdermal Q24H Laurita Manor T, MD   1 patch at 12/19/24 1313   losartan  (COZAAR ) tablet 25 mg  25 mg Oral Daily Nkwenti, Doris, NP   25 mg at 12/19/24 9071   magnesium  hydroxide (MILK OF MAGNESIA) suspension 30 mL  30 mL Oral Daily PRN Tex Drilling, NP       menthol  (CEPACOL) lozenge 3 mg  1 lozenge Oral PRN Bobbitt, Shalon E, NP   3 mg at 12/17/24 2130   methimazole  (TAPAZOLE ) tablet 10 mg  10 mg Oral Daily Nkwenti, Doris, NP   10 mg at 12/19/24 9071   OLANZapine  (ZYPREXA ) injection 5 mg  5 mg Intramuscular TID PRN Tex Drilling, NP       OLANZapine  (ZYPREXA ) injection 5 mg  5 mg Intramuscular TID PRN Tex Drilling, NP       OLANZapine  (ZYPREXA ) tablet 20 mg  20 mg Oral QHS Jadapalle, Sree, MD   20 mg at 12/18/24 2133   OLANZapine  zydis (ZYPREXA )  disintegrating tablet 5 mg  5 mg Oral TID PRN Tex Drilling, NP       OLANZapine  zydis (ZYPREXA ) disintegrating tablet 5 mg  5 mg Oral TID PRN Tex Drilling, NP       paliperidone  (INVEGA ) 24 hr tablet 3 mg  3 mg Oral Daily Shrivastava, Aryendra, MD   3 mg at 12/19/24 9071   pantoprazole  (PROTONIX ) EC tablet 40 mg  40 mg Oral BID AC Zhang, Ping T, MD   40 mg at 12/19/24 0756   sucralfate  (CARAFATE ) 1 GM/10ML suspension 1 g  1 g Oral TID WC & HS Laurita Manor T, MD   1 g at 12/19/24 1213   traZODone  (DESYREL ) tablet 50 mg  50 mg Oral QHS PRN Tex Drilling, NP   50 mg at 12/18/24 2132   PTA Medications: Medications Prior to Admission  Medication Sig Dispense Refill Last Dose/Taking   Ascorbic Acid (VITAMIN C PO) Take 1 tablet by mouth daily. (Patient not taking: Reported on 10/14/2024)      divalproex  (DEPAKOTE ) 500 MG DR tablet Take 1 tablet (500 mg total) by mouth at bedtime. (Patient not taking: Reported on 10/14/2024) 30 tablet 1  losartan  (COZAAR ) 25 MG tablet Take 1 tablet (25 mg total) by mouth daily. (Patient not taking: Reported on 10/14/2024) 90 tablet 1    methimazole  (TAPAZOLE ) 10 MG tablet Take 1 tablet (10 mg total) by mouth daily. (Patient not taking: Reported on 10/14/2024) 90 tablet 1    OLANZapine  (ZYPREXA ) 10 MG tablet Take 1 tablet (10 mg total) by mouth at bedtime. (Patient not taking: Reported on 10/14/2024) 30 tablet 1    pantoprazole  (PROTONIX ) 40 MG tablet Take 1 tablet (40 mg total) by mouth daily. (Patient not taking: Reported on 10/14/2024) 90 tablet 0    propranolol  (INDERAL ) 10 MG tablet Take 1 tablet (10 mg total) by mouth 2 (two) times daily. (Patient not taking: Reported on 10/14/2024) 180 tablet 1    VITAMIN D PO Take 1 tablet by mouth daily. (Patient not taking: Reported on 10/14/2024)      VITAMIN E PO Take 1 tablet by mouth daily. (Patient not taking: Reported on 10/14/2024)       Patient Stressors: Medication change or noncompliance    Patient Strengths:  Communication skills   Treatment Modalities: Medication Management, Group therapy, Case management,  1 to 1 session with clinician, Psychoeducation, Recreational therapy.   Physician Treatment Plan for Primary Diagnosis: Schizophrenia, paranoid (HCC) Long Term Goal(s): Improvement in symptoms so as ready for discharge   Short Term Goals: Ability to identify changes in lifestyle to reduce recurrence of condition will improve Ability to verbalize feelings will improve Ability to disclose and discuss suicidal ideas Ability to demonstrate self-control will improve Ability to identify and develop effective coping behaviors will improve Ability to maintain clinical measurements within normal limits will improve  Medication Management: Evaluate patient's response, side effects, and tolerance of medication regimen.  Therapeutic Interventions: 1 to 1 sessions, Unit Group sessions and Medication administration.  Evaluation of Outcomes: Progressing  Physician Treatment Plan for Secondary Diagnosis: Principal Problem:   Schizophrenia, paranoid (HCC)  Long Term Goal(s): Improvement in symptoms so as ready for discharge   Short Term Goals: Ability to identify changes in lifestyle to reduce recurrence of condition will improve Ability to verbalize feelings will improve Ability to disclose and discuss suicidal ideas Ability to demonstrate self-control will improve Ability to identify and develop effective coping behaviors will improve Ability to maintain clinical measurements within normal limits will improve     Medication Management: Evaluate patient's response, side effects, and tolerance of medication regimen.  Therapeutic Interventions: 1 to 1 sessions, Unit Group sessions and Medication administration.  Evaluation of Outcomes: Progressing   RN Treatment Plan for Primary Diagnosis: Schizophrenia, paranoid (HCC) Long Term Goal(s): Knowledge of disease and therapeutic regimen to maintain  health will improve  Short Term Goals: Ability to demonstrate self-control, Ability to participate in decision making will improve, Ability to verbalize feelings will improve, Ability to disclose and discuss suicidal ideas, Ability to identify and develop effective coping behaviors will improve, and Compliance with prescribed medications will improve  Medication Management: RN will administer medications as ordered by provider, will assess and evaluate patient's response and provide education to patient for prescribed medication. RN will report any adverse and/or side effects to prescribing provider.  Therapeutic Interventions: 1 on 1 counseling sessions, Psychoeducation, Medication administration, Evaluate responses to treatment, Monitor vital signs and CBGs as ordered, Perform/monitor CIWA, COWS, AIMS and Fall Risk screenings as ordered, Perform wound care treatments as ordered.  Evaluation of Outcomes: Progressing   LCSW Treatment Plan for Primary Diagnosis: Schizophrenia, paranoid (HCC) Long Term  Goal(s): Safe transition to appropriate next level of care at discharge, Engage patient in therapeutic group addressing interpersonal concerns.  Short Term Goals: Engage patient in aftercare planning with referrals and resources, Increase social support, Increase ability to appropriately verbalize feelings, Increase emotional regulation, Facilitate acceptance of mental health diagnosis and concerns, and Increase skills for wellness and recovery  Therapeutic Interventions: Assess for all discharge needs, 1 to 1 time with Social worker, Explore available resources and support systems, Assess for adequacy in community support network, Educate family and significant other(s) on suicide prevention, Complete Psychosocial Assessment, Interpersonal group therapy.  Evaluation of Outcomes: Progressing   Progress in Treatment: Attending groups: Yes. Participating in groups: Yes. Taking medication as  prescribed: Yes. Toleration medication: Yes. Family/Significant other contact made: Yes, individual(s) contacted:  Completed with APS Patient understands diagnosis: No. Discussing patient identified problems/goals with staff: Yes. Medical problems stabilized or resolved: Yes. Denies suicidal/homicidal ideation: Yes. Issues/concerns per patient self-inventory: No. Other: none  New problem(s) identified: 12/14/24 no new changes New Short Term/Long Term Goal(s):Update 12/14/2024 no new changes  Update 12/19/2024: No changes at this time.    Patient Goals:    I haven't set any goals for the hospital yet. I don't need psychiatric help 10/23/24 Update: No changes at this time. 10/28/24 Update: No changes at this time. Update 11/03/24: No changes at this time Update 11/09/24: No changes at this time Update 11/14/24: No changes at this time2/21/25 Update: No changes at this time. Update 11/29/2024: No changes at this time.   Update 12/04/2024: No changes at this time. Update 12/09/2024: No changes at this time.   12/14/24 no new changes  Update 12/19/2024: No changes at this time.    Discharge Plan or Barriers: CSW will assist with appropriate discharge plan 10/23/24 Update: No changes at this time. 10/28/24 Update: CSW to continue to meet with DSS to engage in safe discharge planning and connect patient with appropriate resources. Update 11/03/24: No changes at this time Update 11/09/24: No changes at this time Update 11/14/24: APS drafting a petition for guardianship, awaiting updates regarding scheduled court date at this time. APS has received FL2 to begin placement search. 11/24/24 Update: No changes at this time.  Update 11/29/2024: Placement continues to be sought at this time.  Update 12/04/2024: No updates on placement search at this time Update 12/09/2024: No changes at this time.  Update:12/14/24 no new changes  Update 12/19/2024: No changes at this time.      Reason for Continuation of  Hospitalization: Delusions  12/14/24 no changes   Estimated Length of Stay:12/14/2024 TBD  Update 12/19/2024: TBD  Last 3 [place] Suicide Severity Risk Score: Flowsheet Row Admission (Current) from 10/17/2024 in [facility] from 10/14/2024 in [facility] ED to Hosp-Admission (Discharged) from 03/13/2024 in [facility]  C-SSRS RISK CATEGORY No Risk No Risk No Risk    Last PHQ 2/9 Scores:     No data to display          Scribe for Treatment Team: Sherryle JINNY Margo, KEN 12/19/2024 4:21 PM

## 2024-12-19 NOTE — Plan of Care (Signed)

## 2024-12-19 NOTE — Progress Notes (Addendum)
 Patient pleasant during interaction. Accepted medications without issue. Denies SI/HI/AVH. Initiating conversation about discharge today. Observed in the dayroom for group and meals. Ongoing monitoring continues.   12/19/24 1100  Psych Admission Type (Psych Patients Only)  Admission Status Involuntary  Psychosocial Assessment  Patient Complaints None  Eye Contact Fair  Facial Expression Flat  Affect Appropriate to circumstance  Speech Soft  Interaction Minimal  Motor Activity Other (Comment) (WDL)  Appearance/Hygiene Unremarkable  Behavior Characteristics Cooperative  Mood Pleasant  Thought Process  Coherency WDL  Content WDL  Delusions None reported or observed  Perception WDL  Hallucination None reported or observed  Judgment Impaired  Confusion None  Danger to Self  Current suicidal ideation? Denies  Danger to Others  Danger to Others None reported or observed

## 2024-12-19 NOTE — Progress Notes (Signed)
 [facility] MD Progress Note   5:46 PM Shari [name]  MRN:  [id number] Patient is a 69 year old female with a history of Schizoaffective Disorder, Bipolar Type who presents via [employer] under IVC, initiated by CM, Falencio with re-entry program, to [facility] for assessment. Per IVC, [name] has been diagnosed with Schizophrenia and is refusing to take medication. [name] isn't sleeping. Residents report her screaming and yelling all night long.  [name] responding to internal stimuli.  [name] verbally aggressive.  The respondent has locked all of the residents inside of the transitional housing home.  They could not leave the home which prompted the IVC.  Residents did not have access to food, the restroom or anything as a result of being locked in.  [name] is currently under Adult protective services care. Patient is admitted to [facility] unit with Q15 min safety monitoring. Multidisciplinary team approach is offered. Medication management; group/milieu therapy is offered.   Subjective:  Chart reviewed, case discussed in multidisciplinary meeting, patient seen during rounds.  Patient is noted to be resting in bed.  She offers no physical complaints.  She reports her muscle aches and chest pain has resolved and is feeling a lot better.  She denies SI/HI/plan and denies hallucinations.   Psychiatric History: see h&P Family History:  Family History  Problem Relation Age of Onset   Diabetes Mother    CAD Mother    Lung cancer Father    Social History:  Social History   Substance and Sexual Activity  Alcohol Use Yes   Comment: beer occasionally     Social History   Substance and Sexual Activity  Drug Use No    Social History   Socioeconomic History   Marital status: Widowed    Spouse name: Not on file   Number of children: Not on file   Years of education: Not on file   Highest education level: Not on file  Occupational History   Not on file  Tobacco Use    Smoking status: Every Day    Current packs/day: 0.50    Average packs/day: 0.5 packs/day for 40.0 years (20.0 ttl pk-yrs)    Types: Cigarettes   Smokeless tobacco: Never  Vaping Use   Vaping status: Never Used  Substance and Sexual Activity   Alcohol use: Yes    Comment: beer occasionally   Drug use: No   Sexual activity: Not Currently    Birth control/protection: Post-menopausal  Other Topics Concern   Not on file  Social History Narrative   Not on file   Social Drivers of Health   Tobacco Use: High Risk (11/02/2024)   Patient History    Smoking Tobacco Use: Every Day    Smokeless Tobacco Use: Never    Passive Exposure: Not on file  Financial Resource Strain: Not on file  Food Insecurity: No Food Insecurity (10/17/2024)   Epic    Worried About Programme Researcher, Broadcasting/film/video in the Last Year: Never true    Ran Out of Food in the Last Year: Never true  Transportation Needs: No Transportation Needs (10/17/2024)   Epic    Lack of Transportation (Medical): No    Lack of Transportation (Non-Medical): No  Recent Concern: Transportation Needs - Unmet Transportation Needs (10/14/2024)   Epic    Lack of Transportation (Medical): Yes    Lack of Transportation (Non-Medical): Yes  Physical Activity: Not on file  Stress: Not on file  Social Connections: Unknown (10/17/2024)  Social Connection and Isolation Panel    Frequency of Communication with Friends and Family: Patient unable to answer    Frequency of Social Gatherings with Friends and Family: Patient unable to answer    Attends Religious Services: Patient unable to answer    Active Member of Clubs or Organizations: Patient declined    Attends Banker Meetings: Patient unable to answer    Marital Status: Widowed  Depression (PHQ2-9): Not on file  Alcohol Screen: Low Risk (10/17/2024)   Alcohol Screen    Last Alcohol Screening Score (AUDIT): 0  Housing: Low Risk (10/17/2024)   Epic    Unable to Pay for Housing in the  Last Year: No    Number of Times Moved in the Last Year: 0    Homeless in the Last Year: No  Utilities: Not At Risk (10/17/2024)   Epic    Threatened with loss of utilities: No  Health Literacy: Not on file   Past Medical History:  Past Medical History:  Diagnosis Date   Eye globe prosthesis    GERD (gastroesophageal reflux disease)    History of blood transfusion 1973   related to abscess burst in my stomach   Hyperlipemia    Hyperlipidemia    Hypertension    Osteoarthritis of back    Lowerback    SVD (spontaneous vaginal delivery)    x 1, baby died at 2 wks of age   Type II diabetes mellitus (HCC)     Past Surgical History:  Procedure Laterality Date   APPENDECTOMY     COLONOSCOPY     DILATATION & CURETTAGE/HYSTEROSCOPY WITH MYOSURE N/A 02/25/2015   Procedure: DILATATION & CURETTAGE/HYSTEROSCOPY WITH MYOSURE;  Surgeon: Truman Corona, MD;  Location: [facility];  Service: Gynecology;  Laterality: N/A;   DILATION AND CURETTAGE OF UTERUS     ENUCLEATION Right 03/16/2017   ENUCLEATION Right 03/16/2017   Procedure: ENUCLEATION RIGHT EYE;  Surgeon: Loyd Kathryne Palm, MD;  Location: [facility] OR;  Service: Ophthalmology;  Laterality: Right;   EYE SURGERY     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6, 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   EYE SURGERY Right ~ 1974   S/P initial eye injury; scissors stuck in my eye   LAPAROSCOPIC CHOLECYSTECTOMY     SHOULDER ARTHROSCOPY WITH ROTATOR CUFF REPAIR Left    wire stitches per patient   SHOULDER ARTHROSCOPY WITH ROTATOR CUFF REPAIR Right     Current Medications: Current Facility-Administered Medications  Medication Dose Route Frequency Provider Last Rate Last Admin   acetaminophen  (TYLENOL ) tablet 650 mg  650 mg Oral Q6H PRN Tex Drilling, NP   650 mg at 12/07/24 2126   alum & mag hydroxide-simeth (MAALOX/MYLANTA) 200-200-20 MG/5ML suspension 30 mL  30 mL Oral Q4H PRN Tex Drilling, NP       cloNIDine  (CATAPRES ) tablet 0.1 mg  0.1 mg Oral BID PRN Tex Drilling, NP        divalproex  (DEPAKOTE ) DR tablet 1,500 mg  1,500 mg Oral QHS Jory Welke, MD   1,500 mg at 12/07/24 2126   divalproex  (DEPAKOTE ) DR tablet 500 mg  500 mg Oral q AM Rashidah Belleville, MD   500 mg at 12/08/24 9192   losartan  (COZAAR ) tablet 25 mg  25 mg Oral Daily Nkwenti, Doris, NP   25 mg at 12/08/24 9071   magnesium  hydroxide (MILK OF MAGNESIA) suspension 30 mL  30 mL Oral Daily PRN Tex Drilling, NP       menthol  (CEPACOL) lozenge 3  mg  1 lozenge Oral PRN Bobbitt, Shalon E, NP   3 mg at 12/08/24 0934   methimazole  (TAPAZOLE ) tablet 10 mg  10 mg Oral Daily Nkwenti, Doris, NP   10 mg at 12/08/24 9071   OLANZapine  (ZYPREXA ) injection 5 mg  5 mg Intramuscular TID PRN Tex Drilling, NP       OLANZapine  (ZYPREXA ) injection 5 mg  5 mg Intramuscular TID PRN Tex Drilling, NP       OLANZapine  (ZYPREXA ) tablet 20 mg  20 mg Oral QHS Taavi Hoose, MD   20 mg at 12/07/24 2127   OLANZapine  zydis (ZYPREXA ) disintegrating tablet 5 mg  5 mg Oral TID PRN Tex Drilling, NP       OLANZapine  zydis (ZYPREXA ) disintegrating tablet 5 mg  5 mg Oral TID PRN Tex Drilling, NP       paliperidone  (INVEGA ) 24 hr tablet 3 mg  3 mg Oral Daily Shrivastava, Aryendra, MD   3 mg at 12/08/24 9071   pantoprazole  (PROTONIX ) EC tablet 40 mg  40 mg Oral Daily Nkwenti, Doris, NP   40 mg at 12/08/24 9071   traZODone  (DESYREL ) tablet 50 mg  50 mg Oral QHS PRN Tex Drilling, NP   50 mg at 12/06/24 2109    Lab Results:  No results found for this or any previous visit (from the past 48 hours).       Blood Alcohol level:  Lab Results  Component Value Date   [facility] <15 10/14/2024   ETH <10 07/02/2022    Metabolic Disorder Labs: Lab Results  Component Value Date   HGBA1C 5.5 10/14/2024   MPG 111.15 10/14/2024   MPG 137 01/06/2022   Lab Results  Component Value Date   PROLACTIN 5.8 10/14/2024   Lab Results  Component Value Date   CHOL 167 10/14/2024   TRIG 68 10/14/2024   HDL 60 10/14/2024   CHOLHDL 2.8  10/14/2024   VLDL 14 10/14/2024   LDLCALC 93 10/14/2024   LDLCALC 73 03/13/2024    Physical Findings: AIMS:  , ,  ,  ,    CIWA:    COWS:      Psychiatric Specialty Exam:  Presentation  General Appearance:  Appropriate for Environment  Eye Contact: Fleeting  Speech: Normal Rate  Speech Volume: Decreased    Mood and Affect  Mood:fine  Affect: Flat   Thought Process  Thought Processes: impoverished  Orientation:Partial  Thought Content: Chronic delusions Hallucinations: Denies  Ideas of Reference:Delusions; Paranoia  Suicidal Thoughts: Denies  Homicidal Thoughts: Denies   Sensorium  Memory: Immediate Fair; Recent Fair  Judgment: Impaired  Insight: Shallow   Executive Functions  Concentration: Fair  Attention Span: Fair  Recall: Poor  Fund of Knowledge: Fair  Language: Fair   Psychomotor Activity  Psychomotor Activity: No data recorded  Musculoskeletal: Strength & Muscle Tone: within normal limits Gait & Station: normal Assets  Assets: Manufacturing Systems Engineer; Desire for Improvement; Social Support    Physical Exam: Physical Exam Vitals and nursing note reviewed.    ROS Blood pressure (!) 152/69, pulse 98, temperature 98.3 F (36.8 C), resp. rate 18, height 5' 8 (1.727 m), weight 48.3 kg, SpO2 100%. Body mass index is 16.19 kg/m.  Diagnosis: Principal Problem:   Schizophrenia, paranoid (HCC) Major neurocognitive disorder-slums total score of 13/30   Treatment Plan Summary: APS referral has been made as patient lacks capacity to make medical decisions.  Recommending legal guardianship and further placement at appropriate level of care  Safety  and Monitoring:             -- Involuntary admission to inpatient psychiatric unit for safety, stabilization and treatment             -- Daily contact with patient to assess and evaluate symptoms and progress in treatment             -- Patient's case to be discussed in  multi-disciplinary team meeting             -- Observation Level: q15 minute checks             -- Vital signs:  q12 hours             -- Precautions: suicide, elopement, and assault   2. Psychiatric Diagnoses and Treatment:  Check Depakote  levels 11/20/2024-60 Invega  at 3 mg-plan to titrate it up after medical clearence           Depakote  changed to 500 mg every morning and 1500 mg nightly-in next 5 days 11/13/2024 Depakote  level-44 Zyprexa  20 mg nightly BMP-within normal limits Occupational Therapy evaluation for assessing appropriate level of care -- The risks/benefits/side-effects/alternatives to this medication were discussed in detail with the patient and time was given for questions. The patient consents to medication trial.                -- Metabolic profile and EKG monitoring obtained while on an atypical antipsychotic (BMI: Lipid Panel: HbgA1c: QTc:)              -- Encouraged patient to participate in unit milieu and in scheduled group therapies   Occupational Therapy recommendations If plan is discharge home, recommend the following:   Direct supervision/assist for medications management;Direct supervision/assist for financial management;Assist for transportation;Assistance with cooking/housework    Ms Beitler was seen for OT treatment on this date. Upon arrival to room pt in dayroom, agreeable to tx. The SLUMS is a 30-point screening questionnaire that tests orientation, memory, attention, problem solving, and executive function. Pt scored a 13/30 indicating Dementia. Of note, it is not within occupational therapy scope of practice to diagnose cognitive impairments, this screen indicates need for further testing. Pt with noted impairments in memory and problem solving limiting ability to participate functionally in medication management, bill management, and safety cooking. Will continue to follow POC. Continue to recommend SUPERVISION for safety with iADLs.    [facility] Mental Status Examination Orientation: 2/3  Calculations: 0/3 Naming animals: 2/3 Patient named 14 animals (0 points is 0-4 animals; 1 is 5-9 animals; 2 is 10-14 animals; 3 is 15+ animals) Recall: 3/5  Attention: 1/2 Clock drawing: 1/4 Visual Processing: 2/2 Paragraph Memory: 2/8  Total: 13/30;  Given that patient has completed high school, this score falls in the dementia range.   4. Discharge Planning:   -- Social work and case management to assist with discharge planning and identification of hospital follow-up needs prior to discharge  -- Estimated LOS: 3-4 days  Millie JONELLE Manners, MD 12/08/2024, 5:46 PM

## 2024-12-19 NOTE — Group Note (Signed)
 Date:  12/19/2024 Time:  8:42 PM  Group Topic/Focus:  Identifying Needs:   The focus of this group is to help patients identify their personal needs that have been historically problematic and identify healthy behaviors to address their needs.    Participation Level:  Active  Participation Quality:  Appropriate  Affect:  Appropriate  Cognitive:  Appropriate  Insight: Appropriate and Good  Engagement in Group:  Engaged  Modes of Intervention:  Discussion  Additional Comments:    Romero Earnie Hope 12/19/2024, 8:42 PM

## 2024-12-19 NOTE — Group Note (Signed)
 Date:  12/19/2024 Time:  10:22 AM  Group Topic/Focus:  Orientation:   The focus of this group is to educate the patient on the purpose and policies of crisis stabilization and provide a format to answer questions about their admission.  The group details unit policies and expectations of patients while admitted.  Exercise for geriatric patients offers vast benefits, significantly improving physical function, mental health, and independence by reducing fall risk, managing chronic diseases (heart disease, diabetes, cancer), enhancing cognitive function, boosting mood, improving sleep, strengthening bones, and supporting longer, healthier lives. Regular activity helps combat age-related decline, preventing frailty and enabling older adults to live more independently and with better overall quality of life.   Participation Level:  Active  Participation Quality:  Appropriate  Affect:  Appropriate  Cognitive:  Appropriate  Insight: Appropriate  Engagement in Group:  Engaged  Modes of Intervention:  Activity, Discussion, and Education  Additional Comments:  N/A  Shari [name] Gavel 12/19/2024, 10:22 AM

## 2024-12-19 NOTE — Group Note (Signed)
 Recreation Therapy Group Note   Group Topic:General Recreation  Group Date: 12/19/2024 Start Time: 1400 End Time: 1450 Facilitators: Celestia Jeoffrey BRAVO, LRT, CTRS Location: Dayroom  Group Description: Bingo. Patients played multiple rounds of bingo. LRT and pts discussed the definition of leisure, things they do in their free time outside of the hospital, and how bingo is also a leisure activity. Pts received a coloring book, word search book, or journal as a prize.    Goal Area(s) Addressed:  Patient will identify a current leisure interest.  Patient will learn the definition of leisure. Patient will have the opportunity to try a new leisure activity. Patient will communicate with peers and LRT.   Affect/Mood: N/A   Participation Level: Did not attend    Clinical Observations/Individualized Feedback: Patient did not attend.  Plan: Continue to engage patient in RT group sessions 2-3x/week.   Jeoffrey BRAVO Celestia, LRT, CTRS 12/19/2024 4:41 PM

## 2024-12-20 DIAGNOSIS — F200 Paranoid schizophrenia: Secondary | ICD-10-CM | POA: Diagnosis not present

## 2024-12-20 DIAGNOSIS — F0390 Unspecified dementia without behavioral disturbance: Secondary | ICD-10-CM | POA: Diagnosis not present

## 2024-12-20 MED ORDER — PALIPERIDONE ER 3 MG PO TB24
6.0000 mg | ORAL_TABLET | Freq: Every day | ORAL | Status: AC
  Administered 2024-12-21 – 2025-01-10 (×21): 6 mg via ORAL
  Filled 2024-12-20 (×21): qty 2

## 2024-12-20 NOTE — Group Note (Signed)
 Physical/Occupational Therapy Group Note  Group Topic: Pain Management and Coping   Group Date: 12/20/2024 Start Time: 1300 End Time: 1345 Facilitators: Clive Warren CROME, OT   Group Description: Group discussed impact of chronic/acute pain on safety and independence with functional tasks and impact on mental health.  Identified and discussed any previously learned or implemented strategies used.  Discussed and reviewed cognitive behavioral pain coping strategies to address/improve overall management of pain. Discussed relaxation, distraction techniques, cognitive restructuring, activity pacing/energy conservation, environment/home safety modifications, and role of sleep and sleep hygiene. Allowed time for questions and further discussion.  Therapeutic Goal(s):  Identify and discuss previously utilized pain coping strategies and implications of pain on function/well-being Identify and discuss implementing new cognitive behavioral pain coping strategies into daily routines Demonstrate understanding and performance of learned cognitive behavioral pain coping strategies  Individual Participation: Pt did not attend.  Participation Level: Did not attend   Participation Quality:   Behavior:   Speech/Thought Process:   Affect/Mood:   Insight:   Judgement:   Modes of Intervention:   Patient Response to Interventions:    Plan: Continue to engage patient in PT/OT groups 1 - 2x/week.   Davi Rotan R., MPH, MS, OTR/L ascom [phone] 12/20/24, 4:00 PM

## 2024-12-20 NOTE — BHH Counselor (Signed)
 CSW received return call from [place], [phone], with [facility].  She reports that the patient was served with an ex-parte order that allows her to make decisions for the patient.  She reports that previously patient had a petition for guardianship.    She reports that patient does not qualify for long term care medicaid due to her income.  She requested that CSW revise her FL2.   CSW will assist and submit to psychiatrist for signature.    FL2 to be sent to [email or web address].   Sherryle Margo, MSW, LCSW 12/20/2024 1:37 PM

## 2024-12-20 NOTE — Progress Notes (Signed)
 Patient pleasant during interaction. Observed in dayroom during group and with meals. One episode of tearfulness around documents served by sheriff but patient able to make needs known and no further issues during the shift. Denies SI/HI/AVH. Accepted medications without issue. Ongoing monitoring continues.  12/20/24 1100  Psych Admission Type (Psych Patients Only)  Admission Status Involuntary  Psychosocial Assessment  Patient Complaints None  Eye Contact Fair  Facial Expression Flat  Affect Appropriate to circumstance  Speech Soft  Interaction Minimal  Motor Activity Other (Comment) (Steady)  Appearance/Hygiene Unremarkable  Behavior Characteristics Cooperative  Mood Pleasant  Thought Process  Coherency WDL  Content WDL  Delusions None reported or observed  Perception WDL  Hallucination None reported or observed  Judgment Impaired  Confusion None  Danger to Self  Current suicidal ideation? Denies  Danger to Others  Danger to Others None reported or observed

## 2024-12-20 NOTE — Group Note (Signed)
 Date:  12/20/2024 Time:  10:03 AM  Group Topic/Focus:   Goals Group:   The focus of this group is to help patients establish daily goals to achieve during treatment and discuss how the patient can incorporate goal setting into their daily lives to aide in recovery.    Participation Level:  Did Not Attend  Maglione,Blossie Raffel E 12/20/2024, 10:03 AM

## 2024-12-20 NOTE — Progress Notes (Signed)
 [facility] MD Progress Note   5:46 PM Shari [name]  MRN:  [id number] Patient is a 69 year old female with a history of Schizoaffective Disorder, Bipolar Type who presents via [employer] under IVC, initiated by CM, Falencio with re-entry program, to [facility] for assessment. Per IVC, [name] has been diagnosed with Schizophrenia and is refusing to take medication. [name] isn't sleeping. Residents report her screaming and yelling all night long.  [name] responding to internal stimuli.  [name] verbally aggressive.  The respondent has locked all of the residents inside of the transitional housing home.  They could not leave the home which prompted the IVC.  Residents did not have access to food, the restroom or anything as a result of being locked in.  [name] is currently under Adult protective services care. Patient is admitted to [facility] unit with Q15 min safety monitoring. Multidisciplinary team approach is offered. Medication management; group/milieu therapy is offered.   Subjective:  Chart reviewed, case discussed in multidisciplinary meeting, patient seen during rounds.    Patient is noted to be resting in her room.  She reports being frustrated and show the documents of hearing that she got herself today.  The papers were about her guardianship scheduled for February 4 in the morning.  Patient reports that somebody is plotting against her.  She remains delusional about the house being her and not remembering when that the people moved into the house.  She reports having a key to her room.  She offers no other complaints and denies any physical complaints.  Patient denies SI/HI/plan   Psychiatric History: see h&P Family History:  Family History  Problem Relation Age of Onset   Diabetes Mother    CAD Mother    Lung cancer Father    Social History:  Social History   Substance and Sexual Activity  Alcohol Use Yes   Comment: beer occasionally     Social History    Substance and Sexual Activity  Drug Use No    Social History   Socioeconomic History   Marital status: Widowed    Spouse name: Not on file   Number of children: Not on file   Years of education: Not on file   Highest education level: Not on file  Occupational History   Not on file  Tobacco Use   Smoking status: Every Day    Current packs/day: 0.50    Average packs/day: 0.5 packs/day for 40.0 years (20.0 ttl pk-yrs)    Types: Cigarettes   Smokeless tobacco: Never  Vaping Use   Vaping status: Never Used  Substance and Sexual Activity   Alcohol use: Yes    Comment: beer occasionally   Drug use: No   Sexual activity: Not Currently    Birth control/protection: Post-menopausal  Other Topics Concern   Not on file  Social History Narrative   Not on file   Social Drivers of Health   Tobacco Use: High Risk (11/02/2024)   Patient History    Smoking Tobacco Use: Every Day    Smokeless Tobacco Use: Never    Passive Exposure: Not on file  Financial Resource Strain: Not on file  Food Insecurity: No Food Insecurity (10/17/2024)   Epic    Worried About Programme Researcher, Broadcasting/film/video in the Last Year: Never true    Ran Out of Food in the Last Year: Never true  Transportation Needs: No Transportation Needs (10/17/2024)   Epic    Lack of Transportation (  Medical): No    Lack of Transportation (Non-Medical): No  Recent Concern: Transportation Needs - Unmet Transportation Needs (10/14/2024)   Epic    Lack of Transportation (Medical): Yes    Lack of Transportation (Non-Medical): Yes  Physical Activity: Not on file  Stress: Not on file  Social Connections: Unknown (10/17/2024)   Social Connection and Isolation Panel    Frequency of Communication with Friends and Family: Patient unable to answer    Frequency of Social Gatherings with Friends and Family: Patient unable to answer    Attends Religious Services: Patient unable to answer    Active Member of Clubs or Organizations: Patient  declined    Attends Banker Meetings: Patient unable to answer    Marital Status: Widowed  Depression (PHQ2-9): Not on file  Alcohol Screen: Low Risk (10/17/2024)   Alcohol Screen    Last Alcohol Screening Score (AUDIT): 0  Housing: Low Risk (10/17/2024)   Epic    Unable to Pay for Housing in the Last Year: No    Number of Times Moved in the Last Year: 0    Homeless in the Last Year: No  Utilities: Not At Risk (10/17/2024)   Epic    Threatened with loss of utilities: No  Health Literacy: Not on file   Past Medical History:  Past Medical History:  Diagnosis Date   Eye globe prosthesis    GERD (gastroesophageal reflux disease)    History of blood transfusion 1973   related to abscess burst in my stomach   Hyperlipemia    Hyperlipidemia    Hypertension    Osteoarthritis of back    Lowerback    SVD (spontaneous vaginal delivery)    x 1, baby died at 2 wks of age   Type II diabetes mellitus (HCC)     Past Surgical History:  Procedure Laterality Date   APPENDECTOMY     COLONOSCOPY     DILATATION & CURETTAGE/HYSTEROSCOPY WITH MYOSURE N/A 02/25/2015   Procedure: DILATATION & CURETTAGE/HYSTEROSCOPY WITH MYOSURE;  Surgeon: Truman Corona, MD;  Location: [facility];  Service: Gynecology;  Laterality: N/A;   DILATION AND CURETTAGE OF UTERUS     ENUCLEATION Right 03/16/2017   ENUCLEATION Right 03/16/2017   Procedure: ENUCLEATION RIGHT EYE;  Surgeon: Loyd Kathryne Palm, MD;  Location: [facility] OR;  Service: Ophthalmology;  Laterality: Right;   EYE SURGERY     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6, 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   EYE SURGERY Right ~ 1974   S/P initial eye injury; scissors stuck in my eye   LAPAROSCOPIC CHOLECYSTECTOMY     SHOULDER ARTHROSCOPY WITH ROTATOR CUFF REPAIR Left    wire stitches per patient   SHOULDER ARTHROSCOPY WITH ROTATOR CUFF REPAIR Right     Current Medications: Current Facility-Administered Medications  Medication Dose Route Frequency Provider Last Rate Last Admin    acetaminophen  (TYLENOL ) tablet 650 mg  650 mg Oral Q6H PRN Tex Drilling, NP   650 mg at 12/07/24 2126   alum & mag hydroxide-simeth (MAALOX/MYLANTA) 200-200-20 MG/5ML suspension 30 mL  30 mL Oral Q4H PRN Nkwenti, Drilling, NP       cloNIDine  (CATAPRES ) tablet 0.1 mg  0.1 mg Oral BID PRN Tex Drilling, NP       divalproex  (DEPAKOTE ) DR tablet 1,500 mg  1,500 mg Oral QHS Keyah Blizard, MD   1,500 mg at 12/07/24 2126   divalproex  (DEPAKOTE ) DR tablet 500 mg  500 mg Oral q AM Yameli Delamater, MD  500 mg at 12/08/24 9192   losartan  (COZAAR ) tablet 25 mg  25 mg Oral Daily Tex Drilling, NP   25 mg at 12/08/24 0928   magnesium  hydroxide (MILK OF MAGNESIA) suspension 30 mL  30 mL Oral Daily PRN Tex Drilling, NP       menthol  (CEPACOL) lozenge 3 mg  1 lozenge Oral PRN Bobbitt, Shalon E, NP   3 mg at 12/08/24 9065   methimazole  (TAPAZOLE ) tablet 10 mg  10 mg Oral Daily Nkwenti, Doris, NP   10 mg at 12/08/24 9071   OLANZapine  (ZYPREXA ) injection 5 mg  5 mg Intramuscular TID PRN Tex Drilling, NP       OLANZapine  (ZYPREXA ) injection 5 mg  5 mg Intramuscular TID PRN Tex Drilling, NP       OLANZapine  (ZYPREXA ) tablet 20 mg  20 mg Oral QHS Valisa Karpel, MD   20 mg at 12/07/24 2127   OLANZapine  zydis (ZYPREXA ) disintegrating tablet 5 mg  5 mg Oral TID PRN Tex Drilling, NP       OLANZapine  zydis (ZYPREXA ) disintegrating tablet 5 mg  5 mg Oral TID PRN Tex Drilling, NP       paliperidone  (INVEGA ) 24 hr tablet 3 mg  3 mg Oral Daily Shrivastava, Aryendra, MD   3 mg at 12/08/24 9071   pantoprazole  (PROTONIX ) EC tablet 40 mg  40 mg Oral Daily Nkwenti, Doris, NP   40 mg at 12/08/24 9071   traZODone  (DESYREL ) tablet 50 mg  50 mg Oral QHS PRN Tex Drilling, NP   50 mg at 12/06/24 2109    Lab Results:  No results found for this or any previous visit (from the past 48 hours).       Blood Alcohol level:  Lab Results  Component Value Date   [facility] <15 10/14/2024   ETH <10 07/02/2022    Metabolic  Disorder Labs: Lab Results  Component Value Date   HGBA1C 5.5 10/14/2024   MPG 111.15 10/14/2024   MPG 137 01/06/2022   Lab Results  Component Value Date   PROLACTIN 5.8 10/14/2024   Lab Results  Component Value Date   CHOL 167 10/14/2024   TRIG 68 10/14/2024   HDL 60 10/14/2024   CHOLHDL 2.8 10/14/2024   VLDL 14 10/14/2024   LDLCALC 93 10/14/2024   LDLCALC 73 03/13/2024    Physical Findings: AIMS:  , ,  ,  ,    CIWA:    COWS:      Psychiatric Specialty Exam:  Presentation  General Appearance:  Appropriate for Environment  Eye Contact: Fleeting  Speech: Normal Rate  Speech Volume: Decreased    Mood and Affect  Mood:fine  Affect: Flat   Thought Process  Thought Processes: impoverished  Orientation:Partial  Thought Content: Chronic delusions Hallucinations: Denies  Ideas of Reference:Delusions; Paranoia  Suicidal Thoughts: Denies  Homicidal Thoughts: Denies   Sensorium  Memory: Immediate Fair; Recent Fair  Judgment: Impaired  Insight: Shallow   Executive Functions  Concentration: Fair  Attention Span: Fair  Recall: Poor  Fund of Knowledge: Fair  Language: Fair   Psychomotor Activity  Psychomotor Activity: No data recorded  Musculoskeletal: Strength & Muscle Tone: within normal limits Gait & Station: normal Assets  Assets: Manufacturing Systems Engineer; Desire for Improvement; Social Support    Physical Exam: Physical Exam Vitals and nursing note reviewed.    ROS Blood pressure (!) 152/69, pulse 98, temperature 98.3 F (36.8 C), resp. rate 18, height 5' 8 (1.727 m), weight 48.3 kg,  SpO2 100%. Body mass index is 16.19 kg/m.  Diagnosis: Principal Problem:   Schizophrenia, paranoid (HCC) Major neurocognitive disorder-slums total score of 13/30   Treatment Plan Summary: APS referral has been made as patient lacks capacity to make medical decisions.  Recommending legal guardianship and further placement at  appropriate level of care  Safety and Monitoring:             -- Involuntary admission to inpatient psychiatric unit for safety, stabilization and treatment             -- Daily contact with patient to assess and evaluate symptoms and progress in treatment             -- Patient's case to be discussed in multi-disciplinary team meeting             -- Observation Level: q15 minute checks             -- Vital signs:  q12 hours             -- Precautions: suicide, elopement, and assault   2. Psychiatric Diagnoses and Treatment:  Check Depakote  levels 11/20/2024-60 Invega  at 3 mg increased to 6 mg           Depakote  500 mg every morning and 1500 mg   Zyprexa  20 mg nightly  -- The risks/benefits/side-effects/alternatives to this medication were discussed in detail with the patient and time was given for questions. The patient consents to medication trial.                -- Metabolic profile and EKG monitoring obtained while on an atypical antipsychotic (BMI: Lipid Panel: HbgA1c: QTc:)              -- Encouraged patient to participate in unit milieu and in scheduled group therapies   Occupational Therapy recommendations If plan is discharge home, recommend the following:   Direct supervision/assist for medications management;Direct supervision/assist for financial management;Assist for transportation;Assistance with cooking/housework    Ms Matson was seen for OT treatment on this date. Upon arrival to room pt in dayroom, agreeable to tx. The SLUMS is a 30-point screening questionnaire that tests orientation, memory, attention, problem solving, and executive function. Pt scored a 13/30 indicating Dementia. Of note, it is not within occupational therapy scope of practice to diagnose cognitive impairments, this screen indicates need for further testing. Pt with noted impairments in memory and problem solving limiting ability to participate functionally in medication management, bill management, and  safety cooking. Will continue to follow POC. Continue to recommend SUPERVISION for safety with iADLs.    [facility] Mental Status Examination Orientation: 2/3  Calculations: 0/3 Naming animals: 2/3 Patient named 14 animals (0 points is 0-4 animals; 1 is 5-9 animals; 2 is 10-14 animals; 3 is 15+ animals) Recall: 3/5  Attention: 1/2 Clock drawing: 1/4 Visual Processing: 2/2 Paragraph Memory: 2/8  Total: 13/30;  Given that patient has completed high school, this score falls in the dementia range.   4. Discharge Planning:   -- Social work and case management to assist with discharge planning and identification of hospital follow-up needs prior to discharge  -- Estimated LOS: 3-4 days  Millie JONELLE Manners, MD 12/08/2024, 5:46 PM

## 2024-12-20 NOTE — Group Note (Deleted)
 Date:  12/20/2024 Time:  8:41 PM  Group Topic/Focus:  Wrap-Up Group:   The focus of this group is to help patients review their daily goal of treatment and discuss progress on daily workbooks.     Participation Level:  {BHH PARTICIPATION OZCZO:77735}  Participation Quality:  {BHH PARTICIPATION QUALITY:22265}  Affect:  {BHH AFFECT:22266}  Cognitive:  {BHH COGNITIVE:22267}  Insight: {BHH Insight2:20797}  Engagement in Group:  {BHH ENGAGEMENT IN HMNLE:77731}  Modes of Intervention:  {BHH MODES OF INTERVENTION:22269}  Additional Comments:  ***  [name] [name] 12/20/2024, 8:41 PM

## 2024-12-20 NOTE — Progress Notes (Signed)
" °  Patient was cooperative and pleasant. Tolerated medications well. Denies SI/HI/AVH. Slept for 6 hours.   12/19/24 2100  Psych Admission Type (Psych Patients Only)  Admission Status Involuntary  Psychosocial Assessment  Patient Complaints None  Eye Contact Fair  Facial Expression Flat  Affect Appropriate to circumstance  Speech Soft  Interaction Minimal  Motor Activity Other (Comment) (WDL)  Appearance/Hygiene Unremarkable  Behavior Characteristics Cooperative  Mood Pleasant  Thought Process  Coherency WDL  Content WDL  Delusions None reported or observed  Perception WDL  Hallucination None reported or observed  Judgment Impaired  Confusion None  Danger to Self  Current suicidal ideation? Denies  Agreement Not to Harm Self Yes  Description of Agreement verbal  Danger to Others  Danger to Others None reported or observed    "

## 2024-12-20 NOTE — Plan of Care (Signed)
   Problem: Coping: Goal: Ability to verbalize frustrations and anger appropriately will improve Outcome: Progressing Goal: Ability to demonstrate self-control will improve Outcome: Progressing

## 2024-12-20 NOTE — Plan of Care (Signed)
  Problem: Education: Goal: Emotional status will improve Outcome: Progressing Goal: Mental status will improve Outcome: Progressing   Problem: Activity: Goal: Interest or engagement in activities will improve Outcome: Progressing Goal: Sleeping patterns will improve Outcome: Progressing   Problem: Coping: Goal: Ability to verbalize frustrations and anger appropriately will improve Outcome: Progressing   Problem: Safety: Goal: Periods of time without injury will increase Outcome: Progressing

## 2024-12-20 NOTE — Group Note (Signed)
 Date:  12/20/2024 Time:  8:44 PM  Group Topic/Focus:  Wrap-Up Group:   The focus of this group is to help patients review their daily goal of treatment and discuss progress on daily workbooks.    Participation Level:  Active  Participation Quality:  Appropriate  Affect:  Appropriate  Cognitive:  Appropriate  Insight: Appropriate  Engagement in Group:  Engaged  Modes of Intervention:  Discussion  Additional Comments:    Shari [name] 12/20/2024, 8:44 PM

## 2024-12-20 NOTE — BHH Counselor (Signed)
 CSW attempted to reach  [name] [phone]), with [facility] Department of Social Services.  CSW left HIPAA compliant voicemail requesting a return call.  Sherryle Margo, MSW, LCSW 12/20/2024 11:33 AM

## 2024-12-20 NOTE — [place] FL2 (Signed)
 " [place] [facility]  MEDICAID FL2 LEVEL OF CARE FORM     IDENTIFICATION  Patient Name: Shari [name] Birthdate: 19-Jul-1956 Sex: female Admission Date (Current Location): 10/17/2024  [place]:  [name] [id number] Facility and Address:  [facility], [address] [place], [place] [address]      Provider Number: [id number]  Attending Physician Name and Address:  Donnelly Mellow, MD  Relative Name and Phone Number:  N/A    Current Level of Care: Hospital Recommended Level of Care: Skilled Nursing Facility, Family Care Home, Assisted Living Facility Prior Approval Number:    Date Approved/Denied:   PASRR Number:    Discharge Plan:  (Skilled Set Designer, Assisted Living Facility, [facility])    Current Diagnoses: Patient Active Problem List   Diagnosis Date Noted   Schizophrenia, paranoid (HCC) 10/17/2024   Sinus tachycardia 03/15/2024   PAC (premature atrial contraction) 03/15/2024   Abnormal echocardiogram 03/15/2024   Hypokalemia 03/13/2024   Hypomagnesemia 03/13/2024   Chest pain 03/13/2024   Shortness of breath 03/13/2024   Fever 03/13/2024   Hyperlipidemia 03/13/2024   Schizoaffective disorder, bipolar type (HCC) 01/05/2022   Schizophrenia (HCC) 01/05/2022   Encounter for psychiatric assessment 11/21/2021   Paranoid schizophrenia (HCC) 10/20/2021   Mild cognitive impairment 10/18/2021   Gastroesophageal reflux disease 02/26/2021   Hypercholesterolemia 02/26/2021   Hypertension 02/26/2021   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   Graves disease 09/20/2019   Palpitations 05/29/2019   Hyperthyroidism 05/29/2019   Mixed dyslipidemia 05/29/2019   Paroxysmal atrial fibrillation (HCC) 05/29/2019   PMB (postmenopausal bleeding) 03/21/2018   Urinary retention 03/21/2018   Uterine enlargement 03/21/2018   Surgery, elective 03/16/2017    Orientation RESPIRATION BLADDER Height  & Weight     Self, Place  Normal Continent Weight: 106 lb 8 oz (48.3 kg) Height:  5' 8 (172.7 cm)  BEHAVIORAL SYMPTOMS/MOOD NEUROLOGICAL BOWEL NUTRITION STATUS   (NA)  (NA) Continent Diet (Regular)  AMBULATORY STATUS COMMUNICATION OF NEEDS Skin   Independent Verbally Normal                       Personal Care Assistance Level of Assistance   (NA)           Functional Limitations Info  Sight Sight Info: Impaired        SPECIAL CARE FACTORS FREQUENCY  Blood pressure Blood Pressure Frequency: blood pressure checked twice daily                  Contractures Contractures Info: Not present    Additional Factors Info  Psychotropic Code Status Info: Full Allergies Info: Aspirin, Chocolate, Cocoa, Penicillins, Sulfa Antibiotics Psychotropic Info: Invega , Zyprexa , depakote          Current Medications (12/20/2024):  This is the current hospital active medication list Current Facility-Administered Medications  Medication Dose Route Frequency Provider Last Rate Last Admin   acetaminophen  (TYLENOL ) tablet 650 mg  650 mg Oral Q6H PRN Tex Drilling, NP   650 mg at 12/17/24 2129   alum & mag hydroxide-simeth (MAALOX/MYLANTA) 200-200-20 MG/5ML suspension 30 mL  30 mL Oral Q4H PRN Nkwenti, Drilling, NP       cloNIDine  (CATAPRES ) tablet 0.1 mg  0.1 mg Oral BID PRN Tex Drilling, NP       diclofenac  Sodium (VOLTAREN ) 1 % topical gel 4 g  4 g Topical QID PRN Donnelly Mellow, MD       divalproex  (  DEPAKOTE ) DR tablet 1,500 mg  1,500 mg Oral QHS Jadapalle, Sree, MD   1,500 mg at 12/19/24 2052   divalproex  (DEPAKOTE ) DR tablet 500 mg  500 mg Oral q AM Jadapalle, Sree, MD   500 mg at 12/20/24 0758   famotidine  (PEPCID ) 40 MG/5ML suspension 40 mg  40 mg Oral Daily Jadapalle, Sree, MD   40 mg at 12/20/24 9073   lidocaine  (LIDODERM ) 5 % 1 patch  1 patch Transdermal Q24H Laurita Cort DASEN, MD   1 patch at 12/20/24 1229   losartan  (COZAAR ) tablet 25 mg  25 mg Oral Daily Nkwenti, Doris, NP    25 mg at 12/20/24 9073   magnesium  hydroxide (MILK OF MAGNESIA) suspension 30 mL  30 mL Oral Daily PRN Tex Drilling, NP       menthol  (CEPACOL) lozenge 3 mg  1 lozenge Oral PRN Bobbitt, Shalon E, NP   3 mg at 12/17/24 2130   methimazole  (TAPAZOLE ) tablet 10 mg  10 mg Oral Daily Nkwenti, Doris, NP   10 mg at 12/20/24 9073   OLANZapine  (ZYPREXA ) injection 5 mg  5 mg Intramuscular TID PRN Tex Drilling, NP       OLANZapine  (ZYPREXA ) injection 5 mg  5 mg Intramuscular TID PRN Tex Drilling, NP       OLANZapine  (ZYPREXA ) tablet 20 mg  20 mg Oral QHS Jadapalle, Sree, MD   20 mg at 12/19/24 2053   OLANZapine  zydis (ZYPREXA ) disintegrating tablet 5 mg  5 mg Oral TID PRN Tex Drilling, NP       OLANZapine  zydis (ZYPREXA ) disintegrating tablet 5 mg  5 mg Oral TID PRN Tex Drilling, NP       paliperidone  (INVEGA ) 24 hr tablet 3 mg  3 mg Oral Daily Shrivastava, Aryendra, MD   3 mg at 12/20/24 0926   pantoprazole  (PROTONIX ) EC tablet 40 mg  40 mg Oral BID AC Zhang, Ping T, MD   40 mg at 12/20/24 0758   sucralfate  (CARAFATE ) 1 GM/10ML suspension 1 g  1 g Oral TID WC & HS Laurita Cort T, MD   1 g at 12/20/24 1145   traZODone  (DESYREL ) tablet 50 mg  50 mg Oral QHS PRN Tex Drilling, NP   50 mg at 12/19/24 2053     Discharge Medications: Please see discharge summary for a list of discharge medications.  Relevant Imaging Results:  Relevant Lab Results:   Additional Information SSN# [id number]  Sherryle JINNY Margo, LCSW     "

## 2024-12-20 NOTE — Group Note (Signed)
 Recreation Therapy Group Note   Group Topic:Emotion Expression  Group Date: 12/20/2024 Start Time: 1400 End Time: 1445 Facilitators: Celestia Jeoffrey BRAVO, LRT, CTRS Location: Dayroom  Group Description: Painting a Peaceful Place. Patients and LRT discuss what it means to be at peace, what it feels like physically and mentally. Pts are given a canvas and watercolor paint to use and encouraged to draw their idea of a peaceful place. Pts and LRT discuss how they use this in their daily life post discharge. Pts are encouraged to take their canvas home with them as a reminder to find their peaceful place whenever they are feeling depressed, anxious, etc.    Goal Area(s) Addressed:  Patient will identify what it means to experience a peaceful emotion. Patient will identify a new coping skill.  Patient will express their emotions through art. Patients will increase communication by talking with LRT and peers while in group.   Affect/Mood: N/A   Participation Level: Did not attend    Clinical Observations/Individualized Feedback: Patient did not attend.  Plan: Continue to engage patient in RT group sessions 2-3x/week.   Jeoffrey BRAVO Celestia, LRT, CTRS 12/20/2024 4:34 PM

## 2024-12-21 DIAGNOSIS — F200 Paranoid schizophrenia: Secondary | ICD-10-CM | POA: Diagnosis not present

## 2024-12-21 DIAGNOSIS — F0390 Unspecified dementia without behavioral disturbance: Secondary | ICD-10-CM | POA: Diagnosis not present

## 2024-12-21 NOTE — Progress Notes (Signed)
" °   12/21/24 2200  Psych Admission Type (Psych Patients Only)  Admission Status Involuntary  Psychosocial Assessment  Patient Complaints None  Eye Contact Fair  Facial Expression Animated  Affect Appropriate to circumstance  Speech Logical/coherent  Interaction Minimal  Motor Activity Slow  Appearance/Hygiene Unremarkable  Behavior Characteristics Cooperative;Appropriate to situation  Mood Pleasant  Aggressive Behavior  Effect No apparent injury  Thought Process  Coherency WDL  Content WDL  Delusions None reported or observed  Perception WDL  Hallucination None reported or observed  Judgment Impaired  Confusion None  Danger to Self  Current suicidal ideation? Denies  Agreement Not to Harm Self Yes  Description of Agreement Verbal  Danger to Others  Danger to Others None reported or observed    "

## 2024-12-21 NOTE — Plan of Care (Signed)
   Problem: Education: Goal: Emotional status will improve Outcome: Progressing Goal: Mental status will improve Outcome: Progressing Goal: Verbalization of understanding the information provided will improve Outcome: Progressing   Problem: Activity: Goal: Interest or engagement in activities will improve Outcome: Progressing

## 2024-12-21 NOTE — Group Note (Signed)
 [facility] LCSW Group Therapy Note   Group Date: 12/21/2024 Start Time: 1405 End Time: 1435   Type of Therapy/Topic:  Group Therapy:  Balance in Life  Participation Level:  Active   Description of Group:    This group will address the concept of balance and how it feels and looks when one is unbalanced. Patients will be encouraged to process areas in their lives that are out of balance, and identify reasons for remaining unbalanced. Facilitators will guide patients utilizing problem- solving interventions to address and correct the stressor making their life unbalanced. Understanding and applying boundaries will be explored and addressed for obtaining  and maintaining a balanced life. Patients will be encouraged to explore ways to assertively make their unbalanced needs known to significant others in their lives, using other group members and facilitator for support and feedback.  Therapeutic Goals: Patient will identify two or more emotions or situations they have that consume much of in their lives. Patient will identify signs/triggers that life has become out of balance:  Patient will identify two ways to set boundaries in order to achieve balance in their lives:  Patient will demonstrate ability to communicate their needs through discussion and/or role plays  Summary of Patient Progress: The facilitator and patients discussed 7 steps to finding balance in life. Group members chose 3 affirmation stickers that they focused on to bring positivity to every situation or challenge.  The patient reflected on their own personal thoughts and feelings about themselves and explained why they chose that particular affirmation.  The patient was receptive to feedback from both peers and the facilitator and contributed to creating a supportive environment, encouraging others to open up and share.   Therapeutic Modalities:   Cognitive Behavioral Therapy Solution-Focused Therapy Assertiveness  Training   [name], LCSWA

## 2024-12-21 NOTE — Group Note (Signed)
 Date:  12/21/2024 Time:  10:07 PM  Group Topic/Focus:  Self Care:   The focus of this group is to help patients understand the importance of self-care in order to improve or restore emotional, physical, spiritual, interpersonal, and financial health.    Participation Level:  Active  Participation Quality:  Appropriate  Affect:  Appropriate  Cognitive:  Appropriate  Insight: Appropriate and Good  Engagement in Group:  Engaged  Modes of Intervention:  Discussion  Additional Comments:    Shari [name] 12/21/2024, 10:07 PM

## 2024-12-21 NOTE — Plan of Care (Signed)
  Problem: Education: Goal: Mental status will improve Outcome: Progressing   Problem: Activity: Goal: Interest or engagement in activities will improve Outcome: Progressing Goal: Sleeping patterns will improve Outcome: Progressing

## 2024-12-21 NOTE — Group Note (Signed)
 Date:  12/21/2024 Time:  4:55 PM  Group Topic/Focus:  Wellness Toolbox:   The focus of this group is to discuss various aspects of wellness, balancing those aspects and exploring ways to increase the ability to experience wellness.  Patients will create a wellness toolbox for use upon discharge.    Participation Level:  Minimal  Participation Quality:  Appropriate and Attentive  Affect:  Appropriate  Cognitive:  Alert  Insight: Appropriate  Engagement in Group:  Engaged and Limited  Modes of Intervention:  Activity and Discussion  Additional Comments:     Maglione,Wafaa Deemer E 12/21/2024, 4:55 PM

## 2024-12-21 NOTE — Group Note (Signed)
 Date:  12/21/2024 Time:  9:43 AM  Group Topic/Focus:  Coping With Mental Health Crisis:   The purpose of this group is to help patients identify strategies for coping with mental health crisis.  Group discusses possible causes of crisis and ways to manage them effectively.    Participation Level:  Did Not Attend    [name] 12/21/2024, 9:43 AM

## 2024-12-21 NOTE — Progress Notes (Signed)
 Behavior:  Pleasant and cooperative.  Present in the milieu.   Psych assessment: Denies SI/HI and AVH.    Group attendance:  2/3  Medication/ PRNs: Compliant.  Pain: Denies.  15 min checks in place for safety.

## 2024-12-21 NOTE — Progress Notes (Signed)
 Pt A&Ox3; disoriented to situation. She denies SI/HIAVH at this time. [name] seen attending group, having snack, and interacting appropriately on the milieu. She slept well throughout the night with PRN Trazodone  50 mg.   12/20/24 2103  Psych Admission Type (Psych Patients Only)  Admission Status Involuntary  Psychosocial Assessment  Patient Complaints None  Eye Contact Fair  Facial Expression Flat  Affect Appropriate to circumstance  Speech Soft  Interaction Minimal  Motor Activity Slow  Appearance/Hygiene Unremarkable  Behavior Characteristics Cooperative  Mood Pleasant  Thought Process  Coherency WDL  Content WDL  Delusions None reported or observed  Perception WDL  Hallucination None reported or observed  Judgment Impaired  Confusion None  Danger to Self  Current suicidal ideation? Denies  Danger to Others  Danger to Others None reported or observed

## 2024-12-21 NOTE — Progress Notes (Signed)
 [facility] MD Progress Note   5:46 PM Shari [name]  MRN:  [id number] Patient is a 69 year old female with a history of Schizoaffective Disorder, Bipolar Type who presents via [employer] under IVC, initiated by CM, Falencio with re-entry program, to [facility] for assessment. Per IVC, [name] has been diagnosed with Schizophrenia and is refusing to take medication. [name] isn't sleeping. Residents report her screaming and yelling all night long.  [name] responding to internal stimuli.  [name] verbally aggressive.  The respondent has locked all of the residents inside of the transitional housing home.  They could not leave the home which prompted the IVC.  Residents did not have access to food, the restroom or anything as a result of being locked in.  [name] is currently under Adult protective services care. Patient is admitted to [facility] unit with Q15 min safety monitoring. Multidisciplinary team approach is offered. Medication management; group/milieu therapy is offered.   Subjective:  Chart reviewed, case discussed in multidisciplinary meeting, patient seen during rounds.    Patient is noted to be resting in her room.  She reports being frustrated and show the documents of hearing that she got herself today.  The papers were about her guardianship scheduled for February 4 in the morning.  Patient reports that somebody is plotting against her.  She remains delusional about the house being her and not remembering when that the people moved into the house.  She reports having a key to her room.  She offers no other complaints and denies any physical complaints.  Patient denies SI/HI/plan   Psychiatric History: see h&P Family History:  Family History  Problem Relation Age of Onset   Diabetes Mother    CAD Mother    Lung cancer Father    Social History:  Social History   Substance and Sexual Activity  Alcohol Use Yes   Comment: beer occasionally     Social History    Substance and Sexual Activity  Drug Use No    Social History   Socioeconomic History   Marital status: Widowed    Spouse name: Not on file   Number of children: Not on file   Years of education: Not on file   Highest education level: Not on file  Occupational History   Not on file  Tobacco Use   Smoking status: Every Day    Current packs/day: 0.50    Average packs/day: 0.5 packs/day for 40.0 years (20.0 ttl pk-yrs)    Types: Cigarettes   Smokeless tobacco: Never  Vaping Use   Vaping status: Never Used  Substance and Sexual Activity   Alcohol use: Yes    Comment: beer occasionally   Drug use: No   Sexual activity: Not Currently    Birth control/protection: Post-menopausal  Other Topics Concern   Not on file  Social History Narrative   Not on file   Social Drivers of Health   Tobacco Use: High Risk (11/02/2024)   Patient History    Smoking Tobacco Use: Every Day    Smokeless Tobacco Use: Never    Passive Exposure: Not on file  Financial Resource Strain: Not on file  Food Insecurity: No Food Insecurity (10/17/2024)   Epic    Worried About Programme Researcher, Broadcasting/film/video in the Last Year: Never true    Ran Out of Food in the Last Year: Never true  Transportation Needs: No Transportation Needs (10/17/2024)   Epic    Lack of Transportation (  Medical): No    Lack of Transportation (Non-Medical): No  Recent Concern: Transportation Needs - Unmet Transportation Needs (10/14/2024)   Epic    Lack of Transportation (Medical): Yes    Lack of Transportation (Non-Medical): Yes  Physical Activity: Not on file  Stress: Not on file  Social Connections: Unknown (10/17/2024)   Social Connection and Isolation Panel    Frequency of Communication with Friends and Family: Patient unable to answer    Frequency of Social Gatherings with Friends and Family: Patient unable to answer    Attends Religious Services: Patient unable to answer    Active Member of Clubs or Organizations: Patient  declined    Attends Banker Meetings: Patient unable to answer    Marital Status: Widowed  Depression (PHQ2-9): Not on file  Alcohol Screen: Low Risk (10/17/2024)   Alcohol Screen    Last Alcohol Screening Score (AUDIT): 0  Housing: Low Risk (10/17/2024)   Epic    Unable to Pay for Housing in the Last Year: No    Number of Times Moved in the Last Year: 0    Homeless in the Last Year: No  Utilities: Not At Risk (10/17/2024)   Epic    Threatened with loss of utilities: No  Health Literacy: Not on file   Past Medical History:  Past Medical History:  Diagnosis Date   Eye globe prosthesis    GERD (gastroesophageal reflux disease)    History of blood transfusion 1973   related to abscess burst in my stomach   Hyperlipemia    Hyperlipidemia    Hypertension    Osteoarthritis of back    Lowerback    SVD (spontaneous vaginal delivery)    x 1, baby died at 2 wks of age   Type II diabetes mellitus (HCC)     Past Surgical History:  Procedure Laterality Date   APPENDECTOMY     COLONOSCOPY     DILATATION & CURETTAGE/HYSTEROSCOPY WITH MYOSURE N/A 02/25/2015   Procedure: DILATATION & CURETTAGE/HYSTEROSCOPY WITH MYOSURE;  Surgeon: Truman Corona, MD;  Location: [facility] OR[facility];  Service: Gynecology;  Laterality: N/A;   DILATION AND CURETTAGE OF UTERUS     ENUCLEATION Right 03/16/2017   ENUCLEATION Right 03/16/2017   Procedure: ENUCLEATION RIGHT EYE;  Surgeon: Loyd Kathryne Palm, MD;  Location: [facility] OR;  Service: Ophthalmology;  Laterality: Right;   EYE SURGERY     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6, 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   EYE SURGERY Right ~ 1974   S/P initial eye injury; scissors stuck in my eye   LAPAROSCOPIC CHOLECYSTECTOMY     SHOULDER ARTHROSCOPY WITH ROTATOR CUFF REPAIR Left    wire stitches per patient   SHOULDER ARTHROSCOPY WITH ROTATOR CUFF REPAIR Right     Current Medications: Current Facility-Administered Medications  Medication Dose Route Frequency Provider Last Rate Last Admin    acetaminophen  (TYLENOL ) tablet 650 mg  650 mg Oral Q6H PRN Tex Drilling, NP   650 mg at 12/07/24 2126   alum & mag hydroxide-simeth (MAALOX/MYLANTA) 200-200-20 MG/5ML suspension 30 mL  30 mL Oral Q4H PRN Nkwenti, Drilling, NP       cloNIDine  (CATAPRES ) tablet 0.1 mg  0.1 mg Oral BID PRN Tex Drilling, NP       divalproex  (DEPAKOTE ) DR tablet 1,500 mg  1,500 mg Oral QHS Jadapalle, Sree, MD   1,500 mg at 12/07/24 2126   divalproex  (DEPAKOTE ) DR tablet 500 mg  500 mg Oral q AM Jadapalle, Sree, MD  500 mg at 12/08/24 9192   losartan  (COZAAR ) tablet 25 mg  25 mg Oral Daily Tex Drilling, NP   25 mg at 12/08/24 0928   magnesium  hydroxide (MILK OF MAGNESIA) suspension 30 mL  30 mL Oral Daily PRN Tex Drilling, NP       menthol  (CEPACOL) lozenge 3 mg  1 lozenge Oral PRN Bobbitt, Shalon E, NP   3 mg at 12/08/24 9065   methimazole  (TAPAZOLE ) tablet 10 mg  10 mg Oral Daily Nkwenti, Doris, NP   10 mg at 12/08/24 9071   OLANZapine  (ZYPREXA ) injection 5 mg  5 mg Intramuscular TID PRN Tex Drilling, NP       OLANZapine  (ZYPREXA ) injection 5 mg  5 mg Intramuscular TID PRN Tex Drilling, NP       OLANZapine  (ZYPREXA ) tablet 20 mg  20 mg Oral QHS Jadapalle, Sree, MD   20 mg at 12/07/24 2127   OLANZapine  zydis (ZYPREXA ) disintegrating tablet 5 mg  5 mg Oral TID PRN Tex Drilling, NP       OLANZapine  zydis (ZYPREXA ) disintegrating tablet 5 mg  5 mg Oral TID PRN Tex Drilling, NP       paliperidone  (INVEGA ) 24 hr tablet 3 mg  3 mg Oral Daily Shrivastava, Aryendra, MD   3 mg at 12/08/24 9071   pantoprazole  (PROTONIX ) EC tablet 40 mg  40 mg Oral Daily Nkwenti, Doris, NP   40 mg at 12/08/24 9071   traZODone  (DESYREL ) tablet 50 mg  50 mg Oral QHS PRN Tex Drilling, NP   50 mg at 12/06/24 2109    Lab Results:  No results found for this or any previous visit (from the past 48 hours).       Blood Alcohol level:  Lab Results  Component Value Date   [facility] <15 10/14/2024   ETH <10 07/02/2022    Metabolic  Disorder Labs: Lab Results  Component Value Date   HGBA1C 5.5 10/14/2024   MPG 111.15 10/14/2024   MPG 137 01/06/2022   Lab Results  Component Value Date   PROLACTIN 5.8 10/14/2024   Lab Results  Component Value Date   CHOL 167 10/14/2024   TRIG 68 10/14/2024   HDL 60 10/14/2024   CHOLHDL 2.8 10/14/2024   VLDL 14 10/14/2024   LDLCALC 93 10/14/2024   LDLCALC 73 03/13/2024    Physical Findings: AIMS:  , ,  ,  ,    CIWA:    COWS:      Psychiatric Specialty Exam:  Presentation  General Appearance:  Appropriate for Environment  Eye Contact: Fleeting  Speech: Normal Rate  Speech Volume: Decreased    Mood and Affect  Mood:fine  Affect: Flat   Thought Process  Thought Processes: impoverished  Orientation:Partial  Thought Content: Chronic delusions Hallucinations: Denies  Ideas of Reference:Delusions; Paranoia  Suicidal Thoughts: Denies  Homicidal Thoughts: Denies   Sensorium  Memory: Immediate Fair; Recent Fair  Judgment: Impaired  Insight: Shallow   Executive Functions  Concentration: Fair  Attention Span: Fair  Recall: Poor  Fund of Knowledge: Fair  Language: Fair   Psychomotor Activity  Psychomotor Activity: No data recorded  Musculoskeletal: Strength & Muscle Tone: within normal limits Gait & Station: normal Assets  Assets: Manufacturing Systems Engineer; Desire for Improvement; Social Support    Physical Exam: Physical Exam Vitals and nursing note reviewed.    ROS Blood pressure (!) 152/69, pulse 98, temperature 98.3 F (36.8 C), resp. rate 18, height 5' 8 (1.727 m), weight 48.3 kg,  SpO2 100%. Body mass index is 16.19 kg/m.  Diagnosis: Principal Problem:   Schizophrenia, paranoid (HCC) Major neurocognitive disorder-slums total score of 13/30   Treatment Plan Summary: APS referral has been made as patient lacks capacity to make medical decisions.  Recommending legal guardianship and further placement at  appropriate level of care  Safety and Monitoring:             -- Involuntary admission to inpatient psychiatric unit for safety, stabilization and treatment             -- Daily contact with patient to assess and evaluate symptoms and progress in treatment             -- Patient's case to be discussed in multi-disciplinary team meeting             -- Observation Level: q15 minute checks             -- Vital signs:  q12 hours             -- Precautions: suicide, elopement, and assault   2. Psychiatric Diagnoses and Treatment:  Check Depakote  levels 11/20/2024-60 Invega  at 3 mg increased to 6 mg           Depakote  500 mg every morning and 1500 mg   Zyprexa  20 mg nightly  -- The risks/benefits/side-effects/alternatives to this medication were discussed in detail with the patient and time was given for questions. The patient consents to medication trial.                -- Metabolic profile and EKG monitoring obtained while on an atypical antipsychotic (BMI: Lipid Panel: HbgA1c: QTc:)              -- Encouraged patient to participate in unit milieu and in scheduled group therapies   Occupational Therapy recommendations If plan is discharge home, recommend the following:   Direct supervision/assist for medications management;Direct supervision/assist for financial management;Assist for transportation;Assistance with cooking/housework    Shari [name] was seen for OT treatment on this date. Upon arrival to room pt in dayroom, agreeable to tx. The SLUMS is a 30-point screening questionnaire that tests orientation, memory, attention, problem solving, and executive function. Pt scored a 13/30 indicating Dementia. Of note, it is not within occupational therapy scope of practice to diagnose cognitive impairments, this screen indicates need for further testing. Pt with noted impairments in memory and problem solving limiting ability to participate functionally in medication management, bill management, and  safety cooking. Will continue to follow POC. Continue to recommend SUPERVISION for safety with iADLs.    [facility] Mental Status Examination Orientation: 2/3  Calculations: 0/3 Naming animals: 2/3 Patient named 14 animals (0 points is 0-4 animals; 1 is 5-9 animals; 2 is 10-14 animals; 3 is 15+ animals) Recall: 3/5  Attention: 1/2 Clock drawing: 1/4 Visual Processing: 2/2 Paragraph Memory: 2/8  Total: 13/30;  Given that patient has completed high school, this score falls in the dementia range.   4. Discharge Planning:   -- Social work and case management to assist with discharge planning and identification of hospital follow-up needs prior to discharge  -- Estimated LOS: 3-4 days  Shari JONELLE Manners, MD 12/08/2024, 5:46 PM

## 2024-12-22 DIAGNOSIS — F200 Paranoid schizophrenia: Secondary | ICD-10-CM | POA: Diagnosis not present

## 2024-12-22 DIAGNOSIS — F0390 Unspecified dementia without behavioral disturbance: Secondary | ICD-10-CM | POA: Diagnosis not present

## 2024-12-22 NOTE — Progress Notes (Signed)
 [facility] MD Progress Note   5:46 PM Shari [name]  MRN:  [id number] Patient is a 69 year old female with a history of Schizoaffective Disorder, Bipolar Type who presents via [employer] under IVC, initiated by CM, Falencio with re-entry program, to [facility] for assessment. Per IVC, [name] has been diagnosed with Schizophrenia and is refusing to take medication. [name] isn't sleeping. Residents report her screaming and yelling all night long.  [name] responding to internal stimuli.  [name] verbally aggressive.  The respondent has locked all of the residents inside of the transitional housing home.  They could not leave the home which prompted the IVC.  Residents did not have access to food, the restroom or anything as a result of being locked in.  [name] is currently under Adult protective services care. Patient is admitted to [facility] unit with Q15 min safety monitoring. Multidisciplinary team approach is offered. Medication management; group/milieu therapy is offered.   Subjective:  Chart reviewed, case discussed in multidisciplinary meeting, patient seen during rounds.    The patient has been hospitalized for approximately 64-65 days. She reports ongoing improvement in mood and emotional stability. She continues to demonstrate good adherence to her prescribed medications without any reported adverse effects. Sleep and appetite remain adequate.  On observation, the patient is engaged in the milieu, calm, cooperative, and appropriately interactive. No behavioral disturbances or acute safety concerns were noted. She denies suicidal or homicidal ideation. There is no evidence of acute psychosis or significant mood instability.  The patient remains psychiatrically stable at this time. [name] awaiting placement, and continued inpatient care is medically necessary primarily due to disposition considerations rather than acute psychiatric decompensation.   Psychiatric History: see  h&P Family History:  Family History  Problem Relation Age of Onset   Diabetes Mother    CAD Mother    Lung cancer Father    Social History:  Social History   Substance and Sexual Activity  Alcohol Use Yes   Comment: beer occasionally     Social History   Substance and Sexual Activity  Drug Use No    Social History   Socioeconomic History   Marital status: Widowed    Spouse name: Not on file   Number of children: Not on file   Years of education: Not on file   Highest education level: Not on file  Occupational History   Not on file  Tobacco Use   Smoking status: Every Day    Current packs/day: 0.50    Average packs/day: 0.5 packs/day for 40.0 years (20.0 ttl pk-yrs)    Types: Cigarettes   Smokeless tobacco: Never  Vaping Use   Vaping status: Never Used  Substance and Sexual Activity   Alcohol use: Yes    Comment: beer occasionally   Drug use: No   Sexual activity: Not Currently    Birth control/protection: Post-menopausal  Other Topics Concern   Not on file  Social History Narrative   Not on file   Social Drivers of Health   Tobacco Use: High Risk (11/02/2024)   Patient History    Smoking Tobacco Use: Every Day    Smokeless Tobacco Use: Never    Passive Exposure: Not on file  Financial Resource Strain: Not on file  Food Insecurity: No Food Insecurity (10/17/2024)   Epic    Worried About Programme Researcher, Broadcasting/film/video in the Last Year: Never true    Ran Out of Food in the Last Year: Never  true  Transportation Needs: No Transportation Needs (10/17/2024)   Epic    Lack of Transportation (Medical): No    Lack of Transportation (Non-Medical): No  Recent Concern: Transportation Needs - Unmet Transportation Needs (10/14/2024)   Epic    Lack of Transportation (Medical): Yes    Lack of Transportation (Non-Medical): Yes  Physical Activity: Not on file  Stress: Not on file  Social Connections: Unknown (10/17/2024)   Social Connection and Isolation Panel    Frequency  of Communication with Friends and Family: Patient unable to answer    Frequency of Social Gatherings with Friends and Family: Patient unable to answer    Attends Religious Services: Patient unable to answer    Active Member of Clubs or Organizations: Patient declined    Attends Banker Meetings: Patient unable to answer    Marital Status: Widowed  Depression (PHQ2-9): Not on file  Alcohol Screen: Low Risk (10/17/2024)   Alcohol Screen    Last Alcohol Screening Score (AUDIT): 0  Housing: Low Risk (10/17/2024)   Epic    Unable to Pay for Housing in the Last Year: No    Number of Times Moved in the Last Year: 0    Homeless in the Last Year: No  Utilities: Not At Risk (10/17/2024)   Epic    Threatened with loss of utilities: No  Health Literacy: Not on file   Past Medical History:  Past Medical History:  Diagnosis Date   Eye globe prosthesis    GERD (gastroesophageal reflux disease)    History of blood transfusion 1973   related to abscess burst in my stomach   Hyperlipemia    Hyperlipidemia    Hypertension    Osteoarthritis of back    Lowerback    SVD (spontaneous vaginal delivery)    x 1, baby died at 2 wks of age   Type II diabetes mellitus (HCC)     Past Surgical History:  Procedure Laterality Date   APPENDECTOMY     COLONOSCOPY     DILATATION & CURETTAGE/HYSTEROSCOPY WITH MYOSURE N/A 02/25/2015   Procedure: DILATATION & CURETTAGE/HYSTEROSCOPY WITH MYOSURE;  Surgeon: Truman Corona, MD;  Location: [facility];  Service: Gynecology;  Laterality: N/A;   DILATION AND CURETTAGE OF UTERUS     ENUCLEATION Right 03/16/2017   ENUCLEATION Right 03/16/2017   Procedure: ENUCLEATION RIGHT EYE;  Surgeon: Loyd Kathryne Palm, MD;  Location: [facility] OR;  Service: Ophthalmology;  Laterality: Right;   EYE SURGERY     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6, 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   EYE SURGERY Right ~ 1974   S/P initial eye injury; scissors stuck in my eye   LAPAROSCOPIC CHOLECYSTECTOMY     SHOULDER  ARTHROSCOPY WITH ROTATOR CUFF REPAIR Left    wire stitches per patient   SHOULDER ARTHROSCOPY WITH ROTATOR CUFF REPAIR Right     Current Medications: Current Facility-Administered Medications  Medication Dose Route Frequency Provider Last Rate Last Admin   acetaminophen  (TYLENOL ) tablet 650 mg  650 mg Oral Q6H PRN Tex Drilling, NP   650 mg at 12/07/24 2126   alum & mag hydroxide-simeth (MAALOX/MYLANTA) 200-200-20 MG/5ML suspension 30 mL  30 mL Oral Q4H PRN Tex Drilling, NP       cloNIDine  (CATAPRES ) tablet 0.1 mg  0.1 mg Oral BID PRN Tex Drilling, NP       divalproex  (DEPAKOTE ) DR tablet 1,500 mg  1,500 mg Oral QHS Jadapalle, Sree, MD   1,500 mg at 12/07/24 2126  divalproex  (DEPAKOTE ) DR tablet 500 mg  500 mg Oral q AM Jadapalle, Sree, MD   500 mg at 12/08/24 9192   losartan  (COZAAR ) tablet 25 mg  25 mg Oral Daily Nkwenti, Doris, NP   25 mg at 12/08/24 9071   magnesium  hydroxide (MILK OF MAGNESIA) suspension 30 mL  30 mL Oral Daily PRN Tex Drilling, NP       menthol  (CEPACOL) lozenge 3 mg  1 lozenge Oral PRN Bobbitt, Shalon E, NP   3 mg at 12/08/24 9065   methimazole  (TAPAZOLE ) tablet 10 mg  10 mg Oral Daily Nkwenti, Doris, NP   10 mg at 12/08/24 9071   OLANZapine  (ZYPREXA ) injection 5 mg  5 mg Intramuscular TID PRN Tex Drilling, NP       OLANZapine  (ZYPREXA ) injection 5 mg  5 mg Intramuscular TID PRN Tex Drilling, NP       OLANZapine  (ZYPREXA ) tablet 20 mg  20 mg Oral QHS Jadapalle, Sree, MD   20 mg at 12/07/24 2127   OLANZapine  zydis (ZYPREXA ) disintegrating tablet 5 mg  5 mg Oral TID PRN Tex Drilling, NP       OLANZapine  zydis (ZYPREXA ) disintegrating tablet 5 mg  5 mg Oral TID PRN Tex Drilling, NP       paliperidone  (INVEGA ) 24 hr tablet 3 mg  3 mg Oral Daily Shrivastava, Aryendra, MD   3 mg at 12/08/24 9071   pantoprazole  (PROTONIX ) EC tablet 40 mg  40 mg Oral Daily Nkwenti, Doris, NP   40 mg at 12/08/24 9071   traZODone  (DESYREL ) tablet 50 mg  50 mg Oral QHS PRN  Tex Drilling, NP   50 mg at 12/06/24 2109    Lab Results:  No results found for this or any previous visit (from the past 48 hours).       Blood Alcohol level:  Lab Results  Component Value Date   [facility] <15 10/14/2024   ETH <10 07/02/2022    Metabolic Disorder Labs: Lab Results  Component Value Date   HGBA1C 5.5 10/14/2024   MPG 111.15 10/14/2024   MPG 137 01/06/2022   Lab Results  Component Value Date   PROLACTIN 5.8 10/14/2024   Lab Results  Component Value Date   CHOL 167 10/14/2024   TRIG 68 10/14/2024   HDL 60 10/14/2024   CHOLHDL 2.8 10/14/2024   VLDL 14 10/14/2024   LDLCALC 93 10/14/2024   LDLCALC 73 03/13/2024    Physical Findings: AIMS:  , ,  ,  ,    CIWA:    COWS:      Psychiatric Specialty Exam:  Presentation  General Appearance:  Appropriate for Environment  Eye Contact: Fleeting  Speech: Normal Rate  Speech Volume: Decreased    Mood and Affect  Mood:fine  Affect: Flat   Thought Process  Thought Processes: impoverished  Orientation:Partial  Thought Content: Chronic delusions Hallucinations: Denies  Ideas of Reference:Delusions; Paranoia  Suicidal Thoughts: Denies  Homicidal Thoughts: Denies   Sensorium  Memory: Immediate Fair; Recent Fair  Judgment: Impaired  Insight: Shallow   Executive Functions  Concentration: Fair  Attention Span: Fair  Recall: Poor  Fund of Knowledge: Fair  Language: Fair   Psychomotor Activity  Psychomotor Activity: No data recorded  Musculoskeletal: Strength & Muscle Tone: within normal limits Gait & Station: normal Assets  Assets: Manufacturing Systems Engineer; Desire for Improvement; Social Support    Physical Exam: Physical Exam Vitals and nursing note reviewed.    ROS Blood pressure (!) 152/69, pulse  98, temperature 98.3 F (36.8 C), resp. rate 18, height 5' 8 (1.727 m), weight 48.3 kg, SpO2 100%. Body mass index is 16.19 kg/m.  Diagnosis: Principal  Problem:   Schizophrenia, paranoid (HCC) Major neurocognitive disorder-slums total score of 13/30   Treatment Plan Summary: APS referral has been made as patient lacks capacity to make medical decisions.  Recommending legal guardianship and further placement at appropriate level of care  Safety and Monitoring:             -- Involuntary admission to inpatient psychiatric unit for safety, stabilization and treatment             -- Daily contact with patient to assess and evaluate symptoms and progress in treatment             -- Patient's case to be discussed in multi-disciplinary team meeting             -- Observation Level: q15 minute checks             -- Vital signs:  q12 hours             -- Precautions: suicide, elopement, and assault   2. Psychiatric Diagnoses and Treatment:  Check Depakote  levels 11/20/2024-60 Invega  at 3 mg increased to 6 mg           Depakote  500 mg every morning and 1500 mg   Zyprexa  20 mg nightly  -- The risks/benefits/side-effects/alternatives to this medication were discussed in detail with the patient and time was given for questions. The patient consents to medication trial.                -- Metabolic profile and EKG monitoring obtained while on an atypical antipsychotic (BMI: Lipid Panel: HbgA1c: QTc:)              -- Encouraged patient to participate in unit milieu and in scheduled group therapies   Occupational Therapy recommendations If plan is discharge home, recommend the following:   Direct supervision/assist for medications management;Direct supervision/assist for financial management;Assist for transportation;Assistance with cooking/housework    Ms Sarnowski was seen for OT treatment on this date. Upon arrival to room pt in dayroom, agreeable to tx. The SLUMS is a 30-point screening questionnaire that tests orientation, memory, attention, problem solving, and executive function. Pt scored a 13/30 indicating Dementia. Of note, it is not within  occupational therapy scope of practice to diagnose cognitive impairments, this screen indicates need for further testing. Pt with noted impairments in memory and problem solving limiting ability to participate functionally in medication management, bill management, and safety cooking. Will continue to follow POC. Continue to recommend SUPERVISION for safety with iADLs.    [facility] Mental Status Examination Orientation: 2/3  Calculations: 0/3 Naming animals: 2/3 Patient named 14 animals (0 points is 0-4 animals; 1 is 5-9 animals; 2 is 10-14 animals; 3 is 15+ animals) Recall: 3/5  Attention: 1/2 Clock drawing: 1/4 Visual Processing: 2/2 Paragraph Memory: 2/8  Total: 13/30;  Given that patient has completed high school, this score falls in the dementia range.   4. Discharge Planning:   -- Social work and case management to assist with discharge planning and identification of hospital follow-up needs prior to discharge  -- Estimated LOS: 3-4 days  Millie JONELLE Manners, MD 12/08/2024, 5:46 PM

## 2024-12-22 NOTE — Plan of Care (Signed)
   Problem: Education: Goal: Knowledge of Shari [name] General Education information/materials will improve Outcome: Progressing Goal: Emotional status will improve Outcome: Progressing Goal: Mental status will improve Outcome: Progressing Goal: Verbalization of understanding the information provided will improve Outcome: Progressing   Problem: Activity: Goal: Interest or engagement in activities will improve Outcome: Progressing Goal: Sleeping patterns will improve Outcome: Progressing   Problem: Coping: Goal: Ability to verbalize frustrations and anger appropriately will improve Outcome: Progressing Goal: Ability to demonstrate self-control will improve Outcome: Progressing

## 2024-12-22 NOTE — Group Note (Signed)
 Date:  12/22/2024 Time:  10:31 AM  Group Topic/Focus:  Rediscovering Joy:   The focus of this group is to explore various ways to relieve stress in a positive manner.    Participation Level:  Did Not Attend   Maglione,Ayelen Sciortino E 12/22/2024, 10:31 AM

## 2024-12-22 NOTE — Plan of Care (Signed)
   Problem: Education: Goal: Emotional status will improve Outcome: Progressing Goal: Mental status will improve Outcome: Progressing   Problem: Activity: Goal: Interest or engagement in activities will improve Outcome: Progressing

## 2024-12-22 NOTE — Group Note (Signed)
 Date:  12/22/2024 Time:  3:44 PM  Group Topic/Focus:  Self Care:   The focus of this group is to help patients understand the importance of self-care in order to improve or restore emotional, physical, spiritual, interpersonal, and financial health.    Participation Level:  Did Not Attend   Larrie Leita BRAVO 12/22/2024, 3:44 PM

## 2024-12-22 NOTE — Progress Notes (Signed)
" °   12/22/24 2200  Psych Admission Type (Psych Patients Only)  Admission Status Voluntary  Psychosocial Assessment  Patient Complaints None  Eye Contact Fair  Facial Expression Other (Comment) (wdl)  Affect Appropriate to circumstance  Speech Logical/coherent  Interaction Minimal  Motor Activity Slow  Appearance/Hygiene Unremarkable  Behavior Characteristics Cooperative;Appropriate to situation  Mood Pleasant  Aggressive Behavior  Effect No apparent injury  Thought Process  Coherency WDL  Content WDL  Delusions None reported or observed  Perception WDL  Hallucination None reported or observed  Judgment WDL  Confusion None  Danger to Self  Current suicidal ideation? Denies  Agreement Not to Harm Self Yes  Description of Agreement Verbal  Danger to Others  Danger to Others None reported or observed    "

## 2024-12-22 NOTE — Group Note (Signed)
 Date:  12/22/2024 Time:  8:53 PM  Group Topic/Focus:  Wrap-Up Group:   The focus of this group is to help patients review their daily goal of treatment and discuss progress on daily workbooks.  Coping Skills   Participation Level:  Active  Participation Quality:  Appropriate  Affect:  Appropriate  Cognitive:  Appropriate  Insight: Good  Engagement in Group:  Engaged  Modes of Intervention:  Discussion  Additional Comments:    Gwendlyn LITTIE Sharps 12/22/2024, 8:53 PM

## 2024-12-22 NOTE — Progress Notes (Signed)
" °   12/22/24 0830  Psych Admission Type (Psych Patients Only)  Admission Status Voluntary  Psychosocial Assessment  Patient Complaints None  Eye Contact Fair  Facial Expression Other (Comment) (wdl)  Affect Appropriate to circumstance  Speech Logical/coherent  Interaction Minimal  Motor Activity Slow  Appearance/Hygiene Unremarkable;In scrubs  Behavior Characteristics Cooperative;Appropriate to situation;Calm  Mood Pleasant  Thought Process  Coherency WDL  Content WDL  Delusions None reported or observed  Perception WDL  Hallucination None reported or observed  Judgment WDL  Confusion None  Danger to Self  Current suicidal ideation? Denies  Agreement Not to Harm Self Yes  Description of Agreement verbal  Danger to Others  Danger to Others None reported or observed   D- Patient alert and oriented x 3. Pt presents with pleasant mood and affect. Denies SI, HI, AVH, and pain. Pt goal is to be discharge. No ETA at this time.   A- Scheduled medications administered to patient, per MD orders. Support and encouragement provided.  Routine safety checks conducted every 15 minutes.  Patient informed to notify staff with problems or concerns.  R- No adverse drug reactions noted. Patient contracts for safety at this time. Patient compliant with medications and treatment plan. Patient receptive, calm, and cooperative. Patient interacts well with others on the unit.  Patient remains safe at this time.    Litzy Dicker S.,RN "

## 2024-12-23 DIAGNOSIS — F0390 Unspecified dementia without behavioral disturbance: Secondary | ICD-10-CM | POA: Diagnosis not present

## 2024-12-23 DIAGNOSIS — F200 Paranoid schizophrenia: Secondary | ICD-10-CM | POA: Diagnosis not present

## 2024-12-23 NOTE — Progress Notes (Signed)
 SPIRITUAL CARE AND COUNSELING CONSULT NOTE   VISIT SUMMARY Chaplain provided spiritual/emotional support to [place] while rounding on unit. Chaplain consulted with nurse team.   SPIRITUAL ENCOUNTER                                                                                                                                                                      Type of Visit: Initial Care provided to:: Patient Conversation partners present during encounter: Other (comment) (patients) Referral source: Chaplain assessment Reason for visit: Routine spiritual support OnCall Visit: No   SPIRITUAL FRAMEWORK  Presenting Themes: Goals in life/care, Impactful experiences and emotions, Coping tools Community/Connection: Family Patient Stress Factors: Health changes Family Stress Factors: None identified   GOALS   Self/Personal Goals: healing Clinical Care Goals: healing   INTERVENTIONS   Spiritual Care Interventions Made: Compassionate presence, Reflective listening    INTERVENTION OUTCOMES   Outcomes: Awareness around self/spiritual resourses, Awareness of support  Chaplain provided compassionate presence, reflective listening to elicit Shari [name]'s feelings about health status, treatment plan, family support, faith background, and desired outcomes.   SPIRITUAL CARE PLAN   Spiritual Care Issues Still Outstanding: Chaplain will continue to follow    If immediate needs arise, please contact [facility] 24 hour on call [phone]   Barabara Chess, Chaplain  12/23/2024 4:41 PM

## 2024-12-23 NOTE — Plan of Care (Signed)
   Problem: Education: Goal: Emotional status will improve Outcome: Progressing Goal: Mental status will improve Outcome: Progressing

## 2024-12-23 NOTE — Group Note (Signed)
 Physical/Occupational Therapy Group Note  Group Topic: Yoga  Group Date: 12/23/2024 Start Time: 1300 End Time: 1325 Facilitators: [place]ha Behlke, Alm Hamilton, PT   Group Description: Group participated with series of yoga poses, designed to emphasize functional sitting balance, core stability, generalized flexibility and overall posture.  Incorporated deep breathing techniques with poses, working to promote relaxation, mindfulness and focus with targeted activities.  Discussed benefits of yoga in improving mood and self-esteem, reducing stress and anxiety, and promoting functional strength, balance and core stability for each participant.  Discussed ways to integrate into each participants daily routine.  Provided handout with written and pictorial descriptions of included yoga movements to be utilized as appropriate outside of group time.  Therapeutic Goal(s):  Demonstrate safe ability to participate with yoga poses during group activity. Identify one benefit of participation with yoga poses as part of each participants exercise/movement routine. Identify 1-2 individual poses that participant feels most beneficial to his/her needs and that he/she can easily replicate outside of group.  Individual Participation: Pt was quietly observant during the session with occasional participation during the activity portion of the session. Pt was able to modify activities with min cuing as needed for patient comfort.   Participation Level: Moderate   Participation Quality: Minimal Cues   Behavior: Appropriate   Speech/Thought Process: Very little verbal communication during the session    Affect/Mood: Flat   Insight: Moderate   Judgement: Moderate   Modes of Intervention: Activity, Discussion, and Education  Patient Response to Interventions:  Attentive   Plan: Continue to engage patient in PT/OT groups 1 - 2x/week.  CHARM Hamilton Bertin PT, DPT 12/23/24, 2:06 PM

## 2024-12-23 NOTE — Group Note (Signed)
 Date:  12/23/2024 Time:  11:09 AM  Group Topic/Focus:  Personal Choices and Values:   The focus of this group is to help patients assess and explore the importance of values in their lives, how their values affect their decisions, how they express their values and what opposes their expression.    Participation Level:  Did Not Attend   [name] 12/23/2024, 11:09 AM

## 2024-12-23 NOTE — Progress Notes (Signed)
 Patient pleasant and responsive during interaction. Denies SI/HI/AVH. Patient accepted medications without issue. Denies pain. Ongoing monitoring continues.   12/23/24 1000  Psych Admission Type (Psych Patients Only)  Admission Status Voluntary  Psychosocial Assessment  Patient Complaints None  Eye Contact Fair  Facial Expression Animated  Affect Appropriate to circumstance  Speech Logical/coherent  Interaction Minimal  Motor Activity Slow  Appearance/Hygiene Unremarkable  Behavior Characteristics Cooperative  Mood Pleasant  Thought Process  Coherency WDL  Content WDL  Delusions None reported or observed  Perception WDL  Hallucination None reported or observed  Judgment WDL  Confusion None  Danger to Self  Current suicidal ideation? Denies  Agreement Not to Harm Self Yes  Description of Agreement verbal  Danger to Others  Danger to Others None reported or observed

## 2024-12-23 NOTE — Progress Notes (Signed)
 [facility] MD Progress Note   5:46 PM Shari [name]  MRN:  [id number] Patient is a 69 year old female with a history of Schizoaffective Disorder, Bipolar Type who presents via [employer] under IVC, initiated by CM, Falencio with re-entry program, to [facility] for assessment. Per IVC, [name] has been diagnosed with Schizophrenia and is refusing to take medication. [name] isn't sleeping. Residents report her screaming and yelling all night long.  [name] responding to internal stimuli.  [name] verbally aggressive.  The respondent has locked all of the residents inside of the transitional housing home.  They could not leave the home which prompted the IVC.  Residents did not have access to food, the restroom or anything as a result of being locked in.  [name] is currently under Adult protective services care. Patient is admitted to [facility] unit with Q15 min safety monitoring. Multidisciplinary team approach is offered. Medication management; group/milieu therapy is offered.   Subjective:  Chart reviewed, case discussed in multidisciplinary meeting, patient seen during rounds.  Patient is noted to be resting in the bed.  She offers no complaints.  She denies SI/HI/plan and denies hallucinations.  She reports fair appetite and sleep.  She denies any chest pain today.  Per nursing no behavioral problems.  Patient continues to wait for placement   The patient remains psychiatrically stable at this time. [name] awaiting placement, and continued inpatient care is medically necessary primarily due to disposition considerations rather than acute psychiatric decompensation.   Psychiatric History: see h&P Family History:  Family History  Problem Relation Age of Onset   Diabetes Mother    CAD Mother    Lung cancer Father    Social History:  Social History   Substance and Sexual Activity  Alcohol Use Yes   Comment: beer occasionally     Social History   Substance and Sexual  Activity  Drug Use No    Social History   Socioeconomic History   Marital status: Widowed    Spouse name: Not on file   Number of children: Not on file   Years of education: Not on file   Highest education level: Not on file  Occupational History   Not on file  Tobacco Use   Smoking status: Every Day    Current packs/day: 0.50    Average packs/day: 0.5 packs/day for 40.0 years (20.0 ttl pk-yrs)    Types: Cigarettes   Smokeless tobacco: Never  Vaping Use   Vaping status: Never Used  Substance and Sexual Activity   Alcohol use: Yes    Comment: beer occasionally   Drug use: No   Sexual activity: Not Currently    Birth control/protection: Post-menopausal  Other Topics Concern   Not on file  Social History Narrative   Not on file   Social Drivers of Health   Tobacco Use: High Risk (11/02/2024)   Patient History    Smoking Tobacco Use: Every Day    Smokeless Tobacco Use: Never    Passive Exposure: Not on file  Financial Resource Strain: Not on file  Food Insecurity: No Food Insecurity (10/17/2024)   Epic    Worried About Programme Researcher, Broadcasting/film/video in the Last Year: Never true    Ran Out of Food in the Last Year: Never true  Transportation Needs: No Transportation Needs (10/17/2024)   Epic    Lack of Transportation (Medical): No    Lack of Transportation (Non-Medical): No  Recent Concern: Transportation Needs -  Unmet Transportation Needs (10/14/2024)   Epic    Lack of Transportation (Medical): Yes    Lack of Transportation (Non-Medical): Yes  Physical Activity: Not on file  Stress: Not on file  Social Connections: Unknown (10/17/2024)   Social Connection and Isolation Panel    Frequency of Communication with Friends and Family: Patient unable to answer    Frequency of Social Gatherings with Friends and Family: Patient unable to answer    Attends Religious Services: Patient unable to answer    Active Member of Clubs or Organizations: Patient declined    Attends Tax Inspector Meetings: Patient unable to answer    Marital Status: Widowed  Depression (PHQ2-9): Not on file  Alcohol Screen: Low Risk (10/17/2024)   Alcohol Screen    Last Alcohol Screening Score (AUDIT): 0  Housing: Low Risk (10/17/2024)   Epic    Unable to Pay for Housing in the Last Year: No    Number of Times Moved in the Last Year: 0    Homeless in the Last Year: No  Utilities: Not At Risk (10/17/2024)   Epic    Threatened with loss of utilities: No  Health Literacy: Not on file   Past Medical History:  Past Medical History:  Diagnosis Date   Eye globe prosthesis    GERD (gastroesophageal reflux disease)    History of blood transfusion 1973   related to abscess burst in my stomach   Hyperlipemia    Hyperlipidemia    Hypertension    Osteoarthritis of back    Lowerback    SVD (spontaneous vaginal delivery)    x 1, baby died at 2 wks of age   Type II diabetes mellitus (HCC)     Past Surgical History:  Procedure Laterality Date   APPENDECTOMY     COLONOSCOPY     DILATATION & CURETTAGE/HYSTEROSCOPY WITH MYOSURE N/A 02/25/2015   Procedure: DILATATION & CURETTAGE/HYSTEROSCOPY WITH MYOSURE;  Surgeon: Truman Corona, MD;  Location: [facility];  Service: Gynecology;  Laterality: N/A;   DILATION AND CURETTAGE OF UTERUS     ENUCLEATION Right 03/16/2017   ENUCLEATION Right 03/16/2017   Procedure: ENUCLEATION RIGHT EYE;  Surgeon: Loyd Kathryne Palm, MD;  Location: [facility] OR;  Service: Ophthalmology;  Laterality: Right;   EYE SURGERY     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6, 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   EYE SURGERY Right ~ 1974   S/P initial eye injury; scissors stuck in my eye   LAPAROSCOPIC CHOLECYSTECTOMY     SHOULDER ARTHROSCOPY WITH ROTATOR CUFF REPAIR Left    wire stitches per patient   SHOULDER ARTHROSCOPY WITH ROTATOR CUFF REPAIR Right     Current Medications: Current Facility-Administered Medications  Medication Dose Route Frequency Provider Last Rate Last Admin   acetaminophen  (TYLENOL )  tablet 650 mg  650 mg Oral Q6H PRN Tex Drilling, NP   650 mg at 12/07/24 2126   alum & mag hydroxide-simeth (MAALOX/MYLANTA) 200-200-20 MG/5ML suspension 30 mL  30 mL Oral Q4H PRN Nkwenti, Drilling, NP       cloNIDine  (CATAPRES ) tablet 0.1 mg  0.1 mg Oral BID PRN Tex Drilling, NP       divalproex  (DEPAKOTE ) DR tablet 1,500 mg  1,500 mg Oral QHS Sydney Azure, MD   1,500 mg at 12/07/24 2126   divalproex  (DEPAKOTE ) DR tablet 500 mg  500 mg Oral q AM Wyatt Galvan, MD   500 mg at 12/08/24 0807   losartan  (COZAAR ) tablet 25 mg  25 mg  Oral Daily Tex Drilling, NP   25 mg at 12/08/24 9071   magnesium  hydroxide (MILK OF MAGNESIA) suspension 30 mL  30 mL Oral Daily PRN Tex Drilling, NP       menthol  (CEPACOL) lozenge 3 mg  1 lozenge Oral PRN Bobbitt, Shalon E, NP   3 mg at 12/08/24 0934   methimazole  (TAPAZOLE ) tablet 10 mg  10 mg Oral Daily Nkwenti, Doris, NP   10 mg at 12/08/24 9071   OLANZapine  (ZYPREXA ) injection 5 mg  5 mg Intramuscular TID PRN Tex Drilling, NP       OLANZapine  (ZYPREXA ) injection 5 mg  5 mg Intramuscular TID PRN Tex Drilling, NP       OLANZapine  (ZYPREXA ) tablet 20 mg  20 mg Oral QHS Danica Camarena, MD   20 mg at 12/07/24 2127   OLANZapine  zydis (ZYPREXA ) disintegrating tablet 5 mg  5 mg Oral TID PRN Tex Drilling, NP       OLANZapine  zydis (ZYPREXA ) disintegrating tablet 5 mg  5 mg Oral TID PRN Tex Drilling, NP       paliperidone  (INVEGA ) 24 hr tablet 3 mg  3 mg Oral Daily Shrivastava, Aryendra, MD   3 mg at 12/08/24 9071   pantoprazole  (PROTONIX ) EC tablet 40 mg  40 mg Oral Daily Nkwenti, Doris, NP   40 mg at 12/08/24 9071   traZODone  (DESYREL ) tablet 50 mg  50 mg Oral QHS PRN Tex Drilling, NP   50 mg at 12/06/24 2109    Lab Results:  No results found for this or any previous visit (from the past 48 hours).       Blood Alcohol level:  Lab Results  Component Value Date   [facility] <15 10/14/2024   ETH <10 07/02/2022    Metabolic Disorder Labs: Lab  Results  Component Value Date   HGBA1C 5.5 10/14/2024   MPG 111.15 10/14/2024   MPG 137 01/06/2022   Lab Results  Component Value Date   PROLACTIN 5.8 10/14/2024   Lab Results  Component Value Date   CHOL 167 10/14/2024   TRIG 68 10/14/2024   HDL 60 10/14/2024   CHOLHDL 2.8 10/14/2024   VLDL 14 10/14/2024   LDLCALC 93 10/14/2024   LDLCALC 73 03/13/2024    Physical Findings: AIMS:  , ,  ,  ,    CIWA:    COWS:      Psychiatric Specialty Exam:  Presentation  General Appearance:  Appropriate for Environment  Eye Contact: Fleeting  Speech: Normal Rate  Speech Volume: Decreased    Mood and Affect  Mood:fine  Affect: Flat   Thought Process  Thought Processes: impoverished  Orientation:Partial  Thought Content: Chronic delusions Hallucinations: Denies  Ideas of Reference:Delusions; Paranoia  Suicidal Thoughts: Denies  Homicidal Thoughts: Denies   Sensorium  Memory: Immediate Fair; Recent Fair  Judgment: Impaired  Insight: Shallow   Executive Functions  Concentration: Fair  Attention Span: Fair  Recall: Poor  Fund of Knowledge: Fair  Language: Fair   Psychomotor Activity  Psychomotor Activity: No data recorded  Musculoskeletal: Strength & Muscle Tone: within normal limits Gait & Station: normal Assets  Assets: Manufacturing Systems Engineer; Desire for Improvement; Social Support    Physical Exam: Physical Exam Vitals and nursing note reviewed.    ROS Blood pressure (!) 152/69, pulse 98, temperature 98.3 F (36.8 C), resp. rate 18, height 5' 8 (1.727 m), weight 48.3 kg, SpO2 100%. Body mass index is 16.19 kg/m.  Diagnosis: Principal Problem:   Schizophrenia,  paranoid (HCC) Major neurocognitive disorder-slums total score of 13/30   Treatment Plan Summary: APS referral has been made as patient lacks capacity to make medical decisions.  Recommending legal guardianship and further placement at appropriate level of  care  Safety and Monitoring:             -- Involuntary admission to inpatient psychiatric unit for safety, stabilization and treatment             -- Daily contact with patient to assess and evaluate symptoms and progress in treatment             -- Patient's case to be discussed in multi-disciplinary team meeting             -- Observation Level: q15 minute checks             -- Vital signs:  q12 hours             -- Precautions: suicide, elopement, and assault   2. Psychiatric Diagnoses and Treatment:  Check Depakote  levels 11/20/2024-60 Invega  at 3 mg increased to 6 mg           Depakote  500 mg every morning and 1500 mg   Zyprexa  20 mg nightly  -- The risks/benefits/side-effects/alternatives to this medication were discussed in detail with the patient and time was given for questions. The patient consents to medication trial.                -- Metabolic profile and EKG monitoring obtained while on an atypical antipsychotic (BMI: Lipid Panel: HbgA1c: QTc:)              -- Encouraged patient to participate in unit milieu and in scheduled group therapies   Occupational Therapy recommendations If plan is discharge home, recommend the following:   Direct supervision/assist for medications management;Direct supervision/assist for financial management;Assist for transportation;Assistance with cooking/housework    Ms Holsworth was seen for OT treatment on this date. Upon arrival to room pt in dayroom, agreeable to tx. The SLUMS is a 30-point screening questionnaire that tests orientation, memory, attention, problem solving, and executive function. Pt scored a 13/30 indicating Dementia. Of note, it is not within occupational therapy scope of practice to diagnose cognitive impairments, this screen indicates need for further testing. Pt with noted impairments in memory and problem solving limiting ability to participate functionally in medication management, bill management, and safety cooking. Will  continue to follow POC. Continue to recommend SUPERVISION for safety with iADLs.    [facility] Mental Status Examination Orientation: 2/3  Calculations: 0/3 Naming animals: 2/3 Patient named 14 animals (0 points is 0-4 animals; 1 is 5-9 animals; 2 is 10-14 animals; 3 is 15+ animals) Recall: 3/5  Attention: 1/2 Clock drawing: 1/4 Visual Processing: 2/2 Paragraph Memory: 2/8  Total: 13/30;  Given that patient has completed high school, this score falls in the dementia range.   4. Discharge Planning:   -- Social work and case management to assist with discharge planning and identification of hospital follow-up needs prior to discharge  -- Estimated LOS: 3-4 days  Shari JONELLE Manners, MD 12/08/2024, 5:46 PM

## 2024-12-23 NOTE — Group Note (Signed)
 Recreation Therapy Group Note   Group Topic:Coping Skills  Group Date: 12/23/2024 Start Time: 1400 End Time: 1435 Facilitators: Celestia Jeoffrey BRAVO, LRT, CTRS Location: Dayroom  Group Description: Meditation. LRT and patients discussed what they know about meditation and mindfulness. LRT played a Deep Breathing Meditation exercise script for patients to follow along to. LRT and patients discussed how meditation and deep breathing can be used as a coping skill post--discharge to help manage symptoms of stress.   Goal Area(s) Addressed: Patient will practice using relaxation technique. Patient will identify a new coping skill.  Patient will follow multistep directions to reduce anxiety and stress.   Affect/Mood: N/A   Participation Level: Did not attend    Clinical Observations/Individualized Feedback: Patient did not attend.  Plan: Continue to engage patient in RT group sessions 2-3x/week.   Jeoffrey BRAVO Celestia, LRT, CTRS 12/23/2024 4:57 PM

## 2024-12-23 NOTE — Group Note (Signed)
 Date:  12/23/2024 Time:  8:54 PM  Group Topic/Focus:  Personal Choices and Values:   The focus of this group is to help patients assess and explore the importance of values in their lives, how their values affect their decisions, how they express their values and what opposes their expression.    Participation Level:  Active  Participation Quality:  Appropriate  Affect:  Appropriate  Cognitive:  Appropriate  Insight: Appropriate  Engagement in Group:  Engaged  Modes of Intervention:  Activity  Additional Comments:    Gwendlyn LITTIE Sharps 12/23/2024, 8:54 PM

## 2024-12-23 NOTE — Plan of Care (Signed)
   Problem: Education: Goal: Knowledge of Shari [name] General Education information/materials will improve Outcome: Progressing Goal: Emotional status will improve Outcome: Progressing Goal: Mental status will improve Outcome: Progressing Goal: Verbalization of understanding the information provided will improve Outcome: Progressing   Problem: Activity: Goal: Interest or engagement in activities will improve Outcome: Progressing Goal: Sleeping patterns will improve Outcome: Progressing   Problem: Coping: Goal: Ability to verbalize frustrations and anger appropriately will improve Outcome: Progressing Goal: Ability to demonstrate self-control will improve Outcome: Progressing

## 2024-12-23 NOTE — Plan of Care (Signed)

## 2024-12-24 DIAGNOSIS — F200 Paranoid schizophrenia: Secondary | ICD-10-CM | POA: Diagnosis not present

## 2024-12-24 DIAGNOSIS — F0390 Unspecified dementia without behavioral disturbance: Secondary | ICD-10-CM | POA: Diagnosis not present

## 2024-12-24 NOTE — BH IP Treatment Plan (Signed)
 Interdisciplinary Treatment and Diagnostic Plan Update  12/24/2024 Time of Session: 2:45PM Shari [name] MRN: [id number]  Principal Diagnosis: Schizophrenia, paranoid (HCC)  Secondary Diagnoses: Principal Problem:   Schizophrenia, paranoid (HCC)   Current Medications:  Current Facility-Administered Medications  Medication Dose Route Frequency Provider Last Rate Last Admin   acetaminophen  (TYLENOL ) tablet 650 mg  650 mg Oral Q6H PRN Tex Drilling, NP   650 mg at 12/23/24 2115   alum & mag hydroxide-simeth (MAALOX/MYLANTA) 200-200-20 MG/5ML suspension 30 mL  30 mL Oral Q4H PRN Tex Drilling, NP       cloNIDine  (CATAPRES ) tablet 0.1 mg  0.1 mg Oral BID PRN Tex Drilling, NP       diclofenac  Sodium (VOLTAREN ) 1 % topical gel 4 g  4 g Topical QID PRN Jadapalle, Sree, MD       divalproex  (DEPAKOTE ) DR tablet 1,500 mg  1,500 mg Oral QHS Jadapalle, Sree, MD   1,500 mg at 12/23/24 2115   divalproex  (DEPAKOTE ) DR tablet 500 mg  500 mg Oral q AM Jadapalle, Sree, MD   500 mg at 12/24/24 9078   famotidine  (PEPCID ) 40 MG/5ML suspension 40 mg  40 mg Oral Daily Jadapalle, Sree, MD   40 mg at 12/24/24 0920   lidocaine  (LIDODERM ) 5 % 1 patch  1 patch Transdermal Q24H Laurita Manor T, MD   1 patch at 12/24/24 1346   losartan  (COZAAR ) tablet 25 mg  25 mg Oral Daily Nkwenti, Doris, NP   25 mg at 12/24/24 0920   magnesium  hydroxide (MILK OF MAGNESIA) suspension 30 mL  30 mL Oral Daily PRN Tex Drilling, NP       menthol  (CEPACOL) lozenge 3 mg  1 lozenge Oral PRN Bobbitt, Shalon E, NP   3 mg at 12/23/24 2116   methimazole  (TAPAZOLE ) tablet 10 mg  10 mg Oral Daily Nkwenti, Doris, NP   10 mg at 12/24/24 9078   OLANZapine  (ZYPREXA ) injection 5 mg  5 mg Intramuscular TID PRN Tex Drilling, NP       OLANZapine  (ZYPREXA ) injection 5 mg  5 mg Intramuscular TID PRN Tex Drilling, NP       OLANZapine  (ZYPREXA ) tablet 20 mg  20 mg Oral QHS Jadapalle, Sree, MD   20 mg at 12/23/24 2116   OLANZapine  zydis (ZYPREXA )  disintegrating tablet 5 mg  5 mg Oral TID PRN Tex Drilling, NP       OLANZapine  zydis (ZYPREXA ) disintegrating tablet 5 mg  5 mg Oral TID PRN Tex Drilling, NP       paliperidone  (INVEGA ) 24 hr tablet 6 mg  6 mg Oral Daily Jadapalle, Sree, MD   6 mg at 12/24/24 9078   pantoprazole  (PROTONIX ) EC tablet 40 mg  40 mg Oral BID AC Zhang, Ping T, MD   40 mg at 12/24/24 9078   sucralfate  (CARAFATE ) 1 GM/10ML suspension 1 g  1 g Oral TID WC & HS Laurita Manor T, MD   1 g at 12/24/24 1206   traZODone  (DESYREL ) tablet 50 mg  50 mg Oral QHS PRN Tex Drilling, NP   50 mg at 12/23/24 2115   PTA Medications: Medications Prior to Admission  Medication Sig Dispense Refill Last Dose/Taking   Ascorbic Acid (VITAMIN C PO) Take 1 tablet by mouth daily. (Patient not taking: Reported on 10/14/2024)      divalproex  (DEPAKOTE ) 500 MG DR tablet Take 1 tablet (500 mg total) by mouth at bedtime. (Patient not taking: Reported on 10/14/2024) 30 tablet 1  losartan  (COZAAR ) 25 MG tablet Take 1 tablet (25 mg total) by mouth daily. (Patient not taking: Reported on 10/14/2024) 90 tablet 1    methimazole  (TAPAZOLE ) 10 MG tablet Take 1 tablet (10 mg total) by mouth daily. (Patient not taking: Reported on 10/14/2024) 90 tablet 1    OLANZapine  (ZYPREXA ) 10 MG tablet Take 1 tablet (10 mg total) by mouth at bedtime. (Patient not taking: Reported on 10/14/2024) 30 tablet 1    pantoprazole  (PROTONIX ) 40 MG tablet Take 1 tablet (40 mg total) by mouth daily. (Patient not taking: Reported on 10/14/2024) 90 tablet 0    propranolol  (INDERAL ) 10 MG tablet Take 1 tablet (10 mg total) by mouth 2 (two) times daily. (Patient not taking: Reported on 10/14/2024) 180 tablet 1    VITAMIN D PO Take 1 tablet by mouth daily. (Patient not taking: Reported on 10/14/2024)      VITAMIN E PO Take 1 tablet by mouth daily. (Patient not taking: Reported on 10/14/2024)       Patient Stressors: Medication change or noncompliance    Patient Strengths:  Communication skills   Treatment Modalities: Medication Management, Group therapy, Case management,  1 to 1 session with clinician, Psychoeducation, Recreational therapy.   Physician Treatment Plan for Primary Diagnosis: Schizophrenia, paranoid (HCC) Long Term Goal(s): Improvement in symptoms so as ready for discharge   Short Term Goals: Ability to identify changes in lifestyle to reduce recurrence of condition will improve Ability to verbalize feelings will improve Ability to disclose and discuss suicidal ideas Ability to demonstrate self-control will improve Ability to identify and develop effective coping behaviors will improve Ability to maintain clinical measurements within normal limits will improve  Medication Management: Evaluate patient's response, side effects, and tolerance of medication regimen.  Therapeutic Interventions: 1 to 1 sessions, Unit Group sessions and Medication administration.  Evaluation of Outcomes: Progressing  Physician Treatment Plan for Secondary Diagnosis: Principal Problem:   Schizophrenia, paranoid (HCC)  Long Term Goal(s): Improvement in symptoms so as ready for discharge   Short Term Goals: Ability to identify changes in lifestyle to reduce recurrence of condition will improve Ability to verbalize feelings will improve Ability to disclose and discuss suicidal ideas Ability to demonstrate self-control will improve Ability to identify and develop effective coping behaviors will improve Ability to maintain clinical measurements within normal limits will improve     Medication Management: Evaluate patient's response, side effects, and tolerance of medication regimen.  Therapeutic Interventions: 1 to 1 sessions, Unit Group sessions and Medication administration.  Evaluation of Outcomes: Progressing   RN Treatment Plan for Primary Diagnosis: Schizophrenia, paranoid (HCC) Long Term Goal(s): Knowledge of disease and therapeutic regimen to maintain  health will improve  Short Term Goals: Ability to demonstrate self-control, Ability to participate in decision making will improve, Ability to verbalize feelings will improve, Ability to disclose and discuss suicidal ideas, Ability to identify and develop effective coping behaviors will improve, and Compliance with prescribed medications will improve  Medication Management: RN will administer medications as ordered by provider, will assess and evaluate patient's response and provide education to patient for prescribed medication. RN will report any adverse and/or side effects to prescribing provider.  Therapeutic Interventions: 1 on 1 counseling sessions, Psychoeducation, Medication administration, Evaluate responses to treatment, Monitor vital signs and CBGs as ordered, Perform/monitor CIWA, COWS, AIMS and Fall Risk screenings as ordered, Perform wound care treatments as ordered.  Evaluation of Outcomes: Progressing   LCSW Treatment Plan for Primary Diagnosis: Schizophrenia, paranoid (HCC) Long Term  Goal(s): Safe transition to appropriate next level of care at discharge, Engage patient in therapeutic group addressing interpersonal concerns.  Short Term Goals: Engage patient in aftercare planning with referrals and resources, Increase ability to appropriately verbalize feelings, Increase emotional regulation, Facilitate acceptance of mental health diagnosis and concerns, and Increase skills for wellness and recovery  Therapeutic Interventions: Assess for all discharge needs, 1 to 1 time with Social worker, Explore available resources and support systems, Assess for adequacy in community support network, Educate family and significant other(s) on suicide prevention, Complete Psychosocial Assessment, Interpersonal group therapy.  Evaluation of Outcomes: Progressing   Progress in Treatment: Attending groups: No. Participating in groups: No. Taking medication as prescribed: Yes. Toleration  medication: Yes. Family/Significant other contact made: No, will contact:  once permission has been granted Patient understands diagnosis: No. Discussing patient identified problems/goals with staff: Yes. Medical problems stabilized or resolved: Yes. Denies suicidal/homicidal ideation: Yes. Issues/concerns per patient self-inventory: No. Other: none  New problem(s) identified: 12/14/24 no new changes  New Short Term/Long Term Goal(s):Update 12/14/2024 no new changes  Update 12/19/2024: No changes at this time.  Update 12/25/2023: No changes at this time.    Patient Goals:    I haven't set any goals for the hospital yet. I don't need psychiatric help 10/23/24 Update: No changes at this time. 10/28/24 Update: No changes at this time. Update 11/03/24: No changes at this time Update 11/09/24: No changes at this time Update 11/14/24: No changes at this time2/21/25 Update: No changes at this time. Update 11/29/2024: No changes at this time.   Update 12/04/2024: No changes at this time. Update 12/09/2024: No changes at this time.   12/14/24 no new changes  Update 12/19/2024: No changes at this time. Update 12/25/2023: No changes at this time.    Discharge Plan or Barriers: CSW will assist with appropriate discharge plan 10/23/24 Update: No changes at this time. 10/28/24 Update: CSW to continue to meet with DSS to engage in safe discharge planning and connect patient with appropriate resources. Update 11/03/24: No changes at this time Update 11/09/24: No changes at this time Update 11/14/24: APS drafting a petition for guardianship, awaiting updates regarding scheduled court date at this time. APS has received FL2 to begin placement search. 11/24/24 Update: No changes at this time.  Update 11/29/2024: Placement continues to be sought at this time.  Update 12/04/2024: No updates on placement search at this time Update 12/09/2024: No changes at this time.  Update:12/14/24 no new changes  Update 12/19/2024: No changes  at this time. Update 12/25/2023: APS continues to look for placement for patient. No plan identified.     Reason for Continuation of Hospitalization: Delusions  12/14/24 no changes   Estimated Length of Stay:12/14/2024 TBD  Update 12/19/2024: TBD Update 12/25/2023: TBD  Last 3 [place] Suicide Severity Risk Score: Flowsheet Row Admission (Current) from 10/17/2024 in [facility] from 10/14/2024 in [facility] to Hosp-Admission (Discharged) from 03/13/2024 in [facility]  C-SSRS RISK CATEGORY No Risk No Risk No Risk    Last PHQ 2/9 Scores:     No data to display          Scribe for Treatment Team: Shari [name], Shari [name] 12/24/2024 3:41 PM

## 2024-12-24 NOTE — Plan of Care (Signed)
   Problem: Coping: Goal: Ability to verbalize frustrations and anger appropriately will improve Outcome: Progressing Goal: Ability to demonstrate self-control will improve Outcome: Progressing

## 2024-12-24 NOTE — Progress Notes (Signed)
" °   12/23/24 2000  Psych Admission Type (Psych Patients Only)  Admission Status Voluntary  Psychosocial Assessment  Patient Complaints Worrying  Eye Contact Fair  Facial Expression Animated  Affect Appropriate to circumstance  Speech Logical/coherent  Interaction Minimal  Motor Activity Slow  Appearance/Hygiene Unremarkable  Behavior Characteristics Cooperative;Appropriate to situation  Mood Pleasant  Thought Process  Coherency WDL  Content WDL  Delusions None reported or observed  Perception WDL  Hallucination None reported or observed  Judgment WDL  Confusion None  Danger to Self  Current suicidal ideation? Denies  Agreement Not to Harm Self Yes  Description of Agreement verbal  Danger to Others  Danger to Others None reported or observed   Mood/Behavior:  Pleasant and cooperative. Pt mentioned being worried about still being here in the hospital.   Psych assessment: Denies SI/HI and AVH.     Interaction / Group attendance:  Present in the milieu. Minimal interaction with peers and staff.  Attended group.   Medication/ PRNs: Compliant with scheduled medications. Required PRNs Trazodone  for sleep and noted effective. Required prn Tylenol  for shoulder pain noted effective.    Pain: R shoulder pain    15 min checks in place for safety. "

## 2024-12-24 NOTE — Progress Notes (Signed)
" °   12/24/24 1000  Psych Admission Type (Psych Patients Only)  Admission Status Voluntary  Psychosocial Assessment  Patient Complaints Worrying  Eye Contact Fair  Facial Expression Animated  Affect Appropriate to circumstance  Speech Logical/coherent  Interaction Minimal  Motor Activity Slow  Appearance/Hygiene Unremarkable  Behavior Characteristics Cooperative  Mood Pleasant  Thought Process  Coherency WDL  Content WDL  Delusions None reported or observed  Perception WDL  Hallucination None reported or observed  Judgment WDL  Confusion None  Danger to Self  Current suicidal ideation? Denies  Danger to Others  Danger to Others None reported or observed    "

## 2024-12-24 NOTE — [facility] Counselor (Signed)
 CSW left VM for [name], [facility] APS, [phone].  Sherryle Margo, MSW, LCSW 12/24/2024 10:28 AM

## 2024-12-24 NOTE — Group Note (Signed)
 Date:  12/24/2024 Time:  11:23 PM  Group Topic/Focus:  Wrap-Up Group:   The focus of this group is to help patients review their daily goal of treatment and discuss progress on daily workbooks.    Participation Level:  Active  Participation Quality:  Appropriate  Affect:  Appropriate  Cognitive:  Alert  Insight: Appropriate  Engagement in Group:  Engaged  Modes of Intervention:  Discussion  Additional Comments:    [name] [name] 12/24/2024, 11:23 PM

## 2024-12-24 NOTE — Group Note (Signed)
 LCSW Group Therapy Note    Group Date: 12/24/2024 Start Time: 1300 End Time: 1400   Type of Therapy and Topic: Group Therapy: Body Image  Participation Level:  Did Not Attend  Description of Group:  Patients were educated about body image and asked to think about whether they have a healthy or unhealthy body image. Patients were led in a discussion about factors that contribute to body image, both internal and external. Patients were asked to discuss strengths of the human body outside of appearance, such as being able to fight off diseases and provide stress relief. Lastly, patients were asked to identify one way in which they appreciate their own body outside of appearance.   Therapeutic Goals:   1. Patient will differentiate between a healthy and unhealthy body image. 2. Patient will identify what contributes to body image 3. Patient will discuss the strengths of the human body. 4. Patient will identify a positive attribute of their body outside of physical appearance.  Summary of Patient Progress:  Patient did not attend.   Therapeutic Modalities: Cognitive Behavioral Therapy; Solution-Focused Therapy  Rashun Grattan M Lavender Stanke, LCSWA 12/24/2024  2:47 PM

## 2024-12-24 NOTE — Progress Notes (Signed)
 [facility] MD Progress Note   5:46 PM ORNELLA CODERRE  MRN:  [id number] Patient is a 69 year old female with a history of Schizoaffective Disorder, Bipolar Type who presents via [employer] under IVC, initiated by CM, Falencio with re-entry program, to [facility] for assessment. Per IVC, [name] has been diagnosed with Schizophrenia and is refusing to take medication. [name] isn't sleeping. Residents report her screaming and yelling all night long.  [name] responding to internal stimuli.  [name] verbally aggressive.  The respondent has locked all of the residents inside of the transitional housing home.  They could not leave the home which prompted the IVC.  Residents did not have access to food, the restroom or anything as a result of being locked in.  [name] is currently under Adult protective services care. Patient is admitted to [facility] unit with Q15 min safety monitoring. Multidisciplinary team approach is offered. Medication management; group/milieu therapy is offered.   Subjective:  Chart reviewed, case discussed in multidisciplinary meeting, patient seen during rounds.   On interview patient is noted to be resting in bed.  She offers no.  She denies any anxiety or depression.  She remains delusional about the.  She declines to accept legal guardianship process.  Per nursing patient is taking her medications with no reported side of no behavioral problems noted today on the unit.  [name] maintaining her appetite and sleep    The patient remains psychiatrically stable at this time. [name] awaiting placement, and continued inpatient care is medically necessary primarily due to disposition considerations rather than acute psychiatric decompensation.   Psychiatric History: see h&P Family History:  Family History  Problem Relation Age of Onset   Diabetes Mother    CAD Mother    Lung cancer Father    Social History:  Social History   Substance and Sexual Activity  Alcohol  Use Yes   Comment: beer occasionally     Social History   Substance and Sexual Activity  Drug Use No    Social History   Socioeconomic History   Marital status: Widowed    Spouse name: Not on file   Number of children: Not on file   Years of education: Not on file   Highest education level: Not on file  Occupational History   Not on file  Tobacco Use   Smoking status: Every Day    Current packs/day: 0.50    Average packs/day: 0.5 packs/day for 40.0 years (20.0 ttl pk-yrs)    Types: Cigarettes   Smokeless tobacco: Never  Vaping Use   Vaping status: Never Used  Substance and Sexual Activity   Alcohol use: Yes    Comment: beer occasionally   Drug use: No   Sexual activity: Not Currently    Birth control/protection: Post-menopausal  Other Topics Concern   Not on file  Social History Narrative   Not on file   Social Drivers of Health   Tobacco Use: High Risk (11/02/2024)   Patient History    Smoking Tobacco Use: Every Day    Smokeless Tobacco Use: Never    Passive Exposure: Not on file  Financial Resource Strain: Not on file  Food Insecurity: No Food Insecurity (10/17/2024)   Epic    Worried About Programme Researcher, Broadcasting/film/video in the Last Year: Never true    Ran Out of Food in the Last Year: Never true  Transportation Needs: No Transportation Needs (10/17/2024)   Epic  Lack of Transportation (Medical): No    Lack of Transportation (Non-Medical): No  Recent Concern: Transportation Needs - Unmet Transportation Needs (10/14/2024)   Epic    Lack of Transportation (Medical): Yes    Lack of Transportation (Non-Medical): Yes  Physical Activity: Not on file  Stress: Not on file  Social Connections: Unknown (10/17/2024)   Social Connection and Isolation Panel    Frequency of Communication with Friends and Family: Patient unable to answer    Frequency of Social Gatherings with Friends and Family: Patient unable to answer    Attends Religious Services: Patient unable to answer     Active Member of Clubs or Organizations: Patient declined    Attends Banker Meetings: Patient unable to answer    Marital Status: Widowed  Depression (PHQ2-9): Not on file  Alcohol Screen: Low Risk (10/17/2024)   Alcohol Screen    Last Alcohol Screening Score (AUDIT): 0  Housing: Low Risk (10/17/2024)   Epic    Unable to Pay for Housing in the Last Year: No    Number of Times Moved in the Last Year: 0    Homeless in the Last Year: No  Utilities: Not At Risk (10/17/2024)   Epic    Threatened with loss of utilities: No  Health Literacy: Not on file   Past Medical History:  Past Medical History:  Diagnosis Date   Eye globe prosthesis    GERD (gastroesophageal reflux disease)    History of blood transfusion 1973   related to abscess burst in my stomach   Hyperlipemia    Hyperlipidemia    Hypertension    Osteoarthritis of back    Lowerback    SVD (spontaneous vaginal delivery)    x 1, baby died at 2 wks of age   Type II diabetes mellitus (HCC)     Past Surgical History:  Procedure Laterality Date   APPENDECTOMY     COLONOSCOPY     DILATATION & CURETTAGE/HYSTEROSCOPY WITH MYOSURE N/A 02/25/2015   Procedure: DILATATION & CURETTAGE/HYSTEROSCOPY WITH MYOSURE;  Surgeon: Truman Corona, MD;  Location: [facility];  Service: Gynecology;  Laterality: N/A;   DILATION AND CURETTAGE OF UTERUS     ENUCLEATION Right 03/16/2017   ENUCLEATION Right 03/16/2017   Procedure: ENUCLEATION RIGHT EYE;  Surgeon: Loyd Kathryne Palm, MD;  Location: [facility] OR;  Service: Ophthalmology;  Laterality: Right;   EYE SURGERY     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6, 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   EYE SURGERY Right ~ 1974   S/P initial eye injury; scissors stuck in my eye   LAPAROSCOPIC CHOLECYSTECTOMY     SHOULDER ARTHROSCOPY WITH ROTATOR CUFF REPAIR Left    wire stitches per patient   SHOULDER ARTHROSCOPY WITH ROTATOR CUFF REPAIR Right     Current Medications: Current Facility-Administered Medications   Medication Dose Route Frequency Provider Last Rate Last Admin   acetaminophen  (TYLENOL ) tablet 650 mg  650 mg Oral Q6H PRN Tex Drilling, NP   650 mg at 12/07/24 2126   alum & mag hydroxide-simeth (MAALOX/MYLANTA) 200-200-20 MG/5ML suspension 30 mL  30 mL Oral Q4H PRN Tex Drilling, NP       cloNIDine  (CATAPRES ) tablet 0.1 mg  0.1 mg Oral BID PRN Tex Drilling, NP       divalproex  (DEPAKOTE ) DR tablet 1,500 mg  1,500 mg Oral QHS Erling Arrazola, MD   1,500 mg at 12/07/24 2126   divalproex  (DEPAKOTE ) DR tablet 500 mg  500 mg Oral q AM Bena Kobel,  Tanessa Tidd, MD   500 mg at 12/08/24 9192   losartan  (COZAAR ) tablet 25 mg  25 mg Oral Daily Tex Drilling, NP   25 mg at 12/08/24 9071   magnesium  hydroxide (MILK OF MAGNESIA) suspension 30 mL  30 mL Oral Daily PRN Tex Drilling, NP       menthol  (CEPACOL) lozenge 3 mg  1 lozenge Oral PRN Bobbitt, Shalon E, NP   3 mg at 12/08/24 9065   methimazole  (TAPAZOLE ) tablet 10 mg  10 mg Oral Daily Nkwenti, Doris, NP   10 mg at 12/08/24 9071   OLANZapine  (ZYPREXA ) injection 5 mg  5 mg Intramuscular TID PRN Tex Drilling, NP       OLANZapine  (ZYPREXA ) injection 5 mg  5 mg Intramuscular TID PRN Tex Drilling, NP       OLANZapine  (ZYPREXA ) tablet 20 mg  20 mg Oral QHS Fowler Antos, MD   20 mg at 12/07/24 2127   OLANZapine  zydis (ZYPREXA ) disintegrating tablet 5 mg  5 mg Oral TID PRN Tex Drilling, NP       OLANZapine  zydis (ZYPREXA ) disintegrating tablet 5 mg  5 mg Oral TID PRN Tex Drilling, NP       paliperidone  (INVEGA ) 24 hr tablet 3 mg  3 mg Oral Daily Shrivastava, Aryendra, MD   3 mg at 12/08/24 9071   pantoprazole  (PROTONIX ) EC tablet 40 mg  40 mg Oral Daily Nkwenti, Doris, NP   40 mg at 12/08/24 9071   traZODone  (DESYREL ) tablet 50 mg  50 mg Oral QHS PRN Tex Drilling, NP   50 mg at 12/06/24 2109    Lab Results:  No results found for this or any previous visit (from the past 48 hours).       Blood Alcohol level:  Lab Results  Component Value  Date   [facility] <15 10/14/2024   ETH <10 07/02/2022    Metabolic Disorder Labs: Lab Results  Component Value Date   HGBA1C 5.5 10/14/2024   MPG 111.15 10/14/2024   MPG 137 01/06/2022   Lab Results  Component Value Date   PROLACTIN 5.8 10/14/2024   Lab Results  Component Value Date   CHOL 167 10/14/2024   TRIG 68 10/14/2024   HDL 60 10/14/2024   CHOLHDL 2.8 10/14/2024   VLDL 14 10/14/2024   LDLCALC 93 10/14/2024   LDLCALC 73 03/13/2024    Physical Findings: AIMS:  , ,  ,  ,    CIWA:    COWS:      Psychiatric Specialty Exam:  Presentation  General Appearance:  Appropriate for Environment  Eye Contact: Fleeting  Speech: Normal Rate  Speech Volume: Decreased    Mood and Affect  Mood:fine  Affect: Flat   Thought Process  Thought Processes: impoverished  Orientation:Partial  Thought Content: Chronic delusions Hallucinations: Denies  Ideas of Reference:Delusions; Paranoia  Suicidal Thoughts: Denies  Homicidal Thoughts: Denies   Sensorium  Memory: Immediate Fair; Recent Fair  Judgment: Impaired  Insight: Shallow   Executive Functions  Concentration: Fair  Attention Span: Fair  Recall: Poor  Fund of Knowledge: Fair  Language: Fair   Psychomotor Activity  Psychomotor Activity: No data recorded  Musculoskeletal: Strength & Muscle Tone: within normal limits Gait & Station: normal Assets  Assets: Manufacturing Systems Engineer; Desire for Improvement; Social Support    Physical Exam: Physical Exam Vitals and nursing note reviewed.    ROS Blood pressure (!) 152/69, pulse 98, temperature 98.3 F (36.8 C), resp. rate 18, height 5' 8 (1.727  m), weight 48.3 kg, SpO2 100%. Body mass index is 16.19 kg/m.  Diagnosis: Principal Problem:   Schizophrenia, paranoid (HCC) Major neurocognitive disorder-slums total score of 13/30   Treatment Plan Summary: APS referral has been made as patient lacks capacity to make medical  decisions.  Recommending legal guardianship and further placement at appropriate level of care  Safety and Monitoring:             -- Involuntary admission to inpatient psychiatric unit for safety, stabilization and treatment             -- Daily contact with patient to assess and evaluate symptoms and progress in treatment             -- Patient's case to be discussed in multi-disciplinary team meeting             -- Observation Level: q15 minute checks             -- Vital signs:  q12 hours             -- Precautions: suicide, elopement, and assault   2. Psychiatric Diagnoses and Treatment:  Check Depakote  levels 11/20/2024-60 Invega  at 3 mg increased to 6 mg           Depakote  500 mg every morning and 1500 mg   Zyprexa  20 mg nightly  -- The risks/benefits/side-effects/alternatives to this medication were discussed in detail with the patient and time was given for questions. The patient consents to medication trial.                -- Metabolic profile and EKG monitoring obtained while on an atypical antipsychotic (BMI: Lipid Panel: HbgA1c: QTc:)              -- Encouraged patient to participate in unit milieu and in scheduled group therapies   Occupational Therapy recommendations If plan is discharge home, recommend the following:   Direct supervision/assist for medications management;Direct supervision/assist for financial management;Assist for transportation;Assistance with cooking/housework    Ms Nieblas was seen for OT treatment on this date. Upon arrival to room pt in dayroom, agreeable to tx. The SLUMS is a 30-point screening questionnaire that tests orientation, memory, attention, problem solving, and executive function. Pt scored a 13/30 indicating Dementia. Of note, it is not within occupational therapy scope of practice to diagnose cognitive impairments, this screen indicates need for further testing. Pt with noted impairments in memory and problem solving limiting ability to  participate functionally in medication management, bill management, and safety cooking. Will continue to follow POC. Continue to recommend SUPERVISION for safety with iADLs.    [facility] Mental Status Examination Orientation: 2/3  Calculations: 0/3 Naming animals: 2/3 Patient named 14 animals (0 points is 0-4 animals; 1 is 5-9 animals; 2 is 10-14 animals; 3 is 15+ animals) Recall: 3/5  Attention: 1/2 Clock drawing: 1/4 Visual Processing: 2/2 Paragraph Memory: 2/8  Total: 13/30;  Given that patient has completed high school, this score falls in the dementia range.   4. Discharge Planning:   -- Social work and case management to assist with discharge planning and identification of hospital follow-up needs prior to discharge  -- Estimated LOS: 3-4 days  Millie JONELLE Manners, MD 12/08/2024, 5:46 PM

## 2024-12-24 NOTE — Group Note (Signed)
 Recreation Therapy Group Note   Group Topic:Animal Assisted Therapy   Group Date: 12/24/2024 Start Time: 1030 End Time: 1100 Facilitators: Celestia Jeoffrey BRAVO, LRT, CTRS Location: Dayroom  Group Description: AAA. Animal-Assisted Activity provides opportunities for motivational, educational, therapeutic and/or recreational benefits to enhance quality of life. [name] and [name] visited the unit to interact with patients.   Goal Areas Addressed:  Reduced anxiety and stress Improved mood Increased social interaction Enhanced communication skills Reduced loneliness and isolation Improved emotional regulation   Affect/Mood: Appropriate   Participation Level: Active and Engaged   Participation Quality: Independent   Behavior: Calm and Cooperative   Speech/Thought Process: Coherent   Insight: Fair   Judgement: Fair    Modes of Intervention: Activity   Patient Response to Interventions:  Attentive, Engaged, and Interested    Education Outcome:  Acknowledges education   Clinical Observations/Individualized Feedback: Sherryl was active in their participation of session activities and group discussion. Pt interacted well with LRT and peers duration of session.    Plan: Continue to engage patient in RT group sessions 2-3x/week.   Jeoffrey BRAVO Celestia, LRT, CTRS 12/24/2024 11:50 AM

## 2024-12-24 NOTE — Group Note (Signed)
 Date:  12/24/2024 Time:  4:10 PM  Group Topic/Focus:  Managing Feelings:   The focus of this group is to identify what feelings patients have difficulty handling and develop a plan to handle them in a healthier way upon discharge.  Meditation is excellent for elderly patients as it improves cognitive health, reduces stress, manages chronic pain, boosts mood, enhances sleep, and increases emotional resilience, helping combat age-related issues like memory loss, anxiety, and loneliness by promoting brain plasticity and a sense of calm, making it a simple, adaptable tool for better overall quality of life.   Participation Level:  Active  Participation Quality:  Appropriate  Affect:  Appropriate  Cognitive:  Appropriate  Insight: Appropriate  Engagement in Group:  Engaged  Modes of Intervention:  Activity  Additional Comments:  N/A  Harlene LITTIE Gavel 12/24/2024, 4:10 PM

## 2024-12-25 MED ORDER — FAMOTIDINE 40 MG/5ML PO SUSR
40.0000 mg | Freq: Every day | ORAL | Status: AC
  Administered 2024-12-26 – 2025-01-10 (×16): 40 mg via ORAL
  Filled 2024-12-25 (×16): qty 5

## 2024-12-25 NOTE — Progress Notes (Signed)
 Pt A&Ox3. Disoriented to situation. She denies SI/HI/AVH, anxiety and depression this shift. She denied pain this shift. She slept well through the night with PRN Trazodone  50 mg. Q 15 min safety checks in place.   12/24/24 2109  Psych Admission Type (Psych Patients Only)  Admission Status Voluntary  Psychosocial Assessment  Patient Complaints None  Eye Contact Fair  Facial Expression Other (Comment) (wnl)  Affect Appropriate to circumstance  Speech Logical/coherent;Soft  Interaction Minimal  Motor Activity Slow  Appearance/Hygiene Unremarkable  Behavior Characteristics Cooperative;Calm  Mood Pleasant  Thought Process  Coherency WDL  Content WDL  Delusions None reported or observed  Perception WDL  Hallucination None reported or observed  Judgment WDL  Confusion None  Danger to Self  Current suicidal ideation? Denies  Danger to Others  Danger to Others None reported or observed

## 2024-12-25 NOTE — [facility] Group Notes (Signed)
 Spirituality Group   Group Goal: Support / Education around grief and loss   Group Description: Following introductions and group rules, group members engaged in facilitated group dialog and support around topic of loss, with particular support around experiences of loss in their lives. Group members identified types of loss (relationships / self / things) as well as patterns, circumstances, and changes that precipitate loss. Reflection invited on thoughts / feelings around loss, normalized grief responses, and recognized variety in grief experience. Group noted Worden's four tasks of grief in discussion. Group drew on Adlerian / Rogerian, narrative, MI, with [place] group therapy as a primary framework.   Observations: Alisse was reserved but still engaged in the group discussion. Asked to speak one on one with chaplain after group.  Jeff Frieden L. Delores HERO.Div

## 2024-12-25 NOTE — Progress Notes (Signed)
" °   12/25/24 1515  Spiritual Encounters  Type of Visit Follow up  Care provided to: Patient  Referral source Patient request  Reason for visit Routine spiritual support  OnCall Visit No   I met with Ms Bryer Gottsch at her request. We met in her room.  Ms Blaney shared documents that reflect recent actions in court relating to APS actions. She also debriefed with me for first time around her memory of coming to hospital and police presence.  I provided active listening and compassionate, empathic presence. I affirmed Ms Haydon's feelings of uncertainty and feelings about the way events unfolded. I invited her to have an open mind about the legal process, offering that APS indicates an effort to protect her interests. I thanked her for trusting me and offered encouragement and ongoing willingness to listen and support as able.  Trameka Dorough L. Delores HERO.Div "

## 2024-12-25 NOTE — Group Note (Signed)
 Date:  12/25/2024 Time:  11:50 AM  Group Topic/Focus:  Group Exercise    Participation Level:  Active  Participation Quality:  Attentive  Affect:  Appropriate  Cognitive:  Appropriate  Insight: Good  Engagement in Group:  Improving  Modes of Intervention:  Activity  Additional Comments:  Pt attended group  Andie Mungin E Jovi Alvizo 12/25/2024, 11:50 AM

## 2024-12-25 NOTE — Plan of Care (Signed)
   Problem: Activity: Goal: Interest or engagement in activities will improve Outcome: Progressing Goal: Sleeping patterns will improve Outcome: Progressing

## 2024-12-25 NOTE — Group Note (Signed)
 Date:  12/25/2024 Time:  8:53 PM  Group Topic/Focus:  Self Care:   The focus of this group is to help patients understand the importance of self-care in order to improve or restore emotional, physical, spiritual, interpersonal, and financial health.    Participation Level:  Active  Participation Quality:  Appropriate  Affect:  Appropriate  Cognitive:  Appropriate  Insight: Appropriate and Good  Engagement in Group:  Developing/Improving and Engaged  Modes of Intervention:  Discussion  Additional Comments:    Gwendlyn LITTIE Sharps 12/25/2024, 8:53 PM

## 2024-12-25 NOTE — Plan of Care (Signed)
   Problem: Education: Goal: Emotional status will improve Outcome: Progressing Goal: Mental status will improve Outcome: Progressing Goal: Verbalization of understanding the information provided will improve Outcome: Progressing   Problem: Activity: Goal: Interest or engagement in activities will improve Outcome: Progressing

## 2024-12-25 NOTE — Progress Notes (Signed)
" °   12/25/24 1200  Psych Admission Type (Psych Patients Only)  Admission Status Voluntary  Psychosocial Assessment  Patient Complaints None  Eye Contact Fair  Facial Expression Animated  Affect Appropriate to circumstance  Speech Logical/coherent  Interaction Minimal  Motor Activity Slow  Appearance/Hygiene Unremarkable  Behavior Characteristics Cooperative  Mood Pleasant  Thought Process  Coherency WDL  Content WDL  Delusions None reported or observed  Perception WDL  Hallucination None reported or observed  Judgment WDL  Confusion None  Danger to Self  Current suicidal ideation? Denies  Danger to Others  Danger to Others None reported or observed    "

## 2024-12-26 DIAGNOSIS — F200 Paranoid schizophrenia: Secondary | ICD-10-CM | POA: Diagnosis not present

## 2024-12-26 DIAGNOSIS — F0390 Unspecified dementia without behavioral disturbance: Secondary | ICD-10-CM | POA: Diagnosis not present

## 2024-12-26 MED ORDER — LIDOCAINE 5 % EX PTCH
1.0000 | MEDICATED_PATCH | CUTANEOUS | Status: DC
  Administered 2024-12-27 – 2024-12-29 (×3): 1 via TRANSDERMAL
  Filled 2024-12-26 (×5): qty 1

## 2024-12-26 NOTE — Plan of Care (Signed)

## 2024-12-26 NOTE — Progress Notes (Signed)
 Pt A&Ox3. Disoriented to situation. She denies SI/HI/AVH this shift. She voiced no complaints.   12/25/24 2108  Psych Admission Type (Psych Patients Only)  Admission Status Voluntary  Psychosocial Assessment  Patient Complaints None  Eye Contact Fair  Facial Expression Flat  Affect Flat  Speech Logical/coherent  Interaction Minimal  Motor Activity Slow  Appearance/Hygiene Unremarkable  Behavior Characteristics Cooperative  Mood Pleasant  Thought Process  Coherency WDL  Content WDL  Delusions None reported or observed  Perception WDL  Hallucination None reported or observed  Judgment WDL  Confusion None  Danger to Self  Current suicidal ideation? Denies  Danger to Others  Danger to Others None reported or observed

## 2024-12-26 NOTE — Progress Notes (Signed)
" °   12/26/24 1400  Psych Admission Type (Psych Patients Only)  Admission Status Voluntary  Psychosocial Assessment  Patient Complaints None  Eye Contact Fair  Facial Expression Flat  Affect Flat  Speech Logical/coherent  Interaction Minimal  Motor Activity Slow  Appearance/Hygiene Unremarkable  Behavior Characteristics Cooperative  Mood Pleasant  Thought Process  Coherency WDL  Content WDL  Delusions None reported or observed  Perception WDL  Hallucination None reported or observed  Judgment WDL  Confusion None  Danger to Self  Current suicidal ideation? Denies  Danger to Others  Danger to Others None reported or observed    "

## 2024-12-26 NOTE — Progress Notes (Signed)
" °   12/26/24 2200  Psych Admission Type (Psych Patients Only)  Admission Status Voluntary  Psychosocial Assessment  Patient Complaints None  Eye Contact Fair  Facial Expression Flat  Affect Flat  Speech Logical/coherent  Interaction Minimal  Motor Activity Slow  Appearance/Hygiene Unremarkable  Behavior Characteristics Cooperative;Appropriate to situation  Mood Pleasant  Thought Process  Coherency WDL  Content WDL  Delusions None reported or observed  Perception WDL  Hallucination None reported or observed  Judgment WDL  Confusion None  Danger to Self  Current suicidal ideation? Denies  Danger to Others  Danger to Others None reported or observed   Mood/Behavior:  Pleasant and cooperative.    Psych assessment: Denies SI/HI and AVH.     Interaction / Group attendance:  Present in the milieu. Minimal interaction with peers and staff.  Attended group.   Medication/ PRNs: Compliant with scheduled medications. Required PRNs Trazodone  for sleep and noted effective. Pt required prn lozenge noted effective. PRN Tylenol  for shoulder pain noted effective.   Pain: Shoulder pain  15 min checks in place for safety. "

## 2024-12-26 NOTE — Group Note (Signed)
 Date:  12/26/2024 Time:  9:33 PM  Group Topic/Focus:  Goals Group:   The focus of this group is to help patients establish daily goals to achieve during treatment and discuss how the patient can incorporate goal setting into their daily lives to aide in recovery.    Participation Level:  Active  Participation Quality:  Appropriate  Affect:  Appropriate  Cognitive:  Appropriate  Insight: Appropriate  Engagement in Group:  Engaged  Modes of Intervention:  Activity  Additional Comments:    Shari [name] 12/26/2024, 9:33 PM

## 2024-12-26 NOTE — Group Note (Unsigned)
 Date:  12/26/2024 Time:  12:17 PM  Group Topic/Focus:    group   Participation Level:  {B[facility] PARTICIPATION OZCZO:77735}  Participation Quality:  {B[facility] PARTICIPATION QUALITY:22265}  Affect:  {B[facility] AFFECT:22266}  Cognitive:  {[facility] COGNITIVE:22267}  Insight: {BHH Insight2:20797}  Engagement in Group:  {B[facility] ENGAGEMENT IN HMNLE:77731}  Modes of Intervention:  {BHH MODES OF INTERVENTION:22269}  Additional Comments:  ***  Shari [name] 12/26/2024, 12:17 PM

## 2024-12-26 NOTE — Group Note (Signed)
 [facility] LCSW Group Therapy Note   Group Date: 12/26/2024 Start Time: 1315 End Time: 1345   Type of Therapy/Topic:  Group Therapy:  Emotion Regulation  Participation Level:  Did Not Attend   Mood:  Description of Group:    The purpose of this group is to assist patients in learning to regulate negative emotions and experience positive emotions. Patients will be guided to discuss ways in which they have been vulnerable to their negative emotions. These vulnerabilities will be juxtaposed with experiences of positive emotions or situations, and patients challenged to use positive emotions to combat negative ones. Special emphasis will be placed on coping with negative emotions in conflict situations, and patients will process healthy conflict resolution skills.  Therapeutic Goals: Patient will identify two positive emotions or experiences to reflect on in order to balance out negative emotions:  Patient will label two or more emotions that they find the most difficult to experience:  Patient will be able to demonstrate positive conflict resolution skills through discussion or role plays:   Summary of Patient Progress:   X    Therapeutic Modalities:   Cognitive Behavioral Therapy Feelings Identification Dialectical Behavioral Therapy   Shari [name], LCSWA

## 2024-12-26 NOTE — Plan of Care (Signed)
   Problem: Education: Goal: Emotional status will improve Outcome: Progressing Goal: Mental status will improve Outcome: Progressing Goal: Verbalization of understanding the information provided will improve Outcome: Progressing   Problem: Activity: Goal: Interest or engagement in activities will improve Outcome: Progressing Goal: Sleeping patterns will improve Outcome: Progressing

## 2024-12-26 NOTE — [facility] Counselor (Signed)
 CSW contacted pt's social worker, [name], [phone] about visiting pt to discuss placement process as pt has told provider multiple times she does NOT understand why she is still here.   CSW awaiting response at this time.   Lum Croft, MSW, [place] 12/26/2024 3:21 PM

## 2024-12-26 NOTE — Progress Notes (Signed)
 [name] Progress Note  Patient is a 69 year old female with a history of Schizoaffective Disorder, Bipolar Type who presents via [facility] under IVC, initiated by CM, Shari [name] with re-entry program, to [name] for assessment. Per IVC, [name] has been diagnosed with Schizophrenia and is refusing to take medication. [name] isn't sleeping. Residents report her screaming and yelling all night long.  [name] responding to internal stimuli.  [name] verbally aggressive.  The respondent has locked all of the residents inside of the transitional housing home.  They could not leave the home which prompted the IVC.  Residents did not have access to food, the restroom or anything as a result of being locked in.  [name] is currently under Adult protective services care. Patient is admitted to [name] unit with Q15 min safety monitoring. Multidisciplinary team approach is offered. Medication management; group/milieu therapy is offered.   Subjective:  Chart reviewed, case discussed in multidisciplinary meeting, patient seen during rounds.    Patient is noted to be resting in bed.  She denies having any physical complaints.  She denies SI/HI/plan and denies hallucinations.  She expressed her frustration being in the hospital for so long and needing to go back to his house.  She continues to lack capacity to process the information on the recent decision of getting her a legal guardian and APS looking for appropriate placement.  Per nursing patient has no behavioral problems and participates in groups, taking her medications with no reported side effects.   The patient remains psychiatrically stable at this time. [name] awaiting placement, and continued inpatient care is medically necessary primarily due to disposition considerations rather than acute psychiatric decompensation.   Psychiatric History: see h&P Family History:  Family History  Problem Relation Age of Onset   Diabetes  Mother    CAD Mother    Lung cancer Father    Social History:  Social History   Substance and Sexual Activity  Alcohol Use Yes   Comment: beer occasionally     Social History   Substance and Sexual Activity  Drug Use No    Social History   Socioeconomic History   Marital status: Widowed    Spouse name: Not on file   Number of children: Not on file   Years of education: Not on file   Highest education level: Not on file  Occupational History   Not on file  Tobacco Use   Smoking status: Every Day    Current packs/day: 0.50    Average packs/day: 0.5 packs/day for 40.0 years (20.0 ttl pk-yrs)    Types: Cigarettes   Smokeless tobacco: Never  Vaping Use   Vaping status: Never Used  Substance and Sexual Activity   Alcohol use: Yes    Comment: beer occasionally   Drug use: No   Sexual activity: Not Currently    Birth control/protection: Post-menopausal  Other Topics Concern   Not on file  Social History Narrative   Not on file   Social Drivers of Health   Tobacco Use: High Risk (11/02/2024)   Patient History    Smoking Tobacco Use: Every Day    Smokeless Tobacco Use: Never    Passive Exposure: Not on file  Financial Resource Strain: Not on file  Food Insecurity: No Food Insecurity (10/17/2024)   Epic    Worried About Programme Researcher, Broadcasting/film/video in the Last Year: Never true    Ran Out of Food in the Last Year: Never  true  Transportation Needs: No Transportation Needs (10/17/2024)   Epic    Lack of Transportation (Medical): No    Lack of Transportation (Non-Medical): No  Recent Concern: Transportation Needs - Unmet Transportation Needs (10/14/2024)   Epic    Lack of Transportation (Medical): Yes    Lack of Transportation (Non-Medical): Yes  Physical Activity: Not on file  Stress: Not on file  Social Connections: Unknown (10/17/2024)   Social Connection and Isolation Panel    Frequency of Communication with Friends and Family: Patient unable to answer    Frequency  of Social Gatherings with Friends and Family: Patient unable to answer    Attends Religious Services: Patient unable to answer    Active Member of Clubs or Organizations: Patient declined    Attends Banker Meetings: Patient unable to answer    Marital Status: Widowed  Depression (PHQ2-9): Not on file  Alcohol Screen: Low Risk (10/17/2024)   Alcohol Screen    Last Alcohol Screening Score (AUDIT): 0  Housing: Low Risk (10/17/2024)   Epic    Unable to Pay for Housing in the Last Year: No    Number of Times Moved in the Last Year: 0    Homeless in the Last Year: No  Utilities: Not At Risk (10/17/2024)   Epic    Threatened with loss of utilities: No  Health Literacy: Not on file   Past Medical History:  Past Medical History:  Diagnosis Date   Eye globe prosthesis    GERD (gastroesophageal reflux disease)    History of blood transfusion 1973   related to abscess burst in my stomach   Hyperlipemia    Hyperlipidemia    Hypertension    Osteoarthritis of back    Lowerback    SVD (spontaneous vaginal delivery)    x 1, baby died at 2 wks of age   Type II diabetes mellitus (HCC)     Past Surgical History:  Procedure Laterality Date   APPENDECTOMY     COLONOSCOPY     DILATATION & CURETTAGE/HYSTEROSCOPY WITH MYOSURE N/A 02/25/2015   Procedure: DILATATION & CURETTAGE/HYSTEROSCOPY WITH MYOSURE;  Surgeon: Truman Corona, MD;  Location: [facility];  Service: Gynecology;  Laterality: N/A;   DILATION AND CURETTAGE OF UTERUS     ENUCLEATION Right 03/16/2017   ENUCLEATION Right 03/16/2017   Procedure: ENUCLEATION RIGHT EYE;  Surgeon: Loyd Kathryne Palm, MD;  Location: [facility] OR;  Service: Ophthalmology;  Laterality: Right;   EYE SURGERY     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6, 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   EYE SURGERY Right ~ 1974   S/P initial eye injury; scissors stuck in my eye   LAPAROSCOPIC CHOLECYSTECTOMY     SHOULDER ARTHROSCOPY WITH ROTATOR CUFF REPAIR Left    wire stitches per patient    SHOULDER ARTHROSCOPY WITH ROTATOR CUFF REPAIR Right     Current Medications: Current Facility-Administered Medications  Medication Dose Route Frequency Provider Last Rate Last Admin   acetaminophen  (TYLENOL ) tablet 650 mg  650 mg Oral Q6H PRN Tex Drilling, NP   650 mg at 12/07/24 2126   alum & mag hydroxide-simeth (MAALOX/MYLANTA) 200-200-20 MG/5ML suspension 30 mL  30 mL Oral Q4H PRN Tex Drilling, NP       cloNIDine  (CATAPRES ) tablet 0.1 mg  0.1 mg Oral BID PRN Tex Drilling, NP       divalproex  (DEPAKOTE ) DR tablet 1,500 mg  1,500 mg Oral QHS Emil Klassen, MD   1,500 mg at 12/07/24 2126  divalproex  (DEPAKOTE ) DR tablet 500 mg  500 mg Oral q AM Kenzy Campoverde, MD   500 mg at 12/08/24 9192   losartan  (COZAAR ) tablet 25 mg  25 mg Oral Daily Nkwenti, Doris, NP   25 mg at 12/08/24 9071   magnesium  hydroxide (MILK OF MAGNESIA) suspension 30 mL  30 mL Oral Daily PRN Tex Drilling, NP       menthol  (CEPACOL) lozenge 3 mg  1 lozenge Oral PRN Bobbitt, Shalon E, NP   3 mg at 12/08/24 9065   methimazole  (TAPAZOLE ) tablet 10 mg  10 mg Oral Daily Nkwenti, Doris, NP   10 mg at 12/08/24 9071   OLANZapine  (ZYPREXA ) injection 5 mg  5 mg Intramuscular TID PRN Tex Drilling, NP       OLANZapine  (ZYPREXA ) injection 5 mg  5 mg Intramuscular TID PRN Tex Drilling, NP       OLANZapine  (ZYPREXA ) tablet 20 mg  20 mg Oral QHS Jaquay Posthumus, MD   20 mg at 12/07/24 2127   OLANZapine  zydis (ZYPREXA ) disintegrating tablet 5 mg  5 mg Oral TID PRN Tex Drilling, NP       OLANZapine  zydis (ZYPREXA ) disintegrating tablet 5 mg  5 mg Oral TID PRN Tex Drilling, NP       paliperidone  (INVEGA ) 24 hr tablet 3 mg  3 mg Oral Daily Shrivastava, Aryendra, MD   3 mg at 12/08/24 9071   pantoprazole  (PROTONIX ) EC tablet 40 mg  40 mg Oral Daily Nkwenti, Doris, NP   40 mg at 12/08/24 9071   traZODone  (DESYREL ) tablet 50 mg  50 mg Oral QHS PRN Tex Drilling, NP   50 mg at 12/06/24 2109    Lab Results:  No results found  for this or any previous visit (from the past 48 hours).       Blood Alcohol level:  Lab Results  Component Value Date   [facility] <15 10/14/2024   ETH <10 07/02/2022    Metabolic Disorder Labs: Lab Results  Component Value Date   HGBA1C 5.5 10/14/2024   MPG 111.15 10/14/2024   MPG 137 01/06/2022   Lab Results  Component Value Date   PROLACTIN 5.8 10/14/2024   Lab Results  Component Value Date   CHOL 167 10/14/2024   TRIG 68 10/14/2024   HDL 60 10/14/2024   CHOLHDL 2.8 10/14/2024   VLDL 14 10/14/2024   LDLCALC 93 10/14/2024   LDLCALC 73 03/13/2024    Physical Findings: AIMS:  , ,  ,  ,    CIWA:    COWS:      Psychiatric Specialty Exam:  Presentation  General Appearance:  Appropriate for Environment  Eye Contact: Fleeting  Speech: Normal Rate  Speech Volume: Decreased    Mood and Affect  Mood:fine  Affect: Flat   Thought Process  Thought Processes: impoverished  Orientation:Partial  Thought Content: Chronic delusions Hallucinations: Denies  Ideas of Reference:Delusions; Paranoia  Suicidal Thoughts: Denies  Homicidal Thoughts: Denies   Sensorium  Memory: Immediate Fair; Recent Fair  Judgment: Impaired  Insight: Shallow   Executive Functions  Concentration: Fair  Attention Span: Fair  Recall: Poor  Fund of Knowledge: Fair  Language: Fair   Psychomotor Activity  Psychomotor Activity: No data recorded  Musculoskeletal: Strength & Muscle Tone: within normal limits Gait & Station: normal Assets  Assets: Manufacturing Systems Engineer; Desire for Improvement; Social Support    Physical Exam: Physical Exam Vitals and nursing note reviewed.    ROS Blood pressure (!) 152/69, pulse  98, temperature 98.3 F (36.8 C), resp. rate 18, height 5' 8 (1.727 m), weight 48.3 kg, SpO2 100%. Body mass index is 16.19 kg/m.  Diagnosis: Principal Problem:   Schizophrenia, paranoid (HCC) Major neurocognitive disorder-slums  total score of 13/30   Treatment Plan Summary: APS referral has been made as patient lacks capacity to make medical decisions.  Recommending legal guardianship and further placement at appropriate level of care  Safety and Monitoring:             -- Involuntary admission to inpatient psychiatric unit for safety, stabilization and treatment             -- Daily contact with patient to assess and evaluate symptoms and progress in treatment             -- Patient's case to be discussed in multi-disciplinary team meeting             -- Observation Level: q15 minute checks             -- Vital signs:  q12 hours             -- Precautions: suicide, elopement, and assault   2. Psychiatric Diagnoses and Treatment:  Check Depakote  levels 11/20/2024-60 Invega  at 3 mg increased to 6 mg           Depakote  500 mg every morning and 1500 mg   Zyprexa  20 mg nightly  -- The risks/benefits/side-effects/alternatives to this medication were discussed in detail with the patient and time was given for questions. The patient consents to medication trial.                -- Metabolic profile and EKG monitoring obtained while on an atypical antipsychotic (BMI: Lipid Panel: HbgA1c: QTc:)              -- Encouraged patient to participate in unit milieu and in scheduled group therapies   Occupational Therapy recommendations If plan is discharge home, recommend the following:   Direct supervision/assist for medications management;Direct supervision/assist for financial management;Assist for transportation;Assistance with cooking/housework    Ms Troutman was seen for OT treatment on this date. Upon arrival to room pt in dayroom, agreeable to tx. The SLUMS is a 30-point screening questionnaire that tests orientation, memory, attention, problem solving, and executive function. Pt scored a 13/30 indicating Dementia. Of note, it is not within occupational therapy scope of practice to diagnose cognitive impairments, this  screen indicates need for further testing. Pt with noted impairments in memory and problem solving limiting ability to participate functionally in medication management, bill management, and safety cooking. Will continue to follow POC. Continue to recommend SUPERVISION for safety with iADLs.    [facility] Mental Status Examination Orientation: 2/3  Calculations: 0/3 Naming animals: 2/3 Patient named 14 animals (0 points is 0-4 animals; 1 is 5-9 animals; 2 is 10-14 animals; 3 is 15+ animals) Recall: 3/5  Attention: 1/2 Clock drawing: 1/4 Visual Processing: 2/2 Paragraph Memory: 2/8  Total: 13/30;  Given that patient has completed high school, this score falls in the dementia range.   4. Discharge Planning:   -- Social work and case management to assist with discharge planning and identification of hospital follow-up needs prior to discharge  -- Estimated LOS: 3-4 days  Shari JONELLE Manners, MD 12/08/2024, 5:46 PM

## 2024-12-26 NOTE — Group Note (Signed)
 Date:  12/26/2024 Time:  10:45 AM  Group Topic/Focus:   Effective exercises for geriatric patients focus on improving balance, strength, flexibility, and aerobic fitness, crucial for preventing falls and maintaining independence; key activities include walking, chair yoga/stretching, strength training with bands or light weights (like calf raises, wall push-ups, sit-to-stands), and balance drills (like single-leg stands and heel-to-toe walking), always with a doctor's approval.    Participation Level:  Did Not Attend   Shari [name] 12/26/2024, 10:45 AM

## 2024-12-26 NOTE — Group Note (Signed)
 Recreation Therapy Group Note   Group Topic:Coping Skills  Group Date: 12/26/2024 Start Time: 1400 End Time: 1430 Facilitators: Celestia Jeoffrey BRAVO, LRT, CTRS Location: Dayroom  Group Description: Mind Map.  Patient was provided a blank template of a diagram with 32 blank boxes in a tiered system, branching from the center (similar to a bubble chart). LRT directed patients to label the middle of the diagram Coping Skills. LRT and patients then came up with 8 different coping skills as examples. Pt were directed to record their coping skills in the 2nd tier boxes closest to the center.  Patients would then share their coping skills with the group as LRT wrote them out. LRT gave a handout of 99 different coping skills at the end of group.   Goal Area(s) Addressed: Patients will be able to define coping skills. Patient will identify new coping skills.  Patient will increase communication.   Affect/Mood: N/A   Participation Level: Did not attend    Clinical Observations/Individualized Feedback: Patient did not attend.  Plan: Continue to engage patient in RT group sessions 2-3x/week.   Jeoffrey BRAVO Celestia, LRT, CTRS 12/26/2024 4:21 PM

## 2024-12-27 DIAGNOSIS — F0390 Unspecified dementia without behavioral disturbance: Secondary | ICD-10-CM | POA: Diagnosis not present

## 2024-12-27 DIAGNOSIS — F200 Paranoid schizophrenia: Secondary | ICD-10-CM | POA: Diagnosis not present

## 2024-12-27 NOTE — Progress Notes (Signed)
 Behavior:  Pleasant and cooperative.  Present in the milieu.     Psych assessment: Denies SI/HI and AVH.  Group attendance:  2/2  Medication/ PRNs:  Compliant.  Pain:  5/10 in back  15 min checks in place for safety.

## 2024-12-27 NOTE — Progress Notes (Signed)
" °   12/27/24 1957  Psych Admission Type (Psych Patients Only)  Admission Status Voluntary  Psychosocial Assessment  Patient Complaints None  Eye Contact Fair  Facial Expression Flat  Affect Flat  Speech Logical/coherent  Interaction Minimal  Motor Activity Slow  Appearance/Hygiene Unremarkable  Behavior Characteristics Cooperative;Appropriate to situation  Mood Pleasant  Thought Process  Coherency WDL  Content WDL  Delusions None reported or observed  Perception WDL  Hallucination None reported or observed  Judgment WDL  Confusion None  Danger to Self  Current suicidal ideation? Denies  Danger to Others  Danger to Others None reported or observed   Mood/Behavior:  Pleasant and cooperative.    Psych assessment: Denies SI/HI and AVH.     Interaction / Group attendance:  Present in the milieu. Minimal interaction with peers and staff.  Attended group.   Medication/ PRNs: Compliant with scheduled medications. Required PRNs Trazodone  for sleep and noted effective. Pt required prn lozenge noted effective. PRN Tylenol  for shoulder pain noted effective.   Pain: Shoulder pain   15 min checks in place for safety. "

## 2024-12-27 NOTE — Group Note (Signed)
 Date:  12/27/2024 Time:  10:09 PM  Group Topic/Focus:  Goals Group:   The focus of this group is to help patients establish daily goals to achieve during treatment and discuss how the patient can incorporate goal setting into their daily lives to aide in recovery.    Participation Level:  Active  Participation Quality:  Appropriate  Affect:  Appropriate  Cognitive:  Appropriate  Insight: Appropriate  Engagement in Group:  Engaged  Modes of Intervention:  Discussion  Additional Comments:    Romero Earnie Hope 12/27/2024, 10:09 PM

## 2024-12-27 NOTE — Progress Notes (Signed)
 [facility] MD Progress Note  Patient is a 69 year old female with a history of Schizoaffective Disorder, Bipolar Type who presents via [facility] under IVC, initiated by CM, Falencio with re-entry program, to [facility] for assessment. Per IVC, [name] has been diagnosed with Schizophrenia and is refusing to take medication. [name] isn't sleeping. Residents report her screaming and yelling all night long.  [name] responding to internal stimuli.  [name] verbally aggressive.  The respondent has locked all of the residents inside of the transitional housing home.  They could not leave the home which prompted the IVC.  Residents did not have access to food, the restroom or anything as a result of being locked in.  [name] is currently under Adult protective services care. Patient is admitted to [facility] unit with Q15 min safety monitoring. Multidisciplinary team approach is offered. Medication management; group/milieu therapy is offered.   Subjective:  Chart reviewed, case discussed in multidisciplinary meeting, patient seen during rounds.    Patient is noted to be resting in bed.  She offers no complaints.  She remains frustrated about her living situation and remains discharge focused.  [name] unable to process the information related to her legal guardianship and APS looking for new placement.  Per nursing patient remains calm and cooperative, attending the groups, compliant with her medications on the unit  The patient remains psychiatrically stable at this time. [name] awaiting placement, and continued inpatient care is medically necessary primarily due to disposition considerations rather than acute psychiatric decompensation.   Psychiatric History: see h&P Family History:  Family History  Problem Relation Age of Onset   Diabetes Mother    CAD Mother    Lung cancer Father    Social History:  Social History   Substance and Sexual Activity  Alcohol Use Yes   Comment: beer  occasionally     Social History   Substance and Sexual Activity  Drug Use No    Social History   Socioeconomic History   Marital status: Widowed    Spouse name: Not on file   Number of children: Not on file   Years of education: Not on file   Highest education level: Not on file  Occupational History   Not on file  Tobacco Use   Smoking status: Every Day    Current packs/day: 0.50    Average packs/day: 0.5 packs/day for 40.0 years (20.0 ttl pk-yrs)    Types: Cigarettes   Smokeless tobacco: Never  Vaping Use   Vaping status: Never Used  Substance and Sexual Activity   Alcohol use: Yes    Comment: beer occasionally   Drug use: No   Sexual activity: Not Currently    Birth control/protection: Post-menopausal  Other Topics Concern   Not on file  Social History Narrative   Not on file   Social Drivers of Health   Tobacco Use: High Risk (11/02/2024)   Patient History    Smoking Tobacco Use: Every Day    Smokeless Tobacco Use: Never    Passive Exposure: Not on file  Financial Resource Strain: Not on file  Food Insecurity: No Food Insecurity (10/17/2024)   Epic    Worried About Programme Researcher, Broadcasting/film/video in the Last Year: Never true    Ran Out of Food in the Last Year: Never true  Transportation Needs: No Transportation Needs (10/17/2024)   Epic    Lack of Transportation (Medical): No    Lack of Transportation (Non-Medical): No  Recent Concern: Transportation Needs - Unmet Transportation Needs (10/14/2024)   Epic    Lack of Transportation (Medical): Yes    Lack of Transportation (Non-Medical): Yes  Physical Activity: Not on file  Stress: Not on file  Social Connections: Unknown (10/17/2024)   Social Connection and Isolation Panel    Frequency of Communication with Friends and Family: Patient unable to answer    Frequency of Social Gatherings with Friends and Family: Patient unable to answer    Attends Religious Services: Patient unable to answer    Active Member of  Clubs or Organizations: Patient declined    Attends Banker Meetings: Patient unable to answer    Marital Status: Widowed  Depression (PHQ2-9): Not on file  Alcohol Screen: Low Risk (10/17/2024)   Alcohol Screen    Last Alcohol Screening Score (AUDIT): 0  Housing: Low Risk (10/17/2024)   Epic    Unable to Pay for Housing in the Last Year: No    Number of Times Moved in the Last Year: 0    Homeless in the Last Year: No  Utilities: Not At Risk (10/17/2024)   Epic    Threatened with loss of utilities: No  Health Literacy: Not on file   Past Medical History:  Past Medical History:  Diagnosis Date   Eye globe prosthesis    GERD (gastroesophageal reflux disease)    History of blood transfusion 1973   related to abscess burst in my stomach   Hyperlipemia    Hyperlipidemia    Hypertension    Osteoarthritis of back    Lowerback    SVD (spontaneous vaginal delivery)    x 1, baby died at 2 wks of age   Type II diabetes mellitus (HCC)     Past Surgical History:  Procedure Laterality Date   APPENDECTOMY     COLONOSCOPY     DILATATION & CURETTAGE/HYSTEROSCOPY WITH MYOSURE N/A 02/25/2015   Procedure: DILATATION & CURETTAGE/HYSTEROSCOPY WITH MYOSURE;  Surgeon: Truman Corona, MD;  Location: [facility];  Service: Gynecology;  Laterality: N/A;   DILATION AND CURETTAGE OF UTERUS     ENUCLEATION Right 03/16/2017   ENUCLEATION Right 03/16/2017   Procedure: ENUCLEATION RIGHT EYE;  Surgeon: Loyd Kathryne Palm, MD;  Location: [facility] OR;  Service: Ophthalmology;  Laterality: Right;   EYE SURGERY     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6, 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   EYE SURGERY Right ~ 1974   S/P initial eye injury; scissors stuck in my eye   LAPAROSCOPIC CHOLECYSTECTOMY     SHOULDER ARTHROSCOPY WITH ROTATOR CUFF REPAIR Left    wire stitches per patient   SHOULDER ARTHROSCOPY WITH ROTATOR CUFF REPAIR Right     Current Medications: Current Facility-Administered Medications  Medication Dose Route  Frequency Provider Last Rate Last Admin   acetaminophen  (TYLENOL ) tablet 650 mg  650 mg Oral Q6H PRN Tex Drilling, NP   650 mg at 12/07/24 2126   alum & mag hydroxide-simeth (MAALOX/MYLANTA) 200-200-20 MG/5ML suspension 30 mL  30 mL Oral Q4H PRN Nkwenti, Drilling, NP       cloNIDine  (CATAPRES ) tablet 0.1 mg  0.1 mg Oral BID PRN Tex Drilling, NP       divalproex  (DEPAKOTE ) DR tablet 1,500 mg  1,500 mg Oral QHS Valori Hollenkamp, MD   1,500 mg at 12/07/24 2126   divalproex  (DEPAKOTE ) DR tablet 500 mg  500 mg Oral q AM Kendal Raffo, MD   500 mg at 12/08/24 0807   losartan  (COZAAR ) tablet  25 mg  25 mg Oral Daily Tex Drilling, NP   25 mg at 12/08/24 0928   magnesium  hydroxide (MILK OF MAGNESIA) suspension 30 mL  30 mL Oral Daily PRN Tex Drilling, NP       menthol  (CEPACOL) lozenge 3 mg  1 lozenge Oral PRN Bobbitt, Shalon E, NP   3 mg at 12/08/24 0934   methimazole  (TAPAZOLE ) tablet 10 mg  10 mg Oral Daily Nkwenti, Doris, NP   10 mg at 12/08/24 9071   OLANZapine  (ZYPREXA ) injection 5 mg  5 mg Intramuscular TID PRN Tex Drilling, NP       OLANZapine  (ZYPREXA ) injection 5 mg  5 mg Intramuscular TID PRN Tex Drilling, NP       OLANZapine  (ZYPREXA ) tablet 20 mg  20 mg Oral QHS Naftuli Dalsanto, MD   20 mg at 12/07/24 2127   OLANZapine  zydis (ZYPREXA ) disintegrating tablet 5 mg  5 mg Oral TID PRN Tex Drilling, NP       OLANZapine  zydis (ZYPREXA ) disintegrating tablet 5 mg  5 mg Oral TID PRN Tex Drilling, NP       paliperidone  (INVEGA ) 24 hr tablet 3 mg  3 mg Oral Daily Shrivastava, Aryendra, MD   3 mg at 12/08/24 9071   pantoprazole  (PROTONIX ) EC tablet 40 mg  40 mg Oral Daily Nkwenti, Doris, NP   40 mg at 12/08/24 9071   traZODone  (DESYREL ) tablet 50 mg  50 mg Oral QHS PRN Tex Drilling, NP   50 mg at 12/06/24 2109    Lab Results:  No results found for this or any previous visit (from the past 48 hours).       Blood Alcohol level:  Lab Results  Component Value Date   [facility] <15  10/14/2024   ETH <10 07/02/2022    Metabolic Disorder Labs: Lab Results  Component Value Date   HGBA1C 5.5 10/14/2024   MPG 111.15 10/14/2024   MPG 137 01/06/2022   Lab Results  Component Value Date   PROLACTIN 5.8 10/14/2024   Lab Results  Component Value Date   CHOL 167 10/14/2024   TRIG 68 10/14/2024   HDL 60 10/14/2024   CHOLHDL 2.8 10/14/2024   VLDL 14 10/14/2024   LDLCALC 93 10/14/2024   LDLCALC 73 03/13/2024    Physical Findings: AIMS:  , ,  ,  ,    CIWA:    COWS:      Psychiatric Specialty Exam:  Presentation  General Appearance:  Appropriate for Environment  Eye Contact: Fleeting  Speech: Normal Rate  Speech Volume: Decreased    Mood and Affect  Mood:fine  Affect: Flat   Thought Process  Thought Processes: impoverished  Orientation:Partial  Thought Content: Chronic delusions Hallucinations: Denies  Ideas of Reference:Delusions; Paranoia  Suicidal Thoughts: Denies  Homicidal Thoughts: Denies   Sensorium  Memory: Immediate Fair; Recent Fair  Judgment: Impaired  Insight: Shallow   Executive Functions  Concentration: Fair  Attention Span: Fair  Recall: Poor  Fund of Knowledge: Fair  Language: Fair   Psychomotor Activity  Psychomotor Activity: No data recorded  Musculoskeletal: Strength & Muscle Tone: within normal limits Gait & Station: normal Assets  Assets: Manufacturing Systems Engineer; Desire for Improvement; Social Support    Physical Exam: Physical Exam Vitals and nursing note reviewed.    ROS Blood pressure (!) 152/69, pulse 98, temperature 98.3 F (36.8 C), resp. rate 18, height 5' 8 (1.727 m), weight 48.3 kg, SpO2 100%. Body mass index is 16.19 kg/m.  Diagnosis:  Principal Problem:   Schizophrenia, paranoid (HCC) Major neurocognitive disorder-slums total score of 13/30   Treatment Plan Summary: APS referral has been made as patient lacks capacity to make medical decisions.   Recommending legal guardianship and further placement at appropriate level of care  Safety and Monitoring:             -- Involuntary admission to inpatient psychiatric unit for safety, stabilization and treatment             -- Daily contact with patient to assess and evaluate symptoms and progress in treatment             -- Patient's case to be discussed in multi-disciplinary team meeting             -- Observation Level: q15 minute checks             -- Vital signs:  q12 hours             -- Precautions: suicide, elopement, and assault   2. Psychiatric Diagnoses and Treatment:  Check Depakote  levels 11/20/2024-60 Invega  at 3 mg increased to 6 mg           Depakote  500 mg every morning and 1500 mg   Zyprexa  20 mg nightly  -- The risks/benefits/side-effects/alternatives to this medication were discussed in detail with the patient and time was given for questions. The patient consents to medication trial.                -- Metabolic profile and EKG monitoring obtained while on an atypical antipsychotic (BMI: Lipid Panel: HbgA1c: QTc:)              -- Encouraged patient to participate in unit milieu and in scheduled group therapies   Occupational Therapy recommendations If plan is discharge home, recommend the following:   Direct supervision/assist for medications management;Direct supervision/assist for financial management;Assist for transportation;Assistance with cooking/housework    Ms Hitzeman was seen for OT treatment on this date. Upon arrival to room pt in dayroom, agreeable to tx. The SLUMS is a 30-point screening questionnaire that tests orientation, memory, attention, problem solving, and executive function. Pt scored a 13/30 indicating Dementia. Of note, it is not within occupational therapy scope of practice to diagnose cognitive impairments, this screen indicates need for further testing. Pt with noted impairments in memory and problem solving limiting ability to participate  functionally in medication management, bill management, and safety cooking. Will continue to follow POC. Continue to recommend SUPERVISION for safety with iADLs.    [facility] Mental Status Examination Orientation: 2/3  Calculations: 0/3 Naming animals: 2/3 Patient named 14 animals (0 points is 0-4 animals; 1 is 5-9 animals; 2 is 10-14 animals; 3 is 15+ animals) Recall: 3/5  Attention: 1/2 Clock drawing: 1/4 Visual Processing: 2/2 Paragraph Memory: 2/8  Total: 13/30;  Given that patient has completed high school, this score falls in the dementia range.   4. Discharge Planning:   -- Social work and case management to assist with discharge planning and identification of hospital follow-up needs prior to discharge  -- Estimated LOS: 3-4 days  Millie JONELLE Manners, MD 12/08/2024, 5:46 PM

## 2024-12-27 NOTE — Group Note (Signed)
 Recreation Therapy Group Note   Group Topic:Healthy Support Systems  Group Date: 12/27/2024 Start Time: 1400 End Time: 1445 Facilitators: Celestia Jeoffrey BRAVO, LRT, CTRS Location: Dayroom  Group Description: Straw Bridge. In groups or individually, patients were given 10 plastic drinking straws and an equal length of masking tape. Using the materials provided, patients were instructed to build a free-standing bridge-like structure to suspend an everyday item (ex: deck of cards) off the floor or table surface. All materials were required to be used in secondary school teacher. LRT facilitated post-activity discussion reviewing the importance of having strong and healthy support systems in our lives. LRT discussed how the people in our lives serve as the tape and the deck of cards we placed on top of our straw structure are the stressors we face in daily life. LRT and pts discussed what happens in our life when things get too heavy for us , and we don't have strong supports outside of the hospital. Pt shared 2 of their healthy supports in their life aloud in the group.   Goal Area(s) Addressed:  Patient will identify 2 healthy supports in their life. Patient will identify skills to successfully complete activity. Patient will identify correlation of this activity to life post-discharge.  Patient will build on frustration tolerance skills. Patient will increase team building and communication skills.    Affect/Mood: Appropriate   Participation Level: Non-verbal    Clinical Observations/Individualized Feedback: Shari [name] was present in the dayroom during group. Pt did not complete a structure or interact while present.   Plan: Continue to engage patient in RT group sessions 2-3x/week.   Jeoffrey BRAVO Celestia, LRT, CTRS 12/27/2024 4:29 PM

## 2024-12-27 NOTE — Group Note (Signed)
 Date:  12/27/2024 Time:  11:04 AM  Group Topic/Focus:  Coping With Mental Health Crisis:   The purpose of this group is to help patients identify strategies for coping with mental health crisis.  Group discusses possible causes of crisis and ways to manage them effectively.    Participation Level:  Active  Participation Quality:  Appropriate  Affect:  Appropriate  Cognitive:  Appropriate  Insight: Appropriate  Engagement in Group:  Engaged  Modes of Intervention:  Discussion   Shari [name] 12/27/2024, 11:04 AM

## 2024-12-27 NOTE — Plan of Care (Signed)

## 2024-12-27 NOTE — Plan of Care (Signed)
  Problem: Education: Goal: Emotional status will improve Outcome: Progressing   Problem: Activity: Goal: Interest or engagement in activities will improve Outcome: Progressing   Problem: Coping: Goal: Ability to demonstrate self-control will improve Outcome: Progressing

## 2024-12-28 DIAGNOSIS — F0390 Unspecified dementia without behavioral disturbance: Secondary | ICD-10-CM | POA: Diagnosis not present

## 2024-12-28 DIAGNOSIS — F200 Paranoid schizophrenia: Secondary | ICD-10-CM | POA: Diagnosis not present

## 2024-12-28 NOTE — Group Note (Signed)
 Date:  12/28/2024 Time:  4:19 PM  Group Topic/Focus:  Coping With Mental Health Crisis:   The purpose of this group is to help patients identify strategies for coping with mental health crisis.  Group discusses possible causes of crisis and ways to manage them effectively.  Today I facilitated a group activity that encouraged patient participation through a Family Feud-style game. Patients answered questions focused on enjoyable activities, hobbies, and places they like to go for fun. This activity promoted discussion about healthy coping mechanisms, leisure skills, and positive habits. Patients were encouraged to reflect on fun moments in their lives, with several emphasizing holidays such as Thanksgiving and Christmas, particularly in relation to cooking and spending time with family. Patients were engaged, cooperative, and actively participated throughout the group.    Participation Level:  Minimal  Participation Quality:  Appropriate  Affect:  Appropriate  Cognitive:  Appropriate  Insight: Appropriate  Engagement in Group:  Engaged  Modes of Intervention:  Activity and Discussion  Additional Comments:    Abir Eroh L Shaughnessy Gethers 12/28/2024, 4:19 PM

## 2024-12-28 NOTE — [facility] Counselor (Signed)
 CSW received confirmation that Shari [name], APS caseworker will be visiting pt today to speak with her. CSW referred to social workers on [facility] as CSW is currently working at [facility].   Csw will receive handoff about visit from social worker present during the visit.   Lum Croft, MSW, [place] 12/28/2024 11:20 AM

## 2024-12-28 NOTE — Progress Notes (Signed)
 [facility] MD Progress Note  Patient is a 69 year old female with a history of Schizoaffective Disorder, Bipolar Type who presents via [facility] under IVC, initiated by CM, Falencio with re-entry program, to [facility] for assessment. Per IVC, [name] has been diagnosed with Schizophrenia and is refusing to take medication. [name] isn't sleeping. Residents report her screaming and yelling all night long.  [name] responding to internal stimuli.  [name] verbally aggressive.  The respondent has locked all of the residents inside of the transitional housing home.  They could not leave the home which prompted the IVC.  Residents did not have access to food, the restroom or anything as a result of being locked in.  [name] is currently under Adult protective services care. Patient is admitted to [facility] unit with Q15 min safety monitoring. Multidisciplinary team approach is offered. Medication management; group/milieu therapy is offered.   Subjective:  Chart reviewed, case discussed in multidisciplinary meeting, patient seen during rounds.   Patient is noted to be resting in bed.  She reports feeling cold and tired today.  She offers no other physical complaints and denies current SI/HI/plan.  Patient denies hallucinations.  Per nursing patient remains calm and cooperative, fair appetite and sleep and participating in groups.  The patient remains psychiatrically stable at this time. [name] awaiting placement, and continued inpatient care is medically necessary primarily due to disposition considerations rather than acute psychiatric decompensation.   Psychiatric History: see h&P Family History:  Family History  Problem Relation Age of Onset   Diabetes Mother    CAD Mother    Lung cancer Father    Social History:  Social History   Substance and Sexual Activity  Alcohol Use Yes   Comment: beer occasionally     Social History   Substance and Sexual Activity  Drug Use No     Social History   Socioeconomic History   Marital status: Widowed    Spouse name: Not on file   Number of children: Not on file   Years of education: Not on file   Highest education level: Not on file  Occupational History   Not on file  Tobacco Use   Smoking status: Every Day    Current packs/day: 0.50    Average packs/day: 0.5 packs/day for 40.0 years (20.0 ttl pk-yrs)    Types: Cigarettes   Smokeless tobacco: Never  Vaping Use   Vaping status: Never Used  Substance and Sexual Activity   Alcohol use: Yes    Comment: beer occasionally   Drug use: No   Sexual activity: Not Currently    Birth control/protection: Post-menopausal  Other Topics Concern   Not on file  Social History Narrative   Not on file   Social Drivers of Health   Tobacco Use: High Risk (11/02/2024)   Patient History    Smoking Tobacco Use: Every Day    Smokeless Tobacco Use: Never    Passive Exposure: Not on file  Financial Resource Strain: Not on file  Food Insecurity: No Food Insecurity (10/17/2024)   Epic    Worried About Programme Researcher, Broadcasting/film/video in the Last Year: Never true    Ran Out of Food in the Last Year: Never true  Transportation Needs: No Transportation Needs (10/17/2024)   Epic    Lack of Transportation (Medical): No    Lack of Transportation (Non-Medical): No  Recent Concern: Transportation Needs - Unmet Transportation Needs (10/14/2024)   Epic  Lack of Transportation (Medical): Yes    Lack of Transportation (Non-Medical): Yes  Physical Activity: Not on file  Stress: Not on file  Social Connections: Unknown (10/17/2024)   Social Connection and Isolation Panel    Frequency of Communication with Friends and Family: Patient unable to answer    Frequency of Social Gatherings with Friends and Family: Patient unable to answer    Attends Religious Services: Patient unable to answer    Active Member of Clubs or Organizations: Patient declined    Attends Banker Meetings:  Patient unable to answer    Marital Status: Widowed  Depression (PHQ2-9): Not on file  Alcohol Screen: Low Risk (10/17/2024)   Alcohol Screen    Last Alcohol Screening Score (AUDIT): 0  Housing: Low Risk (10/17/2024)   Epic    Unable to Pay for Housing in the Last Year: No    Number of Times Moved in the Last Year: 0    Homeless in the Last Year: No  Utilities: Not At Risk (10/17/2024)   Epic    Threatened with loss of utilities: No  Health Literacy: Not on file   Past Medical History:  Past Medical History:  Diagnosis Date   Eye globe prosthesis    GERD (gastroesophageal reflux disease)    History of blood transfusion 1973   related to abscess burst in my stomach   Hyperlipemia    Hyperlipidemia    Hypertension    Osteoarthritis of back    Lowerback    SVD (spontaneous vaginal delivery)    x 1, baby died at 2 wks of age   Type II diabetes mellitus (HCC)     Past Surgical History:  Procedure Laterality Date   APPENDECTOMY     COLONOSCOPY     DILATATION & CURETTAGE/HYSTEROSCOPY WITH MYOSURE N/A 02/25/2015   Procedure: DILATATION & CURETTAGE/HYSTEROSCOPY WITH MYOSURE;  Surgeon: Truman Corona, MD;  Location: [facility];  Service: Gynecology;  Laterality: N/A;   DILATION AND CURETTAGE OF UTERUS     ENUCLEATION Right 03/16/2017   ENUCLEATION Right 03/16/2017   Procedure: ENUCLEATION RIGHT EYE;  Surgeon: Loyd Kathryne Palm, MD;  Location: [facility] OR;  Service: Ophthalmology;  Laterality: Right;   EYE SURGERY     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6, 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   EYE SURGERY Right ~ 1974   S/P initial eye injury; scissors stuck in my eye   LAPAROSCOPIC CHOLECYSTECTOMY     SHOULDER ARTHROSCOPY WITH ROTATOR CUFF REPAIR Left    wire stitches per patient   SHOULDER ARTHROSCOPY WITH ROTATOR CUFF REPAIR Right     Current Medications: Current Facility-Administered Medications  Medication Dose Route Frequency Provider Last Rate Last Admin   acetaminophen  (TYLENOL ) tablet 650 mg  650 mg  Oral Q6H PRN Tex Drilling, NP   650 mg at 12/07/24 2126   alum & mag hydroxide-simeth (MAALOX/MYLANTA) 200-200-20 MG/5ML suspension 30 mL  30 mL Oral Q4H PRN Tex Drilling, NP       cloNIDine  (CATAPRES ) tablet 0.1 mg  0.1 mg Oral BID PRN Tex Drilling, NP       divalproex  (DEPAKOTE ) DR tablet 1,500 mg  1,500 mg Oral QHS Abdi Husak, MD   1,500 mg at 12/07/24 2126   divalproex  (DEPAKOTE ) DR tablet 500 mg  500 mg Oral q AM Tennis Mckinnon, MD   500 mg at 12/08/24 9192   losartan  (COZAAR ) tablet 25 mg  25 mg Oral Daily Nkwenti, Doris, NP   25 mg at  12/08/24 0928   magnesium  hydroxide (MILK OF MAGNESIA) suspension 30 mL  30 mL Oral Daily PRN Tex Drilling, NP       menthol  (CEPACOL) lozenge 3 mg  1 lozenge Oral PRN Bobbitt, Shalon E, NP   3 mg at 12/08/24 9065   methimazole  (TAPAZOLE ) tablet 10 mg  10 mg Oral Daily Nkwenti, Doris, NP   10 mg at 12/08/24 9071   OLANZapine  (ZYPREXA ) injection 5 mg  5 mg Intramuscular TID PRN Tex Drilling, NP       OLANZapine  (ZYPREXA ) injection 5 mg  5 mg Intramuscular TID PRN Tex Drilling, NP       OLANZapine  (ZYPREXA ) tablet 20 mg  20 mg Oral QHS Decoda Van, MD   20 mg at 12/07/24 2127   OLANZapine  zydis (ZYPREXA ) disintegrating tablet 5 mg  5 mg Oral TID PRN Tex Drilling, NP       OLANZapine  zydis (ZYPREXA ) disintegrating tablet 5 mg  5 mg Oral TID PRN Tex Drilling, NP       paliperidone  (INVEGA ) 24 hr tablet 3 mg  3 mg Oral Daily Shrivastava, Aryendra, MD   3 mg at 12/08/24 9071   pantoprazole  (PROTONIX ) EC tablet 40 mg  40 mg Oral Daily Nkwenti, Drilling, NP   40 mg at 12/08/24 9071   traZODone  (DESYREL ) tablet 50 mg  50 mg Oral QHS PRN Tex Drilling, NP   50 mg at 12/06/24 2109    Lab Results:  No results found for this or any previous visit (from the past 48 hours).       Blood Alcohol level:  Lab Results  Component Value Date   [facility] <15 10/14/2024   ETH <10 07/02/2022    Metabolic Disorder Labs: Lab Results  Component Value  Date   HGBA1C 5.5 10/14/2024   MPG 111.15 10/14/2024   MPG 137 01/06/2022   Lab Results  Component Value Date   PROLACTIN 5.8 10/14/2024   Lab Results  Component Value Date   CHOL 167 10/14/2024   TRIG 68 10/14/2024   HDL 60 10/14/2024   CHOLHDL 2.8 10/14/2024   VLDL 14 10/14/2024   LDLCALC 93 10/14/2024   LDLCALC 73 03/13/2024    Physical Findings: AIMS:  , ,  ,  ,    CIWA:    COWS:      Psychiatric Specialty Exam:  Presentation  General Appearance:  Appropriate for Environment  Eye Contact: Fleeting  Speech: Normal Rate  Speech Volume: Decreased    Mood and Affect  Mood:fine  Affect: Flat   Thought Process  Thought Processes: impoverished  Orientation:Partial  Thought Content: Chronic delusions Hallucinations: Denies  Ideas of Reference:Delusions; Paranoia  Suicidal Thoughts: Denies  Homicidal Thoughts: Denies   Sensorium  Memory: Immediate Fair; Recent Fair  Judgment: Impaired  Insight: Shallow   Executive Functions  Concentration: Fair  Attention Span: Fair  Recall: Poor  Fund of Knowledge: Fair  Language: Fair   Psychomotor Activity  Psychomotor Activity: No data recorded  Musculoskeletal: Strength & Muscle Tone: within normal limits Gait & Station: normal Assets  Assets: Manufacturing Systems Engineer; Desire for Improvement; Social Support    Physical Exam: Physical Exam Vitals and nursing note reviewed.    ROS Blood pressure (!) 152/69, pulse 98, temperature 98.3 F (36.8 C), resp. rate 18, height 5' 8 (1.727 m), weight 48.3 kg, SpO2 100%. Body mass index is 16.19 kg/m.  Diagnosis: Principal Problem:   Schizophrenia, paranoid (HCC) Major neurocognitive disorder-slums total score of 13/30  Treatment Plan Summary: APS referral has been made as patient lacks capacity to make medical decisions.  Recommending legal guardianship and further placement at appropriate level of care  Safety and  Monitoring:             -- Involuntary admission to inpatient psychiatric unit for safety, stabilization and treatment             -- Daily contact with patient to assess and evaluate symptoms and progress in treatment             -- Patient's case to be discussed in multi-disciplinary team meeting             -- Observation Level: q15 minute checks             -- Vital signs:  q12 hours             -- Precautions: suicide, elopement, and assault   2. Psychiatric Diagnoses and Treatment:  Check Depakote  levels 11/20/2024-60 Invega  at 3 mg increased to 6 mg           Depakote  500 mg every morning and 1500 mg   Zyprexa  20 mg nightly  -- The risks/benefits/side-effects/alternatives to this medication were discussed in detail with the patient and time was given for questions. The patient consents to medication trial.                -- Metabolic profile and EKG monitoring obtained while on an atypical antipsychotic (BMI: Lipid Panel: HbgA1c: QTc:)              -- Encouraged patient to participate in unit milieu and in scheduled group therapies   Occupational Therapy recommendations If plan is discharge home, recommend the following:   Direct supervision/assist for medications management;Direct supervision/assist for financial management;Assist for transportation;Assistance with cooking/housework    Ms Azucena was seen for OT treatment on this date. Upon arrival to room pt in dayroom, agreeable to tx. The SLUMS is a 30-point screening questionnaire that tests orientation, memory, attention, problem solving, and executive function. Pt scored a 13/30 indicating Dementia. Of note, it is not within occupational therapy scope of practice to diagnose cognitive impairments, this screen indicates need for further testing. Pt with noted impairments in memory and problem solving limiting ability to participate functionally in medication management, bill management, and safety cooking. Will continue to follow  POC. Continue to recommend SUPERVISION for safety with iADLs.    [facility] Mental Status Examination Orientation: 2/3  Calculations: 0/3 Naming animals: 2/3 Patient named 14 animals (0 points is 0-4 animals; 1 is 5-9 animals; 2 is 10-14 animals; 3 is 15+ animals) Recall: 3/5  Attention: 1/2 Clock drawing: 1/4 Visual Processing: 2/2 Paragraph Memory: 2/8  Total: 13/30;  Given that patient has completed high school, this score falls in the dementia range.   4. Discharge Planning:   -- Social work and case management to assist with discharge planning and identification of hospital follow-up needs prior to discharge  -- Estimated LOS: 3-4 days  Millie JONELLE Manners, MD 12/08/2024, 5:46 PM

## 2024-12-28 NOTE — Plan of Care (Signed)
" °  Problem: Safety: Goal: Periods of time without injury will increase Outcome: Progressing   Problem: Coping: Goal: Ability to verbalize frustrations and anger appropriately will improve Outcome: Not Progressing Goal: Ability to demonstrate self-control will improve Outcome: Not Progressing   "

## 2024-12-28 NOTE — Group Note (Signed)
 Date:  12/28/2024 Time:  8:35 PM  Group Topic/Focus:  Coping With Mental Health Crisis:   The purpose of this group is to help patients identify strategies for coping with mental health crisis.  Group discusses possible causes of crisis and ways to manage them effectively. Healthy Communication:   The focus of this group is to discuss communication, barriers to communication, as well as healthy ways to communicate with others. Overcoming Stress:   The focus of this group is to define stress and help patients assess their triggers. Self Care:   The focus of this group is to help patients understand the importance of self-care in order to improve or restore emotional, physical, spiritual, interpersonal, and financial health.    Participation Level:  Active  Participation Quality:  Appropriate  Affect:  Appropriate  Cognitive:  Alert and Appropriate  Insight: Appropriate  Engagement in Group:  Engaged and Supportive  Modes of Intervention:  Discussion and Support  Additional Comments:  Pt did attend tonight's wrap up group. Will continue to encourage pt to attend group sessions and participate in discussion with peers.  Brad GORMAN Ryder 12/28/2024, 8:35 PM

## 2024-12-28 NOTE — Progress Notes (Signed)
 Patient pleasant during interaction. Accepted medications without issue.Denies SI/HI/AVH. Ongoing monitoring continues.   12/28/24 1000  Psychosocial Assessment  Patient Complaints None  Eye Contact Fair  Facial Expression Flat  Affect Appropriate to circumstance  Speech Logical/coherent  Interaction Minimal  Motor Activity Slow  Appearance/Hygiene Unremarkable  Behavior Characteristics Cooperative;Calm  Mood Pleasant  Thought Process  Coherency WDL  Content WDL  Delusions None reported or observed  Perception WDL  Hallucination None reported or observed  Judgment WDL  Confusion None  Danger to Self  Current suicidal ideation? Denies  Danger to Others  Danger to Others None reported or observed

## 2024-12-28 NOTE — Group Note (Signed)
 Date:  12/28/2024 Time:  2:17 PM  Group Topic/Focus:  Coping With Mental Health Crisis:   The purpose of this group is to help patients identify strategies for coping with mental health crisis.  Group discusses possible causes of crisis and ways to manage them effectively.    Participation Level:  Did Not Attend    Shari [name] Bias 12/28/2024, 2:17 PM

## 2024-12-28 NOTE — Group Note (Signed)
 Date:  12/28/2024 Time:  10:04 AM  Group Topic/Focus:  Movement Therapy, Morning Stretch with [name].    Participation Level:  Did Not Attend    Shari [name] Bias 12/28/2024, 10:04 AM

## 2024-12-29 DIAGNOSIS — F0390 Unspecified dementia without behavioral disturbance: Secondary | ICD-10-CM | POA: Diagnosis not present

## 2024-12-29 DIAGNOSIS — F200 Paranoid schizophrenia: Secondary | ICD-10-CM | POA: Diagnosis not present

## 2024-12-29 NOTE — Plan of Care (Addendum)
  Problem: Activity: Goal: Sleeping patterns will improve Outcome: Progressing   Problem: Activity: Goal: Interest or engagement in activities will improve Outcome: Progressing

## 2024-12-29 NOTE — Plan of Care (Signed)
   Problem: Education: Goal: Knowledge of Shari [name] General Education information/materials will improve Outcome: Progressing Goal: Emotional status will improve Outcome: Progressing Goal: Mental status will improve Outcome: Progressing Goal: Verbalization of understanding the information provided will improve Outcome: Progressing   Problem: Activity: Goal: Interest or engagement in activities will improve Outcome: Progressing Goal: Sleeping patterns will improve Outcome: Progressing   Problem: Coping: Goal: Ability to verbalize frustrations and anger appropriately will improve Outcome: Progressing Goal: Ability to demonstrate self-control will improve Outcome: Progressing

## 2024-12-29 NOTE — Progress Notes (Signed)
 Patient was cooperative and pleasant during interaction. Attended the wrap up session and interacted with others in the day room. Denies anxiety, depression, SI/HI/AVH. Tolerated medications without any issue. Slept for 6.5 hrs. PRN lozenge was given for sore throat.     12/28/24 2200  Psych Admission Type (Psych Patients Only)  Admission Status Voluntary  Psychosocial Assessment  Patient Complaints None  Eye Contact Fair  Facial Expression Flat  Affect Appropriate to circumstance  Speech Logical/coherent  Interaction Minimal  Motor Activity Slow  Appearance/Hygiene Unremarkable  Behavior Characteristics Cooperative;Appropriate to situation  Mood Pleasant  Thought Process  Coherency WDL  Content WDL  Delusions None reported or observed  Perception WDL  Hallucination None reported or observed  Judgment WDL  Confusion None  Danger to Self  Current suicidal ideation? Denies  Danger to Others  Danger to Others None reported or observed

## 2024-12-29 NOTE — Progress Notes (Signed)
" °   12/29/24 1122  Psych Admission Type (Psych Patients Only)  Admission Status Voluntary  Psychosocial Assessment  Patient Complaints None  Eye Contact Other (Comment) (WDL)  Facial Expression Other (Comment) (WDL)  Affect Appropriate to circumstance  Speech Logical/coherent  Interaction Other (Comment) (WDL)  Motor Activity Slow  Appearance/Hygiene Unremarkable  Behavior Characteristics Cooperative;Appropriate to situation  Mood Pleasant  Thought Process  Coherency WDL  Content WDL  Delusions None reported or observed  Perception WDL  Hallucination None reported or observed  Judgment WDL  Confusion None  Danger to Self  Current suicidal ideation? Denies  Danger to Others  Danger to Others None reported or observed    "

## 2024-12-29 NOTE — Group Note (Signed)
 Date:  12/29/2024 Time:  10:37 AM  Group Topic/Focus:  Coping With Mental Health Crisis:   The purpose of this group is to help patients identify strategies for coping with mental health crisis.  Group discusses possible causes of crisis and ways to manage them effectively.  I led a group session focused on engagement, communication, and positive coping skills. The session began with an icebreaker activity, Two Truths and One Lie, which allowed participants the opportunity to learn more about one another and build rapport in a supportive environment.  I then guided the group through Would You Rather and What Would You Do activities to encourage critical thinking, decision-making, and discussion of real-life situations. Participants were prompted to explore positive perspectives, problem-solving strategies, and healthy responses to challenging circumstances.  Group members were engaged throughout the session, demonstrated appropriate interactions, and actively participated in discussions. Overall, the group showed cooperation, insight, and openness to identifying positive ways to approach difficult situations.   Participation Level:  Did Not Attend  Participation Quality:    Affect:    Cognitive:    Insight:   Engagement in Group:    Modes of Intervention:    Additional Comments:    Shari [name] L Shari [name] 12/29/2024, 10:37 AM

## 2024-12-29 NOTE — Group Note (Signed)
 Date:  12/29/2024 Time:  8:53 PM  Group Topic/Focus:  Wrap-Up Group:   The focus of this group is to help patients review their daily goal of treatment and discuss progress on daily workbooks.    Participation Level:  Active  Participation Quality:  Appropriate  Affect:  Appropriate  Cognitive:  Alert  Insight: Good  Engagement in Group:  Engaged  Modes of Intervention:  Discussion  Additional Comments:    Shari [name] [name] 12/29/2024, 8:53 PM

## 2024-12-29 NOTE — Progress Notes (Signed)
 [facility] MD Progress Note  Patient is a 69 year old female with a history of Schizoaffective Disorder, Bipolar Type who presents via [facility] under IVC, initiated by CM, Falencio with re-entry program, to [facility] for assessment. Per IVC, [name] has been diagnosed with Schizophrenia and is refusing to take medication. [name] isn't sleeping. Residents report her screaming and yelling all night long.  [name] responding to internal stimuli.  [name] verbally aggressive.  The respondent has locked all of the residents inside of the transitional housing home.  They could not leave the home which prompted the IVC.  Residents did not have access to food, the restroom or anything as a result of being locked in.  [name] is currently under Adult protective services care. Patient is admitted to [facility] unit with Q15 min safety monitoring. Multidisciplinary team approach is offered. Medication management; group/milieu therapy is offered.   Subjective:  Chart reviewed, case discussed in multidisciplinary meeting, patient seen during rounds.   Patient is noted to be resting in bed.  She offers no complaints.  Per nursing she had some muscle aches today morning and received lidocaine  patch.  She denies feeling depressed or anxious.  She denies SI/HI/plan.  The patient remains psychiatrically stable at this time. [name] awaiting placement, and continued inpatient care is medically necessary primarily due to disposition considerations rather than acute psychiatric decompensation.   Psychiatric History: see h&P Family History:  Family History  Problem Relation Age of Onset   Diabetes Mother    CAD Mother    Lung cancer Father    Social History:  Social History   Substance and Sexual Activity  Alcohol Use Yes   Comment: beer occasionally     Social History   Substance and Sexual Activity  Drug Use No    Social History   Socioeconomic History   Marital status: Widowed     Spouse name: Not on file   Number of children: Not on file   Years of education: Not on file   Highest education level: Not on file  Occupational History   Not on file  Tobacco Use   Smoking status: Every Day    Current packs/day: 0.50    Average packs/day: 0.5 packs/day for 40.0 years (20.0 ttl pk-yrs)    Types: Cigarettes   Smokeless tobacco: Never  Vaping Use   Vaping status: Never Used  Substance and Sexual Activity   Alcohol use: Yes    Comment: beer occasionally   Drug use: No   Sexual activity: Not Currently    Birth control/protection: Post-menopausal  Other Topics Concern   Not on file  Social History Narrative   Not on file   Social Drivers of Health   Tobacco Use: High Risk (11/02/2024)   Patient History    Smoking Tobacco Use: Every Day    Smokeless Tobacco Use: Never    Passive Exposure: Not on file  Financial Resource Strain: Not on file  Food Insecurity: No Food Insecurity (10/17/2024)   Epic    Worried About Programme Researcher, Broadcasting/film/video in the Last Year: Never true    Ran Out of Food in the Last Year: Never true  Transportation Needs: No Transportation Needs (10/17/2024)   Epic    Lack of Transportation (Medical): No    Lack of Transportation (Non-Medical): No  Recent Concern: Transportation Needs - Unmet Transportation Needs (10/14/2024)   Epic    Lack of Transportation (Medical): Yes    Lack  of Transportation (Non-Medical): Yes  Physical Activity: Not on file  Stress: Not on file  Social Connections: Unknown (10/17/2024)   Social Connection and Isolation Panel    Frequency of Communication with Friends and Family: Patient unable to answer    Frequency of Social Gatherings with Friends and Family: Patient unable to answer    Attends Religious Services: Patient unable to answer    Active Member of Clubs or Organizations: Patient declined    Attends Banker Meetings: Patient unable to answer    Marital Status: Widowed  Depression (PHQ2-9): Not  on file  Alcohol Screen: Low Risk (10/17/2024)   Alcohol Screen    Last Alcohol Screening Score (AUDIT): 0  Housing: Low Risk (10/17/2024)   Epic    Unable to Pay for Housing in the Last Year: No    Number of Times Moved in the Last Year: 0    Homeless in the Last Year: No  Utilities: Not At Risk (10/17/2024)   Epic    Threatened with loss of utilities: No  Health Literacy: Not on file   Past Medical History:  Past Medical History:  Diagnosis Date   Eye globe prosthesis    GERD (gastroesophageal reflux disease)    History of blood transfusion 1973   related to abscess burst in my stomach   Hyperlipemia    Hyperlipidemia    Hypertension    Osteoarthritis of back    Lowerback    SVD (spontaneous vaginal delivery)    x 1, baby died at 2 wks of age   Type II diabetes mellitus (HCC)     Past Surgical History:  Procedure Laterality Date   APPENDECTOMY     COLONOSCOPY     DILATATION & CURETTAGE/HYSTEROSCOPY WITH MYOSURE N/A 02/25/2015   Procedure: DILATATION & CURETTAGE/HYSTEROSCOPY WITH MYOSURE;  Surgeon: Truman Corona, MD;  Location: [facility];  Service: Gynecology;  Laterality: N/A;   DILATION AND CURETTAGE OF UTERUS     ENUCLEATION Right 03/16/2017   ENUCLEATION Right 03/16/2017   Procedure: ENUCLEATION RIGHT EYE;  Surgeon: Loyd Kathryne Palm, MD;  Location: [facility] OR;  Service: Ophthalmology;  Laterality: Right;   EYE SURGERY     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6, 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   EYE SURGERY Right ~ 1974   S/P initial eye injury; scissors stuck in my eye   LAPAROSCOPIC CHOLECYSTECTOMY     SHOULDER ARTHROSCOPY WITH ROTATOR CUFF REPAIR Left    wire stitches per patient   SHOULDER ARTHROSCOPY WITH ROTATOR CUFF REPAIR Right     Current Medications: Current Facility-Administered Medications  Medication Dose Route Frequency Provider Last Rate Last Admin   acetaminophen  (TYLENOL ) tablet 650 mg  650 mg Oral Q6H PRN Tex Drilling, NP   650 mg at 12/07/24 2126   alum & mag  hydroxide-simeth (MAALOX/MYLANTA) 200-200-20 MG/5ML suspension 30 mL  30 mL Oral Q4H PRN Tex Drilling, NP       cloNIDine  (CATAPRES ) tablet 0.1 mg  0.1 mg Oral BID PRN Tex Drilling, NP       divalproex  (DEPAKOTE ) DR tablet 1,500 mg  1,500 mg Oral QHS Renelda Kilian, MD   1,500 mg at 12/07/24 2126   divalproex  (DEPAKOTE ) DR tablet 500 mg  500 mg Oral q AM [name], MD   500 mg at 12/08/24 9192   losartan  (COZAAR ) tablet 25 mg  25 mg Oral Daily Nkwenti, Doris, NP   25 mg at 12/08/24 9071   magnesium  hydroxide (MILK OF MAGNESIA)  suspension 30 mL  30 mL Oral Daily PRN Tex Drilling, NP       menthol  (CEPACOL) lozenge 3 mg  1 lozenge Oral PRN Bobbitt, Shalon E, NP   3 mg at 12/08/24 0934   methimazole  (TAPAZOLE ) tablet 10 mg  10 mg Oral Daily Nkwenti, Doris, NP   10 mg at 12/08/24 9071   OLANZapine  (ZYPREXA ) injection 5 mg  5 mg Intramuscular TID PRN Tex Drilling, NP       OLANZapine  (ZYPREXA ) injection 5 mg  5 mg Intramuscular TID PRN Tex Drilling, NP       OLANZapine  (ZYPREXA ) tablet 20 mg  20 mg Oral QHS Zyiah Withington, MD   20 mg at 12/07/24 2127   OLANZapine  zydis (ZYPREXA ) disintegrating tablet 5 mg  5 mg Oral TID PRN Tex Drilling, NP       OLANZapine  zydis (ZYPREXA ) disintegrating tablet 5 mg  5 mg Oral TID PRN Tex Drilling, NP       paliperidone  (INVEGA ) 24 hr tablet 3 mg  3 mg Oral Daily Shrivastava, Aryendra, MD   3 mg at 12/08/24 9071   pantoprazole  (PROTONIX ) EC tablet 40 mg  40 mg Oral Daily Nkwenti, Doris, NP   40 mg at 12/08/24 9071   traZODone  (DESYREL ) tablet 50 mg  50 mg Oral QHS PRN Tex Drilling, NP   50 mg at 12/06/24 2109    Lab Results:  No results found for this or any previous visit (from the past 48 hours).       Blood Alcohol level:  Lab Results  Component Value Date   [facility] <15 10/14/2024   ETH <10 07/02/2022    Metabolic Disorder Labs: Lab Results  Component Value Date   HGBA1C 5.5 10/14/2024   MPG 111.15 10/14/2024   MPG 137  01/06/2022   Lab Results  Component Value Date   PROLACTIN 5.8 10/14/2024   Lab Results  Component Value Date   CHOL 167 10/14/2024   TRIG 68 10/14/2024   HDL 60 10/14/2024   CHOLHDL 2.8 10/14/2024   VLDL 14 10/14/2024   LDLCALC 93 10/14/2024   LDLCALC 73 03/13/2024    Physical Findings: AIMS:  , ,  ,  ,    CIWA:    COWS:      Psychiatric Specialty Exam:  Presentation  General Appearance:  Appropriate for Environment  Eye Contact: Fleeting  Speech: Normal Rate  Speech Volume: Decreased    Mood and Affect  Mood:fine  Affect: Flat   Thought Process  Thought Processes: impoverished  Orientation:Partial  Thought Content: Chronic delusions Hallucinations: Denies  Ideas of Reference:Delusions; Paranoia  Suicidal Thoughts: Denies  Homicidal Thoughts: Denies   Sensorium  Memory: Immediate Fair; Recent Fair  Judgment: Impaired  Insight: Shallow   Executive Functions  Concentration: Fair  Attention Span: Fair  Recall: Poor  Fund of Knowledge: Fair  Language: Fair   Psychomotor Activity  Psychomotor Activity: No data recorded  Musculoskeletal: Strength & Muscle Tone: within normal limits Gait & Station: normal Assets  Assets: Manufacturing Systems Engineer; Desire for Improvement; Social Support    Physical Exam: Physical Exam Vitals and nursing note reviewed.    ROS Blood pressure (!) 152/69, pulse 98, temperature 98.3 F (36.8 C), resp. rate 18, height 5' 8 (1.727 m), weight 48.3 kg, SpO2 100%. Body mass index is 16.19 kg/m.  Diagnosis: Principal Problem:   Schizophrenia, paranoid (HCC) Major neurocognitive disorder-slums total score of 13/30   Treatment Plan Summary: APS referral has been made  as patient lacks capacity to make medical decisions.  Recommending legal guardianship and further placement at appropriate level of care  Safety and Monitoring:             -- Involuntary admission to inpatient psychiatric  unit for safety, stabilization and treatment             -- Daily contact with patient to assess and evaluate symptoms and progress in treatment             -- Patient's case to be discussed in multi-disciplinary team meeting             -- Observation Level: q15 minute checks             -- Vital signs:  q12 hours             -- Precautions: suicide, elopement, and assault   2. Psychiatric Diagnoses and Treatment:  Check Depakote  levels 11/20/2024-60 Invega  at 3 mg increased to 6 mg           Depakote  500 mg every morning and 1500 mg   Zyprexa  20 mg nightly  -- The risks/benefits/side-effects/alternatives to this medication were discussed in detail with the patient and time was given for questions. The patient consents to medication trial.                -- Metabolic profile and EKG monitoring obtained while on an atypical antipsychotic (BMI: Lipid Panel: HbgA1c: QTc:)              -- Encouraged patient to participate in unit milieu and in scheduled group therapies   Occupational Therapy recommendations If plan is discharge home, recommend the following:   Direct supervision/assist for medications management;Direct supervision/assist for financial management;Assist for transportation;Assistance with cooking/housework    Ms Peabody was seen for OT treatment on this date. Upon arrival to room pt in dayroom, agreeable to tx. The SLUMS is a 30-point screening questionnaire that tests orientation, memory, attention, problem solving, and executive function. Pt scored a 13/30 indicating Dementia. Of note, it is not within occupational therapy scope of practice to diagnose cognitive impairments, this screen indicates need for further testing. Pt with noted impairments in memory and problem solving limiting ability to participate functionally in medication management, bill management, and safety cooking. Will continue to follow POC. Continue to recommend SUPERVISION for safety with iADLs.    [facility] Mental Status Examination Orientation: 2/3  Calculations: 0/3 Naming animals: 2/3 Patient named 14 animals (0 points is 0-4 animals; 1 is 5-9 animals; 2 is 10-14 animals; 3 is 15+ animals) Recall: 3/5  Attention: 1/2 Clock drawing: 1/4 Visual Processing: 2/2 Paragraph Memory: 2/8  Total: 13/30;  Given that patient has completed high school, this score falls in the dementia range.   4. Discharge Planning:   -- Social work and case management to assist with discharge planning and identification of hospital follow-up needs prior to discharge  -- Estimated LOS: 3-4 days  Millie JONELLE Manners, MD 12/08/2024, 5:46 PM

## 2024-12-30 DIAGNOSIS — F200 Paranoid schizophrenia: Secondary | ICD-10-CM | POA: Diagnosis not present

## 2024-12-30 DIAGNOSIS — F0390 Unspecified dementia without behavioral disturbance: Secondary | ICD-10-CM | POA: Diagnosis not present

## 2024-12-30 NOTE — Group Note (Signed)
 Date:  12/30/2024 Time:  9:51 AM  Group Topic/Focus:  Movement Therapy.    Participation Level:  Did Not Attend    Shari [name] Bias 12/30/2024, 9:51 AM

## 2024-12-30 NOTE — Group Note (Signed)
 Date:  12/30/2024 Time:  9:33 PM  Group Topic/Focus:  Wrap-Up Group:   The focus of this group is to help patients review their daily goal of treatment and discuss progress on daily workbooks.    Participation Level:  Active  Participation Quality:  Appropriate  Affect:  Appropriate  Cognitive:  Alert  Insight: Appropriate  Engagement in Group:  Engaged  Modes of Intervention:  Discussion  Additional Comments:    [name] [name] 12/30/2024, 9:33 PM

## 2024-12-30 NOTE — Plan of Care (Signed)
   Problem: Education: Goal: Verbalization of understanding the information provided will improve Outcome: Progressing   Problem: Activity: Goal: Interest or engagement in activities will improve Outcome: Progressing Goal: Sleeping patterns will improve Outcome: Progressing

## 2024-12-30 NOTE — Progress Notes (Signed)
 [facility] MD Progress Note  Patient is a 69 year old female with a history of Schizoaffective Disorder, Bipolar Type who presents via [facility] under IVC, initiated by CM, Falencio with re-entry program, to [facility] for assessment. Per IVC, [name] has been diagnosed with Schizophrenia and is refusing to take medication. [name] isn't sleeping. Residents report her screaming and yelling all night long.  [name] responding to internal stimuli.  [name] verbally aggressive.  The respondent has locked all of the residents inside of the transitional housing home.  They could not leave the home which prompted the IVC.  Residents did not have access to food, the restroom or anything as a result of being locked in.  [name] is currently under Adult protective services care. Patient is admitted to [facility] unit with Q15 min safety monitoring. Multidisciplinary team approach is offered. Medication management; group/milieu therapy is offered.   Subjective:  Chart reviewed, case discussed in multidisciplinary meeting, patient seen during rounds.   Patient is noted to be resting in bed.  She offers no complaints.  She reports feeling cold in her room given the cool temperatures outside.  Per nursing patient is taking her medications with no reported side effects.  She denies SI/HI/plan and denies hallucinations.  She remains frustrated about her current situation of being in the hospital and is unable to process that she she is being appointed a legal guardian who are working on the placement  The patient remains psychiatrically stable at this time. [name] awaiting placement, and continued inpatient care is medically necessary primarily due to disposition considerations rather than acute psychiatric decompensation.   Psychiatric History: see h&P Family History:  Family History  Problem Relation Age of Onset   Diabetes Mother    CAD Mother    Lung cancer Father    Social History:  Social  History   Substance and Sexual Activity  Alcohol Use Yes   Comment: beer occasionally     Social History   Substance and Sexual Activity  Drug Use No    Social History   Socioeconomic History   Marital status: Widowed    Spouse name: Not on file   Number of children: Not on file   Years of education: Not on file   Highest education level: Not on file  Occupational History   Not on file  Tobacco Use   Smoking status: Every Day    Current packs/day: 0.50    Average packs/day: 0.5 packs/day for 40.0 years (20.0 ttl pk-yrs)    Types: Cigarettes   Smokeless tobacco: Never  Vaping Use   Vaping status: Never Used  Substance and Sexual Activity   Alcohol use: Yes    Comment: beer occasionally   Drug use: No   Sexual activity: Not Currently    Birth control/protection: Post-menopausal  Other Topics Concern   Not on file  Social History Narrative   Not on file   Social Drivers of Health   Tobacco Use: High Risk (11/02/2024)   Patient History    Smoking Tobacco Use: Every Day    Smokeless Tobacco Use: Never    Passive Exposure: Not on file  Financial Resource Strain: Not on file  Food Insecurity: No Food Insecurity (10/17/2024)   Epic    Worried About Programme Researcher, Broadcasting/film/video in the Last Year: Never true    Ran Out of Food in the Last Year: Never true  Transportation Needs: No Transportation Needs (10/17/2024)   Epic  Lack of Transportation (Medical): No    Lack of Transportation (Non-Medical): No  Recent Concern: Transportation Needs - Unmet Transportation Needs (10/14/2024)   Epic    Lack of Transportation (Medical): Yes    Lack of Transportation (Non-Medical): Yes  Physical Activity: Not on file  Stress: Not on file  Social Connections: Unknown (10/17/2024)   Social Connection and Isolation Panel    Frequency of Communication with Friends and Family: Patient unable to answer    Frequency of Social Gatherings with Friends and Family: Patient unable to answer     Attends Religious Services: Patient unable to answer    Active Member of Clubs or Organizations: Patient declined    Attends Banker Meetings: Patient unable to answer    Marital Status: Widowed  Depression (PHQ2-9): Not on file  Alcohol Screen: Low Risk (10/17/2024)   Alcohol Screen    Last Alcohol Screening Score (AUDIT): 0  Housing: Low Risk (10/17/2024)   Epic    Unable to Pay for Housing in the Last Year: No    Number of Times Moved in the Last Year: 0    Homeless in the Last Year: No  Utilities: Not At Risk (10/17/2024)   Epic    Threatened with loss of utilities: No  Health Literacy: Not on file   Past Medical History:  Past Medical History:  Diagnosis Date   Eye globe prosthesis    GERD (gastroesophageal reflux disease)    History of blood transfusion 1973   related to abscess burst in my stomach   Hyperlipemia    Hyperlipidemia    Hypertension    Osteoarthritis of back    Lowerback    SVD (spontaneous vaginal delivery)    x 1, baby died at 2 wks of age   Type II diabetes mellitus (HCC)     Past Surgical History:  Procedure Laterality Date   APPENDECTOMY     COLONOSCOPY     DILATATION & CURETTAGE/HYSTEROSCOPY WITH MYOSURE N/A 02/25/2015   Procedure: DILATATION & CURETTAGE/HYSTEROSCOPY WITH MYOSURE;  Surgeon: Truman Corona, MD;  Location: [facility];  Service: Gynecology;  Laterality: N/A;   DILATION AND CURETTAGE OF UTERUS     ENUCLEATION Right 03/16/2017   ENUCLEATION Right 03/16/2017   Procedure: ENUCLEATION RIGHT EYE;  Surgeon: Loyd Kathryne Palm, MD;  Location: [facility] OR;  Service: Ophthalmology;  Laterality: Right;   EYE SURGERY     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6, 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   EYE SURGERY Right ~ 1974   S/P initial eye injury; scissors stuck in my eye   LAPAROSCOPIC CHOLECYSTECTOMY     SHOULDER ARTHROSCOPY WITH ROTATOR CUFF REPAIR Left    wire stitches per patient   SHOULDER ARTHROSCOPY WITH ROTATOR CUFF REPAIR Right     Current  Medications: Current Facility-Administered Medications  Medication Dose Route Frequency Provider Last Rate Last Admin   acetaminophen  (TYLENOL ) tablet 650 mg  650 mg Oral Q6H PRN Tex Drilling, NP   650 mg at 12/07/24 2126   alum & mag hydroxide-simeth (MAALOX/MYLANTA) 200-200-20 MG/5ML suspension 30 mL  30 mL Oral Q4H PRN Tex Drilling, NP       cloNIDine  (CATAPRES ) tablet 0.1 mg  0.1 mg Oral BID PRN Tex Drilling, NP       divalproex  (DEPAKOTE ) DR tablet 1,500 mg  1,500 mg Oral QHS Shiron Whetsel, MD   1,500 mg at 12/07/24 2126   divalproex  (DEPAKOTE ) DR tablet 500 mg  500 mg Oral q AM Roark Rufo,  Delania Ferg, MD   500 mg at 12/08/24 9192   losartan  (COZAAR ) tablet 25 mg  25 mg Oral Daily Nkwenti, Doris, NP   25 mg at 12/08/24 9071   magnesium  hydroxide (MILK OF MAGNESIA) suspension 30 mL  30 mL Oral Daily PRN Tex Drilling, NP       menthol  (CEPACOL) lozenge 3 mg  1 lozenge Oral PRN Bobbitt, Shalon E, NP   3 mg at 12/08/24 0934   methimazole  (TAPAZOLE ) tablet 10 mg  10 mg Oral Daily Nkwenti, Doris, NP   10 mg at 12/08/24 9071   OLANZapine  (ZYPREXA ) injection 5 mg  5 mg Intramuscular TID PRN Tex Drilling, NP       OLANZapine  (ZYPREXA ) injection 5 mg  5 mg Intramuscular TID PRN Tex Drilling, NP       OLANZapine  (ZYPREXA ) tablet 20 mg  20 mg Oral QHS Pebble Botkin, MD   20 mg at 12/07/24 2127   OLANZapine  zydis (ZYPREXA ) disintegrating tablet 5 mg  5 mg Oral TID PRN Tex Drilling, NP       OLANZapine  zydis (ZYPREXA ) disintegrating tablet 5 mg  5 mg Oral TID PRN Tex Drilling, NP       paliperidone  (INVEGA ) 24 hr tablet 3 mg  3 mg Oral Daily Shrivastava, Aryendra, MD   3 mg at 12/08/24 9071   pantoprazole  (PROTONIX ) EC tablet 40 mg  40 mg Oral Daily Nkwenti, Doris, NP   40 mg at 12/08/24 9071   traZODone  (DESYREL ) tablet 50 mg  50 mg Oral QHS PRN Tex Drilling, NP   50 mg at 12/06/24 2109    Lab Results:  No results found for this or any previous visit (from the past 48  hours).       Blood Alcohol level:  Lab Results  Component Value Date   [facility] <15 10/14/2024   ETH <10 07/02/2022    Metabolic Disorder Labs: Lab Results  Component Value Date   HGBA1C 5.5 10/14/2024   MPG 111.15 10/14/2024   MPG 137 01/06/2022   Lab Results  Component Value Date   PROLACTIN 5.8 10/14/2024   Lab Results  Component Value Date   CHOL 167 10/14/2024   TRIG 68 10/14/2024   HDL 60 10/14/2024   CHOLHDL 2.8 10/14/2024   VLDL 14 10/14/2024   LDLCALC 93 10/14/2024   LDLCALC 73 03/13/2024    Physical Findings: AIMS:  , ,  ,  ,    CIWA:    COWS:      Psychiatric Specialty Exam:  Presentation  General Appearance:  Appropriate for Environment  Eye Contact: Fleeting  Speech: Normal Rate  Speech Volume: Decreased    Mood and Affect  Mood:fine  Affect: Flat   Thought Process  Thought Processes: impoverished  Orientation:Partial  Thought Content: Chronic delusions Hallucinations: Denies  Ideas of Reference:Delusions; Paranoia  Suicidal Thoughts: Denies  Homicidal Thoughts: Denies   Sensorium  Memory: Immediate Fair; Recent Fair  Judgment: Impaired  Insight: Shallow   Executive Functions  Concentration: Fair  Attention Span: Fair  Recall: Poor  Fund of Knowledge: Fair  Language: Fair   Psychomotor Activity  Psychomotor Activity: No data recorded  Musculoskeletal: Strength & Muscle Tone: within normal limits Gait & Station: normal Assets  Assets: Manufacturing Systems Engineer; Desire for Improvement; Social Support    Physical Exam: Physical Exam Vitals and nursing note reviewed.    ROS Blood pressure (!) 152/69, pulse 98, temperature 98.3 F (36.8 C), resp. rate 18, height 5' 8 (1.727  m), weight 48.3 kg, SpO2 100%. Body mass index is 16.19 kg/m.  Diagnosis: Principal Problem:   Schizophrenia, paranoid (HCC) Major neurocognitive disorder-slums total score of 13/30   Treatment Plan Summary:  APS took legal guardianship and  working further placement at appropriate level of care  Safety and Monitoring:             -- Involuntary admission to inpatient psychiatric unit for safety, stabilization and treatment             -- Daily contact with patient to assess and evaluate symptoms and progress in treatment             -- Patient's case to be discussed in multi-disciplinary team meeting             -- Observation Level: q15 minute checks             -- Vital signs:  q12 hours             -- Precautions: suicide, elopement, and assault   2. Psychiatric Diagnoses and Treatment:  Check Depakote  levels 11/20/2024-60 Invega  6 mg           Depakote  500 mg every morning and 1500 mg   Zyprexa  20 mg nightly  -- The risks/benefits/side-effects/alternatives to this medication were discussed in detail with the patient and time was given for questions. The patient consents to medication trial.                -- Metabolic profile and EKG monitoring obtained while on an atypical antipsychotic (BMI: Lipid Panel: HbgA1c: QTc:)              -- Encouraged patient to participate in unit milieu and in scheduled group therapies   Occupational Therapy recommendations If plan is discharge home, recommend the following:   Direct supervision/assist for medications management;Direct supervision/assist for financial management;Assist for transportation;Assistance with cooking/housework    Ms Huckaba was seen for OT treatment on this date. Upon arrival to room pt in dayroom, agreeable to tx. The SLUMS is a 30-point screening questionnaire that tests orientation, memory, attention, problem solving, and executive function. Pt scored a 13/30 indicating Dementia. Of note, it is not within occupational therapy scope of practice to diagnose cognitive impairments, this screen indicates need for further testing. Pt with noted impairments in memory and problem solving limiting ability to participate functionally in  medication management, bill management, and safety cooking. Will continue to follow POC. Continue to recommend SUPERVISION for safety with iADLs.    [facility] Mental Status Examination Orientation: 2/3  Calculations: 0/3 Naming animals: 2/3 Patient named 14 animals (0 points is 0-4 animals; 1 is 5-9 animals; 2 is 10-14 animals; 3 is 15+ animals) Recall: 3/5  Attention: 1/2 Clock drawing: 1/4 Visual Processing: 2/2 Paragraph Memory: 2/8  Total: 13/30;  Given that patient has completed high school, this score falls in the dementia range.   4. Discharge Planning:   -- Social work and case management to assist with discharge planning and identification of hospital follow-up needs prior to discharge  -- Estimated LOS: 3-4 days  Millie JONELLE Manners, MD 12/08/2024, 5:46 PM

## 2024-12-30 NOTE — BH IP Treatment Plan (Signed)
 Interdisciplinary Treatment and Diagnostic Plan Update  12/30/2024 Time of Session: 9:00 AM  Shari [name] MRN: [id number]  Principal Diagnosis: Schizophrenia, paranoid (HCC)  Secondary Diagnoses: Principal Problem:   Schizophrenia, paranoid (HCC)   Current Medications:  Current Facility-Administered Medications  Medication Dose Route Frequency Provider Last Rate Last Admin   acetaminophen  (TYLENOL ) tablet 650 mg  650 mg Oral Q6H PRN Tex Drilling, NP   650 mg at 12/29/24 0756   alum & mag hydroxide-simeth (MAALOX/MYLANTA) 200-200-20 MG/5ML suspension 30 mL  30 mL Oral Q4H PRN Nkwenti, Drilling, NP       cloNIDine  (CATAPRES ) tablet 0.1 mg  0.1 mg Oral BID PRN Tex Drilling, NP       diclofenac  Sodium (VOLTAREN ) 1 % topical gel 4 g  4 g Topical QID PRN Jadapalle, Sree, MD       divalproex  (DEPAKOTE ) DR tablet 1,500 mg  1,500 mg Oral QHS Jadapalle, Sree, MD   1,500 mg at 12/29/24 2129   divalproex  (DEPAKOTE ) DR tablet 500 mg  500 mg Oral q AM Jadapalle, Sree, MD   500 mg at 12/30/24 9261   famotidine  (PEPCID ) 40 MG/5ML suspension 40 mg  40 mg Oral QHS Jadapalle, Sree, MD   40 mg at 12/29/24 2130   lidocaine  (LIDODERM ) 5 % 1 patch  1 patch Transdermal Q24H Jadapalle, Sree, MD   1 patch at 12/29/24 9244   losartan  (COZAAR ) tablet 25 mg  25 mg Oral Daily Nkwenti, Doris, NP   25 mg at 12/30/24 1032   magnesium  hydroxide (MILK OF MAGNESIA) suspension 30 mL  30 mL Oral Daily PRN Tex Drilling, NP       menthol  (CEPACOL) lozenge 3 mg  1 lozenge Oral PRN Bobbitt, Shalon E, NP   3 mg at 12/28/24 2130   methimazole  (TAPAZOLE ) tablet 10 mg  10 mg Oral Daily Nkwenti, Doris, NP   10 mg at 12/30/24 1032   OLANZapine  (ZYPREXA ) injection 5 mg  5 mg Intramuscular TID PRN Tex Drilling, NP       OLANZapine  (ZYPREXA ) injection 5 mg  5 mg Intramuscular TID PRN Tex Drilling, NP       OLANZapine  (ZYPREXA ) tablet 20 mg  20 mg Oral QHS Jadapalle, Sree, MD   20 mg at 12/29/24 2130   OLANZapine  zydis (ZYPREXA )  disintegrating tablet 5 mg  5 mg Oral TID PRN Tex Drilling, NP       OLANZapine  zydis (ZYPREXA ) disintegrating tablet 5 mg  5 mg Oral TID PRN Tex Drilling, NP       paliperidone  (INVEGA ) 24 hr tablet 6 mg  6 mg Oral Daily Jadapalle, Sree, MD   6 mg at 12/30/24 1032   pantoprazole  (PROTONIX ) EC tablet 40 mg  40 mg Oral BID AC Zhang, Ping T, MD   40 mg at 12/30/24 9261   sucralfate  (CARAFATE ) 1 GM/10ML suspension 1 g  1 g Oral TID WC & HS Laurita Manor T, MD   1 g at 12/30/24 1137   traZODone  (DESYREL ) tablet 50 mg  50 mg Oral QHS PRN Tex Drilling, NP   50 mg at 12/28/24 2116   PTA Medications: Medications Prior to Admission  Medication Sig Dispense Refill Last Dose/Taking   Ascorbic Acid (VITAMIN C PO) Take 1 tablet by mouth daily. (Patient not taking: Reported on 10/14/2024)      divalproex  (DEPAKOTE ) 500 MG DR tablet Take 1 tablet (500 mg total) by mouth at bedtime. (Patient not taking: Reported on 10/14/2024) 30 tablet 1  losartan  (COZAAR ) 25 MG tablet Take 1 tablet (25 mg total) by mouth daily. (Patient not taking: Reported on 10/14/2024) 90 tablet 1    methimazole  (TAPAZOLE ) 10 MG tablet Take 1 tablet (10 mg total) by mouth daily. (Patient not taking: Reported on 10/14/2024) 90 tablet 1    OLANZapine  (ZYPREXA ) 10 MG tablet Take 1 tablet (10 mg total) by mouth at bedtime. (Patient not taking: Reported on 10/14/2024) 30 tablet 1    pantoprazole  (PROTONIX ) 40 MG tablet Take 1 tablet (40 mg total) by mouth daily. (Patient not taking: Reported on 10/14/2024) 90 tablet 0    propranolol  (INDERAL ) 10 MG tablet Take 1 tablet (10 mg total) by mouth 2 (two) times daily. (Patient not taking: Reported on 10/14/2024) 180 tablet 1    VITAMIN D PO Take 1 tablet by mouth daily. (Patient not taking: Reported on 10/14/2024)      VITAMIN E PO Take 1 tablet by mouth daily. (Patient not taking: Reported on 10/14/2024)       Patient Stressors: Medication change or noncompliance    Patient Strengths:  Communication skills   Treatment Modalities: Medication Management, Group therapy, Case management,  1 to 1 session with clinician, Psychoeducation, Recreational therapy.   Physician Treatment Plan for Primary Diagnosis: Schizophrenia, paranoid (HCC) Long Term Goal(s): Improvement in symptoms so as ready for discharge   Short Term Goals: Ability to identify changes in lifestyle to reduce recurrence of condition will improve Ability to verbalize feelings will improve Ability to disclose and discuss suicidal ideas Ability to demonstrate self-control will improve Ability to identify and develop effective coping behaviors will improve Ability to maintain clinical measurements within normal limits will improve  Medication Management: Evaluate patient's response, side effects, and tolerance of medication regimen.  Therapeutic Interventions: 1 to 1 sessions, Unit Group sessions and Medication administration.  Evaluation of Outcomes: Adequate for Discharge  Physician Treatment Plan for Secondary Diagnosis: Principal Problem:   Schizophrenia, paranoid (HCC)  Long Term Goal(s): Improvement in symptoms so as ready for discharge   Short Term Goals: Ability to identify changes in lifestyle to reduce recurrence of condition will improve Ability to verbalize feelings will improve Ability to disclose and discuss suicidal ideas Ability to demonstrate self-control will improve Ability to identify and develop effective coping behaviors will improve Ability to maintain clinical measurements within normal limits will improve     Medication Management: Evaluate patient's response, side effects, and tolerance of medication regimen.  Therapeutic Interventions: 1 to 1 sessions, Unit Group sessions and Medication administration.  Evaluation of Outcomes: Adequate for Discharge   RN Treatment Plan for Primary Diagnosis: Schizophrenia, paranoid (HCC) Long Term Goal(s): Knowledge of disease and  therapeutic regimen to maintain health will improve  Short Term Goals: Ability to remain free from injury will improve, Ability to verbalize frustration and anger appropriately will improve, Ability to demonstrate self-control, Ability to participate in decision making will improve, Ability to verbalize feelings will improve, Ability to disclose and discuss suicidal ideas, Ability to identify and develop effective coping behaviors will improve, and Compliance with prescribed medications will improve  Medication Management: RN will administer medications as ordered by provider, will assess and evaluate patient's response and provide education to patient for prescribed medication. RN will report any adverse and/or side effects to prescribing provider.  Therapeutic Interventions: 1 on 1 counseling sessions, Psychoeducation, Medication administration, Evaluate responses to treatment, Monitor vital signs and CBGs as ordered, Perform/monitor CIWA, COWS, AIMS and Fall Risk screenings as ordered, Perform wound care  treatments as ordered.  Evaluation of Outcomes: Adequate for Discharge   LCSW Treatment Plan for Primary Diagnosis: Schizophrenia, paranoid (HCC) Long Term Goal(s): Safe transition to appropriate next level of care at discharge, Engage patient in therapeutic group addressing interpersonal concerns.  Short Term Goals: Engage patient in aftercare planning with referrals and resources, Increase social support, Increase ability to appropriately verbalize feelings, Increase emotional regulation, Facilitate acceptance of mental health diagnosis and concerns, Facilitate patient progression through stages of change regarding substance use diagnoses and concerns, Identify triggers associated with mental health/substance abuse issues, and Increase skills for wellness and recovery  Therapeutic Interventions: Assess for all discharge needs, 1 to 1 time with Social worker, Explore available resources and  support systems, Assess for adequacy in community support network, Educate family and significant other(s) on suicide prevention, Complete Psychosocial Assessment, Interpersonal group therapy.  Evaluation of Outcomes: Adequate for Discharge  Progress in Treatment: Attending groups: No. Participating in groups: No. Taking medication as prescribed: Yes. Toleration medication: Yes. Family/Significant other contact made: No, will contact:  once permission has been granted Patient understands diagnosis: No. Discussing patient identified problems/goals with staff: Yes. Medical problems stabilized or resolved: Yes. Denies suicidal/homicidal ideation: Yes. Issues/concerns per patient self-inventory: No. Other: none   New problem(s) identified: 12/14/24 no new changes   New Short Term/Long Term Goal(s):Update 12/14/2024 no new changes  Update 12/19/2024: No changes at this time.  Update 12/25/2023: No changes at this time. Update 12/31/2023: No changes at this time.   Patient Goals:    I haven't set any goals for the hospital yet. I don't need psychiatric help 10/23/24 Update: No changes at this time. 10/28/24 Update: No changes at this time. Update 11/03/24: No changes at this time Update 11/09/24: No changes at this time Update 11/14/24: No changes at this time2/21/25 Update: No changes at this time. Update 11/29/2024: No changes at this time.   Update 12/04/2024: No changes at this time. Update 12/09/2024: No changes at this time.   12/14/24 no new changes  Update 12/19/2024: No changes at this time. Update 12/25/2023: No changes at this time. Update 12/31/2023: No changes at this time.   Discharge Plan or Barriers: CSW will assist with appropriate discharge plan 10/23/24 Update: No changes at this time. 10/28/24 Update: CSW to continue to meet with DSS to engage in safe discharge planning and connect patient with appropriate resources. Update 11/03/24: No changes at this time Update 11/09/24: No changes  at this time Update 11/14/24: APS drafting a petition for guardianship, awaiting updates regarding scheduled court date at this time. APS has received FL2 to begin placement search. 11/24/24 Update: No changes at this time.  Update 11/29/2024: Placement continues to be sought at this time.  Update 12/04/2024: No updates on placement search at this time Update 12/09/2024: No changes at this time.  Update:12/14/24 no new changes  Update 12/19/2024: No changes at this time. Update 12/25/2023: APS continues to look for placement for patient. No plan identified. Update 12/31/2023: No changes at this time.     Reason for Continuation of Hospitalization: Delusions  12/14/24 no changes   Estimated Length of Stay: Update 12/14/2024: TBD  Update 12/19/2024: TBD Update 12/25/2023: TBD Update 12/31/2023: TBD   Last 3 [place] Suicide Severity Risk Score: Flowsheet Row Admission (Current) from 10/17/2024 in [facility] from 10/14/2024 in [facility] ED to Hosp-Admission (Discharged) from 03/13/2024 in [facility] [facility]  C-SSRS RISK CATEGORY No Risk  No Risk No Risk    Last PHQ 2/9 Scores:     No data to display          Scribe for Treatment Team: [name], [place] 12/30/2024 12:02 PM

## 2024-12-30 NOTE — Progress Notes (Signed)
 Behavior:  Pleasant and cooperative.  Present in the milieu.   Psych assessment: Denies SI/HI and AVH.    Group attendance:  0/1  Medication/ PRNs: Compliant.    Pain: Denies.  15 min checks in place for safety.

## 2024-12-31 DIAGNOSIS — F200 Paranoid schizophrenia: Secondary | ICD-10-CM | POA: Diagnosis not present

## 2024-12-31 DIAGNOSIS — F0390 Unspecified dementia without behavioral disturbance: Secondary | ICD-10-CM | POA: Diagnosis not present

## 2024-12-31 NOTE — Group Note (Signed)
 Recreation Therapy Group Note   Group Topic:Leisure Education  Group Date: 12/31/2024 Start Time: 1400 End Time: 1440 Facilitators: Celestia Jeoffrey BRAVO, LRT, CTRS Location: Dayroom  Group Description: Bingo. Patients played multiple rounds of bingo. LRT and pts discussed the definition of leisure, things they do in their free time outside of the hospital, and how bingo is also a leisure activity. Pts received a coloring book, word search book, or journal as a prize.     Goal Area(s) Addressed:    Patient will identify a current leisure interest.    Patient will learn the definition of leisure.   Patient will have the opportunity to try a new leisure activity.   Patient will communicate with peers and LRT.    Affect/Mood: Appropriate and Flat   Participation Level: Active and Engaged   Participation Quality: Independent   Behavior: Calm and Cooperative   Speech/Thought Process: Coherent   Insight: Fair   Judgement: Fair    Modes of Intervention: Competitive Play   Patient Response to Interventions:  Receptive   Education Outcome:  Acknowledges education   Clinical Observations/Individualized Feedback: Ariani was active in their participation of session activities and group discussion. Pt chose not to get a stress ball. Pt interacted well with LRT and peers duration of session.    Plan: Continue to engage patient in RT group sessions 2-3x/week.   Jeoffrey BRAVO Celestia, LRT, CTRS 12/31/2024 2:54 PM

## 2024-12-31 NOTE — Progress Notes (Signed)
 Pt denies SI/HI/AVH. She denies pain this shift. She slept well through the night. Q 15 min safety checks in place.   12/30/24 2107  Psych Admission Type (Psych Patients Only)  Admission Status Voluntary  Psychosocial Assessment  Patient Complaints None  Eye Contact Fair  Facial Expression Other (Comment) (WNL)  Affect Appropriate to circumstance  Speech Logical/coherent;Soft  Interaction Minimal  Motor Activity Slow  Appearance/Hygiene Unremarkable  Behavior Characteristics Appropriate to situation  Mood Pleasant  Thought Process  Coherency WDL  Content WDL  Delusions None reported or observed  Perception WDL  Hallucination None reported or observed  Judgment Impaired  Confusion None  Danger to Self  Current suicidal ideation? Denies  Danger to Others  Danger to Others None reported or observed

## 2024-12-31 NOTE — Group Note (Signed)
 LCSW Group Therapy Note    Group Date: 12/31/2024 Start Time: 1300 End Time: 1400   Type of Therapy and Topic: Group Therapy: Body Image  Participation Level:  Active  Description of Group:  Patients were educated about body image and asked to think about whether they have a healthy or unhealthy body image. Patients were led in a discussion about factors that contribute to body image, both internal and external. Patients were asked to discuss strengths of the human body outside of appearance, such as being able to fight off diseases and provide stress relief. Lastly, patients were asked to identify one way in which they appreciate their own body outside of appearance.   Therapeutic Goals:   1. Patient will differentiate between a healthy and unhealthy body image. 2. Patient will identify what contributes to body image 3. Patient will discuss the strengths of the human body. 4. Patient will identify a positive attribute of their body outside of physical appearance.  Summary of Patient Progress:   Patients were educated about body image and asked to think about whether they have a healthy or unhealthy body image. Patients were led in a discussion about factors that contribute to body image, both internal and external. Patients were asked to discuss strengths of the human body outside of appearance, such as being able to fight off diseases and provide stress relief. Lastly, patients were asked to identify one way in which they appreciate their own body outside of appearance.     Therapeutic Modalities: Cognitive Behavioral Therapy; Solution-Focused Therapy  Shaiann Mcmanamon M Myleah Cavendish, LCSWA 12/31/2024  2:01 PM

## 2024-12-31 NOTE — BHH Counselor (Signed)
 CSW sent message to [name] [phone]) to request updates on placement efforts made by DSS.   CSW awaits updates at this time.   Lum Croft, MSW, [place] 12/31/2024 12:41 PM

## 2024-12-31 NOTE — Plan of Care (Signed)
  Problem: Education: Goal: Emotional status will improve Outcome: Progressing   Problem: Activity: Goal: Interest or engagement in activities will improve Outcome: Progressing   Problem: Health Behavior/Discharge Planning: Goal: Compliance with treatment plan for underlying cause of condition will improve Outcome: Progressing

## 2024-12-31 NOTE — Progress Notes (Signed)
 Behavior: Pleasant and cooperative.  Present in the milieu.     Psych assessment:  Denies SI/HI and AVH.    Group attendance:  2/3  Medication/ PRNs: Compliant.  Declined lidocaine  patch.  PO PRN pain medication given as ordered.   Pain: 7/10 in back  15 min checks in place for safety.
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
## 2024-12-31 NOTE — Group Note (Signed)
 Date:  12/31/2024 Time:  11:02 AM  Group Topic/Focus:  Healthy Communication:   The focus of this group is to discuss communication, barriers to communication, as well as healthy ways to communicate with others.    Participation Level:  Did Not Attend   [name] 12/31/2024, 11:02 AM

## 2024-12-31 NOTE — Group Note (Signed)
 Date:  12/31/2024 Time:  9:19 PM  Group Topic/Focus:  Wrap-Up Group:   The focus of this group is to help patients review their daily goal of treatment and discuss progress on daily workbooks.    Participation Level:  Active  Participation Quality:  Appropriate  Affect:  Appropriate  Cognitive:  Appropriate  Insight: Appropriate  Engagement in Group:  Engaged  Modes of Intervention:  Discussion  Additional Comments:    Shari [name] Bunker 12/31/2024, 9:19 PM

## 2024-12-31 NOTE — Plan of Care (Signed)
   Problem: Education: Goal: Emotional status will improve Outcome: Progressing Goal: Mental status will improve Outcome: Progressing Goal: Verbalization of understanding the information provided will improve Outcome: Progressing   Problem: Activity: Goal: Interest or engagement in activities will improve Outcome: Progressing Goal: Sleeping patterns will improve Outcome: Progressing
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
## 2025-01-01 MED ORDER — LIDOCAINE 5 % EX PTCH: 1.0000 | MEDICATED_PATCH | Freq: Every day | CUTANEOUS | Status: AC | PRN

## 2025-01-01 NOTE — Group Note (Signed)
 Date:  01/01/2025 Time:  11:20 AM  Group Topic/Focus:  Recovery Goals:   The focus of this group is to identify appropriate goals for recovery and establish a plan to achieve them.    Participation Level:  Did Not Attend   Shari [name] 01/01/2025, 11:20 AM

## 2025-01-01 NOTE — Progress Notes (Signed)
" °   01/01/25 1300  Psych Admission Type (Psych Patients Only)  Admission Status Voluntary  Psychosocial Assessment  Patient Complaints None  Eye Contact Fair  Facial Expression Animated  Affect Appropriate to circumstance  Speech Logical/coherent  Interaction Minimal  Motor Activity Slow  Appearance/Hygiene Unremarkable  Behavior Characteristics Cooperative  Mood Pleasant  Thought Process  Coherency WDL  Content WDL  Delusions None reported or observed  Perception WDL  Hallucination None reported or observed  Judgment Impaired  Confusion None  Danger to Self  Current suicidal ideation? Denies  Danger to Others  Danger to Others None reported or observed    "

## 2025-01-01 NOTE — Group Note (Signed)
 Date:  01/01/2025 Time:  9:02 PM  Group Topic/Focus:  Wellness Toolbox:   The focus of this group is to discuss various aspects of wellness, balancing those aspects and exploring ways to increase the ability to experience wellness.  Patients will create a wellness toolbox for use upon discharge.    Participation Level:  Active  Participation Quality:  Appropriate  Affect:  Appropriate  Cognitive:  Appropriate  Insight: Good  Engagement in Group:  Engaged  Modes of Intervention:  Discussion  Additional Comments:    Shari [name] 01/01/2025, 9:02 PM

## 2025-01-01 NOTE — Progress Notes (Signed)
 [facility] MD Progress Note  Patient is a 69 year old female with a history of Schizoaffective Disorder, Bipolar Type who presents via [facility] under IVC, initiated by CM, Falencio with re-entry program, to [facility] for assessment. Per IVC, [name] has been diagnosed with Schizophrenia and is refusing to take medication. [name] isn't sleeping. Residents report her screaming and yelling all night long.  [name] responding to internal stimuli.  [name] verbally aggressive.  The respondent has locked all of the residents inside of the transitional housing home.  They could not leave the home which prompted the IVC.  Residents did not have access to food, the restroom or anything as a result of being locked in.  [name] is currently under Adult protective services care. Patient is admitted to [facility] unit with Q15 min safety monitoring. Multidisciplinary team approach is offered. Medication management; group/milieu therapy is offered.   The patient remains psychiatrically stable at this time. [name] awaiting placement, and continued inpatient care is medically necessary primarily due to disposition considerations rather than acute psychiatric decompensation.  Subjective:  Chart reviewed, case discussed in multidisciplinary meeting, patient seen during rounds.   On interview patient is noted to be resting in bed.  She offers no complaints.  She denies SI/HI/plan.  [name] not able to recall any visitations recently from the APS caseworker.  She did acknowledge that the APS is working on alternate placement.  Patient is taking her medications with no reported side effects.  She denies any physical problems today   Psychiatric History: see h&P Family History:  Family History  Problem Relation Age of Onset   Diabetes Mother    CAD Mother    Lung cancer Father    Social History:  Social History   Substance and Sexual Activity  Alcohol Use Yes   Comment: beer occasionally      Social History   Substance and Sexual Activity  Drug Use No    Social History   Socioeconomic History   Marital status: Widowed    Spouse name: Not on file   Number of children: Not on file   Years of education: Not on file   Highest education level: Not on file  Occupational History   Not on file  Tobacco Use   Smoking status: Every Day    Current packs/day: 0.50    Average packs/day: 0.5 packs/day for 40.0 years (20.0 ttl pk-yrs)    Types: Cigarettes   Smokeless tobacco: Never  Vaping Use   Vaping status: Never Used  Substance and Sexual Activity   Alcohol use: Yes    Comment: beer occasionally   Drug use: No   Sexual activity: Not Currently    Birth control/protection: Post-menopausal  Other Topics Concern   Not on file  Social History Narrative   Not on file   Social Drivers of Health   Tobacco Use: High Risk (11/02/2024)   Patient History    Smoking Tobacco Use: Every Day    Smokeless Tobacco Use: Never    Passive Exposure: Not on file  Financial Resource Strain: Not on file  Food Insecurity: No Food Insecurity (10/17/2024)   Epic    Worried About Programme Researcher, Broadcasting/film/video in the Last Year: Never true    Ran Out of Food in the Last Year: Never true  Transportation Needs: No Transportation Needs (10/17/2024)   Epic    Lack of Transportation (Medical): No    Lack of Transportation (Non-Medical): No  Recent Concern: Transportation Needs - Unmet Transportation Needs (10/14/2024)   Epic    Lack of Transportation (Medical): Yes    Lack of Transportation (Non-Medical): Yes  Physical Activity: Not on file  Stress: Not on file  Social Connections: Unknown (10/17/2024)   Social Connection and Isolation Panel    Frequency of Communication with Friends and Family: Patient unable to answer    Frequency of Social Gatherings with Friends and Family: Patient unable to answer    Attends Religious Services: Patient unable to answer    Active Member of Clubs or  Organizations: Patient declined    Attends Banker Meetings: Patient unable to answer    Marital Status: Widowed  Depression (PHQ2-9): Not on file  Alcohol Screen: Low Risk (10/17/2024)   Alcohol Screen    Last Alcohol Screening Score (AUDIT): 0  Housing: Low Risk (10/17/2024)   Epic    Unable to Pay for Housing in the Last Year: No    Number of Times Moved in the Last Year: 0    Homeless in the Last Year: No  Utilities: Not At Risk (10/17/2024)   Epic    Threatened with loss of utilities: No  Health Literacy: Not on file   Past Medical History:  Past Medical History:  Diagnosis Date   Eye globe prosthesis    GERD (gastroesophageal reflux disease)    History of blood transfusion 1973   related to abscess burst in my stomach   Hyperlipemia    Hyperlipidemia    Hypertension    Osteoarthritis of back    Lowerback    SVD (spontaneous vaginal delivery)    x 1, baby died at 2 wks of age   Type II diabetes mellitus (HCC)     Past Surgical History:  Procedure Laterality Date   APPENDECTOMY     COLONOSCOPY     DILATATION & CURETTAGE/HYSTEROSCOPY WITH MYOSURE N/A 02/25/2015   Procedure: DILATATION & CURETTAGE/HYSTEROSCOPY WITH MYOSURE;  Surgeon: Truman Corona, MD;  Location: [facility];  Service: Gynecology;  Laterality: N/A;   DILATION AND CURETTAGE OF UTERUS     ENUCLEATION Right 03/16/2017   ENUCLEATION Right 03/16/2017   Procedure: ENUCLEATION RIGHT EYE;  Surgeon: Loyd Kathryne Palm, MD;  Location: [facility] OR;  Service: Ophthalmology;  Laterality: Right;   EYE SURGERY     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6, 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   EYE SURGERY Right ~ 1974   S/P initial eye injury; scissors stuck in my eye   LAPAROSCOPIC CHOLECYSTECTOMY     SHOULDER ARTHROSCOPY WITH ROTATOR CUFF REPAIR Left    wire stitches per patient   SHOULDER ARTHROSCOPY WITH ROTATOR CUFF REPAIR Right     Current Medications: Current Facility-Administered Medications  Medication Dose Route Frequency  Provider Last Rate Last Admin   acetaminophen  (TYLENOL ) tablet 650 mg  650 mg Oral Q6H PRN Tex Drilling, NP   650 mg at 12/07/24 2126   alum & mag hydroxide-simeth (MAALOX/MYLANTA) 200-200-20 MG/5ML suspension 30 mL  30 mL Oral Q4H PRN Nkwenti, Drilling, NP       cloNIDine  (CATAPRES ) tablet 0.1 mg  0.1 mg Oral BID PRN Tex Drilling, NP       divalproex  (DEPAKOTE ) DR tablet 1,500 mg  1,500 mg Oral QHS Alzena Gerber, MD   1,500 mg at 12/07/24 2126   divalproex  (DEPAKOTE ) DR tablet 500 mg  500 mg Oral q AM Woodroe Vogan, MD   500 mg at 12/08/24 0807   losartan  (COZAAR ) tablet  25 mg  25 mg Oral Daily Tex Drilling, NP   25 mg at 12/08/24 9071   magnesium  hydroxide (MILK OF MAGNESIA) suspension 30 mL  30 mL Oral Daily PRN Tex Drilling, NP       menthol  (CEPACOL) lozenge 3 mg  1 lozenge Oral PRN Bobbitt, Shalon E, NP   3 mg at 12/08/24 9065   methimazole  (TAPAZOLE ) tablet 10 mg  10 mg Oral Daily Nkwenti, Doris, NP   10 mg at 12/08/24 9071   OLANZapine  (ZYPREXA ) injection 5 mg  5 mg Intramuscular TID PRN Tex Drilling, NP       OLANZapine  (ZYPREXA ) injection 5 mg  5 mg Intramuscular TID PRN Tex Drilling, NP       OLANZapine  (ZYPREXA ) tablet 20 mg  20 mg Oral QHS Ottilia Pippenger, MD   20 mg at 12/07/24 2127   OLANZapine  zydis (ZYPREXA ) disintegrating tablet 5 mg  5 mg Oral TID PRN Tex Drilling, NP       OLANZapine  zydis (ZYPREXA ) disintegrating tablet 5 mg  5 mg Oral TID PRN Tex Drilling, NP       paliperidone  (INVEGA ) 24 hr tablet 3 mg  3 mg Oral Daily Shrivastava, Aryendra, MD   3 mg at 12/08/24 9071   pantoprazole  (PROTONIX ) EC tablet 40 mg  40 mg Oral Daily Nkwenti, Doris, NP   40 mg at 12/08/24 9071   traZODone  (DESYREL ) tablet 50 mg  50 mg Oral QHS PRN Tex Drilling, NP   50 mg at 12/06/24 2109    Lab Results:  No results found for this or any previous visit (from the past 48 hours).       Blood Alcohol level:  Lab Results  Component Value Date   [facility] <15 10/14/2024   ETH  <10 07/02/2022    Metabolic Disorder Labs: Lab Results  Component Value Date   HGBA1C 5.5 10/14/2024   MPG 111.15 10/14/2024   MPG 137 01/06/2022   Lab Results  Component Value Date   PROLACTIN 5.8 10/14/2024   Lab Results  Component Value Date   CHOL 167 10/14/2024   TRIG 68 10/14/2024   HDL 60 10/14/2024   CHOLHDL 2.8 10/14/2024   VLDL 14 10/14/2024   LDLCALC 93 10/14/2024   LDLCALC 73 03/13/2024    Physical Findings: AIMS:  , ,  ,  ,    CIWA:    COWS:      Psychiatric Specialty Exam:  Presentation  General Appearance:  Appropriate for Environment  Eye Contact: Fleeting  Speech: Normal Rate  Speech Volume: Decreased    Mood and Affect  Mood:fine  Affect: Flat   Thought Process  Thought Processes: impoverished  Orientation:Partial  Thought Content: Chronic delusions Hallucinations: Denies  Ideas of Reference:Delusions; Paranoia  Suicidal Thoughts: Denies  Homicidal Thoughts: Denies   Sensorium  Memory: Immediate Fair; Recent Fair  Judgment: Impaired  Insight: Shallow   Executive Functions  Concentration: Fair  Attention Span: Fair  Recall: Poor  Fund of Knowledge: Fair  Language: Fair   Psychomotor Activity  Psychomotor Activity: No data recorded  Musculoskeletal: Strength & Muscle Tone: within normal limits Gait & Station: normal Assets  Assets: Manufacturing Systems Engineer; Desire for Improvement; Social Support    Physical Exam: Physical Exam Vitals and nursing note reviewed.    ROS Blood pressure (!) 152/69, pulse 98, temperature 98.3 F (36.8 C), resp. rate 18, height 5' 8 (1.727 m), weight 48.3 kg, SpO2 100%. Body mass index is 16.19 kg/m.  Diagnosis:  Principal Problem:   Schizophrenia, paranoid (HCC) Major neurocognitive disorder-slums total score of 13/30   Treatment Plan Summary: APS took legal guardianship and  working further placement at appropriate level of care  Safety and  Monitoring:             -- Involuntary admission to inpatient psychiatric unit for safety, stabilization and treatment             -- Daily contact with patient to assess and evaluate symptoms and progress in treatment             -- Patient's case to be discussed in multi-disciplinary team meeting             -- Observation Level: q15 minute checks             -- Vital signs:  q12 hours             -- Precautions: suicide, elopement, and assault   2. Psychiatric Diagnoses and Treatment:  Check Depakote  levels 11/20/2024-60 Invega  6 mg           Depakote  500 mg every morning and 1500 mg   Zyprexa  20 mg nightly  -- The risks/benefits/side-effects/alternatives to this medication were discussed in detail with the patient and time was given for questions. The patient consents to medication trial.                -- Metabolic profile and EKG monitoring obtained while on an atypical antipsychotic (BMI: Lipid Panel: HbgA1c: QTc:)              -- Encouraged patient to participate in unit milieu and in scheduled group therapies   Occupational Therapy recommendations If plan is discharge home, recommend the following:   Direct supervision/assist for medications management;Direct supervision/assist for financial management;Assist for transportation;Assistance with cooking/housework    Ms Jarnagin was seen for OT treatment on this date. Upon arrival to room pt in dayroom, agreeable to tx. The SLUMS is a 30-point screening questionnaire that tests orientation, memory, attention, problem solving, and executive function. Pt scored a 13/30 indicating Dementia. Of note, it is not within occupational therapy scope of practice to diagnose cognitive impairments, this screen indicates need for further testing. Pt with noted impairments in memory and problem solving limiting ability to participate functionally in medication management, bill management, and safety cooking. Will continue to follow POC. Continue to  recommend SUPERVISION for safety with iADLs.    [facility] Mental Status Examination Orientation: 2/3  Calculations: 0/3 Naming animals: 2/3 Patient named 14 animals (0 points is 0-4 animals; 1 is 5-9 animals; 2 is 10-14 animals; 3 is 15+ animals) Recall: 3/5  Attention: 1/2 Clock drawing: 1/4 Visual Processing: 2/2 Paragraph Memory: 2/8  Total: 13/30;  Given that patient has completed high school, this score falls in the dementia range.   4. Discharge Planning:   -- Social work and case management to assist with discharge planning and identification of hospital follow-up needs prior to discharge  -- Estimated LOS: 3-4 days  Millie JONELLE Manners, MD 12/08/2024, 5:46 PM

## 2025-01-01 NOTE — Plan of Care (Signed)
  Problem: Activity: Goal: Sleeping patterns will improve Outcome: Progressing   

## 2025-01-02 DIAGNOSIS — F200 Paranoid schizophrenia: Secondary | ICD-10-CM | POA: Diagnosis not present

## 2025-01-02 DIAGNOSIS — F0390 Unspecified dementia without behavioral disturbance: Secondary | ICD-10-CM | POA: Diagnosis not present

## 2025-01-02 MED ORDER — ENSURE PLUS HIGH PROTEIN PO LIQD
237.0000 mL | Freq: Two times a day (BID) | ORAL | Status: AC
  Administered 2025-01-02 – 2025-01-10 (×15): 237 mL via ORAL

## 2025-01-02 MED ORDER — ADULT MULTIVITAMIN W/MINERALS CH
1.0000 | ORAL_TABLET | Freq: Every day | ORAL | Status: AC
  Administered 2025-01-02 – 2025-01-10 (×9): 1 via ORAL
  Filled 2025-01-02 (×9): qty 1

## 2025-01-02 NOTE — Progress Notes (Addendum)
 NUTRITION ASSESSMENT  Pt identified as at risk on the Malnutrition Screen Tool  INTERVENTION:  -MVI with minerals daily -Continue regular diet -Ensure Plus High Protein po BID, each supplement provides 350 kcal and 20 grams of protein   NUTRITION DIAGNOSIS: Unintentional weight loss related to sub-optimal intake as evidenced by pt report.   Goal: Pt to meet >/= 90% of their estimated nutrition needs.  Monitor:  PO intake  Assessment:   69 year old female with a history of Schizoaffective Disorder, Bipolar Type who presented under IVC.   Noted patient was refusing medications PTA.   Patient sitting in dayroom, watching TV at time of visit. Patient with flat affect, but reports feeling okay today. Patient laments about prolonged stay and reports I'm ready to get out of here. She expressed understanding of continued hospital stay.   Patient reports fair appetite. [name] on a regular diet. Noted patient with poor dentition. She shares that she is able to eat most foods despite missing teeth. RD offered to downgrade diet for ease of intake, however, patient declined, stating that she wants to have the widest variety of options. Patinet reports she was drinking Boost supplements PTA and is willing to drink them.   Patient complains of reflux, but says this usually occurs when she takes her medications whole at the same time. Noted she has pepcid  ordered. [name] willing to take pills one at a time or crush pills if applicable. Case discussed with RN, who reports consult was placed due to GERD symptoms. She reports she thinks this is because of food choices, as patient eats chicken tenders daily at lunch and dinner. RD informed RN of findings.  No weight loss noted over the past year.   Per CSW notes, potential placement at [facility]. Per psych notes, patient is psychiatrically stable and awaiting placement.   Medications reviewed and include pepcid , depakote , and carafate .   Labs  reviewed.    69 y.o. female  Height: Ht Readings from Last 1 Encounters:  10/17/24 5' 8 (1.727 m)    Weight: Wt Readings from Last 1 Encounters:  10/17/24 48.3 kg    Weight Hx: Wt Readings from Last 10 Encounters:  10/17/24 48.3 kg  03/13/24 45.4 kg  03/06/23 55.8 kg  11/03/22 59 kg  07/02/22 64.8 kg  06/15/22 64.9 kg  05/02/22 65 kg  01/20/22 61.7 kg  01/05/22 53.5 kg  12/28/21 59 kg    BMI:  Body mass index is 16.19 kg/m. Pt meets criteria for underweight based on current BMI.  Estimated Nutritional Needs: Kcal: 25-30 kcal/kg Protein: > 1 gram protein/kg Fluid: 1 ml/kcal  Diet Order:  Diet Order             Diet regular Room service appropriate? Yes; Fluid consistency: Thin  Diet effective now                  Pt is also offered choice of unit snacks mid-morning and mid-afternoon.  Pt is eating as desired.   Lab results and medications reviewed.   Margery ORN, RD, LDN, CDCES Registered Dietitian III Certified Diabetes Care and Education Specialist If unable to reach this RD, please use RD Inpatient group chat on secure chat between hours of 8am-4 pm daily

## 2025-01-02 NOTE — Plan of Care (Signed)
  Problem: Education: Goal: Emotional status will improve Outcome: Progressing Goal: Mental status will improve Outcome: Progressing   Problem: Coping: Goal: Ability to verbalize frustrations and anger appropriately will improve Outcome: Progressing   

## 2025-01-02 NOTE — Group Note (Signed)
 Date:  01/02/2025 Time:  9:46 AM  Group Topic/Focus:  Movement Therapy. Morning Stretch with Christabella Alvira.    Participation Level:  Did Not Attend    [name] 01/02/2025, 9:46 AM

## 2025-01-02 NOTE — Group Note (Signed)
 Recreation Therapy Group Note   Group Topic:Stress Management  Group Date: 01/02/2025 Start Time: 1410 End Time: 1440 Facilitators: Celestia Jeoffrey BRAVO, LRT, CTRS Location: Dayroom  Group Description: PMR (Progressive Muscle Relaxation). LRT educates patients on what PMR is and the benefits that come from it. Patients are asked to sit with their feet flat on the floor while sitting up and all the way back in their chair, if possible. LRT and pts follow a prompt through a speaker that requires you to tense and release different muscles in their body and focus on their breathing. During session, lights are off and soft music is being played. Pts are given a stress ball to use if needed.   Goal Area(s) Addressed:  Patients will be able to describe progressive muscle relaxation.  Patient will practice using relaxation technique. Patient will identify a new coping skill.  Patient will follow multistep directions to reduce anxiety and stress.   Affect/Mood: N/A   Participation Level: Did not attend    Clinical Observations/Individualized Feedback: Patient did not attend.  Plan: Continue to engage patient in RT group sessions 2-3x/week.   Jeoffrey BRAVO Celestia, LRT, CTRS 01/02/2025 4:22 PM

## 2025-01-02 NOTE — Group Note (Signed)
 Date:  01/02/2025 Time:  8:39 PM  Group Topic/Focus:  Wrap-Up Group:   The focus of this group is to help patients review their daily goal of treatment and discuss progress on daily workbooks.    Participation Level:  Active  Participation Quality:  Appropriate  Affect:  Appropriate  Cognitive:  Appropriate  Insight: Appropriate  Engagement in Group:  Engaged  Modes of Intervention:  Discussion  Additional Comments:    Shari [name] 01/02/2025, 8:39 PM

## 2025-01-02 NOTE — B[facility] Counselor (Signed)
 CSW contacted pt's caseworker [name], [phone] to see if there are any updates regarding placement.   Arch reports that there is no updates at this time, Arch asks if [facility] had come to assess pt yet. CSW informed Arch that they had not yet come to assess pt.   CSW reached out directly to Carrington Kitty [phone]) at [facility] directly to ask when they plan to come see pt.   CSW awaits respons from [place] at this time.   Lum Croft, MSW, [place] 01/02/2025 12:46 PM

## 2025-01-02 NOTE — Progress Notes (Signed)
" °   01/01/25 2300  Psych Admission Type (Psych Patients Only)  Admission Status Voluntary  Psychosocial Assessment  Patient Complaints None  Eye Contact Fair  Facial Expression Animated  Affect Appropriate to circumstance  Speech Logical/coherent  Interaction Minimal  Motor Activity Slow  Appearance/Hygiene Unremarkable  Behavior Characteristics Cooperative  Mood Pleasant  Thought Process  Coherency WDL  Content WDL  Delusions None reported or observed  Perception WDL  Hallucination None reported or observed  Judgment Impaired  Confusion None  Danger to Self  Current suicidal ideation? Denies  Danger to Others  Danger to Others None reported or observed    Estimated Sleeping Duration (Last 24 Hours): 6.25-7.75 hours  "

## 2025-01-02 NOTE — B[facility] Counselor (Signed)
 Pt's potential placement, [facility], would like an updated FL2 and progress notes.   CSW will send updated FL2 and progress notes via email.   Lum Croft, MSW, [place] 01/02/2025 2:13 PM

## 2025-01-02 NOTE — [facility] Group Notes (Signed)
 Spirituality Group   Description: Participant directed exploration of values, beliefs and meaning.  Following a brief framework of chaplains role and ground rules of group behavior, participants are invited to share concerns or questions that engage spiritual life. Emphasis placed on common themes and shared experiences and ways to make meaning and clarify living into ones values.   *Use of conversation activity questions to foster deeper connection   Theory/Process/Goal: Utilize the theoretical framework of group therapy established by Celena Kite, Relational Cultural Theory and Rogerian approaches to facilitate relational empathy and use of the here and now to foster reflection, self-awareness, and sharing.   Observations: Manaia was reserved but still an active participant in the group discussion.  Tida Saner L. Delores HERO.Div

## 2025-01-02 NOTE — Progress Notes (Signed)
 [facility] MD Progress Note  Patient is a 69 year old female with a history of Schizoaffective Disorder, Bipolar Type who presents via [facility] under IVC, initiated by CM, Falencio with re-entry program, to [facility] for assessment. Per IVC, [name] has been diagnosed with Schizophrenia and is refusing to take medication. [name] isn't sleeping. Residents report her screaming and yelling all night long.  [name] responding to internal stimuli.  [name] verbally aggressive.  The respondent has locked all of the residents inside of the transitional housing home.  They could not leave the home which prompted the IVC.  Residents did not have access to food, the restroom or anything as a result of being locked in.  [name] is currently under Adult protective services care. Patient is admitted to [facility] unit with Q15 min safety monitoring. Multidisciplinary team approach is offered. Medication management; group/milieu therapy is offered.   The patient remains psychiatrically stable at this time. [name] awaiting placement, and continued inpatient care is medically necessary primarily due to disposition considerations rather than acute psychiatric decompensation.  Subjective:  Chart reviewed, case discussed in multidisciplinary meeting, patient seen during rounds.   Patient is noted to be resting in bed.  She reports difficulty in swallowing.  She reports one of the syrup makes her choke.  She denies depression or anxiety.  She denies SI/HI/plan and denies hallucinations.  She remains delusional about going back to her own house.  [name] unable to process that guardianship is being appointed to care for her.  Per nursing patient reports heartburn.   Psychiatric History: see h&P Family History:  Family History  Problem Relation Age of Onset   Diabetes Mother    CAD Mother    Lung cancer Father    Social History:  Social History   Substance and Sexual Activity  Alcohol Use Yes    Comment: beer occasionally     Social History   Substance and Sexual Activity  Drug Use No    Social History   Socioeconomic History   Marital status: Widowed    Spouse name: Not on file   Number of children: Not on file   Years of education: Not on file   Highest education level: Not on file  Occupational History   Not on file  Tobacco Use   Smoking status: Every Day    Current packs/day: 0.50    Average packs/day: 0.5 packs/day for 40.0 years (20.0 ttl pk-yrs)    Types: Cigarettes   Smokeless tobacco: Never  Vaping Use   Vaping status: Never Used  Substance and Sexual Activity   Alcohol use: Yes    Comment: beer occasionally   Drug use: No   Sexual activity: Not Currently    Birth control/protection: Post-menopausal  Other Topics Concern   Not on file  Social History Narrative   Not on file   Social Drivers of Health   Tobacco Use: High Risk (11/02/2024)   Patient History    Smoking Tobacco Use: Every Day    Smokeless Tobacco Use: Never    Passive Exposure: Not on file  Financial Resource Strain: Not on file  Food Insecurity: No Food Insecurity (10/17/2024)   Epic    Worried About Programme Researcher, Broadcasting/film/video in the Last Year: Never true    Ran Out of Food in the Last Year: Never true  Transportation Needs: No Transportation Needs (10/17/2024)   Epic    Lack of Transportation (Medical): No  Lack of Transportation (Non-Medical): No  Recent Concern: Transportation Needs - Unmet Transportation Needs (10/14/2024)   Epic    Lack of Transportation (Medical): Yes    Lack of Transportation (Non-Medical): Yes  Physical Activity: Not on file  Stress: Not on file  Social Connections: Unknown (10/17/2024)   Social Connection and Isolation Panel    Frequency of Communication with Friends and Family: Patient unable to answer    Frequency of Social Gatherings with Friends and Family: Patient unable to answer    Attends Religious Services: Patient unable to answer    Active  Member of Clubs or Organizations: Patient declined    Attends Banker Meetings: Patient unable to answer    Marital Status: Widowed  Depression (PHQ2-9): Not on file  Alcohol Screen: Low Risk (10/17/2024)   Alcohol Screen    Last Alcohol Screening Score (AUDIT): 0  Housing: Low Risk (10/17/2024)   Epic    Unable to Pay for Housing in the Last Year: No    Number of Times Moved in the Last Year: 0    Homeless in the Last Year: No  Utilities: Not At Risk (10/17/2024)   Epic    Threatened with loss of utilities: No  Health Literacy: Not on file   Past Medical History:  Past Medical History:  Diagnosis Date   Eye globe prosthesis    GERD (gastroesophageal reflux disease)    History of blood transfusion 1973   related to abscess burst in my stomach   Hyperlipemia    Hyperlipidemia    Hypertension    Osteoarthritis of back    Lowerback    SVD (spontaneous vaginal delivery)    x 1, baby died at 2 wks of age   Type II diabetes mellitus (HCC)     Past Surgical History:  Procedure Laterality Date   APPENDECTOMY     COLONOSCOPY     DILATATION & CURETTAGE/HYSTEROSCOPY WITH MYOSURE N/A 02/25/2015   Procedure: DILATATION & CURETTAGE/HYSTEROSCOPY WITH MYOSURE;  Surgeon: Truman Corona, MD;  Location: [facility];  Service: Gynecology;  Laterality: N/A;   DILATION AND CURETTAGE OF UTERUS     ENUCLEATION Right 03/16/2017   ENUCLEATION Right 03/16/2017   Procedure: ENUCLEATION RIGHT EYE;  Surgeon: Loyd Kathryne Palm, MD;  Location: [facility] OR;  Service: Ophthalmology;  Laterality: Right;   EYE SURGERY     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6, 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   EYE SURGERY Right ~ 1974   S/P initial eye injury; scissors stuck in my eye   LAPAROSCOPIC CHOLECYSTECTOMY     SHOULDER ARTHROSCOPY WITH ROTATOR CUFF REPAIR Left    wire stitches per patient   SHOULDER ARTHROSCOPY WITH ROTATOR CUFF REPAIR Right     Current Medications: Current Facility-Administered Medications  Medication Dose  Route Frequency Provider Last Rate Last Admin   acetaminophen  (TYLENOL ) tablet 650 mg  650 mg Oral Q6H PRN Tex Drilling, NP   650 mg at 12/07/24 2126   alum & mag hydroxide-simeth (MAALOX/MYLANTA) 200-200-20 MG/5ML suspension 30 mL  30 mL Oral Q4H PRN Tex Drilling, NP       cloNIDine  (CATAPRES ) tablet 0.1 mg  0.1 mg Oral BID PRN Tex Drilling, NP       divalproex  (DEPAKOTE ) DR tablet 1,500 mg  1,500 mg Oral QHS Richie Bonanno, MD   1,500 mg at 12/07/24 2126   divalproex  (DEPAKOTE ) DR tablet 500 mg  500 mg Oral q AM Blessing Zaucha, MD   500 mg at 12/08/24  9192   losartan  (COZAAR ) tablet 25 mg  25 mg Oral Daily Nkwenti, Doris, NP   25 mg at 12/08/24 9071   magnesium  hydroxide (MILK OF MAGNESIA) suspension 30 mL  30 mL Oral Daily PRN Tex Drilling, NP       menthol  (CEPACOL) lozenge 3 mg  1 lozenge Oral PRN Bobbitt, Shalon E, NP   3 mg at 12/08/24 9065   methimazole  (TAPAZOLE ) tablet 10 mg  10 mg Oral Daily Nkwenti, Doris, NP   10 mg at 12/08/24 9071   OLANZapine  (ZYPREXA ) injection 5 mg  5 mg Intramuscular TID PRN Tex Drilling, NP       OLANZapine  (ZYPREXA ) injection 5 mg  5 mg Intramuscular TID PRN Tex Drilling, NP       OLANZapine  (ZYPREXA ) tablet 20 mg  20 mg Oral QHS Sonyia Muro, MD   20 mg at 12/07/24 2127   OLANZapine  zydis (ZYPREXA ) disintegrating tablet 5 mg  5 mg Oral TID PRN Tex Drilling, NP       OLANZapine  zydis (ZYPREXA ) disintegrating tablet 5 mg  5 mg Oral TID PRN Tex Drilling, NP       paliperidone  (INVEGA ) 24 hr tablet 3 mg  3 mg Oral Daily Shrivastava, Aryendra, MD   3 mg at 12/08/24 9071   pantoprazole  (PROTONIX ) EC tablet 40 mg  40 mg Oral Daily Nkwenti, Doris, NP   40 mg at 12/08/24 9071   traZODone  (DESYREL ) tablet 50 mg  50 mg Oral QHS PRN Tex Drilling, NP   50 mg at 12/06/24 2109    Lab Results:  No results found for this or any previous visit (from the past 48 hours).       Blood Alcohol level:  Lab Results  Component Value Date   [facility] <15  10/14/2024   ETH <10 07/02/2022    Metabolic Disorder Labs: Lab Results  Component Value Date   HGBA1C 5.5 10/14/2024   MPG 111.15 10/14/2024   MPG 137 01/06/2022   Lab Results  Component Value Date   PROLACTIN 5.8 10/14/2024   Lab Results  Component Value Date   CHOL 167 10/14/2024   TRIG 68 10/14/2024   HDL 60 10/14/2024   CHOLHDL 2.8 10/14/2024   VLDL 14 10/14/2024   LDLCALC 93 10/14/2024   LDLCALC 73 03/13/2024    Physical Findings: AIMS:  , ,  ,  ,    CIWA:    COWS:      Psychiatric Specialty Exam:  Presentation  General Appearance:  Appropriate for Environment  Eye Contact: Fleeting  Speech: Normal Rate  Speech Volume: Decreased    Mood and Affect  Mood:fine  Affect: Flat   Thought Process  Thought Processes: impoverished  Orientation:Partial  Thought Content: Chronic delusions Hallucinations: Denies  Ideas of Reference:Delusions; Paranoia  Suicidal Thoughts: Denies  Homicidal Thoughts: Denies   Sensorium  Memory: Immediate Fair; Recent Fair  Judgment: Impaired  Insight: Shallow   Executive Functions  Concentration: Fair  Attention Span: Fair  Recall: Poor  Fund of Knowledge: Fair  Language: Fair   Psychomotor Activity  Psychomotor Activity: No data recorded  Musculoskeletal: Strength & Muscle Tone: within normal limits Gait & Station: normal Assets  Assets: Manufacturing Systems Engineer; Desire for Improvement; Social Support    Physical Exam: Physical Exam Vitals and nursing note reviewed.    ROS Blood pressure (!) 152/69, pulse 98, temperature 98.3 F (36.8 C), resp. rate 18, height 5' 8 (1.727 m), weight 48.3 kg, SpO2 100%. Body mass  index is 16.19 kg/m.  Diagnosis: Principal Problem:   Schizophrenia, paranoid (HCC) Major neurocognitive disorder-slums total score of 13/30   Treatment Plan Summary: APS took legal guardianship and  working further placement at appropriate level of  care  Safety and Monitoring:             -- Involuntary admission to inpatient psychiatric unit for safety, stabilization and treatment             -- Daily contact with patient to assess and evaluate symptoms and progress in treatment             -- Patient's case to be discussed in multi-disciplinary team meeting             -- Observation Level: q15 minute checks             -- Vital signs:  q12 hours             -- Precautions: suicide, elopement, and assault   2. Psychiatric Diagnoses and Treatment:  Depakote  levels 11/20/2024-60 Invega  6 mg Depakote  500 mg every morning and 1500 mg  Zyprexa  20 mg nightly  -- The risks/benefits/side-effects/alternatives to this medication were discussed in detail with the patient and time was given for questions. The patient consents to medication trial.                -- Metabolic profile and EKG monitoring obtained while on an atypical antipsychotic (BMI: Lipid Panel: HbgA1c: QTc:)              -- Encouraged patient to participate in unit milieu and in scheduled group therapies   Occupational Therapy recommendations If plan is discharge home, recommend the following:   Direct supervision/assist for medications management;Direct supervision/assist for financial management;Assist for transportation;Assistance with cooking/housework    Ms Halle was seen for OT treatment on this date. Upon arrival to room pt in dayroom, agreeable to tx. The SLUMS is a 30-point screening questionnaire that tests orientation, memory, attention, problem solving, and executive function. Pt scored a 13/30 indicating Dementia. Of note, it is not within occupational therapy scope of practice to diagnose cognitive impairments, this screen indicates need for further testing. Pt with noted impairments in memory and problem solving limiting ability to participate functionally in medication management, bill management, and safety cooking. Will continue to follow POC. Continue to  recommend SUPERVISION for safety with iADLs.    [facility] Mental Status Examination Orientation: 2/3  Calculations: 0/3 Naming animals: 2/3 Patient named 14 animals (0 points is 0-4 animals; 1 is 5-9 animals; 2 is 10-14 animals; 3 is 15+ animals) Recall: 3/5  Attention: 1/2 Clock drawing: 1/4 Visual Processing: 2/2 Paragraph Memory: 2/8  Total: 13/30;  Given that patient has completed high school, this score falls in the dementia range.   4. Discharge Planning:   -- Social work and case management to assist with discharge planning and identification of hospital follow-up needs prior to discharge  -- Estimated LOS: 3-4 days  Millie JONELLE Manners, MD 12/08/2024, 5:46 PM

## 2025-01-02 NOTE — Progress Notes (Signed)
 Patient is a voluntary admission to [facility] awaiting placement.  Patient only complaint today was her issues with GERD.  Dietician consult placed. Discussed with patient food items that exacerbate GERD (fried foods, coffee, etc) and she states understanding. Patient denies SI, HI, AVH, anxiety and depression.  She ambulates with steady gait without the use of a walker. Takes her medications whole.  Will continue to monitor.

## 2025-01-03 DIAGNOSIS — F200 Paranoid schizophrenia: Secondary | ICD-10-CM | POA: Diagnosis not present

## 2025-01-03 DIAGNOSIS — F0390 Unspecified dementia without behavioral disturbance: Secondary | ICD-10-CM | POA: Diagnosis not present

## 2025-01-03 NOTE — Plan of Care (Signed)
  Problem: Education: Goal: Emotional status will improve Outcome: Not Progressing Goal: Mental status will improve Outcome: Not Progressing Goal: Verbalization of understanding the information provided will improve Outcome: Not Progressing   Problem: Coping: Goal: Ability to verbalize frustrations and anger appropriately will improve Outcome: Not Progressing Goal: Ability to demonstrate self-control will improve Outcome: Not Progressing

## 2025-01-03 NOTE — Group Note (Signed)
 Physical/Occupational Therapy Group Note  Group Topic: Neurographic Art  Group Date: 01/03/2025 Start Time: 1315 End Time: 1400 Facilitators: Clive Warren CROME, OT   Group Description: Group participated with Neurographic art activity, using watercolor paints to facilitate creative expression and meditation/relaxation for each individual.  Incorporated bimanual coordination, mental focus, emotional processing, task/command following and relaxation techniques as appropriate.  Patients engaged socially with therapist and other group participants throughout session. Allowed to ask questions as appropriate, and encouraged to identify ways they could use/share their creations with themselves and others.  Therapeutic Goal(s):  Demonstrate ability to independently manipulate utensils required to participate with and complete activity. Demonstrate ability to cognitively focus on task and follow commands necessary for completion. Demonstrate use of art as an outlet for emotional processing and expression. Identify and demonstrate importance of relaxation, neural calming and meditation for improved participation with life groups.  Individual Participation: Pt agreeable to being present for session but passively. She did not participate in the activity itself but intermittently would engage with prompting in group discussion.   Participation Level: Minimal   Participation Quality: Poor, did not engage in activity but was present and intermittently vocalized with prompting    Behavior: Alert and Isolative   Speech/Thought Process: Barely audible   Affect/Mood: Flat   Insight: Impaired and Poor   Judgement: Impaired   Modes of Intervention: Activity, Clarification, Discussion, Education, Exploration, Problem-solving, Rapport Building, Socialization, and Support  Patient Response to Interventions:  Avoidant and Disengaged   Plan: Continue to engage patient in PT/OT groups 1 - 2x/week.  Lyndell Allaire R.,  MPH, MS, OTR/L ascom [phone] 01/03/25, 4:24 PM

## 2025-01-03 NOTE — Progress Notes (Signed)
 [facility] MD Progress Note  Patient is a 69 year old female with a history of Schizoaffective Disorder, Bipolar Type who presents via [facility] under IVC, initiated by CM, Falencio with re-entry program, to [facility] for assessment. Per IVC, [name] has been diagnosed with Schizophrenia and is refusing to take medication. [name] isn't sleeping. Residents report her screaming and yelling all night long.  [name] responding to internal stimuli.  [name] verbally aggressive.  The respondent has locked all of the residents inside of the transitional housing home.  They could not leave the home which prompted the IVC.  Residents did not have access to food, the restroom or anything as a result of being locked in.  [name] is currently under Adult protective services care. Patient is admitted to [facility] unit with Q15 min safety monitoring. Multidisciplinary team approach is offered. Medication management; group/milieu therapy is offered.   The patient remains psychiatrically stable at this time. [name] awaiting placement, and continued inpatient care is medically necessary primarily due to disposition considerations rather than acute psychiatric decompensation.  Subjective:  Chart reviewed, case discussed in multidisciplinary meeting, patient seen during rounds.   Patient is noted to be resting in bed.  She offers no complaints.  Patient reports ongoing discomfort after eating food.  [name] recommended to continue to take antacids.  Patient denies any other physical problems.  Patient denies SI/HI/plan and denies hallucinations.   Psychiatric History: see h&P Family History:  Family History  Problem Relation Age of Onset   Diabetes Mother    CAD Mother    Lung cancer Father    Social History:  Social History   Substance and Sexual Activity  Alcohol Use Yes   Comment: beer occasionally     Social History   Substance and Sexual Activity  Drug Use No    Social History    Socioeconomic History   Marital status: Widowed    Spouse name: Not on file   Number of children: Not on file   Years of education: Not on file   Highest education level: Not on file  Occupational History   Not on file  Tobacco Use   Smoking status: Every Day    Current packs/day: 0.50    Average packs/day: 0.5 packs/day for 40.0 years (20.0 ttl pk-yrs)    Types: Cigarettes   Smokeless tobacco: Never  Vaping Use   Vaping status: Never Used  Substance and Sexual Activity   Alcohol use: Yes    Comment: beer occasionally   Drug use: No   Sexual activity: Not Currently    Birth control/protection: Post-menopausal  Other Topics Concern   Not on file  Social History Narrative   Not on file   Social Drivers of Health   Tobacco Use: High Risk (11/02/2024)   Patient History    Smoking Tobacco Use: Every Day    Smokeless Tobacco Use: Never    Passive Exposure: Not on file  Financial Resource Strain: Not on file  Food Insecurity: No Food Insecurity (10/17/2024)   Epic    Worried About Programme Researcher, Broadcasting/film/video in the Last Year: Never true    Ran Out of Food in the Last Year: Never true  Transportation Needs: No Transportation Needs (10/17/2024)   Epic    Lack of Transportation (Medical): No    Lack of Transportation (Non-Medical): No  Recent Concern: Transportation Needs - Unmet Transportation Needs (10/14/2024)   Epic    Lack of Transportation (  Medical): Yes    Lack of Transportation (Non-Medical): Yes  Physical Activity: Not on file  Stress: Not on file  Social Connections: Unknown (10/17/2024)   Social Connection and Isolation Panel    Frequency of Communication with Friends and Family: Patient unable to answer    Frequency of Social Gatherings with Friends and Family: Patient unable to answer    Attends Religious Services: Patient unable to answer    Active Member of Clubs or Organizations: Patient declined    Attends Banker Meetings: Patient unable to  answer    Marital Status: Widowed  Depression (PHQ2-9): Not on file  Alcohol Screen: Low Risk (10/17/2024)   Alcohol Screen    Last Alcohol Screening Score (AUDIT): 0  Housing: Low Risk (10/17/2024)   Epic    Unable to Pay for Housing in the Last Year: No    Number of Times Moved in the Last Year: 0    Homeless in the Last Year: No  Utilities: Not At Risk (10/17/2024)   Epic    Threatened with loss of utilities: No  Health Literacy: Not on file   Past Medical History:  Past Medical History:  Diagnosis Date   Eye globe prosthesis    GERD (gastroesophageal reflux disease)    History of blood transfusion 1973   related to abscess burst in my stomach   Hyperlipemia    Hyperlipidemia    Hypertension    Osteoarthritis of back    Lowerback    SVD (spontaneous vaginal delivery)    x 1, baby died at 2 wks of age   Type II diabetes mellitus (HCC)     Past Surgical History:  Procedure Laterality Date   APPENDECTOMY     COLONOSCOPY     DILATATION & CURETTAGE/HYSTEROSCOPY WITH MYOSURE N/A 02/25/2015   Procedure: DILATATION & CURETTAGE/HYSTEROSCOPY WITH MYOSURE;  Surgeon: Truman Corona, MD;  Location: [facility];  Service: Gynecology;  Laterality: N/A;   DILATION AND CURETTAGE OF UTERUS     ENUCLEATION Right 03/16/2017   ENUCLEATION Right 03/16/2017   Procedure: ENUCLEATION RIGHT EYE;  Surgeon: Loyd Kathryne Palm, MD;  Location: [facility] OR;  Service: Ophthalmology;  Laterality: Right;   EYE SURGERY     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6, 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   EYE SURGERY Right ~ 1974   S/P initial eye injury; scissors stuck in my eye   LAPAROSCOPIC CHOLECYSTECTOMY     SHOULDER ARTHROSCOPY WITH ROTATOR CUFF REPAIR Left    wire stitches per patient   SHOULDER ARTHROSCOPY WITH ROTATOR CUFF REPAIR Right     Current Medications: Current Facility-Administered Medications  Medication Dose Route Frequency Provider Last Rate Last Admin   acetaminophen  (TYLENOL ) tablet 650 mg  650 mg Oral Q6H PRN Tex Drilling, NP   650 mg at 12/07/24 2126   alum & mag hydroxide-simeth (MAALOX/MYLANTA) 200-200-20 MG/5ML suspension 30 mL  30 mL Oral Q4H PRN Tex Drilling, NP       cloNIDine  (CATAPRES ) tablet 0.1 mg  0.1 mg Oral BID PRN Tex Drilling, NP       divalproex  (DEPAKOTE ) DR tablet 1,500 mg  1,500 mg Oral QHS Miral Hoopes, MD   1,500 mg at 12/07/24 2126   divalproex  (DEPAKOTE ) DR tablet 500 mg  500 mg Oral q AM Alezandra Egli, MD   500 mg at 12/08/24 9192   losartan  (COZAAR ) tablet 25 mg  25 mg Oral Daily Nkwenti, Doris, NP   25 mg at 12/08/24 [phone]  magnesium  hydroxide (MILK OF MAGNESIA) suspension 30 mL  30 mL Oral Daily PRN Tex Drilling, NP       menthol  (CEPACOL) lozenge 3 mg  1 lozenge Oral PRN Bobbitt, Shalon E, NP   3 mg at 12/08/24 0934   methimazole  (TAPAZOLE ) tablet 10 mg  10 mg Oral Daily Nkwenti, Doris, NP   10 mg at 12/08/24 9071   OLANZapine  (ZYPREXA ) injection 5 mg  5 mg Intramuscular TID PRN Tex Drilling, NP       OLANZapine  (ZYPREXA ) injection 5 mg  5 mg Intramuscular TID PRN Tex Drilling, NP       OLANZapine  (ZYPREXA ) tablet 20 mg  20 mg Oral QHS Rozell Theiler, MD   20 mg at 12/07/24 2127   OLANZapine  zydis (ZYPREXA ) disintegrating tablet 5 mg  5 mg Oral TID PRN Tex Drilling, NP       OLANZapine  zydis (ZYPREXA ) disintegrating tablet 5 mg  5 mg Oral TID PRN Tex Drilling, NP       paliperidone  (INVEGA ) 24 hr tablet 3 mg  3 mg Oral Daily Shrivastava, Aryendra, MD   3 mg at 12/08/24 9071   pantoprazole  (PROTONIX ) EC tablet 40 mg  40 mg Oral Daily Nkwenti, Doris, NP   40 mg at 12/08/24 9071   traZODone  (DESYREL ) tablet 50 mg  50 mg Oral QHS PRN Tex Drilling, NP   50 mg at 12/06/24 2109    Lab Results:  No results found for this or any previous visit (from the past 48 hours).       Blood Alcohol level:  Lab Results  Component Value Date   [facility] <15 10/14/2024   ETH <10 07/02/2022    Metabolic Disorder Labs: Lab Results  Component Value Date   HGBA1C 5.5  10/14/2024   MPG 111.15 10/14/2024   MPG 137 01/06/2022   Lab Results  Component Value Date   PROLACTIN 5.8 10/14/2024   Lab Results  Component Value Date   CHOL 167 10/14/2024   TRIG 68 10/14/2024   HDL 60 10/14/2024   CHOLHDL 2.8 10/14/2024   VLDL 14 10/14/2024   LDLCALC 93 10/14/2024   LDLCALC 73 03/13/2024    Physical Findings: AIMS:  , ,  ,  ,    CIWA:    COWS:      Psychiatric Specialty Exam:  Presentation  General Appearance:  Appropriate for Environment  Eye Contact: Fleeting  Speech: Normal Rate  Speech Volume: Decreased    Mood and Affect  Mood:fine  Affect: Flat   Thought Process  Thought Processes: impoverished  Orientation:Partial  Thought Content: Chronic delusions Hallucinations: Denies  Ideas of Reference:Delusions; Paranoia  Suicidal Thoughts: Denies  Homicidal Thoughts: Denies   Sensorium  Memory: Immediate Fair; Recent Fair  Judgment: Impaired  Insight: Shallow   Executive Functions  Concentration: Fair  Attention Span: Fair  Recall: Poor  Fund of Knowledge: Fair  Language: Fair   Psychomotor Activity  Psychomotor Activity: No data recorded  Musculoskeletal: Strength & Muscle Tone: within normal limits Gait & Station: normal Assets  Assets: Manufacturing Systems Engineer; Desire for Improvement; Social Support    Physical Exam: Physical Exam Vitals and nursing note reviewed.    ROS Blood pressure (!) 152/69, pulse 98, temperature 98.3 F (36.8 C), resp. rate 18, height 5' 8 (1.727 m), weight 48.3 kg, SpO2 100%. Body mass index is 16.19 kg/m.  Diagnosis: Principal Problem:   Schizophrenia, paranoid (HCC) Major neurocognitive disorder-slums total score of 13/30   Treatment Plan Summary:  APS took legal guardianship and  working further placement at appropriate level of care  Safety and Monitoring:             -- Involuntary admission to inpatient psychiatric unit for safety,  stabilization and treatment             -- Daily contact with patient to assess and evaluate symptoms and progress in treatment             -- Patient's case to be discussed in multi-disciplinary team meeting             -- Observation Level: q15 minute checks             -- Vital signs:  q12 hours             -- Precautions: suicide, elopement, and assault   2. Psychiatric Diagnoses and Treatment:  Depakote  levels 11/20/2024-60 Invega  6 mg Depakote  500 mg every morning and 1500 mg  Zyprexa  20 mg nightly  -- The risks/benefits/side-effects/alternatives to this medication were discussed in detail with the patient and time was given for questions. The patient consents to medication trial.                -- Metabolic profile and EKG monitoring obtained while on an atypical antipsychotic (BMI: Lipid Panel: HbgA1c: QTc:)              -- Encouraged patient to participate in unit milieu and in scheduled group therapies   Occupational Therapy recommendations If plan is discharge home, recommend the following:   Direct supervision/assist for medications management;Direct supervision/assist for financial management;Assist for transportation;Assistance with cooking/housework    Ms Enzor was seen for OT treatment on this date. Upon arrival to room pt in dayroom, agreeable to tx. The SLUMS is a 30-point screening questionnaire that tests orientation, memory, attention, problem solving, and executive function. Pt scored a 13/30 indicating Dementia. Of note, it is not within occupational therapy scope of practice to diagnose cognitive impairments, this screen indicates need for further testing. Pt with noted impairments in memory and problem solving limiting ability to participate functionally in medication management, bill management, and safety cooking. Will continue to follow POC. Continue to recommend SUPERVISION for safety with iADLs.    [facility] Mental Status Examination Orientation:  2/3  Calculations: 0/3 Naming animals: 2/3 Patient named 14 animals (0 points is 0-4 animals; 1 is 5-9 animals; 2 is 10-14 animals; 3 is 15+ animals) Recall: 3/5  Attention: 1/2 Clock drawing: 1/4 Visual Processing: 2/2 Paragraph Memory: 2/8  Total: 13/30;  Given that patient has completed high school, this score falls in the dementia range.   4. Discharge Planning:   -- Social work and case management to assist with discharge planning and identification of hospital follow-up needs prior to discharge  -- Estimated LOS: 3-4 days  Millie JONELLE Manners, MD 12/08/2024, 5:46 PM

## 2025-01-03 NOTE — Group Note (Signed)
 Date:  01/03/2025 Time:  10:02 AM  Group Topic/Focus:  Recovery Goals:   The focus of this group is to identify appropriate goals for recovery and establish a plan to achieve them.    Participation Level:  Did Not Attend  Maglione,Reyn Faivre E 01/03/2025, 10:02 AM

## 2025-01-03 NOTE — Progress Notes (Signed)
(  Sleep Hours) - 7.50 (Any PRNs that were needed, meds refused, or side effects to meds)- Trazodone : effective (Any disturbances and when (visitation, over night)-None reported or observed (Concerns raised by the patient)- None reported or observed (SI/HI/AVH)- Denies   01/02/25 2110  Psych Admission Type (Psych Patients Only)  Admission Status Voluntary  Psychosocial Assessment  Patient Complaints None  Eye Contact Fair  Facial Expression Animated  Affect Appropriate to circumstance  Speech Logical/coherent  Interaction Minimal  Motor Activity Slow  Appearance/Hygiene Unremarkable  Behavior Characteristics Cooperative  Mood Pleasant  Aggressive Behavior  Effect No apparent injury  Thought Process  Coherency WDL  Content WDL  Delusions None reported or observed  Perception WDL  Hallucination None reported or observed  Judgment Impaired  Confusion None  Danger to Self  Current suicidal ideation? Denies  Danger to Others  Danger to Others None reported or observed

## 2025-01-03 NOTE — Progress Notes (Signed)
 Patient pleasant during interaction. Accepted medications without issues. Denies SI/HI/AVH. Present in dayroom for meals and group. No additional concerns voiced. Ongoing monitoring continues.   01/03/25 1100  Psych Admission Type (Psych Patients Only)  Admission Status Voluntary  Psychosocial Assessment  Patient Complaints None  Eye Contact Fair  Facial Expression Animated  Affect Appropriate to circumstance  Speech Logical/coherent  Interaction Minimal  Motor Activity Slow  Appearance/Hygiene Unremarkable  Behavior Characteristics Cooperative  Mood Pleasant  Thought Process  Coherency WDL  Content WDL  Delusions None reported or observed  Perception WDL  Hallucination None reported or observed  Judgment Impaired  Confusion None  Danger to Self  Current suicidal ideation? Denies  Danger to Others  Danger to Others None reported or observed

## 2025-01-03 NOTE — NC FL2 (Signed)
 " [place]  MEDICAID FL2 LEVEL OF CARE FORM     IDENTIFICATION  Patient Name: Shari [name] Birthdate: 10-09-1956 Sex: female Admission Date (Current Location): 10/17/2024  [place] and [place] Number:  [name] [id number] Facility and Address:  [facility], [address], [place], [place] [address]      Provider Number: [id number]  Attending Physician Name and Address:  Shari Mellow, MD  Relative Name and Phone Number:  Shari [name], DSS caseworker, [phone]    Current Level of Care: Hospital Recommended Level of Care: Assisted Living Facility Prior Approval Number:    Date Approved/Denied:   PASRR Number:    Discharge Plan: Other (Comment) (Assisted Living)    Current Diagnoses: Patient Active Problem List   Diagnosis Date Noted   Schizophrenia, paranoid (HCC) 10/17/2024   Sinus tachycardia 03/15/2024   PAC (premature atrial contraction) 03/15/2024   Abnormal echocardiogram 03/15/2024   Hypokalemia 03/13/2024   Hypomagnesemia 03/13/2024   Chest pain 03/13/2024   Shortness of breath 03/13/2024   Fever 03/13/2024   Hyperlipidemia 03/13/2024   Schizoaffective disorder, bipolar type (HCC) 01/05/2022   Schizophrenia (HCC) 01/05/2022   Encounter for psychiatric assessment 11/21/2021   Paranoid schizophrenia (HCC) 10/20/2021   Mild cognitive impairment 10/18/2021   Gastroesophageal reflux disease 02/26/2021   Hypercholesterolemia 02/26/2021   Hypertension 02/26/2021   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   Graves disease 09/20/2019   Palpitations 05/29/2019   Hyperthyroidism 05/29/2019   Mixed dyslipidemia 05/29/2019   Paroxysmal atrial fibrillation (HCC) 05/29/2019   PMB (postmenopausal bleeding) 03/21/2018   Urinary retention 03/21/2018   Uterine enlargement 03/21/2018   Surgery, elective 03/16/2017    Orientation RESPIRATION BLADDER Height & Weight     Self, Place, Time  Normal  Continent Weight: 106 lb 8 oz (48.3 kg) Height:  5' 8 (172.7 cm)  BEHAVIORAL SYMPTOMS/MOOD NEUROLOGICAL BOWEL NUTRITION STATUS   (N/A)  (N/A) Continent Diet (Regular)  AMBULATORY STATUS COMMUNICATION OF NEEDS Skin   Independent Verbally Normal                       Personal Care Assistance Level of Assistance   (N/A)           Functional Limitations Info  Sight Sight Info: Impaired        SPECIAL CARE FACTORS FREQUENCY  Blood pressure Blood Pressure Frequency: Twice daily in the hospital                  Contractures Contractures Info: Not present    Additional Factors Info  Code Status, Allergies Code Status Info: FULL Allergies Info: Aspirin, Chocolate, Cocoa, Penicillins, Sulfa Antibiotics Psychotropic Info: OLANZapine  (ZYPREXA ) tablet 20 mg, paliperidone  (INVEGA ) 24 hr tablet 6 mg         Current Medications (01/03/2025):  This is the current hospital active medication list Current Facility-Administered Medications  Medication Dose Route Frequency Provider Last Rate Last Admin   acetaminophen  (TYLENOL ) tablet 650 mg  650 mg Oral Q6H PRN Shari Drilling, NP   650 mg at 01/01/25 2122   alum & mag hydroxide-simeth (MAALOX/MYLANTA) 200-200-20 MG/5ML suspension 30 mL  30 mL Oral Q4H PRN Shari [name], Drilling, NP       cloNIDine  (CATAPRES ) tablet 0.1 mg  0.1 mg Oral BID PRN Shari Drilling, NP       diclofenac  Sodium (VOLTAREN ) 1 % topical gel 4 g  4 g Topical QID PRN Shari Mellow, MD  divalproex  (DEPAKOTE ) DR tablet 1,500 mg  1,500 mg Oral QHS Shari [name], Sree, MD   1,500 mg at 01/02/25 2110   divalproex  (DEPAKOTE ) DR tablet 500 mg  500 mg Oral q AM Shari [name], Sree, MD   500 mg at 01/03/25 0744   famotidine  (PEPCID ) 40 MG/5ML suspension 40 mg  40 mg Oral QHS Shari [name], Sree, MD   40 mg at 01/02/25 2112   feeding supplement (ENSURE PLUS HIGH PROTEIN) liquid 237 mL  237 mL Oral BID BM Shari [name], Sree, MD   237 mL at 01/03/25 1338   lidocaine  (LIDODERM ) 5 % 1  patch  1 patch Transdermal Daily PRN Shari [name], Sree, MD       losartan  (COZAAR ) tablet 25 mg  25 mg Oral Daily Shari [name], Doris, NP   25 mg at 01/03/25 9060   magnesium  hydroxide (MILK OF MAGNESIA) suspension 30 mL  30 mL Oral Daily PRN Shari Drilling, NP       menthol  (CEPACOL) lozenge 3 mg  1 lozenge Oral PRN Bobbitt, Shari E, NP   3 mg at 12/30/24 2127   methimazole  (TAPAZOLE ) tablet 10 mg  10 mg Oral Daily Shari [name], Doris, NP   10 mg at 01/03/25 0940   multivitamin with minerals tablet 1 tablet  1 tablet Oral Daily Shari [name], Sree, MD   1 tablet at 01/03/25 9060   OLANZapine  (ZYPREXA ) injection 5 mg  5 mg Intramuscular TID PRN Shari Drilling, NP       OLANZapine  (ZYPREXA ) injection 5 mg  5 mg Intramuscular TID PRN Shari Drilling, NP       OLANZapine  (ZYPREXA ) tablet 20 mg  20 mg Oral QHS Shari [name], Sree, MD   20 mg at 01/02/25 2112   OLANZapine  zydis (ZYPREXA ) disintegrating tablet 5 mg  5 mg Oral TID PRN Shari Drilling, NP       OLANZapine  zydis (ZYPREXA ) disintegrating tablet 5 mg  5 mg Oral TID PRN Shari Drilling, NP       paliperidone  (INVEGA ) 24 hr tablet 6 mg  6 mg Oral Daily Shari [name], Sree, MD   6 mg at 01/03/25 9060   pantoprazole  (PROTONIX ) EC tablet 40 mg  40 mg Oral BID AC Zhang, Shari T, MD   40 mg at 01/03/25 0744   sucralfate  (CARAFATE ) 1 GM/10ML suspension 1 g  1 g Oral TID WC & HS Shari [name] T, MD   1 g at 01/03/25 1138   traZODone  (DESYREL ) tablet 50 mg  50 mg Oral QHS PRN Shari Drilling, NP   50 mg at 01/02/25 2111     Discharge Medications: Please see discharge summary for a list of discharge medications.  Relevant Imaging Results:  Relevant Lab Results:   Additional Information SSN# [id number]  Shari [name], [place]     "

## 2025-01-03 NOTE — Plan of Care (Signed)
" °  Problem: Safety: Goal: Periods of time without injury will increase Outcome: Progressing   Problem: Health Behavior/Discharge Planning: Goal: Identification of resources available to assist in meeting health care needs will improve Outcome: Progressing Goal: Compliance with treatment plan for underlying cause of condition will improve Outcome: Progressing   Problem: Education: Goal: Mental status will improve Outcome: Progressing   "

## 2025-01-04 NOTE — Progress Notes (Signed)
" °   01/04/25 0900  Psych Admission Type (Psych Patients Only)  Admission Status Involuntary  Psychosocial Assessment  Patient Complaints None  Eye Contact Fair  Facial Expression Flat  Affect Appropriate to circumstance  Speech Logical/coherent  Interaction Minimal  Motor Activity Slow  Appearance/Hygiene Unremarkable  Behavior Characteristics Cooperative;Appropriate to situation  Mood Pleasant  Thought Process  Coherency WDL  Content WDL  Delusions None reported or observed  Perception WDL  Hallucination None reported or observed  Judgment Impaired  Confusion WDL  Danger to Self  Current suicidal ideation? Denies  Danger to Others  Danger to Others None reported or observed    "

## 2025-01-04 NOTE — Plan of Care (Signed)
  Problem: Education: Goal: Emotional status will improve Outcome: Progressing   Problem: Activity: Goal: Interest or engagement in activities will improve Outcome: Progressing   Problem: Health Behavior/Discharge Planning: Goal: Compliance with treatment plan for underlying cause of condition will improve Outcome: Progressing

## 2025-01-04 NOTE — Plan of Care (Signed)
   Problem: Education: Goal: Emotional status will improve Outcome: Progressing Goal: Mental status will improve Outcome: Progressing Goal: Verbalization of understanding the information provided will improve Outcome: Progressing   Problem: Activity: Goal: Interest or engagement in activities will improve Outcome: Progressing Goal: Sleeping patterns will improve Outcome: Progressing

## 2025-01-04 NOTE — Progress Notes (Signed)
 [facility] MD Progress Note  Clinical Summary & Disposition Patient: 69 year old female Diagnosis: Schizoaffective Disorder, Bipolar Type Status: Inpatient (Geropsychiatric Unit), Q15 Minute Safety Monitoring  HPI/Reason for Admission: The patient was brought to Behavioral Health Urgent Care by the [facility] Department ([facility]) under an Involuntary Commitment (IVC) initiated by Case Manager Falencio. Per the IVC petition, the patient has been non-compliant with her medication regimen, resulting in significant behavioral decompensation. Reports from her transitional housing indicate the patient remained awake overnight, screaming and responding to internal stimuli. The situation escalated when the patient locked all residents inside the home, preventing access to food and restrooms. Due to these safety concerns and her current status under Adult Protective Services (APS), she was admitted for stabilization.  Current Status: The patient is currently offered a multidisciplinary treatment plan, including medication management and milieu therapy. While she remains psychiatrically stable and is not currently showing signs of acute decompensation, she remains on the unit awaiting safe placement. Continued inpatient care is medically necessary at this time due to disposition considerations and the lack of a safe discharge environment.     Psychiatric History: see h&P Family History:  Family History  Problem Relation Age of Onset   Diabetes Mother    CAD Mother    Lung cancer Father    Social History:  Social History   Substance and Sexual Activity  Alcohol Use Yes   Comment: beer occasionally     Social History   Substance and Sexual Activity  Drug Use No    Social History   Socioeconomic History   Marital status: Widowed    Spouse name: Not on file   Number of children: Not on file   Years of education: Not on file   Highest education level: Not on file  Occupational History   Not on  file  Tobacco Use   Smoking status: Every Day    Current packs/day: 0.50    Average packs/day: 0.5 packs/day for 40.0 years (20.0 ttl pk-yrs)    Types: Cigarettes   Smokeless tobacco: Never  Vaping Use   Vaping status: Never Used  Substance and Sexual Activity   Alcohol use: Yes    Comment: beer occasionally   Drug use: No   Sexual activity: Not Currently    Birth control/protection: Post-menopausal  Other Topics Concern   Not on file  Social History Narrative   Not on file   Social Drivers of Health   Tobacco Use: High Risk (11/02/2024)   Patient History    Smoking Tobacco Use: Every Day    Smokeless Tobacco Use: Never    Passive Exposure: Not on file  Financial Resource Strain: Not on file  Food Insecurity: No Food Insecurity (10/17/2024)   Epic    Worried About Programme Researcher, Broadcasting/film/video in the Last Year: Never true    Ran Out of Food in the Last Year: Never true  Transportation Needs: No Transportation Needs (10/17/2024)   Epic    Lack of Transportation (Medical): No    Lack of Transportation (Non-Medical): No  Recent Concern: Transportation Needs - Unmet Transportation Needs (10/14/2024)   Epic    Lack of Transportation (Medical): Yes    Lack of Transportation (Non-Medical): Yes  Physical Activity: Not on file  Stress: Not on file  Social Connections: Unknown (10/17/2024)   Social Connection and Isolation Panel    Frequency of Communication with Friends and Family: Patient unable to answer    Frequency of Social Gatherings with Friends and  Family: Patient unable to answer    Attends Religious Services: Patient unable to answer    Active Member of Clubs or Organizations: Patient declined    Attends Banker Meetings: Patient unable to answer    Marital Status: Widowed  Depression (PHQ2-9): Not on file  Alcohol Screen: Low Risk (10/17/2024)   Alcohol Screen    Last Alcohol Screening Score (AUDIT): 0  Housing: Low Risk (10/17/2024)   Epic    Unable to  Pay for Housing in the Last Year: No    Number of Times Moved in the Last Year: 0    Homeless in the Last Year: No  Utilities: Not At Risk (10/17/2024)   Epic    Threatened with loss of utilities: No  Health Literacy: Not on file   Past Medical History:  Past Medical History:  Diagnosis Date   Eye globe prosthesis    GERD (gastroesophageal reflux disease)    History of blood transfusion 1973   related to abscess burst in my stomach   Hyperlipemia    Hyperlipidemia    Hypertension    Osteoarthritis of back    Lowerback    SVD (spontaneous vaginal delivery)    x 1, baby died at 2 wks of age   Type II diabetes mellitus (HCC)     Past Surgical History:  Procedure Laterality Date   APPENDECTOMY     COLONOSCOPY     DILATATION & CURETTAGE/HYSTEROSCOPY WITH MYOSURE N/A 02/25/2015   Procedure: DILATATION & CURETTAGE/HYSTEROSCOPY WITH MYOSURE;  Surgeon: Truman Corona, MD;  Location: [facility];  Service: Gynecology;  Laterality: N/A;   DILATION AND CURETTAGE OF UTERUS     ENUCLEATION Right 03/16/2017   ENUCLEATION Right 03/16/2017   Procedure: ENUCLEATION RIGHT EYE;  Surgeon: Loyd Kathryne Palm, MD;  Location: [facility] OR;  Service: Ophthalmology;  Laterality: Right;   EYE SURGERY     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6, 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   EYE SURGERY Right ~ 1974   S/P initial eye injury; scissors stuck in my eye   LAPAROSCOPIC CHOLECYSTECTOMY     SHOULDER ARTHROSCOPY WITH ROTATOR CUFF REPAIR Left    wire stitches per patient   SHOULDER ARTHROSCOPY WITH ROTATOR CUFF REPAIR Right     Current Medications: Current Facility-Administered Medications  Medication Dose Route Frequency Provider Last Rate Last Admin   acetaminophen  (TYLENOL ) tablet 650 mg  650 mg Oral Q6H PRN Tex Drilling, NP   650 mg at 12/07/24 2126   alum & mag hydroxide-simeth (MAALOX/MYLANTA) 200-200-20 MG/5ML suspension 30 mL  30 mL Oral Q4H PRN Tex Drilling, NP       cloNIDine  (CATAPRES ) tablet 0.1 mg  0.1 mg Oral BID PRN  Tex Drilling, NP       divalproex  (DEPAKOTE ) DR tablet 1,500 mg  1,500 mg Oral QHS Jadapalle, Sree, MD   1,500 mg at 12/07/24 2126   divalproex  (DEPAKOTE ) DR tablet 500 mg  500 mg Oral q AM Jadapalle, Sree, MD   500 mg at 12/08/24 9192   losartan  (COZAAR ) tablet 25 mg  25 mg Oral Daily Nkwenti, Doris, NP   25 mg at 12/08/24 9071   magnesium  hydroxide (MILK OF MAGNESIA) suspension 30 mL  30 mL Oral Daily PRN Tex Drilling, NP       menthol  (CEPACOL) lozenge 3 mg  1 lozenge Oral PRN Bobbitt, Shalon E, NP   3 mg at 12/08/24 0934   methimazole  (TAPAZOLE ) tablet 10 mg  10 mg Oral Daily  Tex Drilling, NP   10 mg at 12/08/24 9071   OLANZapine  (ZYPREXA ) injection 5 mg  5 mg Intramuscular TID PRN Tex Drilling, NP       OLANZapine  (ZYPREXA ) injection 5 mg  5 mg Intramuscular TID PRN Tex Drilling, NP       OLANZapine  (ZYPREXA ) tablet 20 mg  20 mg Oral QHS Jadapalle, Sree, MD   20 mg at 12/07/24 2127   OLANZapine  zydis (ZYPREXA ) disintegrating tablet 5 mg  5 mg Oral TID PRN Tex Drilling, NP       OLANZapine  zydis (ZYPREXA ) disintegrating tablet 5 mg  5 mg Oral TID PRN Tex Drilling, NP       paliperidone  (INVEGA ) 24 hr tablet 3 mg  3 mg Oral Daily Shrivastava, Aryendra, MD   3 mg at 12/08/24 9071   pantoprazole  (PROTONIX ) EC tablet 40 mg  40 mg Oral Daily Nkwenti, Doris, NP   40 mg at 12/08/24 9071   traZODone  (DESYREL ) tablet 50 mg  50 mg Oral QHS PRN Tex Drilling, NP   50 mg at 12/06/24 2109    Lab Results:  No results found for this or any previous visit (from the past 48 hours).       Blood Alcohol level:  Lab Results  Component Value Date   [facility] <15 10/14/2024   ETH <10 07/02/2022    Metabolic Disorder Labs: Lab Results  Component Value Date   HGBA1C 5.5 10/14/2024   MPG 111.15 10/14/2024   MPG 137 01/06/2022   Lab Results  Component Value Date   PROLACTIN 5.8 10/14/2024   Lab Results  Component Value Date   CHOL 167 10/14/2024   TRIG 68 10/14/2024   HDL 60  10/14/2024   CHOLHDL 2.8 10/14/2024   VLDL 14 10/14/2024   LDLCALC 93 10/14/2024   LDLCALC 73 03/13/2024    Physical Findings: AIMS:  , ,  ,  ,    CIWA:    COWS:      Psychiatric Specialty Exam:  Presentation  General Appearance:  Appropriate for Environment  Eye Contact: Fleeting  Speech: Normal Rate  Speech Volume: Decreased    Mood and Affect  Mood:fine  Affect: Flat   Thought Process  Thought Processes: impoverished  Orientation:Partial  Thought Content: Chronic delusions Hallucinations: Denies  Ideas of Reference:Delusions; Paranoia  Suicidal Thoughts: Denies  Homicidal Thoughts: Denies   Sensorium  Memory: Immediate Fair; Recent Fair  Judgment: Impaired  Insight: Shallow   Executive Functions  Concentration: Fair  Attention Span: Fair  Recall: Poor  Fund of Knowledge: Fair  Language: Fair   Psychomotor Activity  Psychomotor Activity: No data recorded  Musculoskeletal: Strength & Muscle Tone: within normal limits Gait & Station: normal Assets  Assets: Manufacturing Systems Engineer; Desire for Improvement; Social Support    Physical Exam: Physical Exam Vitals and nursing note reviewed.    ROS Blood pressure (!) 152/69, pulse 98, temperature 98.3 F (36.8 C), resp. rate 18, height 5' 8 (1.727 m), weight 48.3 kg, SpO2 100%. Body mass index is 16.19 kg/m.  Diagnosis: Principal Problem:   Schizophrenia, paranoid (HCC) Major neurocognitive disorder-slums total score of 13/30   Treatment Plan Summary: APS took legal guardianship and  working further placement at appropriate level of care  Safety and Monitoring:             -- Involuntary admission to inpatient psychiatric unit for safety, stabilization and treatment             -- Daily  contact with patient to assess and evaluate symptoms and progress in treatment             -- Patient's case to be discussed in multi-disciplinary team meeting             --  Observation Level: q15 minute checks             -- Vital signs:  q12 hours             -- Precautions: suicide, elopement, and assault   2. Psychiatric Diagnoses and Treatment:  Depakote  levels 11/20/2024-60 Invega  6 mg Depakote  500 mg every morning and 1500 mg  Zyprexa  20 mg nightly  -- The risks/benefits/side-effects/alternatives to this medication were discussed in detail with the patient and time was given for questions. The patient consents to medication trial.                -- Metabolic profile and EKG monitoring obtained while on an atypical antipsychotic (BMI: Lipid Panel: HbgA1c: QTc:)              -- Encouraged patient to participate in unit milieu and in scheduled group therapies   Occupational Therapy recommendations If plan is discharge home, recommend the following:   Direct supervision/assist for medications management;Direct supervision/assist for financial management;Assist for transportation;Assistance with cooking/housework    Ms Kasparek was seen for OT treatment on this date. Upon arrival to room pt in dayroom, agreeable to tx. The SLUMS is a 30-point screening questionnaire that tests orientation, memory, attention, problem solving, and executive function. Pt scored a 13/30 indicating Dementia. Of note, it is not within occupational therapy scope of practice to diagnose cognitive impairments, this screen indicates need for further testing. Pt with noted impairments in memory and problem solving limiting ability to participate functionally in medication management, bill management, and safety cooking. Will continue to follow POC. Continue to recommend SUPERVISION for safety with iADLs.    [facility] Mental Status Examination Orientation: 2/3  Calculations: 0/3 Naming animals: 2/3 Patient named 14 animals (0 points is 0-4 animals; 1 is 5-9 animals; 2 is 10-14 animals; 3 is 15+ animals) Recall: 3/5  Attention: 1/2 Clock drawing: 1/4 Visual Processing:  2/2 Paragraph Memory: 2/8  Total: 13/30;  Given that patient has completed high school, this score falls in the dementia range.   4. Discharge Planning:   -- Social work and case management to assist with discharge planning and identification of hospital follow-up needs prior to discharge  -- Estimated LOS: 3-4 days  Millie JONELLE Manners, MD 12/08/2024, 5:46 PM

## 2025-01-04 NOTE — Group Note (Signed)
 Date:  01/04/2025 Time:  5:43 AM  Group Topic/Focus:  Wrap-Up Group:   The focus of this group is to help patients review their daily goal of treatment and discuss progress on daily workbooks.    Participation Level:  Active  Participation Quality:  Appropriate  Affect:  Appropriate  Cognitive:  Alert  Insight: Appropriate  Engagement in Group:  Engaged  Modes of Intervention:  Discussion  Additional Comments:    Shari [name] Kashton Mcartor 01/04/2025, 5:43 AM

## 2025-01-04 NOTE — Progress Notes (Signed)
 Pt denies SI/HI/AVH. She voiced no complaints this shift.   01/03/25 2112  Psych Admission Type (Psych Patients Only)  Admission Status Involuntary  Psychosocial Assessment  Patient Complaints None  Eye Contact Fair  Facial Expression Other (Comment) (wnl)  Affect Appropriate to circumstance  Speech Logical/coherent  Interaction Minimal  Motor Activity Slow  Appearance/Hygiene Unremarkable  Behavior Characteristics Cooperative  Mood Pleasant  Thought Process  Coherency WDL  Content WDL  Delusions None reported or observed  Perception WDL  Hallucination None reported or observed  Judgment Impaired  Confusion None  Danger to Self  Current suicidal ideation? Denies  Danger to Others  Danger to Others None reported or observed

## 2025-01-04 NOTE — Group Note (Signed)
 Date:  01/04/2025 Time:  11:56 AM  Group Topic/Focus:  Goals Group:   The focus of this group is to help patients establish daily goals to achieve during treatment and discuss how the patient can incorporate goal setting into their daily lives to aide in recovery.    Participation Level:  Did Not Attend  Larrie Leita BRAVO 01/04/2025, 11:56 AM

## 2025-01-04 NOTE — Group Note (Signed)
 Date:  01/04/2025 Time:  2:07 PM  Group Topic/Focus:  Movement Therapy    Participation Level:  Did Not Attend    [name] 01/04/2025, 2:07 PM

## 2025-01-05 DIAGNOSIS — F200 Paranoid schizophrenia: Secondary | ICD-10-CM | POA: Diagnosis not present

## 2025-01-05 DIAGNOSIS — F0390 Unspecified dementia without behavioral disturbance: Secondary | ICD-10-CM | POA: Diagnosis not present

## 2025-01-05 NOTE — Plan of Care (Signed)
  Problem: Activity: Goal: Interest or engagement in activities will improve Outcome: Progressing   Problem: Coping: Goal: Ability to verbalize frustrations and anger appropriately will improve Outcome: Progressing Goal: Ability to demonstrate self-control will improve Outcome: Progressing   Problem: Health Behavior/Discharge Planning: Goal: Compliance with treatment plan for underlying cause of condition will improve Outcome: Progressing

## 2025-01-05 NOTE — Progress Notes (Signed)
 [facility] MD Progress Note  Patient is a 69 year old female with a history of Schizoaffective Disorder, Bipolar Type who presents via [facility] under IVC, initiated by CM, Falencio with re-entry program, to [facility] for assessment. Per IVC, [name] has been diagnosed with Schizophrenia and is refusing to take medication. [name] isn't sleeping. Residents report her screaming and yelling all night long.  [name] responding to internal stimuli.  [name] verbally aggressive.  The respondent has locked all of the residents inside of the transitional housing home.  They could not leave the home which prompted the IVC.  Residents did not have access to food, the restroom or anything as a result of being locked in.  [name] is currently under Adult protective services care. Patient is admitted to [facility] unit with Q15 min safety monitoring. Multidisciplinary team approach is offered. Medication management; group/milieu therapy is offered.   The patient remains psychiatrically stable at this time. [name] awaiting placement, and continued inpatient care is medically necessary primarily due to disposition considerations rather than acute psychiatric decompensation.  Subjective:  Chart reviewed, case discussed in multidisciplinary meeting, patient seen during rounds.   Patient is noted to be resting in bed.  She reports ongoing pain in her ribs.  She has reflux and is taking the antacids.  Patient denies SI/HI/plan and denies hallucinations.  Per nursing patient is taking her medications but is visible on the unit, participating in groups.  Patient is not endorsing any depression or anxiety.  Her delusions remain chronic about her living situation   Psychiatric History: see h&P Family History:  Family History  Problem Relation Age of Onset   Diabetes Mother    CAD Mother    Lung cancer Father    Social History:  Social History   Substance and Sexual Activity  Alcohol Use Yes    Comment: beer occasionally     Social History   Substance and Sexual Activity  Drug Use No    Social History   Socioeconomic History   Marital status: Widowed    Spouse name: Not on file   Number of children: Not on file   Years of education: Not on file   Highest education level: Not on file  Occupational History   Not on file  Tobacco Use   Smoking status: Every Day    Current packs/day: 0.50    Average packs/day: 0.5 packs/day for 40.0 years (20.0 ttl pk-yrs)    Types: Cigarettes   Smokeless tobacco: Never  Vaping Use   Vaping status: Never Used  Substance and Sexual Activity   Alcohol use: Yes    Comment: beer occasionally   Drug use: No   Sexual activity: Not Currently    Birth control/protection: Post-menopausal  Other Topics Concern   Not on file  Social History Narrative   Not on file   Social Drivers of Health   Tobacco Use: High Risk (11/02/2024)   Patient History    Smoking Tobacco Use: Every Day    Smokeless Tobacco Use: Never    Passive Exposure: Not on file  Financial Resource Strain: Not on file  Food Insecurity: No Food Insecurity (10/17/2024)   Epic    Worried About Programme Researcher, Broadcasting/film/video in the Last Year: Never true    Ran Out of Food in the Last Year: Never true  Transportation Needs: No Transportation Needs (10/17/2024)   Epic    Lack of Transportation (Medical): No    Lack  of Transportation (Non-Medical): No  Recent Concern: Transportation Needs - Unmet Transportation Needs (10/14/2024)   Epic    Lack of Transportation (Medical): Yes    Lack of Transportation (Non-Medical): Yes  Physical Activity: Not on file  Stress: Not on file  Social Connections: Unknown (10/17/2024)   Social Connection and Isolation Panel    Frequency of Communication with Friends and Family: Patient unable to answer    Frequency of Social Gatherings with Friends and Family: Patient unable to answer    Attends Religious Services: Patient unable to answer    Active  Member of Clubs or Organizations: Patient declined    Attends Banker Meetings: Patient unable to answer    Marital Status: Widowed  Depression (PHQ2-9): Not on file  Alcohol Screen: Low Risk (10/17/2024)   Alcohol Screen    Last Alcohol Screening Score (AUDIT): 0  Housing: Low Risk (10/17/2024)   Epic    Unable to Pay for Housing in the Last Year: No    Number of Times Moved in the Last Year: 0    Homeless in the Last Year: No  Utilities: Not At Risk (10/17/2024)   Epic    Threatened with loss of utilities: No  Health Literacy: Not on file   Past Medical History:  Past Medical History:  Diagnosis Date   Eye globe prosthesis    GERD (gastroesophageal reflux disease)    History of blood transfusion 1973   related to abscess burst in my stomach   Hyperlipemia    Hyperlipidemia    Hypertension    Osteoarthritis of back    Lowerback    SVD (spontaneous vaginal delivery)    x 1, baby died at 2 wks of age   Type II diabetes mellitus (HCC)     Past Surgical History:  Procedure Laterality Date   APPENDECTOMY     COLONOSCOPY     DILATATION & CURETTAGE/HYSTEROSCOPY WITH MYOSURE N/A 02/25/2015   Procedure: DILATATION & CURETTAGE/HYSTEROSCOPY WITH MYOSURE;  Surgeon: Truman Corona, MD;  Location: [facility];  Service: Gynecology;  Laterality: N/A;   DILATION AND CURETTAGE OF UTERUS     ENUCLEATION Right 03/16/2017   ENUCLEATION Right 03/16/2017   Procedure: ENUCLEATION RIGHT EYE;  Surgeon: Loyd Kathryne Palm, MD;  Location: [facility] OR;  Service: Ophthalmology;  Laterality: Right;   EYE SURGERY     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6, 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   EYE SURGERY Right ~ 1974   S/P initial eye injury; scissors stuck in my eye   LAPAROSCOPIC CHOLECYSTECTOMY     SHOULDER ARTHROSCOPY WITH ROTATOR CUFF REPAIR Left    wire stitches per patient   SHOULDER ARTHROSCOPY WITH ROTATOR CUFF REPAIR Right     Current Medications: Current Facility-Administered Medications  Medication Dose  Route Frequency Provider Last Rate Last Admin   acetaminophen  (TYLENOL ) tablet 650 mg  650 mg Oral Q6H PRN Tex Drilling, NP   650 mg at 12/07/24 2126   alum & mag hydroxide-simeth (MAALOX/MYLANTA) 200-200-20 MG/5ML suspension 30 mL  30 mL Oral Q4H PRN Nkwenti, Drilling, NP       cloNIDine  (CATAPRES ) tablet 0.1 mg  0.1 mg Oral BID PRN Tex Drilling, NP       divalproex  (DEPAKOTE ) DR tablet 1,500 mg  1,500 mg Oral QHS Avner Stroder, MD   1,500 mg at 12/07/24 2126   divalproex  (DEPAKOTE ) DR tablet 500 mg  500 mg Oral q AM Domenick Quebedeaux, MD   500 mg at 12/08/24 [phone]  losartan  (COZAAR ) tablet 25 mg  25 mg Oral Daily Nkwenti, Doris, NP   25 mg at 12/08/24 9071   magnesium  hydroxide (MILK OF MAGNESIA) suspension 30 mL  30 mL Oral Daily PRN Tex Drilling, NP       menthol  (CEPACOL) lozenge 3 mg  1 lozenge Oral PRN Bobbitt, Shalon E, NP   3 mg at 12/08/24 0934   methimazole  (TAPAZOLE ) tablet 10 mg  10 mg Oral Daily Tex Drilling, NP   10 mg at 12/08/24 9071   OLANZapine  (ZYPREXA ) injection 5 mg  5 mg Intramuscular TID PRN Tex Drilling, NP       OLANZapine  (ZYPREXA ) injection 5 mg  5 mg Intramuscular TID PRN Tex Drilling, NP       OLANZapine  (ZYPREXA ) tablet 20 mg  20 mg Oral QHS Levan Aloia, MD   20 mg at 12/07/24 2127   OLANZapine  zydis (ZYPREXA ) disintegrating tablet 5 mg  5 mg Oral TID PRN Tex Drilling, NP       OLANZapine  zydis (ZYPREXA ) disintegrating tablet 5 mg  5 mg Oral TID PRN Tex Drilling, NP       paliperidone  (INVEGA ) 24 hr tablet 3 mg  3 mg Oral Daily Shrivastava, Aryendra, MD   3 mg at 12/08/24 9071   pantoprazole  (PROTONIX ) EC tablet 40 mg  40 mg Oral Daily Nkwenti, Doris, NP   40 mg at 12/08/24 9071   traZODone  (DESYREL ) tablet 50 mg  50 mg Oral QHS PRN Tex Drilling, NP   50 mg at 12/06/24 2109    Lab Results:  No results found for this or any previous visit (from the past 48 hours).       Blood Alcohol level:  Lab Results  Component Value Date   [facility] <15  10/14/2024   ETH <10 07/02/2022    Metabolic Disorder Labs: Lab Results  Component Value Date   HGBA1C 5.5 10/14/2024   MPG 111.15 10/14/2024   MPG 137 01/06/2022   Lab Results  Component Value Date   PROLACTIN 5.8 10/14/2024   Lab Results  Component Value Date   CHOL 167 10/14/2024   TRIG 68 10/14/2024   HDL 60 10/14/2024   CHOLHDL 2.8 10/14/2024   VLDL 14 10/14/2024   LDLCALC 93 10/14/2024   LDLCALC 73 03/13/2024    Physical Findings: AIMS:  , ,  ,  ,    CIWA:    COWS:      Psychiatric Specialty Exam:  Presentation  General Appearance:  Appropriate for Environment  Eye Contact: Fleeting  Speech: Normal Rate  Speech Volume: Decreased    Mood and Affect  Mood:fine  Affect: Flat   Thought Process  Thought Processes: impoverished  Orientation:Partial  Thought Content: Chronic delusions Hallucinations: Denies  Ideas of Reference:Delusions; Paranoia  Suicidal Thoughts: Denies  Homicidal Thoughts: Denies   Sensorium  Memory: Immediate Fair; Recent Fair  Judgment: Impaired  Insight: Shallow   Executive Functions  Concentration: Fair  Attention Span: Fair  Recall: Poor  Fund of Knowledge: Fair  Language: Fair   Psychomotor Activity  Psychomotor Activity: No data recorded  Musculoskeletal: Strength & Muscle Tone: within normal limits Gait & Station: normal Assets  Assets: Manufacturing Systems Engineer; Desire for Improvement; Social Support    Physical Exam: Physical Exam Vitals and nursing note reviewed.    ROS Blood pressure (!) 152/69, pulse 98, temperature 98.3 F (36.8 C), resp. rate 18, height 5' 8 (1.727 m), weight 48.3 kg, SpO2 100%. Body mass index is 16.19  kg/m.  Diagnosis: Principal Problem:   Schizophrenia, paranoid (HCC) Major neurocognitive disorder-slums total score of 13/30   Treatment Plan Summary: APS took legal guardianship and  working further placement at appropriate level of  care  Safety and Monitoring:             -- Involuntary admission to inpatient psychiatric unit for safety, stabilization and treatment             -- Daily contact with patient to assess and evaluate symptoms and progress in treatment             -- Patient's case to be discussed in multi-disciplinary team meeting             -- Observation Level: q15 minute checks             -- Vital signs:  q12 hours             -- Precautions: suicide, elopement, and assault   2. Psychiatric Diagnoses and Treatment:  Depakote  levels 11/20/2024-60 Invega  6 mg Depakote  500 mg every morning and 1500 mg  Zyprexa  20 mg nightly  -- The risks/benefits/side-effects/alternatives to this medication were discussed in detail with the patient and time was given for questions. The patient consents to medication trial.                -- Metabolic profile and EKG monitoring obtained while on an atypical antipsychotic (BMI: Lipid Panel: HbgA1c: QTc:)              -- Encouraged patient to participate in unit milieu and in scheduled group therapies   Occupational Therapy recommendations If plan is discharge home, recommend the following:   Direct supervision/assist for medications management;Direct supervision/assist for financial management;Assist for transportation;Assistance with cooking/housework    Ms Wist was seen for OT treatment on this date. Upon arrival to room pt in dayroom, agreeable to tx. The SLUMS is a 30-point screening questionnaire that tests orientation, memory, attention, problem solving, and executive function. Pt scored a 13/30 indicating Dementia. Of note, it is not within occupational therapy scope of practice to diagnose cognitive impairments, this screen indicates need for further testing. Pt with noted impairments in memory and problem solving limiting ability to participate functionally in medication management, bill management, and safety cooking. Will continue to follow POC. Continue to  recommend SUPERVISION for safety with iADLs.    [facility] Mental Status Examination Orientation: 2/3  Calculations: 0/3 Naming animals: 2/3 Patient named 14 animals (0 points is 0-4 animals; 1 is 5-9 animals; 2 is 10-14 animals; 3 is 15+ animals) Recall: 3/5  Attention: 1/2 Clock drawing: 1/4 Visual Processing: 2/2 Paragraph Memory: 2/8  Total: 13/30;  Given that patient has completed high school, this score falls in the dementia range.   4. Discharge Planning:   -- Social work and case management to assist with discharge planning and identification of hospital follow-up needs prior to discharge  -- Estimated LOS: 3-4 days Teige [email or web address]

## 2025-01-05 NOTE — Group Note (Signed)
 Date:  01/05/2025 Time:  10:35 AM  Group Topic/Focus:  Goals Group:   The focus of this group is to help patients establish daily goals to achieve during treatment and discuss how the patient can incorporate goal setting into their daily lives to aide in recovery.    Participation Level:  Minimal  Participation Quality:  Attentive and Resistant  Affect:  Appropriate and Flat  Cognitive:  Alert and Lacking  Insight: Good  Engagement in Group:  Limited and Resistant  Modes of Intervention:  Discussion  Additional Comments:     Maglione,Yuritzi Kamp E 01/05/2025, 10:35 AM

## 2025-01-05 NOTE — BH IP Treatment Plan (Signed)
 Interdisciplinary Treatment and Diagnostic Plan Update  01/05/2025 Time of Session: 3:12 pm Shari [name] MRN: [id number]  Principal Diagnosis: Schizophrenia, paranoid (HCC)  Secondary Diagnoses: Principal Problem:   Schizophrenia, paranoid (HCC)   Current Medications:  Current Facility-Administered Medications  Medication Dose Route Frequency Provider Last Rate Last Admin   acetaminophen  (TYLENOL ) tablet 650 mg  650 mg Oral Q6H PRN Tex Drilling, NP   650 mg at 01/05/25 0811   alum & mag hydroxide-simeth (MAALOX/MYLANTA) 200-200-20 MG/5ML suspension 30 mL  30 mL Oral Q4H PRN Tex Drilling, NP       cloNIDine  (CATAPRES ) tablet 0.1 mg  0.1 mg Oral BID PRN Tex Drilling, NP       diclofenac  Sodium (VOLTAREN ) 1 % topical gel 4 g  4 g Topical QID PRN Jadapalle, Sree, MD       divalproex  (DEPAKOTE ) DR tablet 1,500 mg  1,500 mg Oral QHS Jadapalle, Sree, MD   1,500 mg at 01/04/25 2122   divalproex  (DEPAKOTE ) DR tablet 500 mg  500 mg Oral q AM Jadapalle, Sree, MD   500 mg at 01/05/25 9195   famotidine  (PEPCID ) 40 MG/5ML suspension 40 mg  40 mg Oral QHS Jadapalle, Sree, MD   40 mg at 01/04/25 2123   feeding supplement (ENSURE PLUS HIGH PROTEIN) liquid 237 mL  237 mL Oral BID BM Jadapalle, Sree, MD   237 mL at 01/05/25 1021   lidocaine  (LIDODERM ) 5 % 1 patch  1 patch Transdermal Daily PRN Jadapalle, Sree, MD       losartan  (COZAAR ) tablet 25 mg  25 mg Oral Daily Nkwenti, Doris, NP   25 mg at 01/05/25 1009   magnesium  hydroxide (MILK OF MAGNESIA) suspension 30 mL  30 mL Oral Daily PRN Tex Drilling, NP       menthol  (CEPACOL) lozenge 3 mg  1 lozenge Oral PRN Bobbitt, Shalon E, NP   3 mg at 12/30/24 2127   methimazole  (TAPAZOLE ) tablet 10 mg  10 mg Oral Daily Nkwenti, Doris, NP   10 mg at 01/05/25 1009   multivitamin with minerals tablet 1 tablet  1 tablet Oral Daily Jadapalle, Sree, MD   1 tablet at 01/05/25 1009   OLANZapine  (ZYPREXA ) injection 5 mg  5 mg Intramuscular TID PRN Tex Drilling, NP        OLANZapine  (ZYPREXA ) injection 5 mg  5 mg Intramuscular TID PRN Tex Drilling, NP       OLANZapine  (ZYPREXA ) tablet 20 mg  20 mg Oral QHS Jadapalle, Sree, MD   20 mg at 01/04/25 2122   OLANZapine  zydis (ZYPREXA ) disintegrating tablet 5 mg  5 mg Oral TID PRN Tex Drilling, NP       OLANZapine  zydis (ZYPREXA ) disintegrating tablet 5 mg  5 mg Oral TID PRN Tex Drilling, NP       paliperidone  (INVEGA ) 24 hr tablet 6 mg  6 mg Oral Daily Jadapalle, Sree, MD   6 mg at 01/05/25 1008   pantoprazole  (PROTONIX ) EC tablet 40 mg  40 mg Oral BID AC Zhang, Ping T, MD   40 mg at 01/05/25 0804   sucralfate  (CARAFATE ) 1 GM/10ML suspension 1 g  1 g Oral TID WC & HS Laurita Manor T, MD   1 g at 01/05/25 1159   traZODone  (DESYREL ) tablet 50 mg  50 mg Oral QHS PRN Tex Drilling, NP   50 mg at 01/04/25 2122   PTA Medications: Medications Prior to Admission  Medication Sig Dispense Refill Last Dose/Taking  Ascorbic Acid (VITAMIN C PO) Take 1 tablet by mouth daily. (Patient not taking: Reported on 10/14/2024)      divalproex  (DEPAKOTE ) 500 MG DR tablet Take 1 tablet (500 mg total) by mouth at bedtime. (Patient not taking: Reported on 10/14/2024) 30 tablet 1    losartan  (COZAAR ) 25 MG tablet Take 1 tablet (25 mg total) by mouth daily. (Patient not taking: Reported on 10/14/2024) 90 tablet 1    methimazole  (TAPAZOLE ) 10 MG tablet Take 1 tablet (10 mg total) by mouth daily. (Patient not taking: Reported on 10/14/2024) 90 tablet 1    OLANZapine  (ZYPREXA ) 10 MG tablet Take 1 tablet (10 mg total) by mouth at bedtime. (Patient not taking: Reported on 10/14/2024) 30 tablet 1    pantoprazole  (PROTONIX ) 40 MG tablet Take 1 tablet (40 mg total) by mouth daily. (Patient not taking: Reported on 10/14/2024) 90 tablet 0    propranolol  (INDERAL ) 10 MG tablet Take 1 tablet (10 mg total) by mouth 2 (two) times daily. (Patient not taking: Reported on 10/14/2024) 180 tablet 1    VITAMIN D PO Take 1 tablet by mouth daily. (Patient  not taking: Reported on 10/14/2024)      VITAMIN E PO Take 1 tablet by mouth daily. (Patient not taking: Reported on 10/14/2024)       Patient Stressors: Medication change or noncompliance    Patient Strengths: Communication skills   Treatment Modalities: Medication Management, Group therapy, Case management,  1 to 1 session with clinician, Psychoeducation, Recreational therapy.   Physician Treatment Plan for Primary Diagnosis: Schizophrenia, paranoid (HCC) Long Term Goal(s): Improvement in symptoms so as ready for discharge   Short Term Goals: Ability to identify changes in lifestyle to reduce recurrence of condition will improve Ability to verbalize feelings will improve Ability to disclose and discuss suicidal ideas Ability to demonstrate self-control will improve Ability to identify and develop effective coping behaviors will improve Ability to maintain clinical measurements within normal limits will improve  Medication Management: Evaluate patient's response, side effects, and tolerance of medication regimen.  Therapeutic Interventions: 1 to 1 sessions, Unit Group sessions and Medication administration.  Evaluation of Outcomes: Adequate for Discharge  Physician Treatment Plan for Secondary Diagnosis: Principal Problem:   Schizophrenia, paranoid (HCC)  Long Term Goal(s): Improvement in symptoms so as ready for discharge   Short Term Goals: Ability to identify changes in lifestyle to reduce recurrence of condition will improve Ability to verbalize feelings will improve Ability to disclose and discuss suicidal ideas Ability to demonstrate self-control will improve Ability to identify and develop effective coping behaviors will improve Ability to maintain clinical measurements within normal limits will improve     Medication Management: Evaluate patient's response, side effects, and tolerance of medication regimen.  Therapeutic Interventions: 1 to 1 sessions, Unit Group  sessions and Medication administration.  Evaluation of Outcomes: Adequate for Discharge   RN Treatment Plan for Primary Diagnosis: Schizophrenia, paranoid (HCC) Long Term Goal(s): Knowledge of disease and therapeutic regimen to maintain health will improve  Short Term Goals: Ability to remain free from injury will improve, Ability to verbalize frustration and anger appropriately will improve, Ability to demonstrate self-control, Ability to participate in decision making will improve, Ability to verbalize feelings will improve, Ability to identify and develop effective coping behaviors will improve, Compliance with prescribed medications will improve, and    Medication Management: RN will administer medications as ordered by provider, will assess and evaluate patient's response and provide education to patient for prescribed medication. RN  will report any adverse and/or side effects to prescribing provider.  Therapeutic Interventions: 1 on 1 counseling sessions, Psychoeducation, Medication administration, Evaluate responses to treatment, Monitor vital signs and CBGs as ordered, Perform/monitor CIWA, COWS, AIMS and Fall Risk screenings as ordered, Perform wound care treatments as ordered.  Evaluation of Outcomes: Adequate for Discharge   LCSW Treatment Plan for Primary Diagnosis: Schizophrenia, paranoid (HCC) Long Term Goal(s): Safe transition to appropriate next level of care at discharge, Engage patient in therapeutic group addressing interpersonal concerns.  Short Term Goals: Engage patient in aftercare planning with referrals and resources, Increase social support, Increase ability to appropriately verbalize feelings, and Increase emotional regulation  Therapeutic Interventions: Assess for all discharge needs, 1 to 1 time with Social worker, Explore available resources and support systems, Assess for adequacy in community support network, Educate family and significant other(s) on suicide  prevention, Complete Psychosocial Assessment, Interpersonal group therapy.  Evaluation of Outcomes: Adequate for Discharge   Progress in Treatment: Attending groups: No. Participating in groups: No. Taking medication as prescribed: Yes. Toleration medication: Yes. Family/Significant other contact made: No, will contact:  Once permission is provided Patient understands diagnosis: No. Discussing patient identified problems/goals with staff: Yes. Medical problems stabilized or resolved: Yes. Denies suicidal/homicidal ideation: Yes. Issues/concerns per patient self-inventory: No. Other: None  New problem(s) identified: 12/14/24 no new changes  Update 01/05/2025: No changes at this time.   New Short Term/Long Term Goal(s):Update 12/14/2024 no new changes  Update 12/19/2024: No changes at this time.  Update 12/25/2023: No changes at this time. Update 12/31/2023: No changes at this time.  Update 01/05/2025: No changes at this time.   Patient Goals:    I haven't set any goals for the hospital yet. I don't need psychiatric help 10/23/24 Update: No changes at this time. 10/28/24 Update: No changes at this time. Update 11/03/24: No changes at this time Update 11/09/24: No changes at this time Update 11/14/24: No changes at this time2/21/25 Update: No changes at this time. Update 11/29/2024: No changes at this time.   Update 12/04/2024: No changes at this time. Update 12/09/2024: No changes at this time.   12/14/24 no new changes  Update 12/19/2024: No changes at this time. Update 12/25/2023: No changes at this time. Update 12/31/2023: No changes at this time.  Update 01/05/2025: No changes at this time.   Discharge Plan or Barriers: CSW will assist with appropriate discharge plan 10/23/24 Update: No changes at this time. 10/28/24 Update: CSW to continue to meet with DSS to engage in safe discharge planning and connect patient with appropriate resources. Update 11/03/24: No changes at this time Update 11/09/24:  No changes at this time Update 11/14/24: APS drafting a petition for guardianship, awaiting updates regarding scheduled court date at this time. APS has received FL2 to begin placement search. 11/24/24 Update: No changes at this time.  Update 11/29/2024: Placement continues to be sought at this time.  Update 12/04/2024: No updates on placement search at this time Update 12/09/2024: No changes at this time.  Update:12/14/24 no new changes  Update 12/19/2024: No changes at this time. Update 12/25/2023: APS continues to look for placement for patient. No plan identified. Update 12/31/2023: No changes at this time.   Update 01/05/2025: No changes at this time.     Reason for Continuation of Hospitalization: Delusions  12/14/24 no changes   Estimated Length of Stay: Update 12/14/2024: TBD  Update 12/19/2024: TBD Update 12/25/2023: TBD Update 12/31/2023: TBD  Update 01/05/2025: TBD  Last  3 [place] Suicide Severity Risk Score: Flowsheet Row Admission (Current) from 10/17/2024 in [facility] from 10/14/2024 in [facility] ED to Hosp-Admission (Discharged) from 03/13/2024 in [facility]  C-SSRS RISK CATEGORY No Risk No Risk No Risk    Last PHQ 2/9 Scores:     No data to display          Scribe for Treatment Team: Roselyn GORMAN Lento, LCSW 01/05/2025 3:12 PM

## 2025-01-05 NOTE — Plan of Care (Signed)
   Problem: Education: Goal: Emotional status will improve Outcome: Progressing Goal: Mental status will improve Outcome: Progressing Goal: Verbalization of understanding the information provided will improve Outcome: Progressing   Problem: Activity: Goal: Interest or engagement in activities will improve Outcome: Progressing

## 2025-01-05 NOTE — Group Note (Signed)
 Date:  01/05/2025 Time:  9:23 PM  Group Topic/Focus:  Wrap-Up Group:   The focus of this group is to help patients review their daily goal of treatment and discuss progress on daily workbooks.    Participation Level:  Active  Participation Quality:  Appropriate  Affect:  Appropriate  Cognitive:  Alert  Insight: Appropriate  Engagement in Group:  Engaged  Modes of Intervention:  Discussion  Additional Comments:    Shari [name] 01/05/2025, 9:23 PM

## 2025-01-05 NOTE — Progress Notes (Addendum)
 Behavior:  Pleasant and cooperative.  Present in the milieu.    Psych assessment:  Denies SI/HI and AVH.  Group attendance:  1/1  Medication/ PRNs: Compliant.  PRN pain medication given as ordered.    Pain: 7/10 in neck/shoulder  15 min checks in place for safety.

## 2025-01-05 NOTE — Progress Notes (Signed)
 Pt denies SI/HI/AVH. She slept well throughout the night with PRN Trazodone  50 mg.  Q 15 min safety checks in place.   01/04/25 2122  Psych Admission Type (Psych Patients Only)  Admission Status Involuntary  Psychosocial Assessment  Patient Complaints None  Eye Contact Fair  Facial Expression Other (Comment) (wnl)  Affect Appropriate to circumstance  Speech Logical/coherent  Interaction Minimal  Motor Activity Slow  Appearance/Hygiene Unremarkable  Behavior Characteristics Appropriate to situation  Mood Pleasant  Thought Process  Coherency WDL  Content WDL  Delusions None reported or observed  Perception WDL  Hallucination None reported or observed  Judgment WDL  Confusion None  Danger to Self  Current suicidal ideation? Denies  Danger to Others  Danger to Others None reported or observed

## 2025-01-06 NOTE — Plan of Care (Signed)

## 2025-01-06 NOTE — Group Note (Signed)
 Physical/Occupational Therapy Group Note  Group Topic: Functional, Dynamic Balance   Group Date: 01/06/2025 Start Time: 1300 End Time: 1330 Facilitators: Joshalyn Ancheta, Alm Hamilton, PT   Group Description: Group discussed impact of balance on safety and independence with functional tasks.  Identified and discussed any self-perceived balance deficits to personalize information.  Discussed and reviewed strategies to address/improve balance deficits: use of assist devices, activity pacing/energy conservation, environment/home safety modifications, focusing attention/minimizing distraction.  Reviewed and participated with standing LE therex designed to target dynamic balance reactions and LE strength/stability; provided handouts with HEP to be utilized outside of group time as appropriate.  Allowed time for questions and further discussion on any balance or mobility concerns/needs.  Therapeutic Goal(s):  Identify and discuss any individual balance deficits and functional implications. Identify and discuss any environmental/home safety modifications that can optimize balance and safety for mobility within the home. Demonstrate understanding and performance of standing therex designed to target dynamic balance deficits.  Individual Participation: Did not attend  Participation Level:   Participation Quality:   Behavior:   Speech/Thought Process:   Affect/Mood:   Insight:   Judgement:   Modes of Intervention:   Patient Response to Interventions:    Plan: Continue to engage patient in PT/OT groups 1 - 2x/week.  CHARM Hamilton Bertin PT, DPT 01/06/25, 1:45 PM

## 2025-01-06 NOTE — Progress Notes (Signed)
 Patient has been visible in Milieu interacting well with Peers and Staff compliant with treatment. Denies SI/HI/A/VH and verbally contract for safety.Q 15 minutes safety checks ongoing. No adverse effects noted.

## 2025-01-06 NOTE — Progress Notes (Signed)
 Pt A&Ox4. She denies SI/HI/AVH and physical complaints. [name] med compliant and slept well with PRN Trazodone  50 mg. Q 15 min safety checks in place.   01/05/25 2106  Psych Admission Type (Psych Patients Only)  Admission Status Involuntary  Psychosocial Assessment  Patient Complaints None  Eye Contact Fair  Facial Expression Other (Comment) (wnl)  Affect Appropriate to circumstance  Speech Soft  Interaction Minimal  Motor Activity Slow  Appearance/Hygiene Unremarkable  Behavior Characteristics Appropriate to situation  Mood Pleasant  Thought Process  Coherency WDL  Content WDL  Delusions None reported or observed  Perception WDL  Hallucination None reported or observed  Judgment Limited  Confusion None  Danger to Self  Current suicidal ideation? Denies  Danger to Others  Danger to Others None reported or observed

## 2025-01-06 NOTE — Group Note (Signed)
 Recreation Therapy Group Note   Group Topic:General Recreation  Group Date: 01/06/2025 Start Time: 1405 End Time: 1455 Facilitators: Celestia Jeoffrey BRAVO, LRT, CTRS Location: Dayroom  Group Description: Bingo. Patients played multiple rounds of bingo. LRT and pts discussed the definition of leisure, things they do in their free time outside of the hospital, and how bingo is also a leisure activity. Pts received a coloring book, word search book, or journal as a prize.    Goal Area(s) Addressed:  Patient will identify a current leisure interest.  Patient will learn the definition of leisure. Patient will have the opportunity to try a new leisure activity. Patient will communicate with peers and LRT.   Affect/Mood: N/A   Participation Level: Did not attend    Clinical Observations/Individualized Feedback: Patient did not attend.  Plan: Continue to engage patient in RT group sessions 2-3x/week.   Jeoffrey BRAVO Celestia, LRT, CTRS 01/06/2025 4:53 PM

## 2025-01-06 NOTE — BHH Counselor (Signed)
 CSW sent FL2 to [name] on Friday, 01/03/25. Arch sent FL2 to Hydia at [employer] for review.   CSW sent message to [facility] to check in about progression of placement. Arch reports she will contact Hydia.   CSW awaits updates at this time.   Lum Croft, MSW, [place] 01/06/2025 10:20 AM

## 2025-01-06 NOTE — Plan of Care (Signed)
  Problem: Education: Goal: Emotional status will improve Outcome: Progressing   Problem: Activity: Goal: Interest or engagement in activities will improve Outcome: Progressing Goal: Sleeping patterns will improve Outcome: Progressing

## 2025-01-07 NOTE — Group Note (Signed)
 Date:  01/07/2025 Time:  4:33 PM  Group Topic/Focus:  Developing a Wellness Toolbox:   The focus of this group is to help patients develop a wellness toolbox with skills and strategies to promote recovery upon discharge.    Participation Level:  Did Not Attend  Participation Quality:    Affect:    Cognitive:    Insight:   Engagement in Group:    Modes of Intervention:    Additional Comments:    Roran Wegner 01/07/2025, 4:33 PM

## 2025-01-07 NOTE — Plan of Care (Signed)
" °  Problem: Education: Goal: Emotional status will improve Outcome: Progressing   Problem: Coping: Goal: Ability to verbalize frustrations and anger appropriately will improve Outcome: Progressing Goal: Ability to demonstrate self-control will improve Outcome: Progressing   Problem: Education: Goal: Mental status will improve Outcome: Not Progressing Goal: Verbalization of understanding the information provided will improve Outcome: Not Progressing   "

## 2025-01-07 NOTE — Plan of Care (Signed)
  Problem: Education: Goal: Emotional status will improve Outcome: Progressing   Problem: Activity: Goal: Interest or engagement in activities will improve Outcome: Progressing Goal: Sleeping patterns will improve Outcome: Progressing

## 2025-01-07 NOTE — Progress Notes (Signed)
 Patient responsive and pleasant during interaction. Patient present in dayroom for meals and group. Denies SI/HI/AVH. No PRNs or concerns this shift. Ongoing monitoring continues.   01/07/25 1100  Psych Admission Type (Psych Patients Only)  Admission Status Involuntary  Psychosocial Assessment  Patient Complaints None  Eye Contact Fair  Facial Expression Flat  Affect Appropriate to circumstance  Speech Soft  Interaction Minimal  Motor Activity Slow  Appearance/Hygiene Unremarkable  Behavior Characteristics Appropriate to situation  Mood Pleasant  Thought Process  Coherency WDL  Content WDL  Delusions None reported or observed  Perception WDL  Hallucination None reported or observed  Judgment Limited  Confusion None  Danger to Self  Current suicidal ideation? Denies  Agreement Not to Harm Self Yes  Description of Agreement verbal  Danger to Others  Danger to Others None reported or observed

## 2025-01-08 NOTE — Plan of Care (Signed)
   Problem: Education: Goal: Emotional status will improve Outcome: Progressing Goal: Mental status will improve Outcome: Progressing Goal: Verbalization of understanding the information provided will improve Outcome: Progressing   Problem: Activity: Goal: Interest or engagement in activities will improve Outcome: Progressing Goal: Sleeping patterns will improve Outcome: Progressing

## 2025-01-08 NOTE — Group Note (Signed)
 [facility] LCSW Group Therapy Note   Group Date: 01/07/2025 Start Time: 1330 End Time: 1345  Type of Therapy/Topic:  Group Therapy:  Feelings about Diagnosis  Participation Level:  Did Not Attend   Mood: X   Description of Group:    This group will allow patients to explore their thoughts and feelings about diagnoses they have received. Patients will be guided to explore their level of understanding and acceptance of these diagnoses. Facilitator will encourage patients to process their thoughts and feelings about the reactions of others to their diagnosis, and will guide patients in identifying ways to discuss their diagnosis with significant others in their lives. This group will be process-oriented, with patients participating in exploration of their own experiences as well as giving and receiving support and challenge from other group members.   Therapeutic Goals: 1. Patient will demonstrate understanding of diagnosis as evidence by identifying two or more symptoms of the disorder:  2. Patient will be able to express two feelings regarding the diagnosis 3. Patient will demonstrate ability to communicate their needs through discussion and/or role plays  Summary of Patient Progress:    X    Therapeutic Modalities:   Cognitive Behavioral Therapy Brief Therapy Feelings Identification    Shari [name], LCSWA

## 2025-01-08 NOTE — Group Note (Signed)
 Date:  01/08/2025 Time:  9:20 PM  Group Topic/Focus:  Wrap-Up Group:   The focus of this group is to help patients review their daily goal of treatment and discuss progress on daily workbooks.    Participation Level:  Active  Participation Quality:  Appropriate  Affect:  Appropriate  Cognitive:  Alert  Insight: Appropriate  Engagement in Group:  Engaged  Modes of Intervention:  Discussion  Additional Comments:    Shari [name] 01/08/2025, 9:20 PM

## 2025-01-08 NOTE — Group Note (Signed)
 Date:  01/08/2025 Time:  9:45 AM  Group Topic/Focus:  Movement Therapy, Morning stretch with Denis Koppel.    Participation Level:  Did Not Attend    [name] 01/08/2025, 9:45 AM

## 2025-01-08 NOTE — Progress Notes (Signed)
 [facility] MD Progress Note  Patient is a 69 year old female with a history of Schizoaffective Disorder, Bipolar Type who presents via [facility] under IVC, initiated by CM, Falencio with re-entry program, to [facility] for assessment. Per IVC, [name] has been diagnosed with Schizophrenia and is refusing to take medication. [name] isn't sleeping. Residents report her screaming and yelling all night long.  [name] responding to internal stimuli.  [name] verbally aggressive.  The respondent has locked all of the residents inside of the transitional housing home.  They could not leave the home which prompted the IVC.  Residents did not have access to food, the restroom or anything as a result of being locked in.  [name] is currently under Adult protective services care. Patient is admitted to [facility] unit with Q15 min safety monitoring. Multidisciplinary team approach is offered. Medication management; group/milieu therapy is offered.   The patient remains psychiatrically stable at this time. [name] awaiting placement, and continued inpatient care is medically necessary primarily due to disposition considerations rather than acute psychiatric decompensation.  Subjective:  Chart reviewed, case discussed in multidisciplinary meeting, patient seen during rounds.   Patient is noted to be resting in bed.  She offers no complaints.  She remains minimally engaging in and discussing her discharge planning and finding a new placement.  She remains adamant about her previous placement being her home.  She denies any pain.  Per nursing patient is taking her medications with no reported side effects.  She denies depression or anxiety.  [name] not endorsing SI/HI/plan.   Psychiatric History: see h&P Family History:  Family History  Problem Relation Age of Onset   Diabetes Mother    CAD Mother    Lung cancer Father    Social History:  Social History   Substance and Sexual Activity  Alcohol  Use Yes   Comment: beer occasionally     Social History   Substance and Sexual Activity  Drug Use No    Social History   Socioeconomic History   Marital status: Widowed    Spouse name: Not on file   Number of children: Not on file   Years of education: Not on file   Highest education level: Not on file  Occupational History   Not on file  Tobacco Use   Smoking status: Every Day    Current packs/day: 0.50    Average packs/day: 0.5 packs/day for 40.0 years (20.0 ttl pk-yrs)    Types: Cigarettes   Smokeless tobacco: Never  Vaping Use   Vaping status: Never Used  Substance and Sexual Activity   Alcohol use: Yes    Comment: beer occasionally   Drug use: No   Sexual activity: Not Currently    Birth control/protection: Post-menopausal  Other Topics Concern   Not on file  Social History Narrative   Not on file   Social Drivers of Health   Tobacco Use: High Risk (11/02/2024)   Patient History    Smoking Tobacco Use: Every Day    Smokeless Tobacco Use: Never    Passive Exposure: Not on file  Financial Resource Strain: Not on file  Food Insecurity: No Food Insecurity (10/17/2024)   Epic    Worried About Programme Researcher, Broadcasting/film/video in the Last Year: Never true    Ran Out of Food in the Last Year: Never true  Transportation Needs: No Transportation Needs (10/17/2024)   Epic    Lack of Transportation (Medical): No  Lack of Transportation (Non-Medical): No  Recent Concern: Transportation Needs - Unmet Transportation Needs (10/14/2024)   Epic    Lack of Transportation (Medical): Yes    Lack of Transportation (Non-Medical): Yes  Physical Activity: Not on file  Stress: Not on file  Social Connections: Unknown (10/17/2024)   Social Connection and Isolation Panel    Frequency of Communication with Friends and Family: Patient unable to answer    Frequency of Social Gatherings with Friends and Family: Patient unable to answer    Attends Religious Services: Patient unable to answer     Active Member of Clubs or Organizations: Patient declined    Attends Banker Meetings: Patient unable to answer    Marital Status: Widowed  Depression (PHQ2-9): Not on file  Alcohol Screen: Low Risk (10/17/2024)   Alcohol Screen    Last Alcohol Screening Score (AUDIT): 0  Housing: Low Risk (10/17/2024)   Epic    Unable to Pay for Housing in the Last Year: No    Number of Times Moved in the Last Year: 0    Homeless in the Last Year: No  Utilities: Not At Risk (10/17/2024)   Epic    Threatened with loss of utilities: No  Health Literacy: Not on file   Past Medical History:  Past Medical History:  Diagnosis Date   Eye globe prosthesis    GERD (gastroesophageal reflux disease)    History of blood transfusion 1973   related to abscess burst in my stomach   Hyperlipemia    Hyperlipidemia    Hypertension    Osteoarthritis of back    Lowerback    SVD (spontaneous vaginal delivery)    x 1, baby died at 2 wks of age   Type II diabetes mellitus (HCC)     Past Surgical History:  Procedure Laterality Date   APPENDECTOMY     COLONOSCOPY     DILATATION & CURETTAGE/HYSTEROSCOPY WITH MYOSURE N/A 02/25/2015   Procedure: DILATATION & CURETTAGE/HYSTEROSCOPY WITH MYOSURE;  Surgeon: Truman Corona, MD;  Location: [facility];  Service: Gynecology;  Laterality: N/A;   DILATION AND CURETTAGE OF UTERUS     ENUCLEATION Right 03/16/2017   ENUCLEATION Right 03/16/2017   Procedure: ENUCLEATION RIGHT EYE;  Surgeon: Loyd Kathryne Palm, MD;  Location: [facility] OR;  Service: Ophthalmology;  Laterality: Right;   EYE SURGERY     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6, 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   EYE SURGERY Right ~ 1974   S/P initial eye injury; scissors stuck in my eye   LAPAROSCOPIC CHOLECYSTECTOMY     SHOULDER ARTHROSCOPY WITH ROTATOR CUFF REPAIR Left    wire stitches per patient   SHOULDER ARTHROSCOPY WITH ROTATOR CUFF REPAIR Right     Current Medications: Current Facility-Administered Medications   Medication Dose Route Frequency Provider Last Rate Last Admin   acetaminophen  (TYLENOL ) tablet 650 mg  650 mg Oral Q6H PRN Tex Drilling, NP   650 mg at 12/07/24 2126   alum & mag hydroxide-simeth (MAALOX/MYLANTA) 200-200-20 MG/5ML suspension 30 mL  30 mL Oral Q4H PRN Tex Drilling, NP       cloNIDine  (CATAPRES ) tablet 0.1 mg  0.1 mg Oral BID PRN Tex Drilling, NP       divalproex  (DEPAKOTE ) DR tablet 1,500 mg  1,500 mg Oral QHS Liviah Cake, MD   1,500 mg at 12/07/24 2126   divalproex  (DEPAKOTE ) DR tablet 500 mg  500 mg Oral q AM Davan Hark, MD   500 mg at 12/08/24  9192   losartan  (COZAAR ) tablet 25 mg  25 mg Oral Daily Tex Drilling, NP   25 mg at 12/08/24 0928   magnesium  hydroxide (MILK OF MAGNESIA) suspension 30 mL  30 mL Oral Daily PRN Tex Drilling, NP       menthol  (CEPACOL) lozenge 3 mg  1 lozenge Oral PRN Bobbitt, Shalon E, NP   3 mg at 12/08/24 0934   methimazole  (TAPAZOLE ) tablet 10 mg  10 mg Oral Daily Nkwenti, Doris, NP   10 mg at 12/08/24 9071   OLANZapine  (ZYPREXA ) injection 5 mg  5 mg Intramuscular TID PRN Tex Drilling, NP       OLANZapine  (ZYPREXA ) injection 5 mg  5 mg Intramuscular TID PRN Tex Drilling, NP       OLANZapine  (ZYPREXA ) tablet 20 mg  20 mg Oral QHS Khadeem Rockett, MD   20 mg at 12/07/24 2127   OLANZapine  zydis (ZYPREXA ) disintegrating tablet 5 mg  5 mg Oral TID PRN Tex Drilling, NP       OLANZapine  zydis (ZYPREXA ) disintegrating tablet 5 mg  5 mg Oral TID PRN Tex Drilling, NP       paliperidone  (INVEGA ) 24 hr tablet 3 mg  3 mg Oral Daily Shrivastava, Aryendra, MD   3 mg at 12/08/24 9071   pantoprazole  (PROTONIX ) EC tablet 40 mg  40 mg Oral Daily Nkwenti, Doris, NP   40 mg at 12/08/24 9071   traZODone  (DESYREL ) tablet 50 mg  50 mg Oral QHS PRN Tex Drilling, NP   50 mg at 12/06/24 2109    Lab Results:  No results found for this or any previous visit (from the past 48 hours).       Blood Alcohol level:  Lab Results  Component Value  Date   [facility] <15 10/14/2024   ETH <10 07/02/2022    Metabolic Disorder Labs: Lab Results  Component Value Date   HGBA1C 5.5 10/14/2024   MPG 111.15 10/14/2024   MPG 137 01/06/2022   Lab Results  Component Value Date   PROLACTIN 5.8 10/14/2024   Lab Results  Component Value Date   CHOL 167 10/14/2024   TRIG 68 10/14/2024   HDL 60 10/14/2024   CHOLHDL 2.8 10/14/2024   VLDL 14 10/14/2024   LDLCALC 93 10/14/2024   LDLCALC 73 03/13/2024    Physical Findings: AIMS:  , ,  ,  ,    CIWA:    COWS:      Psychiatric Specialty Exam:  Presentation  General Appearance:  Appropriate for Environment  Eye Contact: Fleeting  Speech: Normal Rate  Speech Volume: Decreased    Mood and Affect  Mood:fine  Affect: Flat   Thought Process  Thought Processes: impoverished  Orientation:Partial  Thought Content: Chronic delusions Hallucinations: Denies  Ideas of Reference:Delusions; Paranoia  Suicidal Thoughts: Denies  Homicidal Thoughts: Denies   Sensorium  Memory: Immediate Fair; Recent Fair  Judgment: Impaired  Insight: Shallow   Executive Functions  Concentration: Fair  Attention Span: Fair  Recall: Poor  Fund of Knowledge: Fair  Language: Fair   Psychomotor Activity  Psychomotor Activity: No data recorded  Musculoskeletal: Strength & Muscle Tone: within normal limits Gait & Station: normal Assets  Assets: Manufacturing Systems Engineer; Desire for Improvement; Social Support    Physical Exam: Physical Exam Vitals and nursing note reviewed.    ROS Blood pressure (!) 152/69, pulse 98, temperature 98.3 F (36.8 C), resp. rate 18, height 5' 8 (1.727 m), weight 48.3 kg, SpO2 100%. Body mass  index is 16.19 kg/m.  Diagnosis: Principal Problem:   Schizophrenia, paranoid (HCC) Major neurocognitive disorder-slums total score of 13/30   Treatment Plan Summary: APS took legal guardianship and  working further placement at appropriate  level of care  Safety and Monitoring:             -- Involuntary admission to inpatient psychiatric unit for safety, stabilization and treatment             -- Daily contact with patient to assess and evaluate symptoms and progress in treatment             -- Patient's case to be discussed in multi-disciplinary team meeting             -- Observation Level: q15 minute checks             -- Vital signs:  q12 hours             -- Precautions: suicide, elopement, and assault   2. Psychiatric Diagnoses and Treatment:  Depakote  levels 11/20/2024-60 Invega  6 mg Depakote  500 mg every morning and 1500 mg  Zyprexa  20 mg nightly  -- The risks/benefits/side-effects/alternatives to this medication were discussed in detail with the patient and time was given for questions. The patient consents to medication trial.                -- Metabolic profile and EKG monitoring obtained while on an atypical antipsychotic (BMI: Lipid Panel: HbgA1c: QTc:)              -- Encouraged patient to participate in unit milieu and in scheduled group therapies   Occupational Therapy recommendations If plan is discharge home, recommend the following:   Direct supervision/assist for medications management;Direct supervision/assist for financial management;Assist for transportation;Assistance with cooking/housework    Ms Loos was seen for OT treatment on this date. Upon arrival to room pt in dayroom, agreeable to tx. The SLUMS is a 30-point screening questionnaire that tests orientation, memory, attention, problem solving, and executive function. Pt scored a 13/30 indicating Dementia. Of note, it is not within occupational therapy scope of practice to diagnose cognitive impairments, this screen indicates need for further testing. Pt with noted impairments in memory and problem solving limiting ability to participate functionally in medication management, bill management, and safety cooking. Will continue to follow POC.  Continue to recommend SUPERVISION for safety with iADLs.    [facility] Mental Status Examination Orientation: 2/3  Calculations: 0/3 Naming animals: 2/3 Patient named 14 animals (0 points is 0-4 animals; 1 is 5-9 animals; 2 is 10-14 animals; 3 is 15+ animals) Recall: 3/5  Attention: 1/2 Clock drawing: 1/4 Visual Processing: 2/2 Paragraph Memory: 2/8  Total: 13/30;  Given that patient has completed high school, this score falls in the dementia range.   4. Discharge Planning:   -- Social work and case management to assist with discharge planning and identification of hospital follow-up needs prior to discharge  -- Estimated LOS: 3-4 days Erby Sanderson.MD

## 2025-01-08 NOTE — Progress Notes (Signed)
" °   01/08/25 2000  Psych Admission Type (Psych Patients Only)  Admission Status Involuntary  Psychosocial Assessment  Patient Complaints None  Eye Contact Fair  Facial Expression Flat  Affect Appropriate to circumstance  Speech Soft  Interaction Minimal  Motor Activity Slow  Appearance/Hygiene Unremarkable  Behavior Characteristics Appropriate to situation;Cooperative  Mood Pleasant  Thought Process  Coherency WDL  Content WDL  Delusions None reported or observed  Perception WDL  Hallucination None reported or observed  Judgment Limited  Confusion None  Danger to Self  Current suicidal ideation? Denies  Self-Injurious Behavior No self-injurious ideation or behavior indicators observed or expressed   Agreement Not to Harm Self Yes  Description of Agreement verbal  Danger to Others  Danger to Others None reported or observed    "

## 2025-01-08 NOTE — Progress Notes (Signed)
 2/ 03/2025 Letter to the Court  Reg: [name] DOB: March 31, 1956  Shari [name] has been hospitalized since October 17, 2024 at [facility]) inpatient psychiatric unit.   Patient is currently treated for delusional disorder, major neurocognitive disorder.  Patient was admitted for paranoid delusions of thinking the residence of a transitional house being intruders in her own home and lock that up the residence for her many hours until cops showed up and rescue to them.  Patient is unable to process and comprehend the ongoing psychiatric problems and the treatment that is needed for her.  She continues to decline placements stating the transitional housing is her house and she needs to go and get everybody out.  Patient is unable to care for self. APS report has been made.  APS is working on the legal guardianship and safe disposition  As this treatment process is time consuming patient has no safe place to go with out supervision.   We appreciate the court's assistance in stabilizing and currently we request another 15 days of inpatient care to further stabilize.  Sincerely, Andrew Soria.MD [facility]

## 2025-01-08 NOTE — Plan of Care (Signed)

## 2025-01-08 NOTE — Progress Notes (Signed)
" °   01/08/25 1000  Psych Admission Type (Psych Patients Only)  Admission Status Involuntary  Psychosocial Assessment  Patient Complaints None  Eye Contact Fair  Facial Expression Flat  Affect Appropriate to circumstance  Speech Soft  Interaction Minimal  Motor Activity Slow  Appearance/Hygiene Unremarkable  Behavior Characteristics Cooperative  Mood Pleasant  Thought Process  Coherency WDL  Content WDL  Delusions None reported or observed  Perception WDL  Hallucination None reported or observed  Judgment Limited  Confusion None  Danger to Self  Current suicidal ideation? Denies  Agreement Not to Harm Self Yes  Description of Agreement verbal  Danger to Others  Danger to Others None reported or observed    "

## 2025-01-08 NOTE — Progress Notes (Addendum)
 Pt A&Ox3; disoriented to situation. Pt denies SI/HI/AVH along with physical complaints this shift. [name] safe on the unit at this time with Q 15 min safety checks in place.   01/07/25 2136  Psych Admission Type (Psych Patients Only)  Admission Status Involuntary  Psychosocial Assessment  Patient Complaints None  Eye Contact Fair  Facial Expression Other (Comment) (wnl)  Affect Appropriate to circumstance  Speech Soft  Interaction Minimal  Motor Activity Slow  Appearance/Hygiene Unremarkable  Behavior Characteristics Cooperative  Mood Pleasant  Thought Process  Coherency WDL  Content WDL  Delusions None reported or observed  Perception WDL  Hallucination None reported or observed  Judgment Limited  Confusion None  Danger to Self  Current suicidal ideation? Denies  Self-Injurious Behavior No self-injurious ideation or behavior indicators observed or expressed   Agreement Not to Harm Self Yes  Description of Agreement verbal  Danger to Others  Danger to Others None reported or observed

## 2025-01-09 NOTE — Progress Notes (Signed)
 [facility] MD Progress Note  Patient is a 69 year old female with a history of Schizoaffective Disorder, Bipolar Type who presents via [employer] under IVC, initiated by CM, Falencio with re-entry program, to [facility] for assessment. Per IVC, [name] has been diagnosed with Schizophrenia and is refusing to take medication. [name] isn't sleeping. Residents report her screaming and yelling all night long.  [name] responding to internal stimuli.  [name] verbally aggressive.  The respondent has locked all of the residents inside of the transitional housing home.  They could not leave the home which prompted the IVC.  Residents did not have access to food, the restroom or anything as a result of being locked in.  [name] is currently under Adult protective services care. Patient is admitted to [facility] unit with Q15 min safety monitoring. Multidisciplinary team approach is offered. Medication management; group/milieu therapy is offered.   The patient remains psychiatrically stable at this time. [name] awaiting placement, and continued inpatient care is medically necessary primarily due to disposition considerations rather than acute psychiatric decompensation.  Subjective:  Chart reviewed, case discussed in multidisciplinary meeting, patient seen during rounds.   Patient is noted to be sitting in the day area.  She offers no complaints asked why she declined to complete her assessment with the placement folks patient reports that she does not need to go to assisted living and reports that she can always go back to her previous house.  Patient offers no other complaints.  Patient denies SI/HI/plan and denies hallucinations.   Psychiatric History: see h&P Family History:  Family History  Problem Relation Age of Onset   Diabetes Mother    CAD Mother    Lung cancer Father    Social History:  Social History   Substance and Sexual Activity  Alcohol Use Yes   Comment: beer  occasionally     Social History   Substance and Sexual Activity  Drug Use No    Social History   Socioeconomic History   Marital status: Widowed    Spouse name: Not on file   Number of children: Not on file   Years of education: Not on file   Highest education level: Not on file  Occupational History   Not on file  Tobacco Use   Smoking status: Every Day    Current packs/day: 0.50    Average packs/day: 0.5 packs/day for 40.0 years (20.0 ttl pk-yrs)    Types: Cigarettes   Smokeless tobacco: Never  Vaping Use   Vaping status: Never Used  Substance and Sexual Activity   Alcohol use: Yes    Comment: beer occasionally   Drug use: No   Sexual activity: Not Currently    Birth control/protection: Post-menopausal  Other Topics Concern   Not on file  Social History Narrative   Not on file   Social Drivers of Health   Tobacco Use: High Risk (11/02/2024)   Patient History    Smoking Tobacco Use: Every Day    Smokeless Tobacco Use: Never    Passive Exposure: Not on file  Financial Resource Strain: Not on file  Food Insecurity: No Food Insecurity (10/17/2024)   Epic    Worried About Programme Researcher, Broadcasting/film/video in the Last Year: Never true    Ran Out of Food in the Last Year: Never true  Transportation Needs: No Transportation Needs (10/17/2024)   Epic    Lack of Transportation (Medical): No    Lack of Transportation (Non-Medical):  No  Recent Concern: Transportation Needs - Unmet Transportation Needs (10/14/2024)   Epic    Lack of Transportation (Medical): Yes    Lack of Transportation (Non-Medical): Yes  Physical Activity: Not on file  Stress: Not on file  Social Connections: Unknown (10/17/2024)   Social Connection and Isolation Panel    Frequency of Communication with Friends and Family: Patient unable to answer    Frequency of Social Gatherings with Friends and Family: Patient unable to answer    Attends Religious Services: Patient unable to answer    Active Member of  Clubs or Organizations: Patient declined    Attends Banker Meetings: Patient unable to answer    Marital Status: Widowed  Depression (PHQ2-9): Not on file  Alcohol Screen: Low Risk (10/17/2024)   Alcohol Screen    Last Alcohol Screening Score (AUDIT): 0  Housing: Low Risk (10/17/2024)   Epic    Unable to Pay for Housing in the Last Year: No    Number of Times Moved in the Last Year: 0    Homeless in the Last Year: No  Utilities: Not At Risk (10/17/2024)   Epic    Threatened with loss of utilities: No  Health Literacy: Not on file   Past Medical History:  Past Medical History:  Diagnosis Date   Eye globe prosthesis    GERD (gastroesophageal reflux disease)    History of blood transfusion 1973   related to abscess burst in my stomach   Hyperlipemia    Hyperlipidemia    Hypertension    Osteoarthritis of back    Lowerback    SVD (spontaneous vaginal delivery)    x 1, baby died at 2 wks of age   Type II diabetes mellitus (HCC)     Past Surgical History:  Procedure Laterality Date   APPENDECTOMY     COLONOSCOPY     DILATATION & CURETTAGE/HYSTEROSCOPY WITH MYOSURE N/A 02/25/2015   Procedure: DILATATION & CURETTAGE/HYSTEROSCOPY WITH MYOSURE;  Surgeon: Truman Corona, MD;  Location: [facility];  Service: Gynecology;  Laterality: N/A;   DILATION AND CURETTAGE OF UTERUS     ENUCLEATION Right 03/16/2017   ENUCLEATION Right 03/16/2017   Procedure: ENUCLEATION RIGHT EYE;  Surgeon: Loyd Kathryne Palm, MD;  Location: [facility] OR;  Service: Ophthalmology;  Laterality: Right;   EYE SURGERY     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6, 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   EYE SURGERY Right ~ 1974   S/P initial eye injury; scissors stuck in my eye   LAPAROSCOPIC CHOLECYSTECTOMY     SHOULDER ARTHROSCOPY WITH ROTATOR CUFF REPAIR Left    wire stitches per patient   SHOULDER ARTHROSCOPY WITH ROTATOR CUFF REPAIR Right     Current Medications: Current Facility-Administered Medications  Medication Dose Route  Frequency Provider Last Rate Last Admin   acetaminophen  (TYLENOL ) tablet 650 mg  650 mg Oral Q6H PRN Tex Drilling, NP   650 mg at 12/07/24 2126   alum & mag hydroxide-simeth (MAALOX/MYLANTA) 200-200-20 MG/5ML suspension 30 mL  30 mL Oral Q4H PRN Tex Drilling, NP       cloNIDine  (CATAPRES ) tablet 0.1 mg  0.1 mg Oral BID PRN Tex Drilling, NP       divalproex  (DEPAKOTE ) DR tablet 1,500 mg  1,500 mg Oral QHS Lilley Hubble, MD   1,500 mg at 12/07/24 2126   divalproex  (DEPAKOTE ) DR tablet 500 mg  500 mg Oral q AM Masen Luallen, MD   500 mg at 12/08/24 9192   losartan  (  COZAAR ) tablet 25 mg  25 mg Oral Daily Nkwenti, Doris, NP   25 mg at 12/08/24 0928   magnesium  hydroxide (MILK OF MAGNESIA) suspension 30 mL  30 mL Oral Daily PRN Tex Drilling, NP       menthol  (CEPACOL) lozenge 3 mg  1 lozenge Oral PRN Bobbitt, Shalon E, NP   3 mg at 12/08/24 0934   methimazole  (TAPAZOLE ) tablet 10 mg  10 mg Oral Daily Nkwenti, Doris, NP   10 mg at 12/08/24 9071   OLANZapine  (ZYPREXA ) injection 5 mg  5 mg Intramuscular TID PRN Tex Drilling, NP       OLANZapine  (ZYPREXA ) injection 5 mg  5 mg Intramuscular TID PRN Tex Drilling, NP       OLANZapine  (ZYPREXA ) tablet 20 mg  20 mg Oral QHS Jonathan Kirkendoll, MD   20 mg at 12/07/24 2127   OLANZapine  zydis (ZYPREXA ) disintegrating tablet 5 mg  5 mg Oral TID PRN Tex Drilling, NP       OLANZapine  zydis (ZYPREXA ) disintegrating tablet 5 mg  5 mg Oral TID PRN Tex Drilling, NP       paliperidone  (INVEGA ) 24 hr tablet 3 mg  3 mg Oral Daily Shrivastava, Aryendra, MD   3 mg at 12/08/24 9071   pantoprazole  (PROTONIX ) EC tablet 40 mg  40 mg Oral Daily Nkwenti, Doris, NP   40 mg at 12/08/24 9071   traZODone  (DESYREL ) tablet 50 mg  50 mg Oral QHS PRN Tex Drilling, NP   50 mg at 12/06/24 2109    Lab Results:  No results found for this or any previous visit (from the past 48 hours).       Blood Alcohol level:  Lab Results  Component Value Date   [facility] <15  10/14/2024   ETH <10 07/02/2022    Metabolic Disorder Labs: Lab Results  Component Value Date   HGBA1C 5.5 10/14/2024   MPG 111.15 10/14/2024   MPG 137 01/06/2022   Lab Results  Component Value Date   PROLACTIN 5.8 10/14/2024   Lab Results  Component Value Date   CHOL 167 10/14/2024   TRIG 68 10/14/2024   HDL 60 10/14/2024   CHOLHDL 2.8 10/14/2024   VLDL 14 10/14/2024   LDLCALC 93 10/14/2024   LDLCALC 73 03/13/2024    Physical Findings: AIMS:  , ,  ,  ,    CIWA:    COWS:      Psychiatric Specialty Exam:  Presentation  General Appearance:  Appropriate for Environment  Eye Contact: Fleeting  Speech: Normal Rate  Speech Volume: Decreased    Mood and Affect  Mood:fine  Affect: Flat   Thought Process  Thought Processes: impoverished  Orientation:Partial  Thought Content: Chronic delusions Hallucinations: Denies  Ideas of Reference:Delusions; Paranoia  Suicidal Thoughts: Denies  Homicidal Thoughts: Denies   Sensorium  Memory: Immediate Fair; Recent Fair  Judgment: Impaired  Insight: Shallow   Executive Functions  Concentration: Fair  Attention Span: Fair  Recall: Poor  Fund of Knowledge: Fair  Language: Fair   Psychomotor Activity  Psychomotor Activity: No data recorded  Musculoskeletal: Strength & Muscle Tone: within normal limits Gait & Station: normal Assets  Assets: Manufacturing Systems Engineer; Desire for Improvement; Social Support    Physical Exam: Physical Exam Vitals and nursing note reviewed.    ROS Blood pressure (!) 152/69, pulse 98, temperature 98.3 F (36.8 C), resp. rate 18, height 5' 8 (1.727 m), weight 48.3 kg, SpO2 100%. Body mass index is 16.19 kg/m.  Diagnosis: Principal Problem:   Schizophrenia, paranoid (HCC) Major neurocognitive disorder-slums total score of 13/30   Treatment Plan Summary: APS took legal guardianship and  working further placement at appropriate level of  care  Safety and Monitoring:             -- Involuntary admission to inpatient psychiatric unit for safety, stabilization and treatment             -- Daily contact with patient to assess and evaluate symptoms and progress in treatment             -- Patient's case to be discussed in multi-disciplinary team meeting             -- Observation Level: q15 minute checks             -- Vital signs:  q12 hours             -- Precautions: suicide, elopement, and assault   2. Psychiatric Diagnoses and Treatment:  Depakote  levels 11/20/2024-60 Invega  6 mg Depakote  500 mg every morning and 1500 mg  Zyprexa  20 mg nightly  -- The risks/benefits/side-effects/alternatives to this medication were discussed in detail with the patient and time was given for questions. The patient consents to medication trial.                -- Metabolic profile and EKG monitoring obtained while on an atypical antipsychotic (BMI: Lipid Panel: HbgA1c: QTc:)              -- Encouraged patient to participate in unit milieu and in scheduled group therapies   Occupational Therapy recommendations If plan is discharge home, recommend the following:   Direct supervision/assist for medications management;Direct supervision/assist for financial management;Assist for transportation;Assistance with cooking/housework    Ms Daffin was seen for OT treatment on this date. Upon arrival to room pt in dayroom, agreeable to tx. The SLUMS is a 30-point screening questionnaire that tests orientation, memory, attention, problem solving, and executive function. Pt scored a 13/30 indicating Dementia. Of note, it is not within occupational therapy scope of practice to diagnose cognitive impairments, this screen indicates need for further testing. Pt with noted impairments in memory and problem solving limiting ability to participate functionally in medication management, bill management, and safety cooking. Will continue to follow POC. Continue to  recommend SUPERVISION for safety with iADLs.    [facility] Mental Status Examination Orientation: 2/3  Calculations: 0/3 Naming animals: 2/3 Patient named 14 animals (0 points is 0-4 animals; 1 is 5-9 animals; 2 is 10-14 animals; 3 is 15+ animals) Recall: 3/5  Attention: 1/2 Clock drawing: 1/4 Visual Processing: 2/2 Paragraph Memory: 2/8  Total: 13/30;  Given that patient has completed high school, this score falls in the dementia range.   4. Discharge Planning:   -- Social work and case management to assist with discharge planning and identification of hospital follow-up needs prior to discharge  -- Estimated LOS: 3-4 days Bonifacio Pruden.MD

## 2025-01-09 NOTE — Group Note (Signed)
 Date:  01/09/2025 Time:  8:33 PM  Group Topic/Focus:  Making Healthy Choices:   The focus of this group is to help patients identify negative/unhealthy choices they were using prior to admission and identify positive/healthier coping strategies to replace them upon discharge.    Participation Level:  Active  Participation Quality:  Appropriate  Affect:  Appropriate  Cognitive:  Appropriate  Insight: Good  Engagement in Group:  Engaged  Modes of Intervention:  Discussion  Additional Comments:    Shari [name] 01/09/2025, 8:33 PM

## 2025-01-09 NOTE — BHH Counselor (Signed)
 CSW received call from Hydia at [facility] on Monday 01/08/25 reporting that she would be able to do facetime screening with pt.   CSW was made aware on Tuesday 01/10/25 that a patient that CSW previously discharged to [facility] was announced missing and that there was currently a Silver Alert out for pt.   CSW expressed concerns about placing pt in [facility] as CSW already had another pt on the unit at this time who also eloped from [facility] and was brought in by [facility] under IVC.   CSW expressed concerns to pt's DSS caseworker [name], and CSW also expressed concerns to CSW's supervisor Massie Silvius about pt being placed with [facility].   CSW told Arch that the interview with [facility] could still occur but that there are some concerns with placeing pt there.   CSW met with pt, RN case manager Alyson, Lakita Moore (DSS) caseworker and completed a facetime screening with Hydia at [facility] on 01/10/25.   During the phone screening pt expressed that she did NOT want to go to an assisted living facility, that she does NOT want a legal guardian and that she wants a place of her own.   CSW, RN Case Manager and [name] met following the meeting with pt. CSW, RN Case Manager and [name] agree that pt needs to be in a LOCKED assisted living facility because pt is already expressing signs of being at risk for eloping. To protect pt's safety it would be important for pt to be in a locked facility that is monitored by staff.   At this time [facility] is NOT an appropriate placement for pt as they are not a locked facility and pt may elope, especially given pt's hx of delusions regarding her owning a home that is currently a transitional house and is occupied by other tennants.   DSS caseworker agreeable, placement search continues at this time .    Lum Croft, MSW, [place] 01/09/2025 11:21 AM

## 2025-01-09 NOTE — Group Note (Signed)
 Recreation Therapy Group Note   Group Topic:Animal Assisted Therapy   Group Date: 01/09/2025 Start Time: 1015 End Time: 1040 Facilitators: Celestia Jeoffrey BRAVO, LRT, CTRS Location: Dayroom  Group Description: AAA. Animal-Assisted Activity provides opportunities for motivational, educational, therapeutic and/or recreational benefits to enhance quality of life. [name] and [name] visited the unit to interact with patients.   Goal Areas Addressed:  Reduced anxiety and stress Improved mood Increased social interaction Enhanced communication skills Reduced loneliness and isolation Improved emotional regulation   Affect/Mood: N/A   Participation Level: Did not attend    Clinical Observations/Individualized Feedback: Patient did not attend.  Plan: Continue to engage patient in RT group sessions 2-3x/week.   Jeoffrey BRAVO Celestia, LRT, CTRS 01/09/2025 11:23 AM

## 2025-01-09 NOTE — Plan of Care (Signed)

## 2025-01-09 NOTE — Group Note (Signed)
 Date:  01/09/2025 Time:  3:31 PM  Group Topic/Focus:  Making Healthy Choices:   The focus of this group is to help patients identify negative/unhealthy choices they were using prior to admission and identify positive/healthier coping strategies to replace them upon discharge.    Participation Level:  Did Not Attend   Shari [name] 01/09/2025, 3:31 PM

## 2025-01-09 NOTE — Group Note (Signed)
 LCSW Group Therapy Note  Group Date: 01/09/2025 Start Time: 1315 End Time: 1345   Type of Therapy and Topic:  Group Therapy - Healthy vs Unhealthy Coping Skills  Participation Level:  Active   Description of Group The focus of this group was to determine what unhealthy coping techniques typically are used by group members and what healthy coping techniques would be helpful in coping with various problems. Patients were guided in becoming aware of the differences between healthy and unhealthy coping techniques. Patients were asked to identify 2-3 healthy coping skills they would like to learn to use more effectively.  Therapeutic Goals Patients learned that coping is what human beings do all day long to deal with various situations in their lives Patients defined and discussed healthy vs unhealthy coping techniques Patients identified their preferred coping techniques and identified whether these were healthy or unhealthy Patients determined 2-3 healthy coping skills they would like to become more familiar with and use more often. Patients provided support and ideas to each other   Summary of Patient Progress: Patient proved open to input from peers and feedback from CSW. Patient demonstrated fair insight into the subject matter, was respectful of peers, and participated throughout the entire session.   Therapeutic Modalities Cognitive Behavioral Therapy Motivational Interviewing  Shari [name], LCSWA 01/09/2025  2:14 PM

## 2025-01-09 NOTE — Progress Notes (Signed)
" °   01/09/25 2146  Psych Admission Type (Psych Patients Only)  Admission Status Involuntary  Psychosocial Assessment  Patient Complaints None  Eye Contact Fair  Facial Expression Flat  Affect Appropriate to circumstance  Speech Soft  Interaction Minimal  Motor Activity Slow  Appearance/Hygiene Unremarkable  Behavior Characteristics Appropriate to situation;Cooperative  Mood Pleasant  Thought Process  Coherency WDL  Content WDL  Delusions None reported or observed  Perception WDL  Hallucination None reported or observed  Judgment Limited  Confusion None  Danger to Self  Current suicidal ideation? Denies  Self-Injurious Behavior No self-injurious ideation or behavior indicators observed or expressed   Agreement Not to Harm Self Yes  Description of Agreement verbal  Danger to Others  Danger to Others None reported or observed    "

## 2025-01-10 NOTE — BHH Group Notes (Signed)
 Spirituality Group   Description: Participant directed exploration of values, beliefs and meaning   **Focus on Gratitude: Invite reflection on sources of gratitude (external/internal); goal to invite internal gratitude to foster 1) reconnection with life-giving activities 2) self-compassion.   Following a brief framework of chaplains role and ground rules of group behavior, participants are invited to share concerns or questions that engage spiritual life. Emphasis placed on common themes and shared experiences and ways to make meaning and clarify living into ones values.   Theory/Process/Goal: Utilize the theoretical framework of group therapy established by Celena Kite, Relational Cultural Theory and Rogerian approaches to facilitate relational empathy and use of the here and now to foster reflection, self-awareness, and sharing.   Observations: Did not attend  Lekeya Rollings L. Delores HERO.Div

## 2025-01-10 NOTE — Plan of Care (Signed)
  Problem: Education: Goal: Emotional status will improve Outcome: Not Progressing Goal: Mental status will improve Outcome: Not Progressing Goal: Verbalization of understanding the information provided will improve Outcome: Not Progressing   Problem: Coping: Goal: Ability to verbalize frustrations and anger appropriately will improve Outcome: Not Progressing

## 2025-01-10 NOTE — Progress Notes (Signed)
 [facility] MD Progress Note  Patient is a 69 year old female with a history of Schizoaffective Disorder, Bipolar Type who presents via [facility] under IVC, initiated by CM, Falencio with re-entry program, to [facility] for assessment. Per IVC, [name] has been diagnosed with Schizophrenia and is refusing to take medication. [name] isn't sleeping. Residents report her screaming and yelling all night long.  [name] responding to internal stimuli.  [name] verbally aggressive.  The respondent has locked all of the residents inside of the transitional housing home.  They could not leave the home which prompted the IVC.  Residents did not have access to food, the restroom or anything as a result of being locked in.  [name] is currently under Adult protective services care. Patient is admitted to [facility] unit with Q15 min safety monitoring. Multidisciplinary team approach is offered. Medication management; group/milieu therapy is offered.   The patient remains psychiatrically stable at this time. [name] awaiting placement, and continued inpatient care is medically necessary primarily due to disposition considerations rather than acute psychiatric decompensation.  Subjective:  Chart reviewed, case discussed in multidisciplinary meeting, patient seen during rounds.   Patient is noted to be resting in bed.  She remains concrete and delusional about not needing alternate placement and demanding that she needs to go back to the same place which she thinks is her apartment.  Patient has no behavioral problems on the unit.  [name] taking her medications with no reported side effects.  Patient denies SI/HI/plan and denies hallucinations.  Psychiatric History: see h&P Family History:  Family History  Problem Relation Age of Onset   Diabetes Mother    CAD Mother    Lung cancer Father    Social History:  Social History   Substance and Sexual Activity  Alcohol Use Yes   Comment: beer  occasionally     Social History   Substance and Sexual Activity  Drug Use No    Social History   Socioeconomic History   Marital status: Widowed    Spouse name: Not on file   Number of children: Not on file   Years of education: Not on file   Highest education level: Not on file  Occupational History   Not on file  Tobacco Use   Smoking status: Every Day    Current packs/day: 0.50    Average packs/day: 0.5 packs/day for 40.0 years (20.0 ttl pk-yrs)    Types: Cigarettes   Smokeless tobacco: Never  Vaping Use   Vaping status: Never Used  Substance and Sexual Activity   Alcohol use: Yes    Comment: beer occasionally   Drug use: No   Sexual activity: Not Currently    Birth control/protection: Post-menopausal  Other Topics Concern   Not on file  Social History Narrative   Not on file   Social Drivers of Health   Tobacco Use: High Risk (11/02/2024)   Patient History    Smoking Tobacco Use: Every Day    Smokeless Tobacco Use: Never    Passive Exposure: Not on file  Financial Resource Strain: Not on file  Food Insecurity: No Food Insecurity (10/17/2024)   Epic    Worried About Programme Researcher, Broadcasting/film/video in the Last Year: Never true    Ran Out of Food in the Last Year: Never true  Transportation Needs: No Transportation Needs (10/17/2024)   Epic    Lack of Transportation (Medical): No    Lack of Transportation (Non-Medical): No  Recent Concern: Transportation Needs - Unmet Transportation Needs (10/14/2024)   Epic    Lack of Transportation (Medical): Yes    Lack of Transportation (Non-Medical): Yes  Physical Activity: Not on file  Stress: Not on file  Social Connections: Unknown (10/17/2024)   Social Connection and Isolation Panel    Frequency of Communication with Friends and Family: Patient unable to answer    Frequency of Social Gatherings with Friends and Family: Patient unable to answer    Attends Religious Services: Patient unable to answer    Active Member of  Clubs or Organizations: Patient declined    Attends Banker Meetings: Patient unable to answer    Marital Status: Widowed  Depression (PHQ2-9): Not on file  Alcohol Screen: Low Risk (10/17/2024)   Alcohol Screen    Last Alcohol Screening Score (AUDIT): 0  Housing: Low Risk (10/17/2024)   Epic    Unable to Pay for Housing in the Last Year: No    Number of Times Moved in the Last Year: 0    Homeless in the Last Year: No  Utilities: Not At Risk (10/17/2024)   Epic    Threatened with loss of utilities: No  Health Literacy: Not on file   Past Medical History:  Past Medical History:  Diagnosis Date   Eye globe prosthesis    GERD (gastroesophageal reflux disease)    History of blood transfusion 1973   related to abscess burst in my stomach   Hyperlipemia    Hyperlipidemia    Hypertension    Osteoarthritis of back    Lowerback    SVD (spontaneous vaginal delivery)    x 1, baby died at 2 wks of age   Type II diabetes mellitus (HCC)     Past Surgical History:  Procedure Laterality Date   APPENDECTOMY     COLONOSCOPY     DILATATION & CURETTAGE/HYSTEROSCOPY WITH MYOSURE N/A 02/25/2015   Procedure: DILATATION & CURETTAGE/HYSTEROSCOPY WITH MYOSURE;  Surgeon: Truman Corona, MD;  Location: [facility];  Service: Gynecology;  Laterality: N/A;   DILATION AND CURETTAGE OF UTERUS     ENUCLEATION Right 03/16/2017   ENUCLEATION Right 03/16/2017   Procedure: ENUCLEATION RIGHT EYE;  Surgeon: Loyd Kathryne Palm, MD;  Location: [facility] OR;  Service: Ophthalmology;  Laterality: Right;   EYE SURGERY     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6, 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   EYE SURGERY Right ~ 1974   S/P initial eye injury; scissors stuck in my eye   LAPAROSCOPIC CHOLECYSTECTOMY     SHOULDER ARTHROSCOPY WITH ROTATOR CUFF REPAIR Left    wire stitches per patient   SHOULDER ARTHROSCOPY WITH ROTATOR CUFF REPAIR Right     Current Medications: Current Facility-Administered Medications  Medication Dose Route  Frequency Provider Last Rate Last Admin   acetaminophen  (TYLENOL ) tablet 650 mg  650 mg Oral Q6H PRN Tex Drilling, NP   650 mg at 12/07/24 2126   alum & mag hydroxide-simeth (MAALOX/MYLANTA) 200-200-20 MG/5ML suspension 30 mL  30 mL Oral Q4H PRN Nkwenti, Drilling, NP       cloNIDine  (CATAPRES ) tablet 0.1 mg  0.1 mg Oral BID PRN Tex Drilling, NP       divalproex  (DEPAKOTE ) DR tablet 1,500 mg  1,500 mg Oral QHS Toretto Tingler, MD   1,500 mg at 12/07/24 2126   divalproex  (DEPAKOTE ) DR tablet 500 mg  500 mg Oral q AM Tane Biegler, MD   500 mg at 12/08/24 0807   losartan  (COZAAR ) tablet  25 mg  25 mg Oral Daily Tex Drilling, NP   25 mg at 12/08/24 9071   magnesium  hydroxide (MILK OF MAGNESIA) suspension 30 mL  30 mL Oral Daily PRN Tex Drilling, NP       menthol  (CEPACOL) lozenge 3 mg  1 lozenge Oral PRN Bobbitt, Shalon E, NP   3 mg at 12/08/24 9065   methimazole  (TAPAZOLE ) tablet 10 mg  10 mg Oral Daily Nkwenti, Doris, NP   10 mg at 12/08/24 9071   OLANZapine  (ZYPREXA ) injection 5 mg  5 mg Intramuscular TID PRN Tex Drilling, NP       OLANZapine  (ZYPREXA ) injection 5 mg  5 mg Intramuscular TID PRN Tex Drilling, NP       OLANZapine  (ZYPREXA ) tablet 20 mg  20 mg Oral QHS Nussen Pullin, MD   20 mg at 12/07/24 2127   OLANZapine  zydis (ZYPREXA ) disintegrating tablet 5 mg  5 mg Oral TID PRN Tex Drilling, NP       OLANZapine  zydis (ZYPREXA ) disintegrating tablet 5 mg  5 mg Oral TID PRN Tex Drilling, NP       paliperidone  (INVEGA ) 24 hr tablet 3 mg  3 mg Oral Daily Shrivastava, Aryendra, MD   3 mg at 12/08/24 9071   pantoprazole  (PROTONIX ) EC tablet 40 mg  40 mg Oral Daily Nkwenti, Doris, NP   40 mg at 12/08/24 9071   traZODone  (DESYREL ) tablet 50 mg  50 mg Oral QHS PRN Tex Drilling, NP   50 mg at 12/06/24 2109    Lab Results:  No results found for this or any previous visit (from the past 48 hours).       Blood Alcohol level:  Lab Results  Component Value Date   [facility] <15  10/14/2024   ETH <10 07/02/2022    Metabolic Disorder Labs: Lab Results  Component Value Date   HGBA1C 5.5 10/14/2024   MPG 111.15 10/14/2024   MPG 137 01/06/2022   Lab Results  Component Value Date   PROLACTIN 5.8 10/14/2024   Lab Results  Component Value Date   CHOL 167 10/14/2024   TRIG 68 10/14/2024   HDL 60 10/14/2024   CHOLHDL 2.8 10/14/2024   VLDL 14 10/14/2024   LDLCALC 93 10/14/2024   LDLCALC 73 03/13/2024    Physical Findings: AIMS:  , ,  ,  ,    CIWA:    COWS:      Psychiatric Specialty Exam:  Presentation  General Appearance:  Appropriate for Environment  Eye Contact: Fleeting  Speech: Normal Rate  Speech Volume: Decreased    Mood and Affect  Mood:fine  Affect: Flat   Thought Process  Thought Processes: impoverished  Orientation:Partial  Thought Content: Chronic delusions Hallucinations: Denies  Ideas of Reference:Delusions; Paranoia  Suicidal Thoughts: Denies  Homicidal Thoughts: Denies   Sensorium  Memory: Immediate Fair; Recent Fair  Judgment: Impaired  Insight: Shallow   Executive Functions  Concentration: Fair  Attention Span: Fair  Recall: Poor  Fund of Knowledge: Fair  Language: Fair   Psychomotor Activity  Psychomotor Activity: No data recorded  Musculoskeletal: Strength & Muscle Tone: within normal limits Gait & Station: normal Assets  Assets: Manufacturing Systems Engineer; Desire for Improvement; Social Support    Physical Exam: Physical Exam Vitals and nursing note reviewed.    ROS Blood pressure (!) 152/69, pulse 98, temperature 98.3 F (36.8 C), resp. rate 18, height 5' 8 (1.727 m), weight 48.3 kg, SpO2 100%. Body mass index is 16.19 kg/m.  Diagnosis:  Principal Problem:   Schizophrenia, paranoid (HCC) Major neurocognitive disorder-slums total score of 13/30   Treatment Plan Summary: APS took legal guardianship and  working further placement at appropriate level of  care  Safety and Monitoring:             -- Involuntary admission to inpatient psychiatric unit for safety, stabilization and treatment             -- Daily contact with patient to assess and evaluate symptoms and progress in treatment             -- Patient's case to be discussed in multi-disciplinary team meeting             -- Observation Level: q15 minute checks             -- Vital signs:  q12 hours             -- Precautions: suicide, elopement, and assault   2. Psychiatric Diagnoses and Treatment:  Depakote  levels 11/20/2024-60 Invega  6 mg Depakote  500 mg every morning and 1500 mg  Zyprexa  20 mg nightly  -- The risks/benefits/side-effects/alternatives to this medication were discussed in detail with the patient and time was given for questions. The patient consents to medication trial.                -- Metabolic profile and EKG monitoring obtained while on an atypical antipsychotic (BMI: Lipid Panel: HbgA1c: QTc:)              -- Encouraged patient to participate in unit milieu and in scheduled group therapies   Occupational Therapy recommendations If plan is discharge home, recommend the following:   Direct supervision/assist for medications management;Direct supervision/assist for financial management;Assist for transportation;Assistance with cooking/housework    Ms Filip was seen for OT treatment on this date. Upon arrival to room pt in dayroom, agreeable to tx. The SLUMS is a 30-point screening questionnaire that tests orientation, memory, attention, problem solving, and executive function. Pt scored a 13/30 indicating Dementia. Of note, it is not within occupational therapy scope of practice to diagnose cognitive impairments, this screen indicates need for further testing. Pt with noted impairments in memory and problem solving limiting ability to participate functionally in medication management, bill management, and safety cooking. Will continue to follow POC. Continue to  recommend SUPERVISION for safety with iADLs.    [facility] Mental Status Examination Orientation: 2/3  Calculations: 0/3 Naming animals: 2/3 Patient named 14 animals (0 points is 0-4 animals; 1 is 5-9 animals; 2 is 10-14 animals; 3 is 15+ animals) Recall: 3/5  Attention: 1/2 Clock drawing: 1/4 Visual Processing: 2/2 Paragraph Memory: 2/8  Total: 13/30;  Given that patient has completed high school, this score falls in the dementia range.   4. Discharge Planning:   -- Social work and case management to assist with discharge planning and identification of hospital follow-up needs prior to discharge  -- Estimated LOS: 3-4 days [email or web address] [email or web address]

## 2025-01-10 NOTE — Group Note (Signed)
 Date:  01/10/2025 Time:  11:35 AM  Group Topic/Focus:  Making Healthy Choices:   The focus of this group is to help patients identify negative/unhealthy choices they were using prior to admission and identify positive/healthier coping strategies to replace them upon discharge.    Participation Level:  Active  Participation Quality:  Appropriate  Affect:  Appropriate  Cognitive:  Appropriate  Insight: Appropriate  Engagement in Group:  Engaged  Modes of Intervention:  Discussion   Shari [name] 01/10/2025, 11:35 AM

## 2025-01-10 NOTE — Group Note (Signed)
 Date:  01/10/2025 Time:  9:28 PM  Group Topic/Focus:  Overcoming Stress:   The focus of this group is to define stress and help patients assess their triggers.    Participation Level:  Active  Participation Quality:  Appropriate  Affect:  Appropriate  Cognitive:  Appropriate  Insight: Good  Engagement in Group:  Engaged  Modes of Intervention:  Discussion  Additional Comments:    Romero Earnie Hope 01/10/2025, 9:28 PM

## 2025-01-10 NOTE — Group Note (Signed)
 Recreation Therapy Group Note   Group Topic:Emotion Expression  Group Date: 01/10/2025 Start Time: 1400 End Time: 1450 Facilitators: Celestia Jeoffrey BRAVO, LRT, CTRS Location: Dayroom  Group Description: Patients will engage in a guided art activity focused on painting a personal peaceful place. This group encourages relaxation, self-expression, and mindfulness through creative exploration. Participants are invited to imagine a location where they feel calm, safe, or content and represent it using paint and other art materials. Emphasis is placed on the process rather than artistic skill, allowing individuals to express emotions, reduce stress, and increase self-awareness in a supportive group setting.  Goal Area(s): Promote relaxation and stress reduction Enhance emotional expression and self-awareness Support coping skills and grounding techniques Encourage creativity and positive leisure engagement [place] interaction and group participation   Affect/Mood: N/A   Participation Level: Did not attend    Clinical Observations/Individualized Feedback: Patient did not attend.  Plan: Continue to engage patient in RT group sessions 2-3x/week.   Jeoffrey BRAVO Celestia, LRT, CTRS 01/10/2025 4:39 PM

## 2025-01-10 NOTE — Progress Notes (Signed)
 Patient pleasant and responsive during interaction. Able to make needs known. Patient accepted medications without issue. Denies SI/HI/AVH. No PRNs given. Ongoing monitoring continues.   01/10/25 1000  Psych Admission Type (Psych Patients Only)  Admission Status Involuntary  Psychosocial Assessment  Patient Complaints None  Eye Contact Fair  Facial Expression Flat  Affect Appropriate to circumstance  Speech Soft  Interaction Minimal  Motor Activity Slow  Appearance/Hygiene Unremarkable  Behavior Characteristics Cooperative;Appropriate to situation  Mood Pleasant  Thought Process  Coherency WDL  Content WDL  Delusions None reported or observed  Perception WDL  Hallucination None reported or observed  Judgment Limited  Confusion None  Danger to Self  Current suicidal ideation? Denies  Agreement Not to Harm Self Yes  Description of Agreement verbal  Danger to Others  Danger to Others None reported or observed

## 2025-01-10 NOTE — Group Note (Signed)
 Physical/Occupational Therapy Group Note  Group Topic: UE Therex   Group Date: 01/10/2025 Start Time: 1305 End Time: 1335 Facilitators: Clive Warren CROME, OT   Group Description: Group instructed in series of upper extremities exercises, aimed to promote strength, flexibility, range of motion and functional endurance.  Patients provided cuing for proper mechanics and proper pace of exercise; exercises adjusted as necessary for individualized patient needs.  Patient also engaged in cognitive components throughout session, working to integrate attention to task, command following, turn-taking and appropriate social interaction throughout session.  Allowed to ask questions as appropriate, and encouraged to identify specific exercises that they could complete independently outside of group sessions.  Therapeutic Goal(s):  Demonstrate appropriate performance of upper extremity exercises to promote strength, flexibility, range of motion and functional endurance Identify 2-3 specific upper extremity exercises to complete as home exercise program outside of group session  Individual Participation: Pt did not attend.  Participation Level: Did not attend   Participation Quality:   Behavior:   Speech/Thought Process:   Affect/Mood:   Insight:   Judgement:   Modes of Intervention:   Patient Response to Interventions:    Plan: Continue to engage patient in PT/OT groups 1 - 2x/week.   Maximilliano Kersh R., MPH, MS, OTR/L ascom [phone] 01/10/25, 2:19 PM

## 2025-01-10 NOTE — BH IP Treatment Plan (Signed)
 Interdisciplinary Treatment and Diagnostic Plan Update  01/10/2025 Time of Session: 9:30 AM  Shari [name] MRN: [id number]  Principal Diagnosis: Schizophrenia, paranoid (HCC)  Secondary Diagnoses: Principal Problem:   Schizophrenia, paranoid (HCC)   Current Medications:  Current Facility-Administered Medications  Medication Dose Route Frequency Provider Last Rate Last Admin   acetaminophen  (TYLENOL ) tablet 650 mg  650 mg Oral Q6H PRN Tex Drilling, NP   650 mg at 01/09/25 2119   alum & mag hydroxide-simeth (MAALOX/MYLANTA) 200-200-20 MG/5ML suspension 30 mL  30 mL Oral Q4H PRN Tex Drilling, NP       cloNIDine  (CATAPRES ) tablet 0.1 mg  0.1 mg Oral BID PRN Tex Drilling, NP       diclofenac  Sodium (VOLTAREN ) 1 % topical gel 4 g  4 g Topical QID PRN Jadapalle, Sree, MD       divalproex  (DEPAKOTE ) DR tablet 1,500 mg  1,500 mg Oral QHS Jadapalle, Sree, MD   1,500 mg at 01/09/25 2119   divalproex  (DEPAKOTE ) DR tablet 500 mg  500 mg Oral q AM Jadapalle, Sree, MD   500 mg at 01/10/25 0745   famotidine  (PEPCID ) 40 MG/5ML suspension 40 mg  40 mg Oral QHS Jadapalle, Sree, MD   40 mg at 01/09/25 2120   feeding supplement (ENSURE PLUS HIGH PROTEIN) liquid 237 mL  237 mL Oral BID BM Jadapalle, Sree, MD   237 mL at 01/10/25 9076   lidocaine  (LIDODERM ) 5 % 1 patch  1 patch Transdermal Daily PRN Jadapalle, Sree, MD       losartan  (COZAAR ) tablet 25 mg  25 mg Oral Daily Nkwenti, Doris, NP   25 mg at 01/10/25 9076   magnesium  hydroxide (MILK OF MAGNESIA) suspension 30 mL  30 mL Oral Daily PRN Tex Drilling, NP       menthol  (CEPACOL) lozenge 3 mg  1 lozenge Oral PRN Bobbitt, Shalon E, NP   3 mg at 01/09/25 2122   methimazole  (TAPAZOLE ) tablet 10 mg  10 mg Oral Daily Nkwenti, Doris, NP   10 mg at 01/10/25 9076   multivitamin with minerals tablet 1 tablet  1 tablet Oral Daily Jadapalle, Sree, MD   1 tablet at 01/10/25 9076   OLANZapine  (ZYPREXA ) injection 5 mg  5 mg Intramuscular TID PRN Tex Drilling,  NP       OLANZapine  (ZYPREXA ) injection 5 mg  5 mg Intramuscular TID PRN Tex Drilling, NP       OLANZapine  (ZYPREXA ) tablet 20 mg  20 mg Oral QHS Jadapalle, Sree, MD   20 mg at 01/09/25 2119   OLANZapine  zydis (ZYPREXA ) disintegrating tablet 5 mg  5 mg Oral TID PRN Tex Drilling, NP       OLANZapine  zydis (ZYPREXA ) disintegrating tablet 5 mg  5 mg Oral TID PRN Tex Drilling, NP       paliperidone  (INVEGA ) 24 hr tablet 6 mg  6 mg Oral Daily Jadapalle, Sree, MD   6 mg at 01/10/25 9076   pantoprazole  (PROTONIX ) EC tablet 40 mg  40 mg Oral BID AC Zhang, Ping T, MD   40 mg at 01/10/25 0745   sucralfate  (CARAFATE ) 1 GM/10ML suspension 1 g  1 g Oral TID WC & HS Laurita Manor T, MD   1 g at 01/10/25 0745   traZODone  (DESYREL ) tablet 50 mg  50 mg Oral QHS PRN Tex Drilling, NP   50 mg at 01/09/25 2119   PTA Medications: Medications Prior to Admission  Medication Sig Dispense Refill Last Dose/Taking  Ascorbic Acid (VITAMIN C PO) Take 1 tablet by mouth daily. (Patient not taking: Reported on 10/14/2024)      divalproex  (DEPAKOTE ) 500 MG DR tablet Take 1 tablet (500 mg total) by mouth at bedtime. (Patient not taking: Reported on 10/14/2024) 30 tablet 1    losartan  (COZAAR ) 25 MG tablet Take 1 tablet (25 mg total) by mouth daily. (Patient not taking: Reported on 10/14/2024) 90 tablet 1    methimazole  (TAPAZOLE ) 10 MG tablet Take 1 tablet (10 mg total) by mouth daily. (Patient not taking: Reported on 10/14/2024) 90 tablet 1    OLANZapine  (ZYPREXA ) 10 MG tablet Take 1 tablet (10 mg total) by mouth at bedtime. (Patient not taking: Reported on 10/14/2024) 30 tablet 1    pantoprazole  (PROTONIX ) 40 MG tablet Take 1 tablet (40 mg total) by mouth daily. (Patient not taking: Reported on 10/14/2024) 90 tablet 0    propranolol  (INDERAL ) 10 MG tablet Take 1 tablet (10 mg total) by mouth 2 (two) times daily. (Patient not taking: Reported on 10/14/2024) 180 tablet 1    VITAMIN D PO Take 1 tablet by mouth daily.  (Patient not taking: Reported on 10/14/2024)      VITAMIN E PO Take 1 tablet by mouth daily. (Patient not taking: Reported on 10/14/2024)       Patient Stressors: Medication change or noncompliance    Patient Strengths: Communication skills   Treatment Modalities: Medication Management, Group therapy, Case management,  1 to 1 session with clinician, Psychoeducation, Recreational therapy.   Physician Treatment Plan for Primary Diagnosis: Schizophrenia, paranoid (HCC) Long Term Goal(s): Improvement in symptoms so as ready for discharge   Short Term Goals: Ability to identify changes in lifestyle to reduce recurrence of condition will improve Ability to verbalize feelings will improve Ability to disclose and discuss suicidal ideas Ability to demonstrate self-control will improve Ability to identify and develop effective coping behaviors will improve Ability to maintain clinical measurements within normal limits will improve  Medication Management: Evaluate patient's response, side effects, and tolerance of medication regimen.  Therapeutic Interventions: 1 to 1 sessions, Unit Group sessions and Medication administration.  Evaluation of Outcomes: Adequate for Discharge  Physician Treatment Plan for Secondary Diagnosis: Principal Problem:   Schizophrenia, paranoid (HCC)  Long Term Goal(s): Improvement in symptoms so as ready for discharge   Short Term Goals: Ability to identify changes in lifestyle to reduce recurrence of condition will improve Ability to verbalize feelings will improve Ability to disclose and discuss suicidal ideas Ability to demonstrate self-control will improve Ability to identify and develop effective coping behaviors will improve Ability to maintain clinical measurements within normal limits will improve     Medication Management: Evaluate patient's response, side effects, and tolerance of medication regimen.  Therapeutic Interventions: 1 to 1 sessions, Unit  Group sessions and Medication administration.  Evaluation of Outcomes: Adequate for Discharge   RN Treatment Plan for Primary Diagnosis: Schizophrenia, paranoid (HCC) Long Term Goal(s): Knowledge of disease and therapeutic regimen to maintain health will improve  Short Term Goals: Ability to remain free from injury will improve, Ability to verbalize frustration and anger appropriately will improve, Ability to demonstrate self-control, Ability to participate in decision making will improve, Ability to verbalize feelings will improve, Ability to disclose and discuss suicidal ideas, Ability to identify and develop effective coping behaviors will improve, and Compliance with prescribed medications will improve  Medication Management: RN will administer medications as ordered by provider, will assess and evaluate patient's response and provide education to  patient for prescribed medication. RN will report any adverse and/or side effects to prescribing provider.  Therapeutic Interventions: 1 on 1 counseling sessions, Psychoeducation, Medication administration, Evaluate responses to treatment, Monitor vital signs and CBGs as ordered, Perform/monitor CIWA, COWS, AIMS and Fall Risk screenings as ordered, Perform wound care treatments as ordered.  Evaluation of Outcomes: Adequate for Discharge   LCSW Treatment Plan for Primary Diagnosis: Schizophrenia, paranoid (HCC) Long Term Goal(s): Safe transition to appropriate next level of care at discharge, Engage patient in therapeutic group addressing interpersonal concerns.  Short Term Goals: Engage patient in aftercare planning with referrals and resources, Increase social support, Increase ability to appropriately verbalize feelings, Increase emotional regulation, Facilitate acceptance of mental health diagnosis and concerns, Facilitate patient progression through stages of change regarding substance use diagnoses and concerns, Identify triggers associated  with mental health/substance abuse issues, and Increase skills for wellness and recovery  Therapeutic Interventions: Assess for all discharge needs, 1 to 1 time with Social worker, Explore available resources and support systems, Assess for adequacy in community support network, Educate family and significant other(s) on suicide prevention, Complete Psychosocial Assessment, Interpersonal group therapy.  Evaluation of Outcomes: Adequate for Discharge   Progress in Treatment: Attending groups: No. Participating in groups: No. Taking medication as prescribed: Yes. Toleration medication: Yes. Family/Significant other contact made: Yes, DSS caseworker, [name], [phone] Patient understands diagnosis: No. Discussing patient identified problems/goals with staff: Yes. Medical problems stabilized or resolved: Yes. Denies suicidal/homicidal ideation: Yes. Issues/concerns per patient self-inventory: No. Other: None   New problem(s) identified: 12/14/24 no new changes  Update 01/05/2025: No changes at this time. Update 01/10/25: No changes at this time    New Short Term/Long Term Goal(s):Update 12/14/2024 no new changes  Update 12/19/2024: No changes at this time.  Update 12/25/2023: No changes at this time. Update 12/31/2023: No changes at this time.  Update 01/05/2025: No changes at this time.  Update 01/10/25: No changes at this time    Patient Goals:    I haven't set any goals for the hospital yet. I don't need psychiatric help 10/23/24 Update: No changes at this time. 10/28/24 Update: No changes at this time. Update 11/03/24: No changes at this time Update 11/09/24: No changes at this time Update 11/14/24: No changes at this time2/21/25 Update: No changes at this time. Update 11/29/2024: No changes at this time.   Update 12/04/2024: No changes at this time. Update 12/09/2024: No changes at this time.   12/14/24 no new changes  Update 12/19/2024: No changes at this time. Update 12/25/2023: No changes  at this time. Update 12/31/2023: No changes at this time.  Update 01/05/2025: No changes at this time.  Update 01/10/25: No changes at this time    Discharge Plan or Barriers: CSW will assist with appropriate discharge plan 10/23/24 Update: No changes at this time. 10/28/24 Update: CSW to continue to meet with DSS to engage in safe discharge planning and connect patient with appropriate resources. Update 11/03/24: No changes at this time Update 11/09/24: No changes at this time Update 11/14/24: APS drafting a petition for guardianship, awaiting updates regarding scheduled court date at this time. APS has received FL2 to begin placement search. 11/24/24 Update: No changes at this time.  Update 11/29/2024: Placement continues to be sought at this time.  Update 12/04/2024: No updates on placement search at this time Update 12/09/2024: No changes at this time.  Update:12/14/24 no new changes  Update 12/19/2024: No changes at this time. Update 12/25/2023: APS  continues to look for placement for patient. No plan identified. Update 12/31/2023: No changes at this time.   Update 01/05/2025: No changes at this time.  Update 01/10/25: Pt had interview for a placement [facility]) but would be inappropriate to place there due to it not being a locked facility and there is a concern of pt eloping. APS continues to look for appropriate placement at this time. Pt has IVC court hearing on 01/22/25.      Reason for Continuation of Hospitalization: Delusions  12/14/24 no changes   Estimated Length of Stay: Update 12/14/2024: TBD  Update 12/19/2024: TBD Update 12/25/2023: TBD Update 12/31/2023: TBD  Update 01/05/2025: TBD  Update 01/10/25: TBD Last 3 [place] Suicide Severity Risk Score: Flowsheet Row Admission (Current) from 10/17/2024 in [facility] from 10/14/2024 in [facility] ED to Hosp-Admission (Discharged) from 03/13/2024 in [place] [facility]  C-SSRS RISK CATEGORY  No Risk No Risk No Risk    Last PHQ 2/9 Scores:     No data to display          Scribe for Treatment Team: [name], [place] 01/10/2025 11:27 AM
